# Patient Record
Sex: Male | Born: 1955
Health system: Southern US, Community
[De-identification: ages and names within clinical notes are randomized; demographics above are authoritative.]

## PROBLEM LIST (undated history)

## (undated) DIAGNOSIS — K56699 Other intestinal obstruction unspecified as to partial versus complete obstruction: Secondary | ICD-10-CM

## (undated) DIAGNOSIS — I471 Supraventricular tachycardia, unspecified: Secondary | ICD-10-CM

## (undated) DIAGNOSIS — I2510 Atherosclerotic heart disease of native coronary artery without angina pectoris: Secondary | ICD-10-CM

## (undated) DIAGNOSIS — Z973 Presence of spectacles and contact lenses: Secondary | ICD-10-CM

## (undated) DIAGNOSIS — Z8701 Personal history of pneumonia (recurrent): Secondary | ICD-10-CM

## (undated) DIAGNOSIS — K5790 Diverticulosis of intestine, part unspecified, without perforation or abscess without bleeding: Secondary | ICD-10-CM

## (undated) DIAGNOSIS — D126 Benign neoplasm of colon, unspecified: Secondary | ICD-10-CM

## (undated) DIAGNOSIS — J189 Pneumonia, unspecified organism: Secondary | ICD-10-CM

## (undated) DIAGNOSIS — M1990 Unspecified osteoarthritis, unspecified site: Secondary | ICD-10-CM

## (undated) DIAGNOSIS — M549 Dorsalgia, unspecified: Secondary | ICD-10-CM

## (undated) DIAGNOSIS — I10 Essential (primary) hypertension: Secondary | ICD-10-CM

## (undated) DIAGNOSIS — K5900 Constipation, unspecified: Secondary | ICD-10-CM

## (undated) DIAGNOSIS — E785 Hyperlipidemia, unspecified: Secondary | ICD-10-CM

## (undated) DIAGNOSIS — F419 Anxiety disorder, unspecified: Secondary | ICD-10-CM

## (undated) DIAGNOSIS — R011 Cardiac murmur, unspecified: Secondary | ICD-10-CM

## (undated) DIAGNOSIS — R0600 Dyspnea, unspecified: Secondary | ICD-10-CM

## (undated) DIAGNOSIS — I480 Paroxysmal atrial fibrillation: Secondary | ICD-10-CM

## (undated) DIAGNOSIS — C61 Malignant neoplasm of prostate: Secondary | ICD-10-CM

## (undated) DIAGNOSIS — H9190 Unspecified hearing loss, unspecified ear: Secondary | ICD-10-CM

## (undated) HISTORY — DX: Essential (primary) hypertension: I10

## (undated) HISTORY — DX: Supraventricular tachycardia, unspecified: I47.10

## (undated) HISTORY — PX: COLONOSCOPY: SHX174

## (undated) HISTORY — DX: Constipation, unspecified: K59.00

## (undated) HISTORY — DX: Supraventricular tachycardia: I47.1

## (undated) HISTORY — DX: Diverticulosis of intestine, part unspecified, without perforation or abscess without bleeding: K57.90

## (undated) HISTORY — DX: Other intestinal obstruction unspecified as to partial versus complete obstruction: K56.699

## (undated) HISTORY — DX: Paroxysmal atrial fibrillation: I48.0

## (undated) HISTORY — DX: Dorsalgia, unspecified: M54.9

## (undated) HISTORY — DX: Malignant neoplasm of prostate: C61

## (undated) HISTORY — PX: CARDIAC CATHETERIZATION: SHX172

## (undated) HISTORY — DX: Hyperlipidemia, unspecified: E78.5

## (undated) HISTORY — DX: Pneumonia, unspecified organism: J18.9

## (undated) HISTORY — DX: Benign neoplasm of colon, unspecified: D12.6

## (undated) HISTORY — PX: PROSTATECTOMY: SHX69

---

## 2002-01-25 ENCOUNTER — Encounter: Admission: RE | Admit: 2002-01-25 | Discharge: 2002-01-25 | Payer: Self-pay

## 2008-06-16 ENCOUNTER — Encounter: Admission: RE | Admit: 2008-06-16 | Discharge: 2008-06-16 | Payer: Self-pay | Admitting: Orthopedic Surgery

## 2009-06-24 ENCOUNTER — Emergency Department (HOSPITAL_COMMUNITY): Admission: EM | Admit: 2009-06-24 | Discharge: 2009-06-24 | Payer: Self-pay | Admitting: Emergency Medicine

## 2009-06-25 ENCOUNTER — Ambulatory Visit: Payer: Self-pay | Admitting: Cardiology

## 2009-06-25 ENCOUNTER — Encounter (INDEPENDENT_AMBULATORY_CARE_PROVIDER_SITE_OTHER): Payer: Self-pay | Admitting: Emergency Medicine

## 2009-06-25 ENCOUNTER — Observation Stay (HOSPITAL_COMMUNITY): Admission: EM | Admit: 2009-06-25 | Discharge: 2009-06-25 | Payer: Self-pay | Admitting: Emergency Medicine

## 2010-05-25 LAB — CBC
HCT: 49.6 % (ref 39.0–52.0)
Hemoglobin: 17.1 g/dL — ABNORMAL HIGH (ref 13.0–17.0)
MCHC: 34.5 g/dL (ref 30.0–36.0)
MCV: 85.7 fL (ref 78.0–100.0)
Platelets: 158 10*3/uL (ref 150–400)
RBC: 5.79 MIL/uL (ref 4.22–5.81)
RDW: 14.5 % (ref 11.5–15.5)
WBC: 9.9 10*3/uL (ref 4.0–10.5)

## 2010-05-25 LAB — CK TOTAL AND CKMB (NOT AT ARMC)
CK, MB: 1.8 ng/mL (ref 0.3–4.0)
CK, MB: 1.8 ng/mL (ref 0.3–4.0)
CK, MB: 2.3 ng/mL (ref 0.3–4.0)
CK, MB: 2.3 ng/mL (ref 0.3–4.0)
Relative Index: 1 (ref 0.0–2.5)
Relative Index: 1.3 (ref 0.0–2.5)
Relative Index: 1.3 (ref 0.0–2.5)
Relative Index: 1.8 (ref 0.0–2.5)
Total CK: 130 U/L (ref 7–232)
Total CK: 142 U/L (ref 7–232)
Total CK: 172 U/L (ref 7–232)
Total CK: 181 U/L (ref 7–232)

## 2010-05-25 LAB — POCT CARDIAC MARKERS
CKMB, poc: <1 ng/mL — ABNORMAL LOW (ref 1.0–8.0)
Myoglobin, poc: 95 ng/mL (ref 12–200)
Troponin i, poc: <0.05 ng/mL (ref 0.00–0.09)

## 2010-05-25 LAB — DIFFERENTIAL
Basophils Absolute: 0 10*3/uL (ref 0.0–0.1)
Basophils Relative: 0 % (ref 0–1)
Eosinophils Absolute: 0 10*3/uL (ref 0.0–0.7)
Eosinophils Relative: 0 % (ref 0–5)
Lymphocytes Relative: 20 % (ref 12–46)
Lymphs Abs: 2 10*3/uL (ref 0.7–4.0)
Monocytes Absolute: 0.4 10*3/uL (ref 0.1–1.0)
Monocytes Relative: 4 % (ref 3–12)
Neutro Abs: 7.4 10*3/uL (ref 1.7–7.7)
Neutrophils Relative %: 75 % (ref 43–77)

## 2010-05-25 LAB — BASIC METABOLIC PANEL WITH GFR
BUN: 18 mg/dL (ref 6–23)
GFR calc Af Amer: >60 mL/min (ref >60)
GFR calc non Af Amer: 56 mL/min — ABNORMAL LOW (ref >60)
Potassium: 3.4 meq/L — ABNORMAL LOW (ref 3.5–5.1)

## 2010-05-25 LAB — TROPONIN I
Troponin I: 0.01 ng/mL (ref 0.00–0.06)
Troponin I: 0.02 ng/mL (ref 0.00–0.06)
Troponin I: 0.03 ng/mL (ref 0.00–0.06)
Troponin I: 0.04 ng/mL (ref 0.00–0.06)

## 2010-05-25 LAB — BASIC METABOLIC PANEL
CO2: 24 mEq/L (ref 19–32)
Calcium: 8.9 mg/dL (ref 8.4–10.5)
Chloride: 102 mEq/L (ref 96–112)
Creatinine, Ser: 1.33 mg/dL (ref 0.4–1.5)
Glucose, Bld: 142 mg/dL — ABNORMAL HIGH (ref 70–99)
Sodium: 133 mEq/L — ABNORMAL LOW (ref 135–145)

## 2010-05-25 LAB — PROTIME-INR
INR: 1.03 (ref 0.00–1.49)
Prothrombin Time: 13.4 s (ref 11.6–15.2)

## 2010-05-25 LAB — APTT: aPTT: 26 seconds (ref 24–37)

## 2011-11-30 ENCOUNTER — Encounter (HOSPITAL_COMMUNITY): Payer: Self-pay | Admitting: *Deleted

## 2011-11-30 ENCOUNTER — Emergency Department (HOSPITAL_COMMUNITY): Payer: Self-pay

## 2011-11-30 ENCOUNTER — Emergency Department (HOSPITAL_COMMUNITY)
Admission: EM | Admit: 2011-11-30 | Discharge: 2011-11-30 | Disposition: A | Discharge status: Home / Self Care | Payer: No Typology Code available for payment source | Attending: Emergency Medicine | Admitting: Emergency Medicine

## 2011-11-30 DIAGNOSIS — Z79899 Other long term (current) drug therapy: Secondary | ICD-10-CM | POA: Insufficient documentation

## 2011-11-30 DIAGNOSIS — S139XXA Sprain of joints and ligaments of unspecified parts of neck, initial encounter: Secondary | ICD-10-CM | POA: Insufficient documentation

## 2011-11-30 DIAGNOSIS — S161XXA Strain of muscle, fascia and tendon at neck level, initial encounter: Secondary | ICD-10-CM

## 2011-11-30 DIAGNOSIS — R51 Headache: Secondary | ICD-10-CM | POA: Insufficient documentation

## 2011-11-30 DIAGNOSIS — Y92410 Unspecified street and highway as the place of occurrence of the external cause: Secondary | ICD-10-CM | POA: Insufficient documentation

## 2011-11-30 DIAGNOSIS — M542 Cervicalgia: Secondary | ICD-10-CM | POA: Insufficient documentation

## 2011-11-30 MED ORDER — KETOROLAC TROMETHAMINE 60 MG/2ML IM SOLN: 60.0000 mg | Freq: Once | INTRAMUSCULAR | Status: DC

## 2011-11-30 MED ORDER — KETOROLAC TROMETHAMINE 60 MG/2ML IM SOLN
60.0000 mg | Freq: Once | INTRAMUSCULAR | Status: AC
  Administered 2011-11-30: 60 mg via INTRAMUSCULAR
  Filled 2011-11-30: qty 2

## 2011-11-30 MED ORDER — TRAMADOL HCL 50 MG PO TABS: 50.0000 mg | ORAL_TABLET | Freq: Four times a day (QID) | ORAL | Status: DC | PRN

## 2011-11-30 MED ORDER — NAPROXEN 500 MG PO TABS: 500.0000 mg | ORAL_TABLET | Freq: Two times a day (BID) | ORAL | Status: DC

## 2011-11-30 MED ORDER — INDOMETHACIN ER 75 MG PO CPCR: 75.0000 mg | ORAL_CAPSULE | Freq: Two times a day (BID) | ORAL | Status: DC
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
## 2011-11-30 NOTE — ED Provider Notes (Signed)
After discharged by Dr. Hyacinth Meeker, the patient stated that he preferred indomethacin Naprosyn. He likes to take 75 mg extended release Indomethacin for his pain. Prescriptions written for extended release, indomethacin 75 mg, #30, and tramadol, #15.  Flint Melter, MD 11/30/11 [phone]

## 2013-08-06 ENCOUNTER — Other Ambulatory Visit: Payer: Self-pay | Admitting: Urology

## 2013-08-06 DIAGNOSIS — C61 Malignant neoplasm of prostate: Secondary | ICD-10-CM

## 2013-08-08 ENCOUNTER — Ambulatory Visit
Admission: RE | Admit: 2013-08-08 | Discharge: 2013-08-08 | Disposition: A | Discharge status: Home / Self Care | Payer: 59 | Source: Ambulatory Visit | Attending: Urology | Admitting: Urology

## 2013-08-08 DIAGNOSIS — C61 Malignant neoplasm of prostate: Secondary | ICD-10-CM

## 2013-11-13 DIAGNOSIS — M5116 Intervertebral disc disorders with radiculopathy, lumbar region: Secondary | ICD-10-CM | POA: Insufficient documentation

## 2013-12-05 DIAGNOSIS — M5126 Other intervertebral disc displacement, lumbar region: Secondary | ICD-10-CM | POA: Insufficient documentation

## 2014-01-08 DIAGNOSIS — M7060 Trochanteric bursitis, unspecified hip: Secondary | ICD-10-CM | POA: Insufficient documentation

## 2014-04-08 DIAGNOSIS — M4802 Spinal stenosis, cervical region: Secondary | ICD-10-CM | POA: Diagnosis present

## 2014-05-14 ENCOUNTER — Telehealth (HOSPITAL_COMMUNITY): Payer: Self-pay | Admitting: Internal Medicine

## 2014-05-14 NOTE — Telephone Encounter (Signed)
Received records from [facility] for appointment with Dr Debara Pickett on 06/06/14.  Records given to [facility] (medical records) for Dr Lysbeth Penner schedule on 06/06/14.  lp

## 2014-06-06 ENCOUNTER — Ambulatory Visit (INDEPENDENT_AMBULATORY_CARE_PROVIDER_SITE_OTHER): Payer: Self-pay | Admitting: Internal Medicine

## 2014-06-06 ENCOUNTER — Encounter: Payer: Self-pay | Admitting: Internal Medicine

## 2014-06-06 VITALS — BP 130/70 | HR 58 | Ht 71.25 in | Wt 206.2 lb

## 2014-06-06 DIAGNOSIS — R0600 Dyspnea, unspecified: Secondary | ICD-10-CM | POA: Insufficient documentation

## 2014-06-06 DIAGNOSIS — R0609 Other forms of dyspnea: Secondary | ICD-10-CM

## 2014-06-06 DIAGNOSIS — R011 Cardiac murmur, unspecified: Secondary | ICD-10-CM | POA: Insufficient documentation

## 2014-06-06 DIAGNOSIS — R0789 Other chest pain: Secondary | ICD-10-CM

## 2014-06-06 DIAGNOSIS — R079 Chest pain, unspecified: Secondary | ICD-10-CM

## 2014-06-06 DIAGNOSIS — R0602 Shortness of breath: Secondary | ICD-10-CM

## 2014-06-06 NOTE — Progress Notes (Signed)
 OFFICE NOTE  Chief Complaint:  Progressive DOE, chest pain  Primary Care Physician: Jacob Anes, MD  HPI:  Jacob Collier this pleasant 59 year old male who is coming referred to me for evaluation of chest pain. Past medical history significant for some chronic back pain and recent prostate cancer status post prostatectomy. He reports she's had progressive shortness of breath over the past several months which is out of proportion for what he had in the past. He was a former runner and owns gyms, he was also Midwife. He does have a history of steroid use in the past and is currently on steroid replacement. In 2011 he presented the ER with concern for MI. He underwent a stress echocardiogram was negative for ischemia. He does have a history of a soft heart murmur. He is described minimal chest discomfort but mostly progressive shortness of breath. He is a remote smoker in his teenage years but not any since that time. Family history is significant for heart disease in his grandfather had a heart attack in father who had a stroke.  PMHx:  Past Medical History  Diagnosis Date  . Prostate cancer   . Back pain     No past surgical history on file.  FAMHx:  Family History  Problem Relation Age of Onset  . Alzheimer's disease Mother   . Stroke Father   . Diabetes Sister   . Heart attack Maternal Grandmother     SOCHx:   reports that he has never smoked. He has never used smokeless tobacco. He reports that he does not drink alcohol or use illicit drugs.  ALLERGIES:  No Known Allergies  ROS: A comprehensive review of systems was negative except for: Respiratory: positive for dyspnea on exertion Cardiovascular: positive for chest pain  HOME MEDS: Current Outpatient Prescriptions  Medication Sig Dispense Refill  . acetaminophen (TYLENOL) 500 MG tablet Take 1,000 mg by mouth every 6 (six) hours as needed. pain    . indomethacin (INDOCIN) 50 MG capsule  Take 50 mg by mouth. 1-3 times daily as needed  2  . Omega-3 Fatty Acids (FISH OIL) 1000 MG CAPS Take 2 capsules by mouth daily.    [name] testosterone cypionate (DEPOTESTOTERONE CYPIONATE) 200 MG/ML injection Inject into the muscle once a week.    [name] VIAGRA 100 MG tablet as needed.     No current facility-administered medications for this visit.    LABS/IMAGING: No results found for this or any previous visit (from the past 48 hour(s)). No results found.  WEIGHTS: Wt Readings from Last 3 Encounters:  06/06/14 206 lb 3.2 oz (93.532 kg)    VITALS: BP 130/70 mmHg  Pulse 58  Ht 5' 11.25" (1.81 m)  Wt 206 lb 3.2 oz (93.532 kg)  BMI 28.55 kg/m2  EXAM: General appearance: alert and no distress Neck: no carotid bruit and no JVD Lungs: clear to auscultation bilaterally Heart: regular rate and rhythm, S1, S2 normal and systolic murmur: early systolic 2/6, crescendo at apex Abdomen: soft, non-tender; bowel sounds normal; no masses,  no organomegaly Extremities: edema no skin changes Pulses: 2+ and symmetric Skin: Skin color, texture, turgor normal. No rashes or lesions Neurologic: Grossly normal Psych: Normal  EKG: Sinus bradycardia 58  ASSESSMENT: 1. Progressive dyspnea and exertion 2. Minimal chest discomfort 3. History of steroid use  PLAN: 1.   [name] presents with some progressive shortness of breath. He had a stress echo 2011 which was normal. He has few cardiac risk factors  however is in the age group and does have a history of steroid use with competitive weightlifting in the past. There is a cardiac murmur which is present. I recommend an echocardiogram to rule out any cardiomyopathy or significant valvular disease. In addition, recommend an exercise treadmill stress test to evaluate for ischemia. He's inquired about a CT scan to evaluate for coronary artery calcium. This may be helpful if his stress test and echo are unrevealing. Should he have significant coronary calcium,  we gets really make the argument for intensifying his lipid therapy. He says his total cholesterol is between 202 and 220.   Plan to see him back to discuss results of his studies in a few weeks.  Thanks for the kind referral.  Jacob Casino, MD, [facility] Attending Cardiologist [facility]  , C 06/06/2014, 10:01 AM

## 2014-06-06 NOTE — Patient Instructions (Signed)
Your physician has requested that you have an exercise stress myoview. For further information please visit [email or web address]. Please follow instruction sheet, as given.  Your physician has requested that you have an echocardiogram. Echocardiography is a painless test that uses sound waves to create images of your heart. It provides your doctor with information about the size and shape of your heart and how well your heart's chambers and valves are working. This procedure takes approximately one hour. There are no restrictions for this procedure.  Your physician recommends that you schedule a follow-up appointment after your tests.

## 2014-06-12 DIAGNOSIS — M5116 Intervertebral disc disorders with radiculopathy, lumbar region: Secondary | ICD-10-CM | POA: Insufficient documentation

## 2014-07-09 ENCOUNTER — Telehealth (HOSPITAL_COMMUNITY): Payer: Self-pay

## 2014-07-09 NOTE — Telephone Encounter (Signed)
Encounter complete. 

## 2014-07-11 ENCOUNTER — Encounter (HOSPITAL_COMMUNITY): Payer: Self-pay

## 2014-07-11 ENCOUNTER — Ambulatory Visit (HOSPITAL_COMMUNITY): Payer: Self-pay

## 2014-07-11 ENCOUNTER — Other Ambulatory Visit (HOSPITAL_COMMUNITY): Payer: Self-pay

## 2014-07-25 ENCOUNTER — Ambulatory Visit: Payer: Self-pay | Admitting: Internal Medicine

## 2014-09-24 ENCOUNTER — Emergency Department (HOSPITAL_COMMUNITY): Payer: Commercial Managed Care - PPO

## 2014-09-24 ENCOUNTER — Encounter (HOSPITAL_COMMUNITY): Payer: Self-pay | Admitting: Radiology

## 2014-09-24 ENCOUNTER — Emergency Department (HOSPITAL_COMMUNITY)
Admission: EM | Admit: 2014-09-24 | Discharge: 2014-09-24 | Disposition: A | Discharge status: Home / Self Care | Payer: Commercial Managed Care - PPO | Attending: Emergency Medicine | Admitting: Emergency Medicine

## 2014-09-24 ENCOUNTER — Ambulatory Visit (INDEPENDENT_AMBULATORY_CARE_PROVIDER_SITE_OTHER): Payer: Commercial Managed Care - PPO | Admitting: Emergency Medicine

## 2014-09-24 VITALS — BP 133/82 | HR 61 | Temp 98.2°F | Resp 17

## 2014-09-24 DIAGNOSIS — R1032 Left lower quadrant pain: Secondary | ICD-10-CM | POA: Diagnosis present

## 2014-09-24 DIAGNOSIS — R197 Diarrhea, unspecified: Secondary | ICD-10-CM | POA: Diagnosis not present

## 2014-09-24 DIAGNOSIS — R0602 Shortness of breath: Secondary | ICD-10-CM | POA: Diagnosis not present

## 2014-09-24 DIAGNOSIS — M549 Dorsalgia, unspecified: Secondary | ICD-10-CM

## 2014-09-24 DIAGNOSIS — R110 Nausea: Secondary | ICD-10-CM | POA: Insufficient documentation

## 2014-09-24 DIAGNOSIS — Z8546 Personal history of malignant neoplasm of prostate: Secondary | ICD-10-CM | POA: Diagnosis not present

## 2014-09-24 DIAGNOSIS — R630 Anorexia: Secondary | ICD-10-CM | POA: Diagnosis not present

## 2014-09-24 DIAGNOSIS — K5790 Diverticulosis of intestine, part unspecified, without perforation or abscess without bleeding: Secondary | ICD-10-CM | POA: Insufficient documentation

## 2014-09-24 DIAGNOSIS — Z79899 Other long term (current) drug therapy: Secondary | ICD-10-CM | POA: Diagnosis not present

## 2014-09-24 DIAGNOSIS — G8929 Other chronic pain: Secondary | ICD-10-CM | POA: Insufficient documentation

## 2014-09-24 DIAGNOSIS — K5730 Diverticulosis of large intestine without perforation or abscess without bleeding: Secondary | ICD-10-CM

## 2014-09-24 DIAGNOSIS — R509 Fever, unspecified: Secondary | ICD-10-CM | POA: Insufficient documentation

## 2014-09-24 DIAGNOSIS — C61 Malignant neoplasm of prostate: Secondary | ICD-10-CM | POA: Insufficient documentation

## 2014-09-24 LAB — POCT CBC
Granulocyte percent: 65.9 % (ref 37–80)
HCT, POC: 53.9 % — AB (ref 43.5–53.7)
Hemoglobin: 17.8 g/dL (ref 14.1–18.1)
Lymph, poc: 2.2 (ref 0.6–3.4)
MCH, POC: 28.7 pg (ref 27–31.2)
MCHC: 33.1 g/dL (ref 31.8–35.4)
MCV: 86.9 fL (ref 80–97)
MID (cbc): 0.3 (ref 0–0.9)
MPV: 7.2 fL (ref 0–99.8)
POC Granulocyte: 4.9 (ref 2–6.9)
POC LYMPH PERCENT: 29.6 %L (ref 10–50)
POC MID %: 4.5 %M (ref 0–12)
Platelet Count, POC: 211 10*3/uL (ref 142–424)
RBC: 6.2 M/uL — AB (ref 4.69–6.13)
RDW, POC: 14 %
WBC: 7.4 10*3/uL (ref 4.6–10.2)

## 2014-09-24 LAB — COMPREHENSIVE METABOLIC PANEL
ALT: 21 U/L (ref 17–63)
AST: 20 U/L (ref 15–41)
Albumin: 3.8 g/dL (ref 3.5–5.0)
Alkaline Phosphatase: 61 U/L (ref 38–126)
Anion gap: 7 (ref 5–15)
BUN: 14 mg/dL (ref 6–20)
CO2: 24 mmol/L (ref 22–32)
Calcium: 8.4 mg/dL — ABNORMAL LOW (ref 8.9–10.3)
Chloride: 107 mmol/L (ref 101–111)
Creatinine, Ser: 1.06 mg/dL (ref 0.61–1.24)
GFR calc Af Amer: >60 mL/min (ref >60)
GFR calc non Af Amer: >60 mL/min (ref >60)
Glucose, Bld: 90 mg/dL (ref 65–99)
Potassium: 4.3 mmol/L (ref 3.5–5.1)
Sodium: 138 mmol/L (ref 135–145)
Total Bilirubin: 0.8 mg/dL (ref 0.3–1.2)
Total Protein: 6.8 g/dL (ref 6.5–8.1)

## 2014-09-24 LAB — CBC WITH DIFFERENTIAL/PLATELET
Basophils Absolute: 0 10*3/uL (ref 0.0–0.1)
Basophils Relative: 0 % (ref 0–1)
Eosinophils Absolute: 0.1 10*3/uL (ref 0.0–0.7)
Eosinophils Relative: 1 % (ref 0–5)
HCT: 49.2 % (ref 39.0–52.0)
Hemoglobin: 16.3 g/dL (ref 13.0–17.0)
Lymphocytes Relative: 23 % (ref 12–46)
Lymphs Abs: 1.4 10*3/uL (ref 0.7–4.0)
MCH: 28.9 pg (ref 26.0–34.0)
MCHC: 33.1 g/dL (ref 30.0–36.0)
MCV: 87.2 fL (ref 78.0–100.0)
Monocytes Absolute: 0.5 10*3/uL (ref 0.1–1.0)
Monocytes Relative: 8 % (ref 3–12)
Neutro Abs: 4.1 10*3/uL (ref 1.7–7.7)
Neutrophils Relative %: 68 % (ref 43–77)
Platelets: 217 10*3/uL (ref 150–400)
RBC: 5.64 MIL/uL (ref 4.22–5.81)
RDW: 13.9 % (ref 11.5–15.5)
WBC: 6.1 10*3/uL (ref 4.0–10.5)

## 2014-09-24 LAB — URINALYSIS, ROUTINE W REFLEX MICROSCOPIC
Bilirubin Urine: NEGATIVE
Glucose, UA: NEGATIVE mg/dL
Hgb urine dipstick: NEGATIVE
Ketones, ur: 15 mg/dL — AB
Nitrite: POSITIVE — AB
Protein, ur: NEGATIVE mg/dL
Specific Gravity, Urine: 1.016 (ref 1.005–1.030)
Urobilinogen, UA: 0.2 mg/dL (ref 0.0–1.0)
pH: 6 (ref 5.0–8.0)

## 2014-09-24 LAB — URINE MICROSCOPIC-ADD ON

## 2014-09-24 LAB — LIPASE, BLOOD: Lipase: 22 U/L (ref 22–51)

## 2014-09-24 MED ORDER — HYDROMORPHONE HCL 1 MG/ML IJ SOLN
1.0000 mg | Freq: Once | INTRAMUSCULAR | Status: AC
  Administered 2014-09-24: 1 mg via INTRAVENOUS
  Filled 2014-09-24: qty 1

## 2014-09-24 MED ORDER — CIPROFLOXACIN HCL 500 MG PO TABS: 500.0000 mg | ORAL_TABLET | Freq: Two times a day (BID) | ORAL | Status: DC

## 2014-09-24 MED ORDER — ONDANSETRON HCL 4 MG/2ML IJ SOLN
4.0000 mg | Freq: Once | INTRAMUSCULAR | Status: AC
  Administered 2014-09-24: 4 mg via INTRAVENOUS
  Filled 2014-09-24: qty 2

## 2014-09-24 MED ORDER — IOHEXOL 300 MG/ML  SOLN
50.0000 mL | Freq: Once | INTRAMUSCULAR | Status: AC | PRN
  Administered 2014-09-24: 50 mL via ORAL

## 2014-09-24 MED ORDER — METRONIDAZOLE 500 MG PO TABS: 500.0000 mg | ORAL_TABLET | Freq: Two times a day (BID) | ORAL | Status: DC

## 2014-09-24 MED ORDER — HYDROCODONE-ACETAMINOPHEN 5-325 MG PO TABS: 2.0000 | ORAL_TABLET | ORAL | Status: DC | PRN

## 2014-09-24 MED ORDER — SODIUM CHLORIDE 0.9 % IV BOLUS (SEPSIS)
1000.0000 mL | Freq: Once | INTRAVENOUS | Status: AC
  Administered 2014-09-24: 1000 mL via INTRAVENOUS

## 2014-09-24 MED ORDER — IOHEXOL 300 MG/ML  SOLN
100.0000 mL | Freq: Once | INTRAMUSCULAR | Status: AC | PRN
  Administered 2014-09-24: 100 mL via INTRAVENOUS

## 2014-09-24 NOTE — ED Provider Notes (Signed)
CSN: [id number]     Arrival date & time 09/24/14  0927 History   First MD Initiated Contact with Patient 09/24/14 0932     Chief Complaint  Patient presents with  . Abdominal Pain  . Shortness of Breath     (Consider location/radiation/quality/duration/timing/severity/associated sxs/prior Treatment) Patient is a 59 y.o. male presenting with abdominal pain and shortness of breath. The history is provided by the patient. No language interpreter was used.  Abdominal Pain Associated symptoms: chills, diarrhea, fever and nausea   Associated symptoms: no shortness of breath and no vomiting   Shortness of Breath Associated symptoms: abdominal pain and fever   Associated symptoms: no vomiting    Mr. Labreck is a 59 year old male with a history of prostate cancer and prostatectomy who presents for constant left lower abdomen abdominal pain that began 4 days ago. He states it is gradually worsened with nausea yesterday and sharp pains today. He states he has also had fever and chills last night. He mention that he has been constipated on and off for the last 8 weeks and is now having diarrhea without blood. He was seen at urgent care this morning and was evaluated and sent to the ED. He denies taking anything for pain the last 4 days. He states he has also had a decreased appetite and has not eaten much since Monday. He denies any chest pain, cough, shortness of breath, vomiting, dysuria, hematuria, urinary frequency.  Past Medical History  Diagnosis Date  . Back pain   . Prostate cancer dx'd 07/2013   No past surgical history on file. Family History  Problem Relation Age of Onset  . Alzheimer's disease Mother   . Stroke Father   . Diabetes Sister   . Heart attack Maternal Grandmother    History  Substance Use Topics  . Smoking status: Never Smoker   . Smokeless tobacco: Never Used  . Alcohol Use: No    Review of Systems  Constitutional: Positive for fever and chills.  Respiratory:  Negative for shortness of breath.   Gastrointestinal: Positive for nausea, abdominal pain and diarrhea. Negative for vomiting, blood in stool and abdominal distention.  All other systems reviewed and are negative.     Allergies  Review of patient's allergies indicates no known allergies.  Home Medications   Prior to Admission medications   Medication Sig Start Date End Date Taking? Authorizing Provider  acetaminophen (TYLENOL) 500 MG tablet Take 1,000 mg by mouth every 6 (six) hours as needed. pain   Yes Historical Provider, MD  clonazePAM (KLONOPIN) 1 MG tablet Take 1 mg by mouth 2 (two) times daily as needed for anxiety.    Yes Historical Provider, MD  indomethacin (INDOCIN SR) 75 MG CR capsule Take 75 mg by mouth at bedtime. 08/20/14  Yes Historical Provider, MD  Multiple Vitamins-Minerals (MULTIVITAMIN ADULT PO) Take 1 tablet by mouth daily.   Yes Historical Provider, MD  Omega-3 Fatty Acids (FISH OIL) 1000 MG CAPS Take 2,000 mg by mouth daily.    Yes Historical Provider, MD  testosterone cypionate (DEPOTESTOTERONE CYPIONATE) 200 MG/ML injection Inject into the muscle once a week. 06/05/14  Yes Historical Provider, MD  VIAGRA 100 MG tablet Take 100 mg by mouth daily as needed for erectile dysfunction.  03/02/14  Yes Historical Provider, MD  ciprofloxacin (CIPRO) 500 MG tablet Take 1 tablet (500 mg total) by mouth every 12 (twelve) hours. 09/24/14   Dayami Taitt Patel-Mills, PA-C  HYDROcodone-acetaminophen (NORCO/VICODIN) 5-325 MG per tablet Take 2  tablets by mouth every 4 (four) hours as needed. 09/24/14   Carleigh Buccieri Patel-Mills, PA-C  metroNIDAZOLE (FLAGYL) 500 MG tablet Take 1 tablet (500 mg total) by mouth 2 (two) times daily. 09/24/14   Amous Crewe Patel-Mills, PA-C   BP 151/82 mmHg  Pulse 56  Temp(Src) 98.7 F (37.1 C) (Oral)  Resp 20  SpO2 98% Physical Exam  Constitutional: He is oriented to person, place, and time. He appears well-developed and well-nourished.  HENT:  Head: Normocephalic and  atraumatic.  Eyes: Conjunctivae are normal.  Neck: Normal range of motion. Neck supple.  Cardiovascular: Normal rate, regular rhythm and normal heart sounds.   Pulmonary/Chest: Effort normal and breath sounds normal. No respiratory distress. He has no wheezes. He has no rales. He exhibits no tenderness.  Abdominal: Soft. He exhibits no distension and no mass. There is tenderness in the left lower quadrant. There is no rigidity, no rebound and no guarding.  Vertical surgical scar noted below the umbilicus. Moderate tenderness to palpation of the left lower quadrant. No guarding or rebound.  Musculoskeletal: Normal range of motion. He exhibits no edema or tenderness.  Neurological: He is alert and oriented to person, place, and time.  Skin: Skin is warm and dry.  Psychiatric: He has a normal mood and affect. His behavior is normal.  Nursing note and vitals reviewed.   ED Course  Procedures (including critical care time) Labs Review Labs Reviewed  URINALYSIS, ROUTINE W REFLEX MICROSCOPIC (NOT AT [facility]) - Abnormal; Notable for the following:    APPearance CLOUDY (*)    Ketones, ur 15 (*)    Nitrite POSITIVE (*)    Leukocytes, UA SMALL (*)    All other components within normal limits  URINE MICROSCOPIC-ADD ON - Abnormal; Notable for the following:    Bacteria, UA MANY (*)    All other components within normal limits  COMPREHENSIVE METABOLIC PANEL - Abnormal; Notable for the following:    Calcium 8.4 (*)    All other components within normal limits  URINE CULTURE  CBC WITH DIFFERENTIAL/PLATELET  LIPASE, BLOOD    Imaging Review Ct Abdomen Pelvis W Contrast  09/24/2014   CLINICAL DATA:  59 year old male with a history of left lower quadrant pain since Monday. Progressed worsening.  EXAM: CT ABDOMEN AND PELVIS WITH CONTRAST  TECHNIQUE: Multidetector CT imaging of the abdomen and pelvis was performed using the standard protocol following bolus administration of intravenous contrast.   CONTRAST:  147mL OMNIPAQUE IOHEXOL 300 MG/ML  SOLN  COMPARISON:  CT 10/08/2012, 08/08/2013, MRI 07/16/2013  FINDINGS: Lower chest:  Unremarkable appearance of the soft tissues of the chest wall.  Heart size within normal limits.  No pericardial fluid/thickening.  No lower mediastinal adenopathy.  Unremarkable appearance of the distal esophagus.  No hiatal hernia.  No confluent airspace disease, pleural fluid, or pneumothorax within visualized lung. Atelectasis at the bilateral lung bases.  Abdomen/pelvis:  Unremarkable appearance of liver and spleen.  Unremarkable appearance of bilateral adrenal glands.  No peripancreatic or pericholecystic fluid or inflammatory changes.  No radio-opaque gallstones.  No intrahepatic or extrahepatic biliary ductal dilatation.  No intra-peritoneal free air or significant free-fluid.  No abnormally dilated small bowel or colon. No transition point. No inflammatory changes of the mesenteries.  Normal appendix.  Extensive diverticular changes of the colon, predominantly of the descending colon and sigmoid. The configuration is unchanged from the comparison. No inflammatory changes are within the mesenteric to suggest acute changes. No fluid or free air.  Right Kidney/Ureter:  No hydronephrosis. No nephrolithiasis. No perinephric stranding. Unremarkable course of the right ureter. Unchanged right kidney cyst.  Left Kidney/Ureter:  No hydronephrosis. No nephrolithiasis. No perinephric stranding.  Unremarkable course of the left ureter.  Urinary bladder on reformatted coronal images demonstrates a peaked appearance at the superior dome on the left, where there is extension of the soft tissue towards diverticular changes. No contrast within the lumen of the urinary bladder, with no extraluminal contrast.  Surgical changes of prostatectomy.  No significant vascular calcification. No aneurysm or periaortic fluid identified.  Musculoskeletal:  No displaced fracture.  Multilevel degenerative  changes of the visualized spine with advanced disc degeneration of L5-S1, similar to comparison.  IMPRESSION: No acute finding to account for the patient's symptoms.  There are extensive diverticular changes, as have been demonstrated previously, with no CT evidence of acute diverticulitis. Chronic wall thickening in the region of the sigmoid colon. The coronal images demonstrate angular peaking of the superior left dome of the urinary bladder towards this diverticular change, which suggests scarring/traction in the setting of chronic inflammation. Referral for surgical evaluation and/or GI evaluation may be useful if not already performed, in particular to rule out a possibility of tumor at this location.  These results were called by telephone at the time of interpretation on 09/24/2014 at 2:06 pm to Dr. Orvil Feil PATEL-MILLS , who verbally acknowledged these results.  Signed,  Dulcy Fanny. Earleen Newport, DO  [facility]  [facility]   Electronically Signed   By: Corrie Mckusick D.O.   On: 09/24/2014 14:09     EKG Interpretation None      MDM   Final diagnoses:  Abdominal pain, left lower quadrant  Patient presents for left lower quadrant abdominal pain. He is in no acute distress. His labs are unremarkable. He has no urinary symptoms. Dr. Earleen Newport from radiology called me in regards to his CT abdomen. He stated that the urinary bladder was attached to the sigmoid colon due to chronic inflammation which she believed was indolent diverticulitis. He stated there was no abscess and no adenopathy to suggest a tumor but did suggest that the patient would need an outpatient colonscopy. I discussed the findings with the patient and gave him a GI referral. The patient was symptomatic and I put him on Flagyl, Cipro, hydrocodone. I also gave him a work note.  He agrees with the plan and there were no unanswered questions.      Ottie Glazier, PA-C 09/24/14 1518  Virgel Manifold, MD 09/25/14 1425

## 2014-09-24 NOTE — ED Notes (Signed)
Bed: WA18 Expected date:  Expected time:  Means of arrival:  Comments: EMS-abdominal pain 

## 2014-09-24 NOTE — Patient Instructions (Signed)
Driving directions to [facility] 3D2D  [phone]  - more info    [place], [place] [address]     1. Head south on [address] toward [address]      0.5 mi    2. Sharp left onto [address]      0.6 mi    3. Turn left onto the [address] ramp      0.2 mi    4. Merge onto [address]      3.0 mi    5. Continue straight to stay on [address]      0.4 mi    6. Slight left to stay on [address]      1.2 mi    7. Turn right onto [address]      0.1 mi    8. Turn left onto [address]      361 ft    9. Take the 1st left onto [address] will be on the right    Driving directions to [facility] 3D2D  [phone]  - more info    [place], [place] [address]     1. Head north on [address] toward [employer]      344 ft    2. Turn right onto [address]      0.3 mi    3. Slight left to stay on [address]      1.7 mi    4. Turn left onto [address] will be on the right     0.6 mi     [facility]  [place]   Driving directions to [place], [place], [place] [address] 3D2D  - more info    [place], [place] [address]     1. Head south on [address] toward [address]      0.5 mi    2. Sharp left onto [address]      0.6 mi    3. Turn left onto the [address] ramp      0.2 mi    4. Merge onto [address]      3.0 mi    5. Continue straight to stay on [address]      0.4 mi    6. Slight left to stay on [facility]  Destination will be on the right     1.0 mi     [place], [place] [address]   Driving directions to [facility] 3D2D  [phone]  - more info    [place], [place] [address]     1. Head south on [address] toward [address]      0.5 mi    2. Sharp left onto [address]      0.6 mi    3. Turn left onto the [address] ramp      0.2 mi    4. Merge onto  [address]      3.0 mi    5. Slight right toward [employer] (signs for [address])      0.2 mi    6. Take the ramp to [facility]  [address]      338 ft    7. Turn left onto [address]      0.3 mi    8. Turn left onto [address] will be on the right     0.2 mi     [facility]  [place] to [facility] 3D2D  - more info    [place], [place] [address]     1. Head south on [address] toward [address]      0.7 mi    2. Turn left onto [address]      0.4 mi    3. Take the 3rd right onto [address]      1.1 mi    4. Take the [address] ramp to [facility]      0.2 mi    5. Merge onto [address]      3.7 mi    6. Take [address] for [address] toward [place] [address]      0.3 mi    7. Keep left at the fork, follow signs for [address]      381 ft    8. Keep left at the fork, follow signs for [facility] S/[place]      302 ft    9. Turn left onto [address]      2.6 mi    10. Turn right onto [address] will be on the left     0.2 mi     [employer]

## 2014-09-24 NOTE — ED Notes (Signed)
Pt to CT

## 2014-09-24 NOTE — Progress Notes (Signed)
Jacob Collier  MRN: [id number] DOB: 1955-05-17  Subjective:  Pt presents to clinic with LLQ pain (10/10) that started 2 days ago that has worsened and then yesterday afternoon he started to feel SOB without any cold symptoms of cough.  He is having no CP associated with the pain or the SOB since it started.  He has had decreased appetite for the last 2 days - only eaten an egg in 2 days and had minimal to drink.  He has not had any pain medications or other medications for the pain because he was not sure what he needed to take.  The SOB is worse outside in the hot humid air.  Once he was in our building and in the cool air his SOB was better.  He has problems with anxiety and stress and that has caused panic but he is not sure that is causing this at this time.  Last year - prostate cancer - cancer - problems with constipation since then - he has had no diarrhea since the onset of this problem.  Diagnosis with diverticulosis - by CT 1-2 years ago - never had a colonoscopy (PCP is process of getting that set up) - has been treated for what sounds like diverticulitis and treated with abx but a scan was not done at the time - he did have a CT scan last year  That showed diverticulosis.  Bartko - planned to give him back injection but he was scheduled wrong so it did not happen - he has had no pain medications.  Patient Active Problem List   Diagnosis Date Noted  . Diverticulosis 09/24/2014  . DOE (dyspnea on exertion) 06/06/2014  . Murmur 06/06/2014  . Chest pain 06/06/2014    Current Outpatient Prescriptions on File Prior to Visit  Medication Sig Dispense Refill  . acetaminophen (TYLENOL) 500 MG tablet Take 1,000 mg by mouth every 6 (six) hours as needed. pain    . indomethacin (INDOCIN) 50 MG capsule Take 50 mg by mouth. 1-3 times daily as needed  2  . Omega-3 Fatty Acids (FISH OIL) 1000 MG CAPS Take 2 capsules by mouth daily.    [name] testosterone cypionate (DEPOTESTOTERONE CYPIONATE) 200  MG/ML injection Inject into the muscle once a week.    [name] VIAGRA 100 MG tablet as needed.     No current facility-administered medications on file prior to visit.    No Known Allergies  Review of Systems  Constitutional: Positive for fever (subjective) and chills.  HENT: Negative.   Gastrointestinal: Positive for nausea, abdominal pain (LLQ - getting worse) and constipation (no change for him since his prostate surgery 1 years ago).  Genitourinary:       No change from his baseline.   Objective:  BP 133/82 mmHg  Pulse 61  Temp(Src) 98.2 F (36.8 C) (Oral)  Resp 17  SpO2 99%  Pt was brought back emergently for evaluation from the lobby.  Physical Exam  Constitutional: He is oriented to person, place, and time and well-developed, well-nourished, and in no distress.  HENT:  Head: Normocephalic and atraumatic.  Right Ear: External ear normal.  Left Ear: External ear normal.  Eyes: Conjunctivae are normal.  Neck: Normal range of motion.  Cardiovascular: Normal rate, regular rhythm and normal heart sounds.   Pulmonary/Chest: Effort normal and breath sounds normal. He has no wheezes.  Pt appears uncomfortable when he was 1st evaluated without the ability to talk in full sentences but once he is able  to lay down his breathing returns to normal and he is able to speak in full sentences.  Abdominal: Soft. Bowel sounds are normal. There is tenderness in the left lower quadrant. There is guarding (mild with LLQ palpation). There is no rigidity, no rebound and no CVA tenderness.  Neurological: He is alert and oriented to person, place, and time. Gait normal.  Skin: Skin is warm and dry.  Psychiatric: Mood, memory, affect and judgment normal.   Results for orders placed or performed in visit on 09/24/14  POCT CBC  Result Value Ref Range   WBC 7.4 4.6 - 10.2 K/uL   Lymph, poc 2.2 0.6 - 3.4   POC LYMPH PERCENT 29.6 10 - 50 %L   MID (cbc) 0.3 0 - 0.9   POC MID % 4.5 0 - 12 %M   POC  Granulocyte 4.9 2 - 6.9   Granulocyte percent 65.9 37 - 80 %G   RBC 6.20 (A) 4.69 - 6.13 M/uL   Hemoglobin 17.8 14.1 - 18.1 g/dL   HCT, POC 53.9 (A) 43.5 - 53.7 %   MCV 86.9 80 - 97 fL   MCH, POC 28.7 27 - 31.2 pg   MCHC 33.1 31.8 - 35.4 g/dL   RDW, POC 14.0 %   Platelet Count, POC 211 142 - 424 K/uL   MPV 7.2 0 - 99.8 fL   EKG - no acute changes - unchanged since 4/16 Assessment and Plan :  SOB (shortness of breath) - Plan: EKG 12-Lead  LLQ abdominal pain - Plan: POCT CBC  Diverticulosis of large intestine without hemorrhage   Pt has CT diagnosis of diverticulosis and he has classic symptoms of diverticulosis though his white count is normal at this time.  I suspect his pain and worry about his pain is what caused his SOB because he resolved while he was here in the clinic.  His EKG is unchanged from 06/2014.  He is dehydrated and as a result we started an IV and sent him by EMS to the ED for hydration and pain control.  He agreed with this plan.  D/w Dr Everlene Farrier.  Windell Hummingbird PA-C  [facility] 09/24/2014 8:59 AM

## 2014-09-24 NOTE — ED Notes (Signed)
Pt alert and oriented x4. Respirations even and unlabored, bilateral symmetrical rise and fall of chest. Skin warm and dry. In no acute distress. Denies needs.   

## 2014-09-24 NOTE — ED Notes (Signed)
Pt escorted to discharge window. Pt verbalized understanding discharge instructions. In no acute distress.  

## 2014-09-24 NOTE — ED Notes (Addendum)
Hx of diverticulosis.Per ems pt is from urgent care, LLQ pain x2 days, decreased appetite, and SOB, lung sounds clear. Pain 9/10. Able to speak in full sentences. urgent care sent him to ED for possible diverticulitis. , hx of prostate cancer, 6 month check up was clean.   20 G R AC, 791ml NS infused.   Upon rn assessment, pt reports hx of diverticulosis, reports LLQ abd pain since Monday 7/18, constipation since Monday, denies flatus since Monday. Pain 9/10. Decreased appetite since Monday. Denies blood in urine or stool. Bowel sounds present. Reports SOB started this morning. Lung sounds clear. Able to speak in full sentences. Pt has recently not been taking pain medications because he is scheduled to have epidural for chronic back issues.

## 2014-09-24 NOTE — Discharge Instructions (Signed)
Follow-up with gastroenterology for colonoscopy. Return for fever or increased abdominal pain. Drink plenty of fluids. Eat a bland diet for the next 48 hours.

## 2014-09-26 LAB — URINE CULTURE
Culture: >=100000
Special Requests: NORMAL

## 2014-09-28 ENCOUNTER — Telehealth (HOSPITAL_COMMUNITY): Payer: Self-pay

## 2014-09-28 NOTE — Telephone Encounter (Signed)
Post ED Visit - Positive Culture Follow-up  Culture report reviewed by antimicrobial stewardship pharmacist: []  Wes [place], Pharm.D., BCPS []  Heide Guile, Pharm.D., BCPS [x]  Alycia Rossetti, Pharm.D., BCPS []  [place], Pharm.D., BCPS, AAHIVP []  Legrand Como, Pharm.D., BCPS, AAHIVP []  Isac Sarna, Pharm.D., BCPS  Positive Urine culture, >/= 100,000 colonies -> Klebsiella Pneumoniae Treated with Ciprofloxacin, organism sensitive to the same and no further patient follow-up is required at this time.  Dortha Kern 09/28/2014, 6:43 AM

## 2015-06-08 ENCOUNTER — Other Ambulatory Visit: Payer: Self-pay | Admitting: Physician Assistant

## 2015-06-17 ENCOUNTER — Ambulatory Visit
Admission: RE | Admit: 2015-06-17 | Discharge: 2015-06-17 | Disposition: A | Discharge status: Home / Self Care | Payer: BLUE CROSS/BLUE SHIELD | Source: Ambulatory Visit | Attending: Orthopaedic Surgery | Admitting: Orthopaedic Surgery

## 2015-06-17 ENCOUNTER — Other Ambulatory Visit: Payer: Self-pay | Admitting: Orthopaedic Surgery

## 2015-06-17 DIAGNOSIS — M25552 Pain in left hip: Secondary | ICD-10-CM

## 2015-06-17 MED ORDER — METHYLPREDNISOLONE ACETATE 40 MG/ML INJ SUSP (RADIOLOG
120.0000 mg | Freq: Once | INTRAMUSCULAR | Status: AC
  Administered 2015-06-17: 120 mg via INTRA_ARTICULAR

## 2015-06-17 MED ORDER — IOHEXOL 180 MG/ML  SOLN
1.0000 mL | Freq: Once | INTRAMUSCULAR | Status: AC | PRN
Start: 2015-06-17 — End: 2015-06-17
  Administered 2015-06-17: 1 mL via INTRA_ARTICULAR

## 2015-06-19 ENCOUNTER — Inpatient Hospital Stay: Admit: 2015-06-19 | Payer: Self-pay | Admitting: Orthopaedic Surgery

## 2015-06-19 SURGERY — ARTHROPLASTY, HIP, TOTAL, ANTERIOR APPROACH
Anesthesia: Choice | Laterality: Left

## 2015-10-27 ENCOUNTER — Other Ambulatory Visit: Payer: Self-pay | Admitting: Physician Assistant

## 2015-11-02 ENCOUNTER — Inpatient Hospital Stay (HOSPITAL_COMMUNITY)
Admission: RE | Admit: 2015-11-02 | Discharge: 2015-11-02 | Disposition: A | Discharge status: Home / Self Care | Payer: BLUE CROSS/BLUE SHIELD | Source: Ambulatory Visit

## 2015-11-02 ENCOUNTER — Encounter (HOSPITAL_COMMUNITY): Payer: Self-pay

## 2015-11-02 NOTE — Pre-Procedure Instructions (Signed)
Jacob Collier  11/02/2015      [facility] [address] [place] [address] Phone: [phone] Fax: [phone]  [id number] - [place], [place] [address] Phone: [phone] Fax: [phone]    Your procedure is scheduled on 11/10/15  Report to [facility] Admitting at 1230 A.M.  Call this number if you have problems the morning of surgery:  [phone]   Remember:  Do not eat food or drink liquids after midnight.  Take these medicines the morning of surgery with A SIP OF WATER clonazepam,(klonopin),hydrocodone if needed  STOP all herbel meds, nsaids (aleve,naproxen,advil,ibuprofen) 5 days prior to surgery including vitamins,fish oil indomethecin starting 11/05/15   Do not wear jewelry, make-up or nail polish.  Do not wear lotions, powders, or perfumes, or deoderant.  Do not shave 48 hours prior to surgery.  Men may shave face and neck.  Do not bring valuables to the hospital.  [facility] is not responsible for any belongings or valuables.  Contacts, dentures or bridgework may not be worn into surgery.  Leave your suitcase in the car.  After surgery it may be brought to your room.  For patients admitted to the hospital, discharge time will be determined by your treatment team.  Patients discharged the day of surgery will not be allowed to drive home.   Name and phone number of your driver:    Special instructions:   Special Instructions: [facility] - Preparing for Surgery  Before surgery, you can play an important role.  Because skin is not sterile, your skin needs to be as free of germs as possible.  You can reduce the number of germs on you skin by washing with CHG (chlorahexidine gluconate) soap before surgery.  CHG is an antiseptic cleaner which kills germs and bonds with the skin to continue killing germs even after  washing.  Please DO NOT use if you have an allergy to CHG or antibacterial soaps.  If your skin becomes reddened/irritated stop using the CHG and inform your nurse when you arrive at Short Stay.  Do not shave (including legs and underarms) for at least 48 hours prior to the first CHG shower.  You may shave your face.  Please follow these instructions carefully:   1.  Shower with CHG Soap the night before surgery and the morning of Surgery.  2.  If you choose to wash your hair, wash your hair first as usual with your normal shampoo.  3.  After you shampoo, rinse your hair and body thoroughly to remove the Shampoo.  4.  Use CHG as you would any other liquid soap.  You can apply chg directly  to the skin and wash gently with scrungie or a clean washcloth.  5.  Apply the CHG Soap to your body ONLY FROM THE NECK DOWN.  Do not use on open wounds or open sores.  Avoid contact with your eyes ears, mouth and genitals (private parts).  Wash genitals (private parts)       with your normal soap.  6.  Wash thoroughly, paying special attention to the area where your surgery will be performed.  7.  Thoroughly rinse your body with warm water from the neck down.  8.  DO NOT shower/wash with your normal soap after using and rinsing off the CHG Soap.  9.  Pat yourself  dry with a clean towel.            10.  Wear clean pajamas.            11.  Place clean sheets on your bed the night of your first shower and do not sleep with pets.  Day of Surgery  Do not apply any lotions/deodorants the morning of surgery.  Please wear clean clothes to the hospital/surgery center.  Please read over the  fact sheets that you were given.

## 2015-11-05 ENCOUNTER — Encounter (HOSPITAL_COMMUNITY): Payer: Self-pay

## 2015-11-05 ENCOUNTER — Encounter (HOSPITAL_COMMUNITY)
Admission: RE | Admit: 2015-11-05 | Discharge: 2015-11-05 | Disposition: A | Discharge status: Home / Self Care | Payer: BLUE CROSS/BLUE SHIELD | Source: Ambulatory Visit | Attending: Orthopaedic Surgery | Admitting: Orthopaedic Surgery

## 2015-11-05 DIAGNOSIS — M1612 Unilateral primary osteoarthritis, left hip: Secondary | ICD-10-CM | POA: Insufficient documentation

## 2015-11-05 DIAGNOSIS — Z01818 Encounter for other preprocedural examination: Secondary | ICD-10-CM | POA: Insufficient documentation

## 2015-11-05 DIAGNOSIS — Z01812 Encounter for preprocedural laboratory examination: Secondary | ICD-10-CM | POA: Insufficient documentation

## 2015-11-05 DIAGNOSIS — R001 Bradycardia, unspecified: Secondary | ICD-10-CM | POA: Diagnosis not present

## 2015-11-05 HISTORY — DX: Unspecified hearing loss, unspecified ear: H91.90

## 2015-11-05 HISTORY — DX: Presence of spectacles and contact lenses: Z97.3

## 2015-11-05 HISTORY — DX: Personal history of pneumonia (recurrent): Z87.01

## 2015-11-05 HISTORY — DX: Unspecified osteoarthritis, unspecified site: M19.90

## 2015-11-05 HISTORY — DX: Cardiac murmur, unspecified: R01.1

## 2015-11-05 LAB — BASIC METABOLIC PANEL WITH GFR
BUN: 15 mg/dL (ref 6–20)
Calcium: 9.1 mg/dL (ref 8.9–10.3)
Creatinine, Ser: 1.16 mg/dL (ref 0.61–1.24)
GFR calc non Af Amer: >60 mL/min (ref >60)
Glucose, Bld: 87 mg/dL (ref 65–99)
Potassium: 3.9 mmol/L (ref 3.5–5.1)
Sodium: 138 mmol/L (ref 135–145)

## 2015-11-05 LAB — CBC
HCT: 43.8 % (ref 39.0–52.0)
Hemoglobin: 14.3 g/dL (ref 13.0–17.0)
MCH: 29.4 pg (ref 26.0–34.0)
MCHC: 32.6 g/dL (ref 30.0–36.0)
MCV: 90.1 fL (ref 78.0–100.0)
Platelets: 144 10*3/uL — ABNORMAL LOW (ref 150–400)
RBC: 4.86 MIL/uL (ref 4.22–5.81)
RDW: 14.2 % (ref 11.5–15.5)
WBC: 5 10*3/uL (ref 4.0–10.5)

## 2015-11-05 LAB — BASIC METABOLIC PANEL
Anion gap: 5 (ref 5–15)
CO2: 24 mmol/L (ref 22–32)
Chloride: 109 mmol/L (ref 101–111)
GFR calc Af Amer: >60 mL/min (ref >60)

## 2015-11-05 LAB — SURGICAL PCR SCREEN
MRSA, PCR: NEGATIVE
Staphylococcus aureus: NEGATIVE

## 2015-11-05 NOTE — Pre-Procedure Instructions (Signed)
Jacob Collier  11/05/2015      [name] [address], [place] [address] Phone: [phone] Fax: [phone]  [id number] - [place], [place] [address] Phone: [phone] Fax: [phone]    Your procedure is scheduled on Tuesday, September 5th, 2017.  Report to [facility] Admitting at 12:30 P.M.   Call this number if you have problems the morning of surgery:  [phone]   Remember:  Do not eat food or drink liquids after midnight.   Take these medicines the morning of surgery with A SIP OF WATER: Acetaminophen (Tylenol) OR Tylenol #3 if needed, Clonazepam (Klonopin) if needed, Benadryl if needed, Lyrica.  Stop taking: Indomethacin (Indocin), Aspirin, NSAIDs, Aleve, Naproxen, Ibuprofen, Advil, Motrin, BCs, Goody's, Fish oil, all herbal medications, and all vitamins.     Do not wear jewelry.  Do not wear lotions, powders, or colognes, or deoderant.  Men may shave face and neck.  Do not bring valuables to the hospital.  [facility] is not responsible for any belongings or valuables.  Contacts, dentures or bridgework may not be worn into surgery.  Leave your suitcase in the car.  After surgery it may be brought to your room.  For patients admitted to the hospital, discharge time will be determined by your treatment team.  Patients discharged the day of surgery will not be allowed to drive home.   Special instructions:  Preparing for Surgery.   Please read over the following fact sheets that you were given. MRSA Information    [facility]- Preparing For Surgery  Before surgery, you can play an important role. Because skin is not sterile, your skin needs to be as free of germs as possible. You can reduce the number of germs on your skin by washing with CHG (chlorahexidine gluconate) Soap before surgery.  CHG is an  antiseptic cleaner which kills germs and bonds with the skin to continue killing germs even after washing.  Please do not use if you have an allergy to CHG or antibacterial soaps. If your skin becomes reddened/irritated stop using the CHG.  Do not shave (including legs and underarms) for at least 48 hours prior to first CHG shower. It is OK to shave your face.  Please follow these instructions carefully.   1. Shower the NIGHT BEFORE SURGERY and the MORNING OF SURGERY with CHG.   2. If you chose to wash your hair, wash your hair first as usual with your normal shampoo.  3. After you shampoo, rinse your hair and body thoroughly to remove the shampoo.  4. Use CHG as you would any other liquid soap. You can apply CHG directly to the skin and wash gently with a scrungie or a clean washcloth.   5. Apply the CHG Soap to your body ONLY FROM THE NECK DOWN.  Do not use on open wounds or open sores. Avoid contact with your eyes, ears, mouth and genitals (private parts). Wash genitals (private parts) with your normal soap.  6. Wash thoroughly, paying special attention to the area where your surgery will be performed.  7. Thoroughly rinse your body with warm water from the neck down.  8. DO NOT shower/wash with your normal soap after using and rinsing off the CHG Soap.  9. Pat yourself dry with a CLEAN TOWEL.   10. Wear CLEAN PAJAMAS  11. Place CLEAN SHEETS on your bed the night of your first shower and DO NOT SLEEP WITH PETS.  Day of Surgery: Do not apply any deodorants/lotions. Please wear clean clothes to the hospital/surgery center.

## 2015-11-05 NOTE — Progress Notes (Signed)
PCP - Dr. Reinaldo Meeker Cardiologist - denies   - Pt. States that he saw Dr. Debara Pickett in 2016 due to some chest pain but it was because of pneumonia.    EKG - 11/05/15 CXR - denies Echo - 2011 Stress test/Cardiac Cath - denies  Patient denies chest pain and shortness of breath at PAT appointment.

## 2015-11-06 MED ORDER — TRANEXAMIC ACID 1000 MG/10ML IV SOLN
1000.0000 mg | INTRAVENOUS | Status: AC
  Administered 2015-11-10: 1000 mg via INTRAVENOUS
  Filled 2015-11-06: qty 10

## 2015-11-10 ENCOUNTER — Inpatient Hospital Stay (HOSPITAL_COMMUNITY): Payer: No Typology Code available for payment source

## 2015-11-10 ENCOUNTER — Inpatient Hospital Stay (HOSPITAL_COMMUNITY)
Admission: RE | Admit: 2015-11-10 | Discharge: 2015-11-13 | DRG: 470 | Disposition: A | Discharge status: Home / Self Care | Payer: No Typology Code available for payment source | Source: Ambulatory Visit | Attending: Orthopaedic Surgery | Admitting: Orthopaedic Surgery

## 2015-11-10 ENCOUNTER — Inpatient Hospital Stay (HOSPITAL_COMMUNITY): Payer: No Typology Code available for payment source | Admitting: Anesthesiology

## 2015-11-10 ENCOUNTER — Encounter (HOSPITAL_COMMUNITY): Payer: Self-pay | Admitting: Certified Registered"

## 2015-11-10 ENCOUNTER — Encounter (HOSPITAL_COMMUNITY)
Admission: RE | Disposition: A | Discharge status: Home / Self Care | Payer: Self-pay | Source: Ambulatory Visit | Attending: Orthopaedic Surgery

## 2015-11-10 DIAGNOSIS — F419 Anxiety disorder, unspecified: Secondary | ICD-10-CM | POA: Diagnosis present

## 2015-11-10 DIAGNOSIS — M549 Dorsalgia, unspecified: Secondary | ICD-10-CM | POA: Diagnosis present

## 2015-11-10 DIAGNOSIS — G8929 Other chronic pain: Secondary | ICD-10-CM | POA: Diagnosis present

## 2015-11-10 DIAGNOSIS — M1612 Unilateral primary osteoarthritis, left hip: Principal | ICD-10-CM | POA: Diagnosis present

## 2015-11-10 DIAGNOSIS — M25552 Pain in left hip: Secondary | ICD-10-CM | POA: Diagnosis present

## 2015-11-10 DIAGNOSIS — Z8546 Personal history of malignant neoplasm of prostate: Secondary | ICD-10-CM | POA: Diagnosis not present

## 2015-11-10 DIAGNOSIS — H9190 Unspecified hearing loss, unspecified ear: Secondary | ICD-10-CM | POA: Diagnosis present

## 2015-11-10 DIAGNOSIS — Z419 Encounter for procedure for purposes other than remedying health state, unspecified: Secondary | ICD-10-CM

## 2015-11-10 DIAGNOSIS — Z96642 Presence of left artificial hip joint: Secondary | ICD-10-CM

## 2015-11-10 HISTORY — PX: TOTAL HIP ARTHROPLASTY: SHX124

## 2015-11-10 SURGERY — ARTHROPLASTY, HIP, TOTAL, ANTERIOR APPROACH
Anesthesia: Spinal | Laterality: Left

## 2015-11-10 MED ORDER — FENTANYL CITRATE (PF) 100 MCG/2ML IJ SOLN
INTRAMUSCULAR | Status: DC | PRN
  Administered 2015-11-10: 50 ug via INTRAVENOUS

## 2015-11-10 MED ORDER — LIDOCAINE 2% (20 MG/ML) 5 ML SYRINGE
INTRAMUSCULAR | Status: AC
  Filled 2015-11-10: qty 5

## 2015-11-10 MED ORDER — ACETAMINOPHEN 325 MG PO TABS
650.0000 mg | ORAL_TABLET | Freq: Four times a day (QID) | ORAL | Status: DC | PRN
  Administered 2015-11-11 – 2015-11-13 (×4): 650 mg via ORAL
  Filled 2015-11-10 (×4): qty 2

## 2015-11-10 MED ORDER — FENTANYL CITRATE (PF) 100 MCG/2ML IJ SOLN
INTRAMUSCULAR | Status: AC
  Filled 2015-11-10: qty 2

## 2015-11-10 MED ORDER — METHOCARBAMOL 500 MG PO TABS
500.0000 mg | ORAL_TABLET | Freq: Four times a day (QID) | ORAL | Status: DC | PRN
  Administered 2015-11-10 – 2015-11-13 (×7): 500 mg via ORAL
  Filled 2015-11-10 (×6): qty 1

## 2015-11-10 MED ORDER — CEFAZOLIN SODIUM-DEXTROSE 2-4 GM/100ML-% IV SOLN
INTRAVENOUS | Status: AC
  Filled 2015-11-10: qty 100

## 2015-11-10 MED ORDER — METOCLOPRAMIDE HCL 5 MG/ML IJ SOLN: 5.0000 mg | Freq: Three times a day (TID) | INTRAMUSCULAR | Status: DC | PRN

## 2015-11-10 MED ORDER — SODIUM CHLORIDE 0.9 % IR SOLN
Status: DC | PRN
  Administered 2015-11-10: 3000 mL

## 2015-11-10 MED ORDER — PROPOFOL 500 MG/50ML IV EMUL
INTRAVENOUS | Status: DC | PRN
  Administered 2015-11-10: 75 ug/kg/min via INTRAVENOUS

## 2015-11-10 MED ORDER — LACTATED RINGERS IV SOLN
INTRAVENOUS | Status: DC
  Administered 2015-11-10 (×2): via INTRAVENOUS

## 2015-11-10 MED ORDER — SODIUM CHLORIDE 0.9 % IV SOLN
INTRAVENOUS | Status: DC
  Administered 2015-11-10: 21:00:00 via INTRAVENOUS

## 2015-11-10 MED ORDER — ACETAMINOPHEN 650 MG RE SUPP: 650.0000 mg | Freq: Four times a day (QID) | RECTAL | Status: DC | PRN

## 2015-11-10 MED ORDER — ONDANSETRON HCL 4 MG/2ML IJ SOLN
INTRAMUSCULAR | Status: DC | PRN
  Administered 2015-11-10: 4 mg via INTRAVENOUS

## 2015-11-10 MED ORDER — METHOCARBAMOL 500 MG PO TABS
ORAL_TABLET | ORAL | Status: AC
  Administered 2015-11-10: 500 mg via ORAL
  Filled 2015-11-10: qty 1

## 2015-11-10 MED ORDER — ONDANSETRON HCL 4 MG PO TABS: 4.0000 mg | ORAL_TABLET | Freq: Four times a day (QID) | ORAL | Status: DC | PRN

## 2015-11-10 MED ORDER — 0.9 % SODIUM CHLORIDE (POUR BTL) OPTIME
TOPICAL | Status: DC | PRN
  Administered 2015-11-10: 1000 mL

## 2015-11-10 MED ORDER — ASPIRIN 81 MG PO CHEW
81.0000 mg | CHEWABLE_TABLET | Freq: Two times a day (BID) | ORAL | Status: DC
  Administered 2015-11-10 – 2015-11-13 (×6): 81 mg via ORAL
  Filled 2015-11-10 (×6): qty 1

## 2015-11-10 MED ORDER — MIDAZOLAM HCL 2 MG/2ML IJ SOLN
INTRAMUSCULAR | Status: AC
  Filled 2015-11-10: qty 2

## 2015-11-10 MED ORDER — ALUM & MAG HYDROXIDE-SIMETH 200-200-20 MG/5ML PO SUSP: 30.0000 mL | ORAL | Status: DC | PRN

## 2015-11-10 MED ORDER — DIPHENHYDRAMINE HCL 12.5 MG/5ML PO ELIX: 12.5000 mg | ORAL_SOLUTION | ORAL | Status: DC | PRN

## 2015-11-10 MED ORDER — PHENOL 1.4 % MT LIQD
1.0000 | OROMUCOSAL | Status: DC | PRN
Start: 2015-11-10 — End: 2015-11-13

## 2015-11-10 MED ORDER — ALBUMIN HUMAN 5 % IV SOLN
INTRAVENOUS | Status: DC | PRN
  Administered 2015-11-10: 17:00:00 via INTRAVENOUS

## 2015-11-10 MED ORDER — METOCLOPRAMIDE HCL 5 MG PO TABS: 5.0000 mg | ORAL_TABLET | Freq: Three times a day (TID) | ORAL | Status: DC | PRN

## 2015-11-10 MED ORDER — HYDROMORPHONE HCL 1 MG/ML IJ SOLN
1.0000 mg | INTRAMUSCULAR | Status: DC | PRN
  Administered 2015-11-10 – 2015-11-12 (×5): 1 mg via INTRAVENOUS
  Filled 2015-11-10 (×5): qty 1

## 2015-11-10 MED ORDER — PROMETHAZINE HCL 25 MG/ML IJ SOLN
6.2500 mg | INTRAMUSCULAR | Status: DC | PRN
Start: 2015-11-10 — End: 2015-11-10

## 2015-11-10 MED ORDER — METHOCARBAMOL 1000 MG/10ML IJ SOLN
500.0000 mg | Freq: Four times a day (QID) | INTRAVENOUS | Status: DC | PRN
  Filled 2015-11-10: qty 5

## 2015-11-10 MED ORDER — CLONAZEPAM 1 MG PO TABS: 1.0000 mg | ORAL_TABLET | Freq: Two times a day (BID) | ORAL | Status: DC | PRN

## 2015-11-10 MED ORDER — HYDROMORPHONE HCL 1 MG/ML IJ SOLN
0.2500 mg | INTRAMUSCULAR | Status: DC | PRN
  Administered 2015-11-10: 0.5 mg via INTRAVENOUS

## 2015-11-10 MED ORDER — PHENYLEPHRINE 40 MCG/ML (10ML) SYRINGE FOR IV PUSH (FOR BLOOD PRESSURE SUPPORT)
PREFILLED_SYRINGE | INTRAVENOUS | Status: AC
  Filled 2015-11-10: qty 10

## 2015-11-10 MED ORDER — CEFAZOLIN IN D5W 1 GM/50ML IV SOLN
1.0000 g | Freq: Four times a day (QID) | INTRAVENOUS | Status: AC
  Administered 2015-11-10 – 2015-11-11 (×2): 1 g via INTRAVENOUS
  Filled 2015-11-10 (×2): qty 50

## 2015-11-10 MED ORDER — ZOLPIDEM TARTRATE 5 MG PO TABS
5.0000 mg | ORAL_TABLET | Freq: Every evening | ORAL | Status: DC | PRN
  Administered 2015-11-10: 5 mg via ORAL
  Filled 2015-11-10: qty 1

## 2015-11-10 MED ORDER — HYDROCODONE-ACETAMINOPHEN 7.5-325 MG PO TABS
ORAL_TABLET | ORAL | Status: AC
  Administered 2015-11-10: 1 via ORAL
  Filled 2015-11-10: qty 1

## 2015-11-10 MED ORDER — HYDROMORPHONE HCL 1 MG/ML IJ SOLN
INTRAMUSCULAR | Status: AC
  Administered 2015-11-10: 0.5 mg via INTRAVENOUS
  Filled 2015-11-10: qty 1

## 2015-11-10 MED ORDER — HYDROCODONE-ACETAMINOPHEN 7.5-325 MG PO TABS
1.0000 | ORAL_TABLET | Freq: Once | ORAL | Status: AC | PRN
  Administered 2015-11-10: 1 via ORAL

## 2015-11-10 MED ORDER — BUPIVACAINE HCL (PF) 0.75 % IJ SOLN
INTRAMUSCULAR | Status: DC | PRN
  Administered 2015-11-10: 2 mL via INTRATHECAL

## 2015-11-10 MED ORDER — MENTHOL 3 MG MT LOZG: 1.0000 | LOZENGE | OROMUCOSAL | Status: DC | PRN

## 2015-11-10 MED ORDER — DOCUSATE SODIUM 100 MG PO CAPS
100.0000 mg | ORAL_CAPSULE | Freq: Two times a day (BID) | ORAL | Status: DC
  Administered 2015-11-10 – 2015-11-13 (×6): 100 mg via ORAL
  Filled 2015-11-10 (×6): qty 1

## 2015-11-10 MED ORDER — ONDANSETRON HCL 4 MG/2ML IJ SOLN
INTRAMUSCULAR | Status: AC
  Filled 2015-11-10: qty 2

## 2015-11-10 MED ORDER — ONDANSETRON HCL 4 MG/2ML IJ SOLN: 4.0000 mg | Freq: Four times a day (QID) | INTRAMUSCULAR | Status: DC | PRN

## 2015-11-10 MED ORDER — OXYCODONE HCL 5 MG PO TABS
5.0000 mg | ORAL_TABLET | ORAL | Status: DC | PRN
  Administered 2015-11-10 – 2015-11-11 (×3): 10 mg via ORAL
  Administered 2015-11-11: 5 mg via ORAL
  Administered 2015-11-11 – 2015-11-13 (×8): 10 mg via ORAL
  Filled 2015-11-10 (×12): qty 2

## 2015-11-10 MED ORDER — MIDAZOLAM HCL 5 MG/5ML IJ SOLN
INTRAMUSCULAR | Status: DC | PRN
  Administered 2015-11-10: 2 mg via INTRAVENOUS

## 2015-11-10 MED ORDER — CEFAZOLIN SODIUM-DEXTROSE 2-4 GM/100ML-% IV SOLN
2.0000 g | INTRAVENOUS | Status: AC
  Administered 2015-11-10: 2 g via INTRAVENOUS

## 2015-11-10 MED ORDER — PREGABALIN 75 MG PO CAPS
75.0000 mg | ORAL_CAPSULE | Freq: Two times a day (BID) | ORAL | Status: DC
  Administered 2015-11-10 – 2015-11-13 (×6): 75 mg via ORAL
  Filled 2015-11-10 (×6): qty 1

## 2015-11-10 MED ORDER — CHLORHEXIDINE GLUCONATE 4 % EX LIQD: 60.0000 mL | Freq: Once | CUTANEOUS | Status: DC

## 2015-11-10 SURGICAL SUPPLY — 51 items
BENZOIN TINCTURE PRP APPL 2/3 (GAUZE/BANDAGES/DRESSINGS) ×2 IMPLANT
BLADE SAW SGTL 18X1.27X75 (BLADE) ×2 IMPLANT
BLADE SURG ROTATE 9660 (MISCELLANEOUS) IMPLANT
CAPT HIP TOTAL 2 ×2 IMPLANT
CELLS DAT CNTRL 66122 CELL SVR (MISCELLANEOUS) ×1 IMPLANT
COVER SURGICAL LIGHT HANDLE (MISCELLANEOUS) ×2 IMPLANT
DRAPE C-ARM 42X72 X-RAY (DRAPES) ×2 IMPLANT
DRAPE STERI IOBAN 125X83 (DRAPES) ×2 IMPLANT
DRAPE U-SHAPE 47X51 STRL (DRAPES) ×6 IMPLANT
DRESSING AQUACEL AG SP 3.5X10 (GAUZE/BANDAGES/DRESSINGS) ×1 IMPLANT
DRSG AQUACEL AG ADV 3.5X10 (GAUZE/BANDAGES/DRESSINGS) ×2 IMPLANT
DRSG AQUACEL AG SP 3.5X10 (GAUZE/BANDAGES/DRESSINGS) ×2
DURAPREP 26ML APPLICATOR (WOUND CARE) ×2 IMPLANT
ELECT BLADE 4.0 EZ CLEAN MEGAD (MISCELLANEOUS) ×2
ELECT BLADE 6.5 EXT (BLADE) ×2 IMPLANT
ELECT REM PT RETURN 9FT ADLT (ELECTROSURGICAL) ×2
ELECTRODE BLDE 4.0 EZ CLN MEGD (MISCELLANEOUS) ×1 IMPLANT
ELECTRODE REM PT RTRN 9FT ADLT (ELECTROSURGICAL) ×1 IMPLANT
FACESHIELD WRAPAROUND (MASK) ×4 IMPLANT
GAUZE XEROFORM 1X8 LF (GAUZE/BANDAGES/DRESSINGS) ×2 IMPLANT
GLOVE BIOGEL PI IND STRL 8 (GLOVE) ×2 IMPLANT
GLOVE BIOGEL PI INDICATOR 8 (GLOVE) ×2
GLOVE ECLIPSE 8.0 STRL XLNG CF (GLOVE) ×2 IMPLANT
GLOVE ORTHO TXT STRL SZ7.5 (GLOVE) ×4 IMPLANT
GOWN STRL REUS W/ TWL LRG LVL3 (GOWN DISPOSABLE) ×2 IMPLANT
GOWN STRL REUS W/ TWL XL LVL3 (GOWN DISPOSABLE) ×2 IMPLANT
GOWN STRL REUS W/TWL LRG LVL3 (GOWN DISPOSABLE) ×2
GOWN STRL REUS W/TWL XL LVL3 (GOWN DISPOSABLE) ×2
HANDPIECE INTERPULSE COAX TIP (DISPOSABLE) ×1
KIT BASIN OR (CUSTOM PROCEDURE TRAY) ×2 IMPLANT
KIT ROOM TURNOVER OR (KITS) ×2 IMPLANT
MANIFOLD NEPTUNE II (INSTRUMENTS) ×2 IMPLANT
NS IRRIG 1000ML POUR BTL (IV SOLUTION) ×2 IMPLANT
PACK TOTAL JOINT (CUSTOM PROCEDURE TRAY) ×2 IMPLANT
PAD ARMBOARD 7.5X6 YLW CONV (MISCELLANEOUS) ×2 IMPLANT
RTRCTR WOUND ALEXIS 18CM MED (MISCELLANEOUS) ×2
SET HNDPC FAN SPRY TIP SCT (DISPOSABLE) ×1 IMPLANT
STAPLER VISISTAT 35W (STAPLE) ×2 IMPLANT
STRIP CLOSURE SKIN 1/2X4 (GAUZE/BANDAGES/DRESSINGS) ×2 IMPLANT
SUT ETHIBOND NAB CT1 #1 30IN (SUTURE) ×2 IMPLANT
SUT MNCRL AB 4-0 PS2 18 (SUTURE) IMPLANT
SUT VIC AB 0 CT1 27 (SUTURE) ×1
SUT VIC AB 0 CT1 27XBRD ANBCTR (SUTURE) ×1 IMPLANT
SUT VIC AB 1 CT1 27 (SUTURE) ×1
SUT VIC AB 1 CT1 27XBRD ANBCTR (SUTURE) ×1 IMPLANT
SUT VIC AB 2-0 CT1 27 (SUTURE) ×1
SUT VIC AB 2-0 CT1 TAPERPNT 27 (SUTURE) ×1 IMPLANT
TOWEL OR 17X24 6PK STRL BLUE (TOWEL DISPOSABLE) ×2 IMPLANT
TOWEL OR 17X26 10 PK STRL BLUE (TOWEL DISPOSABLE) ×2 IMPLANT
TRAY FOLEY CATH 16FRSI W/METER (SET/KITS/TRAYS/PACK) IMPLANT
WATER STERILE IRR 1000ML POUR (IV SOLUTION) IMPLANT

## 2015-11-10 NOTE — Transfer of Care (Signed)
Immediate Anesthesia Transfer of Care Note  Patient: Jacob Collier  Procedure(s) Performed: Procedure(s): LEFT TOTAL HIP ARTHROPLASTY ANTERIOR APPROACH (Left)  Patient Location: PACU  Anesthesia Type:MAC and Spinal  Level of Consciousness: awake  Airway & Oxygen Therapy: Patient Spontanous Breathing and Patient connected to nasal cannula oxygen  Post-op Assessment: Report given to RN  Post vital signs: Reviewed and stable  Last Vitals:  Vitals:   11/10/15 1220 11/10/15 1221  BP: (!) 141/82   Pulse: (!) 59   Resp: 18   Temp:  36.9 C    Last Pain:  Vitals:   11/10/15 1220  TempSrc: Oral         Complications: No apparent anesthesia complications
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
## 2015-11-10 NOTE — Anesthesia Preprocedure Evaluation (Addendum)
Anesthesia Evaluation  Patient identified by MRN, date of birth, ID band Patient awake    Reviewed: Allergy & Precautions, H&P , NPO status , Patient's Chart, lab work & pertinent test results  History of Anesthesia Complications Negative for: history of anesthetic complications  Airway Mallampati: II  TM Distance: >3 FB Neck ROM: full    Dental no notable dental hx. (+) Teeth Intact, Poor Dentition, Dental Advisory Given   Pulmonary shortness of breath,    Pulmonary exam normal breath sounds clear to auscultation       Cardiovascular + DOE  Normal cardiovascular exam Rhythm:regular Rate:Normal     Neuro/Psych negative neurological ROS     GI/Hepatic negative GI ROS, Neg liver ROS,   Endo/Other  negative endocrine ROS  Renal/GU negative Renal ROS     Musculoskeletal  (+) Arthritis ,   Abdominal   Peds  Hematology negative hematology ROS (+)   Anesthesia Other Findings   Reproductive/Obstetrics negative OB ROS                            Anesthesia Physical Anesthesia Plan  ASA: II  Anesthesia Plan: Spinal   Post-op Pain Management:    Induction: Intravenous  Airway Management Planned: Simple Face Mask  Additional Equipment:   Intra-op Plan:   Post-operative Plan:   Informed Consent: I have reviewed the patients History and Physical, chart, labs and discussed the procedure including the risks, benefits and alternatives for the proposed anesthesia with the patient or authorized representative who has indicated his/her understanding and acceptance.   Dental Advisory Given  Plan Discussed with: Anesthesiologist, CRNA and Surgeon  Anesthesia Plan Comments:         Anesthesia Quick Evaluation

## 2015-11-10 NOTE — Anesthesia Postprocedure Evaluation (Signed)
Anesthesia Post Note  Patient: Jacob Collier  Procedure(s) Performed: Procedure(s) (LRB): LEFT TOTAL HIP ARTHROPLASTY ANTERIOR APPROACH (Left)  Patient location during evaluation: PACU Anesthesia Type: General Level of consciousness: awake and alert Pain management: pain level controlled Vital Signs Assessment: post-procedure vital signs reviewed and stable Respiratory status: spontaneous breathing, nonlabored ventilation, respiratory function stable and patient connected to nasal cannula oxygen Cardiovascular status: blood pressure returned to baseline and stable Postop Assessment: no signs of nausea or vomiting Anesthetic complications: no    Last Vitals:  Vitals:   11/10/15 1800 11/10/15 1815  BP: 91/65 (!) 100/53  Pulse: (!) 43 (!) 43  Resp: 14 (!) 9  Temp:      Last Pain:  Vitals:   11/10/15 1740  TempSrc:   PainSc: 0-No pain                 Ailynn Gow S

## 2015-11-10 NOTE — Anesthesia Procedure Notes (Signed)
Procedure Name: MAC Date/Time: 11/10/2015 3:58 PM Performed by: Barrington Ellison Pre-anesthesia Checklist: Patient identified, Emergency Drugs available, Suction available, Patient being monitored and Timeout performed Patient Re-evaluated:Patient Re-evaluated prior to inductionOxygen Delivery Method: Simple face mask

## 2015-11-10 NOTE — Anesthesia Procedure Notes (Signed)
Spinal  Patient location during procedure: OR Staffing Anesthesiologist: JUDD, BENJAMIN Performed: anesthesiologist  Preanesthetic Checklist Completed: patient identified, site marked, surgical consent, pre-op evaluation, timeout performed, IV checked, risks and benefits discussed and monitors and equipment checked Spinal Block Patient position: sitting Prep: DuraPrep Patient monitoring: heart rate Approach: midline Location: L3-4 Injection technique: single-shot Needle Needle type: Quincke  Needle gauge: 22 G Assessment Sensory level: T6 Additional Notes No blood, CSF aspirated well, right sided parasthesia no reaapproached from left and no parasthesia with reattempt.. Difficult spinal due to arthritis, attempts x3 with pencan and then success quickly with [facility]

## 2015-11-10 NOTE — Brief Op Note (Signed)
11/10/2015  5:44 PM  PATIENT:  Jacob Collier  60 y.o. male  PRE-OPERATIVE DIAGNOSIS:  severe osteoarthritis left hip  POST-OPERATIVE DIAGNOSIS:  severe osteoarthritis left hip  PROCEDURE:  Procedure(s): LEFT TOTAL HIP ARTHROPLASTY ANTERIOR APPROACH (Left)  SURGEON:  Surgeon(s) and Role:    * Mcarthur Rossetti, MD - Primary  PHYSICIAN ASSISTANT: Benita Stabile, PA-C  ANESTHESIA:   spinal  EBL:  Total I/O In: 1250 [I.V.:1000; IV Piggyback:250] Out: 550 [Blood:550]  COUNTS:  YES  DICTATION: .Other Dictation: Dictation Number [id number]  PLAN OF CARE: Admit to inpatient   PATIENT DISPOSITION:  PACU - hemodynamically stable.   Delay start of Pharmacological VTE agent (>24hrs) due to surgical blood loss or risk of bleeding: no

## 2015-11-11 ENCOUNTER — Encounter (HOSPITAL_COMMUNITY): Payer: Self-pay | Admitting: Orthopaedic Surgery

## 2015-11-11 LAB — BASIC METABOLIC PANEL WITH GFR
CO2: 24 mmol/L (ref 22–32)
Calcium: 8 mg/dL — ABNORMAL LOW (ref 8.9–10.3)
Creatinine, Ser: 1.28 mg/dL — ABNORMAL HIGH (ref 0.61–1.24)
Glucose, Bld: 107 mg/dL — ABNORMAL HIGH (ref 65–99)

## 2015-11-11 LAB — CBC
HCT: 37.1 % — ABNORMAL LOW (ref 39.0–52.0)
Hemoglobin: 11.6 g/dL — ABNORMAL LOW (ref 13.0–17.0)
MCH: 28.9 pg (ref 26.0–34.0)
MCHC: 31.3 g/dL (ref 30.0–36.0)
MCV: 92.3 fL (ref 78.0–100.0)
Platelets: 150 10*3/uL (ref 150–400)
RBC: 4.02 MIL/uL — ABNORMAL LOW (ref 4.22–5.81)
RDW: 14.5 % (ref 11.5–15.5)
WBC: 7.4 10*3/uL (ref 4.0–10.5)

## 2015-11-11 LAB — BASIC METABOLIC PANEL
Anion gap: 12 (ref 5–15)
BUN: 14 mg/dL (ref 6–20)
Chloride: 100 mmol/L — ABNORMAL LOW (ref 101–111)
GFR calc Af Amer: >60 mL/min (ref >60)
GFR calc non Af Amer: 59 mL/min — ABNORMAL LOW (ref >60)
Potassium: 4 mmol/L (ref 3.5–5.1)
Sodium: 136 mmol/L (ref 135–145)

## 2015-11-11 NOTE — Evaluation (Signed)
Occupational Therapy Evaluation and Discharge Patient Details Name: Jacob Collier MRN: [id number] DOB: 1956-01-07 Today's Date: 11/11/2015    History of Present Illness 60 y.o. male now s/p Lt anterior THA Prostate cancer, back pain.    Clinical Impression   Pt educated in compensatory strategies and use of AE for LB ADL. Will rely on wife's assist. Pt instructed  in multiple uses of 3 in 1, toilet and shower transfers. Educated in safe footwear and transporting items safely with RW. Pt verbalizing understanding of all information    Follow Up Recommendations  No OT follow up    Equipment Recommendations  3 in 1 bedside comode    Recommendations for Other Services       Precautions / Restrictions Precautions Precautions: Fall Precaution Comments: no precautions, direct anterior Restrictions Weight Bearing Restrictions: Yes LLE Weight Bearing: Weight bearing as tolerated      Mobility Bed Mobility Overal bed mobility: Needs Assistance Bed Mobility: Supine to Sit;Sit to Supine     Supine to sit: Supervision Sit to supine: Supervision   General bed mobility comments: no physical assist  Transfers Overall transfer level: Needs assistance Equipment used: Rolling walker (2 wheeled) Transfers: Sit to/from Stand Sit to Stand: Min guard         General transfer comment: for safety    Balance Overall balance assessment: Needs assistance Sitting-balance support: No upper extremity supported Sitting balance-Leahy Scale: Good     Standing balance support: Bilateral upper extremity supported Standing balance-Leahy Scale: Poor Standing balance comment: using rw                            ADL Overall ADL's : Needs assistance/impaired Eating/Feeding: Independent;Sitting   Grooming: Wash/dry hands;Standing;Min guard   Upper Body Bathing: Set up;Sitting   Lower Body Bathing: Minimal assistance;Sit to/from stand Lower Body Bathing Details (indicate cue  type and reason): educated pt in availability of long bath sponge Upper Body Dressing : Set up;Sitting   Lower Body Dressing: Minimal assistance;Sit to/from stand Lower Body Dressing Details (indicate cue type and reason): educated pt in use of AE, safe footwear, compensatory strategies Toilet Transfer: Min Marine scientist Details (indicate cue type and reason): stood to urinate Toileting- Water quality scientist and Hygiene: Education officer, museum Details (indicate cue type and reason): educated in technique, use of 3 in 1 as shower seat Functional mobility during ADLs: Min guard;Rolling walker General ADL Comments: Educated pt in multiple uses of 3 in 1.     Vision     Perception     Praxis      Pertinent Vitals/Pain Pain Assessment: Faces Pain Score: 5  Faces Pain Scale: Hurts whole lot Pain Location: L hip Pain Descriptors / Indicators: Aching;Grimacing;Guarding Pain Intervention(s): Monitored during session;Repositioned;Ice applied;Patient requesting pain meds-RN notified;RN gave pain meds during session     Hand Dominance Right   Extremity/Trunk Assessment Upper Extremity Assessment Upper Extremity Assessment: Overall WFL for tasks assessed   Lower Extremity Assessment Lower Extremity Assessment: Defer to PT evaluation LLE Deficits / Details: active motion limited by pain. Able to move without assistance for bed mobility.        Communication Communication Communication: HOH   Cognition Arousal/Alertness: Awake/alert Behavior During Therapy: WFL for tasks assessed/performed Overall Cognitive Status: Within Functional Limits for tasks assessed                     General  Comments       Exercises Exercises: Total Joint     Shoulder Instructions      Home Living Family/patient expects to be discharged to:: Private residence Living Arrangements: Spouse/significant other;Children Available Help at Discharge:  Family;Available 24 hours/day Type of Home: House Home Access: Stairs to enter [employer] of Steps: 6 Entrance Stairs-Rails: Right;Left Home Layout: One level     Bathroom Shower/Tub: Occupational psychologist: Standard     Home Equipment: None          Prior Functioning/Environment Level of Independence: Needs assistance    ADL's / Homemaking Assistance Needed: wife has been assisting with LB ADL x 1 year        OT Diagnosis: Generalized weakness;Acute pain   OT Problem List:     OT Treatment/Interventions:      OT Goals(Current goals can be found in the care plan section) Acute Rehab OT Goals Patient Stated Goal: Be able to get back to weightlifting again  OT Frequency:     Barriers to D/C:            Co-evaluation              End of Session Equipment Utilized During Treatment: Gait belt;Rolling walker Nurse Communication: Patient requests pain meds  Activity Tolerance: Patient tolerated treatment well Patient left: in bed;with call bell/phone within reach;with nursing/sitter in room   Time: 1426-1451 OT Time Calculation (min): 25 min Charges:  OT General Charges $OT Visit: 1 Procedure OT Evaluation $OT Eval Low Complexity: 1 Procedure OT Treatments $Self Care/Home Management : 8-22 mins G-Codes:    Malka So 11/11/2015, 2:58 PM  [phone]

## 2015-11-11 NOTE — Evaluation (Signed)
Physical Therapy Evaluation Patient Details Name: Jacob Collier MRN: [id number] DOB: 11-13-1955 Today's Date: 11/11/2015   History of Present Illness  60 y.o. male now s/p Lt anterior THA Prostate cancer, back pain.   Clinical Impression  Pt is s/p Lt anterior THA resulting in the deficits listed below (see PT Problem List). Able to ambulate 25 ft with rw and min guard.  Pt will benefit from skilled PT to increase their independence and safety with mobility to allow discharge to home with family support.      Follow Up Recommendations Home health PT;Supervision for mobility/OOB    Equipment Recommendations  Rolling walker with 5" wheels    Recommendations for Other Services       Precautions / Restrictions Precautions Precautions: Fall Precaution Comments: awaiting clarification of hip precautions.  Restrictions Weight Bearing Restrictions: Yes LLE Weight Bearing: Weight bearing as tolerated      Mobility  Bed Mobility Overal bed mobility: Needs Assistance Bed Mobility: Supine to Sit     Supine to sit: Min guard     General bed mobility comments: HOB elevated, using rail to assist.   Transfers Overall transfer level: Needs assistance Equipment used: Rolling walker (2 wheeled) Transfers: Sit to/from Stand Sit to Stand: Min guard         General transfer comment: cues for hand position but pt standing without performing, reviewed safety aspect  Ambulation/Gait Ambulation/Gait assistance: Min guard Ambulation Distance (Feet): 25 Feet Assistive device: Rolling walker (2 wheeled) Gait Pattern/deviations: Step-to pattern Gait velocity: decreased   General Gait Details: encouraging weightbearing as tolerated, cues for gait sequence.   Stairs            Wheelchair Mobility    Modified Rankin (Stroke Patients Only)       Balance Overall balance assessment: Needs assistance Sitting-balance support: No upper extremity supported Sitting balance-Leahy  Scale: Good     Standing balance support: Bilateral upper extremity supported Standing balance-Leahy Scale: Poor Standing balance comment: using rw for support                             Pertinent Vitals/Pain Pain Assessment: 0-10 Pain Score: 8  Pain Location: Lt hip Pain Descriptors / Indicators: Aching Pain Intervention(s): Limited activity within patient's tolerance;Monitored during session    Home Living Family/patient expects to be discharged to:: Private residence Living Arrangements: Spouse/significant other;Children Available Help at Discharge: Family;Available 24 hours/day Type of Home: House Home Access: Stairs to enter Entrance Stairs-Rails: Psychiatric nurse of Steps: 6 Home Layout: One level Home Equipment: None      Prior Function Level of Independence: Independent               Hand Dominance        Extremity/Trunk Assessment   Upper Extremity Assessment: Overall WFL for tasks assessed           Lower Extremity Assessment: LLE deficits/detail   LLE Deficits / Details: active motion limited by pain. Able to move without assistance for bed mobility.      Communication   Communication: HOH  Cognition Arousal/Alertness: Awake/alert Behavior During Therapy: WFL for tasks assessed/performed Overall Cognitive Status: Within Functional Limits for tasks assessed                      General Comments      Exercises        Assessment/Plan    PT Assessment  Patient needs continued PT services  PT Diagnosis Difficulty walking   PT Problem List Decreased strength;Decreased range of motion;Decreased activity tolerance;Decreased balance;Decreased mobility  PT Treatment Interventions DME instruction;Gait training;Stair training;Functional mobility training;Therapeutic activities;Therapeutic exercise;Patient/family education   PT Goals (Current goals can be found in the Care Plan section) Acute Rehab PT  Goals Patient Stated Goal: Be able to get back to weightlifting again PT Goal Formulation: With patient Time For Goal Achievement: 11/25/15 Potential to Achieve Goals: Good    Frequency 7X/week   Barriers to discharge        Co-evaluation               End of Session Equipment Utilized During Treatment: Gait belt Activity Tolerance: Patient tolerated treatment well Patient left: in chair;with call bell/phone within reach Nurse Communication: Mobility status;Weight bearing status         Time: 1123-1200 PT Time Calculation (min) (ACUTE ONLY): 37 min   Charges:   PT Evaluation $PT Eval Moderate Complexity: 1 Procedure PT Treatments $Gait Training: 8-22 mins   PT G Codes:        Cassell Clement, PT, CSCS Pager [phone] Office [phone]  11/11/2015, 12:37 PM

## 2015-11-11 NOTE — Progress Notes (Signed)
Subjective: 1 Day Post-Op Procedure(s) (LRB): LEFT TOTAL HIP ARTHROPLASTY ANTERIOR APPROACH (Left) Patient reports pain as moderate.    Objective: Vital signs in last 24 hours: Temp:  [97.3 F (36.3 C)-99.1 F (37.3 C)] 99.1 F (37.3 C) (09/06 0450) Pulse Rate:  [40-77] 77 (09/06 0450) Resp:  [9-20] 20 (09/06 0450) BP: (88-141)/(53-86) 104/67 (09/06 0450) SpO2:  [95 %-100 %] 97 % (09/06 0450) Weight:  [97.3 kg (214 lb 8 oz)] 97.3 kg (214 lb 8 oz) (09/05 1220)  Intake/Output from previous day: 09/05 0701 - 09/06 0700 In: 2775 [P.O.:600; I.V.:1825; IV Piggyback:350] Out: [id number] [Urine:725; Blood:550] Intake/Output this shift: No intake/output data recorded.   Recent Labs  11/11/15 0537  HGB 11.6*    Recent Labs  11/11/15 0537  WBC 7.4  RBC 4.02*  HCT 37.1*  PLT 150    Recent Labs  11/11/15 0537  NA 136  K 4.0  CL 100*  CO2 24  BUN 14  CREATININE 1.28*  GLUCOSE 107*  CALCIUM 8.0*   No results for input(s): LABPT, INR in the last 72 hours.  Sensation intact distally Intact pulses distally Dorsiflexion/Plantar flexion intact Incision: dressing C/D/I  Assessment/Plan: 1 Day Post-Op Procedure(s) (LRB): LEFT TOTAL HIP ARTHROPLASTY ANTERIOR APPROACH (Left) Up with therapy  Mcarthur Rossetti 11/11/2015, 7:39 AM

## 2015-11-11 NOTE — Op Note (Signed)
NAME:  Jacob Collier, Jacob Collier NO.:  [id number]  MEDICAL RECORD NO.:  [id number]  LOCATION:  [id number]                        FACILITY:  [facility]  PHYSICIAN:  Lind Guest. Ninfa Linden, M.D.DATE OF BIRTH:  1955-09-13  DATE OF PROCEDURE:  11/10/2015 DATE OF DISCHARGE:                              OPERATIVE REPORT   PREOPERATIVE DIAGNOSIS:  Primary osteoarthritis and degenerative joint disease, left hip.  POSTOPERATIVE DIAGNOSIS:  Primary osteoarthritis and degenerative joint disease, left hip.  PROCEDURE:  Left total hip arthroplasty through direct anterior approach.  IMPLANTS:  DePuy Sector Gription acetabular component size 58, size 36+ 0 neutral polyethylene liner, size 13 Corail femoral component with standard offset, size 36- 2 metal hip ball.  SURGEON:  Lind Guest. Ninfa Linden M.D.  ASSISTANT:  Erskine Emery, PA-C.  ANESTHESIA:  Spinal.  BLOOD LOSS:  500 mL.  ANTIBIOTICS:  2 g of IV Ancef.  COMPLICATIONS:  None.  INDICATIONS:  [name] is a 60 year old gentleman, well known to me.  He has well documented debilitating arthritis involving his left hip.  We have seen him for many years now and he has had multiple injections in that hip.  His x-rays show complete loss of the joint space, periarticular osteophytes, and sclerotic changes.  His pain is daily, it detrimentally affected his activities of daily living, his quality of life and his mobility to the point he wished to proceed with a total hip arthroplasty.  He understands the risks of acute blood loss anemia, nerve and vessel injury, fracture, infection, dislocation, and DVT.  He understands our goals are decreased pain, improved mobility, and overall proved quality of life.  PROCEDURE DESCRIPTION:  After informed consent was obtained, appropriate left hip was marked.  He was brought to the operating room where spinal anesthesia was obtained while he was on his stretcher.  He was then placed supine on the  Hana fracture table.  The perineal post was in place and both legs in inline skeletal traction devices, but no traction applied.  His left operative hip was prepped and draped with DuraPrep sterile drapes.  Time-out was called, he was identified as correct patient and correct left hip. We then made an incision inferior and posterior to the anterior superior iliac spine and carried this obliquely down the leg.  We dissected down the tensor fascia lata muscle.  The tensor fascia was then divided longitudinally, so we could proceed with a direct anterior approach to the hip.  We identified and cauterized the circumflex vessels and then identified the hip capsule, opening up the hip capsule in L-type format, placing the Cobra retractors within the hip capsule.  We made our femoral neck cut with an oscillating saw proximal to the lesser trochanter and completed this on osteotome.  We placed a corkscrew guide in the femoral head and removed the femoral head in its entirety and found to be completely devoid of cartilage.  We then placed a bent Hohmann over the medial acetabular rim and we removed remnants of the acetabular labrum.  I then began reaming from a size 42 reamer and jumped in increments all the way up to a size 58 with all reamers under direct  visualization with the last reamer under direct fluoroscopy, so we could obtain our depth of reaming, our inclination, and anteversion.  Once I was pleased with this, I placed the real DePuy Sector Gription acetabular component size 58 and a 36+ 0 neutral polyethylene liner for that size acetabular component. Attention was then turned to the femur with the leg externally rotated to 120 degrees, extended and abducted.  We were able to use a Mueller retractor medially and Hohmann retractor behind the greater trochanter. We released the lateral joint capsule and used a box cutting osteotome to enter the femoral canal and a rongeur lateralize them  and we then began broaching from a size 8 broach using the Corail broaching system up to a size 13 with a size 13 in place, we trialed a standard offset femoral neck and a 36+ 1.5 hip ball.  We brought the leg back over and up with traction and internal rotation reducing the pelvis,  and the knee was stable, but it felt like his leg lengths were too long.  We dislocated the hip and removed the trial components.  We were able to place the real Corail 13 femoral component without difficulty and a 36 - 2 metal hip ball and reduced this in the acetabulum.  We were pleased with leg length, offset, and stability.  We then irrigated the soft tissue with normal saline solution using pulsatile lavage.  We were able to close the joint capsule with interrupted #1 Ethibond suture followed by running #1 Vicryl in the tensor fascia, 0 Vicryl deep tissue, 2-0 Vicryl in the subcutaneous tissue, 4-0 Monocryl subcuticular stitch, and Steri-Strips on the skin and Aquacel dressing was applied after Steri- Strips were placed.  He was taken off the Hana table and taken to the recovery room in stable condition.  All final counts were correct. There were no complications noted.  Of note, Erskine Emery, PA-C assisted in the entire case.  His assistance was crucial for facilitating all aspects of this case.     Lind Guest. Ninfa Linden, M.D.     CYB/MEDQ  D:  11/10/2015  T:  11/11/2015  Job:  [id number]

## 2015-11-11 NOTE — Progress Notes (Signed)
PT Progress Note  Pt making gains with mobility during PT sessions, able to ambulate 50 ft with rw. Anticipate pt will D/C to home with family support. PT to follow and maximize mobility and independence.     11/11/15 1429  PT Visit Information  Last PT Received On 11/11/15  Assistance Needed +1  History of Present Illness 60 y.o. male now s/p Lt anterior THA Prostate cancer, back pain.   Subjective Data  Subjective Felt better to be up and moving.   Patient Stated Goal Be able to get back to weightlifting again  Precautions  Precautions Fall  Precaution Comments awaiting clarification of hip precautions.   Restrictions  Weight Bearing Restrictions Yes  LLE Weight Bearing WBAT  Pain Assessment  Pain Assessment 0-10  Pain Score 5  Pain Location Lt hip  Pain Descriptors / Indicators Aching  Pain Intervention(s) Limited activity within patient's tolerance;Monitored during session  Cognition  Arousal/Alertness Awake/alert  Behavior During Therapy WFL for tasks assessed/performed  Overall Cognitive Status Within Functional Limits for tasks assessed  Bed Mobility  Overal bed mobility Needs Assistance  Bed Mobility Sit to Supine  Sit to supine Min assist  General bed mobility comments min assist with LLE  Transfers  Overall transfer level Needs assistance  Equipment used Rolling walker (2 wheeled)  Transfers Sit to/from Stand  Sit to Stand Min guard  General transfer comment reviewed need to fully turn prior to sitting and to reach back for chair/bed before sitting.   Ambulation/Gait  Ambulation/Gait assistance Min guard  Ambulation Distance (Feet) 50 Feet  Assistive device Rolling walker (2 wheeled)  Gait Pattern/deviations Step-through pattern;Decreased step length - right;Decreased step length - left;Decreased weight shift to left  General Gait Details cues for posture, working on even strides and weightbearing.   Gait velocity decreased  Balance  Overall balance  assessment Needs assistance  Sitting-balance support No upper extremity supported  Sitting balance-Leahy Scale Good  Standing balance support Bilateral upper extremity supported  Standing balance-Leahy Scale Poor  Standing balance comment using rw  Exercises  Exercises Total Joint  Total Joint Exercises  Ankle Circles/Pumps AROM;Both;10 reps  Quad Sets Strengthening;Left;10 reps  Gluteal Sets Strengthening;Both;10 reps  Short Arc Quad Strengthening;Left;10 reps (min assist)  Heel Slides AAROM;Left;10 reps  PT - End of Session  Equipment Utilized During Treatment Gait belt  Activity Tolerance Patient tolerated treatment well  Patient left in bed;with call bell/phone within reach;with SCD's reapplied  Nurse Communication Mobility status;Weight bearing status  PT - Assessment/Plan  PT Plan Current plan remains appropriate  PT Frequency (ACUTE ONLY) 7X/week  Follow Up Recommendations Home health PT;Supervision for mobility/OOB  PT equipment Rolling walker with 5" wheels  PT Goal Progression  Progress towards PT goals Progressing toward goals  Acute Rehab PT Goals  PT Goal Formulation With patient  Time For Goal Achievement 11/25/15  Potential to Achieve Goals Good  PT Time Calculation  PT Start Time (ACUTE ONLY) 1354  PT Stop Time (ACUTE ONLY) 1426  PT Time Calculation (min) (ACUTE ONLY) 32 min  PT General Charges  $$ ACUTE PT VISIT 1 Procedure  PT Treatments  $Gait Training 8-22 mins  $Therapeutic Exercise 8-22 mins  Cassell Clement, PT, CSCS Pager [phone] Office [phone]

## 2015-11-12 MED ORDER — OXYCODONE-ACETAMINOPHEN 5-325 MG PO TABS: 1.0000 | ORAL_TABLET | ORAL | 0 refills | Status: DC | PRN

## 2015-11-12 MED ORDER — ASPIRIN 81 MG PO CHEW: 81.0000 mg | CHEWABLE_TABLET | Freq: Two times a day (BID) | ORAL | 0 refills | Status: DC

## 2015-11-12 MED ORDER — METHOCARBAMOL 500 MG PO TABS: 500.0000 mg | ORAL_TABLET | Freq: Four times a day (QID) | ORAL | 0 refills | Status: DC | PRN

## 2015-11-12 NOTE — Progress Notes (Signed)
Physical Therapy Treatment Patient Details Name: Jacob Collier MRN: [id number] DOB: Feb 23, 1956 Today's Date: 11/12/2015    History of Present Illness 60 y.o. male now s/p Lt anterior THA Prostate cancer, back pain.     PT Comments    Pt continues to make steady progress with mobility. Will continue to follow in anticipation of D/C to home. Will need to attempt stairs prior to D/C.   Follow Up Recommendations  Home health PT;Supervision for mobility/OOB     Equipment Recommendations  Rolling walker with 5" wheels    Recommendations for Other Services       Precautions / Restrictions Precautions Precautions: Fall Restrictions Weight Bearing Restrictions: Yes LLE Weight Bearing: Weight bearing as tolerated    Mobility  Bed Mobility Overal bed mobility: Needs Assistance Bed Mobility: Supine to Sit;Sit to Supine     Supine to sit: Supervision Sit to supine: Supervision   General bed mobility comments: increased difficulty with return to bed but no physical assist needed  Transfers Overall transfer level: Needs assistance Equipment used: Rolling walker (2 wheeled) Transfers: Sit to/from Stand Sit to Stand: Min guard         General transfer comment: cues for hand placement  Ambulation/Gait Ambulation/Gait assistance: Min guard Ambulation Distance (Feet): 160 Feet Assistive device: Rolling walker (2 wheeled) Gait Pattern/deviations: Step-through pattern;Decreased weight shift to left;Decreased step length - right;Decreased step length - left Gait velocity: decreased   General Gait Details: working on increased weightbearing through LLE   Financial trader Rankin (Stroke Patients Only)       Balance Overall balance assessment: Needs assistance Sitting-balance support: No upper extremity supported Sitting balance-Leahy Scale: Good       Standing balance-Leahy Scale: Fair Standing balance comment: able to stand  static without UE support                    Cognition Arousal/Alertness: Awake/alert Behavior During Therapy: WFL for tasks assessed/performed Overall Cognitive Status: Within Functional Limits for tasks assessed                      Exercises      General Comments        Pertinent Vitals/Pain Pain Assessment: Faces Faces Pain Scale: Hurts little more Pain Location: Lt thigh Pain Descriptors / Indicators: Tightness;Aching Pain Intervention(s): Limited activity within patient's tolerance;Monitored during session    Home Living                      Prior Function            PT Goals (current goals can now be found in the care plan section) Acute Rehab PT Goals Patient Stated Goal: get strength back. PT Goal Formulation: With patient Time For Goal Achievement: 11/25/15 Potential to Achieve Goals: Good Progress towards PT goals: Progressing toward goals    Frequency  7X/week    PT Plan Current plan remains appropriate    Co-evaluation             End of Session Equipment Utilized During Treatment: Gait belt Activity Tolerance: Patient tolerated treatment well Patient left: in bed;with call bell/phone within reach     Time: [id number] PT Time Calculation (min) (ACUTE ONLY): 11 min  Charges:  $Gait Training: 8-22 mins  G Codes:      Cassell Clement, PT, CSCS Pager [phone] Office [phone]  11/12/2015, 4:24 PM

## 2015-11-12 NOTE — Progress Notes (Signed)
Subjective: 2 Days Post-Op Procedure(s) (LRB): LEFT TOTAL HIP ARTHROPLASTY ANTERIOR APPROACH (Left) Patient reports pain as moderate.    Objective: Vital signs in last 24 hours: Temp:  [97.2 F (36.2 C)-103.1 F (39.5 C)] 98 F (36.7 C) (09/07 1500) Pulse Rate:  [61-78] 61 (09/07 1500) Resp:  [17-18] 17 (09/07 1500) BP: (118-127)/(60-64) 122/61 (09/07 1500) SpO2:  [94 %-98 %] 98 % (09/07 1500)  Intake/Output from previous day: 09/06 0701 - 09/07 0700 In: 759.5 [P.O.:582; I.V.:177.5] Out: 2225 [Urine:2225] Intake/Output this shift: Total I/O In: 480 [P.O.:480] Out: 350 [Urine:350]   Recent Labs  11/11/15 0537  HGB 11.6*    Recent Labs  11/11/15 0537  WBC 7.4  RBC 4.02*  HCT 37.1*  PLT 150    Recent Labs  11/11/15 0537  NA 136  K 4.0  CL 100*  CO2 24  BUN 14  CREATININE 1.28*  GLUCOSE 107*  CALCIUM 8.0*   No results for input(s): LABPT, INR in the last 72 hours.  Sensation intact distally Intact pulses distally Dorsiflexion/Plantar flexion intact Incision: dressing C/D/I  Assessment/Plan: 2 Days Post-Op Procedure(s) (LRB): LEFT TOTAL HIP ARTHROPLASTY ANTERIOR APPROACH (Left) Up with therapy Plan for discharge tomorrow Discharge home with home health  Mcarthur Rossetti 11/12/2015, 5:41 PM

## 2015-11-12 NOTE — Progress Notes (Signed)
Physical Therapy Treatment Patient Details Name: Jacob Collier MRN: [id number] DOB: 18-Sep-1955 Today's Date: 11/12/2015    History of Present Illness 60 y.o. male now s/p Lt anterior THA Prostate cancer, back pain.     PT Comments    Pt making gradual progress with mobility during PT sessions. Difficulty with weightbearing through LLE during gait. PT to continue to progress as tolerated.   Follow Up Recommendations  Home health PT;Supervision for mobility/OOB     Equipment Recommendations  Rolling walker with 5" wheels    Recommendations for Other Services       Precautions / Restrictions Precautions Precautions: Fall Precaution Comments: no precautions, direct anterior Restrictions Weight Bearing Restrictions: Yes LLE Weight Bearing: Weight bearing as tolerated    Mobility  Bed Mobility Overal bed mobility: Needs Assistance Bed Mobility: Supine to Sit     Supine to sit: Supervision     General bed mobility comments: HOB elevated  Transfers Overall transfer level: Needs assistance Equipment used: Rolling walker (2 wheeled) Transfers: Sit to/from Stand Sit to Stand: Min guard         General transfer comment: cues for hand placement  Ambulation/Gait Ambulation/Gait assistance: Min guard Ambulation Distance (Feet): 125 Feet Assistive device: Rolling walker (2 wheeled) Gait Pattern/deviations: Step-through pattern;Decreased weight shift to left Gait velocity: decreased   General Gait Details: cues for posture, working on even strides and weightbearing.    Stairs            Wheelchair Mobility    Modified Rankin (Stroke Patients Only)       Balance Overall balance assessment: Needs assistance Sitting-balance support: No upper extremity supported Sitting balance-Leahy Scale: Good     Standing balance support: Bilateral upper extremity supported Standing balance-Leahy Scale: Poor Standing balance comment: using rw                     Cognition Arousal/Alertness: Awake/alert Behavior During Therapy: WFL for tasks assessed/performed Overall Cognitive Status: Within Functional Limits for tasks assessed                      Exercises Total Joint Exercises Ankle Circles/Pumps: AROM;Both;10 reps Quad Sets: Strengthening;Left;10 reps Short Arc Quad: Strengthening;Left;10 reps Heel Slides: AAROM;Left;10 reps Hip ABduction/ADduction: Strengthening;Left;10 reps    General Comments        Pertinent Vitals/Pain Pain Assessment: 0-10 Pain Score: 5  Pain Location: Lt thigh Pain Descriptors / Indicators: Tightness;Aching Pain Intervention(s): Limited activity within patient's tolerance;Monitored during session (declined ice)    Home Living                      Prior Function            PT Goals (current goals can now be found in the care plan section) Acute Rehab PT Goals Patient Stated Goal: get strength back. PT Goal Formulation: With patient Time For Goal Achievement: 11/25/15 Potential to Achieve Goals: Good Progress towards PT goals: Progressing toward goals    Frequency  7X/week    PT Plan Current plan remains appropriate    Co-evaluation             End of Session Equipment Utilized During Treatment: Gait belt Activity Tolerance: Patient tolerated treatment well Patient left: in chair;with call bell/phone within reach     Time: [id number] PT Time Calculation (min) (ACUTE ONLY): 24 min  Charges:  $Gait Training: 8-22 mins $Therapeutic Exercise: 8-22 mins  G Codes:      Cassell Clement, PT, CSCS Pager [phone] Office [phone]  11/12/2015, 10:02 AM

## 2015-11-12 NOTE — Plan of Care (Signed)
Problem: Pain Management: Goal: Pain level will decrease with appropriate interventions Outcome: Progressing Medicated once for pain this shift

## 2015-11-12 NOTE — Discharge Instructions (Signed)

## 2015-11-13 NOTE — Plan of Care (Signed)
Problem: Activity: Goal: Risk for activity intolerance will decrease Outcome: Progressing Ambulates to BR with one assistance and tolerates well  Problem: Bowel/Gastric: Goal: Will not experience complications related to bowel motility Outcome: Progressing No bowel issues reported

## 2015-11-13 NOTE — Progress Notes (Signed)
Physical Therapy Treatment Patient Details Name: Jacob Collier MRN: [id number] DOB: 10/07/1955 Today's Date: 11/13/2015    History of Present Illness 60 y.o. male now s/p Lt anterior THA Prostate cancer, back pain.     PT Comments    Pt making steady progress with ambulation and general mobility. Pt requesting rest break before attempting stairs. Anticipate D/C to home with family support. Stairs at next session.   Follow Up Recommendations  Home health PT;Supervision for mobility/OOB     Equipment Recommendations  Rolling walker with 5" wheels    Recommendations for Other Services       Precautions / Restrictions Precautions Precautions: Fall Restrictions Weight Bearing Restrictions: Yes LLE Weight Bearing: Weight bearing as tolerated    Mobility  Bed Mobility Overal bed mobility: Needs Assistance Bed Mobility: Supine to Sit     Supine to sit: Supervision     General bed mobility comments: primary challenge is bringing LLE off edge of bed  Transfers Overall transfer level: Needs assistance Equipment used: Rolling walker (2 wheeled) Transfers: Sit to/from Stand Sit to Stand: Supervision         General transfer comment: cues to use hands to push up from bed.   Ambulation/Gait Ambulation/Gait assistance: Supervision Ambulation Distance (Feet): 300 Feet Assistive device: Rolling walker (2 wheeled) Gait Pattern/deviations: Step-through pattern;Decreased weight shift to left;Decreased stance time - left Gait velocity: decreased   General Gait Details: no loss of balance, cues to work on even weightbearing   Stairs            Wheelchair Mobility    Modified Rankin (Stroke Patients Only)       Balance Overall balance assessment: Needs assistance Sitting-balance support: No upper extremity supported Sitting balance-Leahy Scale: Good     Standing balance support: During functional activity Standing balance-Leahy Scale: Fair Standing balance  comment: able to stand without UE support static                    Cognition Arousal/Alertness: Awake/alert Behavior During Therapy: WFL for tasks assessed/performed Overall Cognitive Status: Within Functional Limits for tasks assessed                      Exercises      General Comments        Pertinent Vitals/Pain Pain Assessment: Faces Faces Pain Scale: Hurts little more Pain Location: Lt hip/thigh Pain Intervention(s): Limited activity within patient's tolerance;Monitored during session    Home Living                      Prior Function            PT Goals (current goals can now be found in the care plan section) Acute Rehab PT Goals Patient Stated Goal: get home PT Goal Formulation: With patient Time For Goal Achievement: 11/25/15 Potential to Achieve Goals: Good Progress towards PT goals: Progressing toward goals    Frequency  7X/week    PT Plan Current plan remains appropriate    Co-evaluation             End of Session Equipment Utilized During Treatment: Gait belt Activity Tolerance: Patient tolerated treatment well Patient left: in bed;with call bell/phone within reach;with family/visitor present     Time: [id number] PT Time Calculation (min) (ACUTE ONLY): 20 min  Charges:  $Gait Training: 8-22 mins  G Codes:      Jacob Collier, PT, CSCS Pager [phone] Office [phone]  11/13/2015, 1:32 PM
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
## 2015-11-13 NOTE — Progress Notes (Signed)
PT Progress Note  Patient is making good progress with PT.  From a mobility standpoint anticipate patient will be ready for DC home with family support. Patient denies any questions or concerns.    11/13/15 1421  PT Visit Information  Last PT Received On 11/13/15  Assistance Needed +1  History of Present Illness 60 y.o. male now s/p Lt anterior THA Prostate cancer, back pain.   Subjective Data  Subjective Ready to get the stairs done.  Patient Stated Goal get home  Precautions  Precautions Fall  Restrictions  Weight Bearing Restrictions Yes  LLE Weight Bearing WBAT  Pain Assessment  Pain Assessment Faces  Faces Pain Scale 4  Pain Location Lt hip/thigh  Pain Intervention(s) Limited activity within patient's tolerance;Monitored during session  Cognition  Arousal/Alertness Awake/alert  Behavior During Therapy [facility] for tasks assessed/performed  Overall Cognitive Status Within Functional Limits for tasks assessed  Bed Mobility  Overal bed mobility Needs Assistance  Bed Mobility Supine to Sit;Sit to Supine  Supine to sit Supervision  Sit to supine Supervision  Transfers  Overall transfer level Needs assistance  Equipment used Rolling walker (2 wheeled)  Transfers Sit to/from Stand  Sit to Stand Supervision  General transfer comment improved use of hands  Ambulation/Gait  Ambulation/Gait assistance Supervision  Ambulation Distance (Feet) 150 Feet  Assistive device Rolling walker (2 wheeled)  Gait Pattern/deviations Step-through pattern  General Gait Details stable pattern,cues for even strides and weightbearing  Gait velocity decreased  Stairs Yes  Stairs assistance Min guard  Stair Management One rail Right;Step to pattern;Forwards  Number of Stairs 12  General stair comments steady pattern, pt reports feeling confident with ability to perform at home.   Balance  Overall balance assessment Needs assistance  Sitting-balance support No upper extremity supported  Sitting  balance-Leahy Scale Good  Standing balance support During functional activity  Standing balance-Leahy Scale Fair  General Comments  General comments (skin integrity, edema, etc.) verbal/demonstration of HEP performed with pt and spouse. Pt declined performaing at this time. Both deny questions following session. Handout provided.   PT - End of Session  Equipment Utilized During Treatment Gait belt  Activity Tolerance Patient tolerated treatment well  Patient left in bed;with family/visitor present;with call bell/phone within reach  Nurse Communication Mobility status;Weight bearing status  PT - Assessment/Plan  PT Plan Current plan remains appropriate  PT Frequency (ACUTE ONLY) 7X/week  Follow Up Recommendations Home health PT;Supervision for mobility/OOB  PT equipment Rolling walker with 5" wheels  PT Goal Progression  Progress towards PT goals Progressing toward goals  Acute Rehab PT Goals  PT Goal Formulation With patient  Time For Goal Achievement 11/25/15  Potential to Achieve Goals Good  PT Time Calculation  PT Start Time (ACUTE ONLY) 1156  PT Stop Time (ACUTE ONLY) 1213  PT Time Calculation (min) (ACUTE ONLY) 17 min  PT General Charges  $$ ACUTE PT VISIT 1 Procedure  PT Treatments  $Gait Training 8-22 mins  Cassell Clement, PT, [place] Pager [phone] Office [phone]

## 2015-11-13 NOTE — Care Management Note (Signed)
Case Management Note  Patient Details  Name: Logic Hoock MRN: [id number] Date of Birth: 1955-04-05  Subjective/Objective:   60 yr old gentleman s/p left total  Hip, anterior approach.      Action/Plan: Case manager spoke with patient and wife concerning home health and DME needs. Patient was preoperatively setup with [facility], no changes. Rolling walker and 3in1 have been ordered. Patient will have family support at discharge.   Expected Discharge Date:   11/13/15               Expected Discharge Plan:  [place]  In-House Referral:     Discharge planning Services  CM Consult  Post Acute Care Choice:  Durable Medical Equipment, Home Health Choice offered to:  Patient  DME Arranged:  3-N-1, Walker rolling DME Agency:  [place]:  PT [place]:  [facility] (now [facility])  Status of Service:  Completed, signed off  If discussed at [employer] of Stay Meetings, dates discussed:    Additional Comments:  Ninfa Meeker, RN 11/13/2015, 12:35 PM

## 2015-11-13 NOTE — Progress Notes (Signed)
Patient ID: Jacob Collier, male   DOB: Apr 17, 1955, 60 y.o.   MRN: [id number] Doing well.  Can be discharged to home today.

## 2015-12-30 ENCOUNTER — Ambulatory Visit (INDEPENDENT_AMBULATORY_CARE_PROVIDER_SITE_OTHER): Payer: Commercial Managed Care - PPO | Admitting: Orthopaedic Surgery

## 2016-06-01 ENCOUNTER — Encounter: Payer: Self-pay | Admitting: Radiation Oncology

## 2016-06-21 ENCOUNTER — Encounter: Payer: Self-pay | Admitting: Radiation Oncology

## 2016-06-21 NOTE — Progress Notes (Signed)
GU Location of Tumor / Histology: prostatic adenocarcinoma  If Prostate Cancer, Gleason Score is (4 + 3) and PSA is (5.65) at time of diagnosis.   Jacob Collier had  radical prostatectomy 08/14/2013. PSA was undetectable until January 2018 when it was 0.3. Patient reports his PCP discovered the elevated PSA. Reports his PSA was rechecked by dr. Karsten Ro and proved to be 0.26.     Past/Anticipated interventions by urology, if any: prostatectomy, encouraged to stop testosterone injections, referral to Dr. Tammi Klippel  Past/Anticipated interventions by medical oncology, if any: no  Weight changes, if any: no  Bowel/Bladder complaints, if any: reports ED, urgency, nocturia x 2-3, leakage.Denies dysuria or hematuria.  Reports he understands that his large intestines has adhered to his bladder and creating irregular bowel movements.   Nausea/Vomiting, if any: no  Pain issues, if any:  Yes following left hip replacement last September. Reports pain in the SI joint and kidney area.   SAFETY ISSUES:  Prior radiation? ? [facility] note read that he has a hx of radiation but, patient DENIES a history of radiation  Pacemaker/ICD? no  Possible current pregnancy? no  Is the patient on methotrexate? no  Current Complaints / other details:  61 year old male. Married. Suffers from anxiety and depression surround recurrence.  Reports he has been off testosterone injections for six weeks. Reports lethargy as a result. Requesting his PSA be checked again today.

## 2016-06-22 ENCOUNTER — Ambulatory Visit
Admission: RE | Admit: 2016-06-22 | Discharge: 2016-06-22 | Disposition: A | Discharge status: Home / Self Care | Payer: Managed Care, Other (non HMO) | Source: Ambulatory Visit | Attending: Radiation Oncology | Admitting: Radiation Oncology

## 2016-06-22 ENCOUNTER — Encounter: Payer: Self-pay | Admitting: Radiation Oncology

## 2016-06-22 VITALS — BP 158/78 | HR 47 | Resp 16 | Ht 72.0 in | Wt 208.0 lb

## 2016-06-22 DIAGNOSIS — F411 Generalized anxiety disorder: Secondary | ICD-10-CM | POA: Diagnosis not present

## 2016-06-22 DIAGNOSIS — E291 Testicular hypofunction: Secondary | ICD-10-CM

## 2016-06-22 DIAGNOSIS — Z96642 Presence of left artificial hip joint: Secondary | ICD-10-CM | POA: Diagnosis not present

## 2016-06-22 DIAGNOSIS — Z833 Family history of diabetes mellitus: Secondary | ICD-10-CM | POA: Insufficient documentation

## 2016-06-22 DIAGNOSIS — Z9079 Acquired absence of other genital organ(s): Secondary | ICD-10-CM | POA: Insufficient documentation

## 2016-06-22 DIAGNOSIS — C61 Malignant neoplasm of prostate: Secondary | ICD-10-CM

## 2016-06-22 DIAGNOSIS — Z823 Family history of stroke: Secondary | ICD-10-CM | POA: Insufficient documentation

## 2016-06-22 DIAGNOSIS — Z79899 Other long term (current) drug therapy: Secondary | ICD-10-CM | POA: Insufficient documentation

## 2016-06-22 DIAGNOSIS — M1990 Unspecified osteoarthritis, unspecified site: Secondary | ICD-10-CM | POA: Diagnosis not present

## 2016-06-22 DIAGNOSIS — Z510 Encounter for antineoplastic radiation therapy: Secondary | ICD-10-CM | POA: Insufficient documentation

## 2016-06-22 DIAGNOSIS — H9190 Unspecified hearing loss, unspecified ear: Secondary | ICD-10-CM | POA: Diagnosis not present
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
## 2016-06-22 NOTE — Progress Notes (Signed)
See progress note under physician encounter. 

## 2016-06-23 LAB — TESTOSTERONE: Testosterone, Serum: 84 ng/dL — ABNORMAL LOW (ref 264–916)

## 2016-06-23 LAB — PSA: Prostate Specific Ag, Serum: <0.1 ng/mL (ref 0.0–4.0)

## 2016-06-30 ENCOUNTER — Encounter: Payer: Self-pay | Admitting: *Deleted

## 2016-06-30 NOTE — Progress Notes (Signed)
[place] Psychosocial Distress Screening Clinical Social Work  Clinical Social Work was referred by distress screening protocol.  The patient scored a 5 on the Psychosocial Distress Thermometer which indicates moderate distress. Clinical Social Worker attempted to contact patient to assess for distress and other psychosocial needs. CSW left VM to return call.  ONCBCN DISTRESS SCREENING 06/22/2016  Screening Type Initial Screening  Distress experienced in past week (1-10) 5  Emotional problem type Nervousness/Anxiety;Adjusting to illness  Spiritual/Religous concerns type Facing my mortality  Physical Problem type Pain;Sleep/insomnia  Physician notified of physical symptoms Yes  Referral to clinical psychology No  Referral to clinical social work Yes  Referral to dietition No  Referral to financial advocate No  Referral to support programs No  Referral to palliative care No    [place], MSW, LCSW, OSW-C Clinical Social Worker [place] [phone]

## 2016-07-01 ENCOUNTER — Encounter: Payer: Self-pay | Admitting: Medical Oncology

## 2016-07-01 ENCOUNTER — Ambulatory Visit
Admission: RE | Admit: 2016-07-01 | Discharge: 2016-07-01 | Disposition: A | Discharge status: Home / Self Care | Payer: Managed Care, Other (non HMO) | Source: Ambulatory Visit | Attending: Radiation Oncology | Admitting: Radiation Oncology

## 2016-07-01 ENCOUNTER — Other Ambulatory Visit: Payer: Self-pay | Admitting: Urology

## 2016-07-01 DIAGNOSIS — R5383 Other fatigue: Secondary | ICD-10-CM

## 2016-07-01 DIAGNOSIS — Z510 Encounter for antineoplastic radiation therapy: Secondary | ICD-10-CM | POA: Diagnosis not present

## 2016-07-01 DIAGNOSIS — C61 Malignant neoplasm of prostate: Secondary | ICD-10-CM

## 2016-07-03 NOTE — Progress Notes (Signed)
  Radiation Oncology         [phone] ________________________________  Name: Jacob Collier MRN: [id number]  Date: 07/01/2016  DOB: 1956-01-30  SIMULATION AND TREATMENT PLANNING NOTE    ICD-9-CM ICD-10-CM   1. Prostate cancer ([place]) 47 C61     DIAGNOSIS:  61 y.o. gentleman with a biochemical recurrence of adenocarcinoma of the prostate (pT3a, pN0) Gleason Score of 4+3 and pre-operative PSA of 5.69 and post-op detectable PSA as high as 0.3  NARRATIVE:  The patient was brought to the [facility].  Identity was confirmed.  All relevant records and images related to the planned course of therapy were reviewed.  The patient freely provided informed written consent to proceed with treatment after reviewing the details related to the planned course of therapy. The consent form was witnessed and verified by the simulation staff.  Then, the patient was set-up in a stable reproducible supine position for radiation therapy.  A vacuum lock pillow device was custom fabricated to position his legs in a reproducible immobilized position.  Then, I performed a urethrogram under sterile conditions to identify the prostatic apex.  CT images were obtained.  Surface markings were placed.  The CT images were loaded into the planning software.  Then the prostate target and avoidance structures including the rectum, bladder, bowel and hips were contoured.  Treatment planning then occurred.  The radiation prescription was entered and confirmed.  A total of 1 complex treatment devices were fabricated. I have requested : Intensity Modulated Radiotherapy (IMRT) is medically necessary for this case for the following reason:  Rectal sparing.[name]  PLAN:  The patient will receive 68.4 Gy in 38 fractions.  ________________________________  Sheral Apley Tammi Klippel, M.D.

## 2016-07-04 ENCOUNTER — Telehealth: Payer: Self-pay | Admitting: *Deleted

## 2016-07-04 NOTE — Telephone Encounter (Signed)
Called patient to inform of appt. with Dr. Debara Pickett on 07-05-16- arrival time - 11:05 am, lvm for a return call

## 2016-07-05 ENCOUNTER — Encounter: Payer: Self-pay | Admitting: Internal Medicine

## 2016-07-05 ENCOUNTER — Ambulatory Visit (INDEPENDENT_AMBULATORY_CARE_PROVIDER_SITE_OTHER): Payer: Managed Care, Other (non HMO) | Admitting: Internal Medicine

## 2016-07-05 VITALS — BP 142/80 | HR 48 | Ht 71.0 in | Wt 206.0 lb

## 2016-07-05 DIAGNOSIS — R0600 Dyspnea, unspecified: Secondary | ICD-10-CM

## 2016-07-05 DIAGNOSIS — R5383 Other fatigue: Secondary | ICD-10-CM | POA: Diagnosis not present

## 2016-07-05 DIAGNOSIS — C61 Malignant neoplasm of prostate: Secondary | ICD-10-CM

## 2016-07-05 DIAGNOSIS — R001 Bradycardia, unspecified: Secondary | ICD-10-CM
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
## 2016-07-05 NOTE — Patient Instructions (Addendum)
Your physician has requested that you have an echocardiogram. Echocardiography is a painless test that uses sound waves to create images of your heart. It provides your doctor with information about the size and shape of your heart and how well your heart's chambers and valves are working. This procedure takes approximately one hour. There are no restrictions for this procedure.  Your physician has requested that you have en exercise stress myoview. For further information please visit [email or web address]. Please follow instruction sheet, as given.  Your physician recommends that you schedule a follow-up appointment after your testing.

## 2016-07-06 DIAGNOSIS — Z510 Encounter for antineoplastic radiation therapy: Secondary | ICD-10-CM | POA: Diagnosis not present

## 2016-07-07 ENCOUNTER — Telehealth (HOSPITAL_COMMUNITY): Payer: Self-pay

## 2016-07-07 NOTE — Telephone Encounter (Signed)
Encounter complete. 

## 2016-07-08 ENCOUNTER — Other Ambulatory Visit: Payer: Self-pay

## 2016-07-08 ENCOUNTER — Ambulatory Visit (HOSPITAL_COMMUNITY): Payer: Managed Care, Other (non HMO) | Attending: Cardiology

## 2016-07-08 ENCOUNTER — Telehealth (HOSPITAL_COMMUNITY): Payer: Self-pay

## 2016-07-08 DIAGNOSIS — R0600 Dyspnea, unspecified: Secondary | ICD-10-CM | POA: Insufficient documentation

## 2016-07-08 DIAGNOSIS — I081 Rheumatic disorders of both mitral and tricuspid valves: Secondary | ICD-10-CM | POA: Diagnosis not present

## 2016-07-08 DIAGNOSIS — R5383 Other fatigue: Secondary | ICD-10-CM | POA: Diagnosis not present

## 2016-07-08 NOTE — Telephone Encounter (Signed)
Encounter complete. 

## 2016-07-11 ENCOUNTER — Telehealth: Payer: Self-pay | Admitting: Radiation Oncology

## 2016-07-11 NOTE — Telephone Encounter (Signed)
Phoned patient's cell to inform him that per Dr. Debara Pickett his radiation treatments have been delayed until 07/25/16 at 1145. No answer. Left detailed information reference delay and requested patient phone this RN back to confirm.

## 2016-07-11 NOTE — Telephone Encounter (Signed)
-----   Message from Tyler Pita, MD sent at 07/09/2016  2:17 PM EDT ----- Yes, we'll delay his start.  I am copying my team to notify the treatment team about the planned delay.  MM  ----- Message ----- From: Pixie Casino, MD Sent: 07/05/2016   6:56 PM To: Tyler Pita, MD  Dr. Tammi Klippel- Mr. Cookston is being evaluated for fatigue and progressive dyspnea - symptoms could represent coronary disease. I would like to evaluate him for this before starting radiation treatments if that is okay. We should be able to get the testing done by next week, but he is scheduled to start on May 8th. FYI. Is it ok for him to hold off another week? -[place]

## 2016-07-12 ENCOUNTER — Ambulatory Visit: Payer: Managed Care, Other (non HMO) | Admitting: Radiation Oncology

## 2016-07-12 ENCOUNTER — Ambulatory Visit (HOSPITAL_COMMUNITY)
Admission: RE | Admit: 2016-07-12 | Discharge: 2016-07-12 | Disposition: A | Discharge status: Home / Self Care | Payer: Managed Care, Other (non HMO) | Source: Ambulatory Visit | Attending: Cardiology | Admitting: Cardiology

## 2016-07-12 DIAGNOSIS — R5383 Other fatigue: Secondary | ICD-10-CM | POA: Insufficient documentation

## 2016-07-12 DIAGNOSIS — R0600 Dyspnea, unspecified: Secondary | ICD-10-CM | POA: Diagnosis not present

## 2016-07-12 DIAGNOSIS — I2510 Atherosclerotic heart disease of native coronary artery without angina pectoris: Secondary | ICD-10-CM | POA: Diagnosis present

## 2016-07-12 DIAGNOSIS — Z8249 Family history of ischemic heart disease and other diseases of the circulatory system: Secondary | ICD-10-CM | POA: Diagnosis not present

## 2016-07-12 DIAGNOSIS — R0602 Shortness of breath: Secondary | ICD-10-CM | POA: Insufficient documentation

## 2016-07-12 DIAGNOSIS — R0609 Other forms of dyspnea: Secondary | ICD-10-CM | POA: Diagnosis not present

## 2016-07-12 LAB — MYOCARDIAL PERFUSION IMAGING
LV dias vol: 82 mL (ref 62–150)
LV sys vol: 150 mL
Peak HR: 108 {beats}/min
Rest HR: 44 {beats}/min
SDS: 1
SRS: 9
SSS: 10
TID: 1.12

## 2016-07-12 MED ORDER — REGADENOSON 0.4 MG/5ML IV SOLN
0.4000 mg | Freq: Once | INTRAVENOUS | Status: AC
  Administered 2016-07-12: 0.4 mg via INTRAVENOUS

## 2016-07-12 MED ORDER — TECHNETIUM TC 99M TETROFOSMIN IV KIT
11.0000 | PACK | Freq: Once | INTRAVENOUS | Status: AC | PRN
  Administered 2016-07-12: 11 via INTRAVENOUS
  Filled 2016-07-12: qty 11

## 2016-07-12 MED ORDER — TECHNETIUM TC 99M TETROFOSMIN IV KIT
30.4000 | PACK | Freq: Once | INTRAVENOUS | Status: AC | PRN
  Administered 2016-07-12: 30.4 via INTRAVENOUS
  Filled 2016-07-12: qty 31

## 2016-07-12 MED ORDER — AMINOPHYLLINE 25 MG/ML IV SOLN
75.0000 mg | Freq: Once | INTRAVENOUS | Status: AC
  Administered 2016-07-12: 75 mg via INTRAVENOUS

## 2016-07-13 ENCOUNTER — Ambulatory Visit: Payer: Managed Care, Other (non HMO)

## 2016-07-13 ENCOUNTER — Telehealth: Payer: Self-pay | Admitting: Internal Medicine

## 2016-07-13 NOTE — Telephone Encounter (Signed)
Left msg to call & that I will reattempt to reach patient when I return to office tomorrow.

## 2016-07-13 NOTE — Telephone Encounter (Signed)
Follow Up:; ° ° °Returning your call. °

## 2016-07-14 ENCOUNTER — Ambulatory Visit: Payer: Managed Care, Other (non HMO)

## 2016-07-14 NOTE — Telephone Encounter (Signed)
This is in regards to stress test & echo results Dr. Debara Pickett made notes on.

## 2016-07-14 NOTE — Telephone Encounter (Signed)
I have discussed w patient, see result notes.

## 2016-07-14 NOTE — Telephone Encounter (Signed)
Additional msg left to call.

## 2016-07-15 ENCOUNTER — Ambulatory Visit: Payer: Managed Care, Other (non HMO)

## 2016-07-18 ENCOUNTER — Ambulatory Visit: Payer: Managed Care, Other (non HMO)

## 2016-07-18 ENCOUNTER — Ambulatory Visit (INDEPENDENT_AMBULATORY_CARE_PROVIDER_SITE_OTHER): Payer: Managed Care, Other (non HMO) | Admitting: Internal Medicine

## 2016-07-18 ENCOUNTER — Encounter: Payer: Self-pay | Admitting: Internal Medicine

## 2016-07-18 VITALS — BP 132/76 | HR 56 | Ht 71.0 in | Wt 211.8 lb

## 2016-07-18 DIAGNOSIS — R5383 Other fatigue: Secondary | ICD-10-CM | POA: Diagnosis not present

## 2016-07-18 DIAGNOSIS — C61 Malignant neoplasm of prostate: Secondary | ICD-10-CM

## 2016-07-18 DIAGNOSIS — R001 Bradycardia, unspecified: Secondary | ICD-10-CM

## 2016-07-18 DIAGNOSIS — R0600 Dyspnea, unspecified: Secondary | ICD-10-CM | POA: Diagnosis not present
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
## 2016-07-18 NOTE — Patient Instructions (Signed)
Your physician recommends that you schedule a follow-up appointment as needed  

## 2016-07-19 ENCOUNTER — Ambulatory Visit: Payer: Managed Care, Other (non HMO)

## 2016-07-20 ENCOUNTER — Ambulatory Visit: Payer: Managed Care, Other (non HMO)

## 2016-07-21 ENCOUNTER — Ambulatory Visit: Payer: Managed Care, Other (non HMO)

## 2016-07-22 ENCOUNTER — Ambulatory Visit: Payer: Managed Care, Other (non HMO)

## 2016-07-25 ENCOUNTER — Ambulatory Visit
Admission: RE | Admit: 2016-07-25 | Discharge: 2016-07-25 | Disposition: A | Discharge status: Home / Self Care | Payer: Managed Care, Other (non HMO) | Source: Ambulatory Visit | Attending: Radiation Oncology | Admitting: Radiation Oncology

## 2016-07-25 DIAGNOSIS — Z510 Encounter for antineoplastic radiation therapy: Secondary | ICD-10-CM | POA: Diagnosis not present

## 2016-07-26 ENCOUNTER — Ambulatory Visit
Admission: RE | Admit: 2016-07-26 | Discharge: 2016-07-26 | Disposition: A | Discharge status: Home / Self Care | Payer: Managed Care, Other (non HMO) | Source: Ambulatory Visit | Attending: Radiation Oncology | Admitting: Radiation Oncology

## 2016-07-26 DIAGNOSIS — Z510 Encounter for antineoplastic radiation therapy: Secondary | ICD-10-CM | POA: Diagnosis not present

## 2016-07-27 ENCOUNTER — Ambulatory Visit
Admission: RE | Admit: 2016-07-27 | Discharge: 2016-07-27 | Disposition: A | Discharge status: Home / Self Care | Payer: Managed Care, Other (non HMO) | Source: Ambulatory Visit | Attending: Radiation Oncology | Admitting: Radiation Oncology

## 2016-07-27 DIAGNOSIS — Z510 Encounter for antineoplastic radiation therapy: Secondary | ICD-10-CM | POA: Diagnosis not present

## 2016-07-28 ENCOUNTER — Ambulatory Visit
Admission: RE | Admit: 2016-07-28 | Discharge: 2016-07-28 | Disposition: A | Discharge status: Home / Self Care | Payer: Managed Care, Other (non HMO) | Source: Ambulatory Visit | Attending: Radiation Oncology | Admitting: Radiation Oncology

## 2016-07-28 ENCOUNTER — Other Ambulatory Visit: Payer: Self-pay | Admitting: Urology

## 2016-07-28 DIAGNOSIS — Z510 Encounter for antineoplastic radiation therapy: Secondary | ICD-10-CM | POA: Diagnosis not present

## 2016-07-28 DIAGNOSIS — C61 Malignant neoplasm of prostate: Secondary | ICD-10-CM

## 2016-07-29 ENCOUNTER — Ambulatory Visit
Admission: RE | Admit: 2016-07-29 | Discharge: 2016-07-29 | Disposition: A | Discharge status: Home / Self Care | Payer: Managed Care, Other (non HMO) | Source: Ambulatory Visit | Attending: Radiation Oncology | Admitting: Radiation Oncology

## 2016-07-29 DIAGNOSIS — Z510 Encounter for antineoplastic radiation therapy: Secondary | ICD-10-CM | POA: Diagnosis not present

## 2016-08-02 ENCOUNTER — Telehealth: Payer: Self-pay | Admitting: Radiation Oncology

## 2016-08-02 ENCOUNTER — Ambulatory Visit: Payer: Managed Care, Other (non HMO)

## 2016-08-02 NOTE — Telephone Encounter (Signed)
Received word from the therapist the patient cancelled radiation treatment today due to nausea. Phoned patient at home to inquire. No answer. Left message requesting return call.

## 2016-08-03 ENCOUNTER — Ambulatory Visit
Admission: RE | Admit: 2016-08-03 | Discharge: 2016-08-03 | Disposition: A | Discharge status: Home / Self Care | Payer: Managed Care, Other (non HMO) | Source: Ambulatory Visit | Attending: Radiation Oncology | Admitting: Radiation Oncology

## 2016-08-03 DIAGNOSIS — C61 Malignant neoplasm of prostate: Secondary | ICD-10-CM

## 2016-08-03 DIAGNOSIS — Z510 Encounter for antineoplastic radiation therapy: Secondary | ICD-10-CM | POA: Diagnosis not present

## 2016-08-04 ENCOUNTER — Ambulatory Visit
Admission: RE | Admit: 2016-08-04 | Discharge: 2016-08-04 | Disposition: A | Discharge status: Home / Self Care | Payer: Managed Care, Other (non HMO) | Source: Ambulatory Visit | Attending: Radiation Oncology | Admitting: Radiation Oncology

## 2016-08-04 DIAGNOSIS — Z510 Encounter for antineoplastic radiation therapy: Secondary | ICD-10-CM | POA: Diagnosis not present

## 2016-08-04 LAB — PSA: Prostate Specific Ag, Serum: 0.1 ng/mL (ref 0.0–4.0)

## 2016-08-05 ENCOUNTER — Encounter: Payer: Self-pay | Admitting: Medical Oncology

## 2016-08-05 ENCOUNTER — Ambulatory Visit
Admission: RE | Admit: 2016-08-05 | Discharge: 2016-08-05 | Disposition: A | Discharge status: Home / Self Care | Payer: Managed Care, Other (non HMO) | Source: Ambulatory Visit | Attending: Radiation Oncology | Admitting: Radiation Oncology

## 2016-08-05 DIAGNOSIS — Z510 Encounter for antineoplastic radiation therapy: Secondary | ICD-10-CM | POA: Diagnosis not present

## 2016-08-05 NOTE — Progress Notes (Signed)
Jacob Collier states he is tolerating radiation well. He did not feel well earlier in the week but states the symptoms were not related to radiation. He states after his prostatectomy, he has issues with scar tissue involving his colon and bladder. I will continue to follow and asked him to call me with questions or concerns. He voiced understanding.

## 2016-08-08 ENCOUNTER — Ambulatory Visit
Admission: RE | Admit: 2016-08-08 | Discharge: 2016-08-08 | Disposition: A | Discharge status: Home / Self Care | Payer: Managed Care, Other (non HMO) | Source: Ambulatory Visit | Attending: Radiation Oncology | Admitting: Radiation Oncology

## 2016-08-08 DIAGNOSIS — Z510 Encounter for antineoplastic radiation therapy: Secondary | ICD-10-CM | POA: Diagnosis not present

## 2016-08-09 ENCOUNTER — Telehealth: Payer: Self-pay | Admitting: Radiation Oncology

## 2016-08-09 ENCOUNTER — Ambulatory Visit
Admission: RE | Admit: 2016-08-09 | Discharge: 2016-08-09 | Disposition: A | Discharge status: Home / Self Care | Payer: Managed Care, Other (non HMO) | Source: Ambulatory Visit | Attending: Radiation Oncology | Admitting: Radiation Oncology

## 2016-08-09 DIAGNOSIS — Z510 Encounter for antineoplastic radiation therapy: Secondary | ICD-10-CM | POA: Diagnosis not present

## 2016-08-09 NOTE — Telephone Encounter (Signed)
Phoned patient to review PSA results. No answer. Left message requesting return call.

## 2016-08-09 NOTE — Telephone Encounter (Signed)
-----   Message from Freeman Caldron, [place] sent at 08/04/2016  5:40 PM EDT ----- Regarding: PSA results [name], Please call patient with PSA results to let him know that the PSA is minimally, but is detectable since he resumed using testosterone.  This confirms that the prostate cancer is still sensitive to testosterone and continuing the use of testosterone will likely increase his risk of disease recurrence and/or treatment failure.   -Ashlyn ----- Message ----- From: Interface, Lab In Three Zero One Sent: 08/04/2016   3:38 AM To: Freeman Caldron, PA-C

## 2016-08-09 NOTE — Telephone Encounter (Signed)
Per Ashlyn Bruning PA-C phoned patient and explained his PSA is 0.01. Stressed that though his PSA is minimally detectable it is still indicative that his prostate cancer is sensitive to testosterone. Explained that testosterone will likely increase his risk of disease recurrence and/or treatment failure. Patient verbalized understanding. He expressed his intentions to continue testosterone and continue radiation with the hope the radiation would "kills off the cancer cells the testosterone was creating." Patient expressed appreciation for the call.

## 2016-08-10 ENCOUNTER — Ambulatory Visit
Admission: RE | Admit: 2016-08-10 | Discharge: 2016-08-10 | Disposition: A | Discharge status: Home / Self Care | Payer: Managed Care, Other (non HMO) | Source: Ambulatory Visit | Attending: Radiation Oncology | Admitting: Radiation Oncology

## 2016-08-10 DIAGNOSIS — Z510 Encounter for antineoplastic radiation therapy: Secondary | ICD-10-CM | POA: Diagnosis not present

## 2016-08-10 NOTE — Progress Notes (Addendum)
weekly rad txs prostate 11/38 complettd, no bladder issues, has good stream, irregular bowels for 3 years since surgery stated, nopain, appetite good, but has some fatigue stated 11:45 AM BP 128/82   Pulse (!) 53   Temp 98 F (36.7 C) (Oral)   Resp 20   Wt 218 lb 12.8 oz (99.2 kg)   BMI 30.52 kg/m   Wt Readings from Last 3 Encounters:  08/10/16 218 lb 12.8 oz (99.2 kg)  07/18/16 211 lb 12.8 oz (96.1 kg)  07/12/16 206 lb (93.4 kg)

## 2016-08-11 ENCOUNTER — Ambulatory Visit: Payer: Managed Care, Other (non HMO)

## 2016-08-12 ENCOUNTER — Ambulatory Visit
Admission: RE | Admit: 2016-08-12 | Discharge: 2016-08-12 | Disposition: A | Discharge status: Home / Self Care | Payer: Managed Care, Other (non HMO) | Source: Ambulatory Visit | Attending: Radiation Oncology | Admitting: Radiation Oncology

## 2016-08-12 DIAGNOSIS — Z510 Encounter for antineoplastic radiation therapy: Secondary | ICD-10-CM | POA: Diagnosis not present

## 2016-08-15 ENCOUNTER — Ambulatory Visit
Admission: RE | Admit: 2016-08-15 | Discharge: 2016-08-15 | Disposition: A | Discharge status: Home / Self Care | Payer: Managed Care, Other (non HMO) | Source: Ambulatory Visit | Attending: Radiation Oncology | Admitting: Radiation Oncology

## 2016-08-15 DIAGNOSIS — Z510 Encounter for antineoplastic radiation therapy: Secondary | ICD-10-CM | POA: Diagnosis not present

## 2016-08-16 ENCOUNTER — Ambulatory Visit
Admission: RE | Admit: 2016-08-16 | Discharge: 2016-08-16 | Disposition: A | Discharge status: Home / Self Care | Payer: Managed Care, Other (non HMO) | Source: Ambulatory Visit | Attending: Radiation Oncology | Admitting: Radiation Oncology

## 2016-08-16 DIAGNOSIS — Z510 Encounter for antineoplastic radiation therapy: Secondary | ICD-10-CM | POA: Diagnosis not present

## 2016-08-17 ENCOUNTER — Ambulatory Visit
Admission: RE | Admit: 2016-08-17 | Discharge: 2016-08-17 | Disposition: A | Discharge status: Home / Self Care | Payer: Managed Care, Other (non HMO) | Source: Ambulatory Visit | Attending: Radiation Oncology | Admitting: Radiation Oncology

## 2016-08-17 DIAGNOSIS — Z510 Encounter for antineoplastic radiation therapy: Secondary | ICD-10-CM | POA: Diagnosis not present

## 2016-08-18 ENCOUNTER — Ambulatory Visit
Admission: RE | Admit: 2016-08-18 | Discharge: 2016-08-18 | Disposition: A | Discharge status: Home / Self Care | Payer: Managed Care, Other (non HMO) | Source: Ambulatory Visit | Attending: Radiation Oncology | Admitting: Radiation Oncology

## 2016-08-18 DIAGNOSIS — Z510 Encounter for antineoplastic radiation therapy: Secondary | ICD-10-CM | POA: Diagnosis not present

## 2016-08-19 ENCOUNTER — Ambulatory Visit
Admission: RE | Admit: 2016-08-19 | Discharge: 2016-08-19 | Disposition: A | Discharge status: Home / Self Care | Payer: Managed Care, Other (non HMO) | Source: Ambulatory Visit | Attending: Radiation Oncology | Admitting: Radiation Oncology

## 2016-08-19 DIAGNOSIS — Z510 Encounter for antineoplastic radiation therapy: Secondary | ICD-10-CM | POA: Diagnosis not present

## 2016-08-22 ENCOUNTER — Encounter: Payer: Self-pay | Admitting: Radiation Oncology

## 2016-08-22 ENCOUNTER — Encounter: Payer: Self-pay | Admitting: Medical Oncology

## 2016-08-22 ENCOUNTER — Ambulatory Visit
Admission: RE | Admit: 2016-08-22 | Discharge: 2016-08-22 | Disposition: A | Discharge status: Home / Self Care | Payer: Managed Care, Other (non HMO) | Source: Ambulatory Visit | Attending: Radiation Oncology | Admitting: Radiation Oncology

## 2016-08-22 ENCOUNTER — Telehealth: Payer: Self-pay | Admitting: Radiation Oncology

## 2016-08-22 DIAGNOSIS — Z510 Encounter for antineoplastic radiation therapy: Secondary | ICD-10-CM | POA: Diagnosis not present

## 2016-08-22 NOTE — Telephone Encounter (Signed)
Phoned patient. No answer. Left message requesting return call. Need a few questions answered in order to complete his [place] paperwork. Awaiting return call.

## 2016-08-22 NOTE — Progress Notes (Signed)
PAPERWORK ([employer]LIFE) RECEIVED BY PT 08/22/16, GIVEN TO NURSE 08/22/16

## 2016-08-23 ENCOUNTER — Ambulatory Visit
Admission: RE | Admit: 2016-08-23 | Discharge: 2016-08-23 | Disposition: A | Discharge status: Home / Self Care | Payer: Managed Care, Other (non HMO) | Source: Ambulatory Visit | Attending: Radiation Oncology | Admitting: Radiation Oncology

## 2016-08-23 DIAGNOSIS — Z510 Encounter for antineoplastic radiation therapy: Secondary | ICD-10-CM | POA: Diagnosis not present

## 2016-08-24 ENCOUNTER — Ambulatory Visit
Admission: RE | Admit: 2016-08-24 | Discharge: 2016-08-24 | Disposition: A | Discharge status: Home / Self Care | Payer: Managed Care, Other (non HMO) | Source: Ambulatory Visit | Attending: Radiation Oncology | Admitting: Radiation Oncology

## 2016-08-24 DIAGNOSIS — Z510 Encounter for antineoplastic radiation therapy: Secondary | ICD-10-CM | POA: Diagnosis not present

## 2016-08-25 ENCOUNTER — Ambulatory Visit: Payer: Managed Care, Other (non HMO)

## 2016-08-25 ENCOUNTER — Telehealth: Payer: Self-pay | Admitting: Radiation Oncology

## 2016-08-25 NOTE — Telephone Encounter (Signed)
Received voicemail message from patient reporting he is unable to present for radiation treatment today because "he got overheated at work last night and feels bad." Phoned patient back to inquire. No answer. Left message requesting return call to assess status and to attempt again to obtain answers needed to complete FMLA paperwork. Awaiting return call. Informed Miranda, RT of this finding.

## 2016-08-26 ENCOUNTER — Ambulatory Visit
Admission: RE | Admit: 2016-08-26 | Discharge: 2016-08-26 | Disposition: A | Discharge status: Home / Self Care | Payer: Managed Care, Other (non HMO) | Source: Ambulatory Visit | Attending: Radiation Oncology | Admitting: Radiation Oncology

## 2016-08-26 DIAGNOSIS — Z510 Encounter for antineoplastic radiation therapy: Secondary | ICD-10-CM | POA: Diagnosis not present

## 2016-08-29 ENCOUNTER — Ambulatory Visit
Admission: RE | Admit: 2016-08-29 | Discharge: 2016-08-29 | Disposition: A | Discharge status: Home / Self Care | Payer: Managed Care, Other (non HMO) | Source: Ambulatory Visit | Attending: Radiation Oncology | Admitting: Radiation Oncology

## 2016-08-29 DIAGNOSIS — Z510 Encounter for antineoplastic radiation therapy: Secondary | ICD-10-CM | POA: Diagnosis not present

## 2016-08-30 ENCOUNTER — Ambulatory Visit
Admission: RE | Admit: 2016-08-30 | Discharge: 2016-08-30 | Disposition: A | Discharge status: Home / Self Care | Payer: Managed Care, Other (non HMO) | Source: Ambulatory Visit | Attending: Radiation Oncology | Admitting: Radiation Oncology

## 2016-08-30 DIAGNOSIS — Z510 Encounter for antineoplastic radiation therapy: Secondary | ICD-10-CM | POA: Diagnosis not present

## 2016-08-31 ENCOUNTER — Ambulatory Visit
Admission: RE | Admit: 2016-08-31 | Discharge: 2016-08-31 | Disposition: A | Discharge status: Home / Self Care | Payer: Managed Care, Other (non HMO) | Source: Ambulatory Visit | Attending: Radiation Oncology | Admitting: Radiation Oncology

## 2016-08-31 DIAGNOSIS — Z510 Encounter for antineoplastic radiation therapy: Secondary | ICD-10-CM | POA: Diagnosis not present

## 2016-09-01 ENCOUNTER — Ambulatory Visit
Admission: RE | Admit: 2016-09-01 | Discharge: 2016-09-01 | Disposition: A | Discharge status: Home / Self Care | Payer: Managed Care, Other (non HMO) | Source: Ambulatory Visit | Attending: Radiation Oncology | Admitting: Radiation Oncology

## 2016-09-01 DIAGNOSIS — Z510 Encounter for antineoplastic radiation therapy: Secondary | ICD-10-CM | POA: Diagnosis not present

## 2016-09-02 ENCOUNTER — Ambulatory Visit
Admission: RE | Admit: 2016-09-02 | Discharge: 2016-09-02 | Disposition: A | Discharge status: Home / Self Care | Payer: Managed Care, Other (non HMO) | Source: Ambulatory Visit | Attending: Radiation Oncology | Admitting: Radiation Oncology

## 2016-09-02 ENCOUNTER — Ambulatory Visit: Payer: Managed Care, Other (non HMO)

## 2016-09-02 DIAGNOSIS — Z510 Encounter for antineoplastic radiation therapy: Secondary | ICD-10-CM | POA: Diagnosis not present

## 2016-09-05 ENCOUNTER — Encounter: Payer: Self-pay | Admitting: Radiation Oncology

## 2016-09-05 ENCOUNTER — Ambulatory Visit
Admission: RE | Admit: 2016-09-05 | Discharge: 2016-09-05 | Disposition: A | Discharge status: Home / Self Care | Payer: Managed Care, Other (non HMO) | Source: Ambulatory Visit | Attending: Radiation Oncology | Admitting: Radiation Oncology

## 2016-09-05 DIAGNOSIS — Z510 Encounter for antineoplastic radiation therapy: Secondary | ICD-10-CM | POA: Diagnosis not present

## 2016-09-05 NOTE — Progress Notes (Signed)
paperwork faxed to [facility] @ [phone], confirmation received, copy given to patient 09/01/16

## 2016-09-06 ENCOUNTER — Other Ambulatory Visit: Payer: Self-pay | Admitting: Radiation Oncology

## 2016-09-06 ENCOUNTER — Ambulatory Visit
Admission: RE | Admit: 2016-09-06 | Discharge: 2016-09-06 | Disposition: A | Discharge status: Home / Self Care | Payer: Managed Care, Other (non HMO) | Source: Ambulatory Visit | Attending: Radiation Oncology | Admitting: Radiation Oncology

## 2016-09-06 DIAGNOSIS — C61 Malignant neoplasm of prostate: Secondary | ICD-10-CM

## 2016-09-06 DIAGNOSIS — R5383 Other fatigue: Secondary | ICD-10-CM

## 2016-09-06 DIAGNOSIS — Z510 Encounter for antineoplastic radiation therapy: Secondary | ICD-10-CM | POA: Diagnosis not present

## 2016-09-06 DIAGNOSIS — F411 Generalized anxiety disorder: Secondary | ICD-10-CM

## 2016-09-06 MED ORDER — CLONAZEPAM 1 MG PO TABS: 1.0000 mg | ORAL_TABLET | Freq: Two times a day (BID) | ORAL | 0 refills | Status: DC

## 2016-09-06 NOTE — Progress Notes (Signed)
Patient presented to the clinic today requesting to be evaluated for exhaustion. Weight and vitals stable. Denies pain. Patient afebrile. Patient does not prove to be orthostatic. Patient reports inability to sleep due to anxiety. Reports anxiety consume his thoughts especially in the early morning hours. Reports exhaustion related to inability to sleep. Requesting a sleep aid. Reports it has been nine days since he had a 1/2 cc of testosterone. Reports feeling lightheaded when he bends over to pick up something off the floor but, not when he rises from a sitting to standing position. Reports a dry cough. Reports taking Mucinex daily. Reports mild dysuria. Denies hematuria. Reports constipation. Denies hematuria. Denies urinary leakage or incontinence.  BP 131/82 (BP Location: Left Arm, Patient Position: Standing, Cuff Size: Normal)   Pulse (!) 51   Temp 98.1 F (36.7 C) (Oral)   Resp 16   Wt 215 lb 6.4 oz (97.7 kg)   SpO2 100%   BMI 30.04 kg/m  Wt Readings from Last 3 Encounters:  09/06/16 215 lb 6.4 oz (97.7 kg)  08/10/16 218 lb 12.8 oz (99.2 kg)  07/18/16 211 lb 12.8 oz (96.1 kg)

## 2016-09-08 ENCOUNTER — Ambulatory Visit
Admission: RE | Admit: 2016-09-08 | Discharge: 2016-09-08 | Disposition: A | Discharge status: Home / Self Care | Payer: Managed Care, Other (non HMO) | Source: Ambulatory Visit | Attending: Radiation Oncology | Admitting: Radiation Oncology

## 2016-09-08 DIAGNOSIS — Z510 Encounter for antineoplastic radiation therapy: Secondary | ICD-10-CM | POA: Diagnosis not present

## 2016-09-09 ENCOUNTER — Ambulatory Visit
Admission: RE | Admit: 2016-09-09 | Discharge: 2016-09-09 | Disposition: A | Discharge status: Home / Self Care | Payer: Managed Care, Other (non HMO) | Source: Ambulatory Visit | Attending: Radiation Oncology | Admitting: Radiation Oncology

## 2016-09-09 ENCOUNTER — Encounter: Payer: Self-pay | Admitting: Medical Oncology

## 2016-09-09 DIAGNOSIS — Z510 Encounter for antineoplastic radiation therapy: Secondary | ICD-10-CM | POA: Diagnosis not present
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
## 2016-09-12 ENCOUNTER — Ambulatory Visit
Admission: RE | Admit: 2016-09-12 | Discharge: 2016-09-12 | Disposition: A | Discharge status: Home / Self Care | Payer: Managed Care, Other (non HMO) | Source: Ambulatory Visit | Attending: Radiation Oncology | Admitting: Radiation Oncology

## 2016-09-12 DIAGNOSIS — Z510 Encounter for antineoplastic radiation therapy: Secondary | ICD-10-CM | POA: Diagnosis not present

## 2016-09-13 ENCOUNTER — Ambulatory Visit
Admission: RE | Admit: 2016-09-13 | Discharge: 2016-09-13 | Disposition: A | Discharge status: Home / Self Care | Payer: Managed Care, Other (non HMO) | Source: Ambulatory Visit | Attending: Radiation Oncology | Admitting: Radiation Oncology

## 2016-09-13 DIAGNOSIS — Z510 Encounter for antineoplastic radiation therapy: Secondary | ICD-10-CM | POA: Diagnosis not present

## 2016-09-14 ENCOUNTER — Ambulatory Visit
Admission: RE | Admit: 2016-09-14 | Discharge: 2016-09-14 | Disposition: A | Discharge status: Home / Self Care | Payer: Managed Care, Other (non HMO) | Source: Ambulatory Visit | Attending: Radiation Oncology | Admitting: Radiation Oncology

## 2016-09-14 DIAGNOSIS — Z510 Encounter for antineoplastic radiation therapy: Secondary | ICD-10-CM | POA: Diagnosis not present

## 2016-09-15 ENCOUNTER — Ambulatory Visit: Payer: Managed Care, Other (non HMO)

## 2016-09-15 ENCOUNTER — Telehealth: Payer: Self-pay | Admitting: Radiation Oncology

## 2016-09-15 NOTE — Telephone Encounter (Signed)
Received telephone message from patient cancelling radiation therapy today due to GI upset. Informed Miranda, L4 therapist, of this finding. Requested Miranda phone patient with appointment time for tomorrow's treatment. She confirmed she would do so.

## 2016-09-16 ENCOUNTER — Ambulatory Visit: Payer: Managed Care, Other (non HMO)

## 2016-09-16 ENCOUNTER — Ambulatory Visit
Admission: RE | Admit: 2016-09-16 | Discharge: 2016-09-16 | Disposition: A | Discharge status: Home / Self Care | Payer: Managed Care, Other (non HMO) | Source: Ambulatory Visit | Attending: Radiation Oncology | Admitting: Radiation Oncology

## 2016-09-16 DIAGNOSIS — Z510 Encounter for antineoplastic radiation therapy: Secondary | ICD-10-CM | POA: Diagnosis not present

## 2016-09-19 ENCOUNTER — Ambulatory Visit: Payer: Managed Care, Other (non HMO)

## 2016-09-19 ENCOUNTER — Ambulatory Visit
Admission: RE | Admit: 2016-09-19 | Discharge: 2016-09-19 | Disposition: A | Discharge status: Home / Self Care | Payer: Managed Care, Other (non HMO) | Source: Ambulatory Visit | Attending: Radiation Oncology | Admitting: Radiation Oncology

## 2016-09-19 DIAGNOSIS — Z510 Encounter for antineoplastic radiation therapy: Secondary | ICD-10-CM | POA: Diagnosis not present

## 2016-09-20 ENCOUNTER — Ambulatory Visit: Payer: Managed Care, Other (non HMO)

## 2016-09-20 ENCOUNTER — Ambulatory Visit
Admission: RE | Admit: 2016-09-20 | Discharge: 2016-09-20 | Disposition: A | Discharge status: Home / Self Care | Payer: Managed Care, Other (non HMO) | Source: Ambulatory Visit | Attending: Radiation Oncology | Admitting: Radiation Oncology

## 2016-09-20 DIAGNOSIS — C61 Malignant neoplasm of prostate: Secondary | ICD-10-CM | POA: Insufficient documentation

## 2016-09-21 ENCOUNTER — Ambulatory Visit: Payer: Managed Care, Other (non HMO)

## 2016-09-21 ENCOUNTER — Ambulatory Visit
Admission: RE | Admit: 2016-09-21 | Discharge: 2016-09-21 | Disposition: A | Discharge status: Home / Self Care | Payer: Managed Care, Other (non HMO) | Source: Ambulatory Visit | Attending: Radiation Oncology | Admitting: Radiation Oncology

## 2016-09-21 ENCOUNTER — Encounter: Payer: Self-pay | Admitting: Radiation Oncology

## 2016-09-21 DIAGNOSIS — C61 Malignant neoplasm of prostate: Secondary | ICD-10-CM | POA: Diagnosis not present

## 2016-09-21 NOTE — Progress Notes (Signed)
  Radiation Oncology         [phone] ________________________________  Name: Jacob Collier MRN: [id number]  Date: 09/21/2016  DOB: 07/16/1955  End of Treatment Note  Diagnosis:   61 y.o. gentleman with a biochemical recurrence of adenocarcinoma of the prostate (pT3a, pN0) Gleason Score of 4+3 and pre-operative PSA of 5.69 and post-op detectable PSA as high as 0.3     Indication for treatment:  Curative, Prostatic Fossa Radiotherapy       Radiation treatment dates:   07/25/2016 to 09/21/2016  Site/dose:   The prostatic fossa was treated to 68.4 Gy in 38 fractions of 1.8 Gy  Beams/energy:   The prostatic fossa was treated using helical intensity modulated radiotherapy delivering 6 megavolt photons. Image guidance was performed with megavoltage CT studies prior to each fraction. He was immobilized with a body fix lower extremity mold.  Narrative: The patient tolerated radiation treatment relatively well.  Patient reports moderate fatigue and nocturia x 3-4. He denies pain, urgency, hematuria, or diarrhea.   Plan: The patient has completed radiation treatment. He will return to radiation oncology clinic for routine followup in one month. I advised him to call or return sooner if he has any questions or concerns related to his recovery or treatment. ________________________________  Sheral Apley. Tammi Klippel, M.D.  This document serves as a record of services personally performed by Tyler Pita, MD. It was created on his behalf by Arlyce Harman, a trained medical scribe. The creation of this record is based on the scribe's personal observations and the provider's statements to them. This document has been checked and approved by the attending provider.

## 2016-09-23 ENCOUNTER — Encounter: Payer: Self-pay | Admitting: Radiation Oncology

## 2016-10-17 ENCOUNTER — Telehealth: Payer: Self-pay | Admitting: Radiation Oncology

## 2016-10-17 NOTE — Telephone Encounter (Signed)
Pt was wondering if we rec'd his message to fill out his return-to-work paperwork. [name], RN has rec'd his msg and paperwork is being worked on at this moment. Pt wanted it by Wednesday morning to return to work by 2nd shift Wednesday.

## 2016-10-18 ENCOUNTER — Encounter: Payer: Self-pay | Admitting: Radiation Oncology

## 2016-10-18 NOTE — Progress Notes (Signed)
Patient requested note to return to work on Wednesday, August 15. No prepared and in Ashlyn Brunings, PA-C inbox for signature.

## 2016-10-19 ENCOUNTER — Encounter: Payer: Self-pay | Admitting: Radiation Oncology

## 2016-10-27 ENCOUNTER — Ambulatory Visit
Admission: RE | Admit: 2016-10-27 | Discharge: 2016-10-27 | Disposition: A | Discharge status: Home / Self Care | Payer: Managed Care, Other (non HMO) | Source: Ambulatory Visit | Attending: Urology | Admitting: Urology

## 2016-10-27 ENCOUNTER — Encounter: Payer: Self-pay | Admitting: Urology

## 2016-10-27 VITALS — BP 126/72 | HR 54 | Resp 20 | Wt 221.0 lb

## 2016-10-27 DIAGNOSIS — C61 Malignant neoplasm of prostate: Secondary | ICD-10-CM | POA: Insufficient documentation

## 2016-10-27 NOTE — Progress Notes (Signed)
Radiation Oncology         [phone] ________________________________  Name: Jacob Collier MRN: [id number]  Date: 10/27/2016  DOB: [birth year]  Post Treatment Note  CC: [name], MD  Kathie Rhodes, MD  Diagnosis:  60 y.o. gentleman with a biochemical recurrence of adenocarcinoma of the prostate (pT3a, pN0) Gleason Score of 4+3 and pre-operative PSA of 5.69 and post-op detectable PSA as high as 0.3    Interval Since Last Radiation:  5 weeks  07/25/2016 to 09/21/2016:  The prostatic fossa was treated to 68.4 Gy in 38 fractions of 1.8 Gy   Narrative:  The patient returns today for routine follow-up.  He tolerated radiation treatment relatively well.  He experienced moderate fatigue and nocturia x 3-4. He denied pain, urgency, hematuria, or diarrhea.                             On review of systems, the patient states that he is doing well in general.  He denies gross hematuria, dysuria, increased frequency, urgency or incontinence.  He continues with significant decrease in energy but attributes this to the fact that he has remained off his testosterone for the past 3 weeks. He reports self administering testosterone injection once prior to his radiotherapy and once about midway through treatment.  He denies abdominal pain, N/V or diarrhea.  He reports a healthy appetite and is maintaining his weight.  He reports that he feels awful when he is off testosterone with decreased heart rate, severe fatigue and decreased libido.He intends to continue on low-dose testosterone and has recently met with a local physician that specializes in HRT and has recommended the use of sublingual testosterone supplementation.  ALLERGIES:  is allergic to no known allergies.  Meds: Current Outpatient Prescriptions  Medication Sig Dispense Refill  . acetaminophen (TYLENOL) 500 MG tablet Take 1,000 mg by mouth every 6 (six) hours as needed for moderate pain.     [name] acetaminophen-codeine (TYLENOL #3)  300-30 MG tablet Take 1 tablet by mouth daily as needed for moderate pain.     . Eszopiclone 3 MG TABS Take 1 tablet by mouth at bedtime as needed.  4  . LYRICA 50 MG capsule Take 50 mg by mouth 2 (two) times daily.  5  . Omega-3 Fatty Acids (FISH OIL) 1000 MG CAPS Take 2,000 mg by mouth daily.     . clonazePAM (KLONOPIN) 1 MG tablet Take 1 tablet (1 mg total) by mouth 2 (two) times daily. (Patient not taking: Reported on 10/27/2016) 45 tablet 0  . indomethacin (INDOCIN) 50 MG capsule Take 50-100 mg by mouth daily as needed.     No current facility-administered medications for this encounter.     Physical Findings:  weight is 221 lb (100.2 kg). His blood pressure is 126/72 and his pulse is 54 (abnormal). His respiration is 20 and oxygen saturation is 97%.  Pain Assessment Pain Score: 4  Pain Loc: Leg/10 In general this is a well appearing Caucasian male in no acute distress. He's alert and oriented x4 and appropriate throughout the examination. Cardiopulmonary assessment is negative for acute distress and he( exhibits normal effort.   Lab Findings: Lab Results  Component Value Date   WBC 7.4 11/11/2015   HGB 11.6 (L) 11/11/2015   HCT 37.1 (L) 11/11/2015   MCV 92.3 11/11/2015   PLT 150 11/11/2015     Radiographic Findings: No results found.  Impression/Plan: 1. 60  y.o. gentleman with a biochemical recurrence of adenocarcinoma of the prostate (pT3a, pN0) Gleason Score of 4+3 with pre-operative PSA of 5.69 and post-op detectable PSA as high as 0.3    He will continue to follow up with urology for ongoing PSA determinations and has an upcoming appointment scheduled with Dr. Karsten Ro. We will repeat a PSA and testosterone level today and plan to call him with these results.  He admits that he is planning to use a small dose of testosterone later today and would like to be able to compare PSA levels before testosterone and after. He understands the risk related with continued use of  testosterone in patients with diagnosis of prostate cancer and is willing to except those risks in order to maintain a decent quality of life. I have encouraged him to have further discussion regarding testosterone supplementation with his urologist, Dr. Karsten Ro. He understands what to expect with regards to PSA monitoring going forward. I will look forward to following his response to treatment via correspondence with urology, and would be happy to continue to participate in his care if clinically indicated. I talked to the patient about what to expect in the future, including his risk for erectile dysfunction and rectal bleeding. I encouraged him to call or return to the office if he has any questions regarding his previous radiation or possible radiation side effects. He was comfortable with this plan and will follow up as needed.    Nicholos Johns, PA-C

## 2016-10-28 ENCOUNTER — Telehealth: Payer: Self-pay | Admitting: Urology

## 2016-10-28 LAB — TESTOSTERONE: Testosterone, Serum: 163 ng/dL — ABNORMAL LOW (ref 264–916)

## 2016-10-28 LAB — PSA: Prostate Specific Ag, Serum: <0.1 ng/mL (ref 0.0–4.0)

## 2016-10-28 NOTE — Telephone Encounter (Signed)
I called and LVMOM regarding his recent Testosterone and PSA results.  Testosterone is quite low at 163 and PSA is undetectable at this time.  I will forward these results to Dr. Karsten Ro for his records as well.  Patient has an upcoming follow up appointment with Dr. Karsten Ro in October.  I advised Jacob Collier to call with any questions or concerns related to this information and/or his previous radiotherapy.  I look forward to following his progress via correspondence with Dr. Karsten Ro.   Nicholos Johns, PA-C

## 2016-12-20 NOTE — Addendum Note (Signed)
Encounter addended by: Heywood Footman, RN on: 12/20/2016 11:10 AM<BR>    Actions taken: Visit Navigator Flowsheet section accepted

## 2016-12-29 ENCOUNTER — Encounter: Payer: Self-pay | Admitting: Internal Medicine

## 2017-01-16 DIAGNOSIS — R001 Bradycardia, unspecified: Secondary | ICD-10-CM | POA: Insufficient documentation

## 2017-01-16 DIAGNOSIS — E785 Hyperlipidemia, unspecified: Secondary | ICD-10-CM | POA: Insufficient documentation

## 2017-01-16 DIAGNOSIS — R29898 Other symptoms and signs involving the musculoskeletal system: Secondary | ICD-10-CM | POA: Insufficient documentation

## 2017-02-01 ENCOUNTER — Other Ambulatory Visit: Payer: Self-pay

## 2017-02-01 ENCOUNTER — Encounter: Payer: Self-pay | Admitting: *Deleted

## 2017-02-01 ENCOUNTER — Telehealth: Payer: Self-pay | Admitting: *Deleted

## 2017-02-01 ENCOUNTER — Encounter: Payer: Self-pay | Admitting: Internal Medicine

## 2017-02-01 ENCOUNTER — Ambulatory Visit (AMBULATORY_SURGERY_CENTER): Payer: Self-pay | Admitting: *Deleted

## 2017-02-01 VITALS — Ht 71.0 in | Wt 220.0 lb

## 2017-02-01 DIAGNOSIS — Z1211 Encounter for screening for malignant neoplasm of colon: Secondary | ICD-10-CM

## 2017-02-01 MED ORDER — NA SULFATE-K SULFATE-MG SULF 17.5-3.13-1.6 GM/177ML PO SOLN: 1.0000 | Freq: Once | ORAL | 0 refills | Status: AC

## 2017-02-01 NOTE — Telephone Encounter (Signed)
Okay to proceed with colonoscopy We can have office follow-up thereafter to discuss symptoms if they persist

## 2017-02-01 NOTE — Progress Notes (Signed)
No egg or soy allergy known to patient  No issues with past sedation with any surgeries  or procedures, no intubation problems  No diet pills per patient No home 02 use per patient  No blood thinners per patient  issues with constipation -- pt states he has been having issues with stools- was a daily person, now goes once every 4 days or so, no blood- when he does go , stools are hard- if he takes the medicine Dr Henrene Pastor gave him, he has loose stools- he has left abdominal discomfort- his PCP has told him he thinks he has adhesions of large intestine to the bladder after his prostate surgery- he can eat and feel like it sits in stomach- uncomfortable - has been told mya have diverticulitis- was treated with an antibiotic for this  No A fib or A flutter  EMMI video sent to pt's e mail

## 2017-02-01 NOTE — Telephone Encounter (Signed)
Dr Hilarie Fredrickson,  I saw this gentleman in [place] today- he has no GI history, has never been seen in the office- issues with constipation -- pt states he has been having issues with stools- was a daily person, now goes once every 4 days or so, no blood- when he does go , stools are hard- if he takes the medicine Dr Henrene Pastor gave him ( unsure of med name ) , he has loose stools- he has left abdominal discomfort- his PCP has told him he thinks he has adhesions of large intestine to the bladder after his prostate surgery- he can eat and feel like it sits in stomach- uncomfortable - has been told may have diverticulitis- was treated with an antibiotic for this - he is a direct for a screening- Is this okay with you to proceed without an OV?  Please advise- thanks   Lelan Pons PV

## 2017-02-15 ENCOUNTER — Encounter: Payer: Managed Care, Other (non HMO) | Admitting: Internal Medicine

## 2017-03-01 ENCOUNTER — Telehealth: Payer: Self-pay | Admitting: Internal Medicine

## 2017-03-01 NOTE — Telephone Encounter (Signed)
Patient resch afternoon colon from 12.12.18 to morning on 1.21.19 and has further questions regarding procedure. Pt requesting to speak with some one about questions.

## 2017-03-02 ENCOUNTER — Encounter: Payer: Self-pay | Admitting: *Deleted

## 2017-03-02 NOTE — Telephone Encounter (Signed)
Lm to return call-  Printed new instructions with 1-21 date and mailed to pt. Lelan Pons PV

## 2017-03-02 NOTE — Telephone Encounter (Signed)
Called pt- No answer, left message

## 2017-03-27 ENCOUNTER — Encounter: Payer: Self-pay | Admitting: Internal Medicine

## 2017-03-27 ENCOUNTER — Ambulatory Visit (AMBULATORY_SURGERY_CENTER): Payer: Managed Care, Other (non HMO) | Admitting: Internal Medicine

## 2017-03-27 ENCOUNTER — Other Ambulatory Visit: Payer: Self-pay

## 2017-03-27 VITALS — BP 138/68 | HR 70 | Temp 96.6°F | Resp 16 | Ht 71.0 in | Wt 220.0 lb

## 2017-03-27 DIAGNOSIS — Z1211 Encounter for screening for malignant neoplasm of colon: Secondary | ICD-10-CM

## 2017-03-27 DIAGNOSIS — K635 Polyp of colon: Secondary | ICD-10-CM

## 2017-03-27 DIAGNOSIS — D122 Benign neoplasm of ascending colon: Secondary | ICD-10-CM | POA: Diagnosis not present

## 2017-03-27 DIAGNOSIS — D123 Benign neoplasm of transverse colon: Secondary | ICD-10-CM | POA: Diagnosis not present

## 2017-03-27 DIAGNOSIS — D125 Benign neoplasm of sigmoid colon: Secondary | ICD-10-CM

## 2017-03-27 DIAGNOSIS — K5909 Other constipation: Secondary | ICD-10-CM

## 2017-03-27 MED ORDER — SODIUM CHLORIDE 0.9 % IV SOLN: 500.0000 mL | Freq: Once | INTRAVENOUS | Status: DC

## 2017-03-27 MED ORDER — LINACLOTIDE 72 MCG PO CAPS
72.0000 ug | ORAL_CAPSULE | Freq: Every day | ORAL | 5 refills | Status: DC
Start: 2017-03-27 — End: 2018-04-02

## 2017-03-27 NOTE — Progress Notes (Signed)
Called to room to assist during endoscopic procedure.  Patient ID and intended procedure confirmed with present staff. Received instructions for my participation in the procedure from the performing physician.  

## 2017-03-27 NOTE — Op Note (Signed)
[place] Patient Name: Jacob Collier Procedure Date: 03/27/2017 8:40 AM MRN: [id number] Endoscopist: Jerene Bears , MD Age: 62 Referring MD:  Date of Birth: 1955-06-30 Gender: Male Account #: [id number] Procedure:                Colonoscopy Indications:              Screening for colorectal malignant neoplasm, This                            is the patient's first colonoscopy, Incidental                            constipation noted Medicines:                Monitored Anesthesia Care Procedure:                Pre-Anesthesia Assessment:                           - Prior to the procedure, a History and Physical                            was performed, and patient medications and                            allergies were reviewed. The patient's tolerance of                            previous anesthesia was also reviewed. The risks                            and benefits of the procedure and the sedation                            options and risks were discussed with the patient.                            All questions were answered, and informed consent                            was obtained. Prior Anticoagulants: The patient has                            taken no previous anticoagulant or antiplatelet                            agents. ASA Grade Assessment: II - A patient with                            mild systemic disease. After reviewing the risks                            and benefits, the patient was deemed in  satisfactory condition to undergo the procedure.                           After obtaining informed consent, the colonoscope                            was passed under direct vision. Throughout the                            procedure, the patient's blood pressure, pulse, and                            oxygen saturations were monitored continuously. The                            Colonoscope was introduced through the anus and                             advanced to the the cecum, identified by                            appendiceal orifice and ileocecal valve. The                            patient tolerated the procedure well. The quality                            of the bowel preparation was good. The ileocecal                            valve, appendiceal orifice, and rectum were                            photographed. The colonoscopy was somewhat                            difficult due to multiple diverticula in the colon. Scope In: 8:53:29 AM Scope Out: 9:25:44 AM Scope Withdrawal Time: 0 hours 20 minutes 38 seconds  Total Procedure Duration: 0 hours 32 minutes 15 seconds  Findings:                 The digital rectal exam was normal.                           A 10 mm polyp with mucus cap was found in the                            ascending colon. The polyp was sessile. The polyp                            was removed with a cold snare. Resection and                            retrieval were complete.  A 4 mm polyp was found in the ascending colon. The                            polyp was sessile. The polyp was removed with a                            cold snare. Resection and retrieval were complete.                           A 5 mm polyp was found in the transverse colon. The                            polyp was sessile. The polyp was removed with a                            cold snare. Resection and retrieval were complete.                           A 4 mm polyp was found in the distal sigmoid colon.                            The polyp was sessile. The polyp was removed with a                            cold snare. Resection and retrieval were complete.                           Multiple small and large-mouthed diverticula were                            found in the proximal sigmoid colon, mid sigmoid                            colon and distal descending colon. There was                             narrowing of the colon in association with the                            diverticular opening.                           Internal hemorrhoids were found during                            retroflexion. The hemorrhoids were small. Complications:            No immediate complications. Estimated Blood Loss:     Estimated blood loss was minimal. Impression:               - One 10 mm polyp in the ascending colon, removed  with a cold snare. Resected and retrieved.                           - One 4 mm polyp in the ascending colon, removed                            with a cold snare. Resected and retrieved.                           - One 5 mm polyp in the transverse colon, removed                            with a cold snare. Resected and retrieved.                           - One 4 mm polyp in the distal sigmoid colon,                            removed with a cold snare. Resected and retrieved.                           - Severe diverticulosis in the proximal sigmoid                            colon, in the mid sigmoid colon and in the distal                            descending colon. There was narrowing and                            angulation of the colon in association with the                            diverticular disease in this segment.                           - Internal hemorrhoids. Recommendation:           - Patient has a contact number available for                            emergencies. The signs and symptoms of potential                            delayed complications were discussed with the                            patient. Return to normal activities tomorrow.                            Written discharge instructions were provided to the                            patient.                           -  Resume previous diet.                           - Continue present medications.                           - Await  pathology results.                           - Can continue laxatives for constipation. However,                            sigmoidectomy is an option for ongoing symptomatic                            diverticulosis (constipation, abdominal bloating,                            abdominal pain) and certainly if recurrent                            diverticulitis.                           - Repeat colonoscopy is recommended. The                            colonoscopy date will be determined after pathology                            results from today's exam become available for                            review. Jerene Bears, MD 03/27/2017 9:32:58 AM This report has been signed electronically.

## 2017-03-27 NOTE — Progress Notes (Signed)
Report given to PACU, vss 

## 2017-03-27 NOTE — Patient Instructions (Signed)
*  Handouts given on polyps, diverticulosis, and hemorrhoids Pamphlet also given on banding procedure performed on 3rd floor for internal hemorrhoids only   YOU HAD AN ENDOSCOPIC PROCEDURE TODAY AT [place]:   Refer to the procedure report that was given to you for any specific questions about what was found during the examination.  If the procedure report does not answer your questions, please call your gastroenterologist to clarify.  If you requested that your care partner not be given the details of your procedure findings, then the procedure report has been included in a sealed envelope for you to review at your convenience later.  YOU SHOULD EXPECT: Some feelings of bloating in the abdomen. Passage of more gas than usual.  Walking can help get rid of the air that was put into your GI tract during the procedure and reduce the bloating. If you had a lower endoscopy (such as a colonoscopy or flexible sigmoidoscopy) you may notice spotting of blood in your stool or on the toilet paper. If you underwent a bowel prep for your procedure, you may not have a normal bowel movement for a few days.  Please Note:  You might notice some irritation and congestion in your nose or some drainage.  This is from the oxygen used during your procedure.  There is no need for concern and it should clear up in a day or so.  SYMPTOMS TO REPORT IMMEDIATELY:   Following lower endoscopy (colonoscopy or flexible sigmoidoscopy):  Excessive amounts of blood in the stool  Significant tenderness or worsening of abdominal pains  Swelling of the abdomen that is new, acute  Fever of 100F or higher   For urgent or emergent issues, a gastroenterologist can be reached at any hour by calling [phone].   DIET:  We do recommend a small meal at first, but then you may proceed to your regular diet.  Drink plenty of fluids but you should avoid alcoholic beverages for 24 hours.  ACTIVITY:  You should plan to  take it easy for the rest of today and you should NOT DRIVE or use heavy machinery until tomorrow (because of the sedation medicines used during the test).    FOLLOW UP: Our staff will call the number listed on your records the next business day following your procedure to check on you and address any questions or concerns that you may have regarding the information given to you following your procedure. If we do not reach you, we will leave a message.  However, if you are feeling well and you are not experiencing any problems, there is no need to return our call.  We will assume that you have returned to your regular daily activities without incident.  If any biopsies were taken you will be contacted by phone or by letter within the next 1-3 weeks.  Please call us at [phone] if you have not heard about the biopsies in 3 weeks.    SIGNATURES/CONFIDENTIALITY: You and/or your care partner have signed paperwork which will be entered into your electronic medical record.  These signatures attest to the fact that that the information above on your After Visit Summary has been reviewed and is understood.  Full responsibility of the confidentiality of this discharge information lies with you and/or your care-partner.

## 2017-03-27 NOTE — Progress Notes (Signed)
No allergy to soy or eggs.  Pt states he had bacterial pneumonia in November, tx with antibiotics and resolved.  States he's fine now.

## 2017-03-28 ENCOUNTER — Telehealth: Payer: Self-pay

## 2017-03-28 ENCOUNTER — Telehealth: Payer: Self-pay | Admitting: *Deleted

## 2017-03-28 NOTE — Telephone Encounter (Signed)
No answer, left message to call if questions or concerns.  Second call.

## 2017-03-28 NOTE — Telephone Encounter (Signed)
Left message

## 2017-03-31 ENCOUNTER — Encounter: Payer: Self-pay | Admitting: Internal Medicine

## 2017-04-12 ENCOUNTER — Telehealth: Payer: Self-pay

## 2017-04-12 NOTE — Telephone Encounter (Signed)
Pt reports he was supposed to be set up to see a Psychologist, sport and exercise. On colon report it states sigmoidectomy is an option for ongoing symptomatic diverticulosis and recurrent diverticulitis. Have not received a call from patient stating he is still having issues. Left message for pt letting him know that we had not heard from him regarding wanting a surgical referral. Request pt call back and let us know if he wants to proceed with referral.

## 2017-04-14 ENCOUNTER — Telehealth: Payer: Self-pay | Admitting: Internal Medicine

## 2017-04-14 NOTE — Telephone Encounter (Signed)
See additional note. 

## 2017-04-14 NOTE — Telephone Encounter (Signed)
Please refer to Dr. Dema Severin, Harriette Ohara, or Gross for consideration of sigmoidectomy for symptomatic diverticular disease

## 2017-04-14 NOTE — Telephone Encounter (Signed)
Please advise, see note below. 

## 2017-04-17 NOTE — Telephone Encounter (Signed)
Referral faxed to [facility] and pt knows to expect a call from them regarding an appt.

## 2017-04-20 ENCOUNTER — Ambulatory Visit: Payer: Managed Care, Other (non HMO) | Admitting: Nurse Practitioner

## 2017-04-25 ENCOUNTER — Telehealth: Payer: Self-pay

## 2017-04-25 NOTE — Telephone Encounter (Signed)
Pt scheduled to see Dr. Marcello Moores with [facility] 05/09/17@9 :10am, pt to arrive there at 8:40am.

## 2017-05-09 ENCOUNTER — Other Ambulatory Visit: Payer: Self-pay | Admitting: General Surgery

## 2017-05-09 DIAGNOSIS — K56699 Other intestinal obstruction unspecified as to partial versus complete obstruction: Secondary | ICD-10-CM

## 2017-05-11 ENCOUNTER — Ambulatory Visit
Admission: RE | Admit: 2017-05-11 | Discharge: 2017-05-11 | Disposition: A | Discharge status: Home / Self Care | Payer: Managed Care, Other (non HMO) | Source: Ambulatory Visit | Attending: General Surgery | Admitting: General Surgery

## 2017-05-11 DIAGNOSIS — K56699 Other intestinal obstruction unspecified as to partial versus complete obstruction: Secondary | ICD-10-CM

## 2017-05-11 MED ORDER — IOPAMIDOL (ISOVUE-300) INJECTION 61%
125.0000 mL | Freq: Once | INTRAVENOUS | Status: AC | PRN
  Administered 2017-05-11: 125 mL via INTRAVENOUS

## 2017-05-16 ENCOUNTER — Ambulatory Visit: Payer: Self-pay | Admitting: General Surgery

## 2017-05-16 MED ORDER — BUPIVACAINE LIPOSOME 1.3 % IJ SUSP: 20.0000 mL | INTRAMUSCULAR | Status: AC

## 2017-05-16 NOTE — H&P (Signed)
History of Present Illness Leighton Ruff MD; 03/14/8414 9:40 AM) The patient is a 62 year old male who presents with non-malignant abdominal pain. 62 year old male who presents to the office with a diverticular stricture noted on colonoscopy. He states that he has had intermittent abdominal pain for several years. He has had several bouts of diverticulitis over the last 13 years. He continues to have pain unless he is taking and all liquid diet. The pain is located in his left side and is crampy in nature. It resolves when he stops eating. The pain radiates to his retroperitoneum. He has a history of an open prostatectomy in 2015. He recently underwent a colonoscopy which showed multiple polyps and a diverticular stricture.   Past Surgical History (Tanisha A. Owens Shark, [place]; 05/09/2017 9:25 AM) Hip Surgery  Left. Prostate Surgery - Removal   Allergies (Tanisha A. Owens Shark, [place]; 05/09/2017 9:26 AM) No Known Drug Allergies [05/09/2017]: Allergies Reconciled   Medication History (Tanisha A. Owens Shark, RMA; 05/09/2017 9:27 AM) Eszopiclone (3MG  Tablet, Oral) Active. Tylenol (325MG  Tablet, Oral) Active. Medications Reconciled  Social History (Tanisha A. Owens Shark, RMA; 05/09/2017 9:25 AM) Alcohol use  Moderate alcohol use. Caffeine use  Carbonated beverages. No drug use  Tobacco use  Never smoker.  Family History (Tanisha A. Owens Shark, [place]; 05/09/2017 9:25 AM) Alcohol Abuse  Father. Arthritis  Mother. Diabetes Mellitus  Brother. Heart disease in male family member before age 65  Hypertension  Brother.  Other Problems (Tanisha A. Owens Shark, [place]; 05/09/2017 9:25 AM) Anxiety Disorder  Arthritis  Depression  Diverticulosis  Heart murmur  Prostate Cancer     Review of Systems (Tanisha A. Brown RMA; 05/09/2017 9:25 AM) General Present- Fatigue. Not Present- Appetite Loss, Chills, Fever, Night Sweats, Weight Gain and Weight Loss. Skin Not Present- Change in Wart/Mole, Dryness, Hives,  Jaundice, New Lesions, Non-Healing Wounds, Rash and Ulcer. HEENT Not Present- Earache, Hearing Loss, Hoarseness, Nose Bleed, Oral Ulcers, Ringing in the Ears, Seasonal Allergies, Sinus Pain, Sore Throat, Visual Disturbances, Wears glasses/contact lenses and Yellow Eyes. Respiratory Not Present- Bloody sputum, Chronic Cough, Difficulty Breathing, Snoring and Wheezing. Breast Not Present- Breast Mass, Breast Pain, Nipple Discharge and Skin Changes. Cardiovascular Not Present- Chest Pain, Difficulty Breathing Lying Down, Leg Cramps, Palpitations, Rapid Heart Rate, Shortness of Breath and Swelling of Extremities. Gastrointestinal Present- Abdominal Pain, Bloating, Change in Bowel Habits, Constipation and Hemorrhoids. Not Present- Bloody Stool, Chronic diarrhea, Difficulty Swallowing, Excessive gas, Gets full quickly at meals, Indigestion, Nausea, Rectal Pain and Vomiting. Male Genitourinary Present- Impotence. Not Present- Blood in Urine, Change in Urinary Stream, Frequency, Nocturia, Painful Urination, Urgency and Urine Leakage.  Vitals (Tanisha A. Brown RMA; 05/09/2017 9:26 AM) 05/09/2017 9:26 AM Weight: 217 lb Height: 71in Body Surface Area: 2.18 m Body Mass Index: 30.27 kg/m  Temp.: 98.5F  Pulse: 67 (Regular)  BP: 128/76 (Sitting, Left Arm, Standard)       Physical Exam Leighton Ruff MD; 6/0/6301 10:07 AM) General Mental Status-Alert. General Appearance-Not in acute distress. Build & Nutrition-Well nourished. Posture-Normal posture. Gait-Normal.  Head and Neck Head-normocephalic, atraumatic with no lesions or palpable masses. Trachea-midline.  Chest and Lung Exam Chest and lung exam reveals -on auscultation, normal breath sounds, no adventitious sounds and normal vocal resonance.  Cardiovascular Cardiovascular examination reveals -normal heart sounds, regular rate and rhythm with no murmurs and no digital clubbing, cyanosis, edema, increased warmth or  tenderness.  Abdomen Inspection Inspection of the abdomen reveals - No Hernias. Palpation/Percussion Palpation and Percussion of the abdomen reveal -  Soft, Non Tender, No Rigidity (guarding), No hepatosplenomegaly and No Palpable abdominal masses.  Neurologic Neurologic evaluation reveals -alert and oriented x 3 with no impairment of recent or remote memory, normal attention span and ability to concentrate, normal sensation and normal coordination.  Musculoskeletal Normal Exam - Bilateral-Upper Extremity Strength Normal and Lower Extremity Strength Normal.    Assessment & Plan Leighton Ruff MD; 06/10/386 10:08 AM) Cathie Olden STRICTURE (E28.003) Impression: 62 year old male who presents to the office with chronic left-sided abdominal pain. He recently underwent a colonoscopy which showed a nonmalignant stricture. This was felt to be the source of his pain. He has tried stool softeners and a liquid diet which do help somewhat in his pain. I would like to get a CT scan to evaluate this further. We will then plan on performing a minimally invasive partial colectomy as it appears that his sigmoid stricture is the cause of his abdominal pain. The surgery and anatomy were described to the patient as well as the risks of surgery and the possible complications. These include: Bleeding, deep abdominal infections and possible wound complications such as hernia and infection, damage to adjacent structures, leak of surgical connections, which can lead to other surgeries and possibly an ostomy, possible need for other procedures, such as abscess drains in radiology, possible prolonged hospital stay, possible diarrhea from removal of part of the colon, possible constipation from narcotics, possible bowel, bladder or sexual dysfunction if having rectal surgery, prolonged fatigue/weakness or appetite loss, possible early recurrence of of disease, possible complications of their medical problems such as heart  disease or arrhythmias or lung problems, death (less than 1%). I believe the patient understands and wishes to proceed with the surgery. Current Plans

## 2017-06-12 ENCOUNTER — Encounter: Payer: Self-pay | Admitting: *Deleted

## 2017-06-22 ENCOUNTER — Ambulatory Visit: Payer: Managed Care, Other (non HMO) | Admitting: Internal Medicine

## 2018-01-03 ENCOUNTER — Ambulatory Visit (INDEPENDENT_AMBULATORY_CARE_PROVIDER_SITE_OTHER): Payer: Managed Care, Other (non HMO) | Admitting: Nurse Practitioner

## 2018-01-03 ENCOUNTER — Encounter: Payer: Self-pay | Admitting: Nurse Practitioner

## 2018-01-03 VITALS — BP 146/80 | HR 60 | Ht 71.0 in | Wt 214.0 lb

## 2018-01-03 DIAGNOSIS — K5790 Diverticulosis of intestine, part unspecified, without perforation or abscess without bleeding: Secondary | ICD-10-CM | POA: Diagnosis not present

## 2018-01-03 MED ORDER — NA SULFATE-K SULFATE-MG SULF 17.5-3.13-1.6 GM/177ML PO SOLN: ORAL | 0 refills | Status: DC
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
## 2018-01-03 NOTE — Patient Instructions (Addendum)
If you are age 63 or older, your body mass index should be between 23-30. Your Body mass index is 29.85 kg/m. If this is out of the aforementioned range listed, please consider follow up with your Primary Care Provider.  If you are age 66 or younger, your body mass index should be between 19-25. Your Body mass index is 29.85 kg/m. If this is out of the aformentioned range listed, please consider follow up with your Primary Care Provider.   You have been scheduled for a colonoscopy. Please follow written instructions given to you at your visit today.  Please pick up your prep supplies at the pharmacy within the next 1-3 days. If you use inhalers (even only as needed), please bring them with you on the day of your procedure. Your physician has requested that you go to www.startemmi.com and enter the access code given to you at your visit today. This web site gives a general overview about your procedure. However, you should still follow specific instructions given to you by our office regarding your preparation for the procedure.  We have sent the following medications to your pharmacy for you to pick up at your convenience: Suprep  Thank you for choosing me and [facility].   Tye Savoy, NP

## 2018-01-04 ENCOUNTER — Telehealth: Payer: Self-pay

## 2018-01-04 NOTE — Telephone Encounter (Signed)
Left a message to advise of this information. Colonoscopy is 01/22/18.

## 2018-01-04 NOTE — Telephone Encounter (Signed)
-----   Message from Willia Craze, NP sent at 01/03/2018  4:10 PM EDT ----- Eustaquio Maize, please let patient know that Dr. Hilarie Fredrickson spoke to his surgeon at [facility].  Surgeon would like a complete colonoscopy so we will proceed with it.  He is already scheduled. Thanks

## 2018-01-22 ENCOUNTER — Ambulatory Visit (AMBULATORY_SURGERY_CENTER): Payer: Managed Care, Other (non HMO) | Admitting: Internal Medicine

## 2018-01-22 ENCOUNTER — Encounter: Payer: Self-pay | Admitting: Internal Medicine

## 2018-01-22 VITALS — BP 119/71 | HR 61 | Temp 99.1°F | Resp 21

## 2018-01-22 DIAGNOSIS — K648 Other hemorrhoids: Secondary | ICD-10-CM

## 2018-01-22 DIAGNOSIS — D123 Benign neoplasm of transverse colon: Secondary | ICD-10-CM

## 2018-01-22 DIAGNOSIS — K5790 Diverticulosis of intestine, part unspecified, without perforation or abscess without bleeding: Secondary | ICD-10-CM | POA: Diagnosis not present

## 2018-01-22 MED ORDER — SODIUM CHLORIDE 0.9 % IV SOLN: 500.0000 mL | Freq: Once | INTRAVENOUS | Status: DC

## 2018-01-22 NOTE — Op Note (Signed)
[facility] Patient Name: Jacob Collier Procedure Date: 01/22/2018 1:29 PM MRN: [id number] Endoscopist: Jerene Bears , MD Age: 62 Referring MD:  Date of Birth: 1955-08-23 Gender: Male Account #: [id number] Procedure:                Colonoscopy Indications:              Diverticulosis of the colon, symptomatic                            diverticular disease, personal history of colonic                            polyps, last colonoscopy Jan 2019 Medicines:                Monitored Anesthesia Care Procedure:                Pre-Anesthesia Assessment:                           - Prior to the procedure, a History and Physical                            was performed, and patient medications and                            allergies were reviewed. The patient's tolerance of                            previous anesthesia was also reviewed. The risks                            and benefits of the procedure and the sedation                            options and risks were discussed with the patient.                            All questions were answered, and informed consent                            was obtained. Prior Anticoagulants: The patient has                            taken no previous anticoagulant or antiplatelet                            agents. ASA Grade Assessment: II - A patient with                            mild systemic disease. After reviewing the risks                            and benefits, the patient was deemed in  satisfactory condition to undergo the procedure.                           After obtaining informed consent, the colonoscope                            was passed under direct vision. Throughout the                            procedure, the patient's blood pressure, pulse, and                            oxygen saturations were monitored continuously. The                            Model PCF-H190DL [id number]) scope  was introduced                            through the anus and advanced to the cecum,                            identified by appendiceal orifice and ileocecal                            valve. The patient tolerated the procedure well.                            The quality of the bowel preparation was good. The                            ileocecal valve, appendiceal orifice, and rectum                            were photographed. The colonoscopy was technically                            difficult and complex due to multiple diverticula                            in the colon and restricted mobility of the colon. Scope In: 1:38:49 PM Scope Out: 2:02:31 PM Scope Withdrawal Time: 0 hours 10 minutes 51 seconds  Total Procedure Duration: 0 hours 23 minutes 42 seconds  Findings:                 A 4 mm polyp was found in the hepatic flexure. The                            polyp was sessile. The polyp was removed with a                            cold snare. Resection and retrieval were complete.                           Multiple  small and large-mouthed diverticula were                            found in the sigmoid colon and distal descending                            colon. There was narrowing of the colon in                            association with the diverticular opening in the                            mid-sigmoid colon (water immersion used to                            facilitate passing this segment). No definite                            stricture seen, rather severe diverticulosis                            causing luminal narrowing.                           Internal hemorrhoids were found during                            retroflexion. The hemorrhoids were small. Complications:            No immediate complications. Estimated Blood Loss:     Estimated blood loss was minimal. Impression:               - One 4 mm polyp at the hepatic flexure, removed                             with a cold snare. Resected and retrieved.                           - Severe diverticulosis in the sigmoid colon and in                            the distal descending colon. There was narrowing of                            the colon in association with the diverticular                            opening.                           - Normal rectum. No evidence of proctitis.                           - Internal hemorrhoids. Recommendation:           - Patient has a contact number available for  emergencies. The signs and symptoms of potential                            delayed complications were discussed with the                            patient. Return to normal activities tomorrow.                            Written discharge instructions were provided to the                            patient.                           - Resume previous diet.                           - Continue present medications.                           - Await pathology results.                           - Return to Dr. Launa Flight for consideration of                            segmental sigmoid resection for symptomatic                            diverticular disease.                           - Repeat colonoscopy is recommended for                            surveillance. The colonoscopy date will be                            determined after pathology results from today's                            exam become available for review. Jerene Bears, MD 01/22/2018 2:10:46 PM This report has been signed electronically.

## 2018-01-22 NOTE — Patient Instructions (Signed)
Thank you for allowing [place] to care for you today.  Await pathology results by mail, approximately 10 days.  Repeat colonoscopy will be determined after final results.  Resume previous diet and medications today.  Return to normal activities tomorrow.  Return to Dr. Launa Flight for surgical advice.    YOU HAD AN ENDOSCOPIC PROCEDURE TODAY AT [facility]:   Refer to the procedure report that was given to you for any specific questions about what was found during the examination.  If the procedure report does not answer your questions, please call your gastroenterologist to clarify.  If you requested that your care partner not be given the details of your procedure findings, then the procedure report has been included in a sealed envelope for you to review at your convenience later.  YOU SHOULD EXPECT: Some feelings of bloating in the abdomen. Passage of more gas than usual.  Walking can help get rid of the air that was put into your GI tract during the procedure and reduce the bloating. If you had a lower endoscopy (such as a colonoscopy or flexible sigmoidoscopy) you may notice spotting of blood in your stool or on the toilet paper. If you underwent a bowel prep for your procedure, you may not have a normal bowel movement for a few days.  Please Note:  You might notice some irritation and congestion in your nose or some drainage.  This is from the oxygen used during your procedure.  There is no need for concern and it should clear up in a day or so.  SYMPTOMS TO REPORT IMMEDIATELY:   Following lower endoscopy (colonoscopy or flexible sigmoidoscopy):  Excessive amounts of blood in the stool  Significant tenderness or worsening of abdominal pains  Swelling of the abdomen that is new, acute  Fever of 100F or higher    For urgent or emergent issues, a gastroenterologist can be reached at any hour by calling [phone].   DIET:  We do recommend a small meal at first, but  then you may proceed to your regular diet.  Drink plenty of fluids but you should avoid alcoholic beverages for 24 hours.  ACTIVITY:  You should plan to take it easy for the rest of today and you should NOT DRIVE or use heavy machinery until tomorrow (because of the sedation medicines used during the test).    FOLLOW UP: Our staff will call the number listed on your records the next business day following your procedure to check on you and address any questions or concerns that you may have regarding the information given to you following your procedure. If we do not reach you, we will leave a message.  However, if you are feeling well and you are not experiencing any problems, there is no need to return our call.  We will assume that you have returned to your regular daily activities without incident.  If any biopsies were taken you will be contacted by phone or by letter within the next 1-3 weeks.  Please call us at [phone] if you have not heard about the biopsies in 3 weeks.    SIGNATURES/CONFIDENTIALITY: You and/or your care partner have signed paperwork which will be entered into your electronic medical record.  These signatures attest to the fact that that the information above on your After Visit Summary has been reviewed and is understood.  Full responsibility of the confidentiality of this discharge information lies with you and/or your care-partner.

## 2018-01-22 NOTE — Progress Notes (Signed)
Called to room to assist during endoscopic procedure.  Patient ID and intended procedure confirmed with present staff. Received instructions for my participation in the procedure from the performing physician.  

## 2018-01-22 NOTE — Progress Notes (Signed)
A/ox3 pleased with MAC, report to RN 

## 2018-01-22 NOTE — Progress Notes (Signed)
Pt's states no medical or surgical changes since previsit or office visit. 

## 2018-01-23 ENCOUNTER — Telehealth: Payer: Self-pay | Admitting: *Deleted

## 2018-01-23 NOTE — Telephone Encounter (Signed)
  Follow up Call-  Call back number 01/22/2018 03/27/2017  Post procedure Call Back phone  # [phone]  Permission to leave phone message Yes Yes  Some recent data might be hidden     Patient questions:  Do you have a fever, pain , or abdominal swelling? No. Pain Score  0 *  Have you tolerated food without any problems? Yes.    Have you been able to return to your normal activities? Yes.    Do you have any questions about your discharge instructions: Diet   No. Medications  No. Follow up visit  No.  Do you have questions or concerns about your Care? No.  Actions: * If pain score is 4 or above: No action needed, pain <4.

## 2018-01-23 NOTE — Telephone Encounter (Signed)
First follow up call attempt.  Reached voicemail with name identifier .  Message left to call if any questions or concerns. 

## 2018-01-30 ENCOUNTER — Encounter: Payer: Self-pay | Admitting: Internal Medicine

## 2018-04-02 ENCOUNTER — Other Ambulatory Visit: Payer: Self-pay | Admitting: Internal Medicine

## 2018-04-02 DIAGNOSIS — K5909 Other constipation: Secondary | ICD-10-CM

## 2018-04-13 DIAGNOSIS — K56699 Other intestinal obstruction unspecified as to partial versus complete obstruction: Secondary | ICD-10-CM | POA: Insufficient documentation

## 2018-05-06 HISTORY — PX: COLECTOMY: SHX59

## 2018-12-31 DIAGNOSIS — I200 Unstable angina: Secondary | ICD-10-CM | POA: Diagnosis present

## 2018-12-31 DIAGNOSIS — I493 Ventricular premature depolarization: Secondary | ICD-10-CM | POA: Insufficient documentation

## 2018-12-31 DIAGNOSIS — R002 Palpitations: Secondary | ICD-10-CM | POA: Insufficient documentation

## 2019-01-18 DIAGNOSIS — I2510 Atherosclerotic heart disease of native coronary artery without angina pectoris: Secondary | ICD-10-CM | POA: Insufficient documentation

## 2019-01-18 DIAGNOSIS — Z955 Presence of coronary angioplasty implant and graft: Secondary | ICD-10-CM | POA: Insufficient documentation

## 2019-03-15 DIAGNOSIS — Z8546 Personal history of malignant neoplasm of prostate: Secondary | ICD-10-CM | POA: Diagnosis not present

## 2019-03-29 DIAGNOSIS — D751 Secondary polycythemia: Secondary | ICD-10-CM | POA: Diagnosis not present

## 2019-05-01 ENCOUNTER — Encounter: Payer: Self-pay | Admitting: Legal Medicine

## 2019-05-01 ENCOUNTER — Other Ambulatory Visit: Payer: Self-pay

## 2019-05-01 ENCOUNTER — Ambulatory Visit (INDEPENDENT_AMBULATORY_CARE_PROVIDER_SITE_OTHER): Payer: Managed Care, Other (non HMO) | Admitting: Legal Medicine

## 2019-05-01 ENCOUNTER — Other Ambulatory Visit: Payer: Self-pay | Admitting: Legal Medicine

## 2019-05-01 VITALS — BP 148/80 | HR 53 | Temp 96.7°F | Resp 18 | Ht 71.0 in | Wt 216.0 lb

## 2019-05-01 DIAGNOSIS — G8929 Other chronic pain: Secondary | ICD-10-CM

## 2019-05-01 DIAGNOSIS — C61 Malignant neoplasm of prostate: Secondary | ICD-10-CM

## 2019-05-01 DIAGNOSIS — M545 Low back pain: Secondary | ICD-10-CM | POA: Diagnosis not present

## 2019-05-01 MED ORDER — OXYCODONE-ACETAMINOPHEN 10-325 MG PO TABS: 1.0000 | ORAL_TABLET | Freq: Three times a day (TID) | ORAL | 0 refills | Status: AC | PRN

## 2019-05-01 NOTE — Patient Instructions (Signed)
Acute Back Pain, Adult Acute back pain is sudden and usually short-lived. It is often caused by an injury to the muscles and tissues in the back. The injury may result from:  A muscle or ligament getting overstretched or torn (strained). Ligaments are tissues that connect bones to each other. Lifting something improperly can cause a back strain.  Wear and tear (degeneration) of the spinal disks. Spinal disks are circular tissue that provides cushioning between the bones of the spine (vertebrae).  Twisting motions, such as while playing sports or doing yard work.  A hit to the back.  Arthritis. You may have a physical exam, lab tests, and imaging tests to find the cause of your pain. Acute back pain usually goes away with rest and home care. Follow these instructions at home: Managing pain, stiffness, and swelling  Take over-the-counter and prescription medicines only as told by your health care provider.  Your health care provider may recommend applying ice during the first 24-48 hours after your pain starts. To do this: ? Put ice in a plastic bag. ? Place a towel between your skin and the bag. ? Leave the ice on for 20 minutes, 2-3 times a day.  If directed, apply heat to the affected area as often as told by your health care provider. Use the heat source that your health care provider recommends, such as a moist heat pack or a heating pad. ? Place a towel between your skin and the heat source. ? Leave the heat on for 20-30 minutes. ? Remove the heat if your skin turns bright red. This is especially important if you are unable to feel pain, heat, or cold. You have a greater risk of getting burned. Activity   Do not stay in bed. Staying in bed for more than 1-2 days can delay your recovery.  Sit up and stand up straight. Avoid leaning forward when you sit, or hunching over when you stand. ? If you work at a desk, sit close to it so you do not need to lean over. Keep your chin tucked  in. Keep your neck drawn back, and keep your elbows bent at a right angle. Your arms should look like the letter "L." ? Sit high and close to the steering wheel when you drive. Add lower back (lumbar) support to your car seat, if needed.  Take short walks on even surfaces as soon as you are able. Try to increase the length of time you walk each day.  Do not sit, drive, or stand in one place for more than 30 minutes at a time. Sitting or standing for long periods of time can put stress on your back.  Do not drive or use heavy machinery while taking prescription pain medicine.  Use proper lifting techniques. When you bend and lift, use positions that put less stress on your back: ? Bend your knees. ? Keep the load close to your body. ? Avoid twisting.  Exercise regularly as told by your health care provider. Exercising helps your back heal faster and helps prevent back injuries by keeping muscles strong and flexible.  Work with a physical therapist to make a safe exercise program, as recommended by your health care provider. Do any exercises as told by your physical therapist. Lifestyle  Maintain a healthy weight. Extra weight puts stress on your back and makes it difficult to have good posture.  Avoid activities or situations that make you feel anxious or stressed. Stress and anxiety increase muscle   tension and can make back pain worse. Learn ways to manage anxiety and stress, such as through exercise. General instructions  Sleep on a firm mattress in a comfortable position. Try lying on your side with your knees slightly bent. If you lie on your back, put a pillow under your knees.  Follow your treatment plan as told by your health care provider. This may include: ? Cognitive or behavioral therapy. ? Acupuncture or massage therapy. ? Meditation or yoga. Contact a health care provider if:  You have pain that is not relieved with rest or medicine.  You have increasing pain going down  into your legs or buttocks.  Your pain does not improve after 2 weeks.  You have pain at night.  You lose weight without trying.  You have a fever or chills. Get help right away if:  You develop new bowel or bladder control problems.  You have unusual weakness or numbness in your arms or legs.  You develop nausea or vomiting.  You develop abdominal pain.  You feel faint. Summary  Acute back pain is sudden and usually short-lived.  Use proper lifting techniques. When you bend and lift, use positions that put less stress on your back.  Take over-the-counter and prescription medicines and apply heat or ice as directed by your health care provider. This information is not intended to replace advice given to you by your health care provider. Make sure you discuss any questions you have with your health care provider. Document Revised: 06/12/2018 Document Reviewed: 10/05/2016 Elsevier Patient Education  2020 [employer]  

## 2019-05-01 NOTE — Assessment & Plan Note (Signed)
AN INDIVIDUAL CARE PLAN was established and reinforced today.  The patient's status was assessed using clinical findings on exam, labs, and other diagnostic testing. Patient's success at meeting treatment goals based on disease specific evidence-bassed guidelines and found to be in poor control. RECOMMENDATIONS include changing present medicines and treatment. I gave patient oxycodone and back x-ray to rule out any fractures.  Note to RTW Friday if improved.

## 2019-05-01 NOTE — Assessment & Plan Note (Signed)
Patient is having more problems with prostate and urology is suspicious for recurrence.  We will get ultra sensitive PSA. And send to urology.
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
## 2019-05-02 ENCOUNTER — Ambulatory Visit: Payer: Medicare Other | Admitting: Legal Medicine

## 2019-05-02 LAB — PSA, ULTRASENSITIVE: PSA, Ultrasensitive: <0.014 ng/mL (ref 0.000–4.000)

## 2019-05-03 ENCOUNTER — Telehealth: Payer: Self-pay

## 2019-05-03 ENCOUNTER — Encounter: Payer: Self-pay | Admitting: Legal Medicine

## 2019-05-03 NOTE — Telephone Encounter (Signed)
Left detailed message of the results.

## 2019-05-05 NOTE — Progress Notes (Signed)
PSA < 0.014  good lp

## 2019-05-16 ENCOUNTER — Telehealth (INDEPENDENT_AMBULATORY_CARE_PROVIDER_SITE_OTHER): Payer: Managed Care, Other (non HMO) | Admitting: Legal Medicine

## 2019-05-16 ENCOUNTER — Encounter: Payer: Self-pay | Admitting: Legal Medicine

## 2019-05-16 DIAGNOSIS — J301 Allergic rhinitis due to pollen: Secondary | ICD-10-CM

## 2019-05-16 DIAGNOSIS — J309 Allergic rhinitis, unspecified: Secondary | ICD-10-CM | POA: Insufficient documentation

## 2019-05-16 DIAGNOSIS — E785 Hyperlipidemia, unspecified: Secondary | ICD-10-CM | POA: Insufficient documentation

## 2019-05-16 MED ORDER — PREDNISONE 10 MG (21) PO TBPK: ORAL_TABLET | ORAL | 0 refills | Status: DC

## 2019-05-16 MED ORDER — BENZONATATE 200 MG PO CAPS: 200.0000 mg | ORAL_CAPSULE | Freq: Two times a day (BID) | ORAL | 0 refills | Status: DC | PRN
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
## 2019-05-16 NOTE — Progress Notes (Signed)
   Acute Office Visit  Subjective:                   Objective:         Assessment & Plan:

## 2019-05-20 ENCOUNTER — Telehealth: Payer: Medicare Other | Admitting: Legal Medicine

## 2019-05-20 ENCOUNTER — Telehealth (INDEPENDENT_AMBULATORY_CARE_PROVIDER_SITE_OTHER): Payer: Managed Care, Other (non HMO) | Admitting: Legal Medicine

## 2019-05-20 ENCOUNTER — Encounter: Payer: Self-pay | Admitting: Legal Medicine

## 2019-05-20 ENCOUNTER — Telehealth: Payer: Self-pay

## 2019-05-20 VITALS — BP 139/82 | HR 55 | Temp 98.0°F | Ht 71.0 in | Wt 210.0 lb

## 2019-05-20 DIAGNOSIS — I2510 Atherosclerotic heart disease of native coronary artery without angina pectoris: Secondary | ICD-10-CM

## 2019-05-20 DIAGNOSIS — R0602 Shortness of breath: Secondary | ICD-10-CM

## 2019-05-20 NOTE — Assessment & Plan Note (Signed)
Patient has had severe dyspnea and saw his cardiologist.  Lasix no help.  Cough medicines no help. He needs COVID test and chest x-ray. May need pulmonary function tests
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
## 2019-05-20 NOTE — Telephone Encounter (Signed)
Patient called in asking for something for the cough, it will not stop and he has tried the tesselon and it has not worked

## 2019-05-21 ENCOUNTER — Other Ambulatory Visit: Payer: Self-pay

## 2019-05-21 ENCOUNTER — Ambulatory Visit (INDEPENDENT_AMBULATORY_CARE_PROVIDER_SITE_OTHER): Payer: Managed Care, Other (non HMO) | Admitting: Legal Medicine

## 2019-05-21 ENCOUNTER — Encounter: Payer: Self-pay | Admitting: Legal Medicine

## 2019-05-21 ENCOUNTER — Other Ambulatory Visit: Payer: Self-pay | Admitting: Legal Medicine

## 2019-05-21 VITALS — BP 130/70 | HR 68 | Temp 98.3°F | Ht 71.0 in | Wt 218.0 lb

## 2019-05-21 DIAGNOSIS — J301 Allergic rhinitis due to pollen: Secondary | ICD-10-CM

## 2019-05-21 DIAGNOSIS — I2510 Atherosclerotic heart disease of native coronary artery without angina pectoris: Secondary | ICD-10-CM | POA: Diagnosis not present

## 2019-05-21 DIAGNOSIS — I25110 Atherosclerotic heart disease of native coronary artery with unstable angina pectoris: Secondary | ICD-10-CM | POA: Insufficient documentation

## 2019-05-21 LAB — NOVEL CORONAVIRUS, NAA: SARS-CoV-2, NAA: NOT DETECTED

## 2019-05-21 MED ORDER — HYDROCOD POLST-CPM POLST ER 10-8 MG/5ML PO SUER: 5.0000 mL | Freq: Two times a day (BID) | ORAL | 0 refills | Status: DC

## 2019-05-21 MED ORDER — NITROGLYCERIN 0.4 MG SL SUBL
0.4000 mg | SUBLINGUAL_TABLET | Freq: Once | SUBLINGUAL | Status: AC
  Administered 2019-05-21: 0.4 mg via SUBLINGUAL

## 2019-05-21 MED ORDER — BENZONATATE 200 MG PO CAPS: 200.0000 mg | ORAL_CAPSULE | Freq: Two times a day (BID) | ORAL | 0 refills | Status: DC | PRN

## 2019-05-21 NOTE — Assessment & Plan Note (Signed)
Patient has been having chest pressure substernally for 10 days associated with cough.  He had a lexiscan on 3.9.2021 z[place] with reversible ischemia and EF 30%.  I have tried to call Dr. Otho Perl but unable to reach him.  Patient was given 2 NTG with pain relief.  ASA.  And O2.  I will transport to [facility] where his cardiologist is for unstablle angina and possible I.
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
## 2019-05-21 NOTE — Progress Notes (Signed)
Negative COVID test lp

## 2019-05-21 NOTE — Assessment & Plan Note (Signed)
Patient has 2 stents and new reversible ischemia on Lexiscan.  Patient has no nitroglycerine.

## 2019-05-22 MED ORDER — ASPIRIN 81 MG PO TBEC
81.00 | DELAYED_RELEASE_TABLET | ORAL | Status: DC
Start: 2019-05-22 — End: 2019-05-22

## 2019-05-22 MED ORDER — HYDROXYZINE HCL 25 MG PO TABS
25.00 | ORAL_TABLET | ORAL | Status: DC
Start: ? — End: 2019-05-22

## 2019-05-22 MED ORDER — FLUTICASONE PROPIONATE 50 MCG/ACT NA SUSP
1.00 | NASAL | Status: DC
Start: 2019-05-22 — End: 2019-05-22

## 2019-05-22 MED ORDER — SODIUM CHLORIDE FLUSH 0.9 % IV SOLN
5.00 | INTRAVENOUS | Status: DC
Start: 2019-05-22 — End: 2019-05-22

## 2019-05-22 MED ORDER — NITROGLYCERIN 0.4 MG SL SUBL
0.40 | SUBLINGUAL_TABLET | SUBLINGUAL | Status: DC
Start: ? — End: 2019-05-22

## 2019-05-22 MED ORDER — ZOLPIDEM TARTRATE 5 MG PO TABS
5.00 | ORAL_TABLET | ORAL | Status: DC
Start: ? — End: 2019-05-22

## 2019-05-22 MED ORDER — ACETAMINOPHEN 325 MG PO TABS
650.00 | ORAL_TABLET | ORAL | Status: DC
Start: ? — End: 2019-05-22

## 2019-05-22 MED ORDER — LEVOFLOXACIN IN D5W 750 MG/150ML IV SOLN
750.00 | INTRAVENOUS | Status: DC
Start: 2019-05-23 — End: 2019-05-22

## 2019-05-22 MED ORDER — ENOXAPARIN SODIUM 40 MG/0.4ML ~~LOC~~ SOLN
40.00 | SUBCUTANEOUS | Status: DC
Start: 2019-05-22 — End: 2019-05-22

## 2019-05-22 MED ORDER — CLONAZEPAM 0.5 MG PO TABS
0.50 | ORAL_TABLET | ORAL | Status: DC
Start: ? — End: 2019-05-22

## 2019-05-22 MED ORDER — BISACODYL 5 MG PO TBEC
10.00 | DELAYED_RELEASE_TABLET | ORAL | Status: DC
Start: ? — End: 2019-05-22

## 2019-05-22 MED ORDER — ONDANSETRON HCL 4 MG/2ML IJ SOLN
4.00 | INTRAMUSCULAR | Status: DC
Start: ? — End: 2019-05-22

## 2019-05-22 MED ORDER — SODIUM CHLORIDE 0.9 % IV SOLN
INTRAVENOUS | Status: DC
Start: ? — End: 2019-05-22

## 2019-05-22 MED ORDER — DEXTROMETHORPHAN-GUAIFENESIN 10-100 MG/5ML PO LIQD
5.00 | ORAL | Status: DC
Start: ? — End: 2019-05-22

## 2019-05-22 MED ORDER — TICAGRELOR 90 MG PO TABS
90.00 | ORAL_TABLET | ORAL | Status: DC
Start: 2019-05-22 — End: 2019-05-22

## 2019-05-22 MED ORDER — DSS 100 MG PO CAPS
100.00 | ORAL_CAPSULE | ORAL | Status: DC
Start: ? — End: 2019-05-22

## 2019-05-22 MED ORDER — SODIUM CHLORIDE FLUSH 0.9 % IV SOLN
5.00 | INTRAVENOUS | Status: DC
Start: ? — End: 2019-05-22

## 2019-05-29 ENCOUNTER — Inpatient Hospital Stay: Payer: Managed Care, Other (non HMO) | Admitting: Legal Medicine

## 2019-05-30 ENCOUNTER — Other Ambulatory Visit: Payer: Self-pay

## 2019-05-30 ENCOUNTER — Ambulatory Visit (INDEPENDENT_AMBULATORY_CARE_PROVIDER_SITE_OTHER): Payer: Managed Care, Other (non HMO) | Admitting: Legal Medicine

## 2019-05-30 ENCOUNTER — Encounter: Payer: Self-pay | Admitting: Legal Medicine

## 2019-05-30 VITALS — BP 142/80 | HR 62 | Temp 96.9°F | Resp 17 | Ht 71.0 in | Wt 219.0 lb

## 2019-05-30 DIAGNOSIS — I2510 Atherosclerotic heart disease of native coronary artery without angina pectoris: Secondary | ICD-10-CM | POA: Diagnosis not present

## 2019-05-30 NOTE — Assessment & Plan Note (Signed)
Patient had unstable angina and has recent cath.  He is being treated with medicinal therapy.  He will need cardiac rehabililitation.  Follow up 2 weeks.
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
## 2019-06-05 ENCOUNTER — Encounter (HOSPITAL_COMMUNITY): Payer: Self-pay

## 2019-06-05 ENCOUNTER — Inpatient Hospital Stay (HOSPITAL_COMMUNITY)
Admission: EM | Admit: 2019-06-05 | Discharge: 2019-06-07 | DRG: 303 | Disposition: A | Discharge status: Home / Self Care | Payer: Managed Care, Other (non HMO) | Attending: Cardiology | Admitting: Cardiology

## 2019-06-05 ENCOUNTER — Emergency Department (HOSPITAL_COMMUNITY): Payer: Managed Care, Other (non HMO)

## 2019-06-05 DIAGNOSIS — R61 Generalized hyperhidrosis: Secondary | ICD-10-CM | POA: Diagnosis present

## 2019-06-05 DIAGNOSIS — R079 Chest pain, unspecified: Secondary | ICD-10-CM | POA: Diagnosis not present

## 2019-06-05 DIAGNOSIS — Z880 Allergy status to penicillin: Secondary | ICD-10-CM

## 2019-06-05 DIAGNOSIS — Z20822 Contact with and (suspected) exposure to covid-19: Secondary | ICD-10-CM | POA: Diagnosis present

## 2019-06-05 DIAGNOSIS — R109 Unspecified abdominal pain: Secondary | ICD-10-CM

## 2019-06-05 DIAGNOSIS — Z833 Family history of diabetes mellitus: Secondary | ICD-10-CM

## 2019-06-05 DIAGNOSIS — Z955 Presence of coronary angioplasty implant and graft: Secondary | ICD-10-CM

## 2019-06-05 DIAGNOSIS — Z79899 Other long term (current) drug therapy: Secondary | ICD-10-CM

## 2019-06-05 DIAGNOSIS — R001 Bradycardia, unspecified: Secondary | ICD-10-CM | POA: Diagnosis present

## 2019-06-05 DIAGNOSIS — Z8371 Family history of colonic polyps: Secondary | ICD-10-CM

## 2019-06-05 DIAGNOSIS — I25119 Atherosclerotic heart disease of native coronary artery with unspecified angina pectoris: Principal | ICD-10-CM | POA: Diagnosis present

## 2019-06-05 DIAGNOSIS — Z8546 Personal history of malignant neoplasm of prostate: Secondary | ICD-10-CM

## 2019-06-05 DIAGNOSIS — R1013 Epigastric pain: Secondary | ICD-10-CM

## 2019-06-05 DIAGNOSIS — Z8042 Family history of malignant neoplasm of prostate: Secondary | ICD-10-CM

## 2019-06-05 DIAGNOSIS — Z8616 Personal history of COVID-19: Secondary | ICD-10-CM

## 2019-06-05 DIAGNOSIS — Z96642 Presence of left artificial hip joint: Secondary | ICD-10-CM | POA: Diagnosis present

## 2019-06-05 DIAGNOSIS — I2510 Atherosclerotic heart disease of native coronary artery without angina pectoris: Secondary | ICD-10-CM

## 2019-06-05 DIAGNOSIS — Z7982 Long term (current) use of aspirin: Secondary | ICD-10-CM

## 2019-06-05 DIAGNOSIS — F419 Anxiety disorder, unspecified: Secondary | ICD-10-CM

## 2019-06-05 DIAGNOSIS — I10 Essential (primary) hypertension: Secondary | ICD-10-CM | POA: Diagnosis present

## 2019-06-05 DIAGNOSIS — K5900 Constipation, unspecified: Secondary | ICD-10-CM | POA: Diagnosis present

## 2019-06-05 DIAGNOSIS — H9190 Unspecified hearing loss, unspecified ear: Secondary | ICD-10-CM | POA: Diagnosis present

## 2019-06-05 DIAGNOSIS — Z8249 Family history of ischemic heart disease and other diseases of the circulatory system: Secondary | ICD-10-CM

## 2019-06-05 DIAGNOSIS — E785 Hyperlipidemia, unspecified: Secondary | ICD-10-CM | POA: Diagnosis present

## 2019-06-05 DIAGNOSIS — Z820 Family history of epilepsy and other diseases of the nervous system: Secondary | ICD-10-CM

## 2019-06-05 DIAGNOSIS — Z823 Family history of stroke: Secondary | ICD-10-CM

## 2019-06-05 LAB — BASIC METABOLIC PANEL
Anion gap: 9 (ref 5–15)
BUN: 13 mg/dL (ref 8–23)
CO2: 23 mmol/L (ref 22–32)
Calcium: 9.1 mg/dL (ref 8.9–10.3)
Chloride: 105 mmol/L (ref 98–111)
Creatinine, Ser: 1.2 mg/dL (ref 0.61–1.24)
GFR calc Af Amer: >60 mL/min (ref >60)
GFR calc non Af Amer: >60 mL/min (ref >60)
Glucose, Bld: 128 mg/dL — ABNORMAL HIGH (ref 70–99)
Potassium: 4.8 mmol/L (ref 3.5–5.1)
Sodium: 137 mmol/L (ref 135–145)

## 2019-06-05 LAB — LIPASE, BLOOD: Lipase: 29 U/L (ref 11–51)

## 2019-06-05 LAB — HEPATIC FUNCTION PANEL
ALT: 41 U/L (ref 0–44)
AST: 22 U/L (ref 15–41)
Albumin: 3.7 g/dL (ref 3.5–5.0)
Alkaline Phosphatase: 75 U/L (ref 38–126)
Bilirubin, Direct: 0.2 mg/dL (ref 0.0–0.2)
Indirect Bilirubin: 0.7 mg/dL (ref 0.3–0.9)
Total Bilirubin: 0.9 mg/dL (ref 0.3–1.2)
Total Protein: 6.9 g/dL (ref 6.5–8.1)

## 2019-06-05 LAB — CBC WITH DIFFERENTIAL/PLATELET
Abs Immature Granulocytes: 0.03 10*3/uL (ref 0.00–0.07)
Basophils Absolute: 0 10*3/uL (ref 0.0–0.1)
Basophils Relative: 0 %
Eosinophils Absolute: 0.1 10*3/uL (ref 0.0–0.5)
Eosinophils Relative: 2 %
HCT: 45.6 % (ref 39.0–52.0)
Hemoglobin: 15.2 g/dL (ref 13.0–17.0)
Immature Granulocytes: 0 %
Lymphocytes Relative: 14 %
Lymphs Abs: 1.1 10*3/uL (ref 0.7–4.0)
MCH: 30.2 pg (ref 26.0–34.0)
MCHC: 33.3 g/dL (ref 30.0–36.0)
MCV: 90.5 fL (ref 80.0–100.0)
Monocytes Absolute: 0.4 10*3/uL (ref 0.1–1.0)
Monocytes Relative: 5 %
Neutro Abs: 6.4 10*3/uL (ref 1.7–7.7)
Neutrophils Relative %: 79 %
Platelets: 206 10*3/uL (ref 150–400)
RBC: 5.04 MIL/uL (ref 4.22–5.81)
RDW: 14.8 % (ref 11.5–15.5)
WBC: 8 10*3/uL (ref 4.0–10.5)
nRBC: 0 % (ref 0.0–0.2)

## 2019-06-05 LAB — BRAIN NATRIURETIC PEPTIDE: B Natriuretic Peptide: 67.2 pg/mL (ref 0.0–100.0)

## 2019-06-05 LAB — TROPONIN I (HIGH SENSITIVITY): Troponin I (High Sensitivity): 8 ng/L (ref <18)

## 2019-06-05 LAB — TYPE AND SCREEN
ABO/RH(D): A NEG
Antibody Screen: NEGATIVE

## 2019-06-05 LAB — ABO/RH: ABO/RH(D): A NEG

## 2019-06-05 LAB — MAGNESIUM: Magnesium: 1.9 mg/dL (ref 1.7–2.4)

## 2019-06-05 LAB — LACTIC ACID, PLASMA: Lactic Acid, Venous: 1.7 mmol/L (ref 0.5–1.9)

## 2019-06-05 MED ORDER — ONDANSETRON HCL 4 MG/2ML IJ SOLN
4.0000 mg | Freq: Once | INTRAMUSCULAR | Status: AC
  Administered 2019-06-05: 4 mg via INTRAVENOUS
  Filled 2019-06-05: qty 2

## 2019-06-05 NOTE — ED Provider Notes (Signed)
11:16 PM Assumed care from Dr. Roslynn Amble, please see their note for full history, physical and decision making until this point. In brief this is a 64 y.o. year old male who presented to the ED tonight with Flank Pain and Emesis     Here with left flank pain, epigastric pain, chest pain and diaphoretic pale. Loletha Grayer with EMS and looked unstable.  Mild epigastric ttp.  Pending labs --> CT for angio and abdomen. If labs and ct ok and patient looks fine then discharge, if not then admit.  On my exam patient does endorse epigastric and left chest pain.  Patient states associated with shortness of breath and diaphoresis and generalized weakness.  He also vomited multiple times.  Patient states that the symptoms were not really made better with vomiting.  He vomited even once when he was here but his nausea does seem to gradually go away or possibly be helped by Zofran. Labs overall unremarkable. ecg unremarkable. Will proceed with previous plan of cta and CT a/p.   Discharge instructions, including strict return precautions for new or worsening symptoms, given. Patient and/or family verbalized understanding and agreement with the plan as described.   Labs, studies and imaging reviewed by myself and considered in medical decision making if ordered. Imaging interpreted by radiology.  Labs Reviewed  BASIC METABOLIC PANEL - Abnormal; Notable for the following components:      Result Value   Glucose, Bld 128 (*)    All other components within normal limits  CBC WITH DIFFERENTIAL/PLATELET  HEPATIC FUNCTION PANEL  LACTIC ACID, PLASMA  BRAIN NATRIURETIC PEPTIDE  MAGNESIUM  LIPASE, BLOOD  LACTIC ACID, PLASMA  URINALYSIS, ROUTINE W REFLEX MICROSCOPIC  TYPE AND SCREEN  ABO/RH  TROPONIN I (HIGH SENSITIVITY)  TROPONIN I (HIGH SENSITIVITY)    DG Chest Portable 1 View  Final Result      No follow-ups on file.    Merrily Pew, MD 06/06/19 [phone]

## 2019-06-05 NOTE — ED Triage Notes (Signed)
Pt comes via [facility] for 3 days of L sided flank pain with n/v, generalized weakeness and malaise during transport pt vomited and then HR went brady into the 40's.
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
## 2019-06-06 ENCOUNTER — Encounter (HOSPITAL_COMMUNITY): Payer: Self-pay | Admitting: Cardiology

## 2019-06-06 ENCOUNTER — Other Ambulatory Visit: Payer: Self-pay

## 2019-06-06 ENCOUNTER — Emergency Department (HOSPITAL_COMMUNITY): Payer: Managed Care, Other (non HMO)

## 2019-06-06 DIAGNOSIS — Z79899 Other long term (current) drug therapy: Secondary | ICD-10-CM | POA: Diagnosis not present

## 2019-06-06 DIAGNOSIS — Z8249 Family history of ischemic heart disease and other diseases of the circulatory system: Secondary | ICD-10-CM | POA: Diagnosis not present

## 2019-06-06 DIAGNOSIS — I25118 Atherosclerotic heart disease of native coronary artery with other forms of angina pectoris: Secondary | ICD-10-CM

## 2019-06-06 DIAGNOSIS — E785 Hyperlipidemia, unspecified: Secondary | ICD-10-CM

## 2019-06-06 DIAGNOSIS — Z8546 Personal history of malignant neoplasm of prostate: Secondary | ICD-10-CM | POA: Diagnosis not present

## 2019-06-06 DIAGNOSIS — I10 Essential (primary) hypertension: Secondary | ICD-10-CM

## 2019-06-06 DIAGNOSIS — Z880 Allergy status to penicillin: Secondary | ICD-10-CM | POA: Diagnosis not present

## 2019-06-06 DIAGNOSIS — R61 Generalized hyperhidrosis: Secondary | ICD-10-CM | POA: Diagnosis present

## 2019-06-06 DIAGNOSIS — Z8371 Family history of colonic polyps: Secondary | ICD-10-CM | POA: Diagnosis not present

## 2019-06-06 DIAGNOSIS — I25119 Atherosclerotic heart disease of native coronary artery with unspecified angina pectoris: Secondary | ICD-10-CM | POA: Diagnosis present

## 2019-06-06 DIAGNOSIS — K5900 Constipation, unspecified: Secondary | ICD-10-CM | POA: Diagnosis present

## 2019-06-06 DIAGNOSIS — F419 Anxiety disorder, unspecified: Secondary | ICD-10-CM

## 2019-06-06 DIAGNOSIS — R001 Bradycardia, unspecified: Secondary | ICD-10-CM | POA: Diagnosis present

## 2019-06-06 DIAGNOSIS — I259 Chronic ischemic heart disease, unspecified: Secondary | ICD-10-CM

## 2019-06-06 DIAGNOSIS — Z96642 Presence of left artificial hip joint: Secondary | ICD-10-CM | POA: Diagnosis present

## 2019-06-06 DIAGNOSIS — Z8616 Personal history of COVID-19: Secondary | ICD-10-CM | POA: Diagnosis not present

## 2019-06-06 DIAGNOSIS — Z8042 Family history of malignant neoplasm of prostate: Secondary | ICD-10-CM | POA: Diagnosis not present

## 2019-06-06 DIAGNOSIS — Z955 Presence of coronary angioplasty implant and graft: Secondary | ICD-10-CM | POA: Diagnosis not present

## 2019-06-06 DIAGNOSIS — I2510 Atherosclerotic heart disease of native coronary artery without angina pectoris: Secondary | ICD-10-CM | POA: Diagnosis not present

## 2019-06-06 DIAGNOSIS — Z820 Family history of epilepsy and other diseases of the nervous system: Secondary | ICD-10-CM | POA: Diagnosis not present

## 2019-06-06 DIAGNOSIS — R079 Chest pain, unspecified: Secondary | ICD-10-CM | POA: Diagnosis not present

## 2019-06-06 DIAGNOSIS — R1013 Epigastric pain: Secondary | ICD-10-CM | POA: Diagnosis present

## 2019-06-06 DIAGNOSIS — Z823 Family history of stroke: Secondary | ICD-10-CM | POA: Diagnosis not present

## 2019-06-06 DIAGNOSIS — Z833 Family history of diabetes mellitus: Secondary | ICD-10-CM | POA: Diagnosis not present

## 2019-06-06 DIAGNOSIS — Z7982 Long term (current) use of aspirin: Secondary | ICD-10-CM | POA: Diagnosis not present

## 2019-06-06 DIAGNOSIS — H9190 Unspecified hearing loss, unspecified ear: Secondary | ICD-10-CM | POA: Diagnosis present

## 2019-06-06 DIAGNOSIS — Z20822 Contact with and (suspected) exposure to covid-19: Secondary | ICD-10-CM | POA: Diagnosis present

## 2019-06-06 LAB — BASIC METABOLIC PANEL
Anion gap: 13 (ref 5–15)
BUN: 14 mg/dL (ref 8–23)
CO2: 19 mmol/L — ABNORMAL LOW (ref 22–32)
Calcium: 8.9 mg/dL (ref 8.9–10.3)
Chloride: 105 mmol/L (ref 98–111)
Creatinine, Ser: 0.89 mg/dL (ref 0.61–1.24)
GFR calc Af Amer: >60 mL/min (ref >60)
GFR calc non Af Amer: >60 mL/min (ref >60)
Glucose, Bld: 103 mg/dL — ABNORMAL HIGH (ref 70–99)
Potassium: 3.9 mmol/L (ref 3.5–5.1)
Sodium: 137 mmol/L (ref 135–145)

## 2019-06-06 LAB — LIPID PANEL
Cholesterol: 138 mg/dL (ref 0–200)
HDL: 29 mg/dL — ABNORMAL LOW (ref >40)
LDL Cholesterol: 80 mg/dL (ref 0–99)
Total CHOL/HDL Ratio: 4.8 RATIO
Triglycerides: 143 mg/dL (ref <150)
VLDL: 29 mg/dL (ref 0–40)

## 2019-06-06 LAB — CBC
HCT: 41.7 % (ref 39.0–52.0)
Hemoglobin: 14 g/dL (ref 13.0–17.0)
MCH: 30.2 pg (ref 26.0–34.0)
MCHC: 33.6 g/dL (ref 30.0–36.0)
MCV: 90.1 fL (ref 80.0–100.0)
Platelets: 208 10*3/uL (ref 150–400)
RBC: 4.63 MIL/uL (ref 4.22–5.81)
RDW: 14.9 % (ref 11.5–15.5)
WBC: 6.4 10*3/uL (ref 4.0–10.5)
nRBC: 0 % (ref 0.0–0.2)

## 2019-06-06 LAB — HIV ANTIBODY (ROUTINE TESTING W REFLEX): HIV Screen 4th Generation wRfx: NONREACTIVE

## 2019-06-06 LAB — LACTIC ACID, PLASMA: Lactic Acid, Venous: 3.8 mmol/L (ref 0.5–1.9)

## 2019-06-06 LAB — TROPONIN I (HIGH SENSITIVITY): Troponin I (High Sensitivity): 7 ng/L (ref <18)

## 2019-06-06 LAB — SARS CORONAVIRUS 2 (TAT 6-24 HRS): SARS Coronavirus 2: NEGATIVE

## 2019-06-06 MED ORDER — NITROGLYCERIN 0.4 MG SL SUBL: 0.4000 mg | SUBLINGUAL_TABLET | SUBLINGUAL | Status: DC | PRN

## 2019-06-06 MED ORDER — CLONAZEPAM 0.5 MG PO TABS
0.5000 mg | ORAL_TABLET | Freq: Two times a day (BID) | ORAL | Status: DC | PRN
  Administered 2019-06-06 – 2019-06-07 (×3): 0.5 mg via ORAL
  Filled 2019-06-06 (×3): qty 1

## 2019-06-06 MED ORDER — ONDANSETRON HCL 4 MG/2ML IJ SOLN: 4.0000 mg | Freq: Four times a day (QID) | INTRAMUSCULAR | Status: DC | PRN

## 2019-06-06 MED ORDER — LACTATED RINGERS IV BOLUS
1000.0000 mL | Freq: Once | INTRAVENOUS | Status: AC
  Administered 2019-06-06: 1000 mL via INTRAVENOUS

## 2019-06-06 MED ORDER — ZOLPIDEM TARTRATE 5 MG PO TABS
5.0000 mg | ORAL_TABLET | Freq: Every evening | ORAL | Status: DC | PRN
  Administered 2019-06-06: 5 mg via ORAL
  Filled 2019-06-06: qty 1

## 2019-06-06 MED ORDER — LISINOPRIL 5 MG PO TABS
5.0000 mg | ORAL_TABLET | Freq: Every day | ORAL | Status: DC
  Administered 2019-06-06 – 2019-06-07 (×2): 5 mg via ORAL
  Filled 2019-06-06 (×2): qty 1

## 2019-06-06 MED ORDER — ACETAMINOPHEN 325 MG PO TABS: 650.0000 mg | ORAL_TABLET | ORAL | Status: DC | PRN

## 2019-06-06 MED ORDER — ATORVASTATIN CALCIUM 80 MG PO TABS
80.0000 mg | ORAL_TABLET | Freq: Every day | ORAL | Status: DC
  Administered 2019-06-06: 80 mg via ORAL
  Filled 2019-06-06: qty 1

## 2019-06-06 MED ORDER — ISOSORBIDE MONONITRATE ER 30 MG PO TB24
30.0000 mg | ORAL_TABLET | Freq: Every day | ORAL | Status: DC
  Administered 2019-06-06 – 2019-06-07 (×2): 30 mg via ORAL
  Filled 2019-06-06 (×2): qty 1

## 2019-06-06 MED ORDER — TICAGRELOR 90 MG PO TABS
90.0000 mg | ORAL_TABLET | Freq: Two times a day (BID) | ORAL | Status: DC
  Administered 2019-06-06 – 2019-06-07 (×3): 90 mg via ORAL
  Filled 2019-06-06 (×3): qty 1

## 2019-06-06 MED ORDER — AMLODIPINE BESYLATE 5 MG PO TABS
5.0000 mg | ORAL_TABLET | Freq: Every day | ORAL | Status: DC
  Administered 2019-06-06 – 2019-06-07 (×2): 5 mg via ORAL
  Filled 2019-06-06 (×2): qty 1

## 2019-06-06 MED ORDER — ASPIRIN EC 81 MG PO TBEC
81.0000 mg | DELAYED_RELEASE_TABLET | Freq: Every day | ORAL | Status: DC
  Administered 2019-06-07: 81 mg via ORAL
  Filled 2019-06-06: qty 1

## 2019-06-06 MED ORDER — IOHEXOL 350 MG/ML SOLN
100.0000 mL | Freq: Once | INTRAVENOUS | Status: AC | PRN
  Administered 2019-06-06: 100 mL via INTRAVENOUS

## 2019-06-06 MED ORDER — ALPRAZOLAM 0.25 MG PO TABS: 0.2500 mg | ORAL_TABLET | Freq: Two times a day (BID) | ORAL | Status: DC | PRN
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
## 2019-06-06 NOTE — ED Notes (Signed)
bfast ordered 

## 2019-06-06 NOTE — Progress Notes (Signed)
Patient arrived to unit in NAD, VS stable. Patient free from pain. Wife aware of patient being transferred to room 5C18.

## 2019-06-06 NOTE — Progress Notes (Signed)
Progress Note  Patient Name: Jacob Collier Date of Encounter: 06/06/2019  Primary Cardiologist: Dr Otho Perl, [place] last evening after experiencing some increasing shortness of breath with left arm radiation nausea.  Inpatient Medications    Scheduled Meds: . [START ON 06/07/2019] aspirin EC  81 mg Oral Daily  . atorvastatin  80 mg Oral q1800  . isosorbide mononitrate  30 mg Oral Daily  . lisinopril  5 mg Oral Daily  . ticagrelor  90 mg Oral BID   Continuous Infusions:  PRN Meds: acetaminophen, clonazePAM, nitroGLYCERIN, ondansetron (ZOFRAN) IV, zolpidem   Vital Signs    Vitals:   06/06/19 0930 06/06/19 1000 06/06/19 1053 06/06/19 1220  BP: 138/81 129/86 137/83 122/71  Pulse: 74 73 69 70  Resp: 18 20 20 18   Temp:   98.2 F (36.8 C) 98 F (36.7 C)  TempSrc:   Oral Oral  SpO2: 98% 98% 98% 98%  Weight:   96.2 kg   Height:   5\' 11"  (1.803 m)     Intake/Output Summary (Last 24 hours) at 06/06/2019 1441 Last data filed at 06/06/2019 0519 Gross per 24 hour  Intake 1000 ml  Output --  Net 1000 ml    I/O since admission: +1000  Filed Weights   06/06/19 1053  Weight: 96.2 kg    Telemetry    Sinus- Personally Reviewed  ECG    ECG (independently read by me): Sinus bradycardia 53 bpm, small inferior Q waves.  Mild lateral ST changes.  QTc interval 468.  Physical Exam   BP 122/71 (BP Location: Left Arm)   Pulse 70   Temp 98 F (36.7 C) (Oral)   Resp 18   Ht 5\' 11"  (1.803 m)   Wt 96.2 kg   SpO2 98%   BMI 29.58 kg/m  General: Alert, oriented, no distress.  Skin: normal turgor, no rashes, warm and dry HEENT: Normocephalic, atraumatic. Pupils equal round and reactive to light; sclera anicteric; extraocular muscles intact;  Nose without nasal septal hypertrophy Mouth/Parynx benign;  Neck: No JVD, no carotid bruits; normal carotid upstroke Lungs: clear to ausculatation and percussion; no wheezing or rales Chest wall: without  tenderness to palpitation Heart: PMI not displaced, RRR, s1 s2 normal, 1/6 systolic murmur, no diastolic murmur, no rubs, gallops, thrills, or heaves Abdomen: soft, nontender; no hepatosplenomehaly, BS+; abdominal aorta nontender and not dilated by palpation. Back: no CVA tenderness Pulses 2+ Musculoskeletal: full range of motion, normal strength, no joint deformities Extremities: no clubbing cyanosis or edema, Homan's sign negative  Neurologic: grossly nonfocal; Cranial nerves grossly wnl Psychologic: Normal mood and affect   Labs    Chemistry Recent Labs  Lab 06/05/19 2231 06/06/19 0612  NA 137 137  K 4.8 3.9  CL 105 105  CO2 23 19*  GLUCOSE 128* 103*  BUN 13 14  CREATININE 1.20 0.89  CALCIUM 9.1 8.9  PROT 6.9  --   ALBUMIN 3.7  --   AST 22  --   ALT 41  --   ALKPHOS 75  --   BILITOT 0.9  --   GFRNONAA >60 >60  GFRAA >60 >60  ANIONGAP 9 13     Hematology Recent Labs  Lab 06/05/19 2231 06/06/19 0612  WBC 8.0 6.4  RBC 5.04 4.63  HGB 15.2 14.0  HCT 45.6 41.7  MCV 90.5 90.1  MCH 30.2 30.2  MCHC 33.3 33.6  RDW 14.8 14.9  PLT 206 208  Cardiac EnzymesNo results for input(s): TROPONINI in the last 168 hours. No results for input(s): TROPIPOC in the last 168 hours.   BNP Recent Labs  Lab 06/05/19 2231  BNP 67.2     DDimer No results for input(s): DDIMER in the last 168 hours.   Lipid Panel     Component Value Date/Time   CHOL 138 06/06/2019 0612   TRIG 143 06/06/2019 0612   HDL 29 (L) 06/06/2019 0612   CHOLHDL 4.8 06/06/2019 0612   VLDL 29 06/06/2019 0612   LDLCALC 80 06/06/2019 0612     Radiology    CT Angio Chest PE W and/or Wo Contrast  Result Date: 06/06/2019 CLINICAL DATA:  Shortness of breath. Prostate cancer, status post prostatectomy. Constipation. EXAM: CT ANGIOGRAPHY CHEST CT ABDOMEN AND PELVIS WITH CONTRAST TECHNIQUE: Multidetector CT imaging of the chest was performed using the standard protocol during bolus administration of  intravenous contrast. Multiplanar CT image reconstructions and MIPs were obtained to evaluate the vascular anatomy. Multidetector CT imaging of the abdomen and pelvis was performed using the standard protocol during bolus administration of intravenous contrast. CONTRAST:  124mL OMNIPAQUE IOHEXOL 350 MG/ML SOLN COMPARISON:  CT abdomen/pelvis dated 05/11/2017 FINDINGS: CTA CHEST FINDINGS Cardiovascular: Satisfactory opacification the bilateral pulmonary arteries to the lobar level. No evidence of pulmonary embolism. Not tailored for evaluation of the thoracic aorta. No evidence of thoracic aortic aneurysm. Mild atherosclerotic calcifications of the aortic arch. The heart is normal in size.  No pericardial effusion. Three vessel coronary atherosclerosis. Mediastinum/Nodes: No suspicious mediastinal lymphadenopathy. Visualized thyroid is unremarkable. Lungs/Pleura: Mild dependent atelectasis in the posterior upper and lower lobes. No focal consolidation. 5 x 3 mm perifissural nodule along the minor fissure (series 4/image 56), benign. No suspicious pulmonary nodules. No pleural effusion or pneumothorax. Musculoskeletal: Degenerative changes of the thoracic spine. Review of the MIP images confirms the above findings. CT ABDOMEN and PELVIS FINDINGS Hepatobiliary: Liver is within normal limits. Gallbladder is unremarkable. No intrahepatic or extrahepatic ductal dilatation. Pancreas: Within normal limits. Spleen: Within normal limits. Adrenals/Urinary Tract: Adrenal glands are within normal limits. Left kidney is within normal limits. 3.2 cm right lower pole renal cyst (series 5/image 46). No hydronephrosis. Bladder is underdistended but unremarkable. Stomach/Bowel: Stomach is within normal limits. No evidence of bowel obstruction. Normal appendix (series 5/image 56). Scattered mild left colonic diverticulosis, without evidence of diverticulitis. Vascular/Lymphatic: No evidence of abdominal aortic aneurysm. Atherosclerotic  calcifications of the abdominal aorta and branch vessels. No suspicious abdominopelvic lymphadenopathy. Reproductive: Status post prostatectomy. Other: No abdominopelvic ascites. Mild fat in the left inguinal canal. Musculoskeletal: Left hip arthroplasty, without evidence of complication. Degenerative changes of the lumbar spine. No focal osseous lesions. Review of the MIP images confirms the above findings. IMPRESSION: No evidence of pulmonary embolism. No evidence of acute cardiopulmonary disease. Status post prostatectomy. No evidence of recurrent or metastatic disease. Additional ancillary findings as above. Electronically Signed   By: Julian Hy M.D.   On: 06/06/2019 01:50   CT ABDOMEN PELVIS W CONTRAST  Result Date: 06/06/2019 CLINICAL DATA:  Shortness of breath. Prostate cancer, status post prostatectomy. Constipation. EXAM: CT ANGIOGRAPHY CHEST CT ABDOMEN AND PELVIS WITH CONTRAST TECHNIQUE: Multidetector CT imaging of the chest was performed using the standard protocol during bolus administration of intravenous contrast. Multiplanar CT image reconstructions and MIPs were obtained to evaluate the vascular anatomy. Multidetector CT imaging of the abdomen and pelvis was performed using the standard protocol during bolus administration of intravenous contrast. CONTRAST:  14mL OMNIPAQUE IOHEXOL  350 MG/ML SOLN COMPARISON:  CT abdomen/pelvis dated 05/11/2017 FINDINGS: CTA CHEST FINDINGS Cardiovascular: Satisfactory opacification the bilateral pulmonary arteries to the lobar level. No evidence of pulmonary embolism. Not tailored for evaluation of the thoracic aorta. No evidence of thoracic aortic aneurysm. Mild atherosclerotic calcifications of the aortic arch. The heart is normal in size.  No pericardial effusion. Three vessel coronary atherosclerosis. Mediastinum/Nodes: No suspicious mediastinal lymphadenopathy. Visualized thyroid is unremarkable. Lungs/Pleura: Mild dependent atelectasis in the  posterior upper and lower lobes. No focal consolidation. 5 x 3 mm perifissural nodule along the minor fissure (series 4/image 56), benign. No suspicious pulmonary nodules. No pleural effusion or pneumothorax. Musculoskeletal: Degenerative changes of the thoracic spine. Review of the MIP images confirms the above findings. CT ABDOMEN and PELVIS FINDINGS Hepatobiliary: Liver is within normal limits. Gallbladder is unremarkable. No intrahepatic or extrahepatic ductal dilatation. Pancreas: Within normal limits. Spleen: Within normal limits. Adrenals/Urinary Tract: Adrenal glands are within normal limits. Left kidney is within normal limits. 3.2 cm right lower pole renal cyst (series 5/image 46). No hydronephrosis. Bladder is underdistended but unremarkable. Stomach/Bowel: Stomach is within normal limits. No evidence of bowel obstruction. Normal appendix (series 5/image 56). Scattered mild left colonic diverticulosis, without evidence of diverticulitis. Vascular/Lymphatic: No evidence of abdominal aortic aneurysm. Atherosclerotic calcifications of the abdominal aorta and branch vessels. No suspicious abdominopelvic lymphadenopathy. Reproductive: Status post prostatectomy. Other: No abdominopelvic ascites. Mild fat in the left inguinal canal. Musculoskeletal: Left hip arthroplasty, without evidence of complication. Degenerative changes of the lumbar spine. No focal osseous lesions. Review of the MIP images confirms the above findings. IMPRESSION: No evidence of pulmonary embolism. No evidence of acute cardiopulmonary disease. Status post prostatectomy. No evidence of recurrent or metastatic disease. Additional ancillary findings as above. Electronically Signed   By: Julian Hy M.D.   On: 06/06/2019 01:50   DG Chest Portable 1 View  Result Date: 06/05/2019 CLINICAL DATA:  Chest pain. EXAM: PORTABLE CHEST 1 VIEW COMPARISON:  May 21, 2019 FINDINGS: The lung volumes are low. There is probable atelectasis at the  left lung base. There is no pneumothorax. No large pleural effusion. The heart size is stable from prior study. The there is no acute osseous abnormality. IMPRESSION: Low lung volumes with probable atelectasis at the left lung base. Electronically Signed   By: Constance Holster M.D.   On: 06/05/2019 22:15    Cardiac Studies   Prior catheterization at [facility] January 04, 2019 with most recent repeat procedure on May 26, 2018 showing in-stent restenosis of RCA stent which had progressed to CTO as well as CTO of circumflex with a patent LAD.  Patient Profile     Jacob Collier is a 64 y.o. male with known CAD, s/p LHC on 05-25-19 showing CTO of LCx as well as RCA, with no obst CAD in LAD, presents after having an episode of CP.  Assessment & Plan    1.  Multivessel CAD: We will try to obtain the angiographic studies which were done at [facility] for his most recent catheterization of May 25, 2019.  This apparently showed previous in-stent restenosis of his RCA progressing to CTO as well as CTO of the circumflex vessel with a patent LAD vessel supplying collaterals.  Troponins negative.  Patient has been on anti-ischemic regimen consisting of isorsorbide 30 mg in addition to DAPT therapy with aspirin/Plavix.  He also is on lisinopril.  ECG shows sinus bradycardia in the 50s.  I was going to add ranolazine  to his medical regimen but there is a questionable prior reaction to this.  Patient states may have made him worse.  Consider initiation of low-dose amlodipine, if unable to take ranolazine.  2.  COVID-19 infection: December 2020.  Patient had longstanding fatigability following his infection.  Most recent testing is negative.  3.  Hyperlipidemia: Now on atorvastatin 80 mg.  Will check lipid panel in a.m.  If LDL below 70, add Zetia.  4. Anxiety:  He has been on clonazepam at home.  Signed, Troy Sine, MD, [facility] 06/06/2019, 2:41 PM

## 2019-06-07 DIAGNOSIS — I25119 Atherosclerotic heart disease of native coronary artery with unspecified angina pectoris: Principal | ICD-10-CM

## 2019-06-07 MED ORDER — ATORVASTATIN CALCIUM 80 MG PO TABS: 80.0000 mg | ORAL_TABLET | Freq: Every day | ORAL | 0 refills | Status: DC

## 2019-06-07 MED ORDER — AMLODIPINE BESYLATE 5 MG PO TABS: 5.0000 mg | ORAL_TABLET | Freq: Every day | ORAL | 1 refills | Status: DC

## 2019-06-07 NOTE — Progress Notes (Signed)
Patient is discharged home with family, discharged instruction given to patient/family. Medications education done. Patient/family verbalizes understanding.

## 2019-06-07 NOTE — Discharge Summary (Signed)
Discharge Summary    Patient ID: Jacob Collier,  MRN: [id number], DOB/AGE: Mar 30, 1955 64 y.o.  Admit date: 06/05/2019 Discharge date: 06/07/2019  Primary Care Provider: [name] Primary Cardiologist: [name], MD  Discharge Diagnoses    Active Problems:   Chest pain   Coronary artery disease   Hyperlipidemia LDL goal <70   Anxiety   Allergies Allergies  Allergen Reactions  . Ranolazine     Other reaction(s): Other (See Comments) Chest discomfort/Reflux    Diagnostic Studies/Procedures    N/a  _____________   History of Present Illness     Jacob Collier has h/o CAD s/p PCI to prox-mid RCA 01-04-19, known CTO of LCx, s/p LHC on 05-25-19 showing ISR of RCA stent now progressed to CTO, as well as CTO of LCx (LAD patent), now presents w/ CP. He has no acute ischemic EKG changes and HS trop has been negative x 2 sets. After his LHC on the 20th, medical tx was recommended for his CAD given complexity of PCI; notes stated that if he had continued angina, CABG would be considered. Following the notes, it appears that patient has called in to his cardiologist multiple times over the past few days with various concerns. One note indicates he was  "worried that if he starts back and overexerts himself, he does not want to fall in the floor and die".  He had concerns about trial of medical tx vs intervention. He expressed having fatigue and loss of energy. He had misgivings about returning to work on 06-10-19. imdur 30mg  was added on 06-03-19.   Pt stated he called 911 the day of admission b/c he had an episode of rest-onset epigastric, L chest, and L arm pain. This was associated with nausea and several episodes of emesis, as well as SOB. These sx improved once he received zofran in the ED. It sounded like these sx were somewhat different from his prior angina. He had been pain-free since arrival in the ED several hours ago. He said he wishes to obtain a second opinion from  cardiology here regarding management of his CAD. He admits to "high anxiety". He is on clonazepam as OP for that.  Hospital Course     1.  Multivessel CAD: Most recent angiographic studies which were done at [facility] for his most recent catheterization of May 25, 2019 apparently showed previous in-stent restenosis of his RCA progressing to CTO as well as CTO of the circumflex vessel with a patent LAD vessel supplying collaterals.  Troponins negative.  Patient has been on anti-ischemic regimen consisting of isorsorbide 30 mg in addition to DAPT therapy with aspirin/Plavix.  He was also on lisinopril.  ECG showed sinus bradycardia in the 50s. Low dose amlodipine was added with improvement in symptoms.  -- his films were reviewed with Dr. [name] and Dr. Martinique who felt he was not a good candidate for CTO at this time. Will plan for ongoing medical therapy.   2.  COVID-19 infection: December 2020.  Patient had longstanding fatigability following his infection.  Most recent testing is negative.  3.  Hyperlipidemia: Now on atorvastatin 80 mg.  -- LFT/FLP in 8 weeks  4. Anxiety:  He has been on clonazepam at home.  General: Well developed, well nourished, male appearing in no acute distress. Head: Normocephalic, atraumatic.  Neck: Supple without bruits, JVD. Lungs:  Resp regular and unlabored, CTA. Heart: RRR, S1, S2, no S3, S4, or murmur; no rub. Abdomen:  Soft, non-tender, non-distended with normoactive bowel sounds. No hepatomegaly. No rebound/guarding. No obvious abdominal masses. Extremities: No clubbing, cyanosis, edema. Distal pedal pulses are 2+ bilaterally.  Neuro: Alert and oriented X 3. Moves all extremities spontaneously. Psych: Normal affect.  Jacob Collier was seen by Dr. [name] and determined stable for discharge home. Follow up in the office has been arranged. Medications are listed below.   _____________  Discharge Vitals Blood pressure 132/81, pulse 60,  temperature 98.4 F (36.9 C), temperature source Oral, resp. rate 16, height 5\' 11"  (1.803 m), weight 96.2 kg, SpO2 95 %.  Filed Weights   06/06/19 1053  Weight: 96.2 kg    Labs & Radiologic Studies    CBC Recent Labs    06/05/19 2231 06/06/19 0612  WBC 8.0 6.4  NEUTROABS 6.4  --   HGB 15.2 14.0  HCT 45.6 41.7  MCV 90.5 90.1  PLT 206 [id number]   Basic Metabolic Panel Recent Labs    06/05/19 2231 06/06/19 0612  NA 137 137  K 4.8 3.9  CL 105 105  CO2 23 19*  GLUCOSE 128* 103*  BUN 13 14  CREATININE 1.20 0.89  CALCIUM 9.1 8.9  MG 1.9  --    Liver Function Tests Recent Labs    06/05/19 2231  AST 22  ALT 41  ALKPHOS 75  BILITOT 0.9  PROT 6.9  ALBUMIN 3.7   Recent Labs    06/05/19 2231  LIPASE 29   Cardiac Enzymes No results for input(s): CKTOTAL, CKMB, CKMBINDEX, TROPONINI in the last 72 hours. BNP Invalid input(s): POCBNP D-Dimer No results for input(s): DDIMER in the last 72 hours. Hemoglobin A1C No results for input(s): HGBA1C in the last 72 hours. Fasting Lipid Panel Recent Labs    06/06/19 0612  CHOL 138  HDL 29*  LDLCALC 80  TRIG 143  CHOLHDL 4.8   Thyroid Function Tests No results for input(s): TSH, T4TOTAL, T3FREE, THYROIDAB in the last 72 hours.  Invalid input(s): FREET3 _____________  CT Angio Chest PE W and/or Wo Contrast  Result Date: 06/06/2019 CLINICAL DATA:  Shortness of breath. Prostate cancer, status post prostatectomy. Constipation. EXAM: CT ANGIOGRAPHY CHEST CT ABDOMEN AND PELVIS WITH CONTRAST TECHNIQUE: Multidetector CT imaging of the chest was performed using the standard protocol during bolus administration of intravenous contrast. Multiplanar CT image reconstructions and MIPs were obtained to evaluate the vascular anatomy. Multidetector CT imaging of the abdomen and pelvis was performed using the standard protocol during bolus administration of intravenous contrast. CONTRAST:  158mL OMNIPAQUE IOHEXOL 350 MG/ML SOLN COMPARISON:   CT abdomen/pelvis dated 05/11/2017 FINDINGS: CTA CHEST FINDINGS Cardiovascular: Satisfactory opacification the bilateral pulmonary arteries to the lobar level. No evidence of pulmonary embolism. Not tailored for evaluation of the thoracic aorta. No evidence of thoracic aortic aneurysm. Mild atherosclerotic calcifications of the aortic arch. The heart is normal in size.  No pericardial effusion. Three vessel coronary atherosclerosis. Mediastinum/Nodes: No suspicious mediastinal lymphadenopathy. Visualized thyroid is unremarkable. Lungs/Pleura: Mild dependent atelectasis in the posterior upper and lower lobes. No focal consolidation. 5 x 3 mm perifissural nodule along the minor fissure (series 4/image 56), benign. No suspicious pulmonary nodules. No pleural effusion or pneumothorax. Musculoskeletal: Degenerative changes of the thoracic spine. Review of the MIP images confirms the above findings. CT ABDOMEN and PELVIS FINDINGS Hepatobiliary: Liver is within normal limits. Gallbladder is unremarkable. No intrahepatic or extrahepatic ductal dilatation. Pancreas: Within normal limits. Spleen: Within normal limits. Adrenals/Urinary Tract: Adrenal glands are within normal limits. Left  kidney is within normal limits. 3.2 cm right lower pole renal cyst (series 5/image 46). No hydronephrosis. Bladder is underdistended but unremarkable. Stomach/Bowel: Stomach is within normal limits. No evidence of bowel obstruction. Normal appendix (series 5/image 56). Scattered mild left colonic diverticulosis, without evidence of diverticulitis. Vascular/Lymphatic: No evidence of abdominal aortic aneurysm. Atherosclerotic calcifications of the abdominal aorta and branch vessels. No suspicious abdominopelvic lymphadenopathy. Reproductive: Status post prostatectomy. Other: No abdominopelvic ascites. Mild fat in the left inguinal canal. Musculoskeletal: Left hip arthroplasty, without evidence of complication. Degenerative changes of the lumbar  spine. No focal osseous lesions. Review of the MIP images confirms the above findings. IMPRESSION: No evidence of pulmonary embolism. No evidence of acute cardiopulmonary disease. Status post prostatectomy. No evidence of recurrent or metastatic disease. Additional ancillary findings as above. Electronically Signed   By: Julian Hy M.D.   On: 06/06/2019 01:50   CT ABDOMEN PELVIS W CONTRAST  Result Date: 06/06/2019 CLINICAL DATA:  Shortness of breath. Prostate cancer, status post prostatectomy. Constipation. EXAM: CT ANGIOGRAPHY CHEST CT ABDOMEN AND PELVIS WITH CONTRAST TECHNIQUE: Multidetector CT imaging of the chest was performed using the standard protocol during bolus administration of intravenous contrast. Multiplanar CT image reconstructions and MIPs were obtained to evaluate the vascular anatomy. Multidetector CT imaging of the abdomen and pelvis was performed using the standard protocol during bolus administration of intravenous contrast. CONTRAST:  158mL OMNIPAQUE IOHEXOL 350 MG/ML SOLN COMPARISON:  CT abdomen/pelvis dated 05/11/2017 FINDINGS: CTA CHEST FINDINGS Cardiovascular: Satisfactory opacification the bilateral pulmonary arteries to the lobar level. No evidence of pulmonary embolism. Not tailored for evaluation of the thoracic aorta. No evidence of thoracic aortic aneurysm. Mild atherosclerotic calcifications of the aortic arch. The heart is normal in size.  No pericardial effusion. Three vessel coronary atherosclerosis. Mediastinum/Nodes: No suspicious mediastinal lymphadenopathy. Visualized thyroid is unremarkable. Lungs/Pleura: Mild dependent atelectasis in the posterior upper and lower lobes. No focal consolidation. 5 x 3 mm perifissural nodule along the minor fissure (series 4/image 56), benign. No suspicious pulmonary nodules. No pleural effusion or pneumothorax. Musculoskeletal: Degenerative changes of the thoracic spine. Review of the MIP images confirms the above findings. CT  ABDOMEN and PELVIS FINDINGS Hepatobiliary: Liver is within normal limits. Gallbladder is unremarkable. No intrahepatic or extrahepatic ductal dilatation. Pancreas: Within normal limits. Spleen: Within normal limits. Adrenals/Urinary Tract: Adrenal glands are within normal limits. Left kidney is within normal limits. 3.2 cm right lower pole renal cyst (series 5/image 46). No hydronephrosis. Bladder is underdistended but unremarkable. Stomach/Bowel: Stomach is within normal limits. No evidence of bowel obstruction. Normal appendix (series 5/image 56). Scattered mild left colonic diverticulosis, without evidence of diverticulitis. Vascular/Lymphatic: No evidence of abdominal aortic aneurysm. Atherosclerotic calcifications of the abdominal aorta and branch vessels. No suspicious abdominopelvic lymphadenopathy. Reproductive: Status post prostatectomy. Other: No abdominopelvic ascites. Mild fat in the left inguinal canal. Musculoskeletal: Left hip arthroplasty, without evidence of complication. Degenerative changes of the lumbar spine. No focal osseous lesions. Review of the MIP images confirms the above findings. IMPRESSION: No evidence of pulmonary embolism. No evidence of acute cardiopulmonary disease. Status post prostatectomy. No evidence of recurrent or metastatic disease. Additional ancillary findings as above. Electronically Signed   By: Julian Hy M.D.   On: 06/06/2019 01:50   DG Chest Portable 1 View  Result Date: 06/05/2019 CLINICAL DATA:  Chest pain. EXAM: PORTABLE CHEST 1 VIEW COMPARISON:  May 21, 2019 FINDINGS: The lung volumes are low. There is probable atelectasis at the left lung base. There is no pneumothorax.  No large pleural effusion. The heart size is stable from prior study. The there is no acute osseous abnormality. IMPRESSION: Low lung volumes with probable atelectasis at the left lung base. Electronically Signed   By: Constance Holster M.D.   On: 06/05/2019 22:15   Disposition    Pt is being discharged home today in good condition.  Follow-up Plans & Appointments    Follow-up Information    Troy Sine, MD Follow up on 06/25/2019.   Specialty: Cardiology Why: at 3pm for your follow up appt Contact information: [address] [place] [address] [phone]          Discharge Instructions    Diet - low sodium heart healthy   Complete by: As directed    Increase activity slowly   Complete by: As directed        Discharge Medications     Medication List    STOP taking these medications   Na Sulfate-K Sulfate-Mg Sulf 17.5-3.13-1.6 GM/177ML Soln     TAKE these medications   acetaminophen 500 MG tablet Commonly known as: TYLENOL Take 1,000 mg by mouth every 6 (six) hours as needed.   amLODipine 5 MG tablet Commonly known as: NORVASC Take 1 tablet (5 mg total) by mouth daily. Start taking on: June 08, 2019   aspirin 81 MG EC tablet Take by mouth.   atorvastatin 80 MG tablet Commonly known as: LIPITOR Take 1 tablet (80 mg total) by mouth daily at 6 PM. What changed:   medication strength  how much to take  when to take this   Brilinta 90 MG Tabs tablet Generic drug: ticagrelor Take 90 mg by mouth 2 (two) times daily.   clonazePAM 0.5 MG tablet Commonly known as: KLONOPIN Take by mouth.   eszopiclone 1 MG Tabs tablet Commonly known as: LUNESTA Take 2 mg by mouth at bedtime.   Fish Oil 1000 MG Caps Take 2,000 mg by mouth daily.   fluticasone 50 MCG/ACT nasal spray Commonly known as: FLONASE Place 1 spray into both nostrils daily.   isosorbide mononitrate 30 MG 24 hr tablet Commonly known as: IMDUR Take by mouth.   lisinopril 5 MG tablet Commonly known as: ZESTRIL Take 2.5 mg by mouth.       No                               Did the patient have a percutaneous coronary intervention (stent / angioplasty)?:  No.      Outstanding Labs/Studies   FLP/LFTs in 8 weeks.  Duration of Discharge  Encounter   Greater than 30 minutes including physician time.  Signed, Reino Bellis NP-C 06/07/2019, 1:04 PM   Patient seen and examined. Agree with assessment and plan.  Patient today feels well without recurrent anginal symptoms.  Amlodipine 5 mg was added to his isosorbide and lisinopril yesterday.  He also benefit from initiation of low-dose metoprolol succinate at 12.5 mg if heart rate and blood pressure allow.  I was finally able to obtain the images from his [facility] recent catheterization.  I personally reviewed the images and also personally reviewed the images with Dr. Martinique.  Patient has total proximal RCA occlusion without any significant peak or not been and has a long chronic occlusion.  There is retrograde filling of his PDA vessel from the LAD system.  He also has occluded circumflex disease in his marginal branch.  It was difficult to visualize the LAD in several views 1 view suggest there is no significant obstructive disease although perhaps mild plaque in the mid LAD.  At present recommend more aggressive medical therapy trial.  In the past he states Ranexa did not help him.  I suspect he never was titrated to 1000 mg twice a day.  I will not start this presently.  The patient expressed his desire that I assume his cardiology care.  Will be following up with Dr. Maia Petties in [place] next week and will inform him.  For discharge today.  We will try to arrange follow-up office visit with me in 3 to 4 weeks as my schedule allows.   Troy Sine, MD, [facility] 06/07/2019 1:04 PM

## 2019-06-08 NOTE — Progress Notes (Signed)
RKG normal lp

## 2019-06-11 ENCOUNTER — Encounter (HOSPITAL_COMMUNITY): Payer: Self-pay | Admitting: *Deleted

## 2019-06-11 ENCOUNTER — Other Ambulatory Visit: Payer: Self-pay

## 2019-06-11 ENCOUNTER — Emergency Department (HOSPITAL_COMMUNITY): Payer: Managed Care, Other (non HMO)

## 2019-06-11 ENCOUNTER — Emergency Department (HOSPITAL_COMMUNITY)
Admission: EM | Admit: 2019-06-11 | Discharge: 2019-06-11 | Disposition: A | Discharge status: Home / Self Care | Payer: Managed Care, Other (non HMO) | Attending: Emergency Medicine | Admitting: Emergency Medicine

## 2019-06-11 ENCOUNTER — Other Ambulatory Visit: Payer: Self-pay | Admitting: Cardiology

## 2019-06-11 DIAGNOSIS — Z7982 Long term (current) use of aspirin: Secondary | ICD-10-CM | POA: Insufficient documentation

## 2019-06-11 DIAGNOSIS — R079 Chest pain, unspecified: Secondary | ICD-10-CM | POA: Diagnosis present

## 2019-06-11 DIAGNOSIS — I471 Supraventricular tachycardia, unspecified: Secondary | ICD-10-CM

## 2019-06-11 DIAGNOSIS — Z8546 Personal history of malignant neoplasm of prostate: Secondary | ICD-10-CM | POA: Diagnosis not present

## 2019-06-11 DIAGNOSIS — Z96642 Presence of left artificial hip joint: Secondary | ICD-10-CM | POA: Insufficient documentation

## 2019-06-11 DIAGNOSIS — I10 Essential (primary) hypertension: Secondary | ICD-10-CM | POA: Diagnosis not present

## 2019-06-11 DIAGNOSIS — Z955 Presence of coronary angioplasty implant and graft: Secondary | ICD-10-CM | POA: Insufficient documentation

## 2019-06-11 DIAGNOSIS — R002 Palpitations: Secondary | ICD-10-CM

## 2019-06-11 DIAGNOSIS — I2510 Atherosclerotic heart disease of native coronary artery without angina pectoris: Secondary | ICD-10-CM | POA: Insufficient documentation

## 2019-06-11 DIAGNOSIS — Z79899 Other long term (current) drug therapy: Secondary | ICD-10-CM | POA: Insufficient documentation

## 2019-06-11 HISTORY — DX: Atherosclerotic heart disease of native coronary artery without angina pectoris: I25.10

## 2019-06-11 LAB — CBC WITH DIFFERENTIAL/PLATELET
Abs Immature Granulocytes: 0.02 10*3/uL (ref 0.00–0.07)
Basophils Absolute: 0.1 10*3/uL (ref 0.0–0.1)
Basophils Relative: 1 %
Eosinophils Absolute: 0.2 10*3/uL (ref 0.0–0.5)
Eosinophils Relative: 3 %
HCT: 45.9 % (ref 39.0–52.0)
Hemoglobin: 15.5 g/dL (ref 13.0–17.0)
Immature Granulocytes: 0 %
Lymphocytes Relative: 25 %
Lymphs Abs: 1.7 10*3/uL (ref 0.7–4.0)
MCH: 30.9 pg (ref 26.0–34.0)
MCHC: 33.8 g/dL (ref 30.0–36.0)
MCV: 91.4 fL (ref 80.0–100.0)
Monocytes Absolute: 0.4 10*3/uL (ref 0.1–1.0)
Monocytes Relative: 6 %
Neutro Abs: 4.5 10*3/uL (ref 1.7–7.7)
Neutrophils Relative %: 65 %
Platelets: 219 10*3/uL (ref 150–400)
RBC: 5.02 MIL/uL (ref 4.22–5.81)
RDW: 14.9 % (ref 11.5–15.5)
WBC: 6.9 10*3/uL (ref 4.0–10.5)
nRBC: 0 % (ref 0.0–0.2)

## 2019-06-11 LAB — COMPREHENSIVE METABOLIC PANEL
ALT: 34 U/L (ref 0–44)
AST: 25 U/L (ref 15–41)
Albumin: 3.9 g/dL (ref 3.5–5.0)
Alkaline Phosphatase: 84 U/L (ref 38–126)
Anion gap: 12 (ref 5–15)
BUN: 15 mg/dL (ref 8–23)
CO2: 20 mmol/L — ABNORMAL LOW (ref 22–32)
Calcium: 9.2 mg/dL (ref 8.9–10.3)
Chloride: 106 mmol/L (ref 98–111)
Creatinine, Ser: 1.08 mg/dL (ref 0.61–1.24)
GFR calc Af Amer: >60 mL/min (ref >60)
GFR calc non Af Amer: >60 mL/min (ref >60)
Glucose, Bld: 113 mg/dL — ABNORMAL HIGH (ref 70–99)
Potassium: 4 mmol/L (ref 3.5–5.1)
Sodium: 138 mmol/L (ref 135–145)
Total Bilirubin: 1.3 mg/dL — ABNORMAL HIGH (ref 0.3–1.2)
Total Protein: 7.3 g/dL (ref 6.5–8.1)

## 2019-06-11 LAB — TROPONIN I (HIGH SENSITIVITY): Troponin I (High Sensitivity): 10 ng/L (ref <18)

## 2019-06-11 MED ORDER — ASPIRIN 81 MG PO CHEW: 324.0000 mg | CHEWABLE_TABLET | Freq: Once | ORAL | Status: DC

## 2019-06-11 MED ORDER — METOPROLOL SUCCINATE ER 25 MG PO TB24: 25.0000 mg | ORAL_TABLET | Freq: Every day | ORAL | 0 refills | Status: DC

## 2019-06-11 MED ORDER — CLONAZEPAM 0.5 MG PO TABS
0.5000 mg | ORAL_TABLET | Freq: Once | ORAL | Status: AC
  Administered 2019-06-11: 15:00:00 0.5 mg via ORAL
  Filled 2019-06-11: qty 1

## 2019-06-11 MED ORDER — HEPARIN BOLUS VIA INFUSION
4000.0000 [IU] | Freq: Once | INTRAVENOUS | Status: AC
  Administered 2019-06-11: 4000 [IU] via INTRAVENOUS
  Filled 2019-06-11: qty 4000

## 2019-06-11 MED ORDER — METOPROLOL SUCCINATE ER 25 MG PO TB24
25.0000 mg | ORAL_TABLET | Freq: Every day | ORAL | Status: DC
  Administered 2019-06-11: 25 mg via ORAL
  Filled 2019-06-11: qty 1

## 2019-06-11 MED ORDER — NITROGLYCERIN 0.4 MG SL SUBL: 0.4000 mg | SUBLINGUAL_TABLET | SUBLINGUAL | Status: DC | PRN

## 2019-06-11 MED ORDER — HEPARIN (PORCINE) 25000 UT/250ML-% IV SOLN
1200.0000 [IU]/h | INTRAVENOUS | Status: DC
  Administered 2019-06-11: 1200 [IU]/h via INTRAVENOUS
  Filled 2019-06-11: qty 250
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
## 2019-06-11 NOTE — Discharge Instructions (Signed)
Please fill your prescription for metoprolol, take this daily, this will help to keep your heart rate slow.  The cardiology team is recommended that you can follow-up in the clinic.  Please seek medical exam for severe or worsening symptoms including increasing chest pain shortness of breath or ongoing fast heart rate
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
## 2019-06-11 NOTE — ED Triage Notes (Signed)
Pt arrived via ems.  Called out with neck tightness and EMS reported HR in the 150-160s.  Pt was recently here with cardiac concerns and had 2 stents reoccluded.  Dr. [name] decided to do medical management about 1 week ago.  Pt arrives here with chest and neck tightness with radiation to left shoulder.  Pt now HR in the 80s.

## 2019-06-12 ENCOUNTER — Telehealth: Payer: Self-pay | Admitting: Cardiovascular Disease

## 2019-06-12 ENCOUNTER — Telehealth: Payer: Self-pay | Admitting: *Deleted

## 2019-06-12 MED ORDER — METOPROLOL SUCCINATE ER 25 MG PO TB24: 25.0000 mg | ORAL_TABLET | Freq: Every day | ORAL | 0 refills | Status: DC

## 2019-06-12 NOTE — Telephone Encounter (Signed)
Patient enrolled for [employer] to ship a 30 day cardiac event monitor to his home.  Instructions will be included in the monitor kit. 

## 2019-06-12 NOTE — Telephone Encounter (Signed)
I spoke with [facility] in [place]. They did not receive prescription sent yesterday.  Prescription resent. Pt notified. I confirmed with pharmacy they received prescription

## 2019-06-12 NOTE — Telephone Encounter (Signed)
New Message  Pt c/o medication issue:     1. Name of Medication:     2. How are you currently taking this medication (dosage and times per day)?  3. Are you having a reaction (difficulty breathing--STAT)? No  4. What is your medication issue? Needs a prescription to receive medication
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
## 2019-06-14 ENCOUNTER — Other Ambulatory Visit: Payer: Self-pay

## 2019-06-14 ENCOUNTER — Encounter: Payer: Self-pay | Admitting: Student

## 2019-06-14 ENCOUNTER — Ambulatory Visit (INDEPENDENT_AMBULATORY_CARE_PROVIDER_SITE_OTHER): Payer: Managed Care, Other (non HMO) | Admitting: Student

## 2019-06-14 VITALS — BP 132/78 | Temp 97.1°F | Ht 71.0 in | Wt 213.0 lb

## 2019-06-14 DIAGNOSIS — I471 Supraventricular tachycardia, unspecified: Secondary | ICD-10-CM

## 2019-06-14 DIAGNOSIS — E785 Hyperlipidemia, unspecified: Secondary | ICD-10-CM | POA: Diagnosis not present

## 2019-06-14 DIAGNOSIS — I10 Essential (primary) hypertension: Secondary | ICD-10-CM

## 2019-06-14 DIAGNOSIS — I25119 Atherosclerotic heart disease of native coronary artery with unspecified angina pectoris: Secondary | ICD-10-CM | POA: Diagnosis not present

## 2019-06-14 DIAGNOSIS — F419 Anxiety disorder, unspecified: Secondary | ICD-10-CM

## 2019-06-14 NOTE — Patient Instructions (Signed)
Medication Instructions:  Your physician recommends that you continue on your current medications as directed. Please refer to the Current Medication list given to you today.  *If you need a refill on your cardiac medications before your next appointment, please call your pharmacy*   Follow-Up: At [facility], you and your health needs are our priority.  As part of our continuing mission to provide you with exceptional heart care, we have created designated Provider Care Teams.  These Care Teams include your primary Cardiologist (physician) and Advanced Practice Providers (APPs -  Physician Assistants and Nurse Practitioners) who all work together to provide you with the care you need, when you need it.  We recommend signing up for the patient portal called "MyChart".  Sign up information is provided on this After Visit Summary.  MyChart is used to connect with patients for Virtual Visits (Telemedicine).  Patients are able to view lab/test results, encounter notes, upcoming appointments, etc.  Non-urgent messages can be sent to your provider as well.   To learn more about what you can do with MyChart, go to [email or web address].    Your next appointment:   Please keep your follow-up appointment with Dr. [name] on April 20th.   Other Instructions  Slowly increase exercise.   Heart-Healthy Eating Plan Heart-healthy meal planning includes:  Eating less unhealthy fats.  Eating more healthy fats.  Making other changes in your diet. Talk with your doctor or a diet specialist (dietitian) to create an eating plan that is right for you. What is my plan? Your doctor may recommend an eating plan that includes:  Total fat: ______% or less of total calories a day.  Saturated fat: ______% or less of total calories a day.  Cholesterol: less than _________mg a day. What are tips for following this plan? Cooking Avoid frying your food. Try to bake, boil, grill, or broil it instead. You  can also reduce fat by:  Removing the skin from poultry.  Removing all visible fats from meats.  Steaming vegetables in water or broth. Meal planning   At meals, divide your plate into four equal parts: ? Fill one-half of your plate with vegetables and green salads. ? Fill one-fourth of your plate with whole grains. ? Fill one-fourth of your plate with lean protein foods.  Eat 4-5 servings of vegetables per day. A serving of vegetables is: ? 1 cup of raw or cooked vegetables. ? 2 cups of raw leafy greens.  Eat 4-5 servings of fruit per day. A serving of fruit is: ? 1 medium whole fruit. ?  cup of dried fruit. ?  cup of fresh, frozen, or canned fruit. ?  cup of 100% fruit juice.  Eat more foods that have soluble fiber. These are apples, broccoli, carrots, beans, peas, and barley. Try to get 20-30 g of fiber per day.  Eat 4-5 servings of nuts, legumes, and seeds per week: ? 1 serving of dried beans or legumes equals  cup after being cooked. ? 1 serving of nuts is  cup. ? 1 serving of seeds equals 1 tablespoon. General information  Eat more home-cooked food. Eat less restaurant, buffet, and fast food.  Limit or avoid alcohol.  Limit foods that are high in starch and sugar.  Avoid fried foods.  Lose weight if you are overweight.  Keep track of how much salt (sodium) you eat. This is important if you have high blood pressure. Ask your doctor to tell you more about this.  Try to  add vegetarian meals each week. Fats  Choose healthy fats. These include olive oil and canola oil, flaxseeds, walnuts, almonds, and seeds.  Eat more omega-3 fats. These include salmon, mackerel, sardines, tuna, flaxseed oil, and ground flaxseeds. Try to eat fish at least 2 times each week.  Check food labels. Avoid foods with trans fats or high amounts of saturated fat.  Limit saturated fats. ? These are often found in animal products, such as meats, butter, and cream. ? These are also  found in plant foods, such as palm oil, palm kernel oil, and coconut oil.  Avoid foods with partially hydrogenated oils in them. These have trans fats. Examples are stick margarine, some tub margarines, cookies, crackers, and other baked goods. What foods can I eat? Fruits All fresh, canned (in natural juice), or frozen fruits. Vegetables Fresh or frozen vegetables (raw, steamed, roasted, or grilled). Green salads. Grains Most grains. Choose whole wheat and whole grains most of the time. Rice and pasta, including brown rice and pastas made with whole wheat. Meats and other proteins Lean, well-trimmed beef, veal, pork, and lamb. Chicken and [place] without skin. All fish and shellfish. Wild duck, rabbit, pheasant, and venison. Egg whites or low-cholesterol egg substitutes. Dried beans, peas, lentils, and tofu. Seeds and most nuts. Dairy Low-fat or nonfat cheeses, including ricotta and mozzarella. Skim or 1% milk that is liquid, powdered, or evaporated. Buttermilk that is made with low-fat milk. Nonfat or low-fat yogurt. Fats and oils Non-hydrogenated (trans-free) margarines. Vegetable oils, including soybean, sesame, sunflower, olive, peanut, safflower, corn, canola, and cottonseed. Salad dressings or mayonnaise made with a vegetable oil. Beverages Mineral water. Coffee and tea. Diet carbonated beverages. Sweets and desserts Sherbet, gelatin, and fruit ice. Small amounts of dark chocolate. Limit all sweets and desserts. Seasonings and condiments All seasonings and condiments. The items listed above may not be a complete list of foods and drinks you can eat. Contact a dietitian for more options. What foods should I avoid? Fruits Canned fruit in heavy syrup. Fruit in cream or butter sauce. Fried fruit. Limit coconut. Vegetables Vegetables cooked in cheese, cream, or butter sauce. Fried vegetables. Grains Breads that are made with saturated or trans fats, oils, or whole milk. Croissants.  Sweet rolls. Donuts. High-fat crackers, such as cheese crackers. Meats and other proteins Fatty meats, such as hot dogs, ribs, sausage, bacon, rib-eye roast or steak. High-fat deli meats, such as salami and bologna. Caviar. Domestic duck and goose. Organ meats, such as liver. Dairy Cream, sour cream, cream cheese, and creamed cottage cheese. Whole-milk cheeses. Whole or 2% milk that is liquid, evaporated, or condensed. Whole buttermilk. Cream sauce or high-fat cheese sauce. Yogurt that is made from whole milk. Fats and oils Meat fat, or shortening. Cocoa butter, hydrogenated oils, palm oil, coconut oil, palm kernel oil. Solid fats and shortenings, including bacon fat, salt pork, lard, and butter. Nondairy cream substitutes. Salad dressings with cheese or sour cream. Beverages Regular sodas and juice drinks with added sugar. Sweets and desserts Frosting. Pudding. Cookies. Cakes. Pies. Milk chocolate or white chocolate. Buttered syrups. Full-fat ice cream or ice cream drinks. The items listed above may not be a complete list of foods and drinks to avoid. Contact a dietitian for more information. Summary  Heart-healthy meal planning includes eating less unhealthy fats, eating more healthy fats, and making other changes in your diet.  Eat a balanced diet. This includes fruits and vegetables, low-fat or nonfat dairy, lean protein, nuts and legumes, whole grains, and  heart-healthy oils and fats. This information is not intended to replace advice given to you by your health care provider. Make sure you discuss any questions you have with your health care provider. Document Revised: 04/27/2017 Document Reviewed: 03/31/2017 Elsevier Patient Education  2020 [employer].

## 2019-06-25 ENCOUNTER — Ambulatory Visit (INDEPENDENT_AMBULATORY_CARE_PROVIDER_SITE_OTHER): Payer: Managed Care, Other (non HMO) | Admitting: Cardiovascular Disease

## 2019-06-25 ENCOUNTER — Other Ambulatory Visit: Payer: Self-pay

## 2019-06-25 ENCOUNTER — Encounter: Payer: Self-pay | Admitting: Cardiovascular Disease

## 2019-06-25 DIAGNOSIS — I25119 Atherosclerotic heart disease of native coronary artery with unspecified angina pectoris: Secondary | ICD-10-CM

## 2019-06-25 DIAGNOSIS — E785 Hyperlipidemia, unspecified: Secondary | ICD-10-CM | POA: Diagnosis not present

## 2019-06-25 DIAGNOSIS — Z8546 Personal history of malignant neoplasm of prostate: Secondary | ICD-10-CM

## 2019-06-25 DIAGNOSIS — I10 Essential (primary) hypertension: Secondary | ICD-10-CM

## 2019-06-25 DIAGNOSIS — I471 Supraventricular tachycardia, unspecified: Secondary | ICD-10-CM

## 2019-06-25 DIAGNOSIS — I2510 Atherosclerotic heart disease of native coronary artery without angina pectoris: Secondary | ICD-10-CM

## 2019-06-25 MED ORDER — LOSARTAN POTASSIUM 25 MG PO TABS: 25.0000 mg | ORAL_TABLET | Freq: Every day | ORAL | 3 refills | Status: DC

## 2019-06-25 MED ORDER — ATORVASTATIN CALCIUM 20 MG PO TABS: 20.0000 mg | ORAL_TABLET | Freq: Every day | ORAL | 3 refills | Status: DC

## 2019-06-25 MED ORDER — EZETIMIBE 10 MG PO TABS: 10.0000 mg | ORAL_TABLET | Freq: Every day | ORAL | 3 refills | Status: DC

## 2019-06-25 NOTE — Patient Instructions (Signed)
Medication Instructions:  STOP TAKING YOUR LISINOPRIL   DECREASE YOUR ATORVASTATIN TO 20MG  DAILY  BEGIN TAKING ZETIA 10MG  DAILY   BEGIN TAKING LOSARTAN 25MG  DAILY  *If you need a refill on your cardiac medications before your next appointment, please call your pharmacy*   Lab Work: 3 MONTHS- FASTING LABS: CMET LIPID  If you have labs (blood work) drawn today and your tests are completely normal, you will receive your results only by: [name] MyChart Message (if you have MyChart) OR . A paper copy in the mail If you have any lab test that is abnormal or we need to change your treatment, we will call you to review the results.  Follow-Up: At [facility], you and your health needs are our priority.  As part of our continuing mission to provide you with exceptional heart care, we have created designated Provider Care Teams.  These Care Teams include your primary Cardiologist (physician) and Advanced Practice Providers (APPs -  Physician Assistants and Nurse Practitioners) who all work together to provide you with the care you need, when you need it.  We recommend signing up for the patient portal called "MyChart".  Sign up information is provided on this After Visit Summary.  MyChart is used to connect with patients for Virtual Visits (Telemedicine).  Patients are able to view lab/test results, encounter notes, upcoming appointments, etc.  Non-urgent messages can be sent to your provider as well.   To learn more about what you can do with MyChart, go to [email or web address].    Your next appointment:   4 month(s)  The format for your next appointment:   In Person  Provider:   [name], MD

## 2019-06-27 ENCOUNTER — Ambulatory Visit: Payer: Medicare Other | Admitting: Legal Medicine

## 2019-06-27 ENCOUNTER — Other Ambulatory Visit: Payer: Self-pay

## 2019-06-27 ENCOUNTER — Ambulatory Visit (INDEPENDENT_AMBULATORY_CARE_PROVIDER_SITE_OTHER): Payer: Managed Care, Other (non HMO) | Admitting: Legal Medicine

## 2019-06-27 ENCOUNTER — Encounter: Payer: Self-pay | Admitting: Legal Medicine

## 2019-06-27 VITALS — BP 140/90 | HR 71 | Temp 98.0°F | Ht 71.0 in | Wt 220.8 lb

## 2019-06-27 DIAGNOSIS — M545 Low back pain, unspecified: Secondary | ICD-10-CM

## 2019-06-27 DIAGNOSIS — G8929 Other chronic pain: Secondary | ICD-10-CM

## 2019-06-27 DIAGNOSIS — M5432 Sciatica, left side: Secondary | ICD-10-CM | POA: Diagnosis not present

## 2019-06-27 MED ORDER — TRIAMCINOLONE ACETONIDE 40 MG/ML IJ SUSP
80.0000 mg | Freq: Once | INTRAMUSCULAR | Status: AC
  Administered 2019-06-27: 80 mg via INTRAMUSCULAR
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
## 2019-06-27 NOTE — Assessment & Plan Note (Signed)
Pain in left hip , it is probably form back.  He has normal sensation.  Injection Kenalog and xylocaine given in trigger point left side

## 2019-06-29 ENCOUNTER — Other Ambulatory Visit: Payer: Self-pay | Admitting: Cardiology

## 2019-07-01 ENCOUNTER — Telehealth: Payer: Self-pay | Admitting: Student

## 2019-07-01 NOTE — Telephone Encounter (Signed)
   Pt is looking for Jacob Collier, he said he was told by Dr. [name], Annamary Carolin will be helping with his short term disability, the insurance requires updated medical records  Please advise

## 2019-07-01 NOTE — Telephone Encounter (Signed)
Spoke with patient and he is requesting forms be filled out for FMLA to be extended through at least 5-15, discussed with Dr [name] at last ov Per patient Jacob Collier should have sent papers but has number if they did not Incident # [id number] phone # [phone] Eddie Dibbles  Will forward to Dr Evette Georges RN

## 2019-07-02 ENCOUNTER — Encounter: Payer: Self-pay | Admitting: Cardiovascular Disease
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
## 2019-07-05 ENCOUNTER — Encounter: Payer: Self-pay | Admitting: Legal Medicine

## 2019-07-05 ENCOUNTER — Telehealth (INDEPENDENT_AMBULATORY_CARE_PROVIDER_SITE_OTHER): Payer: Managed Care, Other (non HMO) | Admitting: Legal Medicine

## 2019-07-05 DIAGNOSIS — F5101 Primary insomnia: Secondary | ICD-10-CM | POA: Diagnosis not present

## 2019-07-05 MED ORDER — ESZOPICLONE 3 MG PO TABS: 3.0000 mg | ORAL_TABLET | Freq: Every day | ORAL | 2 refills | Status: DC
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
## 2019-07-07 NOTE — Assessment & Plan Note (Signed)
Patient is under severe stress with job and cardiac issues.  He requires a good sleep to cope with the situation.  Lunesta called in

## 2019-07-08 ENCOUNTER — Other Ambulatory Visit: Payer: Self-pay

## 2019-07-08 ENCOUNTER — Telehealth: Payer: Self-pay | Admitting: Cardiovascular Disease

## 2019-07-08 ENCOUNTER — Encounter: Payer: Self-pay | Admitting: Student

## 2019-07-08 ENCOUNTER — Ambulatory Visit (INDEPENDENT_AMBULATORY_CARE_PROVIDER_SITE_OTHER): Payer: Managed Care, Other (non HMO) | Admitting: Student

## 2019-07-08 VITALS — BP 126/72 | HR 70 | Temp 98.1°F | Ht 71.0 in | Wt 213.0 lb

## 2019-07-08 DIAGNOSIS — I2510 Atherosclerotic heart disease of native coronary artery without angina pectoris: Secondary | ICD-10-CM | POA: Diagnosis not present

## 2019-07-08 DIAGNOSIS — R001 Bradycardia, unspecified: Secondary | ICD-10-CM | POA: Diagnosis not present

## 2019-07-08 DIAGNOSIS — E785 Hyperlipidemia, unspecified: Secondary | ICD-10-CM

## 2019-07-08 DIAGNOSIS — I471 Supraventricular tachycardia, unspecified: Secondary | ICD-10-CM

## 2019-07-08 DIAGNOSIS — I10 Essential (primary) hypertension: Secondary | ICD-10-CM

## 2019-07-08 MED ORDER — METOPROLOL SUCCINATE ER 25 MG PO TB24: 12.5000 mg | ORAL_TABLET | Freq: Every day | ORAL | 3 refills | Status: DC

## 2019-07-08 MED ORDER — AMLODIPINE BESYLATE 2.5 MG PO TABS: 2.5000 mg | ORAL_TABLET | Freq: Every day | ORAL | 3 refills | Status: DC

## 2019-07-08 MED ORDER — PANTOPRAZOLE SODIUM 40 MG PO TBEC: 40.0000 mg | DELAYED_RELEASE_TABLET | Freq: Every day | ORAL | 11 refills | Status: DC

## 2019-07-08 NOTE — Telephone Encounter (Signed)
Spoke with patient and heart rate 40's-50's with more skipped beats Patient will keep visit today and be seen in office instead of virtual

## 2019-07-08 NOTE — Telephone Encounter (Signed)
Pt c/o medication issue:  1. Name of Medication: all of them  2. How are you currently taking this medication (dosage and times per day)?  As directed   3. Are you having a reaction (difficulty breathing--STAT)? no  4. What is your medication issue?  Pt has spells where his BP and pulse drop low. He is not sure which medication is causing the issue
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
## 2019-07-08 NOTE — Patient Instructions (Signed)
Medication Instructions:  Decrease Amlodipine to 2.5 mg daily Decrease Metoprolol to 12.5 mg at night Start Protonix 40 mg daily Try drinking caffeine prior to taking Brilinta  *If you need a refill on your cardiac medications before your next appointment, please call your pharmacy*   Lab Work: None   Testing/Procedures: None   Follow-Up: At [facility], you and your health needs are our priority.  As part of our continuing mission to provide you with exceptional heart care, we have created designated Provider Care Teams.  These Care Teams include your primary Cardiologist (physician) and Advanced Practice Providers (APPs -  Physician Assistants and Nurse Practitioners) who all work together to provide you with the care you need, when you need it.  We recommend signing up for the patient portal called "MyChart".  Sign up information is provided on this After Visit Summary.  MyChart is used to connect with patients for Virtual Visits (Telemedicine).  Patients are able to view lab/test results, encounter notes, upcoming appointments, etc.  Non-urgent messages can be sent to your provider as well.   To learn more about what you can do with MyChart, go to [email or web address].    Your next appointment:   1 month(s)  The format for your next appointment:   In Person  Provider:   Sande Rives, [place]

## 2019-07-15 ENCOUNTER — Ambulatory Visit (INDEPENDENT_AMBULATORY_CARE_PROVIDER_SITE_OTHER): Payer: Managed Care, Other (non HMO)

## 2019-07-15 DIAGNOSIS — R002 Palpitations: Secondary | ICD-10-CM

## 2019-07-16 ENCOUNTER — Other Ambulatory Visit: Payer: Self-pay | Admitting: Legal Medicine

## 2019-07-19 ENCOUNTER — Encounter: Payer: Self-pay | Admitting: Legal Medicine

## 2019-07-19 ENCOUNTER — Other Ambulatory Visit: Payer: Self-pay

## 2019-07-19 ENCOUNTER — Ambulatory Visit (INDEPENDENT_AMBULATORY_CARE_PROVIDER_SITE_OTHER): Payer: Managed Care, Other (non HMO) | Admitting: Legal Medicine

## 2019-07-19 VITALS — BP 140/66 | HR 64 | Temp 98.0°F | Resp 17 | Ht 71.0 in | Wt 217.0 lb

## 2019-07-19 DIAGNOSIS — I2510 Atherosclerotic heart disease of native coronary artery without angina pectoris: Secondary | ICD-10-CM
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
## 2019-07-21 NOTE — Assessment & Plan Note (Addendum)
Patient is stable and ready to RTW.  He continues seeing Dr. [name], cardiologist, he was givien a note to RTW.

## 2019-07-30 ENCOUNTER — Telehealth: Payer: Self-pay

## 2019-07-30 NOTE — Telephone Encounter (Signed)
Incoming [employer] monitor- 07/29/19 at 1:31pm- sinus rhythm, SVT (36 seconds) w/PACs And 07/30/19 at 7:57 am- sinus rhythm w/bigeminal PVCs/PVCs (35 in 1 min)/Artifiact.  Reviewed by [facility]- recommends increasing the pt's Toprol to 25mg  daily.   Called pt's cellphone and left vm to call back, called home number listed on DPR and left vm to call back as well.

## 2019-08-01 MED ORDER — METOPROLOL SUCCINATE ER 25 MG PO TB24
25.0000 mg | ORAL_TABLET | Freq: Every day | ORAL | 3 refills | Status: DC
Start: 2019-08-01 — End: 2020-06-12
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
## 2019-08-01 NOTE — Telephone Encounter (Signed)
Called patient back. Informed patient about his monitor on 07/29/19. Patient stated he was feeling really bad that day. After he left work he made sure he was hydrated and in the air condition and felt better. Informed patient that Dr. [name] wanted him to increase his metoprolol to 25 mg. Patient stated he would do that and make sure he puts back on his monitor this evening.

## 2019-08-01 NOTE — Telephone Encounter (Signed)
Patient returning call.

## 2019-08-06 ENCOUNTER — Ambulatory Visit (INDEPENDENT_AMBULATORY_CARE_PROVIDER_SITE_OTHER): Payer: Managed Care, Other (non HMO) | Admitting: Legal Medicine

## 2019-08-06 ENCOUNTER — Other Ambulatory Visit: Payer: Self-pay

## 2019-08-06 ENCOUNTER — Encounter: Payer: Self-pay | Admitting: Legal Medicine

## 2019-08-06 VITALS — BP 138/70 | HR 78 | Temp 98.2°F | Resp 17 | Ht 71.0 in | Wt 224.6 lb

## 2019-08-06 DIAGNOSIS — G8929 Other chronic pain: Secondary | ICD-10-CM

## 2019-08-06 DIAGNOSIS — M545 Low back pain: Secondary | ICD-10-CM | POA: Diagnosis not present

## 2019-08-06 DIAGNOSIS — I25110 Atherosclerotic heart disease of native coronary artery with unstable angina pectoris: Secondary | ICD-10-CM | POA: Diagnosis not present

## 2019-08-06 MED ORDER — HYDROCODONE-ACETAMINOPHEN 10-325 MG PO TABS: 1.0000 | ORAL_TABLET | Freq: Two times a day (BID) | ORAL | 0 refills | Status: AC

## 2019-08-06 MED ORDER — CLONAZEPAM 0.5 MG PO TABS: 0.5000 mg | ORAL_TABLET | Freq: Two times a day (BID) | ORAL | 3 refills | Status: DC
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
## 2019-08-08 ENCOUNTER — Encounter: Payer: Self-pay | Admitting: Student

## 2019-08-08 ENCOUNTER — Ambulatory Visit (INDEPENDENT_AMBULATORY_CARE_PROVIDER_SITE_OTHER): Payer: Managed Care, Other (non HMO) | Admitting: Student

## 2019-08-08 ENCOUNTER — Other Ambulatory Visit: Payer: Self-pay

## 2019-08-08 VITALS — BP 142/80 | HR 77 | Ht 71.0 in | Wt 225.2 lb

## 2019-08-08 DIAGNOSIS — R001 Bradycardia, unspecified: Secondary | ICD-10-CM

## 2019-08-08 DIAGNOSIS — I471 Supraventricular tachycardia, unspecified: Secondary | ICD-10-CM

## 2019-08-08 DIAGNOSIS — I10 Essential (primary) hypertension: Secondary | ICD-10-CM

## 2019-08-08 DIAGNOSIS — I2510 Atherosclerotic heart disease of native coronary artery without angina pectoris: Secondary | ICD-10-CM

## 2019-08-08 DIAGNOSIS — E785 Hyperlipidemia, unspecified: Secondary | ICD-10-CM

## 2019-08-08 NOTE — Patient Instructions (Signed)
Medication Instructions:  Your physician recommends that you continue on your current medications as directed. Please refer to the Current Medication list given to you today.  *If you need a refill on your cardiac medications before your next appointment, please call your pharmacy*   Follow-Up: At [facility], you and your health needs are our priority.  As part of our continuing mission to provide you with exceptional heart care, we have created designated Provider Care Teams.  These Care Teams include your primary Cardiologist (physician) and Advanced Practice Providers (APPs -  Physician Assistants and Nurse Practitioners) who all work together to provide you with the care you need, when you need it.  We recommend signing up for the patient portal called "MyChart".  Sign up information is provided on this After Visit Summary.  MyChart is used to connect with patients for Virtual Visits (Telemedicine).  Patients are able to view lab/test results, encounter notes, upcoming appointments, etc.  Non-urgent messages can be sent to your provider as well.   To learn more about what you can do with MyChart, go to [email or web address].    Your next appointment:   3-4 months  The format for your next appointment:   In Person  Provider:   [name], MD or Sande Rives, PA

## 2019-08-25 ENCOUNTER — Telehealth: Payer: Self-pay | Admitting: Cardiology

## 2019-08-25 DIAGNOSIS — I2510 Atherosclerotic heart disease of native coronary artery without angina pectoris: Secondary | ICD-10-CM

## 2019-08-25 NOTE — Telephone Encounter (Signed)
Paged by on-call answering service in regards to the patients SOB, felt to be in the setting of Brillinta.  Has a history of CAD s/p PCI to the RCA 01/2019 with CTO of RCA and LCx per cath 05/25/2019.  Currently on ASA and Brilinta for DAPT however in discussion today, he reports ongoing shortness of breath which is limiting his activity.  Discussed transition from [place] to Plavix to see if this would help however would prefer to run this past his primary cardiologist and Sande Rives, PA who have been following him more closely.  He denies chest pain or other anginal symptoms.  Callie, Patient would prefer that you call him back with recommendations as he appreciated your time spent with him in the office and the work that you have done to help him so far.  Thank you Sharee Pimple

## 2019-08-26 ENCOUNTER — Telehealth: Payer: Self-pay | Admitting: Student

## 2019-08-26 MED ORDER — CLOPIDOGREL BISULFATE 75 MG PO TABS: 75.0000 mg | ORAL_TABLET | Freq: Every day | ORAL | 3 refills | Status: DC

## 2019-08-26 NOTE — Telephone Encounter (Addendum)
Called and spoke to patient today. Please see other phone note for more details.  Darreld Mclean, PA-C 08/26/2019 11:26 PM

## 2019-08-26 NOTE — Telephone Encounter (Signed)
LMTCB

## 2019-08-26 NOTE — Telephone Encounter (Signed)
Patient would like to speak to [facility] about medication change, and about his read-out to the monitor he was wearing.

## 2019-08-26 NOTE — Telephone Encounter (Addendum)
   Called and spoke with [name]. He describes transient shortness of breath that does sound consistent with Brilinta. He reports the episodes are occurring more frequently but not worse in intensity. Will stop Brilinta and switch to Plavix. For ease, will go ahead and have patient take evening dose of Brilinta tonight and then start Plavix tomorrow morning. Sent prescription of Plavix to requested Pharmacy.  Also reviewed monitor results with patient. No recurrent episodes of palpitations since 07/29/2019 when he had that 36 episode of SVT. Metoprolol was increased at that time. Will continue current dose of Metoprolol.   Toward end of our call, we got disconnected. I tried calling the patient back but got his voicemail. Left message reiterating above instruction and asked patient to call us if he has any questions.   Will route message to Dr. [name] so that he is aware of change in antiplatelet therapy.  Darreld Mclean, PA-C 08/26/2019 3:10 PM

## 2019-08-29 ENCOUNTER — Other Ambulatory Visit: Payer: Self-pay | Admitting: Cardiology

## 2019-08-30 ENCOUNTER — Telehealth: Payer: Self-pay | Admitting: Student

## 2019-08-30 DIAGNOSIS — I25119 Atherosclerotic heart disease of native coronary artery with unspecified angina pectoris: Secondary | ICD-10-CM

## 2019-08-30 MED ORDER — NITROGLYCERIN 0.4 MG SL SUBL: 0.4000 mg | SUBLINGUAL_TABLET | SUBLINGUAL | 2 refills | Status: DC | PRN

## 2019-08-30 NOTE — Telephone Encounter (Signed)
Pt c/o medication issue:  1. Name of Medication: clopidogrel (PLAVIX) 75 MG tablet  2. How are you currently taking this medication (dosage and times per day)? As directed  3. Are you having a reaction (difficulty breathing--STAT)?   4. What is your medication issue? Pt has a spell of Angina this morning that caused him to go home from work. He has not had any symptoms this bad since before he had his stents put in   Pt c/o of Chest Pain: STAT if CP now or developed within 24 hours  1. Are you having CP right now? No. Sx have calmed down   2. Are you experiencing any other symptoms (ex. SOB, nausea, vomiting, sweating)? no  3. How long have you been experiencing CP? ~ an hour  4. Is your CP continuous or coming and going? Comes and goes   5. Have you taken Nitroglycerin? Pt does not have an rx for Nitro  Pt just started taking this medication the middle of this week and wanted to know if this was a side effect or not. The pt had some breathing issues with brilinta so switched to this medication
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
## 2019-08-30 NOTE — Telephone Encounter (Signed)
Called and spoke with patient. He reports waking up with some mild angina but states this was not worrisome to him. However, pain progressed and felt like an ice pick stabbing him in his chest all the pain to his back and left shoulder. 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 He debated calling 911. Pain eventually subsided and he is now chest pain free. No associated shortness of breath, nausea, vomiting, or diaphoresis. Currently chest pain free but he states he feels very fatigued. This episode does sound concerning and I informed patient that I cannot tell him confidently over the phone that this event was not anything serious. I advised patient that I would really prefer him to be seen in Urgent Care/ED so that at the very least and EKG could be rechecked. Patient would prefer to hold off on this for now. I emphasized importance of going to the ED if he has any recurrent chest pain or any other symptoms. Patient voiced understanding and agreed. I will also place order for sublingual Nitro as he does not have this at home. Reviewed how to take this medication.  Darreld Mclean, PA-C 08/30/2019 1:38 PM

## 2019-09-05 ENCOUNTER — Encounter: Payer: Self-pay | Admitting: Legal Medicine

## 2019-09-05 ENCOUNTER — Other Ambulatory Visit: Payer: Self-pay

## 2019-09-05 ENCOUNTER — Ambulatory Visit (INDEPENDENT_AMBULATORY_CARE_PROVIDER_SITE_OTHER): Payer: Managed Care, Other (non HMO) | Admitting: Legal Medicine

## 2019-09-05 VITALS — BP 150/70 | HR 64 | Temp 98.0°F | Resp 17 | Ht 71.0 in | Wt 225.0 lb

## 2019-09-05 DIAGNOSIS — M545 Low back pain: Secondary | ICD-10-CM

## 2019-09-05 DIAGNOSIS — I25110 Atherosclerotic heart disease of native coronary artery with unstable angina pectoris: Secondary | ICD-10-CM | POA: Diagnosis not present

## 2019-09-05 DIAGNOSIS — G8929 Other chronic pain: Secondary | ICD-10-CM

## 2019-09-05 DIAGNOSIS — M5432 Sciatica, left side: Secondary | ICD-10-CM | POA: Diagnosis not present

## 2019-09-05 DIAGNOSIS — J0100 Acute maxillary sinusitis, unspecified: Secondary | ICD-10-CM

## 2019-09-05 MED ORDER — AMOXICILLIN 875 MG PO TABS: 875.0000 mg | ORAL_TABLET | Freq: Two times a day (BID) | ORAL | 0 refills | Status: DC
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
## 2019-09-14 LAB — COMPREHENSIVE METABOLIC PANEL
ALT: 35 IU/L (ref 0–44)
AST: 27 IU/L (ref 0–40)
Albumin/Globulin Ratio: 1.6 (ref 1.2–2.2)
Albumin: 4.4 g/dL (ref 3.8–4.8)
Alkaline Phosphatase: 72 IU/L (ref 48–121)
BUN/Creatinine Ratio: 10 (ref 10–24)
BUN: 11 mg/dL (ref 8–27)
Bilirubin Total: 0.5 mg/dL (ref 0.0–1.2)
CO2: 20 mmol/L (ref 20–29)
Calcium: 9.4 mg/dL (ref 8.6–10.2)
Chloride: 107 mmol/L — ABNORMAL HIGH (ref 96–106)
Creatinine, Ser: 1.11 mg/dL (ref 0.76–1.27)
GFR calc Af Amer: 80 mL/min/{1.73_m2} (ref >59)
GFR calc non Af Amer: 69 mL/min/{1.73_m2} (ref >59)
Globulin, Total: 2.8 g/dL (ref 1.5–4.5)
Glucose: 84 mg/dL (ref 65–99)
Potassium: 5.1 mmol/L (ref 3.5–5.2)
Sodium: 141 mmol/L (ref 134–144)
Total Protein: 7.2 g/dL (ref 6.0–8.5)

## 2019-09-14 LAB — LIPID PANEL
Chol/HDL Ratio: 3.9 ratio (ref 0.0–5.0)
Cholesterol, Total: 108 mg/dL (ref 100–199)
HDL: 28 mg/dL — ABNORMAL LOW (ref >39)
LDL Chol Calc (NIH): 58 mg/dL (ref 0–99)
Triglycerides: 119 mg/dL (ref 0–149)
VLDL Cholesterol Cal: 22 mg/dL (ref 5–40)

## 2019-09-24 ENCOUNTER — Telehealth: Payer: Self-pay | Admitting: Cardiovascular Disease

## 2019-09-24 NOTE — Telephone Encounter (Signed)
    Patient calling for lab results 

## 2019-09-24 NOTE — Telephone Encounter (Signed)
Can you have him check these labs?  They were done- but not resulted. Thanks!

## 2019-09-24 NOTE — Telephone Encounter (Signed)
done

## 2019-09-25 NOTE — Telephone Encounter (Signed)
[facility] 7/21

## 2019-09-26 NOTE — Telephone Encounter (Signed)
[facility] 7/22

## 2019-09-26 NOTE — Telephone Encounter (Signed)
Patient is calling to return call from yesterday about his labs. Please call back.

## 2019-10-01 NOTE — Telephone Encounter (Signed)
Detailed message left that labs are stable and no need for return call ./cy

## 2019-10-02 ENCOUNTER — Other Ambulatory Visit: Payer: Self-pay | Admitting: Legal Medicine

## 2019-10-02 ENCOUNTER — Telehealth: Payer: Self-pay

## 2019-10-02 DIAGNOSIS — J0100 Acute maxillary sinusitis, unspecified: Secondary | ICD-10-CM

## 2019-10-02 MED ORDER — AMOXICILLIN 875 MG PO TABS: 875.0000 mg | ORAL_TABLET | Freq: Two times a day (BID) | ORAL | 0 refills | Status: DC

## 2019-10-02 NOTE — Telephone Encounter (Signed)
Patient called asking for refill on his amoxicillin because he has a problem with the appointment with dentist and he is working up to get the appointment.

## 2019-10-04 ENCOUNTER — Other Ambulatory Visit: Payer: Self-pay | Admitting: Legal Medicine

## 2019-10-04 DIAGNOSIS — F5101 Primary insomnia: Secondary | ICD-10-CM

## 2019-10-10 ENCOUNTER — Ambulatory Visit (INDEPENDENT_AMBULATORY_CARE_PROVIDER_SITE_OTHER): Payer: Managed Care, Other (non HMO)

## 2019-10-10 ENCOUNTER — Ambulatory Visit (INDEPENDENT_AMBULATORY_CARE_PROVIDER_SITE_OTHER): Payer: Managed Care, Other (non HMO) | Admitting: Orthopaedic Surgery

## 2019-10-10 ENCOUNTER — Encounter: Payer: Self-pay | Admitting: Orthopaedic Surgery

## 2019-10-10 DIAGNOSIS — M25552 Pain in left hip: Secondary | ICD-10-CM | POA: Diagnosis not present

## 2019-10-10 DIAGNOSIS — M25559 Pain in unspecified hip: Secondary | ICD-10-CM

## 2019-10-10 DIAGNOSIS — Z96642 Presence of left artificial hip joint: Secondary | ICD-10-CM | POA: Diagnosis not present

## 2019-10-10 DIAGNOSIS — M7062 Trochanteric bursitis, left hip: Secondary | ICD-10-CM | POA: Diagnosis not present

## 2019-10-10 DIAGNOSIS — I2510 Atherosclerotic heart disease of native coronary artery without angina pectoris: Secondary | ICD-10-CM
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
## 2019-10-11 ENCOUNTER — Other Ambulatory Visit: Payer: Self-pay

## 2019-10-11 DIAGNOSIS — M7062 Trochanteric bursitis, left hip: Secondary | ICD-10-CM

## 2019-10-11 DIAGNOSIS — M25559 Pain in unspecified hip: Secondary | ICD-10-CM

## 2019-10-22 ENCOUNTER — Telehealth: Payer: Self-pay

## 2019-10-22 ENCOUNTER — Encounter: Payer: Self-pay | Admitting: Legal Medicine

## 2019-10-22 ENCOUNTER — Ambulatory Visit (INDEPENDENT_AMBULATORY_CARE_PROVIDER_SITE_OTHER): Payer: Managed Care, Other (non HMO) | Admitting: Legal Medicine

## 2019-10-22 ENCOUNTER — Other Ambulatory Visit: Payer: Self-pay

## 2019-10-22 DIAGNOSIS — M5432 Sciatica, left side: Secondary | ICD-10-CM | POA: Diagnosis not present

## 2019-10-22 DIAGNOSIS — N529 Male erectile dysfunction, unspecified: Secondary | ICD-10-CM | POA: Insufficient documentation

## 2019-10-22 DIAGNOSIS — F5101 Primary insomnia: Secondary | ICD-10-CM

## 2019-10-22 DIAGNOSIS — N5237 Erectile dysfunction following prostate ablative therapy: Secondary | ICD-10-CM | POA: Diagnosis not present

## 2019-10-22 MED ORDER — ESZOPICLONE 3 MG PO TABS: 3.0000 mg | ORAL_TABLET | Freq: Every day | ORAL | 3 refills | Status: DC

## 2019-10-22 NOTE — Telephone Encounter (Signed)
   [facility] Pre-operative Risk Assessment    HEARTCARE STAFF: - Please ensure there is not already an duplicate clearance open for this procedure. - Under Visit Info/Reason for Call, type in Other and utilize the format Clearance MM/DD/YY or Clearance TBD. Do not use dashes or single digits. - If request is for dental extraction, please clarify the # of teeth to be extracted.  Request for surgical clearance:  1. What type of surgery is being performed? extraction  2. When is this surgery scheduled? tbd  3. What type of clearance is required (medical clearance vs. Pharmacy clearance to hold med vs. Both)? both  4. Are there any medications that need to be held prior to surgery and how long? Plavix/ asa  5. Practice name and name of physician performing surgery? Dr. Francetta Found  6. What is the office phone number? [phone]   7.   What is the office fax number? [phone]  8.   Anesthesia type (None, local, MAC, general) ? choice   Jacob Collier 10/22/2019, 9:40 AM  _________________________________________________________________   (provider comments below)

## 2019-10-22 NOTE — Telephone Encounter (Signed)
   Primary Cardiologist: [name], MD  Chart reviewed as part of pre-operative protocol coverage.   Simple dental extractions are considered low risk procedures per guidelines and generally do not require any specific cardiac clearance. It is also generally accepted that for simple extractions and dental cleanings, there is no need to interrupt blood thinner therapy.  He had coronary stents placed in 01/2019 and should remain on uninterrupted dual antiplatelet Rx for a minimum of 12 months post intervention.  Aspirin and Clopidogrel (Plavix) should not be held.  If bleeding risk is too great, procedure should be postponed until after 01/2019. SBE prophylaxis is not required for the patient.  Please call with questions.  Richardson Dopp, PA-C 10/22/2019, 4:21 PM

## 2019-10-22 NOTE — Telephone Encounter (Signed)
Note faxed to surgeon's office. Note will be removed from [place], [place]  4:32 PM 10/22/2019
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
## 2019-10-31 ENCOUNTER — Other Ambulatory Visit: Payer: Self-pay | Admitting: Legal Medicine

## 2019-11-25 ENCOUNTER — Ambulatory Visit: Payer: Managed Care, Other (non HMO) | Admitting: Legal Medicine

## 2019-11-27 ENCOUNTER — Encounter: Payer: Self-pay | Admitting: Cardiovascular Disease

## 2019-11-27 ENCOUNTER — Ambulatory Visit (INDEPENDENT_AMBULATORY_CARE_PROVIDER_SITE_OTHER): Payer: Managed Care, Other (non HMO) | Admitting: Cardiovascular Disease

## 2019-11-27 ENCOUNTER — Other Ambulatory Visit: Payer: Self-pay

## 2019-11-27 VITALS — BP 112/65 | HR 61 | Ht 71.0 in | Wt 232.2 lb

## 2019-11-27 DIAGNOSIS — I2510 Atherosclerotic heart disease of native coronary artery without angina pectoris: Secondary | ICD-10-CM

## 2019-11-27 DIAGNOSIS — R002 Palpitations: Secondary | ICD-10-CM

## 2019-11-27 DIAGNOSIS — I10 Essential (primary) hypertension: Secondary | ICD-10-CM | POA: Diagnosis not present

## 2019-11-27 DIAGNOSIS — I25119 Atherosclerotic heart disease of native coronary artery with unspecified angina pectoris: Secondary | ICD-10-CM

## 2019-11-27 DIAGNOSIS — I471 Supraventricular tachycardia, unspecified: Secondary | ICD-10-CM

## 2019-11-27 DIAGNOSIS — E785 Hyperlipidemia, unspecified: Secondary | ICD-10-CM

## 2019-11-27 MED ORDER — LOSARTAN POTASSIUM 25 MG PO TABS
12.5000 mg | ORAL_TABLET | Freq: Every day | ORAL | 3 refills | Status: DC
Start: 2019-11-27 — End: 2020-07-15

## 2019-11-27 MED ORDER — AMLODIPINE BESYLATE 5 MG PO TABS: 5.0000 mg | ORAL_TABLET | Freq: Every day | ORAL | 3 refills | Status: DC
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
## 2019-11-27 NOTE — Patient Instructions (Signed)
Medication Instructions:  INCREASE YOUR AMLODIPINE TO 5 MG DAILY  DECREASE YOUR LOSARTAN TO 12.5MG  DAILY (1/2 TAB)  *If you need a refill on your cardiac medications before your next appointment, please call your pharmacy*     Follow-Up: At [facility], you and your health needs are our priority.  As part of our continuing mission to provide you with exceptional heart care, we have created designated Provider Care Teams.  These Care Teams include your primary Cardiologist (physician) and Advanced Practice Providers (APPs -  Physician Assistants and Nurse Practitioners) who all work together to provide you with the care you need, when you need it.  We recommend signing up for the patient portal called "MyChart".  Sign up information is provided on this After Visit Summary.  MyChart is used to connect with patients for Virtual Visits (Telemedicine).  Patients are able to view lab/test results, encounter notes, upcoming appointments, etc.  Non-urgent messages can be sent to your provider as well.   To learn more about what you can do with MyChart, go to [email or web address].    Your next appointment:   4-6 month(s)  The format for your next appointment:   In Person  Provider:   [name], MD

## 2019-11-28 ENCOUNTER — Encounter: Payer: Self-pay | Admitting: Legal Medicine

## 2019-11-28 ENCOUNTER — Ambulatory Visit (INDEPENDENT_AMBULATORY_CARE_PROVIDER_SITE_OTHER): Payer: Managed Care, Other (non HMO) | Admitting: Legal Medicine

## 2019-11-28 VITALS — BP 124/78 | HR 67 | Temp 97.9°F | Ht 71.0 in | Wt 230.4 lb

## 2019-11-28 DIAGNOSIS — I2510 Atherosclerotic heart disease of native coronary artery without angina pectoris: Secondary | ICD-10-CM | POA: Diagnosis not present

## 2019-11-28 DIAGNOSIS — M7552 Bursitis of left shoulder: Secondary | ICD-10-CM | POA: Diagnosis not present

## 2019-11-28 DIAGNOSIS — R739 Hyperglycemia, unspecified: Secondary | ICD-10-CM

## 2019-11-28 DIAGNOSIS — C61 Malignant neoplasm of prostate: Secondary | ICD-10-CM | POA: Diagnosis not present
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
## 2019-11-29 LAB — HEMOGLOBIN A1C
Est. average glucose Bld gHb Est-mCnc: 120 mg/dL
Hgb A1c MFr Bld: 5.8 % — ABNORMAL HIGH (ref 4.8–5.6)

## 2019-11-29 LAB — PSA: Prostate Specific Ag, Serum: <0.1 ng/mL (ref 0.0–4.0)

## 2019-11-29 NOTE — Progress Notes (Signed)
A1c prediabetic range, need to be on diabetic diet, PSA <0.1 lp

## 2019-12-06 ENCOUNTER — Other Ambulatory Visit: Payer: Self-pay | Admitting: Legal Medicine

## 2019-12-06 DIAGNOSIS — I25110 Atherosclerotic heart disease of native coronary artery with unstable angina pectoris: Secondary | ICD-10-CM

## 2020-01-03 ENCOUNTER — Ambulatory Visit: Payer: Managed Care, Other (non HMO) | Admitting: Legal Medicine

## 2020-01-04 ENCOUNTER — Other Ambulatory Visit: Payer: Self-pay | Admitting: Family Medicine

## 2020-01-04 DIAGNOSIS — I25110 Atherosclerotic heart disease of native coronary artery with unstable angina pectoris: Secondary | ICD-10-CM

## 2020-01-06 ENCOUNTER — Other Ambulatory Visit: Payer: Self-pay

## 2020-01-06 NOTE — Telephone Encounter (Signed)
Your pt. Kc  

## 2020-01-20 ENCOUNTER — Other Ambulatory Visit: Payer: Self-pay

## 2020-01-20 DIAGNOSIS — I25110 Atherosclerotic heart disease of native coronary artery with unstable angina pectoris: Secondary | ICD-10-CM

## 2020-01-20 MED ORDER — CLONAZEPAM 0.5 MG PO TABS: 0.5000 mg | ORAL_TABLET | Freq: Three times a day (TID) | ORAL | 3 refills | Status: DC | PRN

## 2020-01-23 ENCOUNTER — Encounter: Payer: Self-pay | Admitting: Legal Medicine

## 2020-01-23 ENCOUNTER — Other Ambulatory Visit: Payer: Self-pay

## 2020-01-23 ENCOUNTER — Ambulatory Visit (INDEPENDENT_AMBULATORY_CARE_PROVIDER_SITE_OTHER): Payer: Managed Care, Other (non HMO) | Admitting: Legal Medicine

## 2020-01-23 VITALS — BP 122/78 | HR 62 | Temp 98.4°F | Ht 71.0 in | Wt 228.0 lb

## 2020-01-23 DIAGNOSIS — N12 Tubulo-interstitial nephritis, not specified as acute or chronic: Secondary | ICD-10-CM | POA: Diagnosis not present

## 2020-01-23 LAB — POCT URINALYSIS DIP (CLINITEK)
Bilirubin, UA: NEGATIVE
Blood, UA: NEGATIVE
Glucose, UA: NEGATIVE mg/dL
Ketones, POC UA: NEGATIVE mg/dL
Nitrite, UA: POSITIVE — AB
Spec Grav, UA: 1.02 (ref 1.010–1.025)
Urobilinogen, UA: 1 E.U./dL
pH, UA: 6 (ref 5.0–8.0)

## 2020-01-23 MED ORDER — CEFTRIAXONE SODIUM 1 G IJ SOLR
1.0000 g | INTRAMUSCULAR | Status: DC
  Administered 2020-01-23: 1 g via INTRAMUSCULAR
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
## 2020-02-03 ENCOUNTER — Ambulatory Visit (INDEPENDENT_AMBULATORY_CARE_PROVIDER_SITE_OTHER): Payer: Managed Care, Other (non HMO) | Admitting: Legal Medicine

## 2020-02-03 ENCOUNTER — Encounter: Payer: Self-pay | Admitting: Legal Medicine

## 2020-02-03 VITALS — BP 118/66 | HR 64 | Temp 97.3°F | Resp 17 | Ht 71.0 in | Wt 230.0 lb

## 2020-02-03 DIAGNOSIS — J180 Bronchopneumonia, unspecified organism: Secondary | ICD-10-CM | POA: Insufficient documentation

## 2020-02-03 DIAGNOSIS — R300 Dysuria: Secondary | ICD-10-CM | POA: Diagnosis not present

## 2020-02-03 LAB — POCT URINALYSIS DIP (CLINITEK)
Bilirubin, UA: NEGATIVE
Blood, UA: NEGATIVE
Glucose, UA: NEGATIVE mg/dL
Ketones, POC UA: NEGATIVE mg/dL
Leukocytes, UA: NEGATIVE
Nitrite, UA: NEGATIVE
Spec Grav, UA: 1.02 (ref 1.010–1.025)
Urobilinogen, UA: 0.2 E.U./dL
pH, UA: 6 (ref 5.0–8.0)

## 2020-02-03 MED ORDER — TRIAMCINOLONE ACETONIDE 40 MG/ML IJ SUSP
80.0000 mg | Freq: Once | INTRAMUSCULAR | Status: AC
  Administered 2020-02-03: 80 mg via INTRAMUSCULAR

## 2020-02-03 MED ORDER — CIPROFLOXACIN HCL 500 MG PO TABS: 500.0000 mg | ORAL_TABLET | Freq: Two times a day (BID) | ORAL | 0 refills | Status: DC

## 2020-02-03 NOTE — Addendum Note (Signed)
Addended by: Thompson Caul I on: 02/03/2020 04:51 PM   Modules accepted: Orders

## 2020-02-03 NOTE — Progress Notes (Addendum)
Subjective:  Patient ID: Jacob Collier, male    DOB: [birth year]  Age: 64 y.o. MRN: [id number]  Chief Complaint  Patient presents with  . Cough    dry cough started 3 to 4 weeks ago  . Fatigue    HPI: rapid covid negative.  coughing a lot.  4 weeks since starting losartan.  He is not tolerating this well as he did not tolerate the lisin0pril.  Urine dark. He had pyelonephritis.  Pain gone infection.  He is coughing a lot for 4 weeks, he is out of Bactrim, cough dry .  He took wife's prednisone and it helped  Patient is not feeling well today and has multiple complaints is somewhat difficult to understand his complaints but he still is worried about extreme fatigue, chronic cough which may be related to the ARB, he will talk to his cardiologist about stopping this if possible.  He is also having brownish urine although there is no blood in it and: Urinalysis is normal.  He will see his urologist next week.  He says he is not on any medications or drinks that could cause a brown urine that he can think of.  We had to discussed this at length.   Current Outpatient Medications on File Prior to Visit  Medication Sig Dispense Refill  . amLODipine (NORVASC) 5 MG tablet Take 1 tablet (5 mg total) by mouth daily. 90 tablet 3  . aspirin 81 MG EC tablet Take 81 mg by mouth daily.     [name] atorvastatin (LIPITOR) 20 MG tablet Take 1 tablet (20 mg total) by mouth daily at 6 PM. 90 tablet 3  . clonazePAM (KLONOPIN) 0.5 MG tablet Take 1 tablet (0.5 mg total) by mouth 3 (three) times daily as needed for anxiety. 90 tablet 3  . clopidogrel (PLAVIX) 75 MG tablet Take 1 tablet (75 mg total) by mouth daily. 90 tablet 3  . Eszopiclone 3 MG TABS Take 1 tablet (3 mg total) by mouth at bedtime. 30 tablet 3  . ezetimibe (ZETIA) 10 MG tablet Take 1 tablet (10 mg total) by mouth daily. 90 tablet 3  . isosorbide mononitrate (IMDUR) 30 MG 24 hr tablet Take 15 mg by mouth in the morning and at bedtime.     [name] losartan  (COZAAR) 25 MG tablet Take 0.5 tablets (12.5 mg total) by mouth daily. 45 tablet 3  . metoprolol succinate (TOPROL XL) 25 MG 24 hr tablet Take 1 tablet (25 mg total) by mouth daily. 90 tablet 3  . Omega-3 Fatty Acids (FISH OIL) 1000 MG CAPS Take 1,000 mg by mouth daily.     . nitroGLYCERIN (NITROSTAT) 0.4 MG SL tablet Place 1 tablet (0.4 mg total) under the tongue every 5 (five) minutes as needed for chest pain. 25 tablet 2   No current facility-administered medications on file prior to visit.   Past Medical History:  Diagnosis Date  . Arthritis   . Coronary artery disease    2 stents  . Diverticulosis   . Hard of hearing   . Hypertension    hx of elevation on lyrica, off med and now normal   . Wears glasses    Past Surgical History:  Procedure Laterality Date  . PROSTATECTOMY    . TOTAL HIP ARTHROPLASTY Left 11/10/2015   Procedure: LEFT TOTAL HIP ARTHROPLASTY ANTERIOR APPROACH;  Surgeon: Mcarthur Rossetti, MD;  Location: [place];  Service: Orthopedics;  Laterality: Left;    Family History  Problem  Relation Age of Onset  . Alzheimer's disease Mother   . Stroke Father   . Diabetes Sister   . Heart attack Maternal Grandmother   . Cancer Maternal Uncle        prostate  . Prostate cancer Maternal Uncle   . Cancer Maternal Uncle        prostate  . Prostate cancer Maternal Uncle   . Colon polyps Brother   . Colon cancer Neg Hx   . Esophageal cancer Neg Hx   . Rectal cancer Neg Hx   . Stomach cancer Neg Hx    Social History   Socioeconomic History  . Marital status: Married    Spouse name: Not on file  . Number of children: 0  . Years of education: Not on file  . Highest education level: Not on file  Occupational History  . Occupation: Employed at [employer]  Tobacco Use  . Smoking status: Never Smoker  . Smokeless tobacco: Never Used  Vaping Use  . Vaping Use: Former  Substance and Sexual Activity  . Alcohol use: Not Currently    Alcohol/week: 0.0 standard  drinks    Comment: rarely  . Drug use: No  . Sexual activity: Yes    Partners: Female    Birth control/protection: None  Other Topics Concern  . Not on file  Social History Narrative  . Not on file   Social Determinants of Health   Financial Resource Strain:   . Difficulty of Paying Living Expenses: Not on file  Food Insecurity:   . Worried About Charity fundraiser in the Last Year: Not on file  . Ran Out of Food in the Last Year: Not on file  Transportation Needs:   . Lack of Transportation (Medical): Not on file  . Lack of Transportation (Non-Medical): Not on file  Physical Activity:   . Days of Exercise per Week: Not on file  . Minutes of Exercise per Session: Not on file  Stress:   . Feeling of Stress : Not on file  Social Connections:   . Frequency of Communication with Friends and Family: Not on file  . Frequency of Social Gatherings with Friends and Family: Not on file  . Attends Religious Services: Not on file  . Active Member of Clubs or Organizations: Not on file  . Attends Archivist Meetings: Not on file  . Marital Status: Not on file    Review of Systems  Constitutional: Positive for activity change and fatigue. Negative for appetite change and fever.  HENT: Negative.   Eyes: Negative for visual disturbance.  Respiratory: Positive for cough. Negative for choking, chest tightness and shortness of breath.   Cardiovascular: Negative for chest pain and leg swelling.  Gastrointestinal: Negative.   Endocrine: Negative for polyuria.  Genitourinary: Negative for difficulty urinating and dysuria.  Neurological: Negative.   Psychiatric/Behavioral: Negative.      Objective:  BP 118/66 (BP Location: Right Arm, Patient Position: Sitting, Cuff Size: Normal)   Pulse 64   Temp (!) 97.3 F (36.3 C) (Temporal)   Resp 17   Ht 5\' 11"  (1.803 m)   Wt 230 lb (104.3 kg)   SpO2 95%   BMI 32.08 kg/m   BP/Weight 02/03/2020 01/23/2020 9/62/9528  Systolic BP  413 244 010  Diastolic BP 66 78 78  Wt. (Lbs) 230 228 230.4  BMI 32.08 31.8 32.13    Physical Exam Vitals reviewed.  Constitutional:      Appearance: Normal  appearance.  Eyes:     Extraocular Movements: Extraocular movements intact.     Conjunctiva/sclera: Conjunctivae normal.     Pupils: Pupils are equal, round, and reactive to light.  Cardiovascular:     Rate and Rhythm: Normal rate and regular rhythm.     Pulses: Normal pulses.     Heart sounds: Normal heart sounds.  Pulmonary:     Breath sounds: Rhonchi present.  Abdominal:     General: Abdomen is flat. Bowel sounds are normal.     Palpations: Abdomen is soft.  Neurological:     Mental Status: He is alert.       Lab Results  Component Value Date   WBC 6.9 06/11/2019   HGB 15.5 06/11/2019   HCT 45.9 06/11/2019   PLT 219 06/11/2019   GLUCOSE 84 09/13/2019   CHOL 108 09/13/2019   TRIG 119 09/13/2019   HDL 28 (L) 09/13/2019   LDLCALC 58 09/13/2019   ALT 35 09/13/2019   AST 27 09/13/2019   NA 141 09/13/2019   K 5.1 09/13/2019   CL 107 (H) 09/13/2019   CREATININE 1.11 09/13/2019   BUN 11 09/13/2019   CO2 20 09/13/2019   INR 1.03 06/24/2009   HGBA1C 5.8 (H) 11/28/2019      Assessment & Plan:   Diagnoses and all orders for this visit: Bronchial pneumonia -     ciprofloxacin (CIPRO) 500 MG tablet; Take 1 tablet (500 mg total) by mouth 2 (two) times daily. -     triamcinolone acetonide (KENALOG-40) injection 80 mg Patient is seen today with a bronchial pneumonia he has been coughing but is clear this may be related to his ARB.  He denies GERD.  There is no sputum production but he is feeling very tired neck difficulty getting out of bed which is unusual for him.  He did take 40 mg of his wife's prednisone and is feeling better this morning.  We will treat with a Kenalog shot of 80 mg and put him on ciprofloxacin which he requested.  His urinalysis was normal and we discussed this.  We also discussed his other  problems and is possible further sphincter surgery from his urologist.  Everyone out of cipro, so ceftin called in      I spent 20 minutes dedicated to the care of this patient on the date of this encounter to include face-to-face time with the patient, as wellDiscussing the patient's multiple problems as well as reviewing old records from urology.  Note for work to return on Wednesday.  Follow-up: Return in about 1 month (around 03/04/2020).  An After Visit Summary was printed and given to the patient.  Reinaldo Meeker, MD [facility] [phone]

## 2020-02-04 MED ORDER — CEFUROXIME AXETIL 500 MG PO TABS: 500.0000 mg | ORAL_TABLET | Freq: Two times a day (BID) | ORAL | 0 refills | Status: DC

## 2020-02-04 NOTE — Addendum Note (Signed)
Addended by: Reinaldo Meeker on: 02/04/2020 10:35 AM   Modules accepted: Orders

## 2020-02-05 HISTORY — PX: EYE SURGERY: SHX253

## 2020-03-16 ENCOUNTER — Ambulatory Visit (INDEPENDENT_AMBULATORY_CARE_PROVIDER_SITE_OTHER): Payer: Managed Care, Other (non HMO) | Admitting: Legal Medicine

## 2020-03-16 ENCOUNTER — Encounter: Payer: Self-pay | Admitting: Legal Medicine

## 2020-03-16 ENCOUNTER — Other Ambulatory Visit: Payer: Self-pay

## 2020-03-16 VITALS — BP 130/80 | HR 75 | Temp 97.8°F | Resp 16 | Ht 71.0 in | Wt 222.6 lb

## 2020-03-16 DIAGNOSIS — R300 Dysuria: Secondary | ICD-10-CM | POA: Diagnosis not present

## 2020-03-16 DIAGNOSIS — E860 Dehydration: Secondary | ICD-10-CM | POA: Diagnosis not present

## 2020-03-16 DIAGNOSIS — M545 Low back pain, unspecified: Secondary | ICD-10-CM

## 2020-03-16 DIAGNOSIS — G8929 Other chronic pain: Secondary | ICD-10-CM

## 2020-03-16 DIAGNOSIS — I25110 Atherosclerotic heart disease of native coronary artery with unstable angina pectoris: Secondary | ICD-10-CM | POA: Diagnosis not present

## 2020-03-16 LAB — POCT URINALYSIS DIP (CLINITEK)
Bilirubin, UA: NEGATIVE
Glucose, UA: NEGATIVE mg/dL
Leukocytes, UA: NEGATIVE
Nitrite, UA: NEGATIVE
POC PROTEIN,UA: 30 — AB
Spec Grav, UA: >=1.03 — AB (ref 1.010–1.025)
Urobilinogen, UA: 0.2 E.U./dL
pH, UA: 6 (ref 5.0–8.0)

## 2020-03-16 NOTE — Progress Notes (Signed)
Subjective:  Patient ID: Jacob Collier, male    DOB: 1955-08-07  Age: 65 y.o. MRN: [id number]  Chief Complaint  Patient presents with  . Back Pain    Patient states both sides of lower back hurt and have burning sensation. States his urine is darker in color.     HPI: flank pain off and on, no urinary symptoms.  Always has pain.  Urine changes color off cipro.  He is drinking well.  No dysuria or frequency. Low back pain. Ct April 2021 shows renal cyst on right.  Feels different from back pain.   Current Outpatient Medications on File Prior to Visit  Medication Sig Dispense Refill  . amLODipine (NORVASC) 5 MG tablet Take 1 tablet (5 mg total) by mouth daily. 90 tablet 3  . aspirin 81 MG EC tablet Take 81 mg by mouth daily.     [name] atorvastatin (LIPITOR) 20 MG tablet Take 1 tablet (20 mg total) by mouth daily at 6 PM. 90 tablet 3  . cefUROXime (CEFTIN) 500 MG tablet Take 1 tablet (500 mg total) by mouth 2 (two) times daily with a meal. 20 tablet 0  . clonazePAM (KLONOPIN) 0.5 MG tablet Take 1 tablet (0.5 mg total) by mouth 3 (three) times daily as needed for anxiety. 90 tablet 3  . clopidogrel (PLAVIX) 75 MG tablet Take 1 tablet (75 mg total) by mouth daily. 90 tablet 3  . Eszopiclone 3 MG TABS Take 1 tablet (3 mg total) by mouth at bedtime. 30 tablet 3  . ezetimibe (ZETIA) 10 MG tablet Take 1 tablet (10 mg total) by mouth daily. 90 tablet 3  . isosorbide mononitrate (IMDUR) 30 MG 24 hr tablet Take 15 mg by mouth in the morning and at bedtime.     . metoprolol succinate (TOPROL XL) 25 MG 24 hr tablet Take 1 tablet (25 mg total) by mouth daily. 90 tablet 3  . Omega-3 Fatty Acids (FISH OIL) 1000 MG CAPS Take 1,000 mg by mouth daily.     [name] losartan (COZAAR) 25 MG tablet Take 0.5 tablets (12.5 mg total) by mouth daily. 45 tablet 3  . nitroGLYCERIN (NITROSTAT) 0.4 MG SL tablet Place 1 tablet (0.4 mg total) under the tongue every 5 (five) minutes as needed for chest pain. 25 tablet 2   No  current facility-administered medications on file prior to visit.   Past Medical History:  Diagnosis Date  . Arthritis   . Coronary artery disease    2 stents  . Diverticulosis   . Hard of hearing   . Hypertension    hx of elevation on lyrica, off med and now normal   . Wears glasses    Past Surgical History:  Procedure Laterality Date  . EYE SURGERY  02/2020   bilaterl cataracts  . PROSTATECTOMY    . TOTAL HIP ARTHROPLASTY Left 11/10/2015   Procedure: LEFT TOTAL HIP ARTHROPLASTY ANTERIOR APPROACH;  Surgeon: Mcarthur Rossetti, MD;  Location: [place];  Service: Orthopedics;  Laterality: Left;    Family History  Problem Relation Age of Onset  . Alzheimer's disease Mother   . Stroke Father   . Diabetes Sister   . Heart attack Maternal Grandmother   . Cancer Maternal Uncle        prostate  . Prostate cancer Maternal Uncle   . Cancer Maternal Uncle        prostate  . Prostate cancer Maternal Uncle   . Colon polyps Brother   .  Colon cancer Neg Hx   . Esophageal cancer Neg Hx   . Rectal cancer Neg Hx   . Stomach cancer Neg Hx    Social History   Socioeconomic History  . Marital status: Married    Spouse name: Not on file  . Number of children: 0  . Years of education: Not on file  . Highest education level: Not on file  Occupational History  . Occupation: Employed at [employer]  Tobacco Use  . Smoking status: Never Smoker  . Smokeless tobacco: Never Used  Vaping Use  . Vaping Use: Former  Substance and Sexual Activity  . Alcohol use: Not Currently    Alcohol/week: 0.0 standard drinks    Comment: rarely  . Drug use: No  . Sexual activity: Yes    Partners: Female    Birth control/protection: None  Other Topics Concern  . Not on file  Social History Narrative  . Not on file   Social Determinants of Health   Financial Resource Strain: Not on file  Food Insecurity: Not on file  Transportation Needs: Not on file  Physical Activity: Not on file  Stress:  Not on file  Social Connections: Not on file    Review of Systems  Constitutional: Negative for activity change, fatigue and fever.  HENT: Negative for congestion.   Eyes: Negative for visual disturbance.  Respiratory: Negative for apnea and shortness of breath.   Cardiovascular: Negative for chest pain, palpitations and leg swelling.  Endocrine: Negative for polyuria.  Genitourinary: Negative for difficulty urinating and dysuria.     Objective:  BP 130/80 (BP Location: Right Arm, Patient Position: Sitting, Cuff Size: Normal)   Pulse 75   Temp 97.8 F (36.6 C) (Temporal)   Resp 16   Ht 5\' 11"  (1.803 m)   Wt 222 lb 9.6 oz (101 kg)   SpO2 95%   BMI 31.05 kg/m   BP/Weight 03/16/2020 02/03/2020 16/03/930  Systolic BP 355 732 202  Diastolic BP 80 66 78  Wt. (Lbs) 222.6 230 228  BMI 31.05 32.08 31.8    Physical Exam Vitals reviewed.  Constitutional:      Appearance: Normal appearance.  HENT:     Right Ear: Tympanic membrane normal.     Left Ear: Tympanic membrane normal.  Eyes:     Extraocular Movements: Extraocular movements intact.     Conjunctiva/sclera: Conjunctivae normal.     Pupils: Pupils are equal, round, and reactive to light.  Cardiovascular:     Rate and Rhythm: Normal rate and regular rhythm.     Pulses: Normal pulses.     Heart sounds: Normal heart sounds.  Pulmonary:     Effort: Pulmonary effort is normal.     Breath sounds: Normal breath sounds.  Abdominal:     General: Abdomen is flat. Bowel sounds are normal.     Palpations: Abdomen is soft.  Musculoskeletal:        General: Tenderness (lumbar pain) present.     Cervical back: Normal range of motion and neck supple.     Lumbar back: Spasms and tenderness present. No swelling or deformity. Negative right straight leg raise test and negative left straight leg raise test.       Back:     Comments: Spasm lumbar back  Skin:    General: Skin is warm and dry.     Capillary Refill: Capillary refill  takes less than 2 seconds.  Neurological:     General: No focal deficit  present.     Mental Status: He is alert and oriented to person, place, and time.       Lab Results  Component Value Date   WBC 6.9 06/11/2019   HGB 15.5 06/11/2019   HCT 45.9 06/11/2019   PLT 219 06/11/2019   GLUCOSE 84 09/13/2019   CHOL 108 09/13/2019   TRIG 119 09/13/2019   HDL 28 (L) 09/13/2019   LDLCALC 58 09/13/2019   ALT 35 09/13/2019   AST 27 09/13/2019   NA 141 09/13/2019   K 5.1 09/13/2019   CL 107 (H) 09/13/2019   CREATININE 1.11 09/13/2019   BUN 11 09/13/2019   CO2 20 09/13/2019   INR 1.03 06/24/2009   HGBA1C 5.8 (H) 11/28/2019      Assessment & Plan:   Diagnoses and all orders for this visit: Chronic bilateral low back pain without sciatica Patient has chro ic LBP from working and has spasm, we discussed exercises and stretching  Dehydration Patient's specific gravity is 1.030, he is highly concentrated and has color to urine as expected, no other abnormality  Unstable angina pectoris due to coronary arteriosclerosis ([place]) -     CBC with Differential/Platelet -     Comprehensive metabolic panel An individual plan was formulated based on patient history and exam, labs and evidence based data. Patient has not had recent angina or nitroglycerin use. continue present treatment. Dysuria -     Urine Culture -     POCT URINALYSIS DIP (CLINITEK) Check urine culture         I spent 20 minutes dedicated to the care of this patient on the date of this encounter to include face-to-face time with the patient, as well as:   Follow-up: Return in about 3 months (around 06/14/2020) for fasting.  An After Visit Summary was printed and given to the patient.  Reinaldo Meeker, MD [facility] [phone]

## 2020-03-16 NOTE — Patient Instructions (Signed)
Acute Back Pain, Adult Acute back pain is sudden and usually short-lived. It is often caused by an injury to the muscles and tissues in the back. The injury may result from:  A muscle or ligament getting overstretched or torn (strained). Ligaments are tissues that connect bones to each other. Lifting something improperly can cause a back strain.  Wear and tear (degeneration) of the spinal disks. Spinal disks are circular tissue that provide cushioning between the bones of the spine (vertebrae).  Twisting motions, such as while playing sports or doing yard work.  A hit to the back.  Arthritis. You may have a physical exam, lab tests, and imaging tests to find the cause of your pain. Acute back pain usually goes away with rest and home care. Follow these instructions at home: Managing pain, stiffness, and swelling  Treatment may include medicines for pain and inflammation that are taken by mouth or applied to the skin, prescription pain medicine, or muscle relaxants. Take over-the-counter and prescription medicines only as told by your health care provider.  Your health care provider may recommend applying ice during the first 24-48 hours after your pain starts. To do this: ? Put ice in a plastic bag. ? Place a towel between your skin and the bag. ? Leave the ice on for 20 minutes, 2-3 times a day.  If directed, apply heat to the affected area as often as told by your health care provider. Use the heat source that your health care provider recommends, such as a moist heat pack or a heating pad. ? Place a towel between your skin and the heat source. ? Leave the heat on for 20-30 minutes. ? Remove the heat if your skin turns bright red. This is especially important if you are unable to feel pain, heat, or cold. You have a greater risk of getting burned. Activity  Do not stay in bed. Staying in bed for more than 1-2 days can delay your recovery.  Sit up and stand up straight. Avoid leaning  forward when you sit or hunching over when you stand. ? If you work at a desk, sit close to it so you do not need to lean over. Keep your chin tucked in. Keep your neck drawn back, and keep your elbows bent at a 90-degree angle (right angle). ? Sit high and close to the steering wheel when you drive. Add lower back (lumbar) support to your car seat, if needed.  Take short walks on even surfaces as soon as you are able. Try to increase the length of time you walk each day.  Do not sit, drive, or stand in one place for more than 30 minutes at a time. Sitting or standing for long periods of time can put stress on your back.  Do not drive or use heavy machinery while taking prescription pain medicine.  Use proper lifting techniques. When you bend and lift, use positions that put less stress on your back: ? Bend your knees. ? Keep the load close to your body. ? Avoid twisting.  Exercise regularly as told by your health care provider. Exercising helps your back heal faster and helps prevent back injuries by keeping muscles strong and flexible.  Work with a physical therapist to make a safe exercise program, as recommended by your health care provider. Do any exercises as told by your physical therapist.   Lifestyle  Maintain a healthy weight. Extra weight puts stress on your back and makes it difficult to have   good posture.  Avoid activities or situations that make you feel anxious or stressed. Stress and anxiety increase muscle tension and can make back pain worse. Learn ways to manage anxiety and stress, such as through exercise. General instructions  Sleep on a firm mattress in a comfortable position. Try lying on your side with your knees slightly bent. If you lie on your back, put a pillow under your knees.  Follow your treatment plan as told by your health care provider. This may include: ? Cognitive or behavioral therapy. ? Acupuncture or massage therapy. ? Meditation or yoga. Contact  a health care provider if:  You have pain that is not relieved with rest or medicine.  You have increasing pain going down into your legs or buttocks.  Your pain does not improve after 2 weeks.  You have pain at night.  You lose weight without trying.  You have a fever or chills. Get help right away if:  You develop new bowel or bladder control problems.  You have unusual weakness or numbness in your arms or legs.  You develop nausea or vomiting.  You develop abdominal pain.  You feel faint. Summary  Acute back pain is sudden and usually short-lived.  Use proper lifting techniques. When you bend and lift, use positions that put less stress on your back.  Take over-the-counter and prescription medicines and apply heat or ice as directed by your health care provider. This information is not intended to replace advice given to you by your health care provider. Make sure you discuss any questions you have with your health care provider. Document Revised: 11/15/2019 Document Reviewed: 11/15/2019 Elsevier Patient Education  2021 [employer]  

## 2020-03-18 LAB — URINE CULTURE

## 2020-03-18 NOTE — Progress Notes (Signed)
Urine culture no growth lp

## 2020-03-31 ENCOUNTER — Telehealth: Payer: Self-pay

## 2020-03-31 NOTE — Telephone Encounter (Signed)
Jacob Collier called this morning stating that her husband fell at work and needs to make and apt about his back. I stopped her an stated that if the accident occurred at work then he will need to contact his HR for his company and discuss with them how they want to proceed. Jacob Collier then stated that he doesn't want to process this through work. I told her that his insurance more than likely will not pay for this visit being that it occurred at work because the provider will document in the note when/where it occurred. Jacob Collier stated she will have him call back and maybe she just heard him wrong that this did not occur at work.   Provider and nurse was notified of this conversation.

## 2020-03-31 NOTE — Telephone Encounter (Signed)
I called patient and leave a message to call us back to know how is he doing.

## 2020-04-01 ENCOUNTER — Ambulatory Visit (INDEPENDENT_AMBULATORY_CARE_PROVIDER_SITE_OTHER): Payer: Managed Care, Other (non HMO) | Admitting: Cardiovascular Disease

## 2020-04-01 ENCOUNTER — Other Ambulatory Visit: Payer: Self-pay

## 2020-04-01 ENCOUNTER — Encounter: Payer: Self-pay | Admitting: Cardiovascular Disease

## 2020-04-01 DIAGNOSIS — I471 Supraventricular tachycardia, unspecified: Secondary | ICD-10-CM

## 2020-04-01 DIAGNOSIS — E785 Hyperlipidemia, unspecified: Secondary | ICD-10-CM | POA: Diagnosis not present

## 2020-04-01 DIAGNOSIS — I25119 Atherosclerotic heart disease of native coronary artery with unspecified angina pectoris: Secondary | ICD-10-CM | POA: Diagnosis not present

## 2020-04-01 DIAGNOSIS — Z8546 Personal history of malignant neoplasm of prostate: Secondary | ICD-10-CM

## 2020-04-01 DIAGNOSIS — I10 Essential (primary) hypertension: Secondary | ICD-10-CM | POA: Diagnosis not present

## 2020-04-01 DIAGNOSIS — Z79899 Other long term (current) drug therapy: Secondary | ICD-10-CM

## 2020-04-01 DIAGNOSIS — F419 Anxiety disorder, unspecified: Secondary | ICD-10-CM

## 2020-04-01 MED ORDER — ROSUVASTATIN CALCIUM 10 MG PO TABS: 10.0000 mg | ORAL_TABLET | ORAL | 6 refills | Status: DC

## 2020-04-01 NOTE — Progress Notes (Signed)
Cardiology Office Note    Date:  04/02/2020   ID:  Jacob Collier, Jacob Collier 1956-01-23, MRN [id number]  PCP:  [name], MD  Cardiologist:  [name], MD   No chief complaint on file.  4 month F/U   History of Present Illness:  Jacob Collier is a 65 y.o. male who is a former patient of Dr. Debara Pickett.  He last saw Dr. Debara Pickett in 2018.  He has known CAD since 2018 has been followed by cardiology in [facility].  He is status post PCI to the proximal to mid RCA at catheterization on January 04, 2019 and had known CTO of his circumflex.  Suddenly he had undergone repeat catheterization in March 2021 showing in-stent restenosis of his RCA stent which progressed to chronic total occlusion as well as chronic total occlusion of his circumflex.  His LAD was patent.  He had been seen by his cardiologist when he presented to [facility] on June 07, 2019 requesting a second opinion.  At that time I saw him in the hospital and ultimately was able to obtain the angiographic studies that were done at [facility] for his most recent catheterization of May 25, 2019.  When that time I reviewed the angiographic films with Dr. Martinique who did not feel his CTO of his RCA was amenable to intervention due to lack of any beak large occlusion at the site of a small branch.  Complex was not amenable to intervention.  He did have some concomitant CAD.  After much discussion I reviewed the data with the patient and felt that medical therapy was the best option.  There was some concern in the past and he did not benefit from initial dosing of ranolazine.  He was discharged the following day and increased medical regimen consisting of isosorbide 30 mg, amlodipine, aspirin and Plavix in addition to his previous lisinopril.  He was bradycardic in the 50s.  He also was treated with high potency statin therapy and atorvastatin was increased to 80 mg.  He presented to the emergency room on June 11, 2019 with  palpitations and neck tightness.  EMS reported a narrow complex tachycardia at a rate of 160 bpm.  Upon arrival to the emergency room he was in sinus rhythm with PVCs and bigeminy.  He was suspected to have had very brief episode of SVT and the plan was to start Toprol-XL 25 mg daily.  I saw him after his ER evaluation in June 25, 2019.  He had  decided to continue his cardiology care with me since his hospitalization.  He has felt improved and denies any significant increasing chest pain symptoms or recurrent episodes of SVT.[name]  He has noticed development of myalgias since his increased atorvastatin dose.  He also has noticed a mild cough on lisinopril.  During that evaluation, in light of his cough sensitization I recommended switching to losartan who started him on 25 mg daily.  Due to issues with atorvastatin causing myalgias I recommended he reduce his dose to 20 mg and also recommended initiation of Zetia 10 mg.  We discussed if he continued to have increasing anginal symptomatology.  Repeat challenge with Ranexa could be undertaken.  He was subsequently evaluated by Sande Rives, PA-C in June 2021.  He had a cardiac monitor which showed predominant sinus rhythm at 65 bpm.  The slowest heart rate was sinus bradycardia at 42 bpm the fastest heart rate was sinus tachycardia 164 bpm.  He had occasional PVCs with a ventricular couplet and several episodes of short-lived ventricular bigeminal rhythm.  There was a 4 beat episode of nonsustained VT, 92 bpm.  He also had another episode of SVT lasting 36 seconds.  I last saw him in September 2021.  Since he has been on a beta-blocker therapy, he denied any recurrent tachycardia dysrhythmias.  He was on a regimen consisting of amlodipine 2.5 mg, isosorbide 30 mg but he is taking this 50 mg twice a day, losartan 25 mg, Toprol-XL 25 mg daily.  He continues to be on DAPT with aspirin/Plavix.  He is tolerating atorvastatin 20 mg and Zetia 10 mg for his  hyperlipidemia.  He has noticed some episodes of mild chest discomfort.  He denied recurrent SVT and is asked he feels this was anxiety mediated and he had been placed on low-dose Klonopin by his primary provider.   His only, he has remained stable.  His palpitations are less since he has been taking Klonopin.  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  He has had urinary issues and apparently has dark urine.  He is being evaluated by Dr. Vikki Ports of urology and may require bladder sphincter surgery.  He has undergone urinalysis.  He was told perhaps that some of his dark urine may read present a metabolic issue.  He has a history of prostatectomy and radiation treatment.  He admits to significant myalgias and has been on atorvastatin 20 mg and Zetia 10 mg for hyperlipidemia.[name]  He is  on  aspirin/Plavix for DAPT and amlodipine 5 mg, isosorbide 15 mg twice a day, metoprolol succinate 25 mg and losartan 12.5 mg for hypertension/CAD.  He denies any recent anginal symptoms.  He presents for evaluation   Past Medical History:  Diagnosis Date  . Arthritis   . Coronary artery disease    2 stents  . Diverticulosis   . Hard of hearing   . Hypertension    hx of elevation on lyrica, off med and now normal   . Wears glasses     Past Surgical History:  Procedure Laterality Date  . EYE SURGERY  02/2020   bilaterl cataracts  . PROSTATECTOMY    . TOTAL HIP ARTHROPLASTY Left 11/10/2015   Procedure: LEFT TOTAL HIP ARTHROPLASTY ANTERIOR APPROACH;  Surgeon: Mcarthur Rossetti, MD;  Location: [place];  Service: Orthopedics;  Laterality: Left;    Current Medications: Outpatient Medications Prior to Visit  Medication Sig Dispense Refill  . amLODipine (NORVASC) 5 MG tablet Take 1 tablet (5 mg total) by mouth daily. 90 tablet 3  . aspirin 81 MG EC tablet Take 81 mg by mouth daily.     . clonazePAM (KLONOPIN) 0.5 MG tablet Take 1 tablet (0.5 mg total) by mouth 3 (three) times daily as needed for  anxiety. 90 tablet 3  . clopidogrel (PLAVIX) 75 MG tablet Take 1 tablet (75 mg total) by mouth daily. 90 tablet 3  . Eszopiclone 3 MG TABS Take 1 tablet (3 mg total) by mouth at bedtime. 30 tablet 3  . ezetimibe (ZETIA) 10 MG tablet Take 1 tablet (10 mg total) by mouth daily. 90 tablet 3  . isosorbide mononitrate (IMDUR) 30 MG 24 hr tablet Take 15 mg by mouth in the morning and at bedtime.     . metoprolol succinate (TOPROL XL) 25 MG 24 hr tablet Take 1 tablet (25 mg total) by mouth daily. 90 tablet 3  . Omega-3 Fatty Acids (FISH OIL)  1000 MG CAPS Take 1,000 mg by mouth daily.     [name] atorvastatin (LIPITOR) 20 MG tablet Take 1 tablet (20 mg total) by mouth daily at 6 PM. 90 tablet 3  . losartan (COZAAR) 25 MG tablet Take 0.5 tablets (12.5 mg total) by mouth daily. 45 tablet 3  . nitroGLYCERIN (NITROSTAT) 0.4 MG SL tablet Place 1 tablet (0.4 mg total) under the tongue every 5 (five) minutes as needed for chest pain. 25 tablet 2  . cefUROXime (CEFTIN) 500 MG tablet Take 1 tablet (500 mg total) by mouth 2 (two) times daily with a meal. 20 tablet 0   No facility-administered medications prior to visit.     Allergies:   Ranolazine   Social History   Socioeconomic History  . Marital status: Married    Spouse name: Not on file  . Number of children: 0  . Years of education: Not on file  . Highest education level: Not on file  Occupational History  . Occupation: Employed at [employer]  Tobacco Use  . Smoking status: Never Smoker  . Smokeless tobacco: Never Used  Vaping Use  . Vaping Use: Former  Substance and Sexual Activity  . Alcohol use: Not Currently    Alcohol/week: 0.0 standard drinks    Comment: rarely  . Drug use: No  . Sexual activity: Yes    Partners: Female    Birth control/protection: None  Other Topics Concern  . Not on file  Social History Narrative  . Not on file   Social Determinants of Health   Financial Resource Strain: Not on file  Food Insecurity: Not on  file  Transportation Needs: Not on file  Physical Activity: Not on file  Stress: Not on file  Social Connections: Not on file     Family History:  The patient's family history includes Alzheimer's disease in his mother; Cancer in his maternal uncle and maternal uncle; Colon polyps in his brother; Diabetes in his sister; Heart attack in his maternal grandmother; Prostate cancer in his maternal uncle and maternal uncle; Stroke in his father.   ROS General: Negative; No fevers, chills, or night sweats;  HEENT: Negative; No changes in vision or hearing, sinus congestion, difficulty swallowing Pulmonary: Negative; No cough, wheezing, shortness of breath, hemoptysis Cardiovascular: Negative; No chest pain, presyncope, syncope, palpitations GI: Negative; No nausea, vomiting, diarrhea, or abdominal pain GU: h/o prostatectomy, radiation treatment.  Need for bladder sphincter surgery.  Complains of dark urine. Musculoskeletal: Myalgias Hematologic/Oncology: Negative; no easy bruising, bleeding Endocrine: Negative; no heat/cold intolerance; no diabetes Neuro: Negative; no changes in balance, headaches Skin: Negative; No rashes or skin lesions Psychiatric: Negative; No behavioral problems, depression Sleep: Negative; No snoring, daytime sleepiness, hypersomnolence, bruxism, restless legs, hypnogognic hallucinations, no cataplexy Other comprehensive 14 point system review is negative.   PHYSICAL EXAM:   VS:  BP 130/80 (BP Location: Left Arm, Patient Position: Sitting)   Pulse 62   Ht 5' 11"  (1.803 m)   Wt 228 lb (103.4 kg)   SpO2 96%   BMI 31.80 kg/m     Repeat blood pressure by me 124/78  Wt Readings from Last 3 Encounters:  04/01/20 228 lb (103.4 kg)  03/16/20 222 lb 9.6 oz (101 kg)  02/03/20 230 lb (104.3 kg)    General: Alert, oriented, no distress.  Skin: normal turgor, no rashes, warm and dry HEENT: Normocephalic, atraumatic. Pupils equal round and reactive to light; sclera  anicteric; extraocular muscles intact;  Nose without nasal septal  hypertrophy Mouth/Parynx benign; Mallinpatti scale 3 Neck: Thick neck; no JVD, no carotid bruits; normal carotid upstroke Lungs: clear to ausculatation and percussion; no wheezing or rales Chest wall: without tenderness to palpitation Heart: PMI not displaced, RRR, s1 s2 normal, 1/6 systolic murmur, no diastolic murmur, no rubs, gallops, thrills, or heaves Abdomen: soft, nontender; no hepatosplenomehaly, BS+; abdominal aorta nontender and not dilated by palpation. Back: no CVA tenderness Pulses 2+ Musculoskeletal: full range of motion, normal strength, no joint deformities Extremities: no clubbing cyanosis or edema, Homan's sign negative  Neurologic: grossly nonfocal; Cranial nerves grossly wnl Psychologic: Normal mood and affect   Studies/Labs Reviewed:   EKG:  EKG is ordered today.  ECG (independently read by me): NSR at 62; LVH, small inferior Q waves  September 2021 ECG (independently read by me):Normal sinus rhythm at 61 bpm.  Normal intervals.  No ectopy.  April 2021 ECG (independently read by me): Sinus bradycardia 54 bpm.  Small Q-wave in lead III.  No ectopy.  Nonspecific ST changes.  Recent Labs: BMP Latest Ref Rng & Units 04/01/2020 09/13/2019 06/11/2019  Glucose 65 - 99 mg/dL 80 84 113(H)  BUN 8 - 27 mg/dL 12 11 15   Creatinine 0.76 - 1.27 mg/dL 1.19 1.11 1.08  BUN/Creat Ratio 10 - 24 10 10  -  Sodium 134 - 144 mmol/L 140 141 138  Potassium 3.5 - 5.2 mmol/L 5.1 5.1 4.0  Chloride 96 - 106 mmol/L 104 107(H) 106  CO2 20 - 29 mmol/L 22 20 20(L)  Calcium 8.6 - 10.2 mg/dL 9.4 9.4 9.2     Hepatic Function Latest Ref Rng & Units 04/01/2020 09/13/2019 06/11/2019  Total Protein 6.0 - 8.5 g/dL 7.1 7.2 7.3  Albumin 3.8 - 4.8 g/dL 4.6 4.4 3.9  AST 0 - 40 IU/L 34 27 25  ALT 0 - 44 IU/L 50(H) 35 34  Alk Phosphatase 44 - 121 IU/L 82 72 84  Total Bilirubin 0.0 - 1.2 mg/dL 0.5 0.5 1.3(H)  Bilirubin, Direct 0.0 - 0.2 mg/dL -  - -    CBC Latest Ref Rng & Units 04/01/2020 06/11/2019 06/06/2019  WBC 3.4 - 10.8 x10E3/uL 8.6 6.9 6.4  Hemoglobin 13.0 - 17.7 g/dL 18.4(H) 15.5 14.0  Hematocrit 37.5 - 51.0 % 54.9(H) 45.9 41.7  Platelets 150 - 450 x10E3/uL 219 219 208   Lab Results  Component Value Date   MCV 87 04/01/2020   MCV 91.4 06/11/2019   MCV 90.1 06/06/2019   No results found for: TSH Lab Results  Component Value Date   HGBA1C 5.8 (H) 11/28/2019     BNP    Component Value Date/Time   BNP 67.2 06/05/2019 2231    ProBNP No results found for: PROBNP   Lipid Panel     Component Value Date/Time   CHOL 108 09/13/2019 1123   TRIG 119 09/13/2019 1123   HDL 28 (L) 09/13/2019 1123   CHOLHDL 3.9 09/13/2019 1123   CHOLHDL 4.8 06/06/2019 0612   VLDL 29 06/06/2019 0612   LDLCALC 58 09/13/2019 1123   LABVLDL 22 09/13/2019 1123     RADIOLOGY: No results found.   Additional studies/ records that were reviewed today include:  I reviewed the patient's most recent hospitalization, and while in the hospital was able to review his outside angiographic films.    ASSESSMENT:    1. Coronary artery disease involving native coronary artery of native heart with angina pectoris ([place])   2. Essential hypertension   3. Hyperlipidemia LDL goal <70  4. Paroxysmal SVT (supraventricular tachycardia) (HCC)   5. Medication management   6. History of prostate cancer   7. Anxiety     PLAN:  Jacob Collier is a 65 year old gentleman who has known CAD and underwent initial PCI to his RCA in October 2020 at which time he also had known CTO of his circumflex.  He has a history of hypertension and hyperlipidemia and subsequently developed CTO of his RCA which upon angiographic review was not amenable for attempt at intervention.  He has been documented to have SVT which has improved with initiation of metoprolol succinate.  He believes his episodes have been anxiety mediated and since he had been placed on low-dose Klonopin he  is not experiencing any tachydysrhythmia.  Presently, he is not having any anginal symptomatology and continues to be on amlodipine 5 mg, isosorbide which he is taking 50 mg in the morning and at night, metoprolol succinate 25 mg and losartan 12.5 mg daily.  He continues to be on DAPT.  Remotely there was a question of possible Ranexa intolerance.  He states he has been having some difficulty with his muscles.  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  He has also noticed his urine turning dark.  Apparently he has had issues with recurrent UTIs and has been followed by Dr. Vikki Ports.  I am recommending laboratory be checked including a comprehensive metabolic panel, CBC, and I will also check a CPK, ESR, as well as aldolase.  Since he does not believe he is tolerating atorvastatin well I will discontinue this and try rosuvastatin 10 mg every other day with Zetia 10 mg to hopefully achieve and target LDL less than 70.  In July 2021 on his prior therapy LDL was 58.   Repeat laboratory will be obtained in 3 months and I will see him in 4 months for reevaluation.   Medication Adjustments/Labs and Tests Ordered: Current medicines are reviewed at length with the patient today.  Concerns regarding medicines are outlined above.  Medication changes, Labs and Tests ordered today are listed in the Patient Instructions below. Patient Instructions  Medication Instructions:  STOP ATORVASTATIN  START ROSUVASTATIN 10MG EVERY-OTHER-DAY *If you need a refill on your cardiac medications before your next appointment, please call your pharmacy*  Lab Work: CMET, CBC, SED RATE AND AMYLASE TODAY  FASTING LIPID AND CMET IN 3-4 MONTHS  If you have labs (blood work) drawn today and your tests are completely normal, you will receive your results only by:  [place] (if you have MyChart) OR A paper copy in the mail.  If you have any lab test that is abnormal or we need to change your  treatment, we will call you to review the results. You may go to any [facility] that is convenient for you however, we do have a lab in our office that is able to assist you. You DO NOT need an appointment for our lab. The lab is open 8:00am and closes at 4:00pm. Lunch 12:45 - 1:45pm.  Follow-Up: Your next appointment:  4 month(s) In Person with [name], MD  At [facility], you and your health needs are our priority.  As part of our continuing mission to provide you with exceptional heart care, we have created designated Provider Care Teams.  These Care Teams include your primary Cardiologist (physician) and Advanced Practice Providers (APPs -  Physician Assistants and Nurse Practitioners) who all work together to provide you with the  care you need, when you need it.  We recommend signing up for the patient portal called "MyChart".  Sign up information is provided on this After Visit Summary.  MyChart is used to connect with patients for Virtual Visits (Telemedicine).  Patients are able to view lab/test results, encounter notes, upcoming appointments, etc.  Non-urgent messages can be sent to your provider as well.   To learn more about what you can do with MyChart, go to [email or web address].       Signed, [name], MD  04/02/2020 11:34 AM    [facility] [address], [place], [place],   [address] Phone: [phone]  8.I did okay

## 2020-04-01 NOTE — Patient Instructions (Signed)
Medication Instructions:  STOP ATORVASTATIN  START ROSUVASTATIN 10MG  EVERY-OTHER-DAY *If you need a refill on your cardiac medications before your next appointment, please call your pharmacy*  Lab Work: CMET, CBC, SED RATE AND AMYLASE TODAY  FASTING LIPID AND CMET IN 3-4 MONTHS  If you have labs (blood work) drawn today and your tests are completely normal, you will receive your results only by:  [place] (if you have MyChart) OR A paper copy in the mail.  If you have any lab test that is abnormal or we need to change your treatment, we will call you to review the results. You may go to any [facility] that is convenient for you however, we do have a lab in our office that is able to assist you. You DO NOT need an appointment for our lab. The lab is open 8:00am and closes at 4:00pm. Lunch 12:45 - 1:45pm.  Follow-Up: Your next appointment:  4 month(s) In Person with [name], MD  At [facility], you and your health needs are our priority.  As part of our continuing mission to provide you with exceptional heart care, we have created designated Provider Care Teams.  These Care Teams include your primary Cardiologist (physician) and Advanced Practice Providers (APPs -  Physician Assistants and Nurse Practitioners) who all work together to provide you with the care you need, when you need it.  We recommend signing up for the patient portal called "MyChart".  Sign up information is provided on this After Visit Summary.  MyChart is used to connect with patients for Virtual Visits (Telemedicine).  Patients are able to view lab/test results, encounter notes, upcoming appointments, etc.  Non-urgent messages can be sent to your provider as well.   To learn more about what you can do with MyChart, go to [email or web address].

## 2020-04-02 ENCOUNTER — Encounter: Payer: Self-pay | Admitting: Cardiovascular Disease

## 2020-04-02 LAB — CBC
Hematocrit: 54.9 % — ABNORMAL HIGH (ref 37.5–51.0)
Hemoglobin: 18.4 g/dL — ABNORMAL HIGH (ref 13.0–17.7)
MCH: 29 pg (ref 26.6–33.0)
MCHC: 33.5 g/dL (ref 31.5–35.7)
MCV: 87 fL (ref 79–97)
Platelets: 219 10*3/uL (ref 150–450)
RBC: 6.35 x10E6/uL — ABNORMAL HIGH (ref 4.14–5.80)
RDW: 13.2 % (ref 11.6–15.4)
WBC: 8.6 10*3/uL (ref 3.4–10.8)

## 2020-04-02 LAB — COMPREHENSIVE METABOLIC PANEL
ALT: 50 IU/L — ABNORMAL HIGH (ref 0–44)
AST: 34 IU/L (ref 0–40)
Albumin/Globulin Ratio: 1.8 (ref 1.2–2.2)
Albumin: 4.6 g/dL (ref 3.8–4.8)
Alkaline Phosphatase: 82 IU/L (ref 44–121)
BUN/Creatinine Ratio: 10 (ref 10–24)
BUN: 12 mg/dL (ref 8–27)
Bilirubin Total: 0.5 mg/dL (ref 0.0–1.2)
CO2: 22 mmol/L (ref 20–29)
Calcium: 9.4 mg/dL (ref 8.6–10.2)
Chloride: 104 mmol/L (ref 96–106)
Creatinine, Ser: 1.19 mg/dL (ref 0.76–1.27)
GFR calc Af Amer: 73 mL/min/{1.73_m2} (ref >59)
GFR calc non Af Amer: 63 mL/min/{1.73_m2} (ref >59)
Globulin, Total: 2.5 g/dL (ref 1.5–4.5)
Glucose: 80 mg/dL (ref 65–99)
Potassium: 5.1 mmol/L (ref 3.5–5.2)
Sodium: 140 mmol/L (ref 134–144)
Total Protein: 7.1 g/dL (ref 6.0–8.5)

## 2020-04-02 LAB — SEDIMENTATION RATE: Sed Rate: 9 mm/hr (ref 0–30)

## 2020-04-02 LAB — AMYLASE: Amylase: 53 U/L (ref 31–110)

## 2020-04-10 ENCOUNTER — Telehealth: Payer: Self-pay | Admitting: Cardiovascular Disease

## 2020-04-10 ENCOUNTER — Encounter: Payer: Self-pay | Admitting: *Deleted

## 2020-04-10 NOTE — Telephone Encounter (Signed)
Patient is returning call to discuss lab results. 

## 2020-04-10 NOTE — Telephone Encounter (Signed)
Left message of results for pt. Letter of results sent to pt

## 2020-04-19 ENCOUNTER — Other Ambulatory Visit: Payer: Self-pay | Admitting: Cardiovascular Disease

## 2020-04-23 ENCOUNTER — Ambulatory Visit: Payer: Managed Care, Other (non HMO) | Admitting: Nurse Practitioner

## 2020-04-23 ENCOUNTER — Other Ambulatory Visit: Payer: Self-pay

## 2020-04-23 ENCOUNTER — Ambulatory Visit: Payer: Managed Care, Other (non HMO) | Admitting: Legal Medicine

## 2020-04-23 ENCOUNTER — Encounter: Payer: Self-pay | Admitting: Nurse Practitioner

## 2020-04-23 VITALS — BP 128/72 | HR 65 | Temp 98.0°F | Ht 71.0 in | Wt 226.0 lb

## 2020-04-23 DIAGNOSIS — M722 Plantar fascial fibromatosis: Secondary | ICD-10-CM

## 2020-04-23 MED ORDER — TRIAMCINOLONE ACETONIDE 10 MG/ML IJ SUSP
10.0000 mg | Freq: Once | INTRAMUSCULAR | Status: DC
Start: 2020-04-23 — End: 2020-04-24
  Administered 2020-04-23: 10 mg via INTRA_ARTICULAR

## 2020-04-23 NOTE — Progress Notes (Signed)
Acute Office Visit  Subjective:    Patient ID: Jacob Collier, male    DOB: September 21, 1955, 65 y.o.   MRN: [id number]  Chief Complaint  Patient presents with  . Left foot pain    HPI Jacob Collier is a 65 year old Caucasian male  in today for left foot plantar aspect heel pain. States pain has become gradually worse over time. States he spends long hours walking on concrete in work boots. Treatment includes Dr Felicie Morn heel inserts for plantar fasciitis. States improved pain minimally for a couple days. Walking and standing aggravates pain. Sitting and elevating feet alleviates pain. Pain is worse first in the morning when stepping out of bed. He has requested steroid injection from PCP.    Past Medical History:  Diagnosis Date  . Arthritis   . Coronary artery disease    2 stents  . Diverticulosis   . Hard of hearing   . Hypertension    hx of elevation on lyrica, off med and now normal   . Wears glasses     Past Surgical History:  Procedure Laterality Date  . EYE SURGERY  02/2020   bilaterl cataracts  . PROSTATECTOMY    . TOTAL HIP ARTHROPLASTY Left 11/10/2015   Procedure: LEFT TOTAL HIP ARTHROPLASTY ANTERIOR APPROACH;  Surgeon: Mcarthur Rossetti, MD;  Location: [facility];  Service: Orthopedics;  Laterality: Left;    Family History  Problem Relation Age of Onset  . Alzheimer's disease Mother   . Stroke Father   . Diabetes Sister   . Heart attack Maternal Grandmother   . Cancer Maternal Uncle        prostate  . Prostate cancer Maternal Uncle   . Cancer Maternal Uncle        prostate  . Prostate cancer Maternal Uncle   . Colon polyps Brother   . Colon cancer Neg Hx   . Esophageal cancer Neg Hx   . Rectal cancer Neg Hx   . Stomach cancer Neg Hx     Social History   Socioeconomic History  . Marital status: Married    Spouse name: Not on file  . Number of children: 0  . Years of education: Not on file  . Highest education level: Not on file  Occupational History  .  Occupation: Employed at [employer]  Tobacco Use  . Smoking status: Never Smoker  . Smokeless tobacco: Never Used  Vaping Use  . Vaping Use: Former  Substance and Sexual Activity  . Alcohol use: Not Currently    Alcohol/week: 0.0 standard drinks    Comment: rarely  . Drug use: No  . Sexual activity: Yes    Partners: Female    Birth control/protection: None  Other Topics Concern  . Not on file  Social History Narrative  . Not on file   Social Determinants of Health   Financial Resource Strain: Not on file  Food Insecurity: Not on file  Transportation Needs: Not on file  Physical Activity: Not on file  Stress: Not on file  Social Connections: Not on file  Intimate Partner Violence: Not on file    Outpatient Medications Prior to Visit  Medication Sig Dispense Refill  . amLODipine (NORVASC) 2.5 MG tablet Take 2.5 mg by mouth daily.    [name] aspirin 81 MG EC tablet Take 81 mg by mouth daily.     . clonazePAM (KLONOPIN) 0.5 MG tablet Take 1 tablet (0.5 mg total) by mouth 3 (three) times daily as needed for  anxiety. 90 tablet 3  . clopidogrel (PLAVIX) 75 MG tablet Take 1 tablet (75 mg total) by mouth daily. 90 tablet 3  . Eszopiclone 3 MG TABS Take 1 tablet (3 mg total) by mouth at bedtime. 30 tablet 3  . ezetimibe (ZETIA) 10 MG tablet TAKE 1 TABLET BY MOUTH EVERY DAY 90 tablet 3  . isosorbide mononitrate (IMDUR) 30 MG 24 hr tablet Take 15 mg by mouth in the morning and at bedtime.     [name] losartan (COZAAR) 25 MG tablet Take 0.5 tablets (12.5 mg total) by mouth daily. 45 tablet 3  . metoprolol succinate (TOPROL XL) 25 MG 24 hr tablet Take 1 tablet (25 mg total) by mouth daily. 90 tablet 3  . nitroGLYCERIN (NITROSTAT) 0.4 MG SL tablet Place 1 tablet (0.4 mg total) under the tongue every 5 (five) minutes as needed for chest pain. 25 tablet 2  . Omega-3 Fatty Acids (FISH OIL) 1000 MG CAPS Take 1,000 mg by mouth daily.     . pantoprazole (PROTONIX) 40 MG tablet Take 40 mg by mouth daily.     . rosuvastatin (CRESTOR) 10 MG tablet Take 1 tablet (10 mg total) by mouth every other day. 15 tablet 6  . amLODipine (NORVASC) 5 MG tablet Take 1 tablet (5 mg total) by mouth daily. 90 tablet 3   No facility-administered medications prior to visit.    Allergies  Allergen Reactions  . Ranolazine     Other reaction(s): Other (See Comments) Chest discomfort/Reflux    Review of Systems  Constitutional: Negative for fatigue and fever.  HENT: Negative for congestion, ear pain, sinus pressure and sore throat.   Eyes: Negative for pain.  Respiratory: Negative for cough, chest tightness, shortness of breath and wheezing.   Cardiovascular: Negative for chest pain and palpitations.  Gastrointestinal: Negative for abdominal pain, constipation, diarrhea, nausea and vomiting.  Genitourinary: Negative for dysuria and hematuria.  Musculoskeletal: Positive for myalgias (Left foot pain). Negative for arthralgias, back pain and joint swelling.  Skin: Negative for rash.  Neurological: Negative for dizziness, weakness and headaches.  Psychiatric/Behavioral: Negative for dysphoric mood. The patient is not nervous/anxious.        Objective:    Physical Exam Constitutional:      Appearance: Normal appearance.  HENT:     Right Ear: Tympanic membrane and external ear normal.     Left Ear: Tympanic membrane and external ear normal.     Nose: Nose normal.     Mouth/Throat:     Mouth: Mucous membranes are moist.  Eyes:     Pupils: Pupils are equal, round, and reactive to light.  Cardiovascular:     Rate and Rhythm: Normal rate and regular rhythm.     Pulses: Normal pulses.     Heart sounds: Normal heart sounds.  Pulmonary:     Breath sounds: Normal breath sounds.  Abdominal:     General: Abdomen is flat. Bowel sounds are normal.     Palpations: Abdomen is soft.  Musculoskeletal:        General: Normal range of motion.     Cervical back: Normal range of motion.  Skin:    General: Skin is  warm and dry.  Neurological:     Mental Status: He is alert and oriented to person, place, and time.  Psychiatric:        Mood and Affect: Mood normal.        Behavior: Behavior normal.     BP 128/72 (BP  Location: Left Arm, Patient Position: Sitting)   Pulse 65   Temp 98 F (36.7 C) (Temporal)   Ht 5\' 11"  (1.803 m)   Wt 226 lb (102.5 kg)   SpO2 96%   BMI 31.52 kg/m  Wt Readings from Last 3 Encounters:  04/23/20 226 lb (102.5 kg)  04/01/20 228 lb (103.4 kg)  03/16/20 222 lb 9.6 oz (101 kg)    Health Maintenance Due  Topic Date Due  . Hepatitis C Screening  Never done  . TETANUS/TDAP  Never done  . PNA vac Low Risk Adult (1 of 2 - PCV13) Never done  . COVID-19 Vaccine (2 - Booster for Janssen series) 09/04/2019        Lab Results  Component Value Date   WBC 8.6 04/01/2020   HGB 18.4 (H) 04/01/2020   HCT 54.9 (H) 04/01/2020   MCV 87 04/01/2020   PLT 219 04/01/2020   Lab Results  Component Value Date   NA 140 04/01/2020   K 5.1 04/01/2020   CO2 22 04/01/2020   GLUCOSE 80 04/01/2020   BUN 12 04/01/2020   CREATININE 1.19 04/01/2020   BILITOT 0.5 04/01/2020   ALKPHOS 82 04/01/2020   AST 34 04/01/2020   ALT 50 (H) 04/01/2020   PROT 7.1 04/01/2020   ALBUMIN 4.6 04/01/2020   CALCIUM 9.4 04/01/2020   ANIONGAP 12 06/11/2019   Lab Results  Component Value Date   CHOL 108 09/13/2019   Lab Results  Component Value Date   HDL 28 (L) 09/13/2019   Lab Results  Component Value Date   LDLCALC 58 09/13/2019   Lab Results  Component Value Date   TRIG 119 09/13/2019   Lab Results  Component Value Date   CHOLHDL 3.9 09/13/2019   Lab Results  Component Value Date   HGBA1C 5.8 (H) 11/28/2019         Assessment & Plan:   1. Plantar fasciitis of left foot - triamcinolone acetonide (KENALOG) 40 MG/ML injection 40 mg -left heel 1 ml Lidocaine 1% and Kenalog 40 mg/ml injected by Dr Henrene Pastor  Rest and elevated left foot Perform left foot plantar exercises  as directed Continue to wear good support work boots and change inserts frequently Notify office if no improvement or symptoms worsen   Follow up: as needed  Signed,  Jerrell Belfast, DNP

## 2020-04-23 NOTE — Patient Instructions (Signed)
Plantar Fasciitis  Plantar fasciitis is a painful foot condition that affects the heel. It occurs when the band of tissue that connects the toes to the heel bone (plantar fascia) becomes irritated. This can happen as the result of exercising too much or doing other repetitive activities (overuse injury). Plantar fasciitis can cause mild irritation to severe pain that makes it difficult to walk or move. The pain is usually worse in the morning after sleeping, or after sitting or lying down for a period of time. Pain may also be worse after long periods of walking or standing. What are the causes? This condition may be caused by:  Standing for long periods of time.  Wearing shoes that do not have good arch support.  Doing activities that put stress on joints (high-impact activities). This includes ballet and exercise that makes your heart beat faster (aerobic exercise), such as running.  Being overweight.  An abnormal way of walking (gait).  Tight muscles in the back of your lower leg (calf).  High arches in your feet or flat feet.  Starting a new athletic activity. What are the signs or symptoms? The main symptom of this condition is heel pain. Pain may get worse after the following:  Taking the first steps after a time of rest, especially in the morning after awakening, or after you have been sitting or lying down for a while.  Long periods of standing still. Pain may decrease after 30-45 minutes of activity, such as gentle walking. How is this diagnosed? This condition may be diagnosed based on your medical history, a physical exam, and your symptoms. Your health care provider will check for:  A tender area on the bottom of your foot.  A high arch in your foot or flat feet.  Pain when you move your foot.  Difficulty moving your foot. You may have imaging tests to confirm the diagnosis, such as:  X-rays.  Ultrasound.  MRI. How is this treated? Treatment for plantar  fasciitis depends on how severe your condition is. Treatment may include:  Rest, ice, pressure (compression), and raising (elevating) the affected foot. This is called RICE therapy. Your health care provider may recommend RICE therapy along with over-the-counter pain medicines to manage your pain.  Exercises to stretch your calves and your plantar fascia.  A splint that holds your foot in a stretched, upward position while you sleep (night splint).  Physical therapy to relieve symptoms and prevent problems in the future.  Injections of steroid medicine (cortisone) to relieve pain and inflammation.  Stimulating your plantar fascia with electrical impulses (extracorporeal shock wave therapy). This is usually the last treatment option before surgery.  Surgery, if other treatments have not worked after 12 months. Follow these instructions at home: Managing pain, stiffness, and swelling  If directed, put ice on the painful area. To do this: ? Put ice in a plastic bag, or use a frozen bottle of water. ? Place a towel between your skin and the bag or bottle. ? Roll the bottom of your foot over the bag or bottle. ? Do this for 20 minutes, 2-3 times a day.  Wear athletic shoes that have air-sole or gel-sole cushions, or try soft shoe inserts that are designed for plantar fasciitis.  Elevate your foot above the level of your heart while you are sitting or lying down.   Activity  Avoid activities that cause pain. Ask your health care provider what activities are safe for you.  Do physical therapy  exercises and stretches as told by your health care provider.  Try activities and forms of exercise that are easier on your joints (low impact). Examples include swimming, water aerobics, and biking. General instructions  Take over-the-counter and prescription medicines only as told by your health care provider.  Wear a night splint while sleeping, if told by your health care provider. Loosen the  splint if your toes tingle, become numb, or turn cold and blue.  Maintain a healthy weight, or work with your health care provider to lose weight as needed.  Keep all follow-up visits. This is important. Contact a health care provider if you have:  Symptoms that do not go away with home treatment.  Pain that gets worse.  Pain that affects your ability to move or do daily activities. Summary  Plantar fasciitis is a painful foot condition that affects the heel. It occurs when the band of tissue that connects the toes to the heel bone (plantar fascia) becomes irritated.  Heel pain is the main symptom of this condition. It may get worse after exercising too much or standing still for a long time.  Treatment varies, but it usually starts with rest, ice, pressure (compression), and raising (elevating) the affected foot. This is called RICE therapy. Over-the-counter medicines can also be used to manage pain. This information is not intended to replace advice given to you by your health care provider. Make sure you discuss any questions you have with your health care provider. Document Revised: 06/10/2019 Document Reviewed: 06/10/2019 Elsevier Patient Education  2021 [place]. Triamcinolone injection What is this medicine? TRIAMCINOLONE (trye am SIN oh lone) is a corticosteroid. It helps to reduce swelling, redness, itching, and allergic reactions. This medicine is used to treat allergies, arthritis, asthma, skin problems, and many other conditions. This medicine may be used for other purposes; ask your health care provider or pharmacist if you have questions. COMMON BRAND NAME(S): Aristocort, Aristocort Forte, Aristospan, Arze-Ject-A, Hexatrione, Kenalog, Tac-3, Triamonide, Triesence, XIPERE What should I tell my health care provider before I take this medicine? They need to know if you have any of these conditions:  Cushing's syndrome  diabetes  glaucoma  heart disease  high  blood pressure  infection, like tuberculosis, herpes, measles, chickenpox, or fungal infection  liver disease  low levels of potassium in the blood  mental illness  myasthenia gravis  osteoporosis  recent heart attack  seizures  stomach or intestine disease  thyroid disease  an unusual or allergic reaction to triamcinolone, corticosteroids, benzyl alcohol, other medicines, foods, dyes, or preservatives  pregnant or trying to get pregnant  breast-feeding How should I use this medicine? This medicine is injected by a health care professional. After your dose follow your doctor's instructions for your care. Contact your pediatrician regarding the use of this medicine in children. Special care may be needed. Overdosage: If you think you have taken too much of this medicine contact a poison control center or emergency room at once. NOTE: This medicine is only for you. Do not share this medicine with others. What if I miss a dose? This does not apply. What may interact with this medicine?  antiviral medicines for HIV or AIDS  aspirin  certain medicines for fungal infections like ketoconazole and itraconazole  clarithromycin  mifepristone  nefazodone  other steroid medicines  vaccines and other immunization products This list may not describe all possible interactions. Give your health care provider a list of all the medicines, herbs, non-prescription drugs, or  dietary supplements you use. Also tell them if you smoke, drink alcohol, or use illegal drugs. Some items may interact with your medicine. What should I watch for while using this medicine? Visit your doctor or health care professional for regular checks on your progress. If you are taking this medicine for a long time, carry an identification card with your name, the type and dose of medicine, and your doctor's name and address. Do not come in contact with people who have chickenpox or the measles while you are  taking this medicine. If you do, call your doctor right away. This medicine may increase blood sugar. Ask your healthcare provider if changes in diet or medicines are needed if you have diabetes. What side effects may I notice from receiving this medicine? Side effects that you should report to your doctor or health care professional as soon as possible:  allergic reactions like skin rash, itching or hives, swelling of the face, lips, or tongue  black, tarry stools  changes in emotions or moods  eye pain  increased blood pressure  increased joint pain and swelling at site where injected  lumpy, thin skin at site where injected  rounding of face  seizures  signs and symptoms of high blood sugar such as being more thirsty or hungry or having to urinate more than normal. You may also feel very tired or have blurry vision.  signs and symptoms of infection like fever or chills; cough; sore throat; pain or trouble passing urine  slow growth in children (if used for longer periods of time)  sores that do not heal  stomach pain  swelling of ankles, feet, hands  trouble sleeping  unusual bleeding or bruising  unusual increased growth of hair on the face or body Side effects that usually do not require medical attention (report to your doctor or health care professional if they continue or are bothersome):  headache  nausea  pain, redness, or irritation at site where injected  upset stomach  weight gain This list may not describe all possible side effects. Call your doctor for medical advice about side effects. You may report side effects to FDA at 1-800-FDA-1088. Where should I keep my medicine? Keep out of the reach of children. Store at room temperature between 20 and 25 degrees C (68 and 77 degrees F). Do not freeze. Protect from temperatures below 20 degrees C (68 degrees F). Protect from light. Keep in the original container. Store vial upright. Throw away any unused  medicine after the expiration date. NOTE: This sheet is a summary. It may not cover all possible information. If you have questions about this medicine, talk to your doctor, pharmacist, or health care provider.  2021 Elsevier/Gold Standard (2017-11-23 10:55:56) Perform left foot plantar fasciitis exercises Continue wearing silicone heel inserts in work shoes Avoid wearing flat shoes or walking barefoot Follow-up as needed  Plantar Fasciitis Rehab Ask your health care provider which exercises are safe for you. Do exercises exactly as told by your health care provider and adjust them as directed. It is normal to feel mild stretching, pulling, tightness, or discomfort as you do these exercises. Stop right away if you feel sudden pain or your pain gets worse. Do not begin these exercises until told by your health care provider. Stretching and range-of-motion exercises These exercises warm up your muscles and joints and improve the movement and flexibility of your foot. These exercises also help to relieve pain. Plantar fascia stretch 1. Sit with your  left / right leg crossed over your opposite knee. 2. Hold your heel with one hand with that thumb near your arch. With your other hand, hold your toes and gently pull them back toward the top of your foot. You should feel a stretch on the base (bottom) of your toes, or the bottom of your foot (plantar fascia), or both. 3. Hold this stretch for__________ seconds. 4. Slowly release your toes and return to the starting position. Repeat __________ times. Complete this exercise __________ times a day.   Gastrocnemius stretch, standing This exercise is also called a calf (gastroc) stretch. It stretches the muscles in the back of the upper calf. 1. Stand with your hands against a wall. 2. Extend your left / right leg behind you, and bend your front knee slightly. 3. Keeping your heels on the floor, your toes facing forward, and your back knee straight, shift  your weight toward the wall. Do not arch your back. You should feel a gentle stretch in your upper calf. 4. Hold this position for __________ seconds. Repeat __________ times. Complete this exercise __________ times a day.   Soleus stretch, standing This exercise is also called a calf (soleus) stretch. It stretches the muscles in the back of the lower calf. 1. Stand with your hands against a wall. 2. Extend your left / right leg behind you, and bend your front knee slightly. 3. Keeping your heels on the floor and your toes facing forward, bend your back knee and shift your weight slightly over your back leg. You should feel a gentle stretch deep in your lower calf. 4. Hold this position for __________ seconds. Repeat __________ times. Complete this exercise __________ times a day. Gastroc and soleus stretch, standing step This exercise stretches the muscles in the back of the lower leg. These muscles are in the upper calf (gastrocnemius) and the lower calf (soleus). 1. Stand with the ball of your left / right foot on the front of a step. The ball of your foot is on the walking surface, right under your toes. 2. Keep your other foot firmly on the same step. 3. Hold on to the wall or a railing for balance. 4. Slowly lift your other foot, allowing your body weight to press your heel down over the edge of the front of the step. Keep knee straight and unbent. You should feel a stretch in your calf. 5. Hold this position for __________ seconds. 6. Return both feet to the step. 7. Repeat this exercise with a slight bend in your left / right knee. Repeat __________ times with your left / right knee straight and __________ times with your left / right knee bent. Complete this exercise __________ times a day. Balance exercise This exercise builds your balance and strength control of your arch to help take pressure off your plantar fascia. Single leg stand If this exercise is too easy, you can try it  with your eyes closed or while standing on a pillow. 1. Without shoes, stand near a railing or in a doorway. You may hold on to the railing or door frame as needed. 2. Stand on your left / right foot. Keep your big toe down on the floor and lift the arch of your foot. You should feel a stretch across the bottom of your foot and your arch. Do not let your foot roll inward. 3. Hold this position for __________ seconds. Repeat __________ times. Complete this exercise __________ times a day. This information is  not intended to replace advice given to you by your health care provider. Make sure you discuss any questions you have with your health care provider. Document Revised: 12/05/2019 Document Reviewed: 12/05/2019 Elsevier Patient Education  [place].

## 2020-04-24 DIAGNOSIS — M722 Plantar fascial fibromatosis: Secondary | ICD-10-CM | POA: Diagnosis not present

## 2020-04-24 MED ORDER — TRIAMCINOLONE ACETONIDE 40 MG/ML IJ SUSP
40.0000 mg | Freq: Once | INTRAMUSCULAR | Status: AC
Start: 2020-04-24 — End: 2020-04-24
  Administered 2020-04-24: 40 mg via INTRA_ARTICULAR

## 2020-04-29 ENCOUNTER — Other Ambulatory Visit: Payer: Self-pay

## 2020-04-29 ENCOUNTER — Other Ambulatory Visit: Payer: Self-pay | Admitting: Legal Medicine

## 2020-04-29 DIAGNOSIS — F5101 Primary insomnia: Secondary | ICD-10-CM

## 2020-04-29 MED ORDER — ESZOPICLONE 3 MG PO TABS: 3.0000 mg | ORAL_TABLET | Freq: Every day | ORAL | 3 refills | Status: DC

## 2020-04-29 NOTE — Telephone Encounter (Signed)
Patient called and states pharmacy does not have his Lunesta in stock and pharmacy told him they would send a request to the office for him to receive the generic but patient states he has tried the generic and it does not work for him. Patient is wanting Rx sent to another pharmacy and I have checked with [facility] in [facility] and they have it in [place].

## 2020-05-04 ENCOUNTER — Telehealth: Payer: Self-pay

## 2020-05-04 NOTE — Telephone Encounter (Signed)
Pt called states he is having worsening pain within past few days where he had intestinal surgery. Pt rated pain 7/10 on pain scale. States pain is constant, he has trouble laying down. Pt advised to go to ED due to worsening pain and hx. Pt states he will have wife drive him to [facility].   Routing to PCP to make aware.   Royce Macadamia, [place] 05/04/20 10:38 AM

## 2020-05-15 ENCOUNTER — Other Ambulatory Visit: Payer: Self-pay | Admitting: Legal Medicine

## 2020-05-15 DIAGNOSIS — I25110 Atherosclerotic heart disease of native coronary artery with unstable angina pectoris: Secondary | ICD-10-CM

## 2020-05-17 ENCOUNTER — Other Ambulatory Visit: Payer: Self-pay | Admitting: Student

## 2020-05-18 ENCOUNTER — Ambulatory Visit (INDEPENDENT_AMBULATORY_CARE_PROVIDER_SITE_OTHER): Payer: Managed Care, Other (non HMO) | Admitting: Physician Assistant

## 2020-05-18 ENCOUNTER — Telehealth: Payer: Self-pay

## 2020-05-18 ENCOUNTER — Other Ambulatory Visit (INDEPENDENT_AMBULATORY_CARE_PROVIDER_SITE_OTHER): Payer: Managed Care, Other (non HMO)

## 2020-05-18 ENCOUNTER — Encounter: Payer: Self-pay | Admitting: Physician Assistant

## 2020-05-18 VITALS — BP 140/77 | HR 53 | Ht 71.0 in | Wt 220.0 lb

## 2020-05-18 DIAGNOSIS — Z86010 Personal history of colonic polyps: Secondary | ICD-10-CM

## 2020-05-18 DIAGNOSIS — K921 Melena: Secondary | ICD-10-CM

## 2020-05-18 DIAGNOSIS — K56699 Other intestinal obstruction unspecified as to partial versus complete obstruction: Secondary | ICD-10-CM

## 2020-05-18 DIAGNOSIS — Z8546 Personal history of malignant neoplasm of prostate: Secondary | ICD-10-CM

## 2020-05-18 DIAGNOSIS — Z860101 Personal history of adenomatous and serrated colon polyps: Secondary | ICD-10-CM

## 2020-05-18 DIAGNOSIS — I25119 Atherosclerotic heart disease of native coronary artery with unspecified angina pectoris: Secondary | ICD-10-CM

## 2020-05-18 DIAGNOSIS — R109 Unspecified abdominal pain: Secondary | ICD-10-CM

## 2020-05-18 LAB — CBC WITH DIFFERENTIAL/PLATELET
Basophils Absolute: 0.1 10*3/uL (ref 0.0–0.1)
Basophils Relative: 0.8 % (ref 0.0–3.0)
Eosinophils Absolute: 0.2 10*3/uL (ref 0.0–0.7)
Eosinophils Relative: 2.2 % (ref 0.0–5.0)
HCT: 54.5 % — ABNORMAL HIGH (ref 39.0–52.0)
Hemoglobin: 18.4 g/dL (ref 13.0–17.0)
Lymphocytes Relative: 20.4 % (ref 12.0–46.0)
Lymphs Abs: 2 10*3/uL (ref 0.7–4.0)
MCHC: 33.8 g/dL (ref 30.0–36.0)
MCV: 87.4 fl (ref 78.0–100.0)
Monocytes Absolute: 0.7 10*3/uL (ref 0.1–1.0)
Monocytes Relative: 6.7 % (ref 3.0–12.0)
Neutro Abs: 7 10*3/uL (ref 1.4–7.7)
Neutrophils Relative %: 69.9 % (ref 43.0–77.0)
Platelets: 168 10*3/uL (ref 150.0–400.0)
RBC: 6.23 Mil/uL — ABNORMAL HIGH (ref 4.22–5.81)
RDW: 15.7 % — ABNORMAL HIGH (ref 11.5–15.5)
WBC: 10 10*3/uL (ref 4.0–10.5)

## 2020-05-18 MED ORDER — SUTAB 1479-225-188 MG PO TABS: 1.0000 | ORAL_TABLET | ORAL | 0 refills | Status: DC

## 2020-05-18 NOTE — Telephone Encounter (Signed)
[facility] Pre-operative Risk Assessment     Request for surgical clearance:     Endoscopy Procedure  What type of surgery is being performed?     Colonoscopy  When is this surgery scheduled?     07/01/2020  What type of clearance is required ?   Pharmacy  Are there any medications that need to be held prior to surgery and how long? Plavix starting 5 days prior while staying on ASA  Practice name and name of physician performing surgery?      [facility]  What is your office phone and fax number?      Phone- [phone]  [phone]  Anesthesia type (None, local, MAC, general) ?       MAC

## 2020-05-18 NOTE — Progress Notes (Signed)
Subjective:    Patient ID: Jacob Collier, male    DOB: 03-26-1955, 65 y.o.   MRN: [id number]  HPI Jacob Collier is a pleasant 65 year old white male, known to Dr. Hilarie Fredrickson who was last seen in November 2019 when he had colonoscopy.  He comes in today with complaints of mild constipation, intermittent left-sided abdominal pain and concerns for blood on his stool. Patient has prior history of prostate cancer for which he underwent radiation, coronary artery disease status post prior stents, hypertension, and osteoarthritis.  He is maintained on Plavix and aspirin.  PCI to RCA was done in October 2020.  Colonoscopy November 2019 with finding of a 4 mm polyp which was removed and found to be a tubular adenoma, he was noted to have multiple diverticuli in the sigmoid and descending colon with narrowing in the mid sigmoid but no definite stricture and rather severe diverticulosis causing luminal narrowing.  Patient subsequently underwent a sigmoid colectomy in March 2020 per Dr.Koruda at [facility] for sigmoid stricture.  Patient says he has had some mild discomfort off and on ever since the surgery in the left lateral abdomen but over the past 2 to 3 months had noticed a mild increase in discomfort.  He had also started noticing a "stripe on my stool" this was happening consistently for multiple weeks and patient says that this looked like dark blood.  He is worried about his anastomosis and/or some other lesion in his colon which may be causing some intermittent bleeding.  He has been working hard on his diet, increasing fiber, taking his meds with foods etc. and says over the past 2 weeks he has not noticed the stripe in his stool, he has not had any overt melena or hematochezia.  He is having fairly regular normal bowel movements presently. He is worried about malignancy with prior prostate cancer and significant radiation treatments.  Review of Systems Pertinent positive and negative review of systems were noted  in the above HPI section.  All other review of systems was otherwise negative.  Outpatient Encounter Medications as of 05/18/2020  Medication Sig  . amLODipine (NORVASC) 2.5 MG tablet Take 2.5 mg by mouth daily.  [name] aspirin 81 MG EC tablet Take 81 mg by mouth daily.   . clonazePAM (KLONOPIN) 0.5 MG tablet TAKE 1 TABLET (0.5 MG TOTAL) BY MOUTH 3 (THREE) TIMES DAILY AS NEEDED FOR ANXIETY.  . clopidogrel (PLAVIX) 75 MG tablet Take 1 tablet (75 mg total) by mouth daily.  . Eszopiclone 3 MG TABS Take 1 tablet (3 mg total) by mouth at bedtime.  [name] ezetimibe (ZETIA) 10 MG tablet TAKE 1 TABLET BY MOUTH EVERY DAY  . isosorbide mononitrate (IMDUR) 30 MG 24 hr tablet Take 15 mg by mouth in the morning and at bedtime.   [name] losartan (COZAAR) 25 MG tablet Take 0.5 tablets (12.5 mg total) by mouth daily.  . metoprolol succinate (TOPROL XL) 25 MG 24 hr tablet Take 1 tablet (25 mg total) by mouth daily.  . nitroGLYCERIN (NITROSTAT) 0.4 MG SL tablet Place 1 tablet (0.4 mg total) under the tongue every 5 (five) minutes as needed for chest pain.  . Omega-3 Fatty Acids (FISH OIL) 1000 MG CAPS Take 1,000 mg by mouth daily.   . rosuvastatin (CRESTOR) 10 MG tablet Take 1 tablet (10 mg total) by mouth every other day.  . Sodium Sulfate-Mag Sulfate-KCl (SUTAB) [phone] MG TABS Take 1 kit by mouth as directed.  . [DISCONTINUED] pantoprazole (PROTONIX) 40 MG tablet  Take 40 mg by mouth daily. (Patient not taking: Reported on 05/18/2020)   No facility-administered encounter medications on file as of 05/18/2020.   Allergies  Allergen Reactions  . Ranolazine     Other reaction(s): Other (See Comments) Chest discomfort/Reflux   Patient Active Problem List   Diagnosis Date Noted  . Dehydration 03/16/2020  . Bronchial pneumonia 02/03/2020  . Bursitis of left shoulder 11/28/2019  . ED (erectile dysfunction) 10/22/2019  . Sciatica without back pain, left 10/22/2019  . History of total left hip replacement 10/10/2019  .  Primary insomnia 07/05/2019  . Anxiety   . Unstable angina pectoris due to coronary arteriosclerosis ([place]) 05/21/2019  . Allergic rhinitis 05/16/2019  . Hyperlipidemia LDL goal <70 05/16/2019  . H/O heart artery stent 01/18/2019  . Sigmoid stricture ([place]) 04/13/2018  . Other fatigue 07/05/2016  . Osteoarthritis of left hip 11/10/2015  . Status post left hip replacement 11/10/2015  . Diverticulosis 09/24/2014  . Prostate cancer ([place]) 09/24/2014  . Chronic back pain 09/24/2014  . Dyspnea 06/06/2014  . Murmur 06/06/2014   Social History   Socioeconomic History  . Marital status: Married    Spouse name: Not on file  . Number of children: 0  . Years of education: Not on file  . Highest education level: Not on file  Occupational History  . Occupation: Employed at [employer]  Tobacco Use  . Smoking status: Never Smoker  . Smokeless tobacco: Never Used  Vaping Use  . Vaping Use: Former  Substance and Sexual Activity  . Alcohol use: Not Currently    Alcohol/week: 0.0 standard drinks    Comment: rarely  . Drug use: No  . Sexual activity: Yes    Partners: Female    Birth control/protection: None  Other Topics Concern  . Not on file  Social History Narrative  . Not on file   Social Determinants of Health   Financial Resource Strain: Not on file  Food Insecurity: Not on file  Transportation Needs: Not on file  Physical Activity: Not on file  Stress: Not on file  Social Connections: Not on file  Intimate Partner Violence: Not on file    Mr. Wehner family history includes Alzheimer's disease in his mother; Cancer in his maternal uncle and maternal uncle; Colon polyps in his brother; Diabetes in his sister; Heart attack in his maternal grandmother; Prostate cancer in his maternal uncle and maternal uncle; Stroke in his father.      Objective:    Vitals:   05/18/20 0957  BP: 140/77  Pulse: (!) 53  SpO2: 96%    Physical Exam Well-developed well-nourished older WM   in no acute distress.  Height, Weight,220  BMI 30.6  HEENT; nontraumatic normocephalic, EOMI, PE R LA, sclera anicteric. Oropharynx; not examined today Neck; supple, no JVD Cardiovascular; regular rate and rhythm with S1-S2, no murmur rub or gallop Pulmonary; Clear bilaterally Abdomen; soft, minimally tender very laterally in the mid left flank, nondistended, no palpable mass or hepatosplenomegaly, midline incisional scar, bowel sounds are active Rectal; not done today Skin; benign exam, no jaundice rash or appreciable lesions Extremities; no clubbing cyanosis or edema skin warm and dry Neuro/Psych; alert and oriented x4, grossly nonfocal mood and affect appropriate       Assessment & Plan:   #44 65 year old white male with history of adenomatous colon polyps, diverticulosis, and sigmoid stricture for which he underwent sigmoid colectomy in March 2020 who now presents with intermittent left-sided abdominal pain over the  past few months and concerns for blood on his stool, relating a "striped appearance" which had been consistent over multiple weeks. This is in the setting of chronic aspirin and Plavix.  Rule out occult colon neoplasm, rule out anastomotic ulceration, rule out AVM, rule out hemorrhoidal  #2 coronary artery disease status post stents, last intervention October 2020 3.  Chronic antiplatelet therapy-on aspirin and Plavix 4.  Osteoarthritis 5.  Hypertension 6.  History of prostate CA status post radiation therapy  Plan; CBC today Patient will be scheduled for colonoscopy with Dr. Hilarie Fredrickson.  Procedure was discussed in detail with the patient including indications risks and benefits and he is agreeable to proceed. Patient will need to stop Plavix for 5 days prior to procedure.  We will communicate with Dr. [name] his cardiologist to assure this is reasonable for this patient.  He will continue baby aspirin. Patient was advised to call should he have any evidence of more overt  bleeding in the interim until colonoscopy is done.  Jacob Collier Genia Harold PA-C 05/18/2020   Cc: [name],*

## 2020-05-18 NOTE — Patient Instructions (Signed)
If you are age 67 or older, your body mass index should be between 23-30. Your Body mass index is 30.68 kg/m. If this is out of the aforementioned range listed, please consider follow up with your Primary Care Provider.  If you are age 93 or younger, your body mass index should be between 19-25. Your Body mass index is 30.68 kg/m. If this is out of the aformentioned range listed, please consider follow up with your Primary Care Provider.   Your provider has requested that you go to the basement level for lab work before leaving today. Press "B" on the elevator. The lab is located at the first door on the left as you exit the elevator.  You have been scheduled for a colonoscopy. Please follow written instructions given to you at your visit today.  Please pick up your prep supplies at the pharmacy within the next 1-3 days. If you use inhalers (even only as needed), please bring them with you on the day of your procedure.  Due to recent changes in healthcare laws, you may see the results of your imaging and laboratory studies on MyChart before your provider has had a chance to review them.  We understand that in some cases there may be results that are confusing or concerning to you. Not all laboratory results come back in the same time frame and the provider may be waiting for multiple results in order to interpret others.  Please give [place] 48 hours in order for your provider to thoroughly review all the results before contacting the office for clarification of your results.   You will be contacted by our office prior to your procedure for directions on holding your Plavix.  If you do not hear from our office 1 week prior to your scheduled procedure, please call [phone] to discuss.   Follow up pending at this time.  Thank you for entrusting me with your care and choosing [facility].  Amy Esterwood, PA-C

## 2020-05-18 NOTE — Telephone Encounter (Signed)
Dr [name] we need clearance to hold Plavix 5 days pre op- and remain on aspirin- for colonoscopy in this patient (somewhat complicated cardiac history).  Kerin Ransom PA-C 05/18/2020 11:03 AM

## 2020-05-19 ENCOUNTER — Other Ambulatory Visit: Payer: Self-pay

## 2020-05-19 ENCOUNTER — Ambulatory Visit (INDEPENDENT_AMBULATORY_CARE_PROVIDER_SITE_OTHER): Payer: Managed Care, Other (non HMO) | Admitting: Legal Medicine

## 2020-05-19 ENCOUNTER — Encounter: Payer: Self-pay | Admitting: Legal Medicine

## 2020-05-19 VITALS — BP 130/80 | HR 68 | Temp 98.1°F | Resp 16 | Ht 71.0 in | Wt 221.0 lb

## 2020-05-19 DIAGNOSIS — M25561 Pain in right knee: Secondary | ICD-10-CM | POA: Diagnosis not present

## 2020-05-19 DIAGNOSIS — T466X5A Adverse effect of antihyperlipidemic and antiarteriosclerotic drugs, initial encounter: Secondary | ICD-10-CM | POA: Diagnosis not present

## 2020-05-19 DIAGNOSIS — M791 Myalgia, unspecified site: Secondary | ICD-10-CM | POA: Diagnosis not present

## 2020-05-19 DIAGNOSIS — M25552 Pain in left hip: Secondary | ICD-10-CM

## 2020-05-19 DIAGNOSIS — M25562 Pain in left knee: Secondary | ICD-10-CM

## 2020-05-19 DIAGNOSIS — D582 Other hemoglobinopathies: Secondary | ICD-10-CM

## 2020-05-19 DIAGNOSIS — G8929 Other chronic pain: Secondary | ICD-10-CM | POA: Insufficient documentation

## 2020-05-19 DIAGNOSIS — M25551 Pain in right hip: Secondary | ICD-10-CM

## 2020-05-19 MED ORDER — TRIAMCINOLONE ACETONIDE 40 MG/ML IJ SUSP
80.0000 mg | Freq: Once | INTRAMUSCULAR | Status: AC
Start: 2020-05-19 — End: 2020-05-19
  Administered 2020-05-19: 80 mg via INTRAMUSCULAR

## 2020-05-19 NOTE — Progress Notes (Signed)
Subjective:  Patient ID: Jacob Collier, male    DOB: 1955/06/19  Age: 65 y.o. MRN: [id number]  Chief Complaint  Patient presents with  . Muscle Pain    HPI: patient having myalgias.  He is on crestor ? If this is causing it. He is having hip and knee pain and in both shoulders. Not on Co Q 10.  Dr. [name] wants him on statin. We discussed the need for cholesterol control with his unstable angina.  He will take Co Q-10   Current Outpatient Medications on File Prior to Visit  Medication Sig Dispense Refill  . amLODipine (NORVASC) 2.5 MG tablet TAKE 1 TABLET BY MOUTH EVERY DAY 90 tablet 3  . aspirin 81 MG EC tablet Take 81 mg by mouth daily.     . clonazePAM (KLONOPIN) 0.5 MG tablet TAKE 1 TABLET (0.5 MG TOTAL) BY MOUTH 3 (THREE) TIMES DAILY AS NEEDED FOR ANXIETY. 90 tablet 3  . clopidogrel (PLAVIX) 75 MG tablet Take 1 tablet (75 mg total) by mouth daily. 90 tablet 3  . Eszopiclone 3 MG TABS Take 1 tablet (3 mg total) by mouth at bedtime. 30 tablet 3  . ezetimibe (ZETIA) 10 MG tablet TAKE 1 TABLET BY MOUTH EVERY DAY 90 tablet 3  . isosorbide mononitrate (IMDUR) 30 MG 24 hr tablet Take 15 mg by mouth in the morning and at bedtime.     . metoprolol succinate (TOPROL XL) 25 MG 24 hr tablet Take 1 tablet (25 mg total) by mouth daily. 90 tablet 3  . Omega-3 Fatty Acids (FISH OIL) 1000 MG CAPS Take 1,000 mg by mouth daily.     . rosuvastatin (CRESTOR) 10 MG tablet Take 1 tablet (10 mg total) by mouth every other day. 15 tablet 6  . Sodium Sulfate-Mag Sulfate-KCl (SUTAB) [phone] MG TABS Take 1 kit by mouth as directed. 24 tablet 0  . losartan (COZAAR) 25 MG tablet Take 0.5 tablets (12.5 mg total) by mouth daily. 45 tablet 3  . nitroGLYCERIN (NITROSTAT) 0.4 MG SL tablet Place 1 tablet (0.4 mg total) under the tongue every 5 (five) minutes as needed for chest pain. 25 tablet 2   No current facility-administered medications on file prior to visit.   Past Medical History:  Diagnosis Date   . Arthritis   . Coronary artery disease    2 stents  . Diverticulosis   . Hard of hearing   . Hypertension    hx of elevation on lyrica, off med and now normal   . Wears glasses    Past Surgical History:  Procedure Laterality Date  . CARDIAC CATHETERIZATION     with 2 stents   . COLECTOMY  05/2018  . EYE SURGERY  02/2020   bilaterl cataracts  . PROSTATECTOMY    . TOTAL HIP ARTHROPLASTY Left 11/10/2015   Procedure: LEFT TOTAL HIP ARTHROPLASTY ANTERIOR APPROACH;  Surgeon: Mcarthur Rossetti, MD;  Location: Center;  Service: Orthopedics;  Laterality: Left;    Family History  Problem Relation Age of Onset  . Alzheimer's disease Mother   . Stroke Father   . Diabetes Sister   . Heart attack Maternal Grandmother   . Cancer Maternal Uncle        prostate  . Prostate cancer Maternal Uncle   . Cancer Maternal Uncle        prostate  . Prostate cancer Maternal Uncle   . Colon polyps Brother   . Colon cancer Neg Hx   .  Esophageal cancer Neg Hx   . Rectal cancer Neg Hx   . Stomach cancer Neg Hx    Social History   Socioeconomic History  . Marital status: Married    Spouse name: Not on file  . Number of children: 0  . Years of education: Not on file  . Highest education level: Not on file  Occupational History  . Occupation: Employed at [employer]  Tobacco Use  . Smoking status: Never Smoker  . Smokeless tobacco: Never Used  Vaping Use  . Vaping Use: Former  Substance and Sexual Activity  . Alcohol use: Not Currently    Alcohol/week: 0.0 standard drinks    Comment: rarely  . Drug use: No  . Sexual activity: Yes    Partners: Female    Birth control/protection: None  Other Topics Concern  . Not on file  Social History Narrative  . Not on file   Social Determinants of Health   Financial Resource Strain: Not on file  Food Insecurity: Not on file  Transportation Needs: Not on file  Physical Activity: Not on file  Stress: Not on file  Social Connections: Not  on file    Review of Systems  Constitutional: Negative for activity change and appetite change.  HENT: Negative for congestion and sinus pain.   Eyes: Negative for visual disturbance.  Respiratory: Negative for chest tightness and shortness of breath.   Cardiovascular: Negative for chest pain, palpitations and leg swelling.  Gastrointestinal: Negative for abdominal distention and abdominal pain.  Endocrine: Negative for polyuria.  Genitourinary: Negative.   Musculoskeletal: Positive for arthralgias.  Neurological: Negative.   Psychiatric/Behavioral: Negative.      Objective:  BP 130/80   Pulse 68   Temp 98.1 F (36.7 C)   Resp 16   Ht 5' 11"  (1.803 m)   Wt 221 lb (100.2 kg)   SpO2 96%   BMI 30.82 kg/m   BP/Weight 05/19/2020 05/18/2020 1/44/3154  Systolic BP 008 676 195  Diastolic BP 80 77 72  Wt. (Lbs) 221 220 226  BMI 30.82 30.68 31.52    Physical Exam Vitals reviewed.  Constitutional:      Appearance: Normal appearance.  HENT:     Right Ear: Tympanic membrane, ear canal and external ear normal.     Left Ear: Tympanic membrane, ear canal and external ear normal.  Eyes:     Extraocular Movements: Extraocular movements intact.     Conjunctiva/sclera: Conjunctivae normal.     Pupils: Pupils are equal, round, and reactive to light.  Cardiovascular:     Rate and Rhythm: Normal rate and regular rhythm.     Pulses: Normal pulses.     Heart sounds: Normal heart sounds. No murmur heard. No gallop.   Pulmonary:     Effort: Pulmonary effort is normal. No respiratory distress.     Breath sounds: Normal breath sounds. No rales.  Abdominal:     General: Abdomen is flat. Bowel sounds are normal. There is no distension.     Palpations: Abdomen is soft.     Tenderness: There is no abdominal tenderness.  Musculoskeletal:        General: Tenderness present.     Cervical back: Normal range of motion and neck supple.  Skin:    General: Skin is warm.     Capillary Refill:  Capillary refill takes less than 2 seconds.  Neurological:     General: No focal deficit present.     Mental Status: He is  alert and oriented to person, place, and time.  Psychiatric:        Mood and Affect: Mood normal.       Lab Results  Component Value Date   WBC 10.0 05/18/2020   HGB 18.4 Repeated and verified X2. (HH) 05/18/2020   HCT 54.5 (H) 05/18/2020   PLT 168.0 05/18/2020   GLUCOSE 80 04/01/2020   CHOL 108 09/13/2019   TRIG 119 09/13/2019   HDL 28 (L) 09/13/2019   LDLCALC 58 09/13/2019   ALT 50 (H) 04/01/2020   AST 34 04/01/2020   NA 140 04/01/2020   K 5.1 04/01/2020   CL 104 04/01/2020   CREATININE 1.19 04/01/2020   BUN 12 04/01/2020   CO2 22 04/01/2020   INR 1.03 06/24/2009   HGBA1C 5.8 (H) 11/28/2019      Assessment & Plan:   Diagnoses and all orders for this visit: Myalgia due to statin -     CK Patient needs to be on a statin for hs heart.  We will check his muscle enzymes.  I feel this is more arthralgias from his arthritis and cold weather.  Chronic arthralgias of knees and hips -     triamcinolone acetonide (KENALOG-40) injection 80 mg For arlthalgias , and for seasonal allergies, we gave him a kenalog injection       I spent 20 minutes dedicated to the care of this patient on the date of this encounter to include face-to-face time with the patient, as well as:   Follow-up: Return in about 1 month (around 06/19/2020).  An After Visit Summary was printed and given to the patient.  Reinaldo Meeker, MD [facility] [phone]

## 2020-05-22 NOTE — Telephone Encounter (Signed)
Okay to hold Plavix for 5 days prior to planned colonoscopy

## 2020-05-22 NOTE — Telephone Encounter (Signed)
   Primary Cardiologist: [name], MD  Chart reviewed as part of pre-operative protocol coverage.  Per Dr. [name], patient can hold plavix 5 days prior to his upcoming colonoscopy with plans to restart plavix as soon as he is cleared to do so by his gastroenterologist.  I will route this recommendation to the requesting party via [place] fax function and remove from pre-op pool.  Please call with questions.  Abigail Butts, PA-C 05/22/2020, 3:44 PM

## 2020-05-26 ENCOUNTER — Encounter: Payer: Self-pay | Admitting: Legal Medicine

## 2020-05-26 ENCOUNTER — Other Ambulatory Visit: Payer: Self-pay

## 2020-05-26 ENCOUNTER — Ambulatory Visit (INDEPENDENT_AMBULATORY_CARE_PROVIDER_SITE_OTHER): Payer: Managed Care, Other (non HMO) | Admitting: Legal Medicine

## 2020-05-26 VITALS — BP 120/64 | HR 67 | Temp 97.6°F | Resp 16 | Ht 71.0 in | Wt 230.0 lb

## 2020-05-26 DIAGNOSIS — M545 Low back pain, unspecified: Secondary | ICD-10-CM

## 2020-05-26 DIAGNOSIS — M5432 Sciatica, left side: Secondary | ICD-10-CM

## 2020-05-26 DIAGNOSIS — C61 Malignant neoplasm of prostate: Secondary | ICD-10-CM | POA: Diagnosis not present

## 2020-05-26 MED ORDER — TRIAMCINOLONE ACETONIDE 40 MG/ML IJ SUSP
80.0000 mg | Freq: Once | INTRAMUSCULAR | Status: AC
  Administered 2020-05-26: 80 mg via INTRAMUSCULAR

## 2020-05-26 MED ORDER — HYDROCODONE-ACETAMINOPHEN 10-325 MG PO TABS: 1.0000 | ORAL_TABLET | Freq: Three times a day (TID) | ORAL | 0 refills | Status: AC | PRN

## 2020-05-26 NOTE — Progress Notes (Signed)
Subjective:  Patient ID: Jacob Collier, male    DOB: November 01, 1955  Age: 65 y.o. MRN: [id number]  Chief Complaint  Patient presents with  . Hip Pain    Left; requesting injection. States hip has been throbbing for about a week.    HPI: he continues to have pain in left gluteal area.  He has THA on left 2 years ago.  The injection last week did not help.  He has not had back workup.  He is on crestor and having myalgias. It is diffuse.     Current Outpatient Medications on File Prior to Visit  Medication Sig Dispense Refill  . amLODipine (NORVASC) 2.5 MG tablet TAKE 1 TABLET BY MOUTH EVERY DAY 90 tablet 3  . aspirin 81 MG EC tablet Take 81 mg by mouth daily.     . clonazePAM (KLONOPIN) 0.5 MG tablet TAKE 1 TABLET (0.5 MG TOTAL) BY MOUTH 3 (THREE) TIMES DAILY AS NEEDED FOR ANXIETY. 90 tablet 3  . clopidogrel (PLAVIX) 75 MG tablet Take 1 tablet (75 mg total) by mouth daily. 90 tablet 3  . Eszopiclone 3 MG TABS Take 1 tablet (3 mg total) by mouth at bedtime. 30 tablet 3  . ezetimibe (ZETIA) 10 MG tablet TAKE 1 TABLET BY MOUTH EVERY DAY 90 tablet 3  . isosorbide mononitrate (IMDUR) 30 MG 24 hr tablet Take 15 mg by mouth in the morning and at bedtime.     . metoprolol succinate (TOPROL XL) 25 MG 24 hr tablet Take 1 tablet (25 mg total) by mouth daily. 90 tablet 3  . Omega-3 Fatty Acids (FISH OIL) 1000 MG CAPS Take 1,000 mg by mouth daily.     . rosuvastatin (CRESTOR) 10 MG tablet Take 1 tablet (10 mg total) by mouth every other day. 15 tablet 6  . Sodium Sulfate-Mag Sulfate-KCl (SUTAB) [phone] MG TABS Take 1 kit by mouth as directed. 24 tablet 0  . losartan (COZAAR) 25 MG tablet Take 0.5 tablets (12.5 mg total) by mouth daily. 45 tablet 3  . nitroGLYCERIN (NITROSTAT) 0.4 MG SL tablet Place 1 tablet (0.4 mg total) under the tongue every 5 (five) minutes as needed for chest pain. 25 tablet 2   No current facility-administered medications on file prior to visit.   Past Medical History:   Diagnosis Date  . Arthritis   . Coronary artery disease    2 stents  . Diverticulosis   . Hard of hearing   . Hypertension    hx of elevation on lyrica, off med and now normal   . Wears glasses    Past Surgical History:  Procedure Laterality Date  . CARDIAC CATHETERIZATION     with 2 stents   . COLECTOMY  05/2018  . EYE SURGERY  02/2020   bilaterl cataracts  . PROSTATECTOMY    . TOTAL HIP ARTHROPLASTY Left 11/10/2015   Procedure: LEFT TOTAL HIP ARTHROPLASTY ANTERIOR APPROACH;  Surgeon: Mcarthur Rossetti, MD;  Location: [place];  Service: Orthopedics;  Laterality: Left;    Family History  Problem Relation Age of Onset  . Alzheimer's disease Mother   . Stroke Father   . Diabetes Sister   . Heart attack Maternal Grandmother   . Cancer Maternal Uncle        prostate  . Prostate cancer Maternal Uncle   . Cancer Maternal Uncle        prostate  . Prostate cancer Maternal Uncle   . Colon polyps Brother   .  Colon cancer Neg Hx   . Esophageal cancer Neg Hx   . Rectal cancer Neg Hx   . Stomach cancer Neg Hx    Social History   Socioeconomic History  . Marital status: Married    Spouse name: Not on file  . Number of children: 0  . Years of education: Not on file  . Highest education level: Not on file  Occupational History  . Occupation: Employed at [employer]  Tobacco Use  . Smoking status: Never Smoker  . Smokeless tobacco: Never Used  Vaping Use  . Vaping Use: Former  Substance and Sexual Activity  . Alcohol use: Not Currently    Alcohol/week: 0.0 standard drinks    Comment: rarely  . Drug use: No  . Sexual activity: Yes    Partners: Female    Birth control/protection: None  Other Topics Concern  . Not on file  Social History Narrative  . Not on file   Social Determinants of Health   Financial Resource Strain: Not on file  Food Insecurity: Not on file  Transportation Needs: Not on file  Physical Activity: Not on file  Stress: Not on file  Social  Connections: Not on file    Review of Systems  Constitutional: Negative for activity change and appetite change.  HENT: Negative.   Eyes: Negative for visual disturbance.  Respiratory: Negative for chest tightness and shortness of breath.   Cardiovascular: Negative for chest pain, palpitations and leg swelling.  Gastrointestinal: Negative for abdominal distention and abdominal pain.  Endocrine: Negative for polyuria.  Genitourinary: Negative for difficulty urinating, dysuria and urgency.  Musculoskeletal: Positive for arthralgias and back pain.  Skin: Negative.   Neurological: Negative.   Psychiatric/Behavioral: Negative.      Objective:  BP 120/64 (BP Location: Left Arm, Patient Position: Sitting, Cuff Size: Normal)   Pulse 67   Temp 97.6 F (36.4 C) (Temporal)   Resp 16   Ht 5' 11"  (1.803 m)   Wt 230 lb (104.3 kg)   SpO2 96%   BMI 32.08 kg/m   BP/Weight 05/26/2020 05/19/2020 3/33/8329  Systolic BP 191 660 600  Diastolic BP 64 80 77  Wt. (Lbs) 230 221 220  BMI 32.08 30.82 30.68    Physical Exam Vitals reviewed.  HENT:     Head: Normocephalic and atraumatic.     Right Ear: Tympanic membrane, ear canal and external ear normal.     Left Ear: Ear canal and external ear normal.     Mouth/Throat:     Mouth: Mucous membranes are moist.     Pharynx: Oropharynx is clear.  Eyes:     Extraocular Movements: Extraocular movements intact.     Conjunctiva/sclera: Conjunctivae normal.     Pupils: Pupils are equal, round, and reactive to light.  Cardiovascular:     Rate and Rhythm: Normal rate and regular rhythm.     Pulses: Normal pulses.     Heart sounds: Normal heart sounds. No murmur heard. No gallop.   Pulmonary:     Effort: Pulmonary effort is normal. No respiratory distress.     Breath sounds: Normal breath sounds. No rales.  Abdominal:     General: Abdomen is flat. Bowel sounds are normal. There is no distension.     Palpations: Abdomen is soft.     Tenderness:  There is no abdominal tenderness.  Musculoskeletal:        General: Tenderness present.     Cervical back: Normal range of motion.  Legs:     Comments: Pain in left gluteal area  Left hip full ROM  Skin:    General: Skin is warm and dry.     Capillary Refill: Capillary refill takes less than 2 seconds.  Neurological:     General: No focal deficit present.     Mental Status: He is alert and oriented to person, place, and time. Mental status is at baseline.       Lab Results  Component Value Date   WBC 10.0 05/18/2020   HGB 18.4 Repeated and verified X2. (HH) 05/18/2020   HCT 54.5 (H) 05/18/2020   PLT 168.0 05/18/2020   GLUCOSE 80 04/01/2020   CHOL 108 09/13/2019   TRIG 119 09/13/2019   HDL 28 (L) 09/13/2019   LDLCALC 58 09/13/2019   ALT 50 (H) 04/01/2020   AST 34 04/01/2020   NA 140 04/01/2020   K 5.1 04/01/2020   CL 104 04/01/2020   CREATININE 1.19 04/01/2020   BUN 12 04/01/2020   CO2 22 04/01/2020   INR 1.03 06/24/2009   HGBA1C 5.8 (H) 11/28/2019      Assessment & Plan:   Diagnoses and all orders for this visit: Lumbar pain -     DG Lumbar Spine Complete -     triamcinolone acetonide (KENALOG-40) injection 80 mg -     HYDROcodone-acetaminophen (NORCO) 10-325 MG tablet; Take 1 tablet by mouth every 8 (eight) hours as needed for up to 5 days. Patient has left gluteal pain, may be gluteal bursitis vs lumbar disc disease  Prostate cancer [facility]) Patient has finished all surgery and there is not evidence for recurrence  Sciatica without back pain, left Injection left gluteal bursa        I spent 15 minutes dedicated to the care of this patient on the date of this encounter to include face-to-face time with the patient, as well [id number] old records  Follow-up: Return in about 3 months (around 08/26/2020) for fasting.  An After Visit Summary was printed and given to the patient.  Reinaldo Meeker, MD [facility] [phone]

## 2020-05-28 NOTE — Progress Notes (Signed)
Addendum: Reviewed and agree with assessment and management plan. Pyrtle, Jay M, MD  

## 2020-06-02 ENCOUNTER — Telehealth: Payer: Self-pay | Admitting: Cardiovascular Disease

## 2020-06-02 NOTE — Telephone Encounter (Signed)
  Pt c/o medication issue:  1. Name of Medication:   rosuvastatin (CRESTOR) 10 MG tablet  2. How are you currently taking this medication (dosage and times per day)? As prescribed  3. Are you having a reaction (difficulty breathing--STAT)?  Please see question 4  4. What is your medication issue?   Patient states he is having the same symptoms he had when he had the blockage before. He is seeing black spots, BP is going up, and SOB on exertion and he was unable to work today. He would like to speak to nurse because he thinks it could be coming from the medication.

## 2020-06-02 NOTE — Telephone Encounter (Signed)
Spoke with patient who stated he had lots of body/join aches Now taking Crestor every other day Has had shortness of breath with exertion, recently started  Denies chest pain Scheduled appointment with Dr [name] for 06/12/2020 and to call back if worse

## 2020-06-03 NOTE — Telephone Encounter (Signed)
ok 

## 2020-06-05 NOTE — Telephone Encounter (Signed)
Left voicemail for patient to advise that we have gotten an okay to hold his Plavix starting on 4/27. Advised for patient to call back to let me know he got this message.

## 2020-06-11 ENCOUNTER — Encounter: Payer: Self-pay | Admitting: Legal Medicine

## 2020-06-11 NOTE — Telephone Encounter (Signed)
Left voicemail for patient to call and confirm that he did get my previous voicemail about when to hold Plavix.

## 2020-06-12 ENCOUNTER — Encounter: Payer: Self-pay | Admitting: Cardiovascular Disease

## 2020-06-12 ENCOUNTER — Other Ambulatory Visit: Payer: Self-pay

## 2020-06-12 ENCOUNTER — Ambulatory Visit (INDEPENDENT_AMBULATORY_CARE_PROVIDER_SITE_OTHER): Payer: Managed Care, Other (non HMO) | Admitting: Cardiovascular Disease

## 2020-06-12 DIAGNOSIS — I10 Essential (primary) hypertension: Secondary | ICD-10-CM | POA: Diagnosis not present

## 2020-06-12 DIAGNOSIS — F419 Anxiety disorder, unspecified: Secondary | ICD-10-CM

## 2020-06-12 DIAGNOSIS — I25119 Atherosclerotic heart disease of native coronary artery with unspecified angina pectoris: Secondary | ICD-10-CM

## 2020-06-12 DIAGNOSIS — R0600 Dyspnea, unspecified: Secondary | ICD-10-CM | POA: Diagnosis not present

## 2020-06-12 DIAGNOSIS — I471 Supraventricular tachycardia, unspecified: Secondary | ICD-10-CM

## 2020-06-12 DIAGNOSIS — E785 Hyperlipidemia, unspecified: Secondary | ICD-10-CM

## 2020-06-12 DIAGNOSIS — R0609 Other forms of dyspnea: Secondary | ICD-10-CM

## 2020-06-12 MED ORDER — ISOSORBIDE MONONITRATE ER 30 MG PO TB24: ORAL_TABLET | ORAL | 1 refills | Status: DC

## 2020-06-12 MED ORDER — METOPROLOL SUCCINATE ER 25 MG PO TB24: 37.5000 mg | ORAL_TABLET | Freq: Every day | ORAL | 3 refills | Status: DC

## 2020-06-12 MED ORDER — LIVALO 1 MG PO TABS: 1.0000 mg | ORAL_TABLET | Freq: Every day | ORAL | 2 refills | Status: DC

## 2020-06-12 NOTE — Patient Instructions (Signed)
Medication Instructions:  Increase Isosorbide 30 mg in the morning, and 15 mg in the evening. Increase Metoprolol to 1.5 tablets (37.5 mg) in the morning. Start Livalo 1 mg daily   *If you need a refill on your cardiac medications before your next appointment, please call your pharmacy*   Testing/Procedures: Echocardiogram - Your physician has requested that you have an echocardiogram. Echocardiography is a painless test that uses sound waves to create images of your heart. It provides your doctor with information about the size and shape of your heart and how well your heart's chambers and valves are working. This procedure takes approximately one hour. There are no restrictions for this procedure. This will be performed at our [facility] location - [address].    Follow-Up: At [facility], you and your health needs are our priority.  As part of our continuing mission to provide you with exceptional heart care, we have created designated Provider Care Teams.  These Care Teams include your primary Cardiologist (physician) and Advanced Practice Providers (APPs -  Physician Assistants and Nurse Practitioners) who all work together to provide you with the care you need, when you need it.  We recommend signing up for the patient portal called "MyChart".  Sign up information is provided on this After Visit Summary.  MyChart is used to connect with patients for Virtual Visits (Telemedicine).  Patients are able to view lab/test results, encounter notes, upcoming appointments, etc.  Non-urgent messages can be sent to your provider as well.   To learn more about what you can do with MyChart, go to [email or web address].    Your next appointment:   2 month(s)  The format for your next appointment:   In Person  Provider:   [name], MD

## 2020-06-12 NOTE — Progress Notes (Signed)
Cardiology Office Note    Date:  06/14/2020   ID:  Jacob, Collier April 10, 1955, MRN [id number]  PCP:  [name], MD  Cardiologist:  [name], MD   No chief complaint on file.  3 month F/U   History of Present Illness:  Jacob Collier is a 65 y.o. male who is a former patient of Dr. Debara Pickett.  He last saw Dr. Debara Pickett in 2018.  He has known CAD since 2018 has been followed by cardiology in [facility].  He is status post PCI to the proximal to mid RCA at catheterization on January 04, 2019 and had known CTO of his circumflex.  Suddenly he had undergone repeat catheterization in March 2021 showing in-stent restenosis of his RCA stent which progressed to chronic total occlusion as well as chronic total occlusion of his circumflex.  His LAD was patent.  He had been seen by his cardiologist when he presented to [facility] on June 07, 2019 requesting a second opinion.  At that time I saw him in the hospital and ultimately was able to obtain the angiographic studies that were done at [facility] for his most recent catheterization of May 25, 2019.  When that time I reviewed the angiographic films with Dr. Martinique who did not feel his CTO of his RCA was amenable to intervention due to lack of any beak large occlusion at the site of a small branch.  Complex was not amenable to intervention.  He did have some concomitant CAD.  After much discussion I reviewed the data with the patient and felt that medical therapy was the best option.  There was some concern in the past and he did not benefit from initial dosing of ranolazine.  He was discharged the following day and increased medical regimen consisting of isosorbide 30 mg, amlodipine, aspirin and Plavix in addition to his previous lisinopril.  He was bradycardic in the 50s.  He also was treated with high potency statin therapy and atorvastatin was increased to 80 mg.  He presented to the emergency room on June 11, 2019 with  palpitations and neck tightness.  EMS reported a narrow complex tachycardia at a rate of 160 bpm.  Upon arrival to the emergency room he was in sinus rhythm with PVCs and bigeminy.  He was suspected to have had very brief episode of SVT and the plan was to start Toprol-XL 25 mg daily.  I saw him after his ER evaluation in June 25, 2019.  He had  decided to continue his cardiology care with me since his hospitalization.  He has felt improved and denies any significant increasing chest pain symptoms or recurrent episodes of SVT.[name]  He has noticed development of myalgias since his increased atorvastatin dose.  He also has noticed a mild cough on lisinopril.  During that evaluation, in light of his cough sensitization I recommended switching to losartan who started him on 25 mg daily.  Due to issues with atorvastatin causing myalgias I recommended he reduce his dose to 20 mg and also recommended initiation of Zetia 10 mg.  We discussed if he continued to have increasing anginal symptomatology.  Repeat challenge with Ranexa could be undertaken.  He was subsequently evaluated by Sande Rives, PA-C in June 2021.  He had a cardiac monitor which showed predominant sinus rhythm at 65 bpm.  The slowest heart rate was sinus bradycardia at 42 bpm the fastest heart rate was sinus tachycardia 164 bpm.  He had occasional PVCs with a ventricular couplet and several episodes of short-lived ventricular bigeminal rhythm.  There was a 4 beat episode of nonsustained VT, 92 bpm.  He also had another episode of SVT lasting 36 seconds.  I saw him in September 2021.  Since he has been on a beta-blocker therapy, he denied any recurrent tachycardia dysrhythmias.  He was on a regimen consisting of amlodipine 2.5 mg, isosorbide 30 mg but he is taking this 50 mg twice a day, losartan 25 mg, Toprol-XL 25 mg daily.  He continues to be on DAPT with aspirin/Plavix.  He is tolerating atorvastatin 20 mg and Zetia 10 mg for his hyperlipidemia.   He has noticed some episodes of mild chest discomfort.  He denied recurrent SVT and is asked he feels this was anxiety mediated and he had been placed on low-dose Klonopin by his primary provider.   I last saw him on April 01, 2020.  His palpitations are less since he has been taking Klonopin.  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  He has had urinary issues and apparently has dark urine.  He is being evaluated by Dr. Vikki Ports of urology and may require bladder sphincter surgery.  He has undergone urinalysis.  He was told perhaps that some of his dark urine may read present a metabolic issue.  He has a history of prostatectomy and radiation treatment.  He admits to significant myalgias and has been on atorvastatin 20 mg and Zetia 10 mg for hyperlipidemia.  During that evaluation, I recommended he discontinue atorvastatin and try rosuvastatin 10 mg every other day along with Zetia and attempt to achieve a target LDL goal less than 70.  He was on aspirin/Plavix for DAPT and amlodipine 5 mg, isosorbide 15 mg twice a day, metoprolol succinate 25 mg and losartan 12.5 mg for hypertension/CAD.  He denied any recent anginal symptoms.    Since I last saw him, he discontinued rosuvastatin.  He works all day standing on hard concrete and he felt that both atorvastatin and rosuvastatin were contributing to some myalgias.  He denies any anginal symptoms.  At times he does note some mild shortness of breath with activity.  He is unaware of palpitations.  He presents for reevaluation.   Past Medical History:  Diagnosis Date  . Arthritis   . Coronary artery disease    2 stents  . Diverticulosis   . Hard of hearing   . Hypertension    hx of elevation on lyrica, off med and now normal   . Wears glasses     Past Surgical History:  Procedure Laterality Date  . CARDIAC CATHETERIZATION     with 2 stents   . COLECTOMY  05/2018  . EYE SURGERY  02/2020   bilaterl cataracts  . PROSTATECTOMY    .  TOTAL HIP ARTHROPLASTY Left 11/10/2015   Procedure: LEFT TOTAL HIP ARTHROPLASTY ANTERIOR APPROACH;  Surgeon: Mcarthur Rossetti, MD;  Location: [facility];  Service: Orthopedics;  Laterality: Left;    Current Medications: Outpatient Medications Prior to Visit  Medication Sig Dispense Refill  . amLODipine (NORVASC) 2.5 MG tablet TAKE 1 TABLET BY MOUTH EVERY DAY 90 tablet 3  . aspirin 81 MG EC tablet Take 81 mg by mouth daily.     . clonazePAM (KLONOPIN) 0.5 MG tablet TAKE 1 TABLET (0.5 MG TOTAL) BY MOUTH 3 (THREE) TIMES DAILY AS NEEDED FOR ANXIETY. 90 tablet 3  . clopidogrel (PLAVIX) 75 MG tablet Take  1 tablet (75 mg total) by mouth daily. 90 tablet 3  . Eszopiclone 3 MG TABS Take 1 tablet (3 mg total) by mouth at bedtime. 30 tablet 3  . ezetimibe (ZETIA) 10 MG tablet TAKE 1 TABLET BY MOUTH EVERY DAY 90 tablet 3  . Omega-3 Fatty Acids (FISH OIL) 1000 MG CAPS Take 1,000 mg by mouth daily.     . Sodium Sulfate-Mag Sulfate-KCl (SUTAB) [phone] MG TABS Take 1 kit by mouth as directed. 24 tablet 0  . isosorbide mononitrate (IMDUR) 30 MG 24 hr tablet Take 15 mg by mouth in the morning and at bedtime.     . metoprolol succinate (TOPROL XL) 25 MG 24 hr tablet Take 1 tablet (25 mg total) by mouth daily. 90 tablet 3  . rosuvastatin (CRESTOR) 10 MG tablet Take 1 tablet (10 mg total) by mouth every other day. 15 tablet 6  . losartan (COZAAR) 25 MG tablet Take 0.5 tablets (12.5 mg total) by mouth daily. 45 tablet 3  . nitroGLYCERIN (NITROSTAT) 0.4 MG SL tablet Place 1 tablet (0.4 mg total) under the tongue every 5 (five) minutes as needed for chest pain. 25 tablet 2   No facility-administered medications prior to visit.     Allergies:   Ranolazine   Social History   Socioeconomic History  . Marital status: Married    Spouse name: Not on file  . Number of children: 0  . Years of education: Not on file  . Highest education level: Not on file  Occupational History  . Occupation: Employed at Chemical engineer  Tobacco Use  . Smoking status: Never Smoker  . Smokeless tobacco: Never Used  Vaping Use  . Vaping Use: Former  Substance and Sexual Activity  . Alcohol use: Not Currently    Alcohol/week: 0.0 standard drinks    Comment: rarely  . Drug use: No  . Sexual activity: Yes    Partners: Female    Birth control/protection: None  Other Topics Concern  . Not on file  Social History Narrative  . Not on file   Social Determinants of Health   Financial Resource Strain: Not on file  Food Insecurity: Not on file  Transportation Needs: Not on file  Physical Activity: Not on file  Stress: Not on file  Social Connections: Not on file     Family History:  The patient's family history includes Alzheimer's disease in his mother; Cancer in his maternal uncle and maternal uncle; Colon polyps in his brother; Diabetes in his sister; Heart attack in his maternal grandmother; Prostate cancer in his maternal uncle and maternal uncle; Stroke in his father.   ROS General: Negative; No fevers, chills, or night sweats;  HEENT: Negative; No changes in vision or hearing, sinus congestion, difficulty swallowing Pulmonary: Negative; No cough, wheezing, shortness of breath, hemoptysis Cardiovascular: Negative; No chest pain, presyncope, syncope, palpitations GI: Negative; No nausea, vomiting, diarrhea, or abdominal pain GU: h/o prostatectomy, radiation treatment.  Need for bladder sphincter surgery.  Complains of dark urine. Musculoskeletal: Myalgias Hematologic/Oncology: Negative; no easy bruising, bleeding Endocrine: Negative; no heat/cold intolerance; no diabetes Neuro: Negative; no changes in balance, headaches Skin: Negative; No rashes or skin lesions Psychiatric: Negative; No behavioral problems, depression Sleep: Negative; No snoring, daytime sleepiness, hypersomnolence, bruxism, restless legs, hypnogognic hallucinations, no cataplexy Other comprehensive 14 point system review is  negative.   PHYSICAL EXAM:   VS:  BP 126/68 (BP Location: Left Arm, Patient Position: Sitting, Cuff Size: Normal)   Pulse 62  Ht 5' 11"  (1.803 m)   Wt 226 lb (102.5 kg)   BMI 31.52 kg/m     Repeat blood pressure by me was 128/74  Wt Readings from Last 3 Encounters:  06/12/20 226 lb (102.5 kg)  05/26/20 230 lb (104.3 kg)  05/19/20 221 lb (100.2 kg)    General: Alert, oriented, no distress.  Skin: normal turgor, no rashes, warm and dry HEENT: Normocephalic, atraumatic. Pupils equal round and reactive to light; sclera anicteric; extraocular muscles intact;  Nose without nasal septal hypertrophy Mouth/Parynx benign; Mallinpatti scale 3 Neck: No JVD, no carotid bruits; normal carotid upstroke Lungs: clear to ausculatation and percussion; no wheezing or rales Chest wall: without tenderness to palpitation Heart: PMI not displaced, RRR, s1 s2 normal, 1/6 systolic murmur, no diastolic murmur, no rubs, gallops, thrills, or heaves Abdomen: soft, nontender; no hepatosplenomehaly, BS+; abdominal aorta nontender and not dilated by palpation. Back: no CVA tenderness Pulses 2+ Musculoskeletal: full range of motion, normal strength, no joint deformities Extremities: no clubbing cyanosis or edema, Homan's sign negative  Neurologic: grossly nonfocal; Cranial nerves grossly wnl Psychologic: Normal mood and affect   Studies/Labs Reviewed:   EKG:  EKG is ordered today.  ECG (independently read by me): NSR at 62; NSST changes  January 2022 ECG (independently read by me): NSR at 62; LVH, small inferior Q waves  September 2021 ECG (independently read by me):Normal sinus rhythm at 61 bpm.  Normal intervals.  No ectopy.  April 2021 ECG (independently read by me): Sinus bradycardia 54 bpm.  Small Q-wave in lead III.  No ectopy.  Nonspecific ST changes.  Recent Labs: BMP Latest Ref Rng & Units 04/01/2020 09/13/2019 06/11/2019  Glucose 65 - 99 mg/dL 80 84 113(H)  BUN 8 - 27 mg/dL 12 11 15   Creatinine  0.76 - 1.27 mg/dL 1.19 1.11 1.08  BUN/Creat Ratio 10 - 24 10 10  -  Sodium 134 - 144 mmol/L 140 141 138  Potassium 3.5 - 5.2 mmol/L 5.1 5.1 4.0  Chloride 96 - 106 mmol/L 104 107(H) 106  CO2 20 - 29 mmol/L 22 20 20(L)  Calcium 8.6 - 10.2 mg/dL 9.4 9.4 9.2     Hepatic Function Latest Ref Rng & Units 04/01/2020 09/13/2019 06/11/2019  Total Protein 6.0 - 8.5 g/dL 7.1 7.2 7.3  Albumin 3.8 - 4.8 g/dL 4.6 4.4 3.9  AST 0 - 40 IU/L 34 27 25  ALT 0 - 44 IU/L 50(H) 35 34  Alk Phosphatase 44 - 121 IU/L 82 72 84  Total Bilirubin 0.0 - 1.2 mg/dL 0.5 0.5 1.3(H)  Bilirubin, Direct 0.0 - 0.2 mg/dL - - -    CBC Latest Ref Rng & Units 05/18/2020 04/01/2020 06/11/2019  WBC 4.0 - 10.5 K/uL 10.0 8.6 6.9  Hemoglobin 13.0 - 17.0 g/dL 18.4 Repeated and verified X2.([place]) 18.4(H) 15.5  Hematocrit 39.0 - 52.0 % 54.5(H) 54.9(H) 45.9  Platelets 150.0 - 400.0 K/uL 168.0 219 219   Lab Results  Component Value Date   MCV 87.4 05/18/2020   MCV 87 04/01/2020   MCV 91.4 06/11/2019   No results found for: TSH Lab Results  Component Value Date   HGBA1C 5.8 (H) 11/28/2019     BNP    Component Value Date/Time   BNP 67.2 06/05/2019 2231    ProBNP No results found for: PROBNP   Lipid Panel     Component Value Date/Time   CHOL 108 09/13/2019 1123   TRIG 119 09/13/2019 1123   HDL 28 (L) 09/13/2019  1123   CHOLHDL 3.9 09/13/2019 1123   CHOLHDL 4.8 06/06/2019 0612   VLDL 29 06/06/2019 0612   LDLCALC 58 09/13/2019 1123   LABVLDL 22 09/13/2019 1123     RADIOLOGY: No results found.   Additional studies/ records that were reviewed today include:  I reviewed the patient's most recent hospitalization, and while in the hospital was able to review his outside angiographic films.    ASSESSMENT:    1. Coronary artery disease involving native coronary artery of native heart with angina pectoris ([place])   2. Exertional dyspnea   3. Essential hypertension   4. Hyperlipidemia LDL goal <70   5. History of  paroxysmal SVT (supraventricular tachycardia) (HCC)   6. Anxiety     PLAN:  Chanze Teagle is a 65 year old gentleman who has known CAD and underwent initial PCI to his RCA in October 2020 at which time he also had known CTO of his circumflex.  He has a history of hypertension and hyperlipidemia and subsequently developed CTO of his RCA which upon angiographic review was not amenable for attempt at intervention.  He has been documented to have SVT which has improved with initiation of metoprolol succinate.  He believes his episodes have been anxiety mediated and since he had been placed on low-dose Klonopin he is not experiencing any tachydysrhythmia.  Presently, he is not having any anginal symptomatology but has experienced some increase in shortness of breath when very active at work.  In the past, there is some issue regarding Ranexa intolerance.  He experienced some myalgias with atorvastatin and on a trial of every other day rosuvastatin he also felt similar symptoms and discontinued therapy.  Most recently he is only been on Zetia.  At his last evaluation a erythrocyte sedimentation rate was normal.  Presently, I have recommended a trial of Livalo which oftentimes can be tolerated in patients were otherwise statin intolerance due to with different hepatic metabolism.  With his known CAD with CTO's of his circumflex and RCA I have recommended slight titration of his isosorbide such that he will take 30 mg in the morning and 50 mg at night and I will slightly increase his metoprolol succinate from 25 mg up to 37.5 mg daily.  I have recommended a follow-up echo Doppler study to reassess systolic and diastolic function.  I will see him in 2 months for reevaluation   Medication Adjustments/Labs and Tests Ordered: Current medicines are reviewed at length with the patient today.  Concerns regarding medicines are outlined above.  Medication changes, Labs and Tests ordered today are listed in the Patient  Instructions below. Patient Instructions  Medication Instructions:  Increase Isosorbide 30 mg in the morning, and 15 mg in the evening. Increase Metoprolol to 1.5 tablets (37.5 mg) in the morning. Start Livalo 1 mg daily   *If you need a refill on your cardiac medications before your next appointment, please call your pharmacy*   Testing/Procedures: Echocardiogram - Your physician has requested that you have an echocardiogram. Echocardiography is a painless test that uses sound waves to create images of your heart. It provides your doctor with information about the size and shape of your heart and how well your heart's chambers and valves are working. This procedure takes approximately one hour. There are no restrictions for this procedure. This will be performed at our [facility] location - [address].    Follow-Up: At [facility], you and your health needs are our priority.  As  part of our continuing mission to provide you with exceptional heart care, we have created designated Provider Care Teams.  These Care Teams include your primary Cardiologist (physician) and Advanced Practice Providers (APPs -  Physician Assistants and Nurse Practitioners) who all work together to provide you with the care you need, when you need it.  We recommend signing up for the patient portal called "MyChart".  Sign up information is provided on this After Visit Summary.  MyChart is used to connect with patients for Virtual Visits (Telemedicine).  Patients are able to view lab/test results, encounter notes, upcoming appointments, etc.  Non-urgent messages can be sent to your provider as well.   To learn more about what you can do with MyChart, go to [email or web address].    Your next appointment:   2 month(s)  The format for your next appointment:   In Person  Provider:   [name], MD        Signed, [name], MD  06/14/2020 12:41 PM    [place] [address], [place], [place], [place]  [address] Phone: [phone]  8.I did okay

## 2020-06-14 ENCOUNTER — Encounter: Payer: Self-pay | Admitting: Cardiovascular Disease

## 2020-06-18 ENCOUNTER — Other Ambulatory Visit: Payer: Self-pay | Admitting: Cardiovascular Disease

## 2020-06-22 ENCOUNTER — Encounter: Payer: Self-pay | Admitting: Legal Medicine

## 2020-06-22 ENCOUNTER — Other Ambulatory Visit: Payer: Self-pay

## 2020-06-22 ENCOUNTER — Ambulatory Visit (INDEPENDENT_AMBULATORY_CARE_PROVIDER_SITE_OTHER): Payer: Managed Care, Other (non HMO) | Admitting: Legal Medicine

## 2020-06-22 VITALS — BP 144/80 | HR 63 | Temp 97.6°F | Resp 16 | Ht 71.0 in | Wt 227.0 lb

## 2020-06-22 DIAGNOSIS — C61 Malignant neoplasm of prostate: Secondary | ICD-10-CM

## 2020-06-22 DIAGNOSIS — M5432 Sciatica, left side: Secondary | ICD-10-CM

## 2020-06-22 DIAGNOSIS — D751 Secondary polycythemia: Secondary | ICD-10-CM | POA: Diagnosis not present

## 2020-06-22 DIAGNOSIS — E291 Testicular hypofunction: Secondary | ICD-10-CM | POA: Diagnosis not present

## 2020-06-22 MED ORDER — CELECOXIB 200 MG PO CAPS: 200.0000 mg | ORAL_CAPSULE | Freq: Two times a day (BID) | ORAL | 3 refills | Status: DC

## 2020-06-22 NOTE — Progress Notes (Signed)
Subjective:  Patient ID: Jacob Collier, male    DOB: 1955-12-04  Age: 65 y.o. MRN: [id number]  Chief Complaint  Patient presents with  . Hip Pain    HPI: taking testosterone illegally. Now hgb/hematocrit high. He stopped statins. He is taking injections every 10 days, he never told me about this and does not feel he is doing anything that will harm him.  He gets his information on [employer].  I discussed the harmful effects of testosterone with him especial with diagnosis of prostate cancer requiring prostatectomy.  He has chronic back pain, he had injection 3 weeks ago. I recommended getting orthopedics to check and treat.   He has polycythemia from testosterone despite having prostate cancer.   Current Outpatient Medications on File Prior to Visit  Medication Sig Dispense Refill  . amLODipine (NORVASC) 2.5 MG tablet TAKE 1 TABLET BY MOUTH EVERY DAY 90 tablet 3  . aspirin 81 MG EC tablet Take 81 mg by mouth daily.     . clonazePAM (KLONOPIN) 0.5 MG tablet TAKE 1 TABLET (0.5 MG TOTAL) BY MOUTH 3 (THREE) TIMES DAILY AS NEEDED FOR ANXIETY. 90 tablet 3  . clopidogrel (PLAVIX) 75 MG tablet Take 1 tablet (75 mg total) by mouth daily. 90 tablet 3  . Eszopiclone 3 MG TABS Take 1 tablet (3 mg total) by mouth at bedtime. 30 tablet 3  . ezetimibe (ZETIA) 10 MG tablet TAKE 1 TABLET BY MOUTH EVERY DAY 90 tablet 3  . isosorbide mononitrate (IMDUR) 30 MG 24 hr tablet Take 30 mg in the morning, and 15 mg in the evening. 180 tablet 1  . metoprolol succinate (TOPROL XL) 25 MG 24 hr tablet Take 1.5 tablets (37.5 mg total) by mouth daily. 90 tablet 3  . Omega-3 Fatty Acids (FISH OIL) 1000 MG CAPS Take 1,000 mg by mouth daily.     . Sodium Sulfate-Mag Sulfate-KCl (SUTAB) [phone] MG TABS Take 1 kit by mouth as directed. 24 tablet 0  . losartan (COZAAR) 25 MG tablet Take 0.5 tablets (12.5 mg total) by mouth daily. 45 tablet 3  . nitroGLYCERIN (NITROSTAT) 0.4 MG SL tablet Place 1 tablet (0.4 mg total)  under the tongue every 5 (five) minutes as needed for chest pain. 25 tablet 2  . Pitavastatin Calcium (LIVALO) 1 MG TABS Take 1 tablet (1 mg total) by mouth daily. (Patient not taking: Reported on 06/22/2020) 30 tablet 2   No current facility-administered medications on file prior to visit.   Past Medical History:  Diagnosis Date  . Arthritis   . Coronary artery disease    2 stents  . Diverticulosis   . Hard of hearing   . Hypertension    hx of elevation on lyrica, off med and now normal   . Wears glasses    Past Surgical History:  Procedure Laterality Date  . CARDIAC CATHETERIZATION     with 2 stents   . COLECTOMY  05/2018  . EYE SURGERY  02/2020   bilaterl cataracts  . PROSTATECTOMY    . TOTAL HIP ARTHROPLASTY Left 11/10/2015   Procedure: LEFT TOTAL HIP ARTHROPLASTY ANTERIOR APPROACH;  Surgeon: Mcarthur Rossetti, MD;  Location: [facility];  Service: Orthopedics;  Laterality: Left;    Family History  Problem Relation Age of Onset  . Alzheimer's disease Mother   . Stroke Father   . Diabetes Sister   . Heart attack Maternal Grandmother   . Cancer Maternal Uncle        prostate  .  Prostate cancer Maternal Uncle   . Cancer Maternal Uncle        prostate  . Prostate cancer Maternal Uncle   . Colon polyps Brother   . Colon cancer Neg Hx   . Esophageal cancer Neg Hx   . Rectal cancer Neg Hx   . Stomach cancer Neg Hx    Social History   Socioeconomic History  . Marital status: Married    Spouse name: Not on file  . Number of children: 0  . Years of education: Not on file  . Highest education level: Not on file  Occupational History  . Occupation: Employed at [employer]  Tobacco Use  . Smoking status: Never Smoker  . Smokeless tobacco: Never Used  Vaping Use  . Vaping Use: Former  Substance and Sexual Activity  . Alcohol use: Not Currently    Alcohol/week: 0.0 standard drinks    Comment: rarely  . Drug use: No  . Sexual activity: Yes    Partners: Female     Birth control/protection: None  Other Topics Concern  . Not on file  Social History Narrative  . Not on file   Social Determinants of Health   Financial Resource Strain: Not on file  Food Insecurity: Not on file  Transportation Needs: Not on file  Physical Activity: Not on file  Stress: Not on file  Social Connections: Not on file    Review of Systems  Constitutional: Negative for activity change and appetite change.  HENT: Negative for congestion and sinus pain.   Eyes: Negative for visual disturbance.  Respiratory: Negative for chest tightness and shortness of breath.   Cardiovascular: Negative for chest pain, palpitations and leg swelling.  Gastrointestinal: Negative for abdominal distention and abdominal pain.  Genitourinary: Negative for difficulty urinating and urgency.  Musculoskeletal: Negative for arthralgias and back pain.  Neurological: Negative.      Objective:  BP (!) 144/80   Pulse 63   Temp 97.6 F (36.4 C)   Resp 16   Ht _0  (1.803 m)   Wt 227 lb (103 kg)   SpO2 97%   BMI 31.66 kg/m   BP/Weight 06/22/2020 06/12/2020 6/59/9357  Systolic BP 017 793 903  Diastolic BP 80 68 64  Wt. (Lbs) 227 226 230  BMI 31.66 31.52 32.08    Physical Exam Vitals reviewed.  Constitutional:      Appearance: Normal appearance.  HENT:     Head: Normocephalic and atraumatic.     Right Ear: Tympanic membrane normal.     Left Ear: Tympanic membrane normal.  Cardiovascular:     Rate and Rhythm: Normal rate and regular rhythm.     Pulses: Normal pulses.     Heart sounds: Normal heart sounds. No murmur heard. No gallop.   Pulmonary:     Effort: Pulmonary effort is normal. No respiratory distress.     Breath sounds: Normal breath sounds. No rales.  Musculoskeletal:        General: Tenderness (lumbar spine) present.  Neurological:     Mental Status: He is alert.       Lab Results  Component Value Date   WBC 10.0 05/18/2020   HGB 18.4 Repeated and verified  X2. ([place]) 05/18/2020   HCT 54.5 (H) 05/18/2020   PLT 168.0 05/18/2020   GLUCOSE 80 04/01/2020   CHOL 108 09/13/2019   TRIG 119 09/13/2019   HDL 28 (L) 09/13/2019   LDLCALC 58 09/13/2019   ALT 50 (H) 04/01/2020  AST 34 04/01/2020   NA 140 04/01/2020   K 5.1 04/01/2020   CL 104 04/01/2020   CREATININE 1.19 04/01/2020   BUN 12 04/01/2020   CO2 22 04/01/2020   INR 1.03 06/24/2009   HGBA1C 5.8 (H) 11/28/2019      Assessment & Plan:   Diagnoses and all orders for this visit: Sciatica without back pain, left -     AMB referral to orthopedics -     celecoxib (CELEBREX) 200 MG capsule; Take 1 capsule (200 mg total) by mouth 2 (two) times daily. patient has a chronic history of LBP especially on left.  It appears peraspinal and not hip related.  Refer to orthopedics  Polycythemia -     CBC with Differential/Platelet -     Comprehensive metabolic panel Patient has polycythemia that corresponds to his testosterone use, unknown dosage.  Hypogonadism in male -     Testosterone Free, Profile I Patient is using illicit testosterone from unknown source every 10 days injections. Despite having prostate cancer. Last injection 3 days ago.  Prostate cancer [facility]) -     PSA Patient had a total prostatectomy in past.         Follow-up: Return if symptoms worsen or fail to improve.  An After Visit Summary was printed and given to the patient.  Reinaldo Meeker, MD [facility] [phone]

## 2020-06-23 LAB — CBC WITH DIFFERENTIAL/PLATELET
Basophils Absolute: 0 10*3/uL (ref 0.0–0.2)
Basos: 1 %
EOS (ABSOLUTE): 0.3 10*3/uL (ref 0.0–0.4)
Eos: 4 %
Hematocrit: 53.4 % — ABNORMAL HIGH (ref 37.5–51.0)
Hemoglobin: 18 g/dL — ABNORMAL HIGH (ref 13.0–17.7)
Immature Grans (Abs): 0 10*3/uL (ref 0.0–0.1)
Immature Granulocytes: 0 %
Lymphocytes Absolute: 1.6 10*3/uL (ref 0.7–3.1)
Lymphs: 22 %
MCH: 29.5 pg (ref 26.6–33.0)
MCHC: 33.7 g/dL (ref 31.5–35.7)
MCV: 88 fL (ref 79–97)
Monocytes Absolute: 0.4 10*3/uL (ref 0.1–0.9)
Monocytes: 6 %
Neutrophils Absolute: 4.7 10*3/uL (ref 1.4–7.0)
Neutrophils: 67 %
Platelets: 245 10*3/uL (ref 150–450)
RBC: 6.1 x10E6/uL — ABNORMAL HIGH (ref 4.14–5.80)
RDW: 14.5 % (ref 11.6–15.4)
WBC: 7 10*3/uL (ref 3.4–10.8)

## 2020-06-23 LAB — COMPREHENSIVE METABOLIC PANEL
ALT: 30 IU/L (ref 0–44)
AST: 18 IU/L (ref 0–40)
Albumin/Globulin Ratio: 1.9 (ref 1.2–2.2)
Albumin: 4.2 g/dL (ref 3.8–4.8)
Alkaline Phosphatase: 68 IU/L (ref 44–121)
BUN/Creatinine Ratio: 14 (ref 10–24)
BUN: 12 mg/dL (ref 8–27)
Bilirubin Total: 0.3 mg/dL (ref 0.0–1.2)
CO2: 17 mmol/L — ABNORMAL LOW (ref 20–29)
Calcium: 9.5 mg/dL (ref 8.6–10.2)
Chloride: 106 mmol/L (ref 96–106)
Creatinine, Ser: 0.83 mg/dL (ref 0.76–1.27)
Globulin, Total: 2.2 g/dL (ref 1.5–4.5)
Glucose: 101 mg/dL — ABNORMAL HIGH (ref 65–99)
Potassium: 4.4 mmol/L (ref 3.5–5.2)
Sodium: 138 mmol/L (ref 134–144)
Total Protein: 6.4 g/dL (ref 6.0–8.5)
eGFR: 97 mL/min/{1.73_m2} (ref >59)

## 2020-06-23 LAB — PSA: Prostate Specific Ag, Serum: <0.1 ng/mL (ref 0.0–4.0)

## 2020-06-23 LAB — TESTOSTERONE FREE, PROFILE I
Sex Hormone Binding: 39.6 nmol/L (ref 19.3–76.4)
Testost., Free, Calc: 239.5 pg/mL — ABNORMAL HIGH (ref 34.7–150.3)
Testosterone: 1067 ng/dL — ABNORMAL HIGH (ref 264–916)

## 2020-06-29 ENCOUNTER — Telehealth: Payer: Self-pay | Admitting: Physician Assistant

## 2020-06-29 NOTE — Telephone Encounter (Signed)
Should be ok for procedure same day

## 2020-06-29 NOTE — Telephone Encounter (Signed)
Pt has an appt with Dr. Charlett Blake (surgeon) this Wednesday to get an injection because he has been having knee pain. Pt is scheduled for colon the same day with Dr. Hilarie Fredrickson so wants to know if this is ok or if he needs to r/s. Pls call him.

## 2020-06-29 NOTE — Telephone Encounter (Signed)
Patient advised.

## 2020-07-01 ENCOUNTER — Ambulatory Visit (AMBULATORY_SURGERY_CENTER): Payer: Managed Care, Other (non HMO) | Admitting: Internal Medicine

## 2020-07-01 ENCOUNTER — Ambulatory Visit (INDEPENDENT_AMBULATORY_CARE_PROVIDER_SITE_OTHER): Payer: Managed Care, Other (non HMO) | Admitting: Physician Assistant

## 2020-07-01 ENCOUNTER — Encounter: Payer: Self-pay | Admitting: Physician Assistant

## 2020-07-01 ENCOUNTER — Other Ambulatory Visit: Payer: Self-pay | Admitting: Internal Medicine

## 2020-07-01 ENCOUNTER — Encounter: Payer: Self-pay | Admitting: Internal Medicine

## 2020-07-01 ENCOUNTER — Other Ambulatory Visit: Payer: Self-pay

## 2020-07-01 VITALS — BP 114/67 | HR 61 | Temp 98.4°F | Resp 15 | Ht 71.0 in | Wt 220.0 lb

## 2020-07-01 DIAGNOSIS — M545 Low back pain, unspecified: Secondary | ICD-10-CM | POA: Diagnosis not present

## 2020-07-01 DIAGNOSIS — I25119 Atherosclerotic heart disease of native coronary artery with unspecified angina pectoris: Secondary | ICD-10-CM

## 2020-07-01 DIAGNOSIS — K5140 Inflammatory polyps of colon without complications: Secondary | ICD-10-CM

## 2020-07-01 DIAGNOSIS — K621 Rectal polyp: Secondary | ICD-10-CM | POA: Diagnosis not present

## 2020-07-01 DIAGNOSIS — D128 Benign neoplasm of rectum: Secondary | ICD-10-CM

## 2020-07-01 DIAGNOSIS — G8929 Other chronic pain: Secondary | ICD-10-CM

## 2020-07-01 DIAGNOSIS — Z86010 Personal history of colonic polyps: Secondary | ICD-10-CM

## 2020-07-01 DIAGNOSIS — M7062 Trochanteric bursitis, left hip: Secondary | ICD-10-CM

## 2020-07-01 DIAGNOSIS — K648 Other hemorrhoids: Secondary | ICD-10-CM

## 2020-07-01 DIAGNOSIS — D120 Benign neoplasm of cecum: Secondary | ICD-10-CM

## 2020-07-01 DIAGNOSIS — K5730 Diverticulosis of large intestine without perforation or abscess without bleeding: Secondary | ICD-10-CM

## 2020-07-01 DIAGNOSIS — Z860101 Personal history of adenomatous and serrated colon polyps: Secondary | ICD-10-CM

## 2020-07-01 MED ORDER — SODIUM CHLORIDE 0.9 % IV SOLN: 500.0000 mL | Freq: Once | INTRAVENOUS | Status: DC

## 2020-07-01 MED ORDER — METHYLPREDNISOLONE 4 MG PO TABS: ORAL_TABLET | ORAL | 0 refills | Status: DC

## 2020-07-01 NOTE — Progress Notes (Signed)
Office Visit Note   Patient: Jacob Collier           Date of Birth: 03-28-1955           MRN: [id number] Visit Date: 07/01/2020              Requested by: [name], MD [address],  [place] [address] PCP: [name], MD   Assessment & Plan: Visit Diagnoses:  1. Chronic left-sided low back pain without sciatica   2. Trochanteric bursitis, left hip     Plan: He is given a prescription for physical therapy.  They will work on core strengthening back exercises, hamstring stretching, IT band stretching include modalities and home exercise program.  We will place him on a Medrol Dosepak.  Having follow-up with [place] in 4 weeks to see how he is doing.  Questions encouraged and answered at length.  Follow-Up Instructions: Return in about 4 weeks (around 07/29/2020).   Orders:  No orders of the defined types were placed in this encounter.  Meds ordered this encounter  Medications  . methylPREDNISolone (MEDROL) 4 MG tablet    Sig: Take as directed    Dispense:  21 tablet    Refill:  0      Procedures: No procedures performed   Clinical Data: No additional findings.   Subjective: Chief Complaint  Patient presents with  . Left Hip - Pain  . Lower Back - Pain    HPI Jacob Collier 65 year old male well-known to Dr. Ninfa Linden service comes in today with low back pain has been ongoing for the past 18 months no known injury.  He denies any numbness tingling down either leg.  Back pain does awaken him and it radiates from his low back to his left hip and buttocks area.  No bowel or bladder dysfunction.  Did undergo a left total hip arthroplasty by Dr. Ninfa Linden 2017 has done well.  He notes that he has had SI joint injections in the past that have helped and also has had trochanteric injections in the past that have helped.  He has been given Celebrex by one of his providers with is cautious about taking it as he is on Plavix. Radiographs lumbar  spine multiple views are reviewed and show no acute fractures.  There is endplate spurring throughout.  Degenerative disc changes most notable L2-3 and L5-S1.  Lower lumbar facet arthritic changes.  Review of Systems Negative for fevers or chills.  Please see HPI otherwise negative  Objective: Vital Signs: There were no vitals taken for this visit.  Physical Exam Constitutional:      Appearance: He is not ill-appearing or diaphoretic.  Neurological:     Mental Status: He is alert and oriented to person, place, and time.  Psychiatric:        Mood and Affect: Mood normal.     Ortho Exam Bilateral feet full sensation.  Negative straight leg raise bilaterally tight hamstrings bilaterally.  5 out of 5 strength throughout lower extremities against resistance.  Good range of motion of both hips tenderness over the left hip trochanteric region.  Tenderness over the left SI joint region and lower lumbar sacral paraspinous region on the left only.  He has limited forward flexion extension lumbar spine.  Negative Faber's bilaterally.   Specialty Comments:  No specialty comments available.  Imaging: No results found.   PMFS History: Patient Active Problem List   Diagnosis Date Noted  .  Polycythemia 06/22/2020  . Chronic arthralgias of knees and hips 05/19/2020  . Myalgia due to statin 05/19/2020  . Dehydration 03/16/2020  . Bronchial pneumonia 02/03/2020  . Bursitis of left shoulder 11/28/2019  . ED (erectile dysfunction) 10/22/2019  . Sciatica without back pain, left 10/22/2019  . History of total left hip replacement 10/10/2019  . Primary insomnia 07/05/2019  . Anxiety   . Unstable angina pectoris due to coronary arteriosclerosis ([place]) 05/21/2019  . Allergic rhinitis 05/16/2019  . Hyperlipidemia LDL goal <70 05/16/2019  . H/O heart artery stent 01/18/2019  . Sigmoid stricture ([place]) 04/13/2018  . Other fatigue 07/05/2016  . Osteoarthritis of left hip 11/10/2015  . Status post  left hip replacement 11/10/2015  . Diverticulosis 09/24/2014  . Prostate cancer ([place]) 09/24/2014  . Chronic back pain 09/24/2014  . Dyspnea 06/06/2014  . Murmur 06/06/2014   Past Medical History:  Diagnosis Date  . Arthritis   . Coronary artery disease    2 stents  . Diverticulosis   . Hard of hearing   . Hypertension    hx of elevation on lyrica, off med and now normal   . Wears glasses     Family History  Problem Relation Age of Onset  . Alzheimer's disease Mother   . Stroke Father   . Diabetes Sister   . Heart attack Maternal Grandmother   . Cancer Maternal Uncle        prostate  . Prostate cancer Maternal Uncle   . Cancer Maternal Uncle        prostate  . Prostate cancer Maternal Uncle   . Colon polyps Brother   . Colon cancer Neg Hx   . Esophageal cancer Neg Hx   . Rectal cancer Neg Hx   . Stomach cancer Neg Hx     Past Surgical History:  Procedure Laterality Date  . CARDIAC CATHETERIZATION     with 2 stents   . COLECTOMY  05/2018  . EYE SURGERY  02/2020   bilaterl cataracts  . PROSTATECTOMY    . TOTAL HIP ARTHROPLASTY Left 11/10/2015   Procedure: LEFT TOTAL HIP ARTHROPLASTY ANTERIOR APPROACH;  Surgeon: Mcarthur Rossetti, MD;  Location: [facility];  Service: Orthopedics;  Laterality: Left;   Social History   Occupational History  . Occupation: Employed at [employer]  Tobacco Use  . Smoking status: Never Smoker  . Smokeless tobacco: Never Used  Vaping Use  . Vaping Use: Former  Substance and Sexual Activity  . Alcohol use: Not Currently    Alcohol/week: 0.0 standard drinks    Comment: rarely  . Drug use: No  . Sexual activity: Yes    Partners: Female    Birth control/protection: None

## 2020-07-01 NOTE — Patient Instructions (Signed)
YOU HAD AN ENDOSCOPIC PROCEDURE TODAY AT THE [facility]:   Refer to the procedure report that was given to you for any specific questions about what was found during the examination.  If the procedure report does not answer your questions, please call your gastroenterologist to clarify.  If you requested that your care partner not be given the details of your procedure findings, then the procedure report has been included in a sealed envelope for you to review at your convenience later.  YOU SHOULD EXPECT: Some feelings of bloating in the abdomen. Passage of more gas than usual.  Walking can help get rid of the air that was put into your GI tract during the procedure and reduce the bloating. If you had a lower endoscopy (such as a colonoscopy or flexible sigmoidoscopy) you may notice spotting of blood in your stool or on the toilet paper. If you underwent a bowel prep for your procedure, you may not have a normal bowel movement for a few days.  Please Note:  You might notice some irritation and congestion in your nose or some drainage.  This is from the oxygen used during your procedure.  There is no need for concern and it should clear up in a day or so.  SYMPTOMS TO REPORT IMMEDIATELY:   Following lower endoscopy (colonoscopy or flexible sigmoidoscopy):  Excessive amounts of blood in the stool  Significant tenderness or worsening of abdominal pains  Swelling of the abdomen that is new, acute  Fever of 100F or higher   Following upper endoscopy (EGD)  Vomiting of blood or coffee ground material  New chest pain or pain under the shoulder blades  Painful or persistently difficult swallowing  New shortness of breath  Fever of 100F or higher  Black, tarry-looking stools  For urgent or emergent issues, a gastroenterologist can be reached at any hour by calling [phone]. Do not use MyChart messaging for urgent concerns.    DIET:  We do recommend a small meal at first, but  then you may proceed to your regular diet.  Drink plenty of fluids but you should avoid alcoholic beverages for 24 hours.  ACTIVITY:  You should plan to take it easy for the rest of today and you should NOT DRIVE or use heavy machinery until tomorrow (because of the sedation medicines used during the test).    FOLLOW UP: Our staff will call the number listed on your records 48-72 hours following your procedure to check on you and address any questions or concerns that you may have regarding the information given to you following your procedure. If we do not reach you, we will leave a message.  We will attempt to reach you two times.  During this call, we will ask if you have developed any symptoms of COVID 19. If you develop any symptoms (ie: fever, flu-like symptoms, shortness of breath, cough etc.) before then, please call [phone].  If you test positive for Covid 19 in the 2 weeks post procedure, please call and report this information to us.    If any biopsies were taken you will be contacted by phone or by letter within the next 1-3 weeks.  Please call us at [phone] if you have not heard about the biopsies in 3 weeks.    SIGNATURES/CONFIDENTIALITY: You and/or your care partner have signed paperwork which will be entered into your electronic medical record.  These signatures attest to the fact that that the information above on   your After Visit Summary has been reviewed and is understood.  Full responsibility of the confidentiality of this discharge information lies with you and/or your care-partner. 

## 2020-07-01 NOTE — Op Note (Signed)
[facility] Patient Name: Jacob Collier Procedure Date: 07/01/2020 3:46 PM MRN: [id number] Endoscopist: Jerene Bears , MD Age: 65 Referring MD:  Date of Birth: [birth year] Gender: Male Account #: [id number] Procedure:                Colonoscopy Indications:              Abdominal pain in the left side, history of sigmoid                            diverticulosis/diverticulitis with colonic                            narrowing s/p sigmoid resection in March 2020,                            question of blood in stool, last colonoscopy Nov                            2019 with non-advanced adenoma Medicines:                Propofol per Anesthesia Procedure:                Pre-Anesthesia Assessment:                           - Prior to the procedure, a History and Physical                            was performed, and patient medications and                            allergies were reviewed. The patient's tolerance of                            previous anesthesia was also reviewed. The risks                            and benefits of the procedure and the sedation                            options and risks were discussed with the patient.                            All questions were answered, and informed consent                            was obtained. Prior Anticoagulants: The patient has                            taken Plavix (clopidogrel), last dose was 5 days                            prior to procedure. ASA Grade Assessment: II - A  patient with mild systemic disease. After reviewing                            the risks and benefits, the patient was deemed in                            satisfactory condition to undergo the procedure.                           After obtaining informed consent, the colonoscope                            was passed under direct vision. Throughout the                            procedure, the patient's blood  pressure, pulse, and                            oxygen saturations were monitored continuously. The                            Olympus CF-[id number]) Colonoscope was                            introduced through the anus and advanced to the                            cecum, identified by appendiceal orifice and                            ileocecal valve. The colonoscopy was performed                            without difficulty. The patient tolerated the                            procedure well. The quality of the bowel                            preparation was good. The ileocecal valve,                            appendiceal orifice, and rectum were photographed. Scope In: 4:05:22 PM Scope Out: 4:26:02 PM Scope Withdrawal Time: 0 hours 15 minutes 25 seconds  Total Procedure Duration: 0 hours 20 minutes 40 seconds  Findings:                 The digital rectal exam was normal.                           A 1 mm polyp was found in the cecum. The polyp was                            sessile. The polyp was removed with a cold  biopsy                            forceps. Resection and retrieval were complete.                           A 5 mm polyp was found in the rectum. The polyp was                            sessile. The polyp was removed with a cold snare.                            Resection and retrieval were complete.                           There was evidence of a prior end-to-end                            colo-colonic anastomosis in the sigmoid colon. This                            was patent and was characterized by healthy                            appearing mucosa.                           Multiple large-mouthed diverticula were found in                            the descending colon.                           Internal hemorrhoids were found during                            retroflexion. The hemorrhoids were small. Complications:            No immediate  complications. Estimated Blood Loss:     Estimated blood loss was minimal. Impression:               - One 1 mm polyp in the cecum, removed with a cold                            biopsy forceps. Resected and retrieved.                           - One 5 mm polyp in the rectum, removed with a cold                            snare. Resected and retrieved.                           - Patent end-to-end colo-colonic anastomosis,  characterized by healthy appearing mucosa. No                            evidence of stricture or narrowing.                           - Diverticulosis in the descending colon.                           - Internal hemorrhoids. Recommendation:           - Patient has a contact number available for                            emergencies. The signs and symptoms of potential                            delayed complications were discussed with the                            patient. Return to normal activities tomorrow.                            Written discharge instructions were provided to the                            patient.                           - Resume previous diet.                           - Continue present medications.                           - Resume Plavix (clopidogrel) at prior dose                            tomorrow. Refer to managing physician for further                            adjustment of therapy.                           - Await pathology results.                           - If recurrent rectal bleeding then could consider                            hemorrhoidal banding in the future.                           - Repeat colonoscopy is recommended for                            surveillance. The colonoscopy date will be  determined after pathology results from today's                            exam become available for review. Jerene Bears, MD 07/01/2020 4:32:27 PM This report has been  signed electronically.

## 2020-07-01 NOTE — Progress Notes (Signed)
Called to room to assist during endoscopic procedure.  Patient ID and intended procedure confirmed with present staff. Received instructions for my participation in the procedure from the performing physician.  

## 2020-07-01 NOTE — Progress Notes (Signed)
A and O x3. Report to RN. Tolerated MAC anesthesia well.

## 2020-07-01 NOTE — Progress Notes (Signed)
VS- JD 

## 2020-07-03 ENCOUNTER — Telehealth: Payer: Self-pay

## 2020-07-03 NOTE — Telephone Encounter (Signed)
Attempted to reach patient for post-procedure f/u call. No answer. Left message for him to please not hesitate to call us if he has any questions/concerns regarding his care. 

## 2020-07-03 NOTE — Telephone Encounter (Signed)
Left message on follow up call. 

## 2020-07-10 ENCOUNTER — Encounter: Payer: Self-pay | Admitting: Internal Medicine

## 2020-07-15 ENCOUNTER — Other Ambulatory Visit: Payer: Self-pay | Admitting: Cardiovascular Disease

## 2020-07-16 ENCOUNTER — Other Ambulatory Visit: Payer: Self-pay | Admitting: Cardiovascular Disease

## 2020-07-22 ENCOUNTER — Ambulatory Visit (HOSPITAL_COMMUNITY): Payer: Managed Care, Other (non HMO) | Attending: Internal Medicine

## 2020-07-22 ENCOUNTER — Other Ambulatory Visit: Payer: Self-pay

## 2020-07-22 DIAGNOSIS — Z87891 Personal history of nicotine dependence: Secondary | ICD-10-CM | POA: Diagnosis not present

## 2020-07-22 DIAGNOSIS — I517 Cardiomegaly: Secondary | ICD-10-CM | POA: Diagnosis not present

## 2020-07-22 DIAGNOSIS — I10 Essential (primary) hypertension: Secondary | ICD-10-CM | POA: Insufficient documentation

## 2020-07-22 DIAGNOSIS — I358 Other nonrheumatic aortic valve disorders: Secondary | ICD-10-CM | POA: Diagnosis not present

## 2020-07-22 DIAGNOSIS — I340 Nonrheumatic mitral (valve) insufficiency: Secondary | ICD-10-CM | POA: Diagnosis not present

## 2020-07-22 LAB — ECHOCARDIOGRAM COMPLETE
Area-P 1/2: 2.55 cm2
S' Lateral: 4 cm

## 2020-07-29 ENCOUNTER — Ambulatory Visit: Payer: Managed Care, Other (non HMO) | Admitting: Orthopaedic Surgery

## 2020-07-30 ENCOUNTER — Ambulatory Visit: Payer: Managed Care, Other (non HMO) | Admitting: Cardiovascular Disease

## 2020-08-03 ENCOUNTER — Other Ambulatory Visit: Payer: Self-pay

## 2020-08-03 ENCOUNTER — Emergency Department (HOSPITAL_COMMUNITY): Payer: Managed Care, Other (non HMO)

## 2020-08-03 ENCOUNTER — Encounter (HOSPITAL_COMMUNITY): Payer: Self-pay

## 2020-08-03 ENCOUNTER — Emergency Department (HOSPITAL_COMMUNITY)
Admission: EM | Admit: 2020-08-03 | Discharge: 2020-08-03 | Disposition: A | Discharge status: Home / Self Care | Payer: Managed Care, Other (non HMO) | Attending: Emergency Medicine | Admitting: Emergency Medicine

## 2020-08-03 DIAGNOSIS — I10 Essential (primary) hypertension: Secondary | ICD-10-CM | POA: Diagnosis not present

## 2020-08-03 DIAGNOSIS — Z951 Presence of aortocoronary bypass graft: Secondary | ICD-10-CM | POA: Insufficient documentation

## 2020-08-03 DIAGNOSIS — R0789 Other chest pain: Secondary | ICD-10-CM | POA: Diagnosis present

## 2020-08-03 DIAGNOSIS — R0600 Dyspnea, unspecified: Secondary | ICD-10-CM | POA: Insufficient documentation

## 2020-08-03 DIAGNOSIS — Z96642 Presence of left artificial hip joint: Secondary | ICD-10-CM | POA: Insufficient documentation

## 2020-08-03 DIAGNOSIS — Z7982 Long term (current) use of aspirin: Secondary | ICD-10-CM | POA: Diagnosis not present

## 2020-08-03 DIAGNOSIS — I25119 Atherosclerotic heart disease of native coronary artery with unspecified angina pectoris: Secondary | ICD-10-CM

## 2020-08-03 DIAGNOSIS — Z7902 Long term (current) use of antithrombotics/antiplatelets: Secondary | ICD-10-CM | POA: Diagnosis not present

## 2020-08-03 DIAGNOSIS — Z8546 Personal history of malignant neoplasm of prostate: Secondary | ICD-10-CM | POA: Diagnosis not present

## 2020-08-03 DIAGNOSIS — Z79899 Other long term (current) drug therapy: Secondary | ICD-10-CM | POA: Diagnosis not present

## 2020-08-03 LAB — TROPONIN I (HIGH SENSITIVITY)
Troponin I (High Sensitivity): 9 ng/L (ref <18)
Troponin I (High Sensitivity): 10 ng/L (ref <18)

## 2020-08-03 LAB — CBC
HCT: 57.5 % — ABNORMAL HIGH (ref 39.0–52.0)
Hemoglobin: 19.3 g/dL — ABNORMAL HIGH (ref 13.0–17.0)
MCH: 29.8 pg (ref 26.0–34.0)
MCHC: 33.6 g/dL (ref 30.0–36.0)
MCV: 88.7 fL (ref 80.0–100.0)
Platelets: 187 10*3/uL (ref 150–400)
RBC: 6.48 MIL/uL — ABNORMAL HIGH (ref 4.22–5.81)
RDW: 16.3 % — ABNORMAL HIGH (ref 11.5–15.5)
WBC: 8.8 10*3/uL (ref 4.0–10.5)
nRBC: 0 % (ref 0.0–0.2)

## 2020-08-03 LAB — BASIC METABOLIC PANEL
Anion gap: 7 (ref 5–15)
BUN: 14 mg/dL (ref 8–23)
CO2: 22 mmol/L (ref 22–32)
Calcium: 9.1 mg/dL (ref 8.9–10.3)
Chloride: 105 mmol/L (ref 98–111)
Creatinine, Ser: 1.11 mg/dL (ref 0.61–1.24)
GFR, Estimated: >60 mL/min (ref >60)
Glucose, Bld: 94 mg/dL (ref 70–99)
Potassium: 5.2 mmol/L — ABNORMAL HIGH (ref 3.5–5.1)
Sodium: 134 mmol/L — ABNORMAL LOW (ref 135–145)

## 2020-08-03 MED ORDER — NITROGLYCERIN 0.4 MG SL SUBL: 0.4000 mg | SUBLINGUAL_TABLET | SUBLINGUAL | Status: DC | PRN

## 2020-08-03 MED ORDER — LORAZEPAM 2 MG/ML IJ SOLN
1.0000 mg | Freq: Once | INTRAMUSCULAR | Status: AC
  Administered 2020-08-03: 1 mg via INTRAVENOUS
  Filled 2020-08-03: qty 1

## 2020-08-03 NOTE — ED Provider Notes (Signed)
I assumed care of patient at shift change from previous team.  Please see note from previous team for full H and P.  Briefly patient is here for chest pain.    Per Margarita Mail PA-C "Jacob Collier is a 65 y.o. male who presents wit c/o chest pain. He has a pmh of known CAD and per patient, states that he has a history of 2 100% occluded coronary vessels.  He had stents placed however back in December 2021 he found out that they were completely occluded.  The patient sees Dr. [name] and has been treated medically since that time.  He states that over the past month he has had some intermittent episodes of exertional dyspnea followed by episodes of chest discomfort and dyspnea both at rest and with exertion and then has been feeling "really exhausted and terrible" over the past 2 days and has been mostly in bed.  Yesterday the patient had 2 episodes of mild to moderate chest pressure and discomfort with pain and paresthesia of the left upper extremity between the elbow and shoulder for which he took nitroglycerin twice.  He states that he has not had to take nitroglycerin since before he had his first heart catheterization.  Patient states that this morning his symptoms were very intense and were worsened walking from his car into the emergency department but improved when he sat down and rested.  He still has symptoms which he rates as about a 6 out of 10.  He denies any nausea, vomiting, diaphoresis.  He is very concerned that this is related to ACS.  He denies fevers, chills, cough."  Also  "65 year old male with known extensive coronary artery disease.  He is here with increasing frequency of anginal symptoms also increasing intensity. The emergent differential diagnosis of chest pain includes: Acute coronary syndrome, pericarditis, aortic dissection, pulmonary embolism, tension pneumothorax, pneumonia, and esophageal rupture. I ordered and reviewed work-up for ACS with patient's history.  BMP with  slightly elevated potassium level likely some mild hemolysis and does not need any interventions.  CBC shows chronic polycythemia.  Initial troponin is within normal limits.  Patient's 2 view chest x-ray I reviewed and interpreted shows no acute abnormalities, EKG shows sinus bradycardia without any changes suggestive of acute ischemia.  I discussed the case with Dr. Marlou Porch by telephone.  Currently he suggests a second troponin and increase in his Imdur from 30 mg to 60 mg daily if there is no change in his troponin change in his clinical status.  I have reassessed the patient and discussed his current work-up and plan.  He is well-appearing without his symptoms of angina.  He understands that he will be discharged with medication changes and strict return precautions and close follow-up with cardiology showed his second troponin returned within normal limits.  I have given signout to [name] Rayburn who will follow up on lab work."   Patients delta troponin was 10, initial was 9.  This is not a significant change.  He feels well, is resting comfortably and is agreeable to go home.  He is aware of plan.   Return precautions were discussed with patient who states their understanding.  At the time of discharge patient denied any unaddressed complaints or concerns.  Patient is agreeable for discharge home.  Note: Portions of this report may have been transcribed using voice recognition software. Every effort was made to ensure accuracy; however, inadvertent computerized transcription errors may be present  Lorin Glass, [place] 08/03/20 1619    Tegeler, Gwenyth Allegra, MD 08/03/20 Lurline Hare

## 2020-08-03 NOTE — ED Provider Notes (Signed)
[facility] EMERGENCY DEPARTMENT Provider Note   CSN: [id number] Arrival date & time: 08/03/20  1221     History Chief Complaint  Patient presents with  . Chest Pain    Jacob Collier Jacob Collier is a 65 y.o. male who presents wit c/o chest pain. He has a pmh of known CAD and per patient, states that he has a history of 2 100% occluded coronary vessels.  He had stents placed however back in December 2021 he found out that they were completely occluded.  The patient sees Dr. [name] and has been treated medically since that time.  He states that over the past month he has had some intermittent episodes of exertional dyspnea followed by episodes of chest discomfort and dyspnea both at rest and with exertion and then has been feeling "really exhausted and terrible" over the past 2 days and has been mostly in bed.  Yesterday the patient had 2 episodes of mild to moderate chest pressure and discomfort with pain and paresthesia of the left upper extremity between the elbow and shoulder for which he took nitroglycerin twice.  He states that he has not had to take nitroglycerin since before he had his first heart catheterization.  Patient states that this morning his symptoms were very intense and were worsened walking from his car into the emergency department but improved when he sat down and rested.  He still has symptoms which he rates as about a 6 out of 10.  He denies any nausea, vomiting, diaphoresis.  He is very concerned that this is related to ACS.  He denies fevers, chills, cough.  From note on 06/12/20 by Dr. [name]: "He is status post PCI to the proximal to mid RCA at catheterization on January 04, 2019 and had known CTO of his circumflex.  Suddenly he had undergone repeat catheterization in March 2021 showing in-stent restenosis of his RCA stent which progressed to chronic total occlusion as well as chronic total occlusion of his circumflex."  HPI     Past Medical History:  Diagnosis  Date  . Arthritis   . Coronary artery disease    2 stents  . Diverticulosis   . Hard of hearing   . Hypertension    hx of elevation on lyrica, off med and now normal   . Wears glasses     Patient Active Problem List   Diagnosis Date Noted  . Polycythemia 06/22/2020  . Chronic arthralgias of knees and hips 05/19/2020  . Myalgia due to statin 05/19/2020  . Dehydration 03/16/2020  . Bronchial pneumonia 02/03/2020  . Bursitis of left shoulder 11/28/2019  . ED (erectile dysfunction) 10/22/2019  . Sciatica without back pain, left 10/22/2019  . History of total left hip replacement 10/10/2019  . Primary insomnia 07/05/2019  . Anxiety   . Unstable angina pectoris due to coronary arteriosclerosis ([place]) 05/21/2019  . Allergic rhinitis 05/16/2019  . Hyperlipidemia LDL goal <70 05/16/2019  . H/O heart artery stent 01/18/2019  . Sigmoid stricture ([place]) 04/13/2018  . Other fatigue 07/05/2016  . Osteoarthritis of left hip 11/10/2015  . Status post left hip replacement 11/10/2015  . Diverticulosis 09/24/2014  . Prostate cancer ([place]) 09/24/2014  . Chronic back pain 09/24/2014  . Dyspnea 06/06/2014  . Murmur 06/06/2014    Past Surgical History:  Procedure Laterality Date  . CARDIAC CATHETERIZATION     with 2 stents   . COLECTOMY  05/2018  . EYE SURGERY  02/2020   bilaterl cataracts  .  PROSTATECTOMY    . TOTAL HIP ARTHROPLASTY Left 11/10/2015   Procedure: LEFT TOTAL HIP ARTHROPLASTY ANTERIOR APPROACH;  Surgeon: Mcarthur Rossetti, MD;  Location: [facility];  Service: Orthopedics;  Laterality: Left;       Family History  Problem Relation Age of Onset  . Alzheimer's disease Mother   . Stroke Father   . Diabetes Sister   . Heart attack Maternal Grandmother   . Cancer Maternal Uncle        prostate  . Prostate cancer Maternal Uncle   . Cancer Maternal Uncle        prostate  . Prostate cancer Maternal Uncle   . Colon polyps Brother   . Colon cancer Neg Hx   . Esophageal cancer  Neg Hx   . Rectal cancer Neg Hx   . Stomach cancer Neg Hx     Social History   Tobacco Use  . Smoking status: Never Smoker  . Smokeless tobacco: Never Used  Vaping Use  . Vaping Use: Former  Substance Use Topics  . Alcohol use: Not Currently    Alcohol/week: 0.0 standard drinks    Comment: rarely  . Drug use: No    Home Medications Prior to Admission medications   Medication Sig Start Date End Date Taking? Authorizing Provider  amLODipine (NORVASC) 2.5 MG tablet TAKE 1 TABLET BY MOUTH EVERY DAY 05/19/20   Troy Sine, MD  aspirin 81 MG EC tablet Take 81 mg by mouth daily.  12/31/18   [provider]  celecoxib (CELEBREX) 200 MG capsule Take 1 capsule (200 mg total) by mouth 2 (two) times daily. Patient not taking: Reported on 07/01/2020 06/22/20   [name], MD  clonazePAM (KLONOPIN) 0.5 MG tablet TAKE 1 TABLET (0.5 MG TOTAL) BY MOUTH 3 (THREE) TIMES DAILY AS NEEDED FOR ANXIETY. 05/16/20   [name], MD  clopidogrel (PLAVIX) 75 MG tablet Take 1 tablet (75 mg total) by mouth daily. 08/26/19   Sande Rives E, PA-C  Eszopiclone 3 MG TABS Take 1 tablet (3 mg total) by mouth at bedtime. 04/29/20   [name], MD  ezetimibe (ZETIA) 10 MG tablet TAKE 1 TABLET BY MOUTH EVERY DAY 04/21/20   Troy Sine, MD  isosorbide mononitrate (IMDUR) 30 MG 24 hr tablet Take 30 mg in the morning, and 15 mg in the evening. 06/12/20   Troy Sine, MD  LIVALO 1 MG TABS TAKE 1 TABLET BY MOUTH EVERY DAY 07/16/20   Troy Sine, MD  losartan (COZAAR) 25 MG tablet TAKE 1 TABLET BY MOUTH EVERY DAY 07/15/20   Troy Sine, MD  methylPREDNISolone (MEDROL) 4 MG tablet Take as directed 07/01/20   Pete Pelt, PA-C  metoprolol succinate (TOPROL XL) 25 MG 24 hr tablet Take 1.5 tablets (37.5 mg total) by mouth daily. 06/12/20   Troy Sine, MD  nitroGLYCERIN (NITROSTAT) 0.4 MG SL tablet Place 1 tablet (0.4 mg total) under the tongue every 5 (five) minutes  as needed for chest pain. 08/30/19 11/28/19  Darreld Mclean, PA-C  Omega-3 Fatty Acids (FISH OIL) 1000 MG CAPS Take 1,000 mg by mouth daily.     [provider]    Allergies    Ranolazine and Statins  Review of Systems   Review of Systems Ten systems reviewed and are negative for acute change, except as noted in the HPI.   Physical Exam Updated Vital Signs BP 128/74   Pulse (!) 45  Temp 98 F (36.7 C) (Oral)   Resp 16   Ht 5\' 11"  (1.803 m)   Wt 97.5 kg   SpO2 94%   BMI 29.99 kg/m   Physical Exam Vitals and nursing note reviewed.  Constitutional:      General: He is not in acute distress.    Appearance: He is well-developed. He is not diaphoretic.  HENT:     Head: Normocephalic and atraumatic.  Eyes:     General: No scleral icterus.    Conjunctiva/sclera: Conjunctivae normal.  Cardiovascular:     Rate and Rhythm: Normal rate and regular rhythm.     Heart sounds: Normal heart sounds.  Pulmonary:     Effort: Pulmonary effort is normal. No respiratory distress.     Breath sounds: Normal breath sounds.  Abdominal:     Palpations: Abdomen is soft.     Tenderness: There is no abdominal tenderness.  Musculoskeletal:     Cervical back: Normal range of motion and neck supple.  Skin:    General: Skin is warm and dry.  Neurological:     Mental Status: He is alert.  Psychiatric:        Behavior: Behavior normal.     ED Results / Procedures / Treatments   Labs (all labs ordered are listed, but only abnormal results are displayed) Labs Reviewed  BASIC METABOLIC PANEL - Abnormal; Notable for the following components:      Result Value   Sodium 134 (*)    Potassium 5.2 (*)    All other components within normal limits  CBC - Abnormal; Notable for the following components:   RBC 6.48 (*)    Hemoglobin 19.3 (*)    HCT 57.5 (*)    RDW 16.3 (*)    All other components within normal limits  TROPONIN I (HIGH SENSITIVITY)  TROPONIN I (HIGH SENSITIVITY)     EKG EKG Interpretation  Date/Time:  Monday Aug 03 2020 14:08:57 EDT Ventricular Rate:  53 PR Interval:  181 QRS Duration: 104 QT Interval:  459 QTC Calculation: 431 R Axis:   81 Text Interpretation: Sinus rhythm Borderline right axis deviation Left ventricular hypertrophy Baseline wander in lead(s) V3 similar to earlier in the day Confirmed by Sherwood Gambler [phone]) on 08/03/2020 2:22:43 PM   Radiology DG Chest 2 View  Result Date: 08/03/2020 CLINICAL DATA:  Chest discomfort and shortness of breath beginning 2 days ago. History of cardiac stents. EXAM: CHEST - 2 VIEW COMPARISON:  06/11/2019. FINDINGS: Cardiac silhouette is normal in size. No mediastinal or hilar masses or evidence of adenopathy. Lungs are clear.  No pleural effusion or pneumothorax. Skeletal structures are intact. IMPRESSION: No active cardiopulmonary disease. Electronically Signed   By: Lajean Manes M.D.   On: 08/03/2020 13:25    Procedures Procedures   Medications Ordered in ED Medications  nitroGLYCERIN (NITROSTAT) SL tablet 0.4 mg (has no administration in time range)  LORazepam (ATIVAN) injection 1 mg (1 mg Intravenous Given 08/03/20 1552)    ED Course  I have reviewed the triage vital signs and the nursing notes.  Pertinent labs & imaging results that were available during my care of the patient were reviewed by me and considered in my medical decision making (see chart for details).  Clinical Course as of 08/03/20 1554  Mon Aug 03, 2020  1404 Pulse Rate(!): 41 [AH]  1459 Increase to 60 of imdur   [AH]    Clinical Course User Index [AH] Margarita Mail, PA-C  MDM Rules/Calculators/A&P                          65 year old male with known extensive coronary artery disease.  He is here with increasing frequency of anginal symptoms also increasing intensity. The emergent differential diagnosis of chest pain includes: Acute coronary syndrome, pericarditis, aortic dissection, pulmonary embolism,  tension pneumothorax, pneumonia, and esophageal rupture. I ordered and reviewed work-up for ACS with patient's history.  BMP with slightly elevated potassium level likely some mild hemolysis and does not need any interventions.  CBC shows chronic polycythemia.  Initial troponin is within normal limits.  Patient's 2 view chest x-ray I reviewed and interpreted shows no acute abnormalities, EKG shows sinus bradycardia without any changes suggestive of acute ischemia.  I discussed the case with Dr. Marlou Porch by telephone.  Currently he suggests a second troponin and increase in his Imdur from 30 mg to 60 mg daily if there is no change in his troponin change in his clinical status.  I have reassessed the patient and discussed his current work-up and plan.  He is well-appearing without his symptoms of angina.  He understands that he will be discharged with medication changes and strict return precautions and close follow-up with cardiology showed his second troponin returned within normal limits.  I have given signout to [place] who will follow up on lab work.  Final Clinical Impression(s) / ED Diagnoses Final diagnoses:  Angina concurrent with and due to arteriosclerosis of coronary artery [facility])    Rx / DC Orders ED Discharge Orders    None       Margarita Mail, PA-C 08/03/20 1601    Sherwood Gambler, MD 08/08/20 [phone]

## 2020-08-03 NOTE — Discharge Instructions (Signed)
Please increase your IMDUR from  15 mg twice a day to 30mg  twice a day. Please call Dr. Evette Georges office to follow up ASAP!   Get help right away if: You have pain in your chest, neck, ear, arm, jaw, stomach, or back that: Lasts more than a few minutes. Is recurring. Is not relieved by taking medicine under your tongue (sublingual nitroglycerin). You have profuse sweating without cause. You have unexplained: Heartburn or indigestion. Shortness of breath or difficulty breathing. Fluttering or fast heartbeat (palpitations). Nausea or vomiting. Fatigue. Feelings of nervousness or anxiety. Weakness. Diarrhea. You have sudden light-headedness or dizziness. You faint. You feel like hurting yourself or think about taking your own life.

## 2020-08-03 NOTE — ED Notes (Signed)
Patient transported to X-ray 

## 2020-08-03 NOTE — ED Triage Notes (Signed)
Pt from home with complaints of chest discomfort and shob that started on Saturday. Pt took two nitro tabs on saturday and two nitro tabs on Sunday. Denies n/v VSS

## 2020-08-10 ENCOUNTER — Other Ambulatory Visit: Payer: Self-pay | Admitting: Family Medicine

## 2020-08-10 ENCOUNTER — Telehealth: Payer: Self-pay

## 2020-08-10 ENCOUNTER — Other Ambulatory Visit: Payer: Self-pay

## 2020-08-10 ENCOUNTER — Telehealth: Payer: Self-pay | Admitting: Cardiovascular Disease

## 2020-08-10 MED ORDER — METOPROLOL SUCCINATE ER 25 MG PO TB24: 25.0000 mg | ORAL_TABLET | Freq: Every day | ORAL | 3 refills | Status: DC

## 2020-08-10 NOTE — Telephone Encounter (Signed)
Pt calling states his pulse this morning has been 50 and BP 142/80s. Since ED visit last week pt has been experiencing more "anxiety attacks." Pt goes to cardiologist on Thursday 6/9. Pt denies chest pain/tightness. States he did try to nap after anxiety this morning but woke up to another attack. He since has tried walking outside to raise heart rate which does seem to help some. Pt states once he gets heart rate to 60 or above he feels mostly better but hard to maintain this. He does not have his klonopin at home with him, ED did not send medications home with home. ED gave ativan injection as they saw anxiety attack while he was there, advised him to fu w/ cardiologist.   Jacob Collier, [place] 08/10/20 9:22 AM

## 2020-08-10 NOTE — Telephone Encounter (Signed)
Discussed with Dr. [name] He agreed to decrease metoprolol succinate to 25mg  QD until 6/9 appointment No refills of clonazepam given No other changes Keep f/u as planned  Spoke with patient and relayed info. He voiced understanding. He is in contact with PCP office regarding clonazepam refill but unsure if he can get this as his MD is [facility]OO this week.

## 2020-08-10 NOTE — Telephone Encounter (Signed)
Spoke with patient of Dr. [name] - STAT call from operator   Patient went to ED last week >> EDP advised to take imdur 30mg  BID  He reports a "panic attack" this morning - felt like he couldn't breathe, HR was 50, BP was 144/82. He feels "like gravity on this planet has doubled this morning". He was walked to try and get his HR up, but HR only gets to low-mid 60s. He reports NO chest pain/angina. He reports only shortness of breath, weighted down feeling.   Patient reports his symptoms settled down in ED last week, after IV antianxiety med  He called PCP office for a refill of clonazepam - but he is out of the office. Advised that we generally do not fill this.   Reviewed meds w/patient. List up to date. Explained that metoprolol succinate will impact his HR - this med dose was increased at last visit (explained would be OK to decrease to 1 tablet daily until he sees MD on 6/9)  Advised patient I will discuss his concerns with MD and call him back

## 2020-08-10 NOTE — Telephone Encounter (Signed)
Pt c/o medication issue: 1. Name of Medication: Isosorbide  2. How are you currently taking this medication (dosage and times per day)? Twice a day 3. Are you having a reaction (difficulty breathing--STAT)?  Yes  4. What is your medication issue? They upped his medication

## 2020-08-10 NOTE — Telephone Encounter (Signed)
Pt returned call. Pt states he does have his klonopin at work which is 9 miles from him. Denies availability for wife to get meds from work. Spoke w/ Dr. Tobie Poet who states pt will need to get current script from work due to controlled substance. Pt VU.   Royce Macadamia, [place] 08/10/20 2:20 PM

## 2020-08-11 ENCOUNTER — Other Ambulatory Visit: Payer: Self-pay

## 2020-08-11 ENCOUNTER — Ambulatory Visit (INDEPENDENT_AMBULATORY_CARE_PROVIDER_SITE_OTHER): Payer: Managed Care, Other (non HMO) | Admitting: Family Medicine

## 2020-08-11 VITALS — BP 126/72 | HR 58 | Temp 98.0°F | Resp 16 | Wt 211.4 lb

## 2020-08-11 DIAGNOSIS — R0602 Shortness of breath: Secondary | ICD-10-CM

## 2020-08-11 DIAGNOSIS — F419 Anxiety disorder, unspecified: Secondary | ICD-10-CM

## 2020-08-11 DIAGNOSIS — I25110 Atherosclerotic heart disease of native coronary artery with unstable angina pectoris: Secondary | ICD-10-CM

## 2020-08-11 DIAGNOSIS — K5909 Other constipation: Secondary | ICD-10-CM | POA: Diagnosis not present

## 2020-08-11 MED ORDER — LORAZEPAM 0.5 MG PO TABS
0.5000 mg | ORAL_TABLET | Freq: Two times a day (BID) | ORAL | 0 refills | Status: DC | PRN
Start: 2020-08-11 — End: 2020-09-09

## 2020-08-11 NOTE — Patient Instructions (Signed)
Constipation: Recommend magnesium citrate 1/2 bottle now and repeat in 4 hours if no BM.  Hold metoprolol.

## 2020-08-11 NOTE — Progress Notes (Signed)
Acute Office Visit  Subjective:    Patient ID: Jacob Collier, male    DOB: 04/03/1955, 65 y.o.   MRN: [id number]  Chief Complaint  Patient presents with   Anxiety    HPI Patient was seen on May 30th in the ED at [facility].  The patient had chest pain over the weekend and had taken a total of 4 nitroglycerin 2 on Saturday and 2 on Sunday.)  Helped some but did not fully resolve his discomfort.  The discomfort was in his chest and radiated to his left arm. In ED they increased metoprolol to 50 mg, but had bradycardia and low bp. Dr. Evette Georges nurse recommended to decrease metoprolol to 25 mg once daily and increased Imdur 30 mg 2 daily. He was not able to tolerated this because his bp drops too low. He is currently taking imdur 15 mg one twice a day.   Bowel resection in 2018 secondary to radiation for prostate cancer in 2017.  Recent colonoscopy was done recently was normal.  The feeling he is having today is exactly what he felt before the bowel obstruction in 2018. Had been constipated over the last couple of days and finally had a bm last week. He used a suppository and enema which relieved his breathing. Awoke this morning with similar symptoms. Has one area of abdominal pain which resolved last night with bm, but than has returned today. Has taken another suppository and enema, but no bm yet.  Patient has been out of clonazepam since last Friday because pt left it at work 5 days ago. His work is 50 miles away. He is not comfortable driving that far, nor is he comfortable   Past Medical History:  Diagnosis Date   Arthritis    Coronary artery disease    2 stents   Diverticulosis    Hard of hearing    Hypertension    hx of elevation on lyrica, off med and now normal    Wears glasses     Past Surgical History:  Procedure Laterality Date   CARDIAC CATHETERIZATION     with 2 stents    COLECTOMY  05/2018   EYE SURGERY  02/2020   bilaterl cataracts   PROSTATECTOMY      TOTAL HIP ARTHROPLASTY Left 11/10/2015   Procedure: LEFT TOTAL HIP ARTHROPLASTY ANTERIOR APPROACH;  Surgeon: Mcarthur Rossetti, MD;  Location: [place];  Service: Orthopedics;  Laterality: Left;    Family History  Problem Relation Age of Onset   Alzheimer's disease Mother    Stroke Father    Diabetes Sister    Heart attack Maternal Grandmother    Cancer Maternal Uncle        prostate   Prostate cancer Maternal Uncle    Cancer Maternal Uncle        prostate   Prostate cancer Maternal Uncle    Colon polyps Brother    Colon cancer Neg Hx    Esophageal cancer Neg Hx    Rectal cancer Neg Hx    Stomach cancer Neg Hx     Social History   Socioeconomic History   Marital status: Married    Spouse name: Not on file   Number of children: 0   Years of education: Not on file   Highest education level: Not on file  Occupational History   Occupation: Employed at [employer]  Tobacco Use   Smoking status: Never   Smokeless tobacco: Never  Vaping Use  Vaping Use: Former  Substance and Sexual Activity   Alcohol use: Not Currently    Alcohol/week: 0.0 standard drinks    Comment: rarely   Drug use: No   Sexual activity: Yes    Partners: Female    Birth control/protection: None  Other Topics Concern   Not on file  Social History Narrative   Not on file   Social Determinants of Health   Financial Resource Strain: Not on file  Food Insecurity: Not on file  Transportation Needs: Not on file  Physical Activity: Not on file  Stress: Not on file  Social Connections: Not on file  Intimate Partner Violence: Not on file    Outpatient Medications Prior to Visit  Medication Sig Dispense Refill   amLODipine (NORVASC) 2.5 MG tablet TAKE 1 TABLET BY MOUTH EVERY DAY (Patient taking differently: Take 2 2.5 mg for 5 mg total.) 90 tablet 3   aspirin 81 MG EC tablet Take 81 mg by mouth daily.      clonazePAM (KLONOPIN) 0.5 MG tablet TAKE 1 TABLET (0.5 MG TOTAL) BY MOUTH 3 (THREE) TIMES  DAILY AS NEEDED FOR ANXIETY. 90 tablet 3   clopidogrel (PLAVIX) 75 MG tablet Take 1 tablet (75 mg total) by mouth daily. 90 tablet 3   Eszopiclone 3 MG TABS Take 1 tablet (3 mg total) by mouth at bedtime. 30 tablet 3   ezetimibe (ZETIA) 10 MG tablet TAKE 1 TABLET BY MOUTH EVERY DAY 90 tablet 3   isosorbide mononitrate (IMDUR) 30 MG 24 hr tablet Take 30 mg in the morning, and 15 mg in the evening. (Patient taking differently: Take 64m in the morning, and 15 mg in the evening.) 180 tablet 1   losartan (COZAAR) 25 MG tablet TAKE 1 TABLET BY MOUTH EVERY DAY (Patient taking differently: Takes 1/2 tablet) 90 tablet 3   methylPREDNISolone (MEDROL) 4 MG tablet Take as directed 21 tablet 0   nitroGLYCERIN (NITROSTAT) 0.4 MG SL tablet Place 1 tablet (0.4 mg total) under the tongue every 5 (five) minutes as needed for chest pain. 25 tablet 2   Omega-3 Fatty Acids (FISH OIL) 1000 MG CAPS Take 1,000 mg by mouth daily.      celecoxib (CELEBREX) 200 MG capsule Take 1 capsule (200 mg total) by mouth 2 (two) times daily. (Patient not taking: Reported on 07/01/2020) 60 capsule 3   LIVALO 1 MG TABS TAKE 1 TABLET BY MOUTH EVERY DAY 90 tablet 2   metoprolol succinate (TOPROL XL) 25 MG 24 hr tablet Take 1 tablet (25 mg total) by mouth daily. (Patient taking differently: Take 25 mg by mouth daily. Takes 1/2 tablet at night) 90 tablet 3   No facility-administered medications prior to visit.    Allergies  Allergen Reactions   Ranolazine     Other reaction(s): Other (See Comments) Chest discomfort/Reflux   Statins Other (See Comments)    myalgias    Review of Systems  Constitutional:  Negative for chills, fatigue and fever.  HENT:  Negative for congestion, ear pain and sore throat.   Respiratory:  Positive for shortness of breath. Negative for cough.   Cardiovascular:  Negative for chest pain.  Gastrointestinal:  Positive for constipation. Negative for abdominal pain, diarrhea, nausea and vomiting.  Endocrine:  Negative for polydipsia, polyphagia and polyuria.  Genitourinary:  Negative for dysuria and frequency.  Musculoskeletal:  Negative for arthralgias and myalgias.  Neurological:  Negative for dizziness and headaches.  Psychiatric/Behavioral:  Negative for dysphoric mood.  No dysphoria      Objective:    Physical Exam Vitals reviewed.  Constitutional:      Appearance: Normal appearance.  Cardiovascular:     Rate and Rhythm: Normal rate and regular rhythm.     Heart sounds: Normal heart sounds.  Pulmonary:     Effort: Pulmonary effort is normal.     Breath sounds: Normal breath sounds. No wheezing, rhonchi or rales.  Abdominal:     General: Bowel sounds are normal.     Palpations: Abdomen is soft.     Tenderness: There is no abdominal tenderness.  Neurological:     Mental Status: He is alert.  Psychiatric:        Behavior: Behavior normal.     Comments: anxious    BP 126/72   Pulse (!) 58   Temp 98 F (36.7 C)   Resp 16   Wt 211 lb 6.4 oz (95.9 kg)   BMI 29.48 kg/m  Wt Readings from Last 3 Encounters:  08/13/20 215 lb 12.8 oz (97.9 kg)  08/11/20 211 lb 6.4 oz (95.9 kg)  08/03/20 215 lb (97.5 kg)    Health Maintenance Due  Topic Date Due   Hepatitis C Screening  Never done   TETANUS/TDAP  Never done   Zoster Vaccines- Shingrix (1 of 2) Never done   PNA vac Low Risk Adult (1 of 2 - PCV13) Never done   COVID-19 Vaccine (2 - Janssen risk series) 08/07/2019    There are no preventive care reminders to display for this patient.   No results found for: TSH Lab Results  Component Value Date   WBC 8.8 08/03/2020   HGB 19.3 (H) 08/03/2020   HCT 57.5 (H) 08/03/2020   MCV 88.7 08/03/2020   PLT 187 08/03/2020   Lab Results  Component Value Date   NA 134 (L) 08/03/2020   K 5.2 (H) 08/03/2020   CO2 22 08/03/2020   GLUCOSE 94 08/03/2020   BUN 14 08/03/2020   CREATININE 1.11 08/03/2020   BILITOT 0.3 06/22/2020   ALKPHOS 68 06/22/2020   AST 18 06/22/2020    ALT 30 06/22/2020   PROT 6.4 06/22/2020   ALBUMIN 4.2 06/22/2020   CALCIUM 9.1 08/03/2020   ANIONGAP 7 08/03/2020   EGFR 97 06/22/2020   Lab Results  Component Value Date   CHOL 108 09/13/2019   Lab Results  Component Value Date   HDL 28 (L) 09/13/2019   Lab Results  Component Value Date   LDLCALC 58 09/13/2019   Lab Results  Component Value Date   TRIG 119 09/13/2019   Lab Results  Component Value Date   CHOLHDL 3.9 09/13/2019   Lab Results  Component Value Date   HGBA1C 5.8 (H) 11/28/2019       Assessment & Plan:  1. Shortness of breath - EKG 12-Lead Bradycardia rate 52..no st changes Hold metoprolol.  2. Coronary artery disease involving native coronary artery of native heart with unstable angina pectoris ([place]) Keep appt with cardiology in 2 days  3. Anxiety  I gave him enough lorazepam to bridge him til he can fill his clonazepam on Friday.   4. Constipation: Magnesium citrate 1/2 bottle now and repeat in 4 hours if no bm.   Meds ordered this encounter  Medications   LORazepam (ATIVAN) 0.5 MG tablet    Sig: Take 1 tablet (0.5 mg total) by mouth 2 (two) times daily as needed for anxiety.    Dispense:  8 tablet  Refill:  0    Pt may have clonazepam on 6/9    Orders Placed This Encounter  Procedures   EKG 12-Lead    Follow-up: No follow-ups on file.  An After Visit Summary was printed and given to the patient.  Rochel Brome, MD [facility] [phone]

## 2020-08-13 ENCOUNTER — Ambulatory Visit (INDEPENDENT_AMBULATORY_CARE_PROVIDER_SITE_OTHER): Payer: Managed Care, Other (non HMO) | Admitting: Cardiovascular Disease

## 2020-08-13 ENCOUNTER — Other Ambulatory Visit: Payer: Self-pay

## 2020-08-13 ENCOUNTER — Encounter: Payer: Self-pay | Admitting: Cardiovascular Disease

## 2020-08-13 VITALS — BP 136/84 | HR 65 | Ht 71.0 in | Wt 215.8 lb

## 2020-08-13 DIAGNOSIS — E785 Hyperlipidemia, unspecified: Secondary | ICD-10-CM

## 2020-08-13 DIAGNOSIS — F419 Anxiety disorder, unspecified: Secondary | ICD-10-CM

## 2020-08-13 DIAGNOSIS — R0600 Dyspnea, unspecified: Secondary | ICD-10-CM | POA: Diagnosis not present

## 2020-08-13 DIAGNOSIS — Z8546 Personal history of malignant neoplasm of prostate: Secondary | ICD-10-CM

## 2020-08-13 DIAGNOSIS — I25119 Atherosclerotic heart disease of native coronary artery with unspecified angina pectoris: Secondary | ICD-10-CM

## 2020-08-13 DIAGNOSIS — I10 Essential (primary) hypertension: Secondary | ICD-10-CM

## 2020-08-13 DIAGNOSIS — D751 Secondary polycythemia: Secondary | ICD-10-CM

## 2020-08-13 DIAGNOSIS — R0609 Other forms of dyspnea: Secondary | ICD-10-CM

## 2020-08-13 MED ORDER — METOPROLOL SUCCINATE ER 25 MG PO TB24: ORAL_TABLET | ORAL | 3 refills | Status: DC

## 2020-08-13 MED ORDER — PRAVASTATIN SODIUM 20 MG PO TABS: 20.0000 mg | ORAL_TABLET | Freq: Every evening | ORAL | 0 refills | Status: DC

## 2020-08-13 NOTE — Progress Notes (Signed)
Cardiology Office Note    Date:  08/15/2020   ID:  Jacob Collier, Nephew 1955-04-27, MRN [id number]  PCP:  [name], MD  Cardiologist:  [name], MD   No chief complaint on file.  2 month F/U   History of Present Illness:  Jacob Collier is a 65 y.o. male who is a former patient of Dr. Debara Pickett.  He last saw Dr. Debara Pickett in 2018.  He has known CAD since 2018 has been followed by cardiology in [facility].  He is status post PCI to the proximal to mid RCA at catheterization on January 04, 2019 and had known CTO of his circumflex.  He had undergone repeat catheterization in March 2021 showing in-stent restenosis of his RCA stent which progressed to chronic total occlusion as well as chronic total occlusion of his circumflex.  His LAD was patent.  He had been seen by his cardiologist when he presented to [facility] on June 07, 2019 requesting a second opinion.  At that time I saw him in the hospital and ultimately was able to obtain the angiographic studies that were done at [facility] for his most recent catheterization of May 25, 2019.  When that time I reviewed the angiographic films with Dr. Martinique who did not feel his CTO of his RCA was amenable to intervention due to lack of any beak large occlusion at the site of a small branch.  Complex was not amenable to intervention.  He did have some concomitant CAD.  After much discussion I reviewed the data with the patient and felt that medical therapy was the best option.  There was some concern in the past and he did not benefit from initial dosing of ranolazine.  He was discharged the following day and increased medical regimen consisting of isosorbide 30 mg, amlodipine, aspirin and Plavix in addition to his previous lisinopril.  He was bradycardic in the 50s.  He also was treated with high potency statin therapy and atorvastatin was increased to 80 mg.  He presented to the emergency room on June 11, 2019 with palpitations  and neck tightness.  EMS reported a narrow complex tachycardia at a rate of 160 bpm.  Upon arrival to the emergency room he was in sinus rhythm with PVCs and bigeminy.  He was suspected to have had very brief episode of SVT and the plan was to start Toprol-XL 25 mg daily.  I saw him after his ER evaluation in June 25, 2019.  He had  decided to continue his cardiology care with me since his hospitalization.  He has felt improved and denies any significant increasing chest pain symptoms or recurrent episodes of SVT.[name]  He has noticed development of myalgias since his increased atorvastatin dose.  He also has noticed a mild cough on lisinopril.  During that evaluation, in light of his cough sensitization I recommended switching to losartan who started him on 25 mg daily.  Due to issues with atorvastatin causing myalgias I recommended he reduce his dose to 20 mg and also recommended initiation of Zetia 10 mg.  We discussed if he continued to have increasing anginal symptomatology.  Repeat challenge with Ranexa could be undertaken.  He was subsequently evaluated by Sande Rives, PA-C in June 2021.  He had a cardiac monitor which showed predominant sinus rhythm at 65 bpm.  The slowest heart rate was sinus bradycardia at 42 bpm the fastest heart rate was sinus tachycardia 164 bpm.  He  had occasional PVCs with a ventricular couplet and several episodes of short-lived ventricular bigeminal rhythm.  There was a 4 beat episode of nonsustained VT, 92 bpm.  He also had another episode of SVT lasting 36 seconds.  I saw him in September 2021.  Since he has been on a beta-blocker therapy, he denied any recurrent tachycardia dysrhythmias.  He was on a regimen consisting of amlodipine 2.5 mg, isosorbide 30 mg but he is taking this 50 mg twice a day, losartan 25 mg, Toprol-XL 25 mg daily.  He continues to be on DAPT with aspirin/Plavix.  He is tolerating atorvastatin 20 mg and Zetia 10 mg for his hyperlipidemia.  He has  noticed some episodes of mild chest discomfort.  He denied recurrent SVT and is asked he feels this was anxiety mediated and he had been placed on low-dose Klonopin by his primary provider.   I saw him on April 01, 2020.  His palpitations are less since he has been taking Klonopin.  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  He has had urinary issues and apparently has dark urine.  He is being evaluated by Dr. Vikki Ports of urology and may require bladder sphincter surgery.  He has undergone urinalysis.  He was told perhaps that some of his dark urine may read present a metabolic issue.  He has a history of prostatectomy and radiation treatment.  He admits to significant myalgias and has been on atorvastatin 20 mg and Zetia 10 mg for hyperlipidemia.  During that evaluation, I recommended he discontinue atorvastatin and try rosuvastatin 10 mg every other day along with Zetia and attempt to achieve a target LDL goal less than 70.  He was on aspirin/Plavix for DAPT and amlodipine 5 mg, isosorbide 15 mg twice a day, metoprolol succinate 25 mg and losartan 12.5 mg for hypertension/CAD.  He denied any recent anginal symptoms.    When I saw him on June 12, 2020  he had discontinued rosuvastatin.  He works all day standing on hard concrete and he felt that both atorvastatin and rosuvastatin were contributing to some myalgias.  ESR on April 01, 2020 was normal at 9. He denied any anginal symptoms.  At times he does note some mild shortness of breath with activity.  He was unaware of palpitations.  During that evaluation, I recommended a trial of Livalo which oftentimes can be tolerated in patients with otherwise statin intolerance.  With his known CAD with CTO's of his circumflex and RCA I recommended slight titration of isosorbide recommended he take 30 mg in the morning and 60 mg at night and slightly titrated metoprolol to 37.5 mg twice a day.  I recommended he undergo a follow-up echo Doppler  evaluation.  He underwent a follow-up echo Doppler study on Jul 22, 2020 which showed an EF of 50 to 16%, grade 2 diastolic dysfunction and mild eccentric LVH of the septum.  There was hypokinesis involving the anterolateral posterior and basal inferior wall.  There was mild ascending aortic dilatation at 41 mm mild left atrial dilatation.  He states that he developed an episode of angina on Aug 01, 2019 which led to ER evaluation on May 30.  Troponins were negative.  He has had issues with increased anxiety and panic attacks and had called his primary physician since he felt he needed a new prescription for his clonazepam.  He states he did not tolerate Livalo.  However his brother also had intolerance to many statins has been  able to tolerate pravastatin 80 and he suggested perhaps a trial of this may be beneficial.  Of note, recent laboratory from his evaluation revealed progressive increase in hemoglobin now at 19.3 with hematocrit of 57.3.  Platelets 197,000.  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  He denies recurrent chest pain since his angina at the end of May.  He presents for reevaluation.   Past Medical History:  Diagnosis Date   Arthritis    Coronary artery disease    2 stents   Diverticulosis    Hard of hearing    Hypertension    hx of elevation on lyrica, off med and now normal    Wears glasses     Past Surgical History:  Procedure Laterality Date   CARDIAC CATHETERIZATION     with 2 stents    COLECTOMY  05/2018   EYE SURGERY  02/2020   bilaterl cataracts   PROSTATECTOMY     TOTAL HIP ARTHROPLASTY Left 11/10/2015   Procedure: LEFT TOTAL HIP ARTHROPLASTY ANTERIOR APPROACH;  Surgeon: Mcarthur Rossetti, MD;  Location: [place];  Service: Orthopedics;  Laterality: Left;    Current Medications: Outpatient Medications Prior to Visit  Medication Sig  Dispense Refill   amLODipine (NORVASC) 2.5 MG tablet TAKE 1 TABLET BY MOUTH EVERY DAY (Patient taking differently: Take 2 2.5 mg for 5 mg total.) 90 tablet 3   aspirin 81 MG EC tablet Take 81 mg by mouth daily.      clonazePAM (KLONOPIN) 0.5 MG tablet TAKE 1 TABLET (0.5 MG TOTAL) BY MOUTH 3 (THREE) TIMES DAILY AS NEEDED FOR ANXIETY. 90 tablet 3   clopidogrel (PLAVIX) 75 MG tablet Take 1 tablet (75 mg total) by mouth daily. 90 tablet 3   Eszopiclone 3 MG TABS Take 1 tablet (3 mg total) by mouth at bedtime. 30 tablet 3   ezetimibe (ZETIA) 10 MG tablet TAKE 1 TABLET BY MOUTH EVERY DAY 90 tablet 3   isosorbide mononitrate (IMDUR) 30 MG 24 hr tablet Take 30 mg in the morning, and 15 mg in the evening. (Patient taking differently: Take 44m in the morning, and 15 mg in the evening.) 180 tablet 1   LORazepam (ATIVAN) 0.5 MG tablet Take 1 tablet (0.5 mg total) by mouth 2 (two) times daily as needed for anxiety. 8 tablet 0   losartan (COZAAR) 25 MG tablet TAKE 1 TABLET BY MOUTH EVERY DAY (Patient taking differently: Takes 1/2 tablet) 90 tablet 3   methylPREDNISolone (MEDROL) 4 MG tablet Take as directed 21 tablet 0   nitroGLYCERIN (NITROSTAT) 0.4 MG SL tablet Place 1 tablet (0.4 mg total) under the tongue every 5 (five) minutes as needed for chest pain. 25 tablet 2   Omega-3 Fatty Acids (FISH OIL) 1000 MG CAPS Take 1,000 mg by mouth daily.      metoprolol succinate (TOPROL XL) 25 MG 24 hr tablet Take 1 tablet (25 mg total) by mouth daily. (Patient taking differently: Take 25 mg by mouth daily. Takes 1/2 tablet at night) 90 tablet 3   LIVALO 1 MG TABS TAKE 1 TABLET BY MOUTH EVERY DAY 90 tablet 2   No facility-administered medications prior to visit.     Allergies:   Ranolazine and Statins   Social History   Socioeconomic History   Marital status: Married    Spouse name: Not on file   Number  of children: 0   Years of education: Not on file   Highest education level: Not on file  Occupational  History   Occupation: Employed at [employer]  Tobacco Use   Smoking status: Never   Smokeless tobacco: Never  Vaping Use   Vaping Use: Former  Substance and Sexual Activity   Alcohol use: Not Currently    Alcohol/week: 0.0 standard drinks    Comment: rarely   Drug use: No   Sexual activity: Yes    Partners: Female    Birth control/protection: None  Other Topics Concern   Not on file  Social History Narrative   Not on file   Social Determinants of Health   Financial Resource Strain: Not on file  Food Insecurity: Not on file  Transportation Needs: Not on file  Physical Activity: Not on file  Stress: Not on file  Social Connections: Not on file     Family History:  The patient's family history includes Alzheimer's disease in his mother; Cancer in his maternal uncle and maternal uncle; Colon polyps in his brother; Diabetes in his sister; Heart attack in his maternal grandmother; Prostate cancer in his maternal uncle and maternal uncle; Stroke in his father.   ROS General: Negative; No fevers, chills, or night sweats;  HEENT: Negative; No changes in vision or hearing, sinus congestion, difficulty swallowing Pulmonary: Negative; No cough, wheezing, shortness of breath, hemoptysis Cardiovascular: See HPI GI: Negative; No nausea, vomiting, diarrhea, or abdominal pain GU: h/o prostatectomy, radiation treatment.  Need for bladder sphincter surgery.  Complains of dark urine. Musculoskeletal: Myalgias Hematologic/Oncology: Negative; no easy bruising, bleeding Endocrine: Negative; no heat/cold intolerance; no diabetes Neuro: Negative; no changes in balance, headaches Skin: Negative; No rashes or skin lesions Psychiatric: History of anxiety and panic attacks Sleep: Negative; No snoring, daytime sleepiness, hypersomnolence, bruxism, restless legs, hypnogognic hallucinations, no cataplexy Other comprehensive 14 point system review is negative.   PHYSICAL EXAM:   VS:  BP 136/84 (BP  Location: Left Arm, Patient Position: Sitting, Cuff Size: Normal)   Pulse 65   Ht 5' 11"  (1.803 m)   Wt 215 lb 12.8 oz (97.9 kg)   BMI 30.10 kg/m     Repeat blood pressure by me was 120/72 supine 120/70 standing  Wt Readings from Last 3 Encounters:  08/13/20 215 lb 12.8 oz (97.9 kg)  08/11/20 211 lb 6.4 oz (95.9 kg)  08/03/20 215 lb (97.5 kg)     General: Alert, oriented, no distress.  Skin: normal turgor, no rashes, warm and dry HEENT: Normocephalic, atraumatic. Pupils equal round and reactive to light; sclera anicteric; extraocular muscles intact;  Nose without nasal septal hypertrophy Mouth/Parynx benign; Mallinpatti scale 3 Neck: No JVD, no carotid bruits; normal carotid upstroke Lungs: clear to ausculatation and percussion; no wheezing or rales Chest wall: without tenderness to palpitation Heart: PMI not displaced, RRR, s1 s2 normal, 1/6 systolic murmur, no diastolic murmur, no rubs, gallops, thrills, or heaves Abdomen: soft, nontender; no hepatosplenomehaly, BS+; abdominal aorta nontender and not dilated by palpation. Back: no CVA tenderness Pulses 2+ Musculoskeletal: full range of motion, normal strength, no joint deformities Extremities: no clubbing cyanosis or edema, Homan's sign negative  Neurologic: grossly nonfocal; Cranial nerves grossly wnl Psychologic: Normal mood and affect     Studies/Labs Reviewed:   EKG:  EKG is ordered today.  ECG (independently read by me): sinus rhythm at 65; PAC, QTc 480 msec  June 12, 2020 ECG (independently read by me): NSR at 62; NSST changes  January 2022 ECG (independently read by me): NSR at 62; LVH, small inferior Q waves  September 2021 ECG (independently read by me):Normal sinus rhythm at 61 bpm.  Normal intervals.  No ectopy.  April 2021 ECG (independently read by me): Sinus bradycardia 54 bpm.  Small Q-wave in lead III.  No ectopy.  Nonspecific ST changes.  Recent Labs: BMP Latest Ref Rng & Units 08/03/2020 06/22/2020  04/01/2020  Glucose 70 - 99 mg/dL 94 101(H) 80  BUN 8 - 23 mg/dL 14 12 12   Creatinine 0.61 - 1.24 mg/dL 1.11 0.83 1.19  BUN/Creat Ratio 10 - 24 - 14 10  Sodium 135 - 145 mmol/L 134(L) 138 140  Potassium 3.5 - 5.1 mmol/L 5.2(H) 4.4 5.1  Chloride 98 - 111 mmol/L 105 106 104  CO2 22 - 32 mmol/L 22 17(L) 22  Calcium 8.9 - 10.3 mg/dL 9.1 9.5 9.4     Hepatic Function Latest Ref Rng & Units 06/22/2020 04/01/2020 09/13/2019  Total Protein 6.0 - 8.5 g/dL 6.4 7.1 7.2  Albumin 3.8 - 4.8 g/dL 4.2 4.6 4.4  AST 0 - 40 IU/L 18 34 27  ALT 0 - 44 IU/L 30 50(H) 35  Alk Phosphatase 44 - 121 IU/L 68 82 72  Total Bilirubin 0.0 - 1.2 mg/dL 0.3 0.5 0.5  Bilirubin, Direct 0.0 - 0.2 mg/dL - - -    CBC Latest Ref Rng & Units 08/03/2020 06/22/2020 05/18/2020  WBC 4.0 - 10.5 K/uL 8.8 7.0 10.0  Hemoglobin 13.0 - 17.0 g/dL 19.3(H) 18.0(H) 18.4 Repeated and verified X2.(HH)  Hematocrit 39.0 - 52.0 % 57.5(H) 53.4(H) 54.5(H)  Platelets 150 - 400 K/uL 187 245 168.0   Lab Results  Component Value Date   MCV 88.7 08/03/2020   MCV 88 06/22/2020   MCV 87.4 05/18/2020   No results found for: TSH Lab Results  Component Value Date   HGBA1C 5.8 (H) 11/28/2019     BNP    Component Value Date/Time   BNP 67.2 06/05/2019 2231    ProBNP No results found for: PROBNP   Lipid Panel     Component Value Date/Time   CHOL 108 09/13/2019 1123   TRIG 119 09/13/2019 1123   HDL 28 (L) 09/13/2019 1123   CHOLHDL 3.9 09/13/2019 1123   CHOLHDL 4.8 06/06/2019 0612   VLDL 29 06/06/2019 0612   LDLCALC 58 09/13/2019 1123   LABVLDL 22 09/13/2019 1123     RADIOLOGY: DG Chest 2 View  Result Date: 08/03/2020 CLINICAL DATA:  Chest discomfort and shortness of breath beginning 2 days ago. History of cardiac stents. EXAM: CHEST - 2 VIEW COMPARISON:  06/11/2019. FINDINGS: Cardiac silhouette is normal in size. No mediastinal or hilar masses or evidence of adenopathy. Lungs are clear.  No pleural effusion or pneumothorax. Skeletal  structures are intact. IMPRESSION: No active cardiopulmonary disease. Electronically Signed   By: Lajean Manes M.D.   On: 08/03/2020 13:25   ECHOCARDIOGRAM COMPLETE  Result Date: 07/22/2020    ECHOCARDIOGRAM REPORT   Patient Name:   Jacob Collier Date of Exam: 07/22/2020 Medical Rec #:  [id number]        Height:       71.0 in Accession #:    [id number]       Weight:       220.0 lb Date of Birth:  1955-07-04       BSA:          2.196 m Patient Age:    59 years  BP:           126/68 mmHg Patient Gender: M                HR:           53 bpm. Exam Location:  [place] Procedure: 2D Echo, Cardiac Doppler and Color Doppler Indications:    I10 Hypertension  History:        Patient has prior history of Echocardiogram examinations, most                 recent 07/08/2016. Risk Factors:Hypertension and Former Smoker.  Sonographer:    Coralyn Helling RDCS Referring Phys: [place]  1. Left ventricular ejection fraction, by estimation, is 50 to 55%. The left ventricle has low normal function. The left ventricle demonstrates regional wall motion abnormalities (see scoring diagram/findings for description). There is mild eccentric left ventricular hypertrophy of the septal segment. Left ventricular diastolic parameters are consistent with Grade II diastolic dysfunction (pseudonormalization).  2. Right ventricular systolic function is normal. The right ventricular size is mildly enlarged. There is normal pulmonary artery systolic pressure. The estimated right ventricular systolic pressure is 16.1 mmHg.  3. Left atrial size was mildly dilated.  4. The mitral valve is normal in structure. Mild mitral valve regurgitation. No evidence of mitral stenosis.  5. The aortic valve is grossly normal. There is mild calcification of the aortic valve. Aortic valve regurgitation is not visualized. No aortic stenosis is present.  6. Aortic dilatation noted. There is mild dilatation of the ascending aorta,  measuring 41 mm.  7. The inferior vena cava is normal in size with greater than 50% respiratory variability, suggesting right atrial pressure of 3 mmHg. FINDINGS  Left Ventricle: Left ventricular ejection fraction, by estimation, is 50 to 55%. The left ventricle has low normal function. The left ventricle demonstrates regional wall motion abnormalities. The left ventricular internal cavity size was normal in size. There is mild eccentric left ventricular hypertrophy of the septal segment. Left ventricular diastolic parameters are consistent with Grade II diastolic dysfunction (pseudonormalization).  LV Wall Scoring: The antero-lateral wall, posterior wall, and basal inferior segment are hypokinetic. Right Ventricle: The right ventricular size is mildly enlarged. No increase in right ventricular wall thickness. Right ventricular systolic function is normal. There is normal pulmonary artery systolic pressure. The tricuspid regurgitant velocity is 2.46  m/s, and with an assumed right atrial pressure of 3 mmHg, the estimated right ventricular systolic pressure is 09.6 mmHg. Left Atrium: Left atrial size was mildly dilated. Right Atrium: Right atrial size was normal in size. Pericardium: There is no evidence of pericardial effusion. Mitral Valve: The mitral valve is normal in structure. Mild mitral valve regurgitation. No evidence of mitral valve stenosis. Tricuspid Valve: The tricuspid valve is normal in structure. Tricuspid valve regurgitation is trivial. No evidence of tricuspid stenosis. Aortic Valve: The aortic valve is grossly normal. There is mild calcification of the aortic valve. Aortic valve regurgitation is not visualized. No aortic stenosis is present. Pulmonic Valve: The pulmonic valve was not well visualized. Pulmonic valve regurgitation is trivial. No evidence of pulmonic stenosis. Aorta: Aortic dilatation noted. There is mild dilatation of the ascending aorta, measuring 41 mm. Venous: The inferior vena  cava is normal in size with greater than 50% respiratory variability, suggesting right atrial pressure of 3 mmHg. IAS/Shunts: The interatrial septum appears to be lipomatous. No atrial level shunt detected by color flow Doppler.  LEFT VENTRICLE PLAX 2D LVIDd:  5.50 cm  Diastology LVIDs:         4.00 cm  LV e' medial:    4.64 cm/s LV PW:         1.00 cm  LV E/e' medial:  14.0 LV IVS:        1.30 cm  LV e' lateral:   10.97 cm/s LVOT diam:     2.20 cm  LV E/e' lateral: 5.9 LV SV:         91 LV SV Index:   42 LVOT Area:     3.80 cm  RIGHT VENTRICLE             IVC RV S prime:     13.40 cm/s  IVC diam: 1.20 cm TAPSE (M-mode): 2.2 cm RVSP:           27.2 mmHg LEFT ATRIUM           Index       RIGHT ATRIUM           Index LA diam:      4.40 cm 2.00 cm/m  RA Pressure: 3.00 mmHg LA Vol (A2C): 82.9 ml 37.76 ml/m RA Area:     17.70 cm LA Vol (A4C): 76.9 ml 35.02 ml/m RA Volume:   51.50 ml  23.45 ml/m  AORTIC VALVE LVOT Vmax:   112.00 cm/s LVOT Vmean:  73.200 cm/s LVOT VTI:    0.240 m  AORTA Ao Root diam: 3.90 cm Ao Asc diam:  4.10 cm MV E velocity: 64.92 cm/s  TRICUSPID VALVE MV A velocity: 43.96 cm/s  TR Peak grad:   24.2 mmHg MV E/A ratio:  1.48        TR Vmax:        246.00 cm/s                            Estimated RAP:  3.00 mmHg                            RVSP:           27.2 mmHg                             SHUNTS                            Systemic VTI:  0.24 m                            Systemic Diam: 2.20 cm Cherlynn Kaiser MD Electronically signed by Cherlynn Kaiser MD Signature Date/Time: 07/22/2020/10:20:10 AM    Final      Additional studies/ records that were reviewed today include:  I reviewed the patient's most recent hospitalization, and while in the hospital was able to review his outside angiographic films.    ASSESSMENT:    1. Coronary artery disease involving native coronary artery of native heart with angina pectoris ([place])   2. Exertional dyspnea   3. Hyperlipidemia with target LDL  less than 70   4. Essential hypertension   5. Erythrocytosis   6. History of prostate cancer   7. Anxiety     PLAN:  Ronte Parker is a 65 year old gentleman who has known CAD and underwent initial PCI to his RCA in  October 2020 at which time he also had known CTO of his circumflex.  He has a history of hypertension and hyperlipidemia and subsequently developed CTO of his RCA which upon angiographic review was not amenable for attempt at intervention.  He has been documented to have SVT which has improved with initiation of metoprolol succinate.  He believes his episodes have been anxiety mediated and since he had been placed on low-dose Klonopin he is not experiencing any tachydysrhythmia.  When I saw him in April 2022 he was not having recurrent anginal symptomatology but had experienced  some increase in shortness of breath when very active at work.  In the past, there is some issue regarding Ranexa intolerance.  He experienced some myalgias with atorvastatin and on a trial of every other day rosuvastatin he also felt similar symptoms and discontinued therapy.  Most recently he is only been on Zetia.  At his last evaluation a erythrocyte sedimentation rate was normal.  I recommended a trial of Livalo but apparently he did not tolerate this as well.  He states his brother has tolerated pravastatin and could not tolerate other agents.  For this reason I will try initiating therapy with pravastatin 20 mg first lipid-lowering therapy.  If he fails this he may be a candidate for bempedoic acid but unfortunately this is at increased cost.  He had experienced anginal episode which may have been anxiety mediated at the end of May.  Presently he is pain-free without angina on amlodipine 5 mg daily, isosorbide and instead of taking the increased dose has only been taking 15 mg in the morning and 15 mg at night, losartan 12.5 mg, and is only been taking metoprolol succinate 12.5 mg at bedtime.  He is on omega-3 fatty  acid and continues to be on DAPT with aspirin/Plavix.  He has had issues with recent panic attacks and his dose of clonidine was recently renewed by primary provider.  I am concerned with his longstanding injectable testosterone use.  His most recent laboratory from Aug 03, 2020 is now showing a hemoglobin of 19.3 and hematocrit of 57.5.  I have recommended hematologic evaluation and have suggested he see either Dr. Marin Olp or Dr. Benay Spice for evaluation.  I have suggested since he is only been taking metoprolol 12.5 mg at night and he backed off from the higher dose to at least try to take 12.5 mg alternating with 25 mg every other night.  I am rechecking a CBC comprehensive metabolic panel TSH and lipid studies in 3 months and plan follow-up office visit at that time.    Medication Adjustments/Labs and Tests Ordered: Current medicines are reviewed at length with the patient today.  Concerns regarding medicines are outlined above.  Medication changes, Labs and Tests ordered today are listed in the Patient Instructions below. Patient Instructions  Medication Instructions:  Increase metoprolol succinate to taking 1/2 tablet (12.68m) every other day alternating with full tablet (233m the other days.   BEGIN pravastatin 2030maily.   *If you need a refill on your cardiac medications before your next appointment, please call your pharmacy*   Lab Work: CBC, Cmet, TSH, Lipid to be drawn FASTING in 3 months  If you have labs (blood work) drawn today and your tests are completely normal, you will receive your results only by: [place] have [place]Chart) OR A paper copy in the mail If you have any lab test that is abnormal or we need to change your treatment, we  will call you to review the results.   Testing/Procedures: None ordered.   Follow-Up: At [facility], you and your health needs are our priority.  As part of our continuing mission to provide you with exceptional heart care,  we have created designated Provider Care Teams.  These Care Teams include your primary Cardiologist (physician) and Advanced Practice Providers (APPs -  Physician Assistants and Nurse Practitioners) who all work together to provide you with the care you need, when you need it.  We recommend signing up for the patient portal called "MyChart".  Sign up information is provided on this After Visit Summary.  MyChart is used to connect with patients for Virtual Visits (Telemedicine).  Patients are able to view lab/test results, encounter notes, upcoming appointments, etc.  Non-urgent messages can be sent to your provider as well.   To learn more about what you can do with MyChart, go to [email or web address].    Your next appointment:   4 month(s)  The format for your next appointment:   In Person  Provider:   [name], MD     Signed, [name], MD  08/15/2020 1:54 PM    [place] [address], [place], [place], [place]  [address] Phone: [phone]  8.I did okay

## 2020-08-13 NOTE — Patient Instructions (Addendum)
Medication Instructions:  Increase metoprolol succinate to taking 1/2 tablet (12.5mg ) every other day alternating with full tablet (25mg ) the other days.   BEGIN pravastatin 20mg  daily.   *If you need a refill on your cardiac medications before your next appointment, please call your pharmacy*   Lab Work: CBC, Cmet, TSH, Lipid to be drawn FASTING in 3 months  If you have labs (blood work) drawn today and your tests are completely normal, you will receive your results only by: [place] (if you have MyChart) OR A paper copy in the mail If you have any lab test that is abnormal or we need to change your treatment, we will call you to review the results.   Testing/Procedures: None ordered.   Follow-Up: At [facility], you and your health needs are our priority.  As part of our continuing mission to provide you with exceptional heart care, we have created designated Provider Care Teams.  These Care Teams include your primary Cardiologist (physician) and Advanced Practice Providers (APPs -  Physician Assistants and Nurse Practitioners) who all work together to provide you with the care you need, when you need it.  We recommend signing up for the patient portal called "MyChart".  Sign up information is provided on this After Visit Summary.  MyChart is used to connect with patients for Virtual Visits (Telemedicine).  Patients are able to view lab/test results, encounter notes, upcoming appointments, etc.  Non-urgent messages can be sent to your provider as well.   To learn more about what you can do with MyChart, go to [email or web address].    Your next appointment:   4 month(s)  The format for your next appointment:   In Person  Provider:   [name], MD

## 2020-08-14 ENCOUNTER — Telehealth: Payer: Self-pay | Admitting: Cardiovascular Disease

## 2020-08-14 NOTE — Telephone Encounter (Signed)
Left message for patient to call and schedule a 4 month follow up with Dr. [name] (per 08/13/20 AVS)

## 2020-08-15 ENCOUNTER — Encounter: Payer: Self-pay | Admitting: Cardiovascular Disease

## 2020-08-17 ENCOUNTER — Encounter: Payer: Self-pay | Admitting: Family Medicine

## 2020-08-17 NOTE — Telephone Encounter (Signed)
Left message for patient to call and schedule 4 month follow up with Dr. [name] (per 08/13/20 AVS)

## 2020-08-18 NOTE — Telephone Encounter (Signed)
Left message for patient to call and schedule the 4 month follow up visit with Dr. [name].  Appointment recall has also been entered.

## 2020-08-25 ENCOUNTER — Other Ambulatory Visit: Payer: Self-pay | Admitting: Legal Medicine

## 2020-08-25 DIAGNOSIS — F5101 Primary insomnia: Secondary | ICD-10-CM

## 2020-08-29 ENCOUNTER — Other Ambulatory Visit: Payer: Self-pay | Admitting: Student

## 2020-08-29 DIAGNOSIS — I2510 Atherosclerotic heart disease of native coronary artery without angina pectoris: Secondary | ICD-10-CM

## 2020-09-09 ENCOUNTER — Inpatient Hospital Stay: Payer: Managed Care, Other (non HMO) | Attending: Hematology and Oncology | Admitting: Hematology and Oncology

## 2020-09-09 ENCOUNTER — Inpatient Hospital Stay: Payer: Managed Care, Other (non HMO)

## 2020-09-09 ENCOUNTER — Other Ambulatory Visit: Payer: Self-pay

## 2020-09-09 VITALS — BP 129/80 | HR 64 | Temp 97.9°F | Resp 17 | Wt 219.1 lb

## 2020-09-09 DIAGNOSIS — Z923 Personal history of irradiation: Secondary | ICD-10-CM | POA: Diagnosis not present

## 2020-09-09 DIAGNOSIS — D582 Other hemoglobinopathies: Secondary | ICD-10-CM | POA: Diagnosis not present

## 2020-09-09 DIAGNOSIS — D751 Secondary polycythemia: Secondary | ICD-10-CM

## 2020-09-09 DIAGNOSIS — I2510 Atherosclerotic heart disease of native coronary artery without angina pectoris: Secondary | ICD-10-CM | POA: Insufficient documentation

## 2020-09-09 DIAGNOSIS — Z79890 Hormone replacement therapy (postmenopausal): Secondary | ICD-10-CM

## 2020-09-09 DIAGNOSIS — Z8546 Personal history of malignant neoplasm of prostate: Secondary | ICD-10-CM | POA: Diagnosis not present

## 2020-09-09 DIAGNOSIS — F419 Anxiety disorder, unspecified: Secondary | ICD-10-CM | POA: Diagnosis not present

## 2020-09-09 DIAGNOSIS — Z7982 Long term (current) use of aspirin: Secondary | ICD-10-CM | POA: Insufficient documentation

## 2020-09-09 LAB — CBC WITH DIFFERENTIAL (CANCER CENTER ONLY)
Abs Immature Granulocytes: 0.03 10*3/uL (ref 0.00–0.07)
Basophils Absolute: 0.1 10*3/uL (ref 0.0–0.1)
Basophils Relative: 1 %
Eosinophils Absolute: 0.2 10*3/uL (ref 0.0–0.5)
Eosinophils Relative: 3 %
HCT: 52.4 % — ABNORMAL HIGH (ref 39.0–52.0)
Hemoglobin: 18.2 g/dL — ABNORMAL HIGH (ref 13.0–17.0)
Immature Granulocytes: 0 %
Lymphocytes Relative: 23 %
Lymphs Abs: 1.6 10*3/uL (ref 0.7–4.0)
MCH: 30.5 pg (ref 26.0–34.0)
MCHC: 34.7 g/dL (ref 30.0–36.0)
MCV: 87.9 fL (ref 80.0–100.0)
Monocytes Absolute: 0.5 10*3/uL (ref 0.1–1.0)
Monocytes Relative: 7 %
Neutro Abs: 4.7 10*3/uL (ref 1.7–7.7)
Neutrophils Relative %: 66 %
Platelet Count: 163 10*3/uL (ref 150–400)
RBC: 5.96 MIL/uL — ABNORMAL HIGH (ref 4.22–5.81)
RDW: 15.2 % (ref 11.5–15.5)
WBC Count: 7.1 10*3/uL (ref 4.0–10.5)
nRBC: 0 % (ref 0.0–0.2)

## 2020-09-09 LAB — CMP (CANCER CENTER ONLY)
ALT: 25 U/L (ref 0–44)
AST: 21 U/L (ref 15–41)
Albumin: 4.1 g/dL (ref 3.5–5.0)
Alkaline Phosphatase: 59 U/L (ref 38–126)
Anion gap: 9 (ref 5–15)
BUN: 17 mg/dL (ref 8–23)
CO2: 23 mmol/L (ref 22–32)
Calcium: 9.6 mg/dL (ref 8.9–10.3)
Chloride: 107 mmol/L (ref 98–111)
Creatinine: 1.11 mg/dL (ref 0.61–1.24)
GFR, Estimated: >60 mL/min (ref >60)
Glucose, Bld: 89 mg/dL (ref 70–99)
Potassium: 4.3 mmol/L (ref 3.5–5.1)
Sodium: 139 mmol/L (ref 135–145)
Total Bilirubin: 0.8 mg/dL (ref 0.3–1.2)
Total Protein: 7.2 g/dL (ref 6.5–8.1)

## 2020-09-09 LAB — IRON AND TIBC
Iron: 61 ug/dL (ref 42–163)
Saturation Ratios: 20 % (ref 20–55)
TIBC: 310 ug/dL (ref 202–409)
UIBC: 249 ug/dL (ref 117–376)

## 2020-09-09 LAB — FERRITIN: Ferritin: 211 ng/mL (ref 24–336)

## 2020-09-09 NOTE — Progress Notes (Signed)
[place] Telephone:([phone]) [phone]   Fax:([phone]) [place] NOTE  Patient Care Team: [name], MD as PCP - General (Family Medicine) Troy Sine, MD as PCP - Cardiology (Cardiology)  Hematological/Oncological History # Polycythemia 06/11/2019: WBC 6.9, Hgb 15.5, MCV 91.4, Plt 219 04/01/2020: WBC 8.6, Hgb 18.4, MCV 87, Plt 219 08/03/2020: WBC 8.8, Hgb 19.3, MCV 88.7, Plt 187 09/09/2020: establish care with Dr. Lorenso Courier   CHIEF COMPLAINTS/PURPOSE OF CONSULTATION:  "Polycythemia "  HISTORY OF PRESENTING ILLNESS:  Jacob Collier 65 y.o. male with medical history significant for CAD, diverticulosis, HTN, and arthritis who presents for evaluation of polycythemia.   On review of the previous records Jacob Collier has records of polycythemia dating back to 06/24/2009 at which time he had a hemoglobin of 17.1.  On 10/02/2014 the patient had a hemoglobin of 17.8.  He had normal hemoglobin from 2017 all the way up until 2021.  In January 2022 patient had a hemoglobin of 18.4.  When repeated on 05/18/2020 it remained elevated.  Most recently on 08/03/2020 the patient had a white blood cell count 8.8, hemoglobin 19.3, MCV of 88.7, hematocrit of 57.5, and a platelet count of 187.  Throughout this course he has had no episodes of leukocytosis or thrombocytosis.  His red blood cells has remained within normal MCV during this period of time.  On exam today Jacob Collier is accompanied by his wife.  He request that we keep the door open due to his levels of anxiety.  He notes that he has had issues with elevated hemoglobin for a long period of time.  He reports that his statin medications may likely be the cause.  He notes that he is a never smoker, but that he does have symptoms concerning for sleep apnea.  He does stop breathing at night and does wake up frequently throughout the night.  He also is prone to daytime somnolence.  Sleep trouble has been a consistent issue for him.   He notes that he is undergone testing before in [facility] and that reportedly those tests were "negative".  In terms of his prostate cancer he was treated with surgical resection in 2015 but due to biochemical recurrence in 2018 was treated with radiation therapy.  [facility] currently follows with [facility].  Interestingly he is taking hormone replacement therapy with testosterone and takes shots approximately every 7 to 8 days.  He has been warned against the use of testosterone in the setting of prostate cancer but wishes to continue this medication.  On further discussion he has a family history remarkable for Alzheimer's in mother, stroke in his father and prostate cancer in 2 maternal uncles.  He has no children.  He otherwise denies any fevers, chills, sweats, nausea, vomiting or diarrhea.  A full 10 point ROS is listed below.  MEDICAL HISTORY:  Past Medical History:  Diagnosis Date   Arthritis    Coronary artery disease    2 stents   Diverticulosis    Hard of hearing    Hypertension    hx of elevation on lyrica, off med and now normal    Wears glasses     SURGICAL HISTORY: Past Surgical History:  Procedure Laterality Date   CARDIAC CATHETERIZATION     with 2 stents    COLECTOMY  05/2018   EYE SURGERY  02/2020   bilaterl cataracts   PROSTATECTOMY     TOTAL HIP ARTHROPLASTY Left 11/10/2015   Procedure:  LEFT TOTAL HIP ARTHROPLASTY ANTERIOR APPROACH;  Surgeon: Mcarthur Rossetti, MD;  Location: [facility];  Service: Orthopedics;  Laterality: Left;    SOCIAL HISTORY: Social History   Socioeconomic History   Marital status: Married    Spouse name: Not on file   Number of children: 0   Years of education: Not on file   Highest education level: Not on file  Occupational History   Occupation: Employed at [employer]  Tobacco Use   Smoking status: Never   Smokeless tobacco: Never  Vaping Use   Vaping Use: Former  Substance and Sexual Activity   Alcohol use:  Not Currently    Alcohol/week: 0.0 standard drinks    Comment: rarely   Drug use: No   Sexual activity: Yes    Partners: Female    Birth control/protection: None  Other Topics Concern   Not on file  Social History Narrative   Not on file   Social Determinants of Health   Financial Resource Strain: Not on file  Food Insecurity: Not on file  Transportation Needs: Not on file  Physical Activity: Not on file  Stress: Not on file  Social Connections: Not on file  Intimate Partner Violence: Not on file    FAMILY HISTORY: Family History  Problem Relation Age of Onset   Alzheimer's disease Mother    Stroke Father    Diabetes Sister    Heart attack Maternal Grandmother    Cancer Maternal Uncle        prostate   Prostate cancer Maternal Uncle    Cancer Maternal Uncle        prostate   Prostate cancer Maternal Uncle    Colon polyps Brother    Colon cancer Neg Hx    Esophageal cancer Neg Hx    Rectal cancer Neg Hx    Stomach cancer Neg Hx     ALLERGIES:  is allergic to ranolazine and statins.  MEDICATIONS:  Current Outpatient Medications  Medication Sig Dispense Refill   clopidogrel (PLAVIX) 75 MG tablet TAKE 1 TABLET BY MOUTH EVERY DAY 90 tablet 1   amLODipine (NORVASC) 2.5 MG tablet TAKE 1 TABLET BY MOUTH EVERY DAY (Patient taking differently: Take 2 2.5 mg for 5 mg total.) 90 tablet 3   aspirin 81 MG EC tablet Take 81 mg by mouth daily.      clonazePAM (KLONOPIN) 0.5 MG tablet TAKE 1 TABLET (0.5 MG TOTAL) BY MOUTH 3 (THREE) TIMES DAILY AS NEEDED FOR ANXIETY. 90 tablet 3   Eszopiclone 3 MG TABS TAKE 1 TABLET BY MOUTH AT BEDTIME. 30 tablet 2   ezetimibe (ZETIA) 10 MG tablet TAKE 1 TABLET BY MOUTH EVERY DAY 90 tablet 3   isosorbide mononitrate (IMDUR) 30 MG 24 hr tablet Take 30 mg in the morning, and 15 mg in the evening. (Patient taking differently: Take 14m in the morning, and 15 mg in the evening.) 180 tablet 1   losartan (COZAAR) 25 MG tablet TAKE 1 TABLET BY MOUTH  EVERY DAY (Patient taking differently: Takes 1/2 tablet) 90 tablet 3   metoprolol succinate (TOPROL XL) 25 MG 24 hr tablet Take 1/2 tablet (12.585m every other day alternating with full tablet (2519mthe other days. 90 tablet 3   nitroGLYCERIN (NITROSTAT) 0.4 MG SL tablet Place 1 tablet (0.4 mg total) under the tongue every 5 (five) minutes as needed for chest pain. 25 tablet 2   Omega-3 Fatty Acids (FISH OIL) 1000 MG CAPS Take 1,000 mg by mouth  daily.      pravastatin (PRAVACHOL) 20 MG tablet Take 1 tablet (20 mg total) by mouth every evening. 90 tablet 0   No current facility-administered medications for this visit.    REVIEW OF SYSTEMS:   Constitutional: ( - ) fevers, ( - )  chills , ( - ) night sweats Eyes: ( - ) blurriness of vision, ( - ) double vision, ( - ) watery eyes Ears, nose, mouth, throat, and face: ( - ) mucositis, ( - ) sore throat Respiratory: ( - ) cough, ( - ) dyspnea, ( - ) wheezes Cardiovascular: ( - ) palpitation, ( - ) chest discomfort, ( - ) lower extremity swelling Gastrointestinal:  ( - ) nausea, ( - ) heartburn, ( - ) change in bowel habits Skin: ( - ) abnormal skin rashes Lymphatics: ( - ) new lymphadenopathy, ( - ) easy bruising Neurological: ( - ) numbness, ( - ) tingling, ( - ) new weaknesses Behavioral/Psych: ( - ) mood change, ( - ) new changes  All other systems were reviewed with the patient and are negative.  PHYSICAL EXAMINATION:  Vitals:   09/09/20 1301  BP: 129/80  Pulse: 64  Resp: 17  Temp: 97.9 F (36.6 C)  SpO2: 96%   Filed Weights   09/09/20 1301  Weight: 219 lb 1.6 oz (99.4 kg)    GENERAL: well appearing ready Caucasian male in NAD  SKIN: skin color, texture, turgor are normal, no rashes or significant lesions EYES: conjunctiva are pink and non-injected, sclera clear LUNGS: clear to auscultation and percussion with normal breathing effort HEART: regular rate & rhythm and no murmurs and no lower extremity edema PSYCH: alert &  oriented x 3, fluent speech NEURO: no focal motor/sensory deficits  LABORATORY DATA:  I have reviewed the data as listed CBC Latest Ref Rng & Units 09/09/2020 08/03/2020 06/22/2020  WBC 4.0 - 10.5 K/uL 7.1 8.8 7.0  Hemoglobin 13.0 - 17.0 g/dL 18.2(H) 19.3(H) 18.0(H)  Hematocrit 39.0 - 52.0 % 52.4(H) 57.5(H) 53.4(H)  Platelets 150 - 400 K/uL 163 187 245    CMP Latest Ref Rng & Units 09/09/2020 08/03/2020 06/22/2020  Glucose 70 - 99 mg/dL 89 94 101(H)  BUN 8 - 23 mg/dL 17 14 12   Creatinine 0.61 - 1.24 mg/dL 1.11 1.11 0.83  Sodium 135 - 145 mmol/L 139 134(L) 138  Potassium 3.5 - 5.1 mmol/L 4.3 5.2(H) 4.4  Chloride 98 - 111 mmol/L 107 105 106  CO2 22 - 32 mmol/L 23 22 17(L)  Calcium 8.9 - 10.3 mg/dL 9.6 9.1 9.5  Total Protein 6.5 - 8.1 g/dL 7.2 - 6.4  Total Bilirubin 0.3 - 1.2 mg/dL 0.8 - 0.3  Alkaline Phos 38 - 126 U/L 59 - 68  AST 15 - 41 U/L 21 - 18  ALT 0 - 44 U/L 25 - 30    RADIOGRAPHIC STUDIES: No results found.  ASSESSMENT & PLAN Jacob Collier 65 y.o. male with medical history significant for CAD, diverticulosis, HTN, and arthritis who presents for evaluation of polycythemia.   After review of the labs, review of the records, and discussion with the patient the patients findings are most consistent with a secondary polycythemia.  The patient has 2 possible sources including obstructive sleep apnea or exogenous testosterone use.  The patient has sleep patterns most consistent with obstructive sleep apnea.  Additionally the patient takes shots of testosterone every 7 to 8 days which may be boosting his hemoglobin levels.  We discussed  with the patient that secondary polycythemia does not increase the risk of VTE.  He does take aspirin for his history of CAD.  We will order a JAK2 panel as well as a BCR ABL FISH in order to assess for primary causes of his polycythemia.  # Polycythemia, Likely Secondary -- Findings at this time are most consistent with a secondary polycythemia due to  either obstructive sleep apnea or exogenous testosterone injections. -- Today we will order CBC, CMP, and erythropoietin level --We will additionally order iron panel and ferritin --Work-up to include JAK2 with reflex and BCR/ABL FISH. --No indication for a bone marrow biopsy at this time. -- Return to clinic in 6 months time or sooner if a primary polycythemia is evident.  #Prostate Cancer --s/p prostatectomy in 2015, biochemical recurrence treated in 2018 with radiation therapy --currently follows with [facility] -- Discussed with patient that testosterone shots in the setting of history of prostate cancer is not advisable. --we can assist in the management if requested.   Orders Placed This Encounter  Procedures   CBC with Differential ([place] Only)    Standing Status:   Future    Number of Occurrences:   1    Standing Expiration Date:   09/09/2021   CMP ([employer] [facility] [employer] only)    Standing Status:   Future    Number of Occurrences:   1    Standing Expiration Date:   09/09/2021   Erythropoietin    Standing Status:   Future    Number of Occurrences:   1    Standing Expiration Date:   09/09/2021   JAK2 (INCLUDING V617F AND EXON 12), MPL,& CALR W/RFL MPN PANEL (NGS)    Standing Status:   Future    Number of Occurrences:   1    Standing Expiration Date:   09/09/2021   BCR ABL1 FISH ([facility])    Standing Status:   Future    Number of Occurrences:   1    Standing Expiration Date:   09/09/2021   Ferritin    Standing Status:   Future    Number of Occurrences:   1    Standing Expiration Date:   09/09/2021   Iron and TIBC    Standing Status:   Future    Number of Occurrences:   1    Standing Expiration Date:   09/09/2021   Polysomnography 4 or more parameters    Standing Status:   Future    Standing Expiration Date:   09/09/2021    Order Specific Question:   Where should this test be performed:    Answer:   [place]    All questions were answered. The patient  knows to call the clinic with any problems, questions or concerns.  A total of more than 60 minutes were spent on this encounter with face-to-face time and non-face-to-face time, including preparing to see the patient, ordering tests and/or medications, counseling the patient and coordination of care as outlined above.   Ledell Peoples, MD Department of Hematology/Oncology [place] at [facility] Phone: [phone] Pager: [phone] Email: [email or web address]  09/09/2020 4:15 PM

## 2020-09-10 ENCOUNTER — Telehealth: Payer: Self-pay | Admitting: Hematology and Oncology

## 2020-09-10 ENCOUNTER — Other Ambulatory Visit: Payer: Self-pay | Admitting: Legal Medicine

## 2020-09-10 DIAGNOSIS — I25110 Atherosclerotic heart disease of native coronary artery with unstable angina pectoris: Secondary | ICD-10-CM

## 2020-09-10 LAB — ERYTHROPOIETIN: Erythropoietin: 6.3 m[IU]/mL (ref 2.6–18.5)

## 2020-09-10 NOTE — Telephone Encounter (Signed)
Left message with follow-up appointment per 7/6 los. 

## 2020-09-11 ENCOUNTER — Other Ambulatory Visit: Payer: Self-pay

## 2020-09-16 LAB — JAK2 (INCLUDING V617F AND EXON 12), MPL,& CALR W/RFL MPN PANEL (NGS)

## 2020-09-16 LAB — BCR ABL1 FISH (GENPATH)

## 2020-09-21 ENCOUNTER — Encounter: Payer: Self-pay | Admitting: Legal Medicine

## 2020-09-21 ENCOUNTER — Telehealth (INDEPENDENT_AMBULATORY_CARE_PROVIDER_SITE_OTHER): Payer: Managed Care, Other (non HMO) | Admitting: Legal Medicine

## 2020-09-21 DIAGNOSIS — U071 COVID-19: Secondary | ICD-10-CM

## 2020-09-21 DIAGNOSIS — J22 Unspecified acute lower respiratory infection: Secondary | ICD-10-CM | POA: Insufficient documentation

## 2020-09-21 MED ORDER — NIRMATRELVIR/RITONAVIR (PAXLOVID)TABLET: 3.0000 | ORAL_TABLET | Freq: Two times a day (BID) | ORAL | 0 refills | Status: AC

## 2020-09-21 NOTE — Progress Notes (Signed)
Virtual Visit via Video Note   This visit type was conducted due to national recommendations for restrictions regarding the COVID-19 Pandemic (e.g. social distancing) in an effort to limit this patient's exposure and mitigate transmission in our community.  Due to his co-morbid illnesses, this patient is at least at moderate risk for complications without adequate follow up.  This format is felt to be most appropriate for this patient at this time.  All issues noted in this document were discussed and addressed.  A limited physical exam was performed with this format.  A verbal consent was obtained for the virtual visit.   Date:  09/21/2020   ID:  Jacob Collier, DOB 04-24-1955, MRN [id number]  Patient Location: Home Physician location: office  PCP:  [name], MD   Evaluation Performed:  New Patient Evaluation  Chief Complaint:  cough  History of Present Illness:    Jacob Collier is a 65 y.o. male with cough, for one week, wife has covid, no fever. Negative home covid test.  The patient does have symptoms concerning for COVID-19 infection (fever, chills, cough, or new shortness of breath).    Past Medical History:  Diagnosis Date   Arthritis    Coronary artery disease    2 stents   Diverticulosis    Hard of hearing    Hypertension    hx of elevation on lyrica, off med and now normal    Wears glasses     Past Surgical History:  Procedure Laterality Date   CARDIAC CATHETERIZATION     with 2 stents    COLECTOMY  05/2018   EYE SURGERY  02/2020   bilaterl cataracts   PROSTATECTOMY     TOTAL HIP ARTHROPLASTY Left 11/10/2015   Procedure: LEFT TOTAL HIP ARTHROPLASTY ANTERIOR APPROACH;  Surgeon: Mcarthur Rossetti, MD;  Location: [facility];  Service: Orthopedics;  Laterality: Left;    Family History  Problem Relation Age of Onset   Alzheimer's disease Mother    Stroke Father    Diabetes Sister    Heart attack Maternal Grandmother    Cancer Maternal  Uncle        prostate   Prostate cancer Maternal Uncle    Cancer Maternal Uncle        prostate   Prostate cancer Maternal Uncle    Colon polyps Brother    Colon cancer Neg Hx    Esophageal cancer Neg Hx    Rectal cancer Neg Hx    Stomach cancer Neg Hx     Social History   Socioeconomic History   Marital status: Married    Spouse name: Not on file   Number of children: 0   Years of education: Not on file   Highest education level: Not on file  Occupational History   Occupation: Employed at [employer]  Tobacco Use   Smoking status: Never   Smokeless tobacco: Never  Vaping Use   Vaping Use: Former  Substance and Sexual Activity   Alcohol use: Not Currently    Alcohol/week: 0.0 standard drinks    Comment: rarely   Drug use: No   Sexual activity: Yes    Partners: Female    Birth control/protection: None  Other Topics Concern   Not on file  Social History Narrative   Not on file   Social Determinants of Health   Financial Resource Strain: Not on file  Food Insecurity: Not on file  Transportation Needs: Not on file  Physical  Activity: Not on file  Stress: Not on file  Social Connections: Not on file  Intimate Partner Violence: Not on file    Outpatient Medications Prior to Visit  Medication Sig Dispense Refill   amLODipine (NORVASC) 2.5 MG tablet TAKE 1 TABLET BY MOUTH EVERY DAY (Patient taking differently: Take 2 2.5 mg for 5 mg total.) 90 tablet 3   aspirin 81 MG EC tablet Take 81 mg by mouth daily.      celecoxib (CELEBREX) 200 MG capsule Take by mouth 2 (two) times daily.     clonazePAM (KLONOPIN) 0.5 MG tablet TAKE 1 TABLET (0.5 MG TOTAL) BY MOUTH 3 (THREE) TIMES DAILY AS NEEDED FOR ANXIETY. 90 tablet 1   clopidogrel (PLAVIX) 75 MG tablet TAKE 1 TABLET BY MOUTH EVERY DAY 90 tablet 1   Eszopiclone 3 MG TABS TAKE 1 TABLET BY MOUTH AT BEDTIME. 30 tablet 2   ezetimibe (ZETIA) 10 MG tablet TAKE 1 TABLET BY MOUTH EVERY DAY 90 tablet 3   isosorbide mononitrate  (IMDUR) 30 MG 24 hr tablet Take 30 mg in the morning, and 15 mg in the evening. (Patient taking differently: Take 15mg  in the morning, and 15 mg in the evening.) 180 tablet 1   losartan (COZAAR) 25 MG tablet TAKE 1 TABLET BY MOUTH EVERY DAY (Patient taking differently: Takes 1/2 tablet) 90 tablet 3   metoprolol succinate (TOPROL XL) 25 MG 24 hr tablet Take 1/2 tablet (12.5mg ) every other day alternating with full tablet (25mg ) the other days. 90 tablet 3   Omega-3 Fatty Acids (FISH OIL) 1000 MG CAPS Take 1,000 mg by mouth daily.      pravastatin (PRAVACHOL) 20 MG tablet Take 1 tablet (20 mg total) by mouth every evening. 90 tablet 0   nitroGLYCERIN (NITROSTAT) 0.4 MG SL tablet Place 1 tablet (0.4 mg total) under the tongue every 5 (five) minutes as needed for chest pain. 25 tablet 2   No facility-administered medications prior to visit.    Allergies:   Ranolazine and Statins   Social History   Tobacco Use   Smoking status: Never   Smokeless tobacco: Never  Vaping Use   Vaping Use: Former  Substance Use Topics   Alcohol use: Not Currently    Alcohol/week: 0.0 standard drinks    Comment: rarely   Drug use: No     Review of Systems  Constitutional:  Positive for malaise/fatigue. Negative for chills and fever.  HENT:  Negative for hearing loss.   Eyes:  Negative for redness.  Respiratory:  Negative for cough and hemoptysis.   Cardiovascular:  Negative for chest pain and orthopnea.  Genitourinary:  Negative for dysuria.  Musculoskeletal:  Positive for myalgias.  Neurological: Negative.   Psychiatric/Behavioral: Negative.      Labs/Other Tests and Data Reviewed:    Recent Labs: 09/09/2020: ALT 25; BUN 17; Creatinine 1.11; Hemoglobin 18.2; Platelet Count 163; Potassium 4.3; Sodium 139   Recent Lipid Panel Lab Results  Component Value Date/Time   CHOL 108 09/13/2019 11:23 AM   TRIG 119 09/13/2019 11:23 AM   HDL 28 (L) 09/13/2019 11:23 AM   CHOLHDL 3.9 09/13/2019 11:23 AM    CHOLHDL 4.8 06/06/2019 06:12 AM   LDLCALC 58 09/13/2019 11:23 AM    Wt Readings from Last 3 Encounters:  09/21/20 212 lb (96.2 kg)  09/09/20 219 lb 1.6 oz (99.4 kg)  08/13/20 215 lb 12.8 oz (97.9 kg)     Objective:    Vital Signs:  BP 133/74  Pulse (!) 51   Temp (!) 97.5 F (36.4 C)   Ht 5\' 11"  (1.803 m)   Wt 212 lb (96.2 kg)   SpO2 95%   BMI 29.57 kg/m    Physical Exam none  ASSESSMENT & PLAN:   Diagnoses and all orders for this visit: COVID -     nirmatrelvir/ritonavir EUA (PAXLOVID) TABS; Take 3 tablets by mouth 2 (two) times daily for 5 days. (Take nirmatrelvir 150 mg two tablets twice daily for 5 days and ritonavir 100 mg one tablet twice daily for 5 days) Patient GFR is 97  Called in Paxlovid      COVID-19 Education: The signs and symptoms of COVID-19 were discussed with the patient and how to seek care for testing (follow up with PCP or arrange E-visit). The importance of social distancing was discussed today.   I spent 38minutes dedicated to the care of this patient on the date of this encounter to include face-to-face time with the patient, as well as: review of records  Follow Up:  In Person prn  Signed, Reinaldo Meeker, MD  09/21/2020 10:06 AM    [place]

## 2020-10-09 ENCOUNTER — Telehealth: Payer: Self-pay | Admitting: *Deleted

## 2020-10-09 NOTE — Telephone Encounter (Signed)
Received vm message from pt regarding his sleep study appt. He states he has not heard from them yet. Call made to sleep study center @ [facility] [phone]). They are waiting for authorization from his insurance company. As soon as they get that, they will call pt to schedule.  Call made to pt and left vm message on identified phone with the above information.

## 2020-10-26 ENCOUNTER — Other Ambulatory Visit: Payer: Self-pay

## 2020-10-26 ENCOUNTER — Ambulatory Visit (INDEPENDENT_AMBULATORY_CARE_PROVIDER_SITE_OTHER): Payer: Managed Care, Other (non HMO) | Admitting: Legal Medicine

## 2020-10-26 ENCOUNTER — Encounter: Payer: Self-pay | Admitting: Legal Medicine

## 2020-10-26 VITALS — BP 120/60 | HR 60 | Temp 97.0°F | Ht 71.0 in | Wt 220.4 lb

## 2020-10-26 DIAGNOSIS — C61 Malignant neoplasm of prostate: Secondary | ICD-10-CM

## 2020-10-26 DIAGNOSIS — M25561 Pain in right knee: Secondary | ICD-10-CM

## 2020-10-26 DIAGNOSIS — G8929 Other chronic pain: Secondary | ICD-10-CM | POA: Diagnosis not present

## 2020-10-26 DIAGNOSIS — M25562 Pain in left knee: Secondary | ICD-10-CM

## 2020-10-26 DIAGNOSIS — M25551 Pain in right hip: Secondary | ICD-10-CM

## 2020-10-26 DIAGNOSIS — M25552 Pain in left hip: Secondary | ICD-10-CM

## 2020-10-26 MED ORDER — PREDNISONE 10 MG (21) PO TBPK: ORAL_TABLET | ORAL | 0 refills | Status: DC

## 2020-10-26 NOTE — Progress Notes (Signed)
Established Patient Office Visit  Subjective:  Patient ID: Jacob Collier, male    DOB: November 16, 1955  Age: 65 y.o. MRN: [id number]  CC:  Chief Complaint  Patient presents with   Joint Pain    Stated Sunday morning.     HPI Jacob Collier presents for aching all over, he was working on old tractor Saturday.  All his joints ache today. No tick bites.  No effusion  He can walk  Past Medical History:  Diagnosis Date   Arthritis    Coronary artery disease    2 stents   Diverticulosis    Hard of hearing    Hypertension    hx of elevation on lyrica, off med and now normal    Wears glasses     Past Surgical History:  Procedure Laterality Date   CARDIAC CATHETERIZATION     with 2 stents    COLECTOMY  05/2018   EYE SURGERY  02/2020   bilaterl cataracts   PROSTATECTOMY     TOTAL HIP ARTHROPLASTY Left 11/10/2015   Procedure: LEFT TOTAL HIP ARTHROPLASTY ANTERIOR APPROACH;  Surgeon: Mcarthur Rossetti, MD;  Location: [facility];  Service: Orthopedics;  Laterality: Left;    Family History  Problem Relation Age of Onset   Alzheimer's disease Mother    Stroke Father    Diabetes Sister    Heart attack Maternal Grandmother    Cancer Maternal Uncle        prostate   Prostate cancer Maternal Uncle    Cancer Maternal Uncle        prostate   Prostate cancer Maternal Uncle    Colon polyps Brother    Colon cancer Neg Hx    Esophageal cancer Neg Hx    Rectal cancer Neg Hx    Stomach cancer Neg Hx     Social History   Socioeconomic History   Marital status: Married    Spouse name: Not on file   Number of children: 0   Years of education: Not on file   Highest education level: Not on file  Occupational History   Occupation: Employed at [employer]  Tobacco Use   Smoking status: Never   Smokeless tobacco: Never  Vaping Use   Vaping Use: Former  Substance and Sexual Activity   Alcohol use: Not Currently    Alcohol/week: 0.0 standard drinks    Comment: rarely   Drug  use: No   Sexual activity: Yes    Partners: Female    Birth control/protection: None  Other Topics Concern   Not on file  Social History Narrative   Not on file   Social Determinants of Health   Financial Resource Strain: Not on file  Food Insecurity: Not on file  Transportation Needs: Not on file  Physical Activity: Not on file  Stress: Not on file  Social Connections: Not on file  Intimate Partner Violence: Not on file    Outpatient Medications Prior to Visit  Medication Sig Dispense Refill   amLODipine (NORVASC) 2.5 MG tablet TAKE 1 TABLET BY MOUTH EVERY DAY (Patient taking differently: Take 2 2.5 mg for 5 mg total.) 90 tablet 3   aspirin 81 MG EC tablet Take 81 mg by mouth daily.      celecoxib (CELEBREX) 200 MG capsule Take by mouth 2 (two) times daily.     clonazePAM (KLONOPIN) 0.5 MG tablet TAKE 1 TABLET (0.5 MG TOTAL) BY MOUTH 3 (THREE) TIMES DAILY AS NEEDED FOR ANXIETY. [place]  tablet 1   clopidogrel (PLAVIX) 75 MG tablet TAKE 1 TABLET BY MOUTH EVERY DAY 90 tablet 1   Eszopiclone 3 MG TABS TAKE 1 TABLET BY MOUTH AT BEDTIME. 30 tablet 2   ezetimibe (ZETIA) 10 MG tablet TAKE 1 TABLET BY MOUTH EVERY DAY 90 tablet 3   isosorbide mononitrate (IMDUR) 30 MG 24 hr tablet Take 30 mg in the morning, and 15 mg in the evening. (Patient taking differently: Take 69m in the morning, and 15 mg in the evening.) 180 tablet 1   losartan (COZAAR) 25 MG tablet TAKE 1 TABLET BY MOUTH EVERY DAY (Patient taking differently: Takes 1/2 tablet) 90 tablet 3   metoprolol succinate (TOPROL XL) 25 MG 24 hr tablet Take 1/2 tablet (12.537m every other day alternating with full tablet (2575mthe other days. 90 tablet 3   nitroGLYCERIN (NITROSTAT) 0.4 MG SL tablet Place 1 tablet (0.4 mg total) under the tongue every 5 (five) minutes as needed for chest pain. 25 tablet 2   Omega-3 Fatty Acids (FISH OIL) 1000 MG CAPS Take 1,000 mg by mouth daily.      pravastatin (PRAVACHOL) 20 MG tablet Take 1 tablet (20 mg  total) by mouth every evening. 90 tablet 0   No facility-administered medications prior to visit.    Allergies  Allergen Reactions   Ranolazine     Other reaction(s): Other (See Comments) Chest discomfort/Reflux   Statins Other (See Comments)    myalgias    ROS Review of Systems  Constitutional:  Negative for chills, fatigue and fever.  HENT:  Negative for congestion, ear pain and sore throat.   Respiratory:  Negative for cough and shortness of breath.   Cardiovascular:  Negative for chest pain.  Gastrointestinal:  Negative for abdominal pain, constipation, diarrhea, nausea and vomiting.  Endocrine: Negative for polydipsia, polyphagia and polyuria.  Genitourinary:  Negative for dysuria and frequency.  Musculoskeletal:  Negative for arthralgias and myalgias.  Neurological:  Negative for dizziness and headaches.  Psychiatric/Behavioral:  Negative for dysphoric mood.        No dysphoria     Objective:    Physical Exam Vitals reviewed.  Constitutional:      Appearance: Normal appearance.  HENT:     Right Ear: Tympanic membrane, ear canal and external ear normal.     Left Ear: Tympanic membrane, ear canal and external ear normal.     Mouth/Throat:     Mouth: Mucous membranes are dry.     Pharynx: Oropharynx is clear.  Eyes:     Extraocular Movements: Extraocular movements intact.     Conjunctiva/sclera: Conjunctivae normal.     Pupils: Pupils are equal, round, and reactive to light.  Cardiovascular:     Rate and Rhythm: Normal rate and regular rhythm.     Pulses: Normal pulses.     Heart sounds: Normal heart sounds. No murmur heard.   No gallop.  Pulmonary:     Effort: Pulmonary effort is normal. No respiratory distress.     Breath sounds: Normal breath sounds. No wheezing.  Abdominal:     General: Abdomen is flat. Bowel sounds are normal. There is no distension.     Palpations: Abdomen is soft.     Tenderness: There is no abdominal tenderness.  Musculoskeletal:         General: Tenderness present. No swelling.     Cervical back: Normal range of motion.  Skin:    General: Skin is warm.     Capillary Refill:  Capillary refill takes less than 2 seconds.  Neurological:     General: No focal deficit present.     Mental Status: He is alert. Mental status is at baseline.    BP 120/60   Pulse 60   Temp (!) 97 F (36.1 C)   Ht _0  (1.803 m)   Wt 220 lb 6.4 oz (100 kg)   SpO2 97%   BMI 30.74 kg/m  Wt Readings from Last 3 Encounters:  10/26/20 220 lb 6.4 oz (100 kg)  09/21/20 212 lb (96.2 kg)  09/09/20 219 lb 1.6 oz (99.4 kg)     Health Maintenance Due  Topic Date Due   Hepatitis C Screening  Never done   TETANUS/TDAP  Never done   Zoster Vaccines- Shingrix (1 of 2) Never done   PNA vac Low Risk Adult (1 of 2 - PCV13) Never done   COVID-19 Vaccine (2 - Janssen risk series) 08/07/2019   INFLUENZA VACCINE  10/05/2020    There are no preventive care reminders to display for this patient.  No results found for: TSH Lab Results  Component Value Date   WBC 7.1 09/09/2020   HGB 18.2 (H) 09/09/2020   HCT 52.4 (H) 09/09/2020   MCV 87.9 09/09/2020   PLT 163 09/09/2020   Lab Results  Component Value Date   NA 139 09/09/2020   K 4.3 09/09/2020   CO2 23 09/09/2020   GLUCOSE 89 09/09/2020   BUN 17 09/09/2020   CREATININE 1.11 09/09/2020   BILITOT 0.8 09/09/2020   ALKPHOS 59 09/09/2020   AST 21 09/09/2020   ALT 25 09/09/2020   PROT 7.2 09/09/2020   ALBUMIN 4.1 09/09/2020   CALCIUM 9.6 09/09/2020   ANIONGAP 9 09/09/2020   EGFR 97 06/22/2020   Lab Results  Component Value Date   CHOL 108 09/13/2019   Lab Results  Component Value Date   HDL 28 (L) 09/13/2019   Lab Results  Component Value Date   LDLCALC 58 09/13/2019   Lab Results  Component Value Date   TRIG 119 09/13/2019   Lab Results  Component Value Date   CHOLHDL 3.9 09/13/2019   Lab Results  Component Value Date   HGBA1C 5.8 (H) 11/28/2019      Assessment  & Plan:   Problem List Items Addressed This Visit       Genitourinary   Prostate cancer ([place])   Relevant Medications   predniSONE (STERAPRED UNI-PAK 21 TAB) 10 MG (21) TBPK tablet   Other Relevant Orders   PSA Patient is being treated for prostate infection     Other   Chronic arthralgias of knees and hips - Primary   Relevant Medications   predniSONE (STERAPRED UNI-PAK 21 TAB) 10 MG (21) TBPK tablet   Other Relevant Orders   C-reactive protein   [place]y mtn spotted fvr ab, IgM-blood   Lyme Disease Serology w/Reflex Diffuse myalgias, treat with prednisone, check for tick born infection    Meds ordered this encounter  Medications   predniSONE (STERAPRED UNI-PAK 21 TAB) 10 MG (21) TBPK tablet    Sig: Take 6ills first day , then 5 pills day 2 and then cut down one pill day until gone    Dispense:  21 tablet    Refill:  0     Follow-up: Return if symptoms worsen or fail to improve.    Reinaldo Meeker, MD

## 2020-10-29 LAB — C-REACTIVE PROTEIN: CRP: 1 mg/L (ref 0–10)

## 2020-10-29 LAB — ROCKY MTN SPOTTED FVR AB, IGM-BLOOD: RMSF IgM: 0.28 index (ref 0.00–0.89)

## 2020-10-29 LAB — PSA: Prostate Specific Ag, Serum: <0.1 ng/mL (ref 0.0–4.0)

## 2020-10-29 LAB — LYME DISEASE SEROLOGY W/REFLEX: Lyme Total Antibody EIA: NEGATIVE

## 2020-10-29 NOTE — Progress Notes (Signed)
RMSF and lyme tests negative, PSA ,0.1 lp

## 2020-11-06 ENCOUNTER — Other Ambulatory Visit: Payer: Self-pay | Admitting: Family Medicine

## 2020-11-06 DIAGNOSIS — I25110 Atherosclerotic heart disease of native coronary artery with unstable angina pectoris: Secondary | ICD-10-CM

## 2020-11-09 ENCOUNTER — Other Ambulatory Visit: Payer: Self-pay | Admitting: Cardiovascular Disease

## 2020-11-16 LAB — COMPREHENSIVE METABOLIC PANEL
ALT: 23 IU/L (ref 0–44)
AST: 16 IU/L (ref 0–40)
Albumin/Globulin Ratio: 2 (ref 1.2–2.2)
Albumin: 4.5 g/dL (ref 3.8–4.8)
Alkaline Phosphatase: 75 IU/L (ref 44–121)
BUN/Creatinine Ratio: 11 (ref 10–24)
BUN: 12 mg/dL (ref 8–27)
Bilirubin Total: 0.4 mg/dL (ref 0.0–1.2)
CO2: 23 mmol/L (ref 20–29)
Calcium: 9.4 mg/dL (ref 8.6–10.2)
Chloride: 104 mmol/L (ref 96–106)
Creatinine, Ser: 1.12 mg/dL (ref 0.76–1.27)
Globulin, Total: 2.2 g/dL (ref 1.5–4.5)
Glucose: 92 mg/dL (ref 65–99)
Potassium: 4.9 mmol/L (ref 3.5–5.2)
Sodium: 141 mmol/L (ref 134–144)
Total Protein: 6.7 g/dL (ref 6.0–8.5)
eGFR: 72 mL/min/{1.73_m2} (ref >59)

## 2020-11-16 LAB — LIPID PANEL
Chol/HDL Ratio: 4.4 ratio (ref 0.0–5.0)
Cholesterol, Total: 181 mg/dL (ref 100–199)
HDL: 41 mg/dL (ref >39)
LDL Chol Calc (NIH): 114 mg/dL — ABNORMAL HIGH (ref 0–99)
Triglycerides: 145 mg/dL (ref 0–149)
VLDL Cholesterol Cal: 26 mg/dL (ref 5–40)

## 2020-11-20 ENCOUNTER — Other Ambulatory Visit: Payer: Self-pay

## 2020-11-20 ENCOUNTER — Other Ambulatory Visit: Payer: Self-pay | Admitting: Legal Medicine

## 2020-11-20 ENCOUNTER — Encounter: Payer: Self-pay | Admitting: Legal Medicine

## 2020-11-20 ENCOUNTER — Ambulatory Visit (INDEPENDENT_AMBULATORY_CARE_PROVIDER_SITE_OTHER): Payer: Managed Care, Other (non HMO) | Admitting: Legal Medicine

## 2020-11-20 VITALS — BP 142/80 | HR 71 | Temp 98.1°F | Ht 71.0 in | Wt 224.0 lb

## 2020-11-20 DIAGNOSIS — M11211 Other chondrocalcinosis, right shoulder: Secondary | ICD-10-CM

## 2020-11-20 DIAGNOSIS — L03312 Cellulitis of back [any part except buttock]: Secondary | ICD-10-CM

## 2020-11-20 MED ORDER — DOXYCYCLINE HYCLATE 100 MG PO TABS: 100.0000 mg | ORAL_TABLET | Freq: Two times a day (BID) | ORAL | 0 refills | Status: DC

## 2020-11-20 MED ORDER — MUPIROCIN CALCIUM 2 % EX CREA: 1.0000 "application " | TOPICAL_CREAM | Freq: Two times a day (BID) | CUTANEOUS | 0 refills | Status: DC

## 2020-11-20 MED ORDER — OXYCODONE-ACETAMINOPHEN 10-325 MG PO TABS
1.0000 | ORAL_TABLET | Freq: Three times a day (TID) | ORAL | 0 refills | Status: AC | PRN
Start: 2020-11-20 — End: 2020-11-25

## 2020-11-20 NOTE — Progress Notes (Signed)
Acute Office Visit  Subjective:    Patient ID: Jacob Collier, male    DOB: 1955-09-18, 65 y.o.   MRN: [id number]  Chief Complaint  Patient presents with   Insect Bite    On patient's back, not hauling, itchy, no pain.    HPI Patient is in today for infection back for 3 weeks.  It is tender. Red and small.  No purulence.  Redness surrounding bite area.  He is having recurrent right shoulder pain with his shoulder arthropathy.  He refuses surgery  Past Medical History:  Diagnosis Date   Arthritis    Coronary artery disease    2 stents   Diverticulosis    Hard of hearing    Hypertension    hx of elevation on lyrica, off med and now normal    Wears glasses     Past Surgical History:  Procedure Laterality Date   CARDIAC CATHETERIZATION     with 2 stents    COLECTOMY  05/2018   EYE SURGERY  02/2020   bilaterl cataracts   PROSTATECTOMY     TOTAL HIP ARTHROPLASTY Left 11/10/2015   Procedure: LEFT TOTAL HIP ARTHROPLASTY ANTERIOR APPROACH;  Surgeon: Mcarthur Rossetti, MD;  Location: [facility];  Service: Orthopedics;  Laterality: Left;    Family History  Problem Relation Age of Onset   Alzheimer's disease Mother    Stroke Father    Diabetes Sister    Heart attack Maternal Grandmother    Cancer Maternal Uncle        prostate   Prostate cancer Maternal Uncle    Cancer Maternal Uncle        prostate   Prostate cancer Maternal Uncle    Colon polyps Brother    Colon cancer Neg Hx    Esophageal cancer Neg Hx    Rectal cancer Neg Hx    Stomach cancer Neg Hx     Social History   Socioeconomic History   Marital status: Married    Spouse name: Not on file   Number of children: 0   Years of education: Not on file   Highest education level: Not on file  Occupational History   Occupation: Employed at [employer]  Tobacco Use   Smoking status: Never   Smokeless tobacco: Never  Vaping Use   Vaping Use: Former  Substance and Sexual Activity   Alcohol use: Not  Currently    Alcohol/week: 0.0 standard drinks    Comment: rarely   Drug use: No   Sexual activity: Yes    Partners: Female    Birth control/protection: None  Other Topics Concern   Not on file  Social History Narrative   Not on file   Social Determinants of Health   Financial Resource Strain: Not on file  Food Insecurity: Not on file  Transportation Needs: Not on file  Physical Activity: Not on file  Stress: Not on file  Social Connections: Not on file  Intimate Partner Violence: Not on file    Outpatient Medications Prior to Visit  Medication Sig Dispense Refill   amLODipine (NORVASC) 2.5 MG tablet TAKE 1 TABLET BY MOUTH EVERY DAY (Patient taking differently: Take 2 2.5 mg for 5 mg total.) 90 tablet 3   aspirin 81 MG EC tablet Take 81 mg by mouth daily.      clonazePAM (KLONOPIN) 0.5 MG tablet TAKE 1 TABLET (0.5 MG TOTAL) BY MOUTH 3 (THREE) TIMES DAILY AS NEEDED FOR ANXIETY. 90 tablet 1  clopidogrel (PLAVIX) 75 MG tablet TAKE 1 TABLET BY MOUTH EVERY DAY 90 tablet 1   Eszopiclone 3 MG TABS TAKE 1 TABLET BY MOUTH AT BEDTIME. 30 tablet 2   ezetimibe (ZETIA) 10 MG tablet TAKE 1 TABLET BY MOUTH EVERY DAY 90 tablet 3   isosorbide mononitrate (IMDUR) 30 MG 24 hr tablet Take 30 mg in the morning, and 15 mg in the evening. (Patient taking differently: Take 34m in the morning, and 15 mg in the evening.) 180 tablet 1   losartan (COZAAR) 25 MG tablet TAKE 1 TABLET BY MOUTH EVERY DAY (Patient taking differently: Takes 1/2 tablet) 90 tablet 3   metoprolol succinate (TOPROL XL) 25 MG 24 hr tablet Take 1/2 tablet (12.550m every other day alternating with full tablet (2526mthe other days. 90 tablet 3   Omega-3 Fatty Acids (FISH OIL) 1000 MG CAPS Take 1,000 mg by mouth daily.      pravastatin (PRAVACHOL) 20 MG tablet TAKE 1 TABLET BY MOUTH EVERY DAY IN THE EVENING 90 tablet 3   predniSONE (STERAPRED UNI-PAK 21 TAB) 10 MG (21) TBPK tablet Take 6ills first day , then 5 pills day 2 and then cut  down one pill day until gone 21 tablet 0   celecoxib (CELEBREX) 200 MG capsule Take by mouth 2 (two) times daily.     nitroGLYCERIN (NITROSTAT) 0.4 MG SL tablet Place 1 tablet (0.4 mg total) under the tongue every 5 (five) minutes as needed for chest pain. 25 tablet 2   No facility-administered medications prior to visit.    Allergies  Allergen Reactions   Ranolazine     Other reaction(s): Other (See Comments) Chest discomfort/Reflux   Statins Other (See Comments)    myalgias    Review of Systems  Constitutional:  Negative for chills, fatigue and fever.  HENT:  Negative for congestion, ear pain and sore throat.   Respiratory:  Negative for cough and shortness of breath.   Cardiovascular:  Negative for chest pain.  Gastrointestinal:  Negative for abdominal pain, constipation, diarrhea, nausea and vomiting.  Endocrine: Negative for polydipsia, polyphagia and polyuria.  Genitourinary:  Negative for dysuria and frequency.  Musculoskeletal:  Negative for arthralgias and myalgias.  Neurological:  Negative for dizziness and headaches.  Psychiatric/Behavioral:  Negative for dysphoric mood.        No dysphoria      Objective:    Physical Exam Vitals reviewed.  Constitutional:      Appearance: Normal appearance.  HENT:     Head: Normocephalic.     Right Ear: Tympanic membrane normal.     Left Ear: Tympanic membrane normal.     Mouth/Throat:     Mouth: Mucous membranes are moist.     Pharynx: Oropharynx is clear.  Eyes:     Extraocular Movements: Extraocular movements intact.     Conjunctiva/sclera: Conjunctivae normal.     Pupils: Pupils are equal, round, and reactive to light.  Cardiovascular:     Rate and Rhythm: Normal rate and regular rhythm.     Pulses: Normal pulses.     Heart sounds: No murmur heard.   No gallop.  Pulmonary:     Effort: Pulmonary effort is normal. No respiratory distress.     Breath sounds: Normal breath sounds. No wheezing.  Skin:    General:  Skin is warm.     Capillary Refill: Capillary refill takes less than 2 seconds.     Findings: Rash present.     Comments: 1cm redness  right scapular area.  Itching, no paain  Neurological:     Mental Status: He is alert.    BP (!) 142/80   Pulse 71   Temp 98.1 F (36.7 C)   Ht _0  (1.803 m)   Wt 224 lb (101.6 kg)   SpO2 96%   BMI 31.24 kg/m  Wt Readings from Last 3 Encounters:  11/20/20 224 lb (101.6 kg)  10/26/20 220 lb 6.4 oz (100 kg)  09/21/20 212 lb (96.2 kg)    Health Maintenance Due  Topic Date Due   Hepatitis C Screening  Never done   TETANUS/TDAP  Never done   Zoster Vaccines- Shingrix (1 of 2) Never done   COVID-19 Vaccine (2 - Janssen risk series) 08/07/2019   INFLUENZA VACCINE  10/05/2020    There are no preventive care reminders to display for this patient.   No results found for: TSH Lab Results  Component Value Date   WBC 7.1 09/09/2020   HGB 18.2 (H) 09/09/2020   HCT 52.4 (H) 09/09/2020   MCV 87.9 09/09/2020   PLT 163 09/09/2020   Lab Results  Component Value Date   NA 141 11/16/2020   K 4.9 11/16/2020   CO2 23 11/16/2020   GLUCOSE 92 11/16/2020   BUN 12 11/16/2020   CREATININE 1.12 11/16/2020   BILITOT 0.4 11/16/2020   ALKPHOS 75 11/16/2020   AST 16 11/16/2020   ALT 23 11/16/2020   PROT 6.7 11/16/2020   ALBUMIN 4.5 11/16/2020   CALCIUM 9.4 11/16/2020   ANIONGAP 9 09/09/2020   EGFR 72 11/16/2020   Lab Results  Component Value Date   CHOL 181 11/16/2020   Lab Results  Component Value Date   HDL 41 11/16/2020   Lab Results  Component Value Date   LDLCALC 114 (H) 11/16/2020   Lab Results  Component Value Date   TRIG 145 11/16/2020   Lab Results  Component Value Date   CHOLHDL 4.4 11/16/2020   Lab Results  Component Value Date   HGBA1C 5.8 (H) 11/28/2019       Assessment & Plan:  1. Cellulitis of skin of back - doxycycline (VIBRA-TABS) 100 MG tablet; Take 1 tablet (100 mg total) by mouth 2 (two) times daily.   Dispense: 20 tablet; Refill: 0 Small area of cellulitis on right upper back,treat with antibiotics and bactroban  2. Arthropathy, chondrocalcinosis, shoulder, right - oxyCODONE-acetaminophen (PERCOCET) 10-325 MG tablet; Take 1 tablet by mouth every 8 (eight) hours as needed for up to 5 days for pain.  Dispense: 15 tablet; Refill: 0  Re- inject right shoulder with relief of pain  Meds ordered this encounter  Medications   doxycycline (VIBRA-TABS) 100 MG tablet    Sig: Take 1 tablet (100 mg total) by mouth 2 (two) times daily.    Dispense:  20 tablet    Refill:  0   DISCONTD: mupirocin cream (BACTROBAN) 2 %    Sig: Apply 1 application topically 2 (two) times daily.    Dispense:  15 g    Refill:  0   oxyCODONE-acetaminophen (PERCOCET) 10-325 MG tablet    Sig: Take 1 tablet by mouth every 8 (eight) hours as needed for up to 5 days for pain.    Dispense:  15 tablet    Refill:  0        I spent 30 minutes dedicated to the care of this patient on the date of this encounter to include face-to-face time with the patient, as  well as:   Follow-up: Return in about 2 weeks (around 12/04/2020), or if symptoms worsen or fail to improve.  An After Visit Summary was printed and given to the patient.  Reinaldo Meeker, MD [facility] [phone]

## 2020-11-23 ENCOUNTER — Other Ambulatory Visit: Payer: Self-pay | Admitting: Legal Medicine

## 2020-11-23 DIAGNOSIS — F5101 Primary insomnia: Secondary | ICD-10-CM

## 2020-11-25 ENCOUNTER — Other Ambulatory Visit: Payer: Self-pay

## 2020-11-25 MED ORDER — CEPHALEXIN 500 MG PO CAPS: 500.0000 mg | ORAL_CAPSULE | Freq: Two times a day (BID) | ORAL | 0 refills | Status: DC

## 2020-11-26 ENCOUNTER — Other Ambulatory Visit: Payer: Self-pay

## 2020-11-26 ENCOUNTER — Ambulatory Visit (INDEPENDENT_AMBULATORY_CARE_PROVIDER_SITE_OTHER): Payer: Managed Care, Other (non HMO) | Admitting: Legal Medicine

## 2020-11-26 ENCOUNTER — Encounter: Payer: Self-pay | Admitting: Legal Medicine

## 2020-11-26 VITALS — BP 124/80 | HR 74 | Temp 97.7°F | Resp 18 | Ht 71.0 in | Wt 227.0 lb

## 2020-11-26 DIAGNOSIS — I25110 Atherosclerotic heart disease of native coronary artery with unstable angina pectoris: Secondary | ICD-10-CM | POA: Diagnosis not present

## 2020-11-26 DIAGNOSIS — I471 Supraventricular tachycardia, unspecified: Secondary | ICD-10-CM | POA: Insufficient documentation

## 2020-11-26 NOTE — Progress Notes (Signed)
Acute Office Visit  Subjective:    Patient ID: Jacob Collier, male    DOB: 17-Jun-1955, 65 y.o.   MRN: [id number]  Chief Complaint  Patient presents with   Tachycardia   Insect Bite   Gastroesophageal Reflux    HPI Patient is in today for tachycardia driving home on 4/58/09.  Sudden onset tachycardia.  He was flushed and dizzy driving.  Chest pain 25 minutes relieved by 1 nitro at home.  The tachycardia resolved, bp 118/68.  He is also complaining of gerd. He is on 4m metoprolol alternating with 12.5.  he is off his statin.  He had an event monitor 2021 showing supraventicular tachycardia.  Past Medical History:  Diagnosis Date   Arthritis    Coronary artery disease    2 stents   Diverticulosis    Hard of hearing    Hypertension    hx of elevation on lyrica, off med and now normal    Wears glasses     Past Surgical History:  Procedure Laterality Date   CARDIAC CATHETERIZATION     with 2 stents    COLECTOMY  05/2018   EYE SURGERY  02/2020   bilaterl cataracts   PROSTATECTOMY     TOTAL HIP ARTHROPLASTY Left 11/10/2015   Procedure: LEFT TOTAL HIP ARTHROPLASTY ANTERIOR APPROACH;  Surgeon: CMcarthur Rossetti MD;  [facility]  Service: Orthopedics;  Laterality: Left;    Family History  Problem Relation Age of Onset   Alzheimer's disease Mother    Stroke Father    Diabetes Sister    Heart attack Maternal Grandmother    Cancer Maternal Uncle        prostate   Prostate cancer Maternal Uncle    Cancer Maternal Uncle        prostate   Prostate cancer Maternal Uncle    Colon polyps Brother    Colon cancer Neg Hx    Esophageal cancer Neg Hx    Rectal cancer Neg Hx    Stomach cancer Neg Hx     Social History   Socioeconomic History   Marital status: Married    Spouse name: Not on file   Number of children: 0   Years of education: Not on file   Highest education level: Not on file  Occupational History   Occupation: Employed at [employer]   Tobacco Use   Smoking status: Never   Smokeless tobacco: Never  Vaping Use   Vaping Use: Former  Substance and Sexual Activity   Alcohol use: Not Currently    Alcohol/week: 0.0 standard drinks    Comment: rarely   Drug use: No   Sexual activity: Yes    Partners: Female    Birth control/protection: None  Other Topics Concern   Not on file  Social History Narrative   Not on file   Social Determinants of Health   Financial Resource Strain: Not on file  Food Insecurity: Not on file  Transportation Needs: Not on file  Physical Activity: Not on file  Stress: Not on file  Social Connections: Not on file  Intimate Partner Violence: Not on file    Outpatient Medications Prior to Visit  Medication Sig Dispense Refill   amLODipine (NORVASC) 2.5 MG tablet TAKE 1 TABLET BY MOUTH EVERY DAY (Patient taking differently: Take 2 2.5 mg for 5 mg total.) 90 tablet 3   aspirin 81 MG EC tablet Take 81 mg by mouth daily.      cephALEXin (  KEFLEX) 500 MG capsule Take 1 capsule (500 mg total) by mouth 2 (two) times daily. 14 capsule 0   clonazePAM (KLONOPIN) 0.5 MG tablet TAKE 1 TABLET (0.5 MG TOTAL) BY MOUTH 3 (THREE) TIMES DAILY AS NEEDED FOR ANXIETY. 90 tablet 1   clopidogrel (PLAVIX) 75 MG tablet TAKE 1 TABLET BY MOUTH EVERY DAY 90 tablet 1   Eszopiclone 3 MG TABS TAKE 1 TABLET BY MOUTH EVERYDAY AT BEDTIME 30 tablet 2   ezetimibe (ZETIA) 10 MG tablet TAKE 1 TABLET BY MOUTH EVERY DAY 90 tablet 3   isosorbide mononitrate (IMDUR) 30 MG 24 hr tablet Take 30 mg in the morning, and 15 mg in the evening. (Patient taking differently: Take 44m in the morning, and 15 mg in the evening.) 180 tablet 1   losartan (COZAAR) 25 MG tablet TAKE 1 TABLET BY MOUTH EVERY DAY (Patient taking differently: Takes 1/2 tablet) 90 tablet 3   metoprolol succinate (TOPROL XL) 25 MG 24 hr tablet Take 1/2 tablet (12.560m every other day alternating with full tablet (2529mthe other days. 90 tablet 3   mupirocin ointment  (BACTROBAN) 2 % Apply topically 2 (two) times daily. 15 g 0   Omega-3 Fatty Acids (FISH OIL) 1000 MG CAPS Take 1,000 mg by mouth daily.      pravastatin (PRAVACHOL) 20 MG tablet TAKE 1 TABLET BY MOUTH EVERY DAY IN THE EVENING 90 tablet 3   predniSONE (STERAPRED UNI-PAK 21 TAB) 10 MG (21) TBPK tablet Take 6ills first day , then 5 pills day 2 and then cut down one pill day until gone 21 tablet 0   nitroGLYCERIN (NITROSTAT) 0.4 MG SL tablet Place 1 tablet (0.4 mg total) under the tongue every 5 (five) minutes as needed for chest pain. 25 tablet 2   No facility-administered medications prior to visit.    Allergies  Allergen Reactions   Ranolazine     Other reaction(s): Other (See Comments) Chest discomfort/Reflux   Statins Other (See Comments)    myalgias    Review of Systems  Constitutional:  Negative for chills, fatigue and fever.  HENT:  Negative for congestion, ear pain and sore throat.   Respiratory:  Negative for cough and shortness of breath.   Cardiovascular:  Positive for chest pain.  Gastrointestinal:  Negative for abdominal pain, constipation, diarrhea, nausea and vomiting.  Endocrine: Negative for polydipsia, polyphagia and polyuria.  Genitourinary:  Negative for dysuria and frequency.  Musculoskeletal:  Negative for arthralgias and myalgias.  Skin: Negative.   Neurological:  Negative for dizziness and headaches.  Psychiatric/Behavioral:  Negative for dysphoric mood.        No dysphoria      Objective:    Physical Exam Vitals reviewed.  Constitutional:      General: He is not in acute distress.    Appearance: Normal appearance.  HENT:     Head: Normocephalic.     Right Ear: Tympanic membrane, ear canal and external ear normal.     Left Ear: Tympanic membrane, ear canal and external ear normal.     Mouth/Throat:     Mouth: Mucous membranes are moist.     Pharynx: Oropharynx is clear.  Eyes:     Extraocular Movements: Extraocular movements intact.      Conjunctiva/sclera: Conjunctivae normal.     Pupils: Pupils are equal, round, and reactive to light.  Cardiovascular:     Rate and Rhythm: Normal rate and regular rhythm.     Pulses: Normal pulses.  Heart sounds: Normal heart sounds. No murmur heard.   No gallop.  Pulmonary:     Effort: Pulmonary effort is normal. No respiratory distress.     Breath sounds: Normal breath sounds. No wheezing.  Abdominal:     General: Abdomen is flat. Bowel sounds are normal. There is no distension.     Palpations: Abdomen is soft.     Tenderness: There is no abdominal tenderness.  Musculoskeletal:        General: Normal range of motion.     Cervical back: Normal range of motion and neck supple.  Skin:    General: Skin is warm.     Capillary Refill: Capillary refill takes less than 2 seconds.  Neurological:     General: No focal deficit present.     Mental Status: He is alert and oriented to person, place, and time. Mental status is at baseline.  Psychiatric:        Mood and Affect: Mood normal.        Thought Content: Thought content normal.    BP 124/80   Pulse 74   Temp 97.7 F (36.5 C)   Resp 18   Ht 5' 11"  (1.803 m)   Wt 227 lb (103 kg)   SpO2 94%   BMI 31.66 kg/m  Wt Readings from Last 3 Encounters:  11/26/20 227 lb (103 kg)  11/20/20 224 lb (101.6 kg)  10/26/20 220 lb 6.4 oz (100 kg)    Health Maintenance Due  Topic Date Due   Hepatitis C Screening  Never done   TETANUS/TDAP  Never done   Zoster Vaccines- Shingrix (1 of 2) Never done   COVID-19 Vaccine (2 - Janssen risk series) 08/07/2019   INFLUENZA VACCINE  10/05/2020    There are no preventive care reminders to display for this patient.   No results found for: TSH Lab Results  Component Value Date   WBC 7.1 09/09/2020   HGB 18.2 (H) 09/09/2020   HCT 52.4 (H) 09/09/2020   MCV 87.9 09/09/2020   PLT 163 09/09/2020   Lab Results  Component Value Date   NA 141 11/16/2020   K 4.9 11/16/2020   CO2 23 11/16/2020    GLUCOSE 92 11/16/2020   BUN 12 11/16/2020   CREATININE 1.12 11/16/2020   BILITOT 0.4 11/16/2020   ALKPHOS 75 11/16/2020   AST 16 11/16/2020   ALT 23 11/16/2020   PROT 6.7 11/16/2020   ALBUMIN 4.5 11/16/2020   CALCIUM 9.4 11/16/2020   ANIONGAP 9 09/09/2020   EGFR 72 11/16/2020   Lab Results  Component Value Date   CHOL 181 11/16/2020   Lab Results  Component Value Date   HDL 41 11/16/2020   Lab Results  Component Value Date   LDLCALC 114 (H) 11/16/2020   Lab Results  Component Value Date   TRIG 145 11/16/2020   Lab Results  Component Value Date   CHOLHDL 4.4 11/16/2020   Lab Results  Component Value Date   HGBA1C 5.8 (H) 11/28/2019       Assessment & Plan:  Diagnoses and all orders for this visit: Unstable angina pectoris due to coronary arteriosclerosis ([place]) -     Troponin T, STAT ([facility]) Patient appears to have had an anginal episode with his tachycardia in his car.  He has not had any chest pain last 6 months but recently cut his metoprolol to 12-1/2 mg alternating with 25 mg at night I recommended he again increase to 25 mg every  night to see if we get better rate control.  The EKG has shown no acute changes and is remains in normal sinus rhythm Paroxysmal supraventricular tachycardia ([facility]) -     EKG 12-Lead  Patient has a history of paroxysmal supraventricular Tachycardia found on event monitor in 2021 which is probably the rhythm that he was in while he was in the car.  I did instruct him he should have called EMS to see him rather than drive home.  We will increase his metoprolol to 25 mg every night and I will send a copy of this to Dr. [name] to see if he may want to see him before January.     I spent 20 minutes dedicated to the care of this patient on the date of this encounter to include face-to-face time with the patient, as well as:   Follow-up: Return as scheduled, for shoulder.  An After Visit Summary was printed and given to the  patient.  Reinaldo Meeker, MD [facility] [phone]

## 2020-11-26 NOTE — Assessment & Plan Note (Signed)
Seen on event monitor 6.2021

## 2020-11-27 ENCOUNTER — Telehealth: Payer: Self-pay | Admitting: Cardiovascular Disease

## 2020-11-27 LAB — TROPONIN T: Troponin T (Highly Sensitive): 17 ng/L (ref 0–22)

## 2020-11-27 NOTE — Telephone Encounter (Signed)
Spoke to patient he stated he has had 3 episodes of chest pain in the past 2 weeks.Stated he was bit by a bug on his back 2 weeks ago he saw PCP who prescribed antibiotics and he had a cortisone shot.Stated 2 days later he had first episode of chest pain,fast heart beat and pain both sides of neck.Stated each episode he took NTG,Aspirin,Metoprolol 12.5 mg with relief.He saw PCP yesterday,but wanted to see Dr.Kelly.Dr.Kelly's schedule is full.Appointment scheduled with Sande Rives PA 10/5 at 2:45 pm.Advised to go to ED if he has any more chest pain.

## 2020-11-27 NOTE — Telephone Encounter (Signed)
   Pt c/o of Chest Pain: STAT if CP now or developed within 24 hours  1. Are you having CP right now? No  2. Are you experiencing any other symptoms (ex. SOB, nausea, vomiting, sweating)?   3. How long have you been experiencing CP? Couple days ago  4. Is your CP continuous or coming and going? Coming and going  5. Have you taken Nitroglycerin? Yes  ?  Pt said he's been experiencing CP since last week, the episode gets for often now, last night he had 3 episodes. Taking nitroglycerin, aspirin and metoprolol helps with the pain and HR. HR been fluctuating HR 189, 160 but BP is normal

## 2020-11-29 NOTE — Progress Notes (Signed)
Troponin T 17 no evidence for cardiac damage lp

## 2020-12-01 ENCOUNTER — Telehealth: Payer: Self-pay

## 2020-12-01 NOTE — Telephone Encounter (Signed)
I left a message on the number(s) listed in the patients chart requesting the patient to call back regarding the [facility] appointment for 12/03/2020. The provider is out of the office that day. The appointment has been canceled. Waiting for the patient to return the call. Appointment will need to be rescheduled with Dr. Henrene Pastor.

## 2020-12-03 ENCOUNTER — Ambulatory Visit: Payer: Managed Care, Other (non HMO) | Admitting: Legal Medicine

## 2020-12-05 ENCOUNTER — Other Ambulatory Visit: Payer: Self-pay | Admitting: Cardiovascular Disease

## 2020-12-06 NOTE — Progress Notes (Signed)
Cardiology Office Note:    Date:  12/09/2020   ID:  Jacob Collier, DOB September 13, 1955, MRN [id number]  PCP:  [name], MD  Cardiologist:  [name], MD  Electrophysiologist:  None   Referring MD: [name],*   Chief Complaint: chest pain and palpitations  History of Present Illness:    Jacob Collier is a 65 y.o. male with a history of CAD s/p DES to RCA in 01/2019 with in-stent CTO of RCA and CTO of LCX on cath in 05/2019, palpitations with PVCs and paroxysmal SVT on monitor in 07/2019, hypertension, hyperlipidemia, anxiety, and prior prostate cancer who is followed by Dr. [name] and presents today for further evaluation of chest pain and palpitations.   Patient has a known history of CAD and was previously followed by Cardiology at [facility] and now follows with Dr. [name]. Reviewed records in [place]. Cardiac catheterization in 01/2019 at [facility] showed CTO of LCX and RCA. Patient underwent successful PCI with DES to proximal to mid RCA lesion. Zio monitor was ordered in 02/2019 which showed frequent PVC (8.5% burden) and non-sustained VT. Myoview was therefore ordered to rule out ischemia as the cause. This was done in 05/2019 showed inferolateral wall defect with associated wall motion abnormality compatible with prior MI. He was started on Ranexa but was unable to tolerate this. Patient presented to [facility] later that month with chest pain. Echo showed LVEF of 55-60% with akinesis of the basal inferior and inferoseptal wall and hypokinesis of inferolateral walls. He underwent repeat cardiac catheterization which showed instent CTO of RCA. Medical therapy was recommended with consideration for CABG if unsatisfactory response with optimization of antianginals.  He presented to [facility] ED in 06/2019 with palpitations and tightness in his neck and was found to be in SVT with rates in the 200s but converted back to sinus rhythm on his own. He  was started on Toprol-XL and Event Monitor was ordered which showed underlying sinus rhythm with average heart rate of 65 bpm and occasional PVCs/ventricular couplets, one 4 beat episodes of non-sustained VT, and a 36 second episode of SVT with rates in the 170s. There were no pauses or evidence of atrial fibrillation. Toprol-XL was increased.  Last Echo in 07/2020 showed LVEF of 50-55% with hypokinesis of the antero-lateral wall, posterior wall, and basal inferior segment, grade 2 diastolic dysfunction, mildly enlarged RV with normal systolic function, mild MR, and mild dilatation of the ascending aorta measuring 9mm.  He presented to the [facility] ED in 07/2020 with chest pain requiring 2 doses of Nitroglycerin. EKG showed no acute ischemic changes and high-sensitivity troponin was negative. Imdur was increased and he was discharge. He was seen by Dr. [name] for follow-up in 08/2020 and denied any recurrent chest pain.  Patient called our office on 11/27/2020 with reports of chest pain. Therefore,this visit was scheduled for further evaluation.  He is here with his wife.  He reports recurrent chest pain over the last couple months but is similar to his prior pain and is becoming more frequent.  He states it feels like a squeezing/swishing sensation on the left side of his chest.  It improves with Nitro.  He has required multiple doses of Nitro recently and is been missing work several days a week due to the pain.  He also reports some chronic shortness of breath with exertion which is worse than usual.  No shortness of breath at rest.  No orthopnea or  PND.  No lower extremity edema on exam.  He continues to have occasional palpitations but overall well controlled with beta-blocker and as needed Klonopin.  During one episode of tachycardia he had chest pain with this and BP spiked.  However normally chest pain is associated with tachycardia.  He has self adjusted his BP medications due to the dizziness when standing  and feeling like a "zombie" when his BP is on the softer side.  He decreased his Imdur from 30 mg in the morning and 50 mg in the evening to 50 mg twice a day.  He also decreased his Toprol XL to 12.5 mg daily.  He is also on Amlodipine 2.5 mg daily and Losartan 25 mg daily.  Since making these changes, dizziness has improved and BP is still well controlled.  He denies any syncope.  He also has stopped taking Pravastatin due to myalgias and is interested in trying Repatha.  Past Medical History:  Diagnosis Date   Arthritis    Coronary artery disease    s/p prior stenting to RCA. Last cath in 05/2019 showed in-stent CTO of RCA and CTO of LCX (medical therpay recommended)   Diverticulosis    Hard of hearing    Hyperlipidemia    Hypertension    hx of elevation on lyrica, off med and now normal    Paroxysmal SVT (supraventricular tachycardia) ([place])    Prostate cancer ([place])    Wears glasses     Past Surgical History:  Procedure Laterality Date   CARDIAC CATHETERIZATION     with 2 stents    COLECTOMY  05/2018   EYE SURGERY  02/2020   bilaterl cataracts   PROSTATECTOMY     TOTAL HIP ARTHROPLASTY Left 11/10/2015   Procedure: LEFT TOTAL HIP ARTHROPLASTY ANTERIOR APPROACH;  Surgeon: Mcarthur Rossetti, MD;  Location: [place];  Service: Orthopedics;  Laterality: Left;    Current Medications: Current Meds  Medication Sig   amLODipine (NORVASC) 2.5 MG tablet TAKE 1 TABLET BY MOUTH EVERY DAY (Patient taking differently: Take 2 2.5 mg for 5 mg total.)   aspirin 81 MG EC tablet Take 81 mg by mouth daily.    clonazePAM (KLONOPIN) 0.5 MG tablet TAKE 1 TABLET (0.5 MG TOTAL) BY MOUTH 3 (THREE) TIMES DAILY AS NEEDED FOR ANXIETY.   clopidogrel (PLAVIX) 75 MG tablet TAKE 1 TABLET BY MOUTH EVERY DAY   Eszopiclone 3 MG TABS TAKE 1 TABLET BY MOUTH EVERYDAY AT BEDTIME   ezetimibe (ZETIA) 10 MG tablet TAKE 1 TABLET BY MOUTH EVERY DAY   isosorbide mononitrate (IMDUR) 30 MG 24 hr tablet Take 30 mg in the  morning, and 15 mg in the evening. (Patient taking differently: Take 15mg  in the morning, and 15 mg in the evening.)   metoprolol succinate (TOPROL XL) 25 MG 24 hr tablet Take 1/2 tablet (12.5mg ) every other day alternating with full tablet (25mg ) the other days.   Omega-3 Fatty Acids (FISH OIL) 1000 MG CAPS Take 1,000 mg by mouth daily.    [DISCONTINUED] losartan (COZAAR) 25 MG tablet TAKE 1 TABLET BY MOUTH EVERY DAY (Patient taking differently: Takes 1/2 tablet)     Allergies:   Ranolazine and Statins   Social History   Socioeconomic History   Marital status: Married    Spouse name: Not on file   Number of children: 0   Years of education: Not on file   Highest education level: Not on file  Occupational History   Occupation: Employed at  [employer]  Tobacco Use   Smoking status: Never   Smokeless tobacco: Never  Vaping Use   Vaping Use: Former  Substance and Sexual Activity   Alcohol use: Not Currently    Alcohol/week: 0.0 standard drinks    Comment: rarely   Drug use: No   Sexual activity: Yes    Partners: Female    Birth control/protection: None  Other Topics Concern   Not on file  Social History Narrative   Not on file   Social Determinants of Health   Financial Resource Strain: Not on file  Food Insecurity: Not on file  Transportation Needs: Not on file  Physical Activity: Not on file  Stress: Not on file  Social Connections: Not on file     Family History: The patient's family history includes Alzheimer's disease in his mother; Cancer in his maternal uncle and maternal uncle; Colon polyps in his brother; Diabetes in his sister; Heart attack in his maternal grandmother; Prostate cancer in his maternal uncle and maternal uncle; Stroke in his father. There is no history of Colon cancer, Esophageal cancer, Rectal cancer, or Stomach cancer.  ROS:   Please see the history of present illness.     EKGs/Labs/Other Studies Reviewed:    The following studies were  reviewed today:  Cardiac Catheterization 01/06/2019 [facility]): Summary: - RCA and Circ CTO's Double vessel CAD - Normal LV function (echo) - Successful PCI / Promus Drug Eluting Stent 3.0x24 +16 for prox and mid RCA CTO segments.  Interventional Recommendations: medical therapy for complex circ CTO. PCI attempt if clinically indicated post PCI routine; DAPT x 12 months _______________   Echocardiogram 05/22/2019 [facility]): Summary: There is mild concentric left ventricular hypertrophy with basal inferior and inferoseptal akinesis and mild hypokinesis of the inferolateral hypokinesis. Left ventricular systolic function is overall normal with ejection fraction = 55-60%. There is mild aorticvalve thickening and mild aortic regurgitation. The mitral valve leaflets appear normal and mild mitral regurgitation. RVSP not able to be calculated. There is no comparison study available. _______________   Cardiac Catheterization 05/25/2019 [facility]): 2 Vessel CAD/CTO's: - CTO of mid/distal circ, complex anatomy - Instent CTO of RCA - Inferobasal akinesis, other segments normal; LVEF 50%   Recommendations: Medical therapy for CAD/angina. If unsatisfactory response, will  consider CABG.  LAD remains widely patent. _______________  Event Monitor 07/15/2019 to 08/13/2019: The patient was monitored for 30 days from Jul 15, 2019 through August 13, 2019.  The predominant rhythm was sinus rhythm with an average rate at 65 bpm.  The slowest heart rate was sinus bradycardia at 42 bpm which occurred on May 16 at 10:40 AM.  The fastest heart rate was sinus tachycardia at 164 bpm which occurred on May 24 at 1:32 PM.  The patient had occasional PVCs with a ventricular couplet, as well as several episodes of ventricular bigeminal rhythm.  There was an episode of 4 beats of nonsustained ventricular tachycardia on Jul 18, 2019 at 6:58 PM at 192 bpm.  There was a 36-second episode of supraventricular  tachycardia with PACs at 174 bpm.  There were no pauses.  There was no episodes of atrial fibrillation. _______________  Echocardiogram 07/22/2020: Impressions: 1. Left ventricular ejection fraction, by estimation, is 50 to 55%. The  left ventricle has low normal function. The left ventricle demonstrates  regional wall motion abnormalities (see scoring diagram/findings for  description). There is mild eccentric  left ventricular hypertrophy of the septal segment. Left ventricular  diastolic parameters  are consistent with Grade II diastolic dysfunction  (pseudonormalization).   2. Right ventricular systolic function is normal. The right ventricular  size is mildly enlarged. There is normal pulmonary artery systolic  pressure. The estimated right ventricular systolic pressure is 81.1 mmHg.   3. Left atrial size was mildly dilated.   4. The mitral valve is normal in structure. Mild mitral valve  regurgitation. No evidence of mitral stenosis.   5. The aortic valve is grossly normal. There is mild calcification of the  aortic valve. Aortic valve regurgitation is not visualized. No aortic  stenosis is present.   6. Aortic dilatation noted. There is mild dilatation of the ascending  aorta, measuring 41 mm.   7. The inferior vena cava is normal in size with greater than 50%  respiratory variability, suggesting right atrial pressure of 3 mmHg.  LV Wall Scoring:  The antero-lateral wall, posterior wall, and basal inferior segment are  hypokinetic.   EKG:  EKG ordered today. EKG personally reviewed and demonstrates normal sinus rhythm, raet 61 bpm, with inferior Q waves but no acute ST/T changes. Normal axis. Normal PR and QRS intervals. QTc 444 ms.  Recent Labs: 09/09/2020: Hemoglobin 18.2; Platelet Count 163 11/16/2020: ALT 23; BUN 12; Creatinine, Ser 1.12; Potassium 4.9; Sodium 141  Recent Lipid Panel    Component Value Date/Time   CHOL 181 11/16/2020 1138   TRIG 145 11/16/2020 1138   HDL  41 11/16/2020 1138   CHOLHDL 4.4 11/16/2020 1138   CHOLHDL 4.8 06/06/2019 0612   VLDL 29 06/06/2019 0612   LDLCALC 114 (H) 11/16/2020 1138    Physical Exam:    Vital Signs: BP 119/70   Pulse 61   Ht 5\' 11"  (1.803 m)   Wt 228 lb 6.4 oz (103.6 kg)   SpO2 97%   BMI 31.86 kg/m     Wt Readings from Last 3 Encounters:  12/09/20 228 lb 6.4 oz (103.6 kg)  11/26/20 227 lb (103 kg)  11/20/20 224 lb (101.6 kg)     General: 65 y.o. Caucasian male in no acute distress. HEENT: Normocephalic and atraumatic. Sclera clear.  Neck: Supple. No carotid bruits. No JVD. Heart: RRR. Distinct S1 and S2. No murmurs, gallops, or rubs. Radial pulses 2+ and equal bilaterally. Lungs: No increased work of breathing. Clear to ausculation bilaterally. No wheezes, rhonchi, or rales.  Abdomen: Soft, non-distended, and non-tender to palpation.  MSK: Normal strength and tone for age.  Extremities: No lower extremity edema.    Skin: Warm and dry. Neuro: Alert and oriented x3. No focal deficits. Psych: Normal affect. Responds appropriately.  Assessment:    1. Chest pain of uncertain etiology   2. Coronary artery disease involving native coronary artery of native heart with unstable angina pectoris (HCC)   3. Paroxysmal SVT (supraventricular tachycardia) ([place])   4. Primary hypertension   5. Hyperlipidemia, unspecified hyperlipidemia type     Plan:    Chest Pain CAD with Chronic Angina - LHC in 01/2019 showed CTO of RCA and LCX. Underwent successful PCI with DES to RCA lesions at that time. Repeat LHC in 05/2019 showed in-stent CTO of RCA. Medical therapy recommended at that time. - Patient has had recurrent chest pain that is responsive to Nitro. - Continue DAPT with Aspirin and  Plavix. - Current antianginals include: Amlodipine 2.5mg  daily, Imdur 15mg  twice daily, and Toprol-XL 12.5mg  daily. Will stop Losartan and increase Imdur to 30mg  in the morning and 15mg  in the evening (will increase gradually due  to soft BP recently with associated dizziness). - Intolerant to multiple statins. Continue Zetia. - After discussion with patient and Dr. [name], decision was made to proceed with diagnostic cardiac catheterization. We do not expect that we will be able intervene on anything but patient would still like to redefine anatomy. The patient understands that risks include but are not limited to stroke (1 in 1000), death (1 in 1), kidney failure [usually temporary] (1 in 500), bleeding (1 in 200), allergic reaction [possibly serious] (1 in 200), and agrees to proceed.   Procedure is scheduled for 12/24/2020 with Dr. [name]. Will place pre-cath orders. Will check pre-cath (BMET and CBC) today.   Paroxsymal SVT History of Bradycardia - Monitor in May/June 2021 showed underlying sinus rhythm with average heart rate of 65 bpm and occasional PVCs/ventricular couplets, one 4 beat episodes of non-sustained VT, and a 36 second episode of SVT with rates in the 170s. He has also had some bradycardia with rates in the 40s in the past limiting up-titration of beta-blocker.  - Overall well controlled. - Continue Toprol-XL 12.5mg  daily.   Hypertension - BP well controlled in the office but he has had some soft readings at home with associated dizziness.  - Continue Amlodipine 2.5mg  daily. - Continue Toprol-XL 12.5mg  daily. - Stop Losartan and increase Imdur to 30mg  in the morning and 15mg  in the evening.    Hyperlipidemia - Recent lipid panel on 11/16/2020: Total Cholesterol 181, Triglycerides 145, HDL 41, LDL 114.  - LDL goal <70 given CAD.  - Intolerant to multiple statins in the past including Lipitor, Crestor, and Pravastatin. Continue Zetia 10mg  daily. Currently on Pravastatin 20mg  daily and Zetia 10mg  daily. - Will refer to lipid clinic for consideration of PCSK9 inhibitor.   Disposition: Follow up in in about 1 months after heart cath.   Medication Adjustments/Labs and Tests Ordered: Current medicines  are reviewed at length with the patient today.  Concerns regarding medicines are outlined above.  Orders Placed This Encounter  Procedures   Basic metabolic panel   CBC   AMB Referral to Advanced Lipid Disorders Clinic   EKG 12-Lead    No orders of the defined types were placed in this encounter.   Patient Instructions  Medication Instructions:  STOP Losartan  Increase Imdur to 30 mg in the morning and 15 mg in the evening.  *If you need a refill on your cardiac medications before your next appointment, please call your pharmacy*   Lab Work: BMET, CBC today   If you have labs (blood work) drawn today and your tests are completely normal, you will receive your results only by: [place] (if you have MyChart) OR A paper copy in the mail If you have any lab test that is abnormal or we need to change your treatment, we will call you to review the results.   Testing/Procedures: Your physician has requested that you have a cardiac catheterization. Cardiac catheterization is used to diagnose and/or treat various heart conditions. Doctors may recommend this procedure for a number of different reasons. The most common reason is to evaluate chest pain. Chest pain can be a symptom of coronary artery disease (CAD), and cardiac catheterization can show whether plaque is narrowing or blocking your heart's arteries. This procedure is also used to evaluate the valves, as well as measure the blood flow and oxygen levels in different parts of your heart. For further information please visit [email or web address].[email or web address]. Please follow instruction sheet, as given.   Follow-Up: At  [facility], you and your health needs are our priority.  As part of our continuing mission to provide you with exceptional heart care, we have created designated Provider Care Teams.  These Care Teams include your primary Cardiologist (physician) and Advanced Practice Providers (APPs -  Physician Assistants and Nurse  Practitioners) who all work together to provide you with the care you need, when you need it.  We recommend signing up for the patient portal called "MyChart".  Sign up information is provided on this After Visit Summary.  MyChart is used to connect with patients for Virtual Visits (Telemedicine).  Patients are able to view lab/test results, encounter notes, upcoming appointments, etc.  Non-urgent messages can be sent to your provider as well.   To learn more about what you can do with MyChart, go to [email or web address].    Your next appointment:   November 3rd at 2:30- with Dr.Kelly follow from [place].    Other Instructions  [address] [place] [address] Dept: [phone] Loc: [place]  12/09/2020  You are scheduled for a Cardiac Catheterization on Thursday, October 21 with Dr. [name].  1. Please arrive at the [facility] (Main Entrance A) at [facility]: [address] [place],  [address] at 5:30 AM (This time is two hours before your procedure to ensure your preparation). Free valet parking service is available.   Special note: Every effort is made to have your procedure done on time. Please understand that emergencies sometimes delay scheduled procedures.  2. Diet: Do not eat solid foods after midnight.  The patient may have clear liquids until 5am upon the day of the procedure.  3. Labs: You will need to have blood drawntoday- BMET, CBC  4. Medication instructions in preparation for your procedure:   Contrast Allergy: No   On the morning of your procedure, take your Aspirin and any morning medicines NOT listed above.  You may use sips of water.  5. Plan for one night stay--bring personal belongings. 6. Bring a current list of your medications and current insurance cards. 7. You MUST have a responsible person to drive you  home. 8. Someone MUST be with you the first 24 hours after you arrive home or your discharge will be delayed. 9. Please wear clothes that are easy to get on and off and wear slip-on shoes.  Thank you for allowing [place] to care for you!   -- [facility] Invasive Cardiovascular services    Signed, Eppie Gibson  12/09/2020 5:22 PM    [facility]

## 2020-12-06 NOTE — H&P (View-Only) (Signed)
Cardiology Office Note:    Date:  12/09/2020   ID:  Jacob Collier, DOB 02-09-1956, MRN [id number]  PCP:  Jacob Anes, MD  Cardiologist:  Jacob Majestic, MD  Electrophysiologist:  None   Referring MD: Jacob Collier,*   Chief Complaint: chest pain and palpitations  History of Present Illness:    Jacob Collier is a 65 y.o. male with a history of CAD s/p DES to RCA in 01/2019 with in-stent CTO of RCA and CTO of LCX on cath in 05/2019, palpitations with PVCs and paroxysmal SVT on monitor in 07/2019, hypertension, hyperlipidemia, anxiety, and prior prostate cancer who is followed by Dr. Claiborne Collier and presents today for further evaluation of chest pain and palpitations.   Patient has a known history of CAD and was previously followed by Cardiology at [facility] and now follows with Dr. Claiborne Collier. Reviewed records in [place]. Cardiac catheterization in 01/2019 at [facility] showed CTO of LCX and RCA. Patient underwent successful PCI with DES to proximal to mid RCA lesion. Zio monitor was ordered in 02/2019 which showed frequent PVC (8.5% burden) and non-sustained VT. Myoview was therefore ordered to rule out ischemia as the cause. This was done in 05/2019 showed inferolateral wall defect with associated wall motion abnormality compatible with prior MI. He was started on Ranexa but was unable to tolerate this. Patient presented to [facility] later that month with chest pain. Echo showed LVEF of 55-60% with akinesis of the basal inferior and inferoseptal wall and hypokinesis of inferolateral walls. He underwent repeat cardiac catheterization which showed instent CTO of RCA. Medical therapy was recommended with consideration for CABG if unsatisfactory response with optimization of antianginals.  He presented to [facility] ED in 06/2019 with palpitations and tightness in his neck and was found to be in SVT with rates in the 200s but converted back to sinus rhythm on his own. He  was started on Toprol-XL and Event Monitor was ordered which showed underlying sinus rhythm with average heart rate of 65 bpm and occasional PVCs/ventricular couplets, one 4 beat episodes of non-sustained VT, and a 36 second episode of SVT with rates in the 170s. There were no pauses or evidence of atrial fibrillation. Toprol-XL was increased.  Last Echo in 07/2020 showed LVEF of 50-55% with hypokinesis of the antero-lateral wall, posterior wall, and basal inferior segment, grade 2 diastolic dysfunction, mildly enlarged RV with normal systolic function, mild MR, and mild dilatation of the ascending aorta measuring 60mm.  He presented to the [facility] ED in 07/2020 with chest pain requiring 2 doses of Nitroglycerin. EKG showed no acute ischemic changes and high-sensitivity troponin was negative. Imdur was increased and he was discharge. He was seen by Dr. Claiborne Collier for follow-up in 08/2020 and denied any recurrent chest pain.  Patient called our office on 11/27/2020 with reports of chest pain. Therefore,this visit was scheduled for further evaluation.  He is here with his wife.  He reports recurrent chest pain over the last couple months but is similar to his prior pain and is becoming more frequent.  He states it feels like a squeezing/swishing sensation on the left side of his chest.  It improves with Nitro.  He has required multiple doses of Nitro recently and is been missing work several days a week due to the pain.  He also reports some chronic shortness of breath with exertion which is worse than usual.  No shortness of breath at rest.  No orthopnea or  PND.  No lower extremity edema on exam.  He continues to have occasional palpitations but overall well controlled with beta-blocker and as needed Klonopin.  During one episode of tachycardia he had chest pain with this and BP spiked.  However normally chest pain is associated with tachycardia.  He has self adjusted his BP medications due to the dizziness when standing  and feeling like a "zombie" when his BP is on the softer side.  He decreased his Imdur from 30 mg in the morning and 50 mg in the evening to 50 mg twice a day.  He also decreased his Toprol XL to 12.5 mg daily.  He is also on Amlodipine 2.5 mg daily and Losartan 25 mg daily.  Since making these changes, dizziness has improved and BP is still well controlled.  He denies any syncope.  He also has stopped taking Pravastatin due to myalgias and is interested in trying Repatha.  Past Medical History:  Diagnosis Date   Arthritis    Coronary artery disease    s/p prior stenting to RCA. Last cath in 05/2019 showed in-stent CTO of RCA and CTO of LCX (medical therpay recommended)   Diverticulosis    Hard of hearing    Hyperlipidemia    Hypertension    hx of elevation on lyrica, off med and now normal    Paroxysmal SVT (supraventricular tachycardia) ([place])    Prostate cancer ([place])    Wears glasses     Past Surgical History:  Procedure Laterality Date   CARDIAC CATHETERIZATION     with 2 stents    COLECTOMY  05/2018   EYE SURGERY  02/2020   bilaterl cataracts   PROSTATECTOMY     TOTAL HIP ARTHROPLASTY Left 11/10/2015   Procedure: LEFT TOTAL HIP ARTHROPLASTY ANTERIOR APPROACH;  Surgeon: Mcarthur Rossetti, MD;  Location: [facility];  Service: Orthopedics;  Laterality: Left;    Current Medications: Current Meds  Medication Sig   amLODipine (NORVASC) 2.5 MG tablet TAKE 1 TABLET BY MOUTH EVERY DAY (Patient taking differently: Take 2 2.5 mg for 5 mg total.)   aspirin 81 MG EC tablet Take 81 mg by mouth daily.    clonazePAM (KLONOPIN) 0.5 MG tablet TAKE 1 TABLET (0.5 MG TOTAL) BY MOUTH 3 (THREE) TIMES DAILY AS NEEDED FOR ANXIETY.   clopidogrel (PLAVIX) 75 MG tablet TAKE 1 TABLET BY MOUTH EVERY DAY   Eszopiclone 3 MG TABS TAKE 1 TABLET BY MOUTH EVERYDAY AT BEDTIME   ezetimibe (ZETIA) 10 MG tablet TAKE 1 TABLET BY MOUTH EVERY DAY   isosorbide mononitrate (IMDUR) 30 MG 24 hr tablet Take 30 mg in the  morning, and 15 mg in the evening. (Patient taking differently: Take 15mg  in the morning, and 15 mg in the evening.)   metoprolol succinate (TOPROL XL) 25 MG 24 hr tablet Take 1/2 tablet (12.5mg ) every other day alternating with full tablet (25mg ) the other days.   Omega-3 Fatty Acids (FISH OIL) 1000 MG CAPS Take 1,000 mg by mouth daily.    [DISCONTINUED] losartan (COZAAR) 25 MG tablet TAKE 1 TABLET BY MOUTH EVERY DAY (Patient taking differently: Takes 1/2 tablet)     Allergies:   Ranolazine and Statins   Social History   Socioeconomic History   Marital status: Married    Spouse name: Not on file   Number of children: 0   Years of education: Not on file   Highest education level: Not on file  Occupational History   Occupation: Employed at  [employer]  Tobacco Use   Smoking status: Never   Smokeless tobacco: Never  Vaping Use   Vaping Use: Former  Substance and Sexual Activity   Alcohol use: Not Currently    Alcohol/week: 0.0 standard drinks    Comment: rarely   Drug use: No   Sexual activity: Yes    Partners: Female    Birth control/protection: None  Other Topics Concern   Not on file  Social History Narrative   Not on file   Social Determinants of Health   Financial Resource Strain: Not on file  Food Insecurity: Not on file  Transportation Needs: Not on file  Physical Activity: Not on file  Stress: Not on file  Social Connections: Not on file     Family History: The patient's family history includes Alzheimer's disease in his mother; Cancer in his maternal uncle and maternal uncle; Colon polyps in his brother; Diabetes in his sister; Heart attack in his maternal grandmother; Prostate cancer in his maternal uncle and maternal uncle; Stroke in his father. There is no history of Colon cancer, Esophageal cancer, Rectal cancer, or Stomach cancer.  ROS:   Please see the history of present illness.     EKGs/Labs/Other Studies Reviewed:    The following studies were  reviewed today:  Cardiac Catheterization 01/06/2019 [facility]): Summary: - RCA and Circ CTO's Double vessel CAD - Normal LV function (echo) - Successful PCI / Promus Drug Eluting Stent 3.0x24 +16 for prox and mid RCA CTO segments.  Interventional Recommendations: medical therapy for complex circ CTO. PCI attempt if clinically indicated post PCI routine; DAPT x 12 months _______________   Echocardiogram 05/22/2019 [facility]): Summary: There is mild concentric left ventricular hypertrophy with basal inferior and inferoseptal akinesis and mild hypokinesis of the inferolateral hypokinesis. Left ventricular systolic function is overall normal with ejection fraction = 55-60%. There is mild aorticvalve thickening and mild aortic regurgitation. The mitral valve leaflets appear normal and mild mitral regurgitation. RVSP not able to be calculated. There is no comparison study available. _______________   Cardiac Catheterization 05/25/2019 [facility]): 2 Vessel CAD/CTO's: - CTO of mid/distal circ, complex anatomy - Instent CTO of RCA - Inferobasal akinesis, other segments normal; LVEF 50%   Recommendations: Medical therapy for CAD/angina. If unsatisfactory response, will  consider CABG.  LAD remains widely patent. _______________  Event Monitor 07/15/2019 to 08/13/2019: The patient was monitored for 30 days from Jul 15, 2019 through August 13, 2019.  The predominant rhythm was sinus rhythm with an average rate at 65 bpm.  The slowest heart rate was sinus bradycardia at 42 bpm which occurred on May 16 at 10:40 AM.  The fastest heart rate was sinus tachycardia at 164 bpm which occurred on May 24 at 1:32 PM.  The patient had occasional PVCs with a ventricular couplet, as well as several episodes of ventricular bigeminal rhythm.  There was an episode of 4 beats of nonsustained ventricular tachycardia on Jul 18, 2019 at 6:58 PM at 192 bpm.  There was a 36-second episode of supraventricular  tachycardia with PACs at 174 bpm.  There were no pauses.  There was no episodes of atrial fibrillation. _______________  Echocardiogram 07/22/2020: Impressions: 1. Left ventricular ejection fraction, by estimation, is 50 to 55%. The  left ventricle has low normal function. The left ventricle demonstrates  regional wall motion abnormalities (see scoring diagram/findings for  description). There is mild eccentric  left ventricular hypertrophy of the septal segment. Left ventricular  diastolic parameters  are consistent with Grade II diastolic dysfunction  (pseudonormalization).   2. Right ventricular systolic function is normal. The right ventricular  size is mildly enlarged. There is normal pulmonary artery systolic  pressure. The estimated right ventricular systolic pressure is 25.9 mmHg.   3. Left atrial size was mildly dilated.   4. The mitral valve is normal in structure. Mild mitral valve  regurgitation. No evidence of mitral stenosis.   5. The aortic valve is grossly normal. There is mild calcification of the  aortic valve. Aortic valve regurgitation is not visualized. No aortic  stenosis is present.   6. Aortic dilatation noted. There is mild dilatation of the ascending  aorta, measuring 41 mm.   7. The inferior vena cava is normal in size with greater than 50%  respiratory variability, suggesting right atrial pressure of 3 mmHg.  LV Wall Scoring:  The antero-lateral wall, posterior wall, and basal inferior segment are  hypokinetic.   EKG:  EKG ordered today. EKG personally reviewed and demonstrates normal sinus rhythm, raet 61 bpm, with inferior Q waves but no acute ST/T changes. Normal axis. Normal PR and QRS intervals. QTc 444 ms.  Recent Labs: 09/09/2020: Hemoglobin 18.2; Platelet Count 163 11/16/2020: ALT 23; BUN 12; Creatinine, Ser 1.12; Potassium 4.9; Sodium 141  Recent Lipid Panel    Component Value Date/Time   CHOL 181 11/16/2020 1138   TRIG 145 11/16/2020 1138   HDL  41 11/16/2020 1138   CHOLHDL 4.4 11/16/2020 1138   CHOLHDL 4.8 06/06/2019 0612   VLDL 29 06/06/2019 0612   LDLCALC 114 (H) 11/16/2020 1138    Physical Exam:    Vital Signs: BP 119/70   Pulse 61   Ht 5\' 11"  (1.803 m)   Wt 228 lb 6.4 oz (103.6 kg)   SpO2 97%   BMI 31.86 kg/m     Wt Readings from Last 3 Encounters:  12/09/20 228 lb 6.4 oz (103.6 kg)  11/26/20 227 lb (103 kg)  11/20/20 224 lb (101.6 kg)     General: 65 y.o. Caucasian male in no acute distress. HEENT: Normocephalic and atraumatic. Sclera clear.  Neck: Supple. No carotid bruits. No JVD. Heart: RRR. Distinct S1 and S2. No murmurs, gallops, or rubs. Radial pulses 2+ and equal bilaterally. Lungs: No increased work of breathing. Clear to ausculation bilaterally. No wheezes, rhonchi, or rales.  Abdomen: Soft, non-distended, and non-tender to palpation.  MSK: Normal strength and tone for age.  Extremities: No lower extremity edema.    Skin: Warm and dry. Neuro: Alert and oriented x3. No focal deficits. Psych: Normal affect. Responds appropriately.  Assessment:    1. Chest pain of uncertain etiology   2. Coronary artery disease involving native coronary artery of native heart with unstable angina pectoris (HCC)   3. Paroxysmal SVT (supraventricular tachycardia) ([place])   4. Primary hypertension   5. Hyperlipidemia, unspecified hyperlipidemia type     Plan:    Chest Pain CAD with Chronic Angina - LHC in 01/2019 showed CTO of RCA and LCX. Underwent successful PCI with DES to RCA lesions at that time. Repeat LHC in 05/2019 showed in-stent CTO of RCA. Medical therapy recommended at that time. - Patient has had recurrent chest pain that is responsive to Nitro. - Continue DAPT with Aspirin and  Plavix. - Current antianginals include: Amlodipine 2.5mg  daily, Imdur 15mg  twice daily, and Toprol-XL 12.5mg  daily. Will stop Losartan and increase Imdur to 30mg  in the morning and 15mg  in the evening (will increase gradually due  to soft BP recently with associated dizziness). - Intolerant to multiple statins. Continue Zetia. - After discussion with patient and Dr. Claiborne Collier, decision was made to proceed with diagnostic cardiac catheterization. We do not expect that we will be able intervene on anything but patient would still like to redefine anatomy. The patient understands that risks include but are not limited to stroke (1 in 1000), death (1 in 23), kidney failure [usually temporary] (1 in 500), bleeding (1 in 200), allergic reaction [possibly serious] (1 in 200), and agrees to proceed.   Procedure is scheduled for 12/24/2020 with Dr. Claiborne Collier. Will place pre-cath orders. Will check pre-cath (BMET and CBC) today.   Paroxsymal SVT History of Bradycardia - Monitor in May/June 2021 showed underlying sinus rhythm with average heart rate of 65 bpm and occasional PVCs/ventricular couplets, one 4 beat episodes of non-sustained VT, and a 36 second episode of SVT with rates in the 170s. He has also had some bradycardia with rates in the 40s in the past limiting up-titration of beta-blocker.  - Overall well controlled. - Continue Toprol-XL 12.5mg  daily.   Hypertension - BP well controlled in the office but he has had some soft readings at home with associated dizziness.  - Continue Amlodipine 2.5mg  daily. - Continue Toprol-XL 12.5mg  daily. - Stop Losartan and increase Imdur to 30mg  in the morning and 15mg  in the evening.    Hyperlipidemia - Recent lipid panel on 11/16/2020: Total Cholesterol 181, Triglycerides 145, HDL 41, LDL 114.  - LDL goal <70 given CAD.  - Intolerant to multiple statins in the past including Lipitor, Crestor, and Pravastatin. Continue Zetia 10mg  daily. Currently on Pravastatin 20mg  daily and Zetia 10mg  daily. - Will refer to lipid clinic for consideration of PCSK9 inhibitor.   Disposition: Follow up in in about 1 months after heart cath.   Medication Adjustments/Labs and Tests Ordered: Current medicines  are reviewed at length with the patient today.  Concerns regarding medicines are outlined above.  Orders Placed This Encounter  Procedures   Basic metabolic panel   CBC   AMB Referral to Advanced Lipid Disorders Clinic   EKG 12-Lead    No orders of the defined types were placed in this encounter.   Patient Instructions  Medication Instructions:  STOP Losartan  Increase Imdur to 30 mg in the morning and 15 mg in the evening.  *If you need a refill on your cardiac medications before your next appointment, please call your pharmacy*   Lab Work: BMET, CBC today   If you have labs (blood work) drawn today and your tests are completely normal, you will receive your results only by: [place] (if you have MyChart) OR A paper copy in the mail If you have any lab test that is abnormal or we need to change your treatment, we will call you to review the results.   Testing/Procedures: Your physician has requested that you have a cardiac catheterization. Cardiac catheterization is used to diagnose and/or treat various heart conditions. Doctors may recommend this procedure for a number of different reasons. The most common reason is to evaluate chest pain. Chest pain can be a symptom of coronary artery disease (CAD), and cardiac catheterization can show whether plaque is narrowing or blocking your heart's arteries. This procedure is also used to evaluate the valves, as well as measure the blood flow and oxygen levels in different parts of your heart. For further information please visit [email or web address].[email or web address]. Please follow instruction sheet, as given.   Follow-Up: At  [facility], you and your health needs are our priority.  As part of our continuing mission to provide you with exceptional heart care, we have created designated Provider Care Teams.  These Care Teams include your primary Cardiologist (physician) and Advanced Practice Providers (APPs -  Physician Assistants and Nurse  Practitioners) who all work together to provide you with the care you need, when you need it.  We recommend signing up for the patient portal called "MyChart".  Sign up information is provided on this After Visit Summary.  MyChart is used to connect with patients for Virtual Visits (Telemedicine).  Patients are able to view lab/test results, encounter notes, upcoming appointments, etc.  Non-urgent messages can be sent to your provider as well.   To learn more about what you can do with MyChart, go to [email or web address].    Your next appointment:   November 3rd at 2:30- with Dr.Kelly follow from [place].    Other Instructions  [place] [address] Dept: [phone] Loc: [place]  12/09/2020  You are scheduled for a Cardiac Catheterization on Thursday, October 21 with Dr. Shelva Collier.  1. Please arrive at the [facility] (Main Entrance A) at [facility]: [address] [place], [place] [address] at 5:30 AM (This time is two hours before your procedure to ensure your preparation). Free valet parking service is available.   Special note: Every effort is made to have your procedure done on time. Please understand that emergencies sometimes delay scheduled procedures.  2. Diet: Do not eat solid foods after midnight.  The patient may have clear liquids until 5am upon the day of the procedure.  3. Labs: You will need to have blood drawntoday- BMET, CBC  4. Medication instructions in preparation for your procedure:   Contrast Allergy: No   On the morning of your procedure, take your Aspirin and any morning medicines NOT listed above.  You may use sips of water.  5. Plan for one night stay--bring personal belongings. 6. Bring a current list of your medications and current insurance cards. 7. You MUST have a responsible person to drive you  home. 8. Someone MUST be with you the first 24 hours after you arrive home or your discharge will be delayed. 9. Please wear clothes that are easy to get on and off and wear slip-on shoes.  Thank you for allowing [place] to care for you!   -- [facility] Invasive Cardiovascular services    Signed, Eppie Gibson  12/09/2020 5:22 PM    [facility]

## 2020-12-08 ENCOUNTER — Encounter: Payer: Self-pay | Admitting: Student

## 2020-12-08 DIAGNOSIS — I10 Essential (primary) hypertension: Secondary | ICD-10-CM | POA: Insufficient documentation

## 2020-12-09 ENCOUNTER — Ambulatory Visit (INDEPENDENT_AMBULATORY_CARE_PROVIDER_SITE_OTHER): Payer: Managed Care, Other (non HMO) | Admitting: Student

## 2020-12-09 ENCOUNTER — Encounter: Payer: Self-pay | Admitting: Student

## 2020-12-09 ENCOUNTER — Other Ambulatory Visit: Payer: Self-pay

## 2020-12-09 VITALS — BP 119/70 | HR 61 | Ht 71.0 in | Wt 228.4 lb

## 2020-12-09 DIAGNOSIS — I25110 Atherosclerotic heart disease of native coronary artery with unstable angina pectoris: Secondary | ICD-10-CM

## 2020-12-09 DIAGNOSIS — R079 Chest pain, unspecified: Secondary | ICD-10-CM

## 2020-12-09 DIAGNOSIS — I471 Supraventricular tachycardia, unspecified: Secondary | ICD-10-CM

## 2020-12-09 DIAGNOSIS — I10 Essential (primary) hypertension: Secondary | ICD-10-CM

## 2020-12-09 DIAGNOSIS — E785 Hyperlipidemia, unspecified: Secondary | ICD-10-CM

## 2020-12-09 MED ORDER — METOPROLOL SUCCINATE ER 25 MG PO TB24: ORAL_TABLET | ORAL | 3 refills | Status: DC

## 2020-12-09 NOTE — Patient Instructions (Addendum)
Medication Instructions:  STOP Losartan  Increase Imdur to 30 mg in the morning and 15 mg in the evening.  *If you need a refill on your cardiac medications before your next appointment, please call your pharmacy*   Lab Work: BMET, CBC today   If you have labs (blood work) drawn today and your tests are completely normal, you will receive your results only by: [place] (if you have MyChart) OR A paper copy in the mail If you have any lab test that is abnormal or we need to change your treatment, we will call you to review the results.   Testing/Procedures: Your physician has requested that you have a cardiac catheterization. Cardiac catheterization is used to diagnose and/or treat various heart conditions. Doctors may recommend this procedure for a number of different reasons. The most common reason is to evaluate chest pain. Chest pain can be a symptom of coronary artery disease (CAD), and cardiac catheterization can show whether plaque is narrowing or blocking your heart's arteries. This procedure is also used to evaluate the valves, as well as measure the blood flow and oxygen levels in different parts of your heart. For further information please visit HugeFiesta.[email or web address]. Please follow instruction sheet, as given.   Follow-Up: At [facility], you and your health needs are our priority.  As part of our continuing mission to provide you with exceptional heart care, we have created designated Provider Care Teams.  These Care Teams include your primary Cardiologist (physician) and Advanced Practice Providers (APPs -  Physician Assistants and Nurse Practitioners) who all work together to provide you with the care you need, when you need it.  We recommend signing up for the patient portal called "MyChart".  Sign up information is provided on this After Visit Summary.  MyChart is used to connect with patients for Virtual Visits (Telemedicine).  Patients are able to view lab/test  results, encounter notes, upcoming appointments, etc.  Non-urgent messages can be sent to your provider as well.   To learn more about what you can do with MyChart, go to [email or web address].    Your next appointment:   November 3rd at 2:30- with Dr.Kelly follow from [place].    Other Instructions  [place] [address] Dept: [phone] Loc: [place]  12/09/2020  You are scheduled for a Cardiac Catheterization on Thursday, October 21 with Dr. [name].  1. Please arrive at the [facility] (Main Entrance A) at [facility]: [address] [place], [place] [address] at 5:30 AM (This time is two hours before your procedure to ensure your preparation). Free valet parking service is available.   Special note: Every effort is made to have your procedure done on time. Please understand that emergencies sometimes delay scheduled procedures.  2. Diet: Do not eat solid foods after midnight.  The patient may have clear liquids until 5am upon the day of the procedure.  3. Labs: You will need to have blood drawntoday- BMET, CBC  4. Medication instructions in preparation for your procedure:   Contrast Allergy: No   On the morning of your procedure, take your Aspirin and any morning medicines NOT listed above.  You may use sips of water.  5. Plan for one night stay--bring personal belongings. 6. Bring a current list of your medications and current insurance cards. 7. You MUST have a responsible person to drive you home. 8. Someone  MUST be with you the first 24 hours after you arrive home or your discharge will be delayed. 9. Please wear clothes that are easy to get on and off and wear slip-on shoes.  Thank you for allowing [place] to care for you!   -- [place] Invasive Cardiovascular services

## 2020-12-10 LAB — BASIC METABOLIC PANEL
BUN/Creatinine Ratio: 15 (ref 10–24)
BUN: 17 mg/dL (ref 8–27)
CO2: 19 mmol/L — ABNORMAL LOW (ref 20–29)
Calcium: 9.8 mg/dL (ref 8.6–10.2)
Chloride: 102 mmol/L (ref 96–106)
Creatinine, Ser: 1.11 mg/dL (ref 0.76–1.27)
Glucose: 82 mg/dL (ref 70–99)
Potassium: 4.8 mmol/L (ref 3.5–5.2)
Sodium: 137 mmol/L (ref 134–144)
eGFR: 73 mL/min/{1.73_m2} (ref >59)

## 2020-12-10 LAB — CBC
Hematocrit: 53.2 % — ABNORMAL HIGH (ref 37.5–51.0)
Hemoglobin: 18.2 g/dL — ABNORMAL HIGH (ref 13.0–17.7)
MCH: 29.9 pg (ref 26.6–33.0)
MCHC: 34.2 g/dL (ref 31.5–35.7)
MCV: 88 fL (ref 79–97)
Platelets: 186 10*3/uL (ref 150–450)
RBC: 6.08 x10E6/uL — ABNORMAL HIGH (ref 4.14–5.80)
RDW: 14 % (ref 11.6–15.4)
WBC: 7.3 10*3/uL (ref 3.4–10.8)

## 2020-12-22 ENCOUNTER — Telehealth: Payer: Self-pay | Admitting: *Deleted

## 2020-12-22 NOTE — Telephone Encounter (Signed)
Cardiac catheterization scheduled at [facility] for: Thursday December 24, 2020 7:30 [facility] Main Entrance [facility]) at: 5:30 AM   No solid food after midnight prior to cath, clear liquids until 5 AM day of procedure.   Usual morning medications can be taken pre-cath with sips of water including: -aspirin 81 mg -Plavix 75 mg     Confirmed patient has responsible adult to drive home post procedure and be with patient first 24 hours after arriving home.  [facility] does allow one visitor to accompany you and wait in the hospital waiting room while you are there for your procedure. You and your visitor will be asked to wear a mask once you enter the hospital.   Patient reports does not currently have any symptoms concerning for COVID-19 and no household members with COVID-19 like illness.      Reviewed procedure/mask/visitor instructions with patient.

## 2020-12-24 ENCOUNTER — Other Ambulatory Visit: Payer: Self-pay

## 2020-12-24 ENCOUNTER — Encounter (HOSPITAL_COMMUNITY)
Admission: RE | Disposition: A | Discharge status: Home / Self Care | Payer: Self-pay | Source: Ambulatory Visit | Attending: Cardiology

## 2020-12-24 ENCOUNTER — Ambulatory Visit (HOSPITAL_COMMUNITY)
Admission: RE | Admit: 2020-12-24 | Discharge: 2020-12-24 | Disposition: A | Discharge status: Home / Self Care | Payer: Managed Care, Other (non HMO) | Source: Ambulatory Visit | Attending: Cardiology | Admitting: Cardiology

## 2020-12-24 DIAGNOSIS — I25110 Atherosclerotic heart disease of native coronary artery with unstable angina pectoris: Secondary | ICD-10-CM | POA: Diagnosis not present

## 2020-12-24 DIAGNOSIS — I471 Supraventricular tachycardia: Secondary | ICD-10-CM | POA: Insufficient documentation

## 2020-12-24 DIAGNOSIS — I25119 Atherosclerotic heart disease of native coronary artery with unspecified angina pectoris: Secondary | ICD-10-CM

## 2020-12-24 DIAGNOSIS — I10 Essential (primary) hypertension: Secondary | ICD-10-CM | POA: Insufficient documentation

## 2020-12-24 DIAGNOSIS — I2510 Atherosclerotic heart disease of native coronary artery without angina pectoris: Secondary | ICD-10-CM | POA: Diagnosis present

## 2020-12-24 DIAGNOSIS — I2089 Other forms of angina pectoris: Secondary | ICD-10-CM | POA: Diagnosis present

## 2020-12-24 DIAGNOSIS — E785 Hyperlipidemia, unspecified: Secondary | ICD-10-CM | POA: Diagnosis not present

## 2020-12-24 DIAGNOSIS — I200 Unstable angina: Secondary | ICD-10-CM | POA: Diagnosis present

## 2020-12-24 DIAGNOSIS — Z79899 Other long term (current) drug therapy: Secondary | ICD-10-CM | POA: Diagnosis not present

## 2020-12-24 DIAGNOSIS — Z7982 Long term (current) use of aspirin: Secondary | ICD-10-CM | POA: Diagnosis not present

## 2020-12-24 DIAGNOSIS — Z7902 Long term (current) use of antithrombotics/antiplatelets: Secondary | ICD-10-CM | POA: Insufficient documentation

## 2020-12-24 DIAGNOSIS — Z888 Allergy status to other drugs, medicaments and biological substances status: Secondary | ICD-10-CM | POA: Insufficient documentation

## 2020-12-24 DIAGNOSIS — Z8249 Family history of ischemic heart disease and other diseases of the circulatory system: Secondary | ICD-10-CM | POA: Insufficient documentation

## 2020-12-24 DIAGNOSIS — I208 Other forms of angina pectoris: Secondary | ICD-10-CM | POA: Diagnosis present

## 2020-12-24 DIAGNOSIS — I209 Angina pectoris, unspecified: Secondary | ICD-10-CM | POA: Diagnosis present

## 2020-12-24 HISTORY — PX: LEFT HEART CATH AND CORONARY ANGIOGRAPHY: CATH118249

## 2020-12-24 SURGERY — LEFT HEART CATH AND CORONARY ANGIOGRAPHY
Anesthesia: LOCAL

## 2020-12-24 MED ORDER — VERAPAMIL HCL 2.5 MG/ML IV SOLN
INTRAVENOUS | Status: AC
  Filled 2020-12-24: qty 2

## 2020-12-24 MED ORDER — SODIUM CHLORIDE 0.9% FLUSH: 3.0000 mL | INTRAVENOUS | Status: DC | PRN

## 2020-12-24 MED ORDER — SODIUM CHLORIDE 0.9 % IV SOLN: 250.0000 mL | INTRAVENOUS | Status: DC | PRN

## 2020-12-24 MED ORDER — HEPARIN SODIUM (PORCINE) 1000 UNIT/ML IJ SOLN
INTRAMUSCULAR | Status: DC | PRN
  Administered 2020-12-24: 5000 [IU] via INTRAVENOUS

## 2020-12-24 MED ORDER — VERAPAMIL HCL 2.5 MG/ML IV SOLN
INTRAVENOUS | Status: DC | PRN
  Administered 2020-12-24: 10 mL via INTRA_ARTERIAL

## 2020-12-24 MED ORDER — FENTANYL CITRATE (PF) 100 MCG/2ML IJ SOLN
INTRAMUSCULAR | Status: DC | PRN
  Administered 2020-12-24 (×2): 25 ug via INTRAVENOUS

## 2020-12-24 MED ORDER — MIDAZOLAM HCL 2 MG/2ML IJ SOLN
INTRAMUSCULAR | Status: DC | PRN
  Administered 2020-12-24 (×2): 1 mg via INTRAVENOUS

## 2020-12-24 MED ORDER — SODIUM CHLORIDE 0.9 % WEIGHT BASED INFUSION
3.0000 mL/kg/h | INTRAVENOUS | Status: AC
  Administered 2020-12-24: 3 mL/kg/h via INTRAVENOUS

## 2020-12-24 MED ORDER — SODIUM CHLORIDE 0.9 % WEIGHT BASED INFUSION: 1.0000 mL/kg/h | INTRAVENOUS | Status: AC

## 2020-12-24 MED ORDER — SODIUM CHLORIDE 0.9% FLUSH
3.0000 mL | INTRAVENOUS | Status: DC | PRN
Start: 2020-12-24 — End: 2020-12-24

## 2020-12-24 MED ORDER — LIDOCAINE HCL (PF) 1 % IJ SOLN
INTRAMUSCULAR | Status: DC | PRN
  Administered 2020-12-24: 2 mL

## 2020-12-24 MED ORDER — ASPIRIN 81 MG PO CHEW: 81.0000 mg | CHEWABLE_TABLET | ORAL | Status: DC

## 2020-12-24 MED ORDER — HEPARIN (PORCINE) IN NACL 1000-0.9 UT/500ML-% IV SOLN
INTRAVENOUS | Status: DC | PRN
  Administered 2020-12-24 (×2): 500 mL

## 2020-12-24 MED ORDER — SODIUM CHLORIDE 0.9% FLUSH: 3.0000 mL | Freq: Two times a day (BID) | INTRAVENOUS | Status: DC

## 2020-12-24 MED ORDER — FENTANYL CITRATE (PF) 100 MCG/2ML IJ SOLN
INTRAMUSCULAR | Status: AC
  Filled 2020-12-24: qty 2

## 2020-12-24 MED ORDER — ONDANSETRON HCL 4 MG/2ML IJ SOLN: 4.0000 mg | Freq: Four times a day (QID) | INTRAMUSCULAR | Status: DC | PRN

## 2020-12-24 MED ORDER — MIDAZOLAM HCL 2 MG/2ML IJ SOLN
INTRAMUSCULAR | Status: AC
  Filled 2020-12-24: qty 2

## 2020-12-24 MED ORDER — IOHEXOL 350 MG/ML SOLN
INTRAVENOUS | Status: DC | PRN
  Administered 2020-12-24: 85 mL

## 2020-12-24 MED ORDER — SODIUM CHLORIDE 0.9 % WEIGHT BASED INFUSION: 1.0000 mL/kg/h | INTRAVENOUS | Status: DC

## 2020-12-24 MED ORDER — ACETAMINOPHEN 325 MG PO TABS: 650.0000 mg | ORAL_TABLET | ORAL | Status: DC | PRN

## 2020-12-24 MED ORDER — LIDOCAINE HCL (PF) 1 % IJ SOLN
INTRAMUSCULAR | Status: AC
  Filled 2020-12-24: qty 30

## 2020-12-24 MED ORDER — HEPARIN (PORCINE) IN NACL 1000-0.9 UT/500ML-% IV SOLN
INTRAVENOUS | Status: AC
  Filled 2020-12-24: qty 500

## 2020-12-24 SURGICAL SUPPLY — 9 items
CATH 5FR JL3.5 JR4 ANG PIG MP (CATHETERS) ×2 IMPLANT
DEVICE RAD COMP TR BAND LRG (VASCULAR PRODUCTS) ×2 IMPLANT
GLIDESHEATH SLEND SS 6F .021 (SHEATH) ×2 IMPLANT
GUIDEWIRE INQWIRE 1.5J.035X260 (WIRE) ×1 IMPLANT
INQWIRE 1.5J .035X260CM (WIRE) ×2
KIT HEART LEFT (KITS) ×2 IMPLANT
PACK CARDIAC CATHETERIZATION (CUSTOM PROCEDURE TRAY) ×2 IMPLANT
TRANSDUCER W/STOPCOCK (MISCELLANEOUS) ×2 IMPLANT
TUBING CIL FLEX 10 FLL-RA (TUBING) ×2 IMPLANT

## 2020-12-24 NOTE — Interval H&P Note (Signed)
History and Physical Interval Note:  12/24/2020 7:27 AM  Jacob Collier  has presented today for surgery, with the diagnosis of unstable angina.  The various methods of treatment have been discussed with the patient and family. After consideration of risks, benefits and other options for treatment, the patient has consented to  Procedure(s): LEFT HEART CATH AND CORONARY ANGIOGRAPHY (N/A) as a surgical intervention.  The patient's history has been reviewed, patient examined, no change in status, stable for surgery.  I have reviewed the patient's chart and labs.  Questions were answered to the patient's satisfaction.    Cath Lab Visit (complete for each Cath Lab visit)  Clinical Evaluation Leading to the Procedure:   ACS: No.  Non-ACS:    Anginal Classification: CCS II  Anti-ischemic medical therapy: Maximal Therapy (2 or more classes of medications)  Non-Invasive Test Results: No non-invasive testing performed  Prior CABG: No previous CABG       Collier [facility] 12/24/2020 7:27 AM

## 2020-12-24 NOTE — Progress Notes (Signed)
Pt ambulated without difficulty or bleeding.   Discharged home with his friend who will drive and stay with pt x 24 hrs.

## 2020-12-25 ENCOUNTER — Encounter (HOSPITAL_COMMUNITY): Payer: Self-pay | Admitting: Cardiology

## 2020-12-29 ENCOUNTER — Other Ambulatory Visit: Payer: Self-pay

## 2020-12-29 DIAGNOSIS — D751 Secondary polycythemia: Secondary | ICD-10-CM | POA: Diagnosis not present

## 2020-12-29 DIAGNOSIS — Z20822 Contact with and (suspected) exposure to covid-19: Secondary | ICD-10-CM | POA: Diagnosis not present

## 2020-12-29 DIAGNOSIS — Z79899 Other long term (current) drug therapy: Secondary | ICD-10-CM | POA: Diagnosis not present

## 2020-12-29 DIAGNOSIS — Z8546 Personal history of malignant neoplasm of prostate: Secondary | ICD-10-CM | POA: Diagnosis not present

## 2020-12-29 DIAGNOSIS — I10 Essential (primary) hypertension: Secondary | ICD-10-CM | POA: Diagnosis not present

## 2020-12-29 DIAGNOSIS — Z96642 Presence of left artificial hip joint: Secondary | ICD-10-CM | POA: Diagnosis not present

## 2020-12-29 DIAGNOSIS — I200 Unstable angina: Secondary | ICD-10-CM | POA: Diagnosis not present

## 2020-12-29 DIAGNOSIS — R079 Chest pain, unspecified: Secondary | ICD-10-CM | POA: Diagnosis not present

## 2020-12-30 ENCOUNTER — Other Ambulatory Visit: Payer: Self-pay

## 2020-12-30 ENCOUNTER — Emergency Department (HOSPITAL_COMMUNITY): Payer: Managed Care, Other (non HMO)

## 2020-12-30 ENCOUNTER — Emergency Department (HOSPITAL_COMMUNITY)
Admission: EM | Admit: 2020-12-30 | Discharge: 2020-12-30 | Disposition: A | Discharge status: Home / Self Care | Payer: Managed Care, Other (non HMO) | Attending: Emergency Medicine | Admitting: Emergency Medicine

## 2020-12-30 DIAGNOSIS — D751 Secondary polycythemia: Secondary | ICD-10-CM

## 2020-12-30 DIAGNOSIS — I2089 Other forms of angina pectoris: Secondary | ICD-10-CM

## 2020-12-30 DIAGNOSIS — R0789 Other chest pain: Secondary | ICD-10-CM

## 2020-12-30 DIAGNOSIS — R079 Chest pain, unspecified: Secondary | ICD-10-CM

## 2020-12-30 DIAGNOSIS — I208 Other forms of angina pectoris: Secondary | ICD-10-CM

## 2020-12-30 DIAGNOSIS — F419 Anxiety disorder, unspecified: Secondary | ICD-10-CM

## 2020-12-30 DIAGNOSIS — I200 Unstable angina: Secondary | ICD-10-CM

## 2020-12-30 DIAGNOSIS — I25110 Atherosclerotic heart disease of native coronary artery with unstable angina pectoris: Secondary | ICD-10-CM

## 2020-12-30 LAB — BASIC METABOLIC PANEL
Anion gap: 9 (ref 5–15)
BUN: 17 mg/dL (ref 8–23)
CO2: 21 mmol/L — ABNORMAL LOW (ref 22–32)
Calcium: 9.4 mg/dL (ref 8.9–10.3)
Chloride: 104 mmol/L (ref 98–111)
Creatinine, Ser: 1.21 mg/dL (ref 0.61–1.24)
GFR, Estimated: >60 mL/min (ref >60)
Glucose, Bld: 97 mg/dL (ref 70–99)
Potassium: 3.9 mmol/L (ref 3.5–5.1)
Sodium: 134 mmol/L — ABNORMAL LOW (ref 135–145)

## 2020-12-30 LAB — RESP PANEL BY RT-PCR (FLU A&B, COVID) ARPGX2
Influenza A by PCR: NEGATIVE
Influenza B by PCR: NEGATIVE
SARS Coronavirus 2 by RT PCR: NEGATIVE

## 2020-12-30 LAB — CBC WITH DIFFERENTIAL/PLATELET
Abs Immature Granulocytes: 0.03 10*3/uL (ref 0.00–0.07)
Basophils Absolute: 0.1 10*3/uL (ref 0.0–0.1)
Basophils Relative: 1 %
Eosinophils Absolute: 0.2 10*3/uL (ref 0.0–0.5)
Eosinophils Relative: 2 %
HCT: 54.2 % — ABNORMAL HIGH (ref 39.0–52.0)
Hemoglobin: 18.5 g/dL — ABNORMAL HIGH (ref 13.0–17.0)
Immature Granulocytes: 0 %
Lymphocytes Relative: 17 %
Lymphs Abs: 1.5 10*3/uL (ref 0.7–4.0)
MCH: 30.5 pg (ref 26.0–34.0)
MCHC: 34.1 g/dL (ref 30.0–36.0)
MCV: 89.3 fL (ref 80.0–100.0)
Monocytes Absolute: 0.6 10*3/uL (ref 0.1–1.0)
Monocytes Relative: 6 %
Neutro Abs: 6.9 10*3/uL (ref 1.7–7.7)
Neutrophils Relative %: 74 %
Platelets: 188 10*3/uL (ref 150–400)
RBC: 6.07 MIL/uL — ABNORMAL HIGH (ref 4.22–5.81)
RDW: 13.7 % (ref 11.5–15.5)
WBC: 9.2 10*3/uL (ref 4.0–10.5)
nRBC: 0 % (ref 0.0–0.2)

## 2020-12-30 LAB — TROPONIN I (HIGH SENSITIVITY)
Troponin I (High Sensitivity): 13 ng/L (ref <18)
Troponin I (High Sensitivity): 13 ng/L (ref <18)

## 2020-12-30 MED ORDER — ASPIRIN 81 MG PO CHEW
324.0000 mg | CHEWABLE_TABLET | Freq: Once | ORAL | Status: AC
  Administered 2020-12-30: 324 mg via ORAL

## 2020-12-30 MED ORDER — CLONAZEPAM 0.5 MG PO TABS: 0.5000 mg | ORAL_TABLET | Freq: Three times a day (TID) | ORAL | 0 refills | Status: DC | PRN

## 2020-12-30 MED ORDER — NITROGLYCERIN 0.4 MG SL SUBL
0.4000 mg | SUBLINGUAL_TABLET | SUBLINGUAL | Status: DC | PRN
Start: 2020-12-30 — End: 2020-12-30
  Administered 2020-12-30: 0.4 mg via SUBLINGUAL
  Filled 2020-12-30: qty 1

## 2020-12-30 MED ORDER — LORAZEPAM 2 MG/ML IJ SOLN
1.0000 mg | Freq: Once | INTRAMUSCULAR | Status: AC
  Administered 2020-12-30: 1 mg via INTRAVENOUS
  Filled 2020-12-30: qty 1

## 2020-12-30 MED ORDER — HEPARIN (PORCINE) 25000 UT/250ML-% IV SOLN
1200.0000 [IU]/h | INTRAVENOUS | Status: DC
  Administered 2020-12-30: 1200 [IU]/h via INTRAVENOUS
  Filled 2020-12-30: qty 250

## 2020-12-30 MED ORDER — HEPARIN BOLUS VIA INFUSION
4000.0000 [IU] | Freq: Once | INTRAVENOUS | Status: AC
  Administered 2020-12-30: 4000 [IU] via INTRAVENOUS
  Filled 2020-12-30: qty 4000

## 2020-12-30 NOTE — ED Triage Notes (Signed)
Pt c/o ongoing chest pain worsening today. Cardiac Cath last Thursday with 4 partial occulusion. Has cardiology follow up appointment this coming Friday. Took NTG x2 with some chest pain relief.

## 2020-12-30 NOTE — Progress Notes (Signed)
[address]       [place] [address]             [phone]        Jacob Collier Medical Record #[id number] Date of Birth: 21-Apr-1955  Referring: Martinique, Peter M, MD Primary Care: [name], MD Primary Cardiologist:Thomas [name], MD  Chief Complaint:    Chief Complaint  Patient presents with   Coronary Artery Disease    Surgical consult, Cardiac Cath 12/24/20, ECHO 07/22/20    History of Present Illness:     This is a 65 year old gentleman that presents for surgical evaluation of severe three-vessel coronary artery disease.  He has a long history of chest pain and exertional shortness of breath.  Additionally he admits to use of anabolic steroids throughout most of his life.  He also has severe anxiety and when he has a panic attack he feels that this worsens his chest pain or shortness of breath.  Currently is chest pain-free.  Can't take statins On testosterone Urology consult for foley placement    Past Medical History:  Diagnosis Date   Arthritis    Coronary artery disease    s/p prior stenting to RCA. Last cath in 05/2019 showed in-stent CTO of RCA and CTO of LCX (medical therpay recommended)   Diverticulosis    Hard of hearing    Hyperlipidemia    Hypertension    hx of elevation on lyrica, off med and now normal    Paroxysmal SVT (supraventricular tachycardia) ([place])    Prostate cancer ([place])    Wears glasses     Past Surgical History:  Procedure Laterality Date   CARDIAC CATHETERIZATION     with 2 stents    COLECTOMY  05/2018   EYE SURGERY  02/2020   bilaterl cataracts   LEFT HEART CATH AND CORONARY ANGIOGRAPHY N/A 12/24/2020   Procedure: LEFT HEART CATH AND CORONARY ANGIOGRAPHY;  Surgeon: Martinique, Peter M, MD;  Location: [place] [facility];  Service: Cardiovascular;  Laterality: N/A;   PROSTATECTOMY     TOTAL HIP ARTHROPLASTY Left 11/10/2015   Procedure: LEFT TOTAL HIP ARTHROPLASTY ANTERIOR APPROACH;   Surgeon: Mcarthur Rossetti, MD;  Location: [facility];  Service: Orthopedics;  Laterality: Left;      Social History   Tobacco Use  Smoking Status Never  Smokeless Tobacco Never    Social History   Substance and Sexual Activity  Alcohol Use Not Currently   Alcohol/week: 0.0 standard drinks   Comment: rarely     Allergies  Allergen Reactions   Ranolazine     Chest discomfort/Reflux   Statins Other (See Comments)    myalgias      Current Outpatient Medications  Medication Sig Dispense Refill   acetaminophen (TYLENOL) 500 MG tablet Take 1,000 mg by mouth every 8 (eight) hours as needed for mild pain.     amLODipine (NORVASC) 5 MG tablet Take 5 mg by mouth daily.     aspirin EC 81 MG tablet Take 81 mg by mouth daily. Swallow whole.     clonazePAM (KLONOPIN) 1 MG tablet Take 1 tablet (1 mg total) by mouth 2 (two) times daily as needed for anxiety. 15 tablet 0   clopidogrel (PLAVIX) 75 MG tablet Take 75 mg by mouth daily.     Eszopiclone 3 MG TABS TAKE 1 TABLET BY MOUTH EVERYDAY AT BEDTIME (Patient taking differently: Take 3 mg by mouth at bedtime.) 30 tablet 2  ezetimibe (ZETIA) 10 MG tablet TAKE 1 TABLET BY MOUTH EVERY DAY (Patient taking differently: Take 10 mg by mouth daily.) 90 tablet 3   isosorbide mononitrate (IMDUR) 30 MG 24 hr tablet Take 30 mg in the morning, and 15 mg in the evening. (Patient taking differently: Take 15-30 mg by mouth See admin instructions. Take 30 mg in the morning, and 15 mg in the evening.) 180 tablet 1   Omega-3 Fatty Acids (FISH OIL) 1200 MG CAPS Take 2,400 mg by mouth 2 (two) times daily.     nitroGLYCERIN (NITROSTAT) 0.4 MG SL tablet Place 1 tablet (0.4 mg total) under the tongue every 5 (five) minutes as needed for chest pain. (Patient not taking: Reported on 01/01/2021) 25 tablet 2   No current facility-administered medications for this visit.    (Not in a hospital admission)   Family History  Problem Relation Age of Onset    Alzheimer's disease Mother    Stroke Father    Diabetes Sister    Heart attack Maternal Grandmother    Cancer Maternal Uncle        prostate   Prostate cancer Maternal Uncle    Cancer Maternal Uncle        prostate   Prostate cancer Maternal Uncle    Colon polyps Brother    Colon cancer Neg Hx    Esophageal cancer Neg Hx    Rectal cancer Neg Hx    Stomach cancer Neg Hx      Review of Systems:   Review of Systems  Constitutional:  Positive for malaise/fatigue.  Respiratory:  Positive for shortness of breath.   Cardiovascular:  Positive for chest pain and leg swelling.  Genitourinary:  Positive for dysuria and frequency.  Musculoskeletal: Negative.   Neurological: Negative.      Physical Exam: BP 140/85   Pulse 62   Resp 20   Ht 5\' 11"  (1.803 m)   Wt 215 lb (97.5 kg)   SpO2 96% Comment: RA  BMI 29.99 kg/m  Physical Exam Constitutional:      Appearance: He is obese. He is not ill-appearing.  HENT:     Head: Normocephalic and atraumatic.  Cardiovascular:     Rate and Rhythm: Normal rate.  Pulmonary:     Effort: Pulmonary effort is normal. No respiratory distress.  Abdominal:     General: There is no distension.  Musculoskeletal:        General: Normal range of motion.     Cervical back: Normal range of motion.  Skin:    General: Skin is warm and dry.  Neurological:     General: No focal deficit present.     Mental Status: He is alert and oriented to person, place, and time.        I have independently reviewed the above radiologic studies and discussed with the patient   Recent Lab Findings: Lab Results  Component Value Date   WBC 9.2 12/30/2020   HGB 18.5 (H) 12/30/2020   HCT 54.2 (H) 12/30/2020   PLT 188 12/30/2020   GLUCOSE 97 12/30/2020   CHOL 181 11/16/2020   TRIG 145 11/16/2020   HDL 41 11/16/2020   LDLCALC 114 (H) 11/16/2020   ALT 23 11/16/2020   AST 16 11/16/2020   NA 134 (L) 12/30/2020   K 3.9 12/30/2020   CL 104 12/30/2020    CREATININE 1.21 12/30/2020   BUN 17 12/30/2020   CO2 21 (L) 12/30/2020   INR 1.03 06/24/2009  HGBA1C 5.8 (H) 11/28/2019      Assessment / Plan:   65 year old male with severe three-vessel coronary artery disease.  I personally reviewed his left heart cath.  Essentially he is living off of collaterals from his LAD and proximal RCA.  In regards to his inferior wall the PDA fills from right to right collateral off the proximal RCA.  The circumflex is filled via LAD collaterals.  He also has a significant stenosis in his LAD and first diagonal vessel.  He appears to have good targets on all of his wounds.  His echo shows an EF of 50% with preserved RV function.  There is no significant valvular disease.  His EKGs have all shown sinus rhythms.  He is agreeable to proceed with surgical revascularization.  He is scheduled for CABG in the next few weeks.  He does have a history of prostate cancer with a tight bladder stricture.  Urology will be consulted Intra-Op for placement of a Foley catheter.     I  spent 40 minutes counseling the patient face to face.   Lajuana Matte 01/01/2021 6:27 PM

## 2020-12-30 NOTE — ED Provider Notes (Signed)
Patient seen by cardiology in consultation.  They feel that patient's symptoms not related to unstable angina but more anxiety atypical chest pain.  Patient's troponins are stable at 13.  They have provided a prescription for Klonopin and patient will proceed with follow-up for his CABG as scheduled.  We will have patient return for any new or worse symptoms.   Fredia Sorrow, MD 12/30/20 [phone]

## 2020-12-30 NOTE — ED Notes (Signed)
Confirmed with EDP Dr. Roxanne Mins about initiating previously ordered Heparin infusion for patient. Will continue to monitor.

## 2020-12-30 NOTE — Progress Notes (Signed)
Reviewed case in detail.  Personally reviewed cardiac catheterization films.  Labs reviewed and are reassuring with normal troponins.  EKG shows no acute ischemic changes.The patient came to the emergency room because he was having chest discomfort, but this was driven by his anxiety disorder.  He has been having a lot of anxiety since undergoing catheterization and being told that he needs coronary bypass surgery.  He has taken additional Klonopin to help with his anxiety and he ran out the day before yesterday.  Because he had no way to get Klonopin yesterday and was having symptoms, he came to the emergency room.  I reviewed the notes of the covering cardiology fellow last night.  The patient is examined and he has a normal physical exam and normal vital signs.  I do not think he is having unstable angina.  I spoke with the cardiac surgery office and he is scheduled to be seen this Friday for outpatient cardiac surgery consultation.  I will write him a prescription for Klonopin 0.5 mg 3 times daily x10 days with no refills.  This should get him to his surgery date.  He understands that if he has further symptoms he should come back for evaluation immediately.  I suspect he will be okay with treatment of his anxiety disorder.  He currently is chest pain-free and agrees with the plan to be discharged.  He understands instructions to avoid any strenuous physical activity.  Sherren Mocha 12/30/2020 9:18 AM

## 2020-12-30 NOTE — Progress Notes (Signed)
ANTICOAGULATION CONSULT NOTE - Initial Consult  Pharmacy Consult for Heparin  Indication: chest pain/ACS  Allergies  Allergen Reactions   Ranolazine     Chest discomfort/Reflux   Statins Other (See Comments)    myalgias    Patient Measurements: Height: 5\' 11"  (180.3 cm) Weight: 97.5 kg (215 lb) IBW/kg (Calculated) : 75.3   Vital Signs: Temp: 98.1 F (36.7 C) (10/26 0124) Temp Source: Oral (10/26 0124) BP: 159/98 (10/26 0345) Pulse Rate: 53 (10/26 0345)  Labs: Recent Labs    12/30/20 0107 12/30/20 0254  HGB 18.5*  --   HCT 54.2*  --   PLT 188  --   CREATININE 1.21  --   TROPONINIHS 13 13    Estimated Creatinine Clearance: 71.5 mL/min (by C-G formula based on SCr of 1.21 mg/dL).   Medical History: Past Medical History:  Diagnosis Date   Arthritis    Coronary artery disease    s/p prior stenting to RCA. Last cath in 05/2019 showed in-stent CTO of RCA and CTO of LCX (medical therpay recommended)   Diverticulosis    Hard of hearing    Hyperlipidemia    Hypertension    hx of elevation on lyrica, off med and now normal    Paroxysmal SVT (supraventricular tachycardia) ([facility])    Prostate cancer ([facility])    Wears glasses      Assessment: 65 y/o M to start heparin for chest pain. He was awaiting an appt with CVTS to discuss CABG for known CAD. CBC/renal function good. PTA meds reviewed.    Goal of Therapy:  Heparin level 0.3-0.7 units/ml Monitor platelets by anticoagulation protocol: Yes   Plan:  Heparin 4000 units BOLUS Start heparin drip at 1200 units/hr 1200 Heparin level Daily CBC/Heparin level Monitor for bleeding  Narda Bonds, PharmD, BCPS Clinical Pharmacist Phone: [phone]

## 2020-12-30 NOTE — Consult Note (Signed)
Cardiology Consultation:   Patient ID: Jacob Collier MRN: [id number]; DOB: [birth year]  Admit date: 12/30/2020 Date of Consult: 12/30/2020  PCP:  [name], MD   [facility] Providers Cardiologist:  [name], MD   {  Patient Profile:   Jacob Collier is a 65 y.o. male with a hx of CAD s/p prior PCI to RCA and awaiting surgical evaluation for 3v disease, essential hypertension, prior prostate CA, and anxiety who is being seen 12/30/2020 for the evaluation of chest pain at the request of Dr. Roxanne Mins.  History of Present Illness:   Jacob Collier has an extensive history of coronary disease as noted above.  Most recently he was seen in Dr. Evette Georges clinic earlier this month and reported worsening anginal chest pain.  He underwent repeat left heart catheterization on the 20th last week and was found to have extensive three-vessel coronary disease with CTO's of his right and left circumflex coronary artery.  He was referred for surgical evaluation, and has an appointment on Friday.  This morning, Jacob Collier tells me that after his catheterization he did not do much over the weekend.  He did not have much of an appetite and mostly sat around the house.  Yesterday morning, he was getting ready to go out to celebrate his upcoming birthday with his wife and friends and on the way to the car developed left sided chest discomfort.  There were no other associated symptoms with this.  It was slightly different than pain he has had in the past but similar enough that he attempted a nitroglycerin.  This did relieve the pain for approximately 30 minutes.  After returning home he checked his blood pressure numerous times, and saw systolic blood pressures between the 130s and 160s.  He continued to have episodes of similar chest discomfort that were improved with nitroglycerin but largely did not go away throughout the afternoon and evening.  He notes that during the day he was extremely anxious,  and has run out of both Klonopin and Ativan.  He believes his anxiety was certainly contributing to his chest discomfort.  This evening he tried laying down to go to sleep, but continued to feel as though he needed to check his blood pressure and worried that he "might die in his sleep."  His chest pain never fully went away and he decided to come to the emergency department for evaluation.  On arrival to the emergency department he was hemodynamically stable.  His electrocardiogram revealed sinus rhythm with conducted PACs (bigeminy) and no new ischemic changes.  His troponins were flat at 13.  There were no other significant, new lab abnormalities.  He was given a dose of nitroglycerin on arrival which did help with his chest discomfort.  When I spoke with Jacob Collier he was chest pain-free.  Past Medical History:  Diagnosis Date   Arthritis    Coronary artery disease    s/p prior stenting to RCA. Last cath in 05/2019 showed in-stent CTO of RCA and CTO of LCX (medical therpay recommended)   Diverticulosis    Hard of hearing    Hyperlipidemia    Hypertension    hx of elevation on lyrica, off med and now normal    Paroxysmal SVT (supraventricular tachycardia) ([place])    Prostate cancer ([place])    Wears glasses     Past Surgical History:  Procedure Laterality Date   CARDIAC CATHETERIZATION     with 2 stents    COLECTOMY  05/2018   EYE SURGERY  02/2020   bilaterl cataracts   LEFT HEART CATH AND CORONARY ANGIOGRAPHY N/A 12/24/2020   Procedure: LEFT HEART CATH AND CORONARY ANGIOGRAPHY;  Surgeon: Martinique, Peter M, MD;  Location: [place] [facility];  Service: Cardiovascular;  Laterality: N/A;   PROSTATECTOMY     TOTAL HIP ARTHROPLASTY Left 11/10/2015   Procedure: LEFT TOTAL HIP ARTHROPLASTY ANTERIOR APPROACH;  Surgeon: Mcarthur Rossetti, MD;  Location: [place];  Service: Orthopedics;  Laterality: Left;     Home Medications:  Prior to Admission medications   Medication Sig Start Date End Date  Taking? Authorizing Provider  amLODipine (NORVASC) 5 MG tablet Take 5 mg by mouth daily.    [provider]  clonazePAM (KLONOPIN) 0.5 MG tablet TAKE 1 TABLET (0.5 MG TOTAL) BY MOUTH 3 (THREE) TIMES DAILY AS NEEDED FOR ANXIETY. Patient taking differently: Take 0.5 mg by mouth 3 (three) times daily as needed for anxiety (arrhythmia). 11/09/20   CoxElnita Maxwell, MD  Eszopiclone 3 MG TABS TAKE 1 TABLET BY MOUTH EVERYDAY AT BEDTIME 11/24/20   [name], MD  ezetimibe (ZETIA) 10 MG tablet TAKE 1 TABLET BY MOUTH EVERY DAY 04/21/20   Troy Sine, MD  isosorbide mononitrate (IMDUR) 30 MG 24 hr tablet Take 30 mg in the morning, and 15 mg in the evening. 06/12/20   Troy Sine, MD  nitroGLYCERIN (NITROSTAT) 0.4 MG SL tablet Place 1 tablet (0.4 mg total) under the tongue every 5 (five) minutes as needed for chest pain. 08/30/19 12/23/20  Sande Rives E, PA-C  Omega-3 Fatty Acids (FISH OIL) 1200 MG CAPS Take 2,400 mg by mouth 2 (two) times daily.    [provider]    Inpatient Medications: Scheduled Meds:  heparin  4,000 Units Intravenous Once   Continuous Infusions:  heparin     PRN Meds: nitroGLYCERIN  Allergies:    Allergies  Allergen Reactions   Ranolazine     Chest discomfort/Reflux   Statins Other (See Comments)    myalgias    Social History:   Social History   Socioeconomic History   Marital status: Married    Spouse name: Not on file   Number of children: 0   Years of education: Not on file   Highest education level: Not on file  Occupational History   Occupation: Employed at [employer]  Tobacco Use   Smoking status: Never   Smokeless tobacco: Never  Vaping Use   Vaping Use: Former  Substance and Sexual Activity   Alcohol use: Not Currently    Alcohol/week: 0.0 standard drinks    Comment: rarely   Drug use: No   Sexual activity: Yes    Partners: Female    Birth control/protection: None  Other Topics Concern   Not on file  Social  History Narrative   Not on file   Social Determinants of Health   Financial Resource Strain: Not on file  Food Insecurity: Not on file  Transportation Needs: Not on file  Physical Activity: Not on file  Stress: Not on file  Social Connections: Not on file  Intimate Partner Violence: Not on file    Family History:   Family History  Problem Relation Age of Onset   Alzheimer's disease Mother    Stroke Father    Diabetes Sister    Heart attack Maternal Grandmother    Cancer Maternal Uncle        prostate   Prostate cancer Maternal Uncle  Cancer Maternal Uncle        prostate   Prostate cancer Maternal Uncle    Colon polyps Brother    Colon cancer Neg Hx    Esophageal cancer Neg Hx    Rectal cancer Neg Hx    Stomach cancer Neg Hx      ROS:  Please see the history of present illness.   All other ROS reviewed and negative.     Physical Exam/Data:   Vitals:   12/30/20 0300 12/30/20 0315 12/30/20 0330 12/30/20 0345  BP: 135/87 139/84 (!) 144/77 (!) 159/98  Pulse: (!) 59 (!) 48 (!) 55 (!) 53  Resp: 18 16 15 13   Temp:      TempSrc:      SpO2: 95% 96% 99% 98%  Weight:      Height:       No intake or output data in the 24 hours ending 12/30/20 0410 Last 3 Weights 12/30/2020 12/24/2020 12/09/2020  Weight (lbs) 215 lb 215 lb 228 lb 6.4 oz  Weight (kg) 97.523 kg 97.523 kg 103.602 kg     Body mass index is 29.99 kg/m.  General:  Well nourished, well developed, in no acute distress HEENT: normal Neck: no JVD Vascular: No carotid bruits; Distal pulses 2+ bilaterally Cardiac:  normal S1, S2; RRR; no murmur  Lungs:  clear to auscultation bilaterally, no wheezing, rhonchi or rales  Abd: soft, nontender, no hepatomegaly  Ext: no edema Musculoskeletal:  No deformities, BUE and BLE strength normal and equal Skin: warm and dry  Neuro:  CNs 2-12 intact, no focal abnormalities noted Psych:  Normal affect   EKG:  The EKG was personally reviewed and demonstrates:  sinus with  conducted PACs, inferior Qs (unchanged)  Relevant CV Studies: LHC (12/2020):   Prox LAD to Mid LAD lesion is 75% stenosed.   1st Diag lesion is 80% stenosed.   Prox Cx to Mid Cx lesion is 100% stenosed.   Prox RCA to Mid RCA lesion is 100% stenosed.   The left ventricular systolic function is normal.   LV end diastolic pressure is mildly elevated.   The left ventricular ejection fraction is 50-55% by visual estimate.   There is no aortic valve stenosis.  TTE (07/2020):  1. Left ventricular ejection fraction, by estimation, is 50 to 55%. The  left ventricle has low normal function. The left ventricle demonstrates  regional wall motion abnormalities (see scoring diagram/findings for  description). There is mild eccentric  left ventricular hypertrophy of the septal segment. Left ventricular  diastolic parameters are consistent with Grade II diastolic dysfunction  (pseudonormalization).   2. Right ventricular systolic function is normal. The right ventricular  size is mildly enlarged. There is normal pulmonary artery systolic  pressure. The estimated right ventricular systolic pressure is 78.5 mmHg.   3. Left atrial size was mildly dilated.   4. The mitral valve is normal in structure. Mild mitral valve  regurgitation. No evidence of mitral stenosis.   5. The aortic valve is grossly normal. There is mild calcification of the  aortic valve. Aortic valve regurgitation is not visualized. No aortic  stenosis is present.   6. Aortic dilatation noted. There is mild dilatation of the ascending  aorta, measuring 41 mm.   7. The inferior vena cava is normal in size with greater than 50%  respiratory variability, suggesting right atrial pressure of 3 mmHg.   Laboratory Data:  High Sensitivity Troponin:   Recent Labs  Lab 12/30/20 0107  12/30/20 0254  TROPONINIHS 13 13     Chemistry Recent Labs  Lab 12/30/20 0107  NA 134*  K 3.9  CL 104  CO2 21*  GLUCOSE 97  BUN 17  CREATININE  1.21  CALCIUM 9.4  GFRNONAA >60  ANIONGAP 9    No results for input(s): PROT, ALBUMIN, AST, ALT, ALKPHOS, BILITOT in the last 168 hours. Lipids No results for input(s): CHOL, TRIG, HDL, LABVLDL, LDLCALC, CHOLHDL in the last 168 hours.  Hematology Recent Labs  Lab 12/30/20 0107  WBC 9.2  RBC 6.07*  HGB 18.5*  HCT 54.2*  MCV 89.3  MCH 30.5  MCHC 34.1  RDW 13.7  PLT 188   Thyroid No results for input(s): TSH, FREET4 in the last 168 hours.  BNPNo results for input(s): BNP, PROBNP in the last 168 hours.  DDimer No results for input(s): DDIMER in the last 168 hours.   Radiology/Studies:  DG Chest 2 View  Result Date: 12/30/2020 CLINICAL DATA:  Chest pain. EXAM: CHEST - 2 VIEW COMPARISON:  Aug 03, 2020 FINDINGS: The heart size and mediastinal contours are within normal limits. Both lungs are clear. The visualized skeletal structures are unremarkable. IMPRESSION: No active cardiopulmonary disease. Electronically Signed   By: Virgina Norfolk M.D.   On: 12/30/2020 01:36     Assessment and Plan:   Jacob Collier presented to the emergency department late this evening with almost 12 hours of off and on chest discomfort.  He describes his discomfort as similar to his typical anginal pain although it is atypical to last this long.  It was relieved by nitroglycerin.  He is hemodynamically stable with no new ischemic changes and flat normal troponins.  Jacob Collier certainly has very extensive coronary disease and is to be seen in the cardiothoracic surgery clinic on Friday to assess his candidacy for bypass surgery.  Unquestionably some of his symptoms are anginal in nature, and it is reassuring that nitroglycerin did help with the discomfort.  I had a long discussion with Jacob Collier today about why he thought today was different, and he noted that he was especially anxious today, having been out of his Klonopin and Ativan.  I do not think he is having an acute coronary syndrome, but certainly am worried  about his symptoms.  I think it would be reasonable to continue to observe him for a couple of hours and reassess in the morning.  I discussed with him both inpatient admission with cardiothoracic evaluation versus discharge from the ED with intensification of antianginal therapy plus reinitiation of anxiolytic medications.  He favored the later but we can observe for a few more hours to ensure he remains chest pain-free.  Recommendations: - Increase Imdur to 45/30 - Cont amlodipine (sounds like BP variable at home) - Cont ASA. Cont holding plavix  - Cont zetia. Agree with PCSK9i if able - Would reinitiate klonopin +/- prn ativan  - Day team to reeval to assess suitability for d/c vs.inpatient monitoring   Risk Assessment/Risk Scores:  HEART Score (for undifferentiated chest pain):  HEAR Score: 5{  For questions or updates, please contact [place] Please consult [email or web address] for contact info under   Signed, Ronaldo Miyamoto, MD  12/30/2020 4:10 AM

## 2020-12-30 NOTE — ED Provider Notes (Addendum)
[facility] EMERGENCY DEPARTMENT Provider Note   CSN: [id number] Arrival date & time: 12/29/20  2336     History Chief Complaint  Patient presents with   Chest Pain    Jacob Collier is a 65 y.o. male.  The history is provided by the patient.  Chest Pain He has history hypertension, hyperlipidemia, coronary artery disease, polycythemia and comes in complaining of left-sided chest pain which has been present all day.  He had a recent cardiac catheterization and was being referred to cardiothoracic surgery for bypass surgery.  Pain he is having today is the same type of pain.  He gets partial, temporary relief with nitroglycerin.  Pain is worse with any exertion.  There is associated dyspnea but no nausea or diaphoresis.   Past Medical History:  Diagnosis Date   Arthritis    Coronary artery disease    s/p prior stenting to RCA. Last cath in 05/2019 showed in-stent CTO of RCA and CTO of LCX (medical therpay recommended)   Diverticulosis    Hard of hearing    Hyperlipidemia    Hypertension    hx of elevation on lyrica, off med and now normal    Paroxysmal SVT (supraventricular tachycardia) ([place])    Prostate cancer [facility])    Wears glasses     Patient Active Problem List   Diagnosis Date Noted   Angina pectoris ([place]) 12/24/2020   Hypertension 12/08/2020   Paroxysmal supraventricular tachycardia ([place]) 11/26/2020   Polycythemia 06/22/2020   Chronic arthralgias of knees and hips 05/19/2020   Myalgia due to statin 05/19/2020   Bursitis of left shoulder 11/28/2019   ED (erectile dysfunction) 10/22/2019   Sciatica without back pain, left 10/22/2019   History of total left hip replacement 10/10/2019   Primary insomnia 07/05/2019   Anxiety    Allergic rhinitis 05/16/2019   Hyperlipidemia LDL goal <70 05/16/2019   CAD (coronary artery disease) 01/18/2019   H/O heart artery stent 01/18/2019   Sigmoid stricture ([place]) 04/13/2018   Osteoarthritis of left hip  11/10/2015   Status post left hip replacement 11/10/2015   Diverticulosis 09/24/2014   Prostate cancer ([place]) 09/24/2014   Chronic back pain 09/24/2014   Dyspnea 06/06/2014   Murmur 06/06/2014    Past Surgical History:  Procedure Laterality Date   CARDIAC CATHETERIZATION     with 2 stents    COLECTOMY  05/2018   EYE SURGERY  02/2020   bilaterl cataracts   LEFT HEART CATH AND CORONARY ANGIOGRAPHY N/A 12/24/2020   Procedure: LEFT HEART CATH AND CORONARY ANGIOGRAPHY;  Surgeon: Martinique, Peter M, MD;  Location: [place] [facility];  Service: Cardiovascular;  Laterality: N/A;   PROSTATECTOMY     TOTAL HIP ARTHROPLASTY Left 11/10/2015   Procedure: LEFT TOTAL HIP ARTHROPLASTY ANTERIOR APPROACH;  Surgeon: Mcarthur Rossetti, MD;  Location: [place];  Service: Orthopedics;  Laterality: Left;       Family History  Problem Relation Age of Onset   Alzheimer's disease Mother    Stroke Father    Diabetes Sister    Heart attack Maternal Grandmother    Cancer Maternal Uncle        prostate   Prostate cancer Maternal Uncle    Cancer Maternal Uncle        prostate   Prostate cancer Maternal Uncle    Colon polyps Brother    Colon cancer Neg Hx    Esophageal cancer Neg Hx    Rectal cancer Neg Hx  Stomach cancer Neg Hx     Social History   Tobacco Use   Smoking status: Never   Smokeless tobacco: Never  Vaping Use   Vaping Use: Former  Substance Use Topics   Alcohol use: Not Currently    Alcohol/week: 0.0 standard drinks    Comment: rarely   Drug use: No    Home Medications Prior to Admission medications   Medication Sig Start Date End Date Taking? Authorizing Provider  amLODipine (NORVASC) 5 MG tablet Take 5 mg by mouth daily.    [provider]  clonazePAM (KLONOPIN) 0.5 MG tablet TAKE 1 TABLET (0.5 MG TOTAL) BY MOUTH 3 (THREE) TIMES DAILY AS NEEDED FOR ANXIETY. Patient taking differently: Take 0.5 mg by mouth 3 (three) times daily as needed for anxiety (arrhythmia).  11/09/20   CoxElnita Maxwell, MD  Eszopiclone 3 MG TABS TAKE 1 TABLET BY MOUTH EVERYDAY AT BEDTIME 11/24/20   [name], MD  ezetimibe (ZETIA) 10 MG tablet TAKE 1 TABLET BY MOUTH EVERY DAY 04/21/20   Troy Sine, MD  isosorbide mononitrate (IMDUR) 30 MG 24 hr tablet Take 30 mg in the morning, and 15 mg in the evening. 06/12/20   Troy Sine, MD  nitroGLYCERIN (NITROSTAT) 0.4 MG SL tablet Place 1 tablet (0.4 mg total) under the tongue every 5 (five) minutes as needed for chest pain. 08/30/19 12/23/20  Sande Rives E, PA-C  Omega-3 Fatty Acids (FISH OIL) 1200 MG CAPS Take 2,400 mg by mouth 2 (two) times daily.    [provider]    Allergies    Ranolazine and Statins  Review of Systems   Review of Systems  Cardiovascular:  Positive for chest pain.  All other systems reviewed and are negative.  Physical Exam Updated Vital Signs BP 135/87   Pulse (!) 59   Temp 98.1 F (36.7 C) (Oral)   Resp 18   Ht 5\' 11"  (1.803 m)   Wt 97.5 kg   SpO2 95%   BMI 29.99 kg/m   Physical Exam Vitals and nursing note reviewed.  65 year old male, resting comfortably and in no acute distress. Vital signs are significant for slightly slow heart rate and elevated blood pressure. Oxygen saturation is 97%, which is normal. Head is normocephalic and atraumatic. PERRLA, EOMI. Oropharynx is clear. Neck is nontender and supple without adenopathy or JVD. Back is nontender and there is no CVA tenderness. Lungs are clear without rales, wheezes, or rhonchi. Chest is nontender. Heart has regular rate and rhythm without murmur. Abdomen is soft, flat, nontender. Extremities have no cyanosis or edema, full range of motion is present. Skin is warm and dry without rash. Neurologic: Mental status is normal, cranial nerves are intact, moves all extremities equally.  ED Results / Procedures / Treatments   Labs (all labs ordered are listed, but only abnormal results are displayed) Labs Reviewed   BASIC METABOLIC PANEL - Abnormal; Notable for the following components:      Result Value   Sodium 134 (*)    CO2 21 (*)    All other components within normal limits  CBC WITH DIFFERENTIAL/PLATELET - Abnormal; Notable for the following components:   RBC 6.07 (*)    Hemoglobin 18.5 (*)    HCT 54.2 (*)    All other components within normal limits  RESP PANEL BY RT-PCR (FLU A&B, COVID) ARPGX2  TROPONIN I (HIGH SENSITIVITY)  TROPONIN I (HIGH SENSITIVITY)    EKG EKG Interpretation  Date/Time:  Wednesday  December 30 2020 00:36:34 EDT Ventricular Rate:  62 PR Interval:  158 QRS Duration: 110 QT Interval:  460 QTC Calculation: 466 R Axis:   32 Text Interpretation: Sinus rhythm with Premature atrial complexes in a pattern of bigeminy Cannot rule out Inferior infarct , age undetermined Cannot rule out Anterior infarct , age undetermined Abnormal ECG When compared with ECG of 08/03/2020, Premature atrial complexes are now present Confirmed by Delora Fuel ([id number]) on 12/30/2020 1:33:56 AM  Radiology DG Chest 2 View  Result Date: 12/30/2020 CLINICAL DATA:  Chest pain. EXAM: CHEST - 2 VIEW COMPARISON:  Aug 03, 2020 FINDINGS: The heart size and mediastinal contours are within normal limits. Both lungs are clear. The visualized skeletal structures are unremarkable. IMPRESSION: No active cardiopulmonary disease. Electronically Signed   By: Virgina Norfolk M.D.   On: 12/30/2020 01:36    Procedures Procedures  CRITICAL CARE Performed by: Delora Fuel Total critical care time: 40 minutes Critical care time was exclusive of separately billable procedures and treating other patients. Critical care was necessary to treat or prevent imminent or life-threatening deterioration. Critical care was time spent personally by me on the following activities: development of treatment plan with patient and/or surrogate as well as nursing, discussions with consultants, evaluation of patient's response to  treatment, examination of patient, obtaining history from patient or surrogate, ordering and performing treatments and interventions, ordering and review of laboratory studies, ordering and review of radiographic studies, pulse oximetry and re-evaluation of patient's condition.  Medications Ordered in ED Medications  nitroGLYCERIN (NITROSTAT) SL tablet 0.4 mg (0.4 mg Sublingual Given 12/30/20 0255)  heparin bolus via infusion 4,000 Units (has no administration in time range)  heparin ADULT infusion 100 units/mL (25000 units/265mL) (has no administration in time range)  aspirin chewable tablet 324 mg (324 mg Oral Given 12/30/20 0112)    ED Course  I have reviewed the triage vital signs and the nursing notes.  Pertinent labs & imaging results that were available during my care of the patient were reviewed by me and considered in my medical decision making (see chart for details).    MDM Rules/Calculators/A&P                         Chest pain which appears to be unstable angina.  Old records are reviewed confirming cardiac catheterization on 12/24/2020 showing 75% LAD stenosis, 80% first diagonal stenosis, 100% circumflex stenosis 100% right coronary artery stenosis.  LAD stenosis is new compared with prior catheterization.  He was referred to cardiothoracic surgery for bypass surgery.  Given ongoing pain, he will need to be admitted.  He is placed on heparin and nitroglycerin drips and was given oral aspirin.  Labs show stable polycythemia and normal initial troponin.  Patient still needs to be admitted with above-noted treatment in spite of normal troponin.  Case is discussed with Dr. Clayton Bibles of Cardiology service, who agrees to admit the patient.  Final Clinical Impression(s) / ED Diagnoses Final diagnoses:  Unstable angina [facility])  Polycythemia    Rx / DC Orders ED Discharge Orders     None        Delora Fuel, MD 41/32/44 [phone]  Dr. Clayton Bibles contacted me after evaluating the  patient.  He felt that there was a large component of anxiety, and patient could safely be sent home.  Decision was made to hold the patient in the emergency department to be evaluated by the cardiology rounding team.   Roxanne Mins,  Shanon Brow, MD 12/30/20 [phone]

## 2020-12-30 NOTE — Discharge Instructions (Addendum)
Follow-up as per cardiology.  Take the Klonopin as needed for anxiety.  Return for any new or worse symptoms.  Cardiology feels that today's chest pain not cardiac related and troponins have been stable.

## 2020-12-30 NOTE — ED Provider Notes (Signed)
Emergency Medicine Provider Triage Evaluation Note  Jacob Collier , a 65 y.o. male  was evaluated in triage.  Pt complains of chest pain. Pain has been constant, worsening since the afternoon. Aggravated by exertion with associated SOB. Got dizzy when he tried to walk outside to get some air. Minimal improvement with SL NTG at home; last taken at 1600. Reportedly has consultation with CT surgery to discuss CABG x 4. Pain feels similar to prior ACS episodes.  Review of Systems  Positive: Chest pain, SOB, dizziness Negative: Syncope, fever, N/V  Physical Exam  BP (!) 156/86   Pulse (!) 59   Temp 97.7 F (36.5 C)   Resp 16   Ht 5\' 11"  (1.803 m)   Wt 97.5 kg   SpO2 97%   BMI 29.99 kg/m  Gen:   Awake, no distress   Resp:  Normal effort  MSK:   Moves extremities without difficulty  Other:  Mildly anxious  Medical Decision Making  Medically screening exam initiated at 1:02 AM.  Appropriate orders placed.  Jacob Collier was informed that the remainder of the evaluation will be completed by another provider, this initial triage assessment does not replace that evaluation, and the importance of remaining in the ED until their evaluation is complete.  Unstable angina   Antonietta Breach, PA-C 53/66/44 [id number]    Delora Fuel, MD 42/59/56 [phone]

## 2020-12-30 NOTE — H&P (View-Only) (Signed)
[address]       [place] [address]             [phone]        Jacob Collier Levengood [facility] Medical Record #[id number] Date of Birth: 06/13/1955  Referring: Martinique, Peter M, MD Primary Care: [name], MD Primary Cardiologist:Thomas [name], MD  Chief Complaint:    Chief Complaint  Patient presents with   Coronary Artery Disease    Surgical consult, Cardiac Cath 12/24/20, ECHO 07/22/20    History of Present Illness:     This is a 65 year old gentleman that presents for surgical evaluation of severe three-vessel coronary artery disease.  He has a long history of chest pain and exertional shortness of breath.  Additionally he admits to use of anabolic steroids throughout most of his life.  He also has severe anxiety and when he has a panic attack he feels that this worsens his chest pain or shortness of breath.  Currently is chest pain-free.  Can't take statins On testosterone Urology consult for foley placement    Past Medical History:  Diagnosis Date   Arthritis    Coronary artery disease    s/p prior stenting to RCA. Last cath in 05/2019 showed in-stent CTO of RCA and CTO of LCX (medical therpay recommended)   Diverticulosis    Hard of hearing    Hyperlipidemia    Hypertension    hx of elevation on lyrica, off med and now normal    Paroxysmal SVT (supraventricular tachycardia) ([place])    Prostate cancer ([place])    Wears glasses     Past Surgical History:  Procedure Laterality Date   CARDIAC CATHETERIZATION     with 2 stents    COLECTOMY  05/2018   EYE SURGERY  02/2020   bilaterl cataracts   LEFT HEART CATH AND CORONARY ANGIOGRAPHY N/A 12/24/2020   Procedure: LEFT HEART CATH AND CORONARY ANGIOGRAPHY;  Surgeon: Martinique, Peter M, MD;  Location: [facility];  Service: Cardiovascular;  Laterality: N/A;   PROSTATECTOMY     TOTAL HIP ARTHROPLASTY Left 11/10/2015   Procedure: LEFT TOTAL HIP ARTHROPLASTY ANTERIOR APPROACH;   Surgeon: Mcarthur Rossetti, MD;  Location: [facility];  Service: Orthopedics;  Laterality: Left;      Social History   Tobacco Use  Smoking Status Never  Smokeless Tobacco Never    Social History   Substance and Sexual Activity  Alcohol Use Not Currently   Alcohol/week: 0.0 standard drinks   Comment: rarely     Allergies  Allergen Reactions   Ranolazine     Chest discomfort/Reflux   Statins Other (See Comments)    myalgias      Current Outpatient Medications  Medication Sig Dispense Refill   acetaminophen (TYLENOL) 500 MG tablet Take 1,000 mg by mouth every 8 (eight) hours as needed for mild pain.     amLODipine (NORVASC) 5 MG tablet Take 5 mg by mouth daily.     aspirin EC 81 MG tablet Take 81 mg by mouth daily. Swallow whole.     clonazePAM (KLONOPIN) 1 MG tablet Take 1 tablet (1 mg total) by mouth 2 (two) times daily as needed for anxiety. 15 tablet 0   clopidogrel (PLAVIX) 75 MG tablet Take 75 mg by mouth daily.     Eszopiclone 3 MG TABS TAKE 1 TABLET BY MOUTH EVERYDAY AT BEDTIME (Patient taking differently: Take 3 mg by mouth at bedtime.) 30 tablet 2  ezetimibe (ZETIA) 10 MG tablet TAKE 1 TABLET BY MOUTH EVERY DAY (Patient taking differently: Take 10 mg by mouth daily.) 90 tablet 3   isosorbide mononitrate (IMDUR) 30 MG 24 hr tablet Take 30 mg in the morning, and 15 mg in the evening. (Patient taking differently: Take 15-30 mg by mouth See admin instructions. Take 30 mg in the morning, and 15 mg in the evening.) 180 tablet 1   Omega-3 Fatty Acids (FISH OIL) 1200 MG CAPS Take 2,400 mg by mouth 2 (two) times daily.     nitroGLYCERIN (NITROSTAT) 0.4 MG SL tablet Place 1 tablet (0.4 mg total) under the tongue every 5 (five) minutes as needed for chest pain. (Patient not taking: Reported on 01/01/2021) 25 tablet 2   No current facility-administered medications for this visit.    (Not in a hospital admission)   Family History  Problem Relation Age of Onset    Alzheimer's disease Mother    Stroke Father    Diabetes Sister    Heart attack Maternal Grandmother    Cancer Maternal Uncle        prostate   Prostate cancer Maternal Uncle    Cancer Maternal Uncle        prostate   Prostate cancer Maternal Uncle    Colon polyps Brother    Colon cancer Neg Hx    Esophageal cancer Neg Hx    Rectal cancer Neg Hx    Stomach cancer Neg Hx      Review of Systems:   Review of Systems  Constitutional:  Positive for malaise/fatigue.  Respiratory:  Positive for shortness of breath.   Cardiovascular:  Positive for chest pain and leg swelling.  Genitourinary:  Positive for dysuria and frequency.  Musculoskeletal: Negative.   Neurological: Negative.      Physical Exam: BP 140/85   Pulse 62   Resp 20   Ht 5\' 11"  (1.803 m)   Wt 215 lb (97.5 kg)   SpO2 96% Comment: RA  BMI 29.99 kg/m  Physical Exam Constitutional:      Appearance: He is obese. He is not ill-appearing.  HENT:     Head: Normocephalic and atraumatic.  Cardiovascular:     Rate and Rhythm: Normal rate.  Pulmonary:     Effort: Pulmonary effort is normal. No respiratory distress.  Abdominal:     General: There is no distension.  Musculoskeletal:        General: Normal range of motion.     Cervical back: Normal range of motion.  Skin:    General: Skin is warm and dry.  Neurological:     General: No focal deficit present.     Mental Status: He is alert and oriented to person, place, and time.        I have independently reviewed the above radiologic studies and discussed with the patient   Recent Lab Findings: Lab Results  Component Value Date   WBC 9.2 12/30/2020   HGB 18.5 (H) 12/30/2020   HCT 54.2 (H) 12/30/2020   PLT 188 12/30/2020   GLUCOSE 97 12/30/2020   CHOL 181 11/16/2020   TRIG 145 11/16/2020   HDL 41 11/16/2020   LDLCALC 114 (H) 11/16/2020   ALT 23 11/16/2020   AST 16 11/16/2020   NA 134 (L) 12/30/2020   K 3.9 12/30/2020   CL 104 12/30/2020    CREATININE 1.21 12/30/2020   BUN 17 12/30/2020   CO2 21 (L) 12/30/2020   INR 1.03 06/24/2009  HGBA1C 5.8 (H) 11/28/2019      Assessment / Plan:   65 year old male with severe three-vessel coronary artery disease.  I personally reviewed his left heart cath.  Essentially he is living off of collaterals from his LAD and proximal RCA.  In regards to his inferior wall the PDA fills from right to right collateral off the proximal RCA.  The circumflex is filled via LAD collaterals.  He also has a significant stenosis in his LAD and first diagonal vessel.  He appears to have good targets on all of his wounds.  His echo shows an EF of 50% with preserved RV function.  There is no significant valvular disease.  His EKGs have all shown sinus rhythms.  He is agreeable to proceed with surgical revascularization.  He is scheduled for CABG in the next few weeks.  He does have a history of prostate cancer with a tight bladder stricture.  Urology will be consulted Intra-Op for placement of a Foley catheter.     I  spent 40 minutes counseling the patient face to face.   Lajuana Matte 01/01/2021 6:27 PM

## 2020-12-31 ENCOUNTER — Other Ambulatory Visit: Payer: Self-pay | Admitting: Legal Medicine

## 2020-12-31 DIAGNOSIS — I25110 Atherosclerotic heart disease of native coronary artery with unstable angina pectoris: Secondary | ICD-10-CM

## 2020-12-31 MED ORDER — CLONAZEPAM 1 MG PO TABS
1.0000 mg | ORAL_TABLET | Freq: Two times a day (BID) | ORAL | 0 refills | Status: DC | PRN
Start: 2020-12-31 — End: 2021-01-05

## 2020-12-31 NOTE — Progress Notes (Unsigned)
epam

## 2021-01-01 ENCOUNTER — Other Ambulatory Visit: Payer: Self-pay | Admitting: *Deleted

## 2021-01-01 ENCOUNTER — Other Ambulatory Visit: Payer: Self-pay

## 2021-01-01 ENCOUNTER — Institutional Professional Consult (permissible substitution) (INDEPENDENT_AMBULATORY_CARE_PROVIDER_SITE_OTHER): Payer: Managed Care, Other (non HMO) | Admitting: Thoracic Surgery (Cardiothoracic Vascular Surgery)

## 2021-01-01 ENCOUNTER — Other Ambulatory Visit: Payer: Self-pay | Admitting: Thoracic Surgery (Cardiothoracic Vascular Surgery)

## 2021-01-01 VITALS — BP 140/85 | HR 62 | Resp 20 | Ht 71.0 in | Wt 215.0 lb

## 2021-01-01 DIAGNOSIS — I2510 Atherosclerotic heart disease of native coronary artery without angina pectoris: Secondary | ICD-10-CM

## 2021-01-04 ENCOUNTER — Other Ambulatory Visit: Payer: Self-pay | Admitting: Family Medicine

## 2021-01-04 ENCOUNTER — Encounter: Payer: Self-pay | Admitting: *Deleted

## 2021-01-04 ENCOUNTER — Other Ambulatory Visit: Payer: Self-pay | Admitting: Cardiovascular Disease

## 2021-01-04 DIAGNOSIS — I25110 Atherosclerotic heart disease of native coronary artery with unstable angina pectoris: Secondary | ICD-10-CM

## 2021-01-05 ENCOUNTER — Other Ambulatory Visit: Payer: Self-pay | Admitting: Legal Medicine

## 2021-01-05 DIAGNOSIS — I25110 Atherosclerotic heart disease of native coronary artery with unstable angina pectoris: Secondary | ICD-10-CM

## 2021-01-07 ENCOUNTER — Telehealth: Payer: Self-pay | Admitting: Cardiovascular Disease

## 2021-01-07 ENCOUNTER — Other Ambulatory Visit: Payer: Self-pay | Admitting: Urology

## 2021-01-07 ENCOUNTER — Ambulatory Visit (INDEPENDENT_AMBULATORY_CARE_PROVIDER_SITE_OTHER): Payer: Managed Care, Other (non HMO) | Admitting: Cardiovascular Disease

## 2021-01-07 ENCOUNTER — Encounter: Payer: Self-pay | Admitting: Cardiovascular Disease

## 2021-01-07 ENCOUNTER — Other Ambulatory Visit: Payer: Self-pay

## 2021-01-07 DIAGNOSIS — Z8546 Personal history of malignant neoplasm of prostate: Secondary | ICD-10-CM | POA: Diagnosis not present

## 2021-01-07 DIAGNOSIS — N35919 Unspecified urethral stricture, male, unspecified site: Secondary | ICD-10-CM | POA: Diagnosis not present

## 2021-01-07 DIAGNOSIS — I25110 Atherosclerotic heart disease of native coronary artery with unstable angina pectoris: Secondary | ICD-10-CM

## 2021-01-07 DIAGNOSIS — J439 Emphysema, unspecified: Secondary | ICD-10-CM

## 2021-01-07 DIAGNOSIS — I471 Supraventricular tachycardia, unspecified: Secondary | ICD-10-CM

## 2021-01-07 DIAGNOSIS — N393 Stress incontinence (female) (male): Secondary | ICD-10-CM | POA: Diagnosis not present

## 2021-01-07 DIAGNOSIS — E785 Hyperlipidemia, unspecified: Secondary | ICD-10-CM | POA: Diagnosis not present

## 2021-01-07 DIAGNOSIS — R002 Palpitations: Secondary | ICD-10-CM

## 2021-01-07 DIAGNOSIS — R350 Frequency of micturition: Secondary | ICD-10-CM | POA: Diagnosis not present

## 2021-01-07 DIAGNOSIS — I2510 Atherosclerotic heart disease of native coronary artery without angina pectoris: Secondary | ICD-10-CM

## 2021-01-07 DIAGNOSIS — R079 Chest pain, unspecified: Secondary | ICD-10-CM

## 2021-01-07 DIAGNOSIS — D751 Secondary polycythemia: Secondary | ICD-10-CM

## 2021-01-07 MED ORDER — ISOSORBIDE MONONITRATE ER 60 MG PO TB24: ORAL_TABLET | ORAL | 3 refills | Status: DC

## 2021-01-07 MED ORDER — ISOSORBIDE MONONITRATE ER 30 MG PO TB24: ORAL_TABLET | ORAL | 3 refills | Status: DC

## 2021-01-07 NOTE — Progress Notes (Signed)
Cardiology Office Note    Date:  01/10/2021   ID:  Jacob Collier, DOB 27-Jan-1956, MRN [id number]  PCP:  [name], MD  Cardiologist:  [name], MD   No chief complaint on file.  5 month F/U   History of Present Illness:  Jacob Collier is a 65 y.o. male who is a former patient of Dr. Debara Pickett.  He last saw Dr. Debara Pickett in 2018.  He has known CAD since 2018 has been followed by cardiology in [facility].  He is status post PCI to the proximal to mid RCA at catheterization on January 04, 2019 and had known CTO of his circumflex.  He had undergone repeat catheterization in March 2021 showing in-stent restenosis of his RCA stent which progressed to chronic total occlusion as well as chronic total occlusion of his circumflex.  His LAD was patent.  He had been seen by his cardiologist when he presented to [facility] on June 07, 2019 requesting a second opinion.  At that time I saw him in the hospital and ultimately was able to obtain the angiographic studies that were done at [facility] for his most recent catheterization of May 25, 2019.  When that time I reviewed the angiographic films with Dr. Martinique who did not feel his CTO of his RCA was amenable to intervention due to lack of any beak large occlusion at the site of a small branch.  Complex was not amenable to intervention.  He did have some concomitant CAD.  After much discussion I reviewed the data with the patient and felt that medical therapy was the best option.  There was some concern in the past and he did not benefit from initial dosing of ranolazine.  He was discharged the following day and increased medical regimen consisting of isosorbide 30 mg, amlodipine, aspirin and Plavix in addition to his previous lisinopril.  He was bradycardic in the 50s.  He also was treated with high potency statin therapy and atorvastatin was increased to 80 mg.  He presented to the emergency room on June 11, 2019 with palpitations and  neck tightness.  EMS reported a narrow complex tachycardia at a rate of 160 bpm.  Upon arrival to the emergency room he was in sinus rhythm with PVCs and bigeminy.  He was suspected to have had very brief episode of SVT and the plan was to start Toprol-XL 25 mg daily.  I saw him after his ER evaluation in June 25, 2019.  He had  decided to continue his cardiology care with me since his hospitalization.  He has felt improved and denies any significant increasing chest pain symptoms or recurrent episodes of SVT.[name]  He has noticed development of myalgias since his increased atorvastatin dose.  He also has noticed a mild cough on lisinopril.  During that evaluation, in light of his cough sensitization I recommended switching to losartan who started him on 25 mg daily.  Due to issues with atorvastatin causing myalgias I recommended he reduce his dose to 20 mg and also recommended initiation of Zetia 10 mg.  We discussed if he continued to have increasing anginal symptomatology.  Repeat challenge with Ranexa could be undertaken.  He was subsequently evaluated by Sande Rives, PA-C in June 2021.  He had a cardiac monitor which showed predominant sinus rhythm at 65 bpm.  The slowest heart rate was sinus bradycardia at 42 bpm the fastest heart rate was sinus tachycardia 164 bpm.  He  had occasional PVCs with a ventricular couplet and several episodes of short-lived ventricular bigeminal rhythm.  There was a 4 beat episode of nonsustained VT, 92 bpm.  He also had another episode of SVT lasting 36 seconds.  I saw him in September 2021.  Since he has been on a beta-blocker therapy, he denied any recurrent tachycardia dysrhythmias.  He was on a regimen consisting of amlodipine 2.5 mg, isosorbide 30 mg but he is taking this 50 mg twice a day, losartan 25 mg, Toprol-XL 25 mg daily.  He continues to be on DAPT with aspirin/Plavix.  He is tolerating atorvastatin 20 mg and Zetia 10 mg for his hyperlipidemia.  He has noticed  some episodes of mild chest discomfort.  He denied recurrent SVT and is asked he feels this was anxiety mediated and he had been placed on low-dose Klonopin by his primary provider.   I saw him on April 01, 2020.  His palpitations are less since he has been taking Klonopin.  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  He has had urinary issues and apparently has dark urine.  He is being evaluated by Dr. Vikki Ports of urology and may require bladder sphincter surgery.  He has undergone urinalysis.  He was told perhaps that some of his dark urine may read present a metabolic issue.  He has a history of prostatectomy and radiation treatment.  He admits to significant myalgias and has been on atorvastatin 20 mg and Zetia 10 mg for hyperlipidemia.  During that evaluation, I recommended he discontinue atorvastatin and try rosuvastatin 10 mg every other day along with Zetia and attempt to achieve a target LDL goal less than 70.  He was on aspirin/Plavix for DAPT and amlodipine 5 mg, isosorbide 15 mg twice a day, metoprolol succinate 25 mg and losartan 12.5 mg for hypertension/CAD.  He denied any recent anginal symptoms.    When I saw him on June 12, 2020  he had discontinued rosuvastatin.  He works all day standing on hard concrete and he felt that both atorvastatin and rosuvastatin were contributing to some myalgias.  ESR on April 01, 2020 was normal at 9. He denied any anginal symptoms.  At times he does note some mild shortness of breath with activity.  He was unaware of palpitations.  During that evaluation, I recommended a trial of Livalo which oftentimes can be tolerated in patients with otherwise statin intolerance.  With his known CAD with CTO's of his circumflex and RCA I recommended slight titration of isosorbide recommended he take 30 mg in the morning and 60 mg at night and slightly titrated metoprolol to 37.5 mg twice a day.  I recommended he undergo a follow-up echo Doppler evaluation.  I  last saw him on August 13, 2020.  He underwent a follow-up echo Doppler study on Jul 22, 2020 which showed an EF of 50 to 31%, grade 2 diastolic dysfunction and mild eccentric LVH of the septum.  There was hypokinesis involving the anterolateral posterior and basal inferior wall.  There was mild ascending aortic dilatation at 41 mm mild left atrial dilatation.  He states that he developed an episode of angina on Aug 01, 2019 which led to ER evaluation on May 30.  Troponins were negative.  He has had issues with increased anxiety and panic attacks and had called his primary physician since he felt he needed a new prescription for his clonazepam.  He states he did not tolerate Livalo.  However his  brother also had intolerance to many statins has been able to tolerate pravastatin 80 and he suggested perhaps a trial of this may be beneficial.  Of note, recent laboratory from his evaluation revealed progressive increase in hemoglobin now at 19.3 with hematocrit of 57.3.  Platelets 197,000.  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  He denied recurrent chest pain since his angina at the end of May.  During that evaluation, I recommended he see hematology for evaluation of his polycythemia and was evaluated by Dr. Narda Rutherford of hematology/oncology.  It was felt that his polycythemia most likely was secondary to either obstructive sleep apnea or exogenous testosterone injections and laboratory was ordered.  Mr. Morelos was seen by Sande Rives in the office on December 09, 2020.  With complaints of increasing chest pain and palpitations definitive cardiac catheterization was recommended.  He was scheduled to undergo cardiac catheterization with me on December 24, 2020 but due to my family emergency, I was out of town and the procedure was done by Dr. Martinique.  Catheterization revealed severe three-vessel CAD  with 75% mid LAD stenosis, 80% first diagonal stenosis, previously noted total occlusion of the mid circumflex and proximal RCA.  Compared to his prior cath of March 2021, the LAD stenosis was new.  There was mild LV dysfunction with inferobasal hypokinesis with EF of 50%.  He was subsequently evaluated by Dr. [name] of cardiothoracic surgery on January 01, 2021 and is tentatively scheduled to undergo CABG revascularization surgery on January 19, 2021.  However, the patient has had issues with urethral stricture and is in need to undergo cystoscopy with balloon dilatation by Dr. Bjorn Loser on January 14, 2021.  He has had intolerance to statin therapy and is now on Zetia with plans to initiate Repatha following his CABG.  Presently he is on amlodipine 5 mg, isosorbide 30 mg in the morning and 50 mg in the evening in addition to metoprolol 25 mg.  He is on DAPT with aspirin/Plavix.  He presents for evaluation.   Past Medical History:  Diagnosis Date   Arthritis    Coronary artery disease    s/p prior stenting to RCA. Last cath in 05/2019 showed in-stent CTO of RCA and CTO of LCX (medical therpay recommended)   Diverticulosis    Hard of hearing    Hyperlipidemia    Hypertension    hx of elevation on lyrica, off med and now normal    Paroxysmal SVT (supraventricular tachycardia) ([place])    Prostate cancer ([place])    Wears glasses     Past Surgical History:  Procedure Laterality Date   CARDIAC CATHETERIZATION     with 2 stents    COLECTOMY  05/2018   EYE SURGERY  02/2020   bilaterl cataracts   LEFT HEART CATH AND CORONARY ANGIOGRAPHY N/A 12/24/2020   Procedure: LEFT HEART CATH AND CORONARY ANGIOGRAPHY;  Surgeon: Martinique, Peter M, MD;  Location: [facility];  Service: Cardiovascular;  Laterality: N/A;   PROSTATECTOMY     TOTAL HIP ARTHROPLASTY Left 11/10/2015   Procedure: LEFT TOTAL HIP ARTHROPLASTY ANTERIOR APPROACH;  Surgeon: Mcarthur Rossetti, MD;  Location: [facility];  Service:  Orthopedics;  Laterality: Left;    Current Medications: Outpatient Medications Prior to Visit  Medication Sig Dispense Refill   acetaminophen (TYLENOL) 500 MG tablet Take 1,000 mg by mouth every  8 (eight) hours as needed for mild pain.     amLODipine (NORVASC) 5 MG tablet Take 5 mg by mouth daily.     aspirin EC 81 MG tablet Take 81 mg by mouth daily. Swallow whole.     clonazePAM (KLONOPIN) 1 MG tablet TAKE 1 TABLET BY MOUTH 2 TIMES DAILY AS NEEDED FOR ANXIETY. 30 tablet 2   clopidogrel (PLAVIX) 75 MG tablet Take 75 mg by mouth daily. Stop taking plavix on Saturday Nov. 5, 2022     Eszopiclone 3 MG TABS TAKE 1 TABLET BY MOUTH EVERYDAY AT BEDTIME (Patient taking differently: Take 3 mg by mouth at bedtime.) 30 tablet 2   ezetimibe (ZETIA) 10 MG tablet TAKE 1 TABLET BY MOUTH EVERY DAY (Patient taking differently: Take 10 mg by mouth daily.) 90 tablet 3   metoprolol succinate (TOPROL-XL) 25 MG 24 hr tablet Take 25 mg by mouth daily.     Omega-3 Fatty Acids (FISH OIL) 1200 MG CAPS Take 2,400 mg by mouth 2 (two) times daily.     isosorbide mononitrate (IMDUR) 30 MG 24 hr tablet TAKE 1 TABLET BY MOUTH IN THE MORNING AND 1/2 TABLET IN THE EVENING 135 tablet 2   nitroGLYCERIN (NITROSTAT) 0.4 MG SL tablet Place 1 tablet (0.4 mg total) under the tongue every 5 (five) minutes as needed for chest pain. (Patient not taking: Reported on 01/01/2021) 25 tablet 2   No facility-administered medications prior to visit.     Allergies:   Ranolazine and Statins   Social History   Socioeconomic History   Marital status: Married    Spouse name: Not on file   Number of children: 0   Years of education: Not on file   Highest education level: Not on file  Occupational History   Occupation: Employed at [employer]  Tobacco Use   Smoking status: Never   Smokeless tobacco: Never  Vaping Use   Vaping Use: Former  Substance and Sexual Activity   Alcohol use: Not Currently    Alcohol/week: 0.0 standard  drinks    Comment: rarely   Drug use: No   Sexual activity: Yes    Partners: Female    Birth control/protection: None  Other Topics Concern   Not on file  Social History Narrative   Not on file   Social Determinants of Health   Financial Resource Strain: Not on file  Food Insecurity: Not on file  Transportation Needs: Not on file  Physical Activity: Not on file  Stress: Not on file  Social Connections: Not on file     Family History:  The patient's family history includes Alzheimer's disease in his mother; Cancer in his maternal uncle and maternal uncle; Colon polyps in his brother; Diabetes in his sister; Heart attack in his maternal grandmother; Prostate cancer in his maternal uncle and maternal uncle; Stroke in his father.   ROS General: Negative; No fevers, chills, or night sweats;  HEENT: Negative; No changes in vision or hearing, sinus congestion, difficulty swallowing Pulmonary: Negative; No cough, wheezing, shortness of breath, hemoptysis Cardiovascular: See HPI GI: Negative; No nausea, vomiting, diarrhea, or abdominal pain GU: h/o prostatectomy, radiation treatment.  Need for bladder sphincter surgery.  Complains of dark urine. Musculoskeletal: Myalgias Hematologic/Oncology: Polycythemia, presumed to be secondary Endocrine: Negative; no heat/cold intolerance; no diabetes Neuro: Negative; no changes in balance, headaches Skin: Negative; No rashes or skin lesions Psychiatric: History of anxiety and panic attacks Sleep: Negative; No snoring, daytime sleepiness, hypersomnolence, bruxism, restless legs, hypnogognic  hallucinations, no cataplexy Other comprehensive 14 point system review is negative.   PHYSICAL EXAM:   VS:  BP 127/80   Pulse (!) 59   Ht 5' 11"  (1.803 m)   Wt 222 lb (100.7 kg)   SpO2 96%   BMI 30.96 kg/m     Repeat blood pressure by me was 130/80  Wt Readings from Last 3 Encounters:  01/07/21 222 lb (100.7 kg)  01/01/21 215 lb (97.5 kg)   12/30/20 215 lb (97.5 kg)    General: Alert, oriented, no distress.  Skin: normal turgor, no rashes, warm and dry HEENT: Normocephalic, atraumatic. Pupils equal round and reactive to light; sclera anicteric; extraocular muscles intact;  Nose without nasal septal hypertrophy Mouth/Parynx benign; Mallinpatti scale 3 Neck: No JVD, no carotid bruits; normal carotid upstroke Lungs: clear to ausculatation and percussion; no wheezing or rales Chest wall: without tenderness to palpitation Heart: PMI not displaced, RRR, s1 s2 normal, 1/6 systolic murmur, no diastolic murmur, no rubs, gallops, thrills, or heaves Abdomen: soft, nontender; no hepatosplenomehaly, BS+; abdominal aorta nontender and not dilated by palpation. Back: no CVA tenderness Pulses 2+ Musculoskeletal: full range of motion, normal strength, no joint deformities Extremities: no clubbing cyanosis or edema, Homan's sign negative  Neurologic: grossly nonfocal; Cranial nerves grossly wnl Psychologic: Normal mood and affect   Studies/Labs Reviewed:   ECG (independently read by me):  Sinus bradycardia at 59; small inferior Q waves, no ST changes  August 13, 2020 ECG (independently read by me): sinus rhythm at 65; PAC, QTc 480 msec  June 12, 2020 ECG (independently read by me): NSR at 62; NSST changes  January 2022 ECG (independently read by me): NSR at 62; LVH, small inferior Q waves  September 2021 ECG (independently read by me):Normal sinus rhythm at 61 bpm.  Normal intervals.  No ectopy.  April 2021 ECG (independently read by me): Sinus bradycardia 54 bpm.  Small Q-wave in lead III.  No ectopy.  Nonspecific ST changes.  Recent Labs: BMP Latest Ref Rng & Units 12/30/2020 12/09/2020 11/16/2020  Glucose 70 - 99 mg/dL 97 82 92  BUN 8 - 23 mg/dL 17 17 12   Creatinine 0.61 - 1.24 mg/dL 1.21 1.11 1.12  BUN/Creat Ratio 10 - 24 - 15 11  Sodium 135 - 145 mmol/L 134(L) 137 141  Potassium 3.5 - 5.1 mmol/L 3.9 4.8 4.9  Chloride 98 - 111  mmol/L 104 102 104  CO2 22 - 32 mmol/L 21(L) 19(L) 23  Calcium 8.9 - 10.3 mg/dL 9.4 9.8 9.4     Hepatic Function Latest Ref Rng & Units 11/16/2020 09/09/2020 06/22/2020  Total Protein 6.0 - 8.5 g/dL 6.7 7.2 6.4  Albumin 3.8 - 4.8 g/dL 4.5 4.1 4.2  AST 0 - 40 IU/L 16 21 18   ALT 0 - 44 IU/L 23 25 30   Alk Phosphatase 44 - 121 IU/L 75 59 68  Total Bilirubin 0.0 - 1.2 mg/dL 0.4 0.8 0.3  Bilirubin, Direct 0.0 - 0.2 mg/dL - - -    CBC Latest Ref Rng & Units 12/30/2020 12/09/2020 09/09/2020  WBC 4.0 - 10.5 K/uL 9.2 7.3 7.1  Hemoglobin 13.0 - 17.0 g/dL 18.5(H) 18.2(H) 18.2(H)  Hematocrit 39.0 - 52.0 % 54.2(H) 53.2(H) 52.4(H)  Platelets 150 - 400 K/uL 188 186 163   Lab Results  Component Value Date   MCV 89.3 12/30/2020   MCV 88 12/09/2020   MCV 87.9 09/09/2020   No results found for: TSH Lab Results  Component Value Date  HGBA1C 5.8 (H) 11/28/2019     BNP    Component Value Date/Time   BNP 67.2 06/05/2019 2231    ProBNP No results found for: PROBNP   Lipid Panel     Component Value Date/Time   CHOL 181 11/16/2020 1138   TRIG 145 11/16/2020 1138   HDL 41 11/16/2020 1138   CHOLHDL 4.4 11/16/2020 1138   CHOLHDL 4.8 06/06/2019 0612   VLDL 29 06/06/2019 0612   LDLCALC 114 (H) 11/16/2020 1138   LABVLDL 26 11/16/2020 1138     RADIOLOGY: DG Chest 2 View  Result Date: 12/30/2020 CLINICAL DATA:  Chest pain. EXAM: CHEST - 2 VIEW COMPARISON:  Aug 03, 2020 FINDINGS: The heart size and mediastinal contours are within normal limits. Both lungs are clear. The visualized skeletal structures are unremarkable. IMPRESSION: No active cardiopulmonary disease. Electronically Signed   By: Virgina Norfolk M.D.   On: 12/30/2020 01:36   CARDIAC CATHETERIZATION  Result Date: 12/24/2020   Prox LAD to Mid LAD lesion is 75% stenosed.   1st Diag lesion is 80% stenosed.   Prox Cx to Mid Cx lesion is 100% stenosed.   Prox RCA to Mid RCA lesion is 100% stenosed.   The left ventricular systolic  function is normal.   LV end diastolic pressure is mildly elevated.   The left ventricular ejection fraction is 50-55% by visual estimate.   There is no aortic valve stenosis. 3 vessel obstructive CAD. 75% mid LAD, 80% first diagonal, 100% mid LCx and 100% proximal RCA. Compared to prior cath in March 2021 the LAD stenosis is new. Mild LV dysfunction with inferobasal HK. EF 50% Mildly elevated LVEDP Plan: needs CT surgery evaluation for CABG. Will hold Plavix. Plan to refer to Pharm D for PCSK 9 inhibitor.     Additional studies/ records that were reviewed today include:  I reviewed the patient's most recent hospitalization, and while in the hospital was able to review his outside angiographic films.    Prox LAD to Mid LAD lesion is 75% stenosed.   1st Diag lesion is 80% stenosed.   Prox Cx to Mid Cx lesion is 100% stenosed.   Prox RCA to Mid RCA lesion is 100% stenosed.   The left ventricular systolic function is normal.   LV end diastolic pressure is mildly elevated.   The left ventricular ejection fraction is 50-55% by visual estimate.   There is no aortic valve stenosis.   3 vessel obstructive CAD. 75% mid LAD, 80% first diagonal, 100% mid LCx and 100% proximal RCA. Compared to prior cath in March 2021 the LAD stenosis is new.  Mild LV dysfunction with inferobasal HK. EF 50% Mildly elevated LVEDP   Plan: needs CT surgery evaluation for CABG. Will hold Plavix. Plan to refer to Pharm D for PCSK 9 inhibitor.      ASSESSMENT:    1. Coronary artery disease involving native coronary artery of native heart with unstable angina pectoris ([place])   2. Exertional dyspnea   3. Hyperlipidemia with target LDL less than 70   4. Paroxysmal SVT (supraventricular tachycardia) ([place])   5. Polycythemia   6. History of prostate cancer   7. Palpitations   8. Stricture of male urethra, unspecified stricture type    PLAN:  Levan Aloia is a 65 year-old gentleman who has known CAD and underwent initial  PCI to his RCA in October 2020 at which time he also had known CTO of his circumflex.  He has a history  of hypertension and hyperlipidemia and subsequently developed CTO of his RCA which upon angiographic review was not amenable for attempt at intervention.  He has been documented to have SVT which has improved with initiation of metoprolol succinate.  He believes his episodes have been anxiety mediated and since he had been placed on low-dose Klonopin he is not experiencing any tachydysrhythmia.  When I saw him in April 2022 he was not having recurrent anginal symptomatology but had experienced  some increase in shortness of breath when very active at work.  In the past, there is some issue regarding Ranexa intolerance.  He experienced myalgias with atorvastatin and on a trial of every other day rosuvastatin he also felt similar symptoms and discontinued therapy.  Most recently he is only been on Zetia.  In addition, he subsequently had a trial of Livalo and also pravastatin.  Due to statin intolerance he will need initiation of PSK 9 inhibition which will be set up following his CABG revascularization.  I have reviewed his most recent cardiac catheterization which now shows new progressive disease in his LAD and diagonal vessel.  With his previous documentation of CTO's involving his RCA and circumflex, he is now scheduled to undergo elective CABG revascularization surgery with Dr. Melodie Bouillon on January 19, 2014.  Due to urethral stricture, he will need to undergo urologic cystoscopy with balloon dilatation which is scheduled for January 14, 2021 by Dr.MacDiarmid.  He will need to hold his Plavix for this procedure as well.  As result I have recommended he take his last dose of Plavix on November 5 with planned urologic cystoscopy on November 10.  He will then need to stay off Plavix in anticipation for his CABG revascularization on November 15.  I am increasing his isosorbide to 60 mg in the morning and  15 mg at night.  I have also referred him for hematologic evaluation and he will have a follow-up evaluation with Dr. Narda Rutherford in January.  I will see him in 6 weeks for reevaluation.    An erythrocyte sedimentation rate was normal.  I recommended a trial of Livalo but apparently he did not tolerate this as well.  He states his brother has tolerated pravastatin and could not tolerate other agents.  For this reason I will try initiating therapy with pravastatin 20 mg first lipid-lowering therapy.  If he fails this he may be a candidate for bempedoic acid but unfortunately this is at increased cost.  He had experienced anginal episode which may have been anxiety mediated at the end of May.  Presently he is pain-free without angina on amlodipine 5 mg daily, isosorbide and instead of taking the increased dose has only been taking 15 mg in the morning and 15 mg at night, losartan 12.5 mg, and is only been taking metoprolol succinate 12.5 mg at bedtime.  He is on omega-3 fatty acid and continues to be on DAPT with aspirin/Plavix.  He has had issues with recent panic attacks and his dose of clonidine was recently renewed by primary provider.  I am concerned with his longstanding injectable testosterone use.  His most recent laboratory from Aug 03, 2020 is now showing a hemoglobin of 19.3 and hematocrit of 57.5.  I have recommended hematologic evaluation and have suggested he see either Dr. Marin Olp or Dr. Benay Spice for evaluation.  I have suggested since he is only been taking metoprolol 12.5 mg at night and he backed off from the higher dose to at least try  to take 12.5 mg alternating with 25 mg every other night.  I am rechecking a CBC comprehensive metabolic panel TSH and lipid studies in 3 months and plan follow-up office visit at that time.    Medication Adjustments/Labs and Tests Ordered: Current medicines are reviewed at length with the patient today.  Concerns regarding medicines are outlined above.   Medication changes, Labs and Tests ordered today are listed in the Patient Instructions below. Patient Instructions  Medication Instructions:  TAKE ISOSORBIDE 60 MG (2 TABLETS) IN THE MORNING.  TAKE ISOSORBIDE 15 MG (1/2 TABLET) IN THE EVENING.  STOP TAKING PLAVIX ON Saturday, November 5  *If you need a refill on your cardiac medications before your next appointment, please call your pharmacy*   Follow-Up: At [facility], you and your health needs are our priority.  As part of our continuing mission to provide you with exceptional heart care, we have created designated Provider Care Teams.  These Care Teams include your primary Cardiologist (physician) and Advanced Practice Providers (APPs -  Physician Assistants and Nurse Practitioners) who all work together to provide you with the care you need, when you need it.  We recommend signing up for the patient portal called "MyChart".  Sign up information is provided on this After Visit Summary.  MyChart is used to connect with patients for Virtual Visits (Telemedicine).  Patients are able to view lab/test results, encounter notes, upcoming appointments, etc.  Non-urgent messages can be sent to your provider as well.   To learn more about what you can do with MyChart, go to [email or web address].    Your next appointment:   6 week(s)  The format for your next appointment:   In Person  Provider:   [name], MD      Signed, [name], MD  01/10/2021 9:35 AM    [facility] [address], [place], [place], [place]  [address] Phone: [phone]  8.I did okay

## 2021-01-07 NOTE — Telephone Encounter (Signed)
I have reached out to surgery scheduler - Pam. Pt is scheduled for CABG. Dr. Abran  note indicate urology has been consulted for intra-op foley placement. We have received a request for pre-op clearance for bladder neck dilation.   Will await clarification. Given triple vessel disease and need for CABG, patient will be high risk.   Once I get clarification, may need to reach out to his primary cardiologist. This situation may necessitate direct communication between Dr. [name], Dr. Wyvonnia Lora, and Dr. Vikki Ports.

## 2021-01-07 NOTE — Telephone Encounter (Signed)
Dr. Veatrice Kells from [facility] has clarified that this procedure needs to be done prior to CABG. Can you please fax clearance and plavix hold after your visit with him today?  I will remove from preop pool.  Thanks Angie

## 2021-01-07 NOTE — Patient Instructions (Addendum)
Medication Instructions:  TAKE ISOSORBIDE 60 MG (2 TABLETS) IN THE MORNING.  TAKE ISOSORBIDE 15 MG (1/2 TABLET) IN THE EVENING.  STOP TAKING PLAVIX ON Saturday, November 5  *If you need a refill on your cardiac medications before your next appointment, please call your pharmacy*   Follow-Up: At [facility], you and your health needs are our priority.  As part of our continuing mission to provide you with exceptional heart care, we have created designated Provider Care Teams.  These Care Teams include your primary Cardiologist (physician) and Advanced Practice Providers (APPs -  Physician Assistants and Nurse Practitioners) who all work together to provide you with the care you need, when you need it.  We recommend signing up for the patient portal called "MyChart".  Sign up information is provided on this After Visit Summary.  MyChart is used to connect with patients for Virtual Visits (Telemedicine).  Patients are able to view lab/test results, encounter notes, upcoming appointments, etc.  Non-urgent messages can be sent to your provider as well.   To learn more about what you can do with MyChart, go to [email or web address].    Your next appointment:   6 week(s)  The format for your next appointment:   In Person  Provider:   [name], MD

## 2021-01-07 NOTE — Telephone Encounter (Signed)
   [facility] Pre-operative Risk Assessment    Patient Name: Jacob Collier  DOB: [birth year] MRN: [id number]  HEARTCARE STAFF:  - IMPORTANT!!!!!! Under Visit Info/Reason for Call, type in Other and utilize the format Clearance MM/DD/YY or Clearance TBD. Do not use dashes or single digits. - Please review there is not already an duplicate clearance open for this procedure. - If request is for dental extraction, please clarify the # of teeth to be extracted. - If the patient is currently at the dentist's office, call Pre-Op Callback Staff (MA/nurse) to input urgent request.  - If the patient is not currently in the dentist office, please route to the Pre-Op pool.  Request for surgical clearance:  What type of surgery is being performed? Balloon dilation of bladder neck   When is this surgery scheduled? 01/14/21  What type of clearance is required (medical clearance vs. Pharmacy clearance to hold med vs. Both)? Both  Are there any medications that need to be held prior to surgery and how long? Plavix 5 days   Practice name and name of physician performing surgery?Dr. Vikki Ports, [facility]   What is the office phone number? [phone]   7.   What is the office fax number? [phone]   8.   Anesthesia type (None, local, MAC, general) ? General    Johnna Acosta 01/07/2021, 10:37 AM  _________________________________________________________________   (provider comments below)

## 2021-01-10 ENCOUNTER — Encounter: Payer: Self-pay | Admitting: Cardiovascular Disease

## 2021-01-12 NOTE — H&P (Signed)
Reviewed lengthy note  Patient failed Myrbetriq. He leaks a lot at work but does better at home. Twice he has been treated with antibiotics for dark-colored urine that he said is very impressive. He has had is not blood. He thinks it may be from his 7 medications for his cardiac health. They are not going to do more cardiac surgery on him. No pain.   Last 2 urinalysis completely normal. Urine sent for culture. By history recent CT scan normal   He understands any source of dark urine in the bladder I do not think is present and were not going to dilate his bladder neck just to do cystoscopy. He is going to speak to his medical doctors necks. He wants to regroup and perhaps talk about surgery in about 3 months. He will need cardiac clearance   Last culture negative. Last PSA normal   patient is leaking more now that he is more active at work. He can soak 3 pads. Sometimes 4. Uses 2 at home. No infections. VESIcare did not work  Dr. [name] is his cardiologist and he does not think he needs surgery on his heart at this stage and will continue with medical therapy   I went over the artificial urinary sphincter with diagram and handouts in detail with usual template. He understands any symptoms due to bladder overactivity like leaking when he sits in a chair or bed when he could definitely persist. He understands the concept of bladder neck contracture and the complexity of treating it if it became symptomatic. I would dilate his stricture with the balloon at the beginning of the surgery 100 blood too much would have to stop and states the treatment. He will need medical clearance and his blood thinners will need to be stopped   I sent in Bactrim DS 1 tablet twice a day for 10 days and he will start 3 days prior to surgery   Today  Reviewed notes in detail. Never had sphincter due to more health problem including heart. Still has incontinence. Frequency stable. Clinically not infected  Today patient  underwent flexible cystoscopy. Penile bulbar urethra normal. He had a scarred bladder neck. It was approximately 6-8 [place]. No foreign body.  I drew patient picture. He should undergo cystoscopy and balloon dilation. I used fluoroscopy. He had bleeding last time he had a bladder neck procedure. We do not want him on blood thinners. I will make certain that the bladder neck is open enough that he soon after he can have his heart surgery with a catheter. Recurrence rates discussed. Worsening incontinence discussed. He has had worsening incontinence after the traumatic bladder neck procedure prior. He has mixed incontinence and I will scope his bladder to make sure it is normal. He has had dark-colored urine before. I will look for changes of radiation as well.      ALLERGIES: None   MEDICATIONS: Aspirin 81 mg tablet,chewable  Metoprolol Succinate  Vesicare 5 mg tablet 1 tablet PO Daily  Viagra 100 mg tablet 1 tablet PO PRN  Acetaminophen 500 mg tablet  Amlodipine Besylate 5 mg tablet  Amlodipine Besylate 5 mg tablet  Clonazepam 1 mg tablet  Clopidogrel 75 mg tablet  Eszopiclone 2 mg tablet  Ezetimibe 10 mg tablet  Fish Oil  Imdur  Livalo  Lunesta  Nitroglycerin  Tri-Mix Papaverine HCL/Phentolamine MES/PGE 30mg -1mg -20mg /ml Dispense 10cc     GU PSH: Complex cystometrogram, w/ void pressure and urethral pressure profile studies, any technique - 2020 Complex  Uroflow - 2020 Cystoscopy - 02/06/2020 Emg surf Electrd - 2020 Inject For cystogram - 2020 Intrabd voidng Press - 2020 Remove Prostate.     NON-GU PSH: No Non-GU PSH    GU PMH: Rising PSA after prostate cancer treatment - 07/10/2020 Stress Incontinence - 07/10/2020, - 03/19/2020, - 02/06/2020 (Stable), He is very interested in proceeding with an artificial urinary sphincter. I am going to schedule him for an appointment with Dr. Matilde Sprang to discuss that surgery., - 2020 Urinary incontinence, Unspec - 03/19/2020, - 02/06/2020, We  discussed the fact that although he does have normal sphincter tone he does have some numbness involving the right side of the penis and incontinence after what sounds like a difficult sigmoid colectomy. He almost certainly has nerve damage resulting in his incontinence. This needs further evaluation with urodynamics and I have discussed the procedure with him in detail. He will undergo the study and then return to go over the results, - 2020 History of prostate cancer, He has a history of prostate cancer and had a radical prostatectomy but then had PSA recurrence treated with radiation which resulted in his PSA returning to undetectable. - 2021, (Stable), No mass was noted on rectal exam and his PSA remains undetectable., - 2020 (Stable), He had a radical prostatectomy and developed a PSA recurrence. He underwent salvage radiation and his PSA has now become detectable and stayed there despite getting back on testosterone replacement therapy. I will continue to monitor this closely., - 2019 (Stable), His PSA remains undetectable after salvage radiation despite the fact that he remains on testosterone replacement. I will continue to monitor him every 6 months with PSA., - 2019 (Stable), His PSA was checked recently and was quite low. He was pleased and I will continue to monitor his PSA., - 2018 (Worsening), He had low to intermediate risk prostate cancer at the time of his surgery but there was focal extension through the prostatic capsule into the periprosthetic fat as well as perineural invasion with reportedly negative margins and negative lymph nodes at the time of the surgery. His PSA did initially fall to undetectable. He has now developed a PSA recurrence. We discussed the options and I told him that since he has stopped using testosterone replacement one option would be to continue to observe his PSA over time. The other option, which indicated he would like to proceed with, would be to undergo radiation  therapy to the prostatic bed where he is most likely had a recurrence. We discussed the fact that having ceased using testosterone replacement after having been on it for such a long time will result in a significant fall of his serum testosterone and this actually would be beneficial during radiation therapy., - 2018 (Worsening), We discussed today the fact that his PSA should be 0 and in 1/18 it was found to be 0.3 indicating possible biochemical recurrence. He had not been informed of this and was concerned. I told him that the first thing that I would do is to repeat his PSA to be sure there has not been lab error of some form. Then if it remains detectable we discussed the options for possible imaging and local therapy., - 2018 Primary hypogonadism (Stable), His testosterone level was exceedingly high indicating that he is receiving much higher dose than he should be. He is concerned about his detectable PSA and we discussed the effect of the testosterone he is receiving on prostate cancer. He is going to decrease his dosage to 0.25  cc and then return for a recheck of his total testosterone level at mid cycle. First he does understand he may need to stop testosterone replacement altogether but he was quite symptomatic when he was hypogonadal. - 2021, (Stable), He feels much better on testosterone replacement and fortunately his PSA remains undetectable and therefore will remain on replacement therapy at this time., - 2020 (Stable), We had a discussion today about the fact that testosterone replacement could cause prostate cancer, if present, to accelerate its growth. He is well aware of this but was so profoundly symptomatic when he was off testosterone replacement he said he is willing to undergo the risk and wishes to restart testosterone replacement at 1 cc/week. I will recheck his testosterone level at mid cycle in 2 weeks and will monitor his PSA., - 2018 ED following radical prostatectomy (Stable), This  continues to respond to intracavernosal injection therapy. He will remain on this - 2019, (Stable), We are going to place intracavernosal injections on hold for now., - 2018, He has developed erectile dysfunction after his radical prostatectomy but still get some partial erections. We therefore discussed the options for treatment and he has tried and failed oral agents. We discussed intraurethral suppositories and vacuum erection device. We also discussed intracavernosal injection using papaverine and phentolamine which is less likely to cause the aching that he has experienced. Finally we did discuss penile prosthesis implantation and he said he has done a lot of research and initially wanted to consider having a prosthesis but now would like to try intracavernosal injections again not using prostaglandin which caused the penile aching in the past., - 2018      PMH Notes: Erectile dysfunction: He began having erectile dysfunction after his radical prostatectomy. He has tried Cialis and Viagra which he even took at a 200 mg dose.      NON-GU PMH: Arthritis    FAMILY HISTORY: No Family History    SOCIAL HISTORY: Marital Status: Married Preferred Language: English; Ethnicity: Not Hispanic Or Latino; Race: White Current Smoking Status: Patient does not smoke anymore.   Tobacco Use Assessment Completed: Used Tobacco in last 30 days? Does drink.  Drinks 2 caffeinated drinks per day.    REVIEW OF SYSTEMS:    GU Review Male:   Patient denies frequent urination, trouble starting your stream, have to strain to urinate , get up at night to urinate, erection problems, penile pain, hard to postpone urination, leakage of urine, stream starts and stops, and burning/ pain with urination.  Gastrointestinal (Upper):   Patient denies nausea, vomiting, and indigestion/ heartburn.  Gastrointestinal (Lower):   Patient denies diarrhea and constipation.  Constitutional:   Patient denies fever, night sweats, weight  loss, and fatigue.  Skin:   Patient denies skin rash/ lesion and itching.  Eyes:   Patient denies blurred vision and double vision.  Ears/ Nose/ Throat:   Patient denies sore throat and sinus problems.  Hematologic/Lymphatic:   Patient denies swollen glands and easy bruising.  Cardiovascular:   Patient denies leg swelling and chest pains.  Respiratory:   Patient denies cough and shortness of breath.  Endocrine:   Patient denies excessive thirst.  Musculoskeletal:   Patient denies back pain and joint pain.  Neurological:   Patient denies headaches and dizziness.  Psychologic:   Patient denies depression and anxiety.   VITAL SIGNS: None   Complexity of Data:   02/06/20 10/02/19 07/09/19 03/19/19 11/20/18 04/17/18 10/25/17 04/26/17  PSA  Total PSA 0.048 ng/mL 0.027  ng/mL <0.015 ng/mL 0.026 ng/mL <0.015 ng/mL <0.015 ng/mL <0.015 ng/mL <0.015 ng/mL    04/12/19 03/19/19 11/19/18 10/25/17 04/26/17 11/24/16  Hormones  Testosterone, Total 15.1 ng/dL >1500.0 ng/dL 863.7 ng/dL 952.7 ng/dL 994.5 ng/dL 1417.5 ng/dL    PROCEDURES:         Flexible Cystoscopy - 52000  Today patient underwent flexible cystoscopy. Penile bulbar urethra normal. He had a scarred bladder neck. It was approximately 6-8 [place]. No foreign body.Risks, benefits, and some of the potential complications of the procedure were discussed at length with the patient. All questions were answered. Informed consent was obtained. Sterile technique and intraurethral analgesia were used.  Bladder Neck:  And underwent flexible cystoscopy. Penile bulbar urethra normal. He had a scarred bladder neck. It was approximately 6-8 [place]. No foreign body.      The lower urinary tract was carefully examined. The procedure was well-tolerated and without complications. Instructions were given to call the office immediately for bloody urine, difficulty urinating, urinary retention, painful or frequent urination, fever, chills, nausea, vomiting or other  illness. The patient stated that he understood these instructions and would comply with them.         Urinalysis Dipstick Dipstick Cont'd  Color: Yellow Bilirubin: Neg mg/dL  Appearance: Clear Ketones: Neg mg/dL  Specific Gravity: 1.015 Blood: Neg ery/uL  pH: 5.5 Protein: Neg mg/dL  Glucose: Neg mg/dL Urobilinogen: 0.2 mg/dL    Nitrites: Neg    Leukocyte Esterase: Neg leu/uL    ASSESSMENT:      ICD-10 Details  1 GU:   Stress Incontinence - N39.3   2   Urinary Frequency - R35.0      PLAN:           Document Letter(s):  Created for Patient: Clinical Summary         Next Appointment:

## 2021-01-13 ENCOUNTER — Encounter (HOSPITAL_COMMUNITY): Payer: Self-pay | Admitting: Urology

## 2021-01-13 ENCOUNTER — Other Ambulatory Visit: Payer: Self-pay

## 2021-01-13 NOTE — Progress Notes (Signed)
For Short Stay: [place] appointment date: N/A Date of COVID positive in last 90 days:  N/A   For Anesthesia: PCP - [name], MD Cardiologist - Dr. [name] 01/10/21  Chest x-ray - 12/30/20 in epic EKG - 01/07/21 in epic Stress Test - greater than 2 years  ECHO - 07/23/20 in epic Cardiac Cath - 12/2020 Pacemaker/ICD device last checked: N/A  Sleep Study - N/A CPAP - N/A  Fasting Blood Sugar - N/A Checks Blood Sugar ___N/A__ times a day  Blood Thinner Instructions: Plavix recommended he take his last dose of Plavix on November 5 with planned urologic cystoscopy on November 10. Aspirin Instructions: 3 days prior Last Dose:  Activity level: Can go up a flight of stairs and activities of daily living without stopping and without chest pain and/or shortness of breath    Anesthesia review: CAD, PSVT, HTN  Patient denies shortness of breath, fever, cough and chest pain at PAT appointment   Patient verbalized understanding of instructions that were given to them at the PAT appointment. Patient was also instructed that they will need to review over the PAT instructions again at home before surgery.

## 2021-01-13 NOTE — Progress Notes (Signed)
Anesthesia Chart Review   Case: [id number] Date/Time: 01/14/21 1200   Procedure: CYSTOSCOPY WITH BALLOON  DILATATION   Anesthesia type: General   Pre-op diagnosis: BLADDER NECK CONTRACTURE   Location: [facility] ROOM 09 / [facility]   Surgeons: Bjorn Loser, MD       DISCUSSION:65 y.o. never smoker with h/o HTN, CAD (s/p DES to RCA in 01/2019 with in-stent CTO of RCA and CTO of LCX on cath in 05/2019), paroxysmal SVT, prostate cancer, bladder neck contracture scheduled for above procedure 01/14/2021 with Dr. Bjorn Loser.   Pt is scheduled to undergo elective CABG revascularization surgery with Dr. [name] on 01/19/2021.   Cardiac catheterization on 12/24/2020 showing 75% LAD stenosis, 80% first diagonal stenosis, 100% circumflex stenosis 100% right coronary artery stenosis.   Presented to the ED 12/30/20 with worsening chest pain.  Negative troponins, EKG with no acute ischemic changes, felt to be anxiety mediated.  Klonopin prescription given.   Per OV note with Dr. [name] 01/07/21, "Due to urethral stricture, he will need to undergo urologic cystoscopy with balloon dilatation which is scheduled for January 14, 2021 by Dr.MacDiarmid.  He will need to hold his Plavix for this procedure as well.  As result I have recommended he take his last dose of Plavix on November 5 with planned urologic cystoscopy on November 10.  He will then need to stay off Plavix in anticipation for his CABG revascularization on November 15."  Anticipate pt can proceed with planned procedure barring acute status change.   VS: There were no vitals taken for this visit.  PROVIDERS: [name], MD is PCP   [name], MD is Cardiologist  LABS: Labs reviewed: Acceptable for surgery. (all labs ordered are listed, but only abnormal results are displayed)  Labs Reviewed - No data to display   IMAGES:   EKG: 01/07/2021 59 bpm  Sinus bradycardia  Cannot rule out inferior infarct, age  undetermined  CV: Cardiac Cath 12/24/2020   Prox LAD to Mid LAD lesion is 75% stenosed.   1st Diag lesion is 80% stenosed.   Prox Cx to Mid Cx lesion is 100% stenosed.   Prox RCA to Mid RCA lesion is 100% stenosed.   The left ventricular systolic function is normal.   LV end diastolic pressure is mildly elevated.   The left ventricular ejection fraction is 50-55% by visual estimate.   There is no aortic valve stenosis.   3 vessel obstructive CAD. 75% mid LAD, 80% first diagonal, 100% mid LCx and 100% proximal RCA. Compared to prior cath in March 2021 the LAD stenosis is new.  Mild LV dysfunction with inferobasal HK. EF 50% Mildly elevated LVEDP   Plan: needs CT surgery evaluation for CABG. Will hold Plavix. Plan to refer to Pharm D for PCSK 9 inhibitor.   Echo 07/22/2020 1. Left ventricular ejection fraction, by estimation, is 50 to 55%. The  left ventricle has low normal function. The left ventricle demonstrates  regional wall motion abnormalities (see scoring diagram/findings for  description). There is mild eccentric  left ventricular hypertrophy of the septal segment. Left ventricular  diastolic parameters are consistent with Grade II diastolic dysfunction  (pseudonormalization).   2. Right ventricular systolic function is normal. The right ventricular  size is mildly enlarged. There is normal pulmonary artery systolic  pressure. The estimated right ventricular systolic pressure is 69.6 mmHg.   3. Left atrial size was mildly dilated.   4. The mitral valve is normal in structure. Mild mitral  valve  regurgitation. No evidence of mitral stenosis.   5. The aortic valve is grossly normal. There is mild calcification of the  aortic valve. Aortic valve regurgitation is not visualized. No aortic  stenosis is present.   6. Aortic dilatation noted. There is mild dilatation of the ascending  aorta, measuring 41 mm.   7. The inferior vena cava is normal in size with greater than 50%   respiratory variability, suggesting right atrial pressure of 3 mmHg.  Past Medical History:  Diagnosis Date   Arthritis    Coronary artery disease    s/p prior stenting to RCA. Last cath in 05/2019 showed in-stent CTO of RCA and CTO of LCX (medical therpay recommended)   Diverticulosis    Hard of hearing    Hyperlipidemia    Hypertension    hx of elevation on lyrica, off med and now normal    Paroxysmal SVT (supraventricular tachycardia) ([facility])    Prostate cancer ([facility])    Wears glasses     Past Surgical History:  Procedure Laterality Date   CARDIAC CATHETERIZATION     with 2 stents    COLECTOMY  05/2018   EYE SURGERY  02/2020   bilaterl cataracts   LEFT HEART CATH AND CORONARY ANGIOGRAPHY N/A 12/24/2020   Procedure: LEFT HEART CATH AND CORONARY ANGIOGRAPHY;  Surgeon: Martinique, Peter M, MD;  Location: [facility];  Service: Cardiovascular;  Laterality: N/A;   PROSTATECTOMY     TOTAL HIP ARTHROPLASTY Left 11/10/2015   Procedure: LEFT TOTAL HIP ARTHROPLASTY ANTERIOR APPROACH;  Surgeon: Mcarthur Rossetti, MD;  Location: [facility];  Service: Orthopedics;  Laterality: Left;    MEDICATIONS: No current facility-administered medications for this encounter.    acetaminophen (TYLENOL) 500 MG tablet   amLODipine (NORVASC) 5 MG tablet   aspirin EC 81 MG tablet   clonazePAM (KLONOPIN) 1 MG tablet   clopidogrel (PLAVIX) 75 MG tablet   Eszopiclone 3 MG TABS   ezetimibe (ZETIA) 10 MG tablet   isosorbide mononitrate (IMDUR) 30 MG 24 hr tablet   metoprolol succinate (TOPROL-XL) 25 MG 24 hr tablet   nitroGLYCERIN (NITROSTAT) 0.4 MG SL tablet   Omega-3 Fatty Acids (FISH OIL) 1200 MG CAPS     [facility], PA-C [facility] Pre-Surgical Testing [phone]

## 2021-01-13 NOTE — Anesthesia Preprocedure Evaluation (Addendum)
Anesthesia Evaluation  Patient identified by MRN, date of birth, ID band Patient awake    Reviewed: Allergy & Precautions, NPO status , Patient's Chart, lab work & pertinent test results  Airway Mallampati: II  TM Distance: >3 FB Neck ROM: Full    Dental no notable dental hx. (+) Teeth Intact   Pulmonary    Pulmonary exam normal breath sounds clear to auscultation       Cardiovascular hypertension, + angina + CAD and + Cardiac Stents (in 2020)  Normal cardiovascular exam Rhythm:Regular Rate:Normal  Cardiac catheterization on 12/24/2020 showing 75% LAD stenosis, 80% first diagonal stenosis, 100% circumflex stenosis 100% right coronary artery stenosis.   Echo 07/22/2020 1. Left ventricular ejection fraction, by estimation, is 50 to 55%. The  left ventricle has low normal function. The left ventricle demonstrates  regional wall motion abnormalities (see scoring diagram/findings for  description). There is mild eccentric  left ventricular hypertrophy of the septal segment. Left ventricular  diastolic parameters are consistent with Grade II diastolic dysfunction  (pseudonormalization).    Neuro/Psych Anxiety  Neuromuscular disease    GI/Hepatic negative GI ROS, Neg liver ROS,   Endo/Other    Renal/GU negative Renal ROS     Musculoskeletal  (+) Arthritis ,   Abdominal   Peds  Hematology   Anesthesia Other Findings All Ranolazine, Statins  Reproductive/Obstetrics                           Anesthesia Physical Anesthesia Plan  ASA: 3  Anesthesia Plan: General   Post-op Pain Management:    Induction: Intravenous  PONV Risk Score and Plan: 3 and Treatment may vary due to age or medical condition, Ondansetron and Midazolam  Airway Management Planned: Oral ETT  Additional Equipment: None  Intra-op Plan:   Post-operative Plan: Extubation in OR  Informed Consent: I have reviewed the  patients History and Physical, chart, labs and discussed the procedure including the risks, benefits and alternatives for the proposed anesthesia with the patient or authorized representative who has indicated his/her understanding and acceptance.     Dental advisory given  Plan Discussed with:   Anesthesia Plan Comments: (See PAT note 01/13/2021, Konrad Felix Ward, PA-C)      Anesthesia Quick Evaluation

## 2021-01-14 ENCOUNTER — Encounter (HOSPITAL_COMMUNITY): Payer: Self-pay | Admitting: Urology

## 2021-01-14 ENCOUNTER — Ambulatory Visit (HOSPITAL_COMMUNITY)
Admission: RE | Admit: 2021-01-14 | Discharge: 2021-01-14 | Disposition: A | Discharge status: Home / Self Care | Payer: Managed Care, Other (non HMO) | Source: Ambulatory Visit | Attending: Urology | Admitting: Urology

## 2021-01-14 ENCOUNTER — Ambulatory Visit (HOSPITAL_COMMUNITY): Payer: Managed Care, Other (non HMO)

## 2021-01-14 ENCOUNTER — Ambulatory Visit (HOSPITAL_COMMUNITY): Payer: Managed Care, Other (non HMO) | Admitting: Registered Nurse

## 2021-01-14 ENCOUNTER — Encounter (HOSPITAL_COMMUNITY)
Admission: RE | Disposition: A | Discharge status: Home / Self Care | Payer: Self-pay | Source: Ambulatory Visit | Attending: Urology

## 2021-01-14 DIAGNOSIS — I471 Supraventricular tachycardia: Secondary | ICD-10-CM | POA: Diagnosis not present

## 2021-01-14 DIAGNOSIS — N320 Bladder-neck obstruction: Secondary | ICD-10-CM | POA: Insufficient documentation

## 2021-01-14 DIAGNOSIS — G709 Myoneural disorder, unspecified: Secondary | ICD-10-CM | POA: Insufficient documentation

## 2021-01-14 DIAGNOSIS — M1990 Unspecified osteoarthritis, unspecified site: Secondary | ICD-10-CM | POA: Insufficient documentation

## 2021-01-14 DIAGNOSIS — I2510 Atherosclerotic heart disease of native coronary artery without angina pectoris: Secondary | ICD-10-CM | POA: Diagnosis not present

## 2021-01-14 DIAGNOSIS — Z955 Presence of coronary angioplasty implant and graft: Secondary | ICD-10-CM | POA: Insufficient documentation

## 2021-01-14 DIAGNOSIS — I10 Essential (primary) hypertension: Secondary | ICD-10-CM | POA: Insufficient documentation

## 2021-01-14 DIAGNOSIS — F419 Anxiety disorder, unspecified: Secondary | ICD-10-CM | POA: Diagnosis not present

## 2021-01-14 DIAGNOSIS — J309 Allergic rhinitis, unspecified: Secondary | ICD-10-CM | POA: Diagnosis not present

## 2021-01-14 DIAGNOSIS — N393 Stress incontinence (female) (male): Secondary | ICD-10-CM | POA: Insufficient documentation

## 2021-01-14 DIAGNOSIS — E785 Hyperlipidemia, unspecified: Secondary | ICD-10-CM | POA: Diagnosis not present

## 2021-01-14 HISTORY — PX: CYSTOSCOPY WITH URETHRAL DILATATION: SHX5125

## 2021-01-14 LAB — CBC
HCT: 52.8 % — ABNORMAL HIGH (ref 39.0–52.0)
Hemoglobin: 17.6 g/dL — ABNORMAL HIGH (ref 13.0–17.0)
MCH: 30.3 pg (ref 26.0–34.0)
MCHC: 33.3 g/dL (ref 30.0–36.0)
MCV: 91 fL (ref 80.0–100.0)
Platelets: 174 10*3/uL (ref 150–400)
RBC: 5.8 MIL/uL (ref 4.22–5.81)
RDW: 13.8 % (ref 11.5–15.5)
WBC: 6.5 10*3/uL (ref 4.0–10.5)
nRBC: 0 % (ref 0.0–0.2)

## 2021-01-14 LAB — BASIC METABOLIC PANEL
Anion gap: 10 (ref 5–15)
BUN: 20 mg/dL (ref 8–23)
CO2: 21 mmol/L — ABNORMAL LOW (ref 22–32)
Calcium: 9.3 mg/dL (ref 8.9–10.3)
Chloride: 106 mmol/L (ref 98–111)
Creatinine, Ser: 1.03 mg/dL (ref 0.61–1.24)
GFR, Estimated: >60 mL/min (ref >60)
Glucose, Bld: 94 mg/dL (ref 70–99)
Potassium: 4.6 mmol/L (ref 3.5–5.1)
Sodium: 137 mmol/L (ref 135–145)

## 2021-01-14 SURGERY — CYSTOSCOPY, WITH URETHRAL DILATION
Anesthesia: General

## 2021-01-14 MED ORDER — CIPROFLOXACIN IN D5W 400 MG/200ML IV SOLN
400.0000 mg | INTRAVENOUS | Status: AC
  Administered 2021-01-14: 400 mg via INTRAVENOUS

## 2021-01-14 MED ORDER — LIDOCAINE HCL (PF) 2 % IJ SOLN
INTRAMUSCULAR | Status: AC
  Filled 2021-01-14: qty 5

## 2021-01-14 MED ORDER — CHLORHEXIDINE GLUCONATE 0.12 % MT SOLN: 15.0000 mL | Freq: Once | OROMUCOSAL | Status: AC

## 2021-01-14 MED ORDER — DEXAMETHASONE SODIUM PHOSPHATE 10 MG/ML IJ SOLN
INTRAMUSCULAR | Status: DC | PRN
  Administered 2021-01-14: 8 mg via INTRAVENOUS

## 2021-01-14 MED ORDER — PROPOFOL 10 MG/ML IV BOLUS
INTRAVENOUS | Status: AC
  Filled 2021-01-14: qty 20

## 2021-01-14 MED ORDER — IOHEXOL 300 MG/ML  SOLN
INTRAMUSCULAR | Status: DC | PRN
  Administered 2021-01-14: 10 mL

## 2021-01-14 MED ORDER — ONDANSETRON HCL 4 MG/2ML IJ SOLN
INTRAMUSCULAR | Status: DC | PRN
  Administered 2021-01-14: 4 mg via INTRAVENOUS

## 2021-01-14 MED ORDER — ROCURONIUM BROMIDE 10 MG/ML (PF) SYRINGE
PREFILLED_SYRINGE | INTRAVENOUS | Status: DC | PRN
  Administered 2021-01-14: 60 mg via INTRAVENOUS

## 2021-01-14 MED ORDER — LIDOCAINE 2% (20 MG/ML) 5 ML SYRINGE
INTRAMUSCULAR | Status: DC | PRN
  Administered 2021-01-14: 100 mg via INTRAVENOUS

## 2021-01-14 MED ORDER — ROCURONIUM BROMIDE 10 MG/ML (PF) SYRINGE
PREFILLED_SYRINGE | INTRAVENOUS | Status: AC
  Filled 2021-01-14: qty 10

## 2021-01-14 MED ORDER — SUGAMMADEX SODIUM 200 MG/2ML IV SOLN
INTRAVENOUS | Status: DC | PRN
  Administered 2021-01-14: 200 mg via INTRAVENOUS

## 2021-01-14 MED ORDER — AMISULPRIDE (ANTIEMETIC) 5 MG/2ML IV SOLN: 10.0000 mg | Freq: Once | INTRAVENOUS | Status: DC | PRN

## 2021-01-14 MED ORDER — BELLADONNA ALKALOIDS-OPIUM 16.2-30 MG RE SUPP
RECTAL | Status: AC
  Filled 2021-01-14: qty 1

## 2021-01-14 MED ORDER — OXYCODONE HCL 5 MG PO TABS: 5.0000 mg | ORAL_TABLET | Freq: Once | ORAL | Status: DC | PRN

## 2021-01-14 MED ORDER — DEXAMETHASONE SODIUM PHOSPHATE 10 MG/ML IJ SOLN
INTRAMUSCULAR | Status: AC
  Filled 2021-01-14: qty 1

## 2021-01-14 MED ORDER — FENTANYL CITRATE (PF) 100 MCG/2ML IJ SOLN
INTRAMUSCULAR | Status: DC | PRN
  Administered 2021-01-14 (×2): 25 ug via INTRAVENOUS

## 2021-01-14 MED ORDER — ONDANSETRON HCL 4 MG/2ML IJ SOLN: 4.0000 mg | Freq: Once | INTRAMUSCULAR | Status: DC | PRN

## 2021-01-14 MED ORDER — OXYCODONE HCL 5 MG/5ML PO SOLN: 5.0000 mg | Freq: Once | ORAL | Status: DC | PRN

## 2021-01-14 MED ORDER — FENTANYL CITRATE (PF) 100 MCG/2ML IJ SOLN
INTRAMUSCULAR | Status: AC
  Filled 2021-01-14: qty 2

## 2021-01-14 MED ORDER — LIDOCAINE HCL URETHRAL/MUCOSAL 2 % EX GEL
CUTANEOUS | Status: AC
  Filled 2021-01-14: qty 30

## 2021-01-14 MED ORDER — ACETAMINOPHEN 10 MG/ML IV SOLN: 1000.0000 mg | Freq: Once | INTRAVENOUS | Status: DC | PRN

## 2021-01-14 MED ORDER — MIDAZOLAM HCL 2 MG/2ML IJ SOLN
INTRAMUSCULAR | Status: AC
  Filled 2021-01-14: qty 2

## 2021-01-14 MED ORDER — LACTATED RINGERS IV SOLN: INTRAVENOUS | Status: DC

## 2021-01-14 MED ORDER — MIDAZOLAM HCL 5 MG/5ML IJ SOLN
INTRAMUSCULAR | Status: DC | PRN
  Administered 2021-01-14: 2 mg via INTRAVENOUS

## 2021-01-14 MED ORDER — HYDROMORPHONE HCL 1 MG/ML IJ SOLN: 0.2500 mg | INTRAMUSCULAR | Status: DC | PRN

## 2021-01-14 MED ORDER — ORAL CARE MOUTH RINSE
15.0000 mL | Freq: Once | OROMUCOSAL | Status: AC
  Administered 2021-01-14: 15 mL via OROMUCOSAL

## 2021-01-14 MED ORDER — SODIUM CHLORIDE 0.9 % IR SOLN
Status: DC | PRN
  Administered 2021-01-14: 3000 mL via INTRAVESICAL

## 2021-01-14 MED ORDER — PROPOFOL 10 MG/ML IV BOLUS
INTRAVENOUS | Status: DC | PRN
  Administered 2021-01-14: 150 mg via INTRAVENOUS

## 2021-01-14 MED ORDER — CIPROFLOXACIN IN D5W 400 MG/200ML IV SOLN
INTRAVENOUS | Status: AC
  Filled 2021-01-14: qty 200

## 2021-01-14 MED ORDER — ONDANSETRON HCL 4 MG/2ML IJ SOLN
INTRAMUSCULAR | Status: AC
  Filled 2021-01-14: qty 2

## 2021-01-14 SURGICAL SUPPLY — 16 items
BAG URO CATCHER STRL LF (MISCELLANEOUS) ×2 IMPLANT
BALLN NEPHROSTOMY (BALLOONS) ×2
BALLOON NEPHROSTOMY (BALLOONS) ×1 IMPLANT
CATH FOLEY 2WAY SLVR  5CC 16FR (CATHETERS)
CATH FOLEY 2WAY SLVR  5CC 18FR (CATHETERS)
CATH FOLEY 2WAY SLVR 5CC 16FR (CATHETERS) IMPLANT
CATH FOLEY 2WAY SLVR 5CC 18FR (CATHETERS) IMPLANT
CLOTH BEACON ORANGE TIMEOUT ST (SAFETY) ×2 IMPLANT
GLOVE SURG ENC TEXT LTX SZ7.5 (GLOVE) ×2 IMPLANT
GOWN STRL REUS W/TWL XL LVL3 (GOWN DISPOSABLE) ×2 IMPLANT
GUIDEWIRE STR DUAL SENSOR (WIRE) ×2 IMPLANT
HOLDER FOLEY CATH W/STRAP (MISCELLANEOUS) ×2 IMPLANT
KIT TURNOVER KIT A (KITS) IMPLANT
PACK CYSTO (CUSTOM PROCEDURE TRAY) ×2 IMPLANT
PENCIL SMOKE EVACUATOR (MISCELLANEOUS) IMPLANT
TUBING CONNECTING 10 (TUBING) IMPLANT

## 2021-01-14 NOTE — Anesthesia Procedure Notes (Signed)
Procedure Name: Intubation Date/Time: 01/14/2021 12:16 PM Performed by: Victoriano Lain, CRNA Pre-anesthesia Checklist: Patient identified, Emergency Drugs available, Suction available, Patient being monitored and Timeout performed Patient Re-evaluated:Patient Re-evaluated prior to induction Oxygen Delivery Method: Circle system utilized Preoxygenation: Pre-oxygenation with 100% oxygen Induction Type: IV induction Ventilation: Mask ventilation without difficulty Laryngoscope Size: Mac and 4 Grade View: Grade I Tube type: Oral Tube size: 7.5 mm Number of attempts: 1 Airway Equipment and Method: Stylet Placement Confirmation: ETT inserted through vocal cords under direct vision, positive ETCO2 and breath sounds checked- equal and bilateral Secured at: 22 cm Tube secured with: Tape Dental Injury: Teeth and Oropharynx as per pre-operative assessment

## 2021-01-14 NOTE — Anesthesia Postprocedure Evaluation (Signed)
Anesthesia Post Note  Patient: Jacob Collier  Procedure(s) Performed: CYSTOSCOPY WITH BALLOON  DILATATION     Patient location during evaluation: PACU Anesthesia Type: General Level of consciousness: awake and alert Pain management: pain level controlled Vital Signs Assessment: post-procedure vital signs reviewed and stable Respiratory status: spontaneous breathing, nonlabored ventilation, respiratory function stable and patient connected to nasal cannula oxygen Cardiovascular status: blood pressure returned to baseline and stable Postop Assessment: no apparent nausea or vomiting Anesthetic complications: no   No notable events documented.  Last Vitals:  Vitals:   01/14/21 1315 01/14/21 1338  BP: 116/74 130/76  Pulse: (!) 54 (!) 49  Resp: 13 14  Temp: 36.7 C   SpO2: 93% 95%    Last Pain:  Vitals:   01/14/21 1338  TempSrc:   PainSc: 0-No pain                 Retaj Hilbun S

## 2021-01-14 NOTE — Op Note (Signed)
Preoperative diagnosis: Bladder neck contracture Postoperative diagnosis: Bladder neck contracture Surgery: Cystoscopy and balloon dilation of bladder neck contracture Surgeon: Dr. Nicki Reaper Kier Smead  Patient has the above diagnosis and consented the above procedure.  Preoperative antibiotics were given.  Extra care was taken with leg positioning  Penile bulbar urethra normal.  He had a rigid bladder neck.  In the office a lot the bladder neck contracture was 8 [place] but it was approximately 27 [place].  I easily placed a sensor wire through the scope under fluoroscopic guidance curling in the bladder  Under fluoroscopic guidance I advanced the balloon dilation catheter to the appropriate location.  I balloon dilated for 5 minutes to 24 [place].  Fluoroscopy was utilized to ensure good position.  I remove the balloon dilation catheter and scope the patient  The patient has approximately a 21-22 French bladder neck contracture post dilation.  It is quite rigid.  The lumen the flecks of little bit to the patient's left side with potential for catheter to catch at 9:00 but the bladder neck is definitely open.  No bleeding.  Bladder mucosa and trigone were normal.  Again there was a lot of rigidity within the bladder neck around the scope  A 16 French Foley catheter went easily.  If there was trouble during his heart surgery I would use a coud catheter that should go in nicely and even turn it towards the patient's left side a little bit.  I do not think he will have trouble.  No catheter inserted

## 2021-01-14 NOTE — Transfer of Care (Signed)
Immediate Anesthesia Transfer of Care Note  Patient: Jacob Collier  Procedure(s) Performed: CYSTOSCOPY WITH BALLOON  DILATATION  Patient Location: PACU  Anesthesia Type:General  Level of Consciousness: awake, alert , oriented and patient cooperative  Airway & Oxygen Therapy: Patient Spontanous Breathing and Patient connected to face mask oxygen  Post-op Assessment: Report given to RN, Post -op Vital signs reviewed and stable and Patient moving all extremities  Post vital signs: Reviewed and stable  Last Vitals:  Vitals Value Taken Time  BP 147/87 01/14/21 1250  Temp    Pulse 54 01/14/21 1253  Resp 20 01/14/21 1253  SpO2 97 % 01/14/21 1253  Vitals shown include unvalidated device data.  Last Pain:  Vitals:   01/14/21 1038  TempSrc: Oral         Complications: No notable events documented.

## 2021-01-14 NOTE — Discharge Instructions (Signed)
I have reviewed discharge instructions in detail with the patient. They will follow-up with me or their physician as scheduled. My nurse will also be calling the patients as per protocol.   

## 2021-01-14 NOTE — Interval H&P Note (Signed)
History and Physical Interval Note:  01/14/2021 11:46 AM  Jacob Collier  has presented today for surgery, with the diagnosis of [place].  The various methods of treatment have been discussed with the patient and family. After consideration of risks, benefits and other options for treatment, the patient has consented to  Procedure(s): CYSTOSCOPY WITH BALLOON  DILATATION (N/A) as a surgical intervention.  The patient's history has been reviewed, patient examined, no change in status, stable for surgery.  I have reviewed the patient's chart and labs.  Questions were answered to the patient's satisfaction.     Philomene Haff A Glorine Hanratty

## 2021-01-15 ENCOUNTER — Ambulatory Visit (HOSPITAL_COMMUNITY)
Admission: RE | Admit: 2021-01-15 | Discharge: 2021-01-15 | Disposition: A | Discharge status: Home / Self Care | Payer: Managed Care, Other (non HMO) | Source: Ambulatory Visit | Attending: Thoracic Surgery (Cardiothoracic Vascular Surgery) | Admitting: Thoracic Surgery (Cardiothoracic Vascular Surgery)

## 2021-01-15 ENCOUNTER — Other Ambulatory Visit: Payer: Self-pay

## 2021-01-15 ENCOUNTER — Ambulatory Visit (HOSPITAL_BASED_OUTPATIENT_CLINIC_OR_DEPARTMENT_OTHER)
Admission: RE | Admit: 2021-01-15 | Discharge: 2021-01-15 | Disposition: A | Discharge status: Home / Self Care | Payer: Managed Care, Other (non HMO) | Source: Ambulatory Visit | Attending: Thoracic Surgery (Cardiothoracic Vascular Surgery) | Admitting: Thoracic Surgery (Cardiothoracic Vascular Surgery)

## 2021-01-15 ENCOUNTER — Encounter (HOSPITAL_COMMUNITY)
Admission: RE | Admit: 2021-01-15 | Discharge: 2021-01-15 | Disposition: A | Discharge status: Home / Self Care | Payer: Managed Care, Other (non HMO) | Source: Ambulatory Visit | Attending: Thoracic Surgery (Cardiothoracic Vascular Surgery) | Admitting: Thoracic Surgery (Cardiothoracic Vascular Surgery)

## 2021-01-15 ENCOUNTER — Encounter (HOSPITAL_COMMUNITY): Payer: Self-pay | Admitting: Urology

## 2021-01-15 VITALS — BP 138/82 | HR 59 | Temp 98.4°F | Ht 71.0 in | Wt 220.5 lb

## 2021-01-15 DIAGNOSIS — Z20822 Contact with and (suspected) exposure to covid-19: Secondary | ICD-10-CM | POA: Insufficient documentation

## 2021-01-15 DIAGNOSIS — I2510 Atherosclerotic heart disease of native coronary artery without angina pectoris: Secondary | ICD-10-CM | POA: Diagnosis not present

## 2021-01-15 DIAGNOSIS — Z01812 Encounter for preprocedural laboratory examination: Secondary | ICD-10-CM | POA: Insufficient documentation

## 2021-01-15 DIAGNOSIS — Z01818 Encounter for other preprocedural examination: Secondary | ICD-10-CM

## 2021-01-15 HISTORY — DX: Dyspnea, unspecified: R06.00

## 2021-01-15 LAB — COMPREHENSIVE METABOLIC PANEL
ALT: 42 U/L (ref 0–44)
AST: 27 U/L (ref 15–41)
Albumin: 3.7 g/dL (ref 3.5–5.0)
Alkaline Phosphatase: 64 U/L (ref 38–126)
Anion gap: 10 (ref 5–15)
BUN: 18 mg/dL (ref 8–23)
CO2: 17 mmol/L — ABNORMAL LOW (ref 22–32)
Calcium: 9 mg/dL (ref 8.9–10.3)
Chloride: 109 mmol/L (ref 98–111)
Creatinine, Ser: 1.04 mg/dL (ref 0.61–1.24)
GFR, Estimated: >60 mL/min (ref >60)
Glucose, Bld: 102 mg/dL — ABNORMAL HIGH (ref 70–99)
Potassium: 4.2 mmol/L (ref 3.5–5.1)
Sodium: 136 mmol/L (ref 135–145)
Total Bilirubin: 0.6 mg/dL (ref 0.3–1.2)
Total Protein: 6.5 g/dL (ref 6.5–8.1)

## 2021-01-15 LAB — TYPE AND SCREEN
ABO/RH(D): A NEG
Antibody Screen: NEGATIVE

## 2021-01-15 LAB — URINALYSIS, ROUTINE W REFLEX MICROSCOPIC
Bilirubin Urine: NEGATIVE
Glucose, UA: NEGATIVE mg/dL
Ketones, ur: NEGATIVE mg/dL
Leukocytes,Ua: NEGATIVE
Nitrite: NEGATIVE
Protein, ur: 30 mg/dL — AB
Specific Gravity, Urine: 1.027 (ref 1.005–1.030)
pH: 5 (ref 5.0–8.0)

## 2021-01-15 LAB — SURGICAL PCR SCREEN
MRSA, PCR: NEGATIVE
Staphylococcus aureus: NEGATIVE

## 2021-01-15 LAB — HEMOGLOBIN A1C
Hgb A1c MFr Bld: 5.8 % — ABNORMAL HIGH (ref 4.8–5.6)
Mean Plasma Glucose: 119.76 mg/dL

## 2021-01-15 LAB — PROTIME-INR
INR: 1 (ref 0.8–1.2)
Prothrombin Time: 13.4 seconds (ref 11.4–15.2)

## 2021-01-15 LAB — APTT: aPTT: 35 seconds (ref 24–36)

## 2021-01-15 LAB — BLOOD GAS, ARTERIAL
Acid-base deficit: 4.4 mmol/L — ABNORMAL HIGH (ref 0.0–2.0)
Bicarbonate: 19.2 mmol/L — ABNORMAL LOW (ref 20.0–28.0)
Drawn by: 58793
FIO2: 21
O2 Saturation: 97.8 %
Patient temperature: 37
pCO2 arterial: 29.6 mmHg — ABNORMAL LOW (ref 32.0–48.0)
pH, Arterial: 7.428 (ref 7.350–7.450)
pO2, Arterial: 93.2 mmHg (ref 83.0–108.0)

## 2021-01-15 LAB — CBC
HCT: 49.1 % (ref 39.0–52.0)
Hemoglobin: 16.9 g/dL (ref 13.0–17.0)
MCH: 30.7 pg (ref 26.0–34.0)
MCHC: 34.4 g/dL (ref 30.0–36.0)
MCV: 89.1 fL (ref 80.0–100.0)
Platelets: 170 10*3/uL (ref 150–400)
RBC: 5.51 MIL/uL (ref 4.22–5.81)
RDW: 13.2 % (ref 11.5–15.5)
WBC: 14.2 10*3/uL — ABNORMAL HIGH (ref 4.0–10.5)
nRBC: 0 % (ref 0.0–0.2)

## 2021-01-15 LAB — SARS CORONAVIRUS 2 (TAT 6-24 HRS): SARS Coronavirus 2: NEGATIVE

## 2021-01-15 LAB — ECHOCARDIOGRAM COMPLETE
Area-P 1/2: 1.82 cm2
S' Lateral: 3.6 cm

## 2021-01-15 NOTE — Progress Notes (Signed)
Surgical Instructions    Your procedure is scheduled on 01/19/21.  Report to [facility] Main Entrance "A" at 5:30 A.M., then check in with the Admitting office.  Call this number if you have problems the morning of surgery:  [phone]   If you have any questions prior to your surgery date call [phone]: Open Monday-Friday 8am-4pm    Remember:  Do not eat or drink after midnight the night before your surgery     Take these medicines the morning of surgery with A SIP OF WATER:  metoprolol succinate (TOPROL-XL)  Stop Plavix on 01/09/21.   As of today, STOP taking any Aspirin (unless otherwise instructed by your surgeon) Aleve, Naproxen, Ibuprofen, Motrin, Advil, Goody's, BC's, all herbal medications, fish oil, and all vitamins.     After your COVID test   You are not required to quarantine however you are required to wear a well-fitting mask when you are out and around people not in your household.  If your mask becomes wet or soiled, replace with a new one.  Wash your hands often with soap and water for 20 seconds or clean your hands with an alcohol-based hand sanitizer that contains at least 60% alcohol.  Do not share personal items.  Notify your provider: if you are in close contact with someone who has COVID  or if you develop a fever of 100.4 or greater, sneezing, cough, sore throat, shortness of breath or body aches.           Do not wear jewelry  Do not wear lotions, powders, colognes, or deodorant. Do not shave 48 hours prior to surgery.  Men may shave face and neck. Do not bring valuables to the hospital.              [facility] is not responsible for any belongings or valuables.  Do NOT Smoke (Tobacco/Vaping)  24 hours prior to your procedure  If you use a CPAP at night, you may bring your mask for your overnight stay.   Contacts, glasses, hearing aids, dentures or partials may not be worn into surgery, please bring cases for these belongings   For  patients admitted to the hospital, discharge time will be determined by your treatment team.   Patients discharged the day of surgery will not be allowed to drive home, and someone needs to stay with them for 24 hours.  NO VISITORS WILL BE ALLOWED IN PRE-OP WHERE PATIENTS ARE PREPPED FOR SURGERY.  ONLY 1 SUPPORT PERSON MAY BE PRESENT IN THE WAITING ROOM WHILE YOU ARE IN SURGERY.  IF YOU ARE TO BE ADMITTED, ONCE YOU ARE IN YOUR ROOM YOU WILL BE ALLOWED TWO (2) VISITORS. 1 (ONE) VISITOR MAY STAY OVERNIGHT BUT MUST ARRIVE TO THE ROOM BY 8pm.  Minor children may have two parents present. Special consideration for safety and communication needs will be reviewed on a case by case basis.  Special instructions:    Oral Hygiene is also important to reduce your risk of infection.  Remember - BRUSH YOUR TEETH THE MORNING OF SURGERY WITH YOUR REGULAR TOOTHPASTE   [place]- Preparing For Surgery  Before surgery, you can play an important role. Because skin is not sterile, your skin needs to be as free of germs as possible. You can reduce the number of germs on your skin by washing with CHG (chlorahexidine gluconate) Soap before surgery.  CHG is an antiseptic cleaner which kills germs and bonds with the skin to continue killing germs  even after washing.     Please do not use if you have an allergy to CHG or antibacterial soaps. If your skin becomes reddened/irritated stop using the CHG.  Do not shave (including legs and underarms) for at least 48 hours prior to first CHG shower. It is OK to shave your face.  Please follow these instructions carefully.     Shower the NIGHT BEFORE SURGERY and the MORNING OF SURGERY with CHG Soap.   If you chose to wash your hair, wash your hair first as usual with your normal shampoo. After you shampoo, rinse your hair and body thoroughly to remove the shampoo.  Then [employer] and genitals (private parts) with your normal soap and rinse thoroughly to remove soap.  After  that Use CHG Soap as you would any other liquid soap. You can apply CHG directly to the skin and wash gently with a scrungie or a clean washcloth.   Apply the CHG Soap to your body ONLY FROM THE NECK DOWN.  Do not use on open wounds or open sores. Avoid contact with your eyes, ears, mouth and genitals (private parts). Wash Face and genitals (private parts)  with your normal soap.   Wash thoroughly, paying special attention to the area where your surgery will be performed.  Thoroughly rinse your body with warm water from the neck down.  DO NOT shower/wash with your normal soap after using and rinsing off the CHG Soap.  Pat yourself dry with a CLEAN TOWEL.  Wear CLEAN PAJAMAS to bed the night before surgery  Place CLEAN SHEETS on your bed the night before your surgery  DO NOT SLEEP WITH PETS.   Day of Surgery:  Take a shower with CHG soap. Wear Clean/Comfortable clothing the morning of surgery Do not apply any deodorants/lotions.   Remember to brush your teeth WITH YOUR REGULAR TOOTHPASTE.   Please read over the following fact sheets that you were given.

## 2021-01-15 NOTE — Progress Notes (Signed)
PCP: [name], MD Cardiologist: [name], MD  EKG: 01/07/21 CXR: 01/15/21 ECHO: 01/01/21 Stress Test: 07/12/16 Cardiac Cath: 12/24/20  Fasting Blood Sugar- na Checks Blood Sugar_na__ times a day  OSA/CPAP: No  ASA: last dose 01/09/21 Plavix: last dose 01/06/21  Covid test 01/15/21  Anesthesia Review: yes, cardiac history  Patient denies shortness of breath, fever, cough, and chest pain at PAT appointment.  Patient verbalized understanding of instructions provided today at the PAT appointment.  Patient asked to review instructions at home and day of surgery.

## 2021-01-15 NOTE — Progress Notes (Signed)
Echocardiogram 2D Echocardiogram has been performed.  Oneal Deputy Adriel Desrosier RDCS 01/15/2021, 1:27 PM

## 2021-01-15 NOTE — Progress Notes (Signed)
Pre cabg has been completed.   Preliminary results in CV Proc.   Jacob Collier 01/15/2021 1:56 PM

## 2021-01-17 ENCOUNTER — Other Ambulatory Visit: Payer: Self-pay | Admitting: Cardiovascular Disease

## 2021-01-18 MED ORDER — CEFAZOLIN SODIUM-DEXTROSE 2-4 GM/100ML-% IV SOLN
2.0000 g | INTRAVENOUS | Status: DC
  Filled 2021-01-18: qty 100

## 2021-01-18 MED ORDER — MILRINONE LACTATE IN DEXTROSE 20-5 MG/100ML-% IV SOLN
0.3000 ug/kg/min | INTRAVENOUS | Status: DC
  Filled 2021-01-18: qty 100

## 2021-01-18 MED ORDER — NITROGLYCERIN IN D5W 200-5 MCG/ML-% IV SOLN
2.0000 ug/min | INTRAVENOUS | Status: DC
  Filled 2021-01-18: qty 250

## 2021-01-18 MED ORDER — NOREPINEPHRINE 4 MG/250ML-% IV SOLN
0.0000 ug/min | INTRAVENOUS | Status: DC
  Filled 2021-01-18: qty 250

## 2021-01-18 MED ORDER — VANCOMYCIN HCL 1500 MG/300ML IV SOLN
1500.0000 mg | INTRAVENOUS | Status: AC
  Administered 2021-01-19: 1500 mg via INTRAVENOUS
  Filled 2021-01-18: qty 300

## 2021-01-18 MED ORDER — TRANEXAMIC ACID 1000 MG/10ML IV SOLN
1.5000 mg/kg/h | INTRAVENOUS | Status: AC
  Administered 2021-01-19: 1.5 mg/kg/h via INTRAVENOUS
  Filled 2021-01-18: qty 25

## 2021-01-18 MED ORDER — TRANEXAMIC ACID (OHS) BOLUS VIA INFUSION
15.0000 mg/kg | INTRAVENOUS | Status: AC
  Administered 2021-01-19: 1500 mg via INTRAVENOUS
  Filled 2021-01-18: qty 1500

## 2021-01-18 MED ORDER — TRANEXAMIC ACID (OHS) PUMP PRIME SOLUTION
2.0000 mg/kg | INTRAVENOUS | Status: DC
  Filled 2021-01-18: qty 2

## 2021-01-18 MED ORDER — PLASMA-LYTE A IV SOLN
INTRAVENOUS | Status: DC
  Filled 2021-01-18: qty 5

## 2021-01-18 MED ORDER — PHENYLEPHRINE HCL-NACL 20-0.9 MG/250ML-% IV SOLN
30.0000 ug/min | INTRAVENOUS | Status: AC
  Administered 2021-01-19: 40 ug/min via INTRAVENOUS
  Administered 2021-01-19: 25 ug/min via INTRAVENOUS
  Filled 2021-01-18: qty 250

## 2021-01-18 MED ORDER — EPINEPHRINE HCL 5 MG/250ML IV SOLN IN NS
0.0000 ug/min | INTRAVENOUS | Status: DC
  Filled 2021-01-18: qty 250

## 2021-01-18 MED ORDER — HEPARIN 30,000 UNITS/1000 ML (OHS) CELLSAVER SOLUTION
Status: DC
  Filled 2021-01-18: qty 1000

## 2021-01-18 MED ORDER — POTASSIUM CHLORIDE 2 MEQ/ML IV SOLN
80.0000 meq | INTRAVENOUS | Status: DC
  Filled 2021-01-18: qty 40

## 2021-01-18 MED ORDER — CEFAZOLIN SODIUM-DEXTROSE 2-4 GM/100ML-% IV SOLN
2.0000 g | INTRAVENOUS | Status: AC
  Administered 2021-01-19 (×2): 2 g via INTRAVENOUS
  Filled 2021-01-18: qty 100

## 2021-01-18 MED ORDER — INSULIN REGULAR(HUMAN) IN NACL 100-0.9 UT/100ML-% IV SOLN
INTRAVENOUS | Status: AC
  Administered 2021-01-19: 1.2 [IU]/h via INTRAVENOUS
  Filled 2021-01-18: qty 100

## 2021-01-18 MED ORDER — DEXMEDETOMIDINE HCL IN NACL 400 MCG/100ML IV SOLN
0.1000 ug/kg/h | INTRAVENOUS | Status: AC
  Administered 2021-01-19: .2 ug/kg/h via INTRAVENOUS
  Filled 2021-01-18: qty 100

## 2021-01-18 MED ORDER — MANNITOL 20 % IV SOLN
INTRAVENOUS | Status: DC
  Filled 2021-01-18: qty 13

## 2021-01-19 ENCOUNTER — Encounter (HOSPITAL_COMMUNITY): Payer: Self-pay | Admitting: Thoracic Surgery (Cardiothoracic Vascular Surgery)

## 2021-01-19 ENCOUNTER — Inpatient Hospital Stay (HOSPITAL_COMMUNITY): Payer: Managed Care, Other (non HMO)

## 2021-01-19 ENCOUNTER — Inpatient Hospital Stay (HOSPITAL_COMMUNITY)
Admission: RE | Admit: 2021-01-19 | Discharge: 2021-01-24 | DRG: 235 | Disposition: A | Discharge status: Home / Self Care | Payer: Managed Care, Other (non HMO) | Source: Ambulatory Visit | Attending: Thoracic Surgery (Cardiothoracic Vascular Surgery) | Admitting: Thoracic Surgery (Cardiothoracic Vascular Surgery)

## 2021-01-19 ENCOUNTER — Inpatient Hospital Stay (HOSPITAL_COMMUNITY)
Admission: RE | Disposition: A | Discharge status: Home / Self Care | Payer: Self-pay | Source: Ambulatory Visit | Attending: Thoracic Surgery (Cardiothoracic Vascular Surgery)

## 2021-01-19 ENCOUNTER — Inpatient Hospital Stay (HOSPITAL_COMMUNITY): Payer: Managed Care, Other (non HMO) | Admitting: Certified Registered"

## 2021-01-19 ENCOUNTER — Inpatient Hospital Stay (HOSPITAL_COMMUNITY): Payer: Managed Care, Other (non HMO) | Admitting: Physician Assistant

## 2021-01-19 DIAGNOSIS — I2510 Atherosclerotic heart disease of native coronary artery without angina pectoris: Secondary | ICD-10-CM | POA: Diagnosis present

## 2021-01-19 DIAGNOSIS — E8770 Fluid overload, unspecified: Secondary | ICD-10-CM | POA: Diagnosis not present

## 2021-01-19 DIAGNOSIS — Z923 Personal history of irradiation: Secondary | ICD-10-CM

## 2021-01-19 DIAGNOSIS — Z7982 Long term (current) use of aspirin: Secondary | ICD-10-CM | POA: Diagnosis not present

## 2021-01-19 DIAGNOSIS — Z96642 Presence of left artificial hip joint: Secondary | ICD-10-CM | POA: Diagnosis present

## 2021-01-19 DIAGNOSIS — N35913 Unspecified membranous urethral stricture, male: Secondary | ICD-10-CM | POA: Diagnosis not present

## 2021-01-19 DIAGNOSIS — Z9079 Acquired absence of other genital organ(s): Secondary | ICD-10-CM

## 2021-01-19 DIAGNOSIS — E871 Hypo-osmolality and hyponatremia: Secondary | ICD-10-CM | POA: Diagnosis not present

## 2021-01-19 DIAGNOSIS — Z8042 Family history of malignant neoplasm of prostate: Secondary | ICD-10-CM | POA: Diagnosis not present

## 2021-01-19 DIAGNOSIS — Z833 Family history of diabetes mellitus: Secondary | ICD-10-CM | POA: Diagnosis not present

## 2021-01-19 DIAGNOSIS — D751 Secondary polycythemia: Secondary | ICD-10-CM | POA: Diagnosis not present

## 2021-01-19 DIAGNOSIS — Z955 Presence of coronary angioplasty implant and graft: Secondary | ICD-10-CM | POA: Diagnosis not present

## 2021-01-19 DIAGNOSIS — R579 Shock, unspecified: Secondary | ICD-10-CM | POA: Diagnosis not present

## 2021-01-19 DIAGNOSIS — R570 Cardiogenic shock: Secondary | ICD-10-CM | POA: Diagnosis not present

## 2021-01-19 DIAGNOSIS — Z951 Presence of aortocoronary bypass graft: Secondary | ICD-10-CM | POA: Diagnosis not present

## 2021-01-19 DIAGNOSIS — D72828 Other elevated white blood cell count: Secondary | ICD-10-CM | POA: Diagnosis not present

## 2021-01-19 DIAGNOSIS — Z8546 Personal history of malignant neoplasm of prostate: Secondary | ICD-10-CM | POA: Diagnosis not present

## 2021-01-19 DIAGNOSIS — N3289 Other specified disorders of bladder: Secondary | ICD-10-CM | POA: Diagnosis not present

## 2021-01-19 DIAGNOSIS — Z9911 Dependence on respirator [ventilator] status: Secondary | ICD-10-CM

## 2021-01-19 DIAGNOSIS — I10 Essential (primary) hypertension: Secondary | ICD-10-CM | POA: Diagnosis present

## 2021-01-19 DIAGNOSIS — Z8249 Family history of ischemic heart disease and other diseases of the circulatory system: Secondary | ICD-10-CM

## 2021-01-19 DIAGNOSIS — Z09 Encounter for follow-up examination after completed treatment for conditions other than malignant neoplasm: Secondary | ICD-10-CM

## 2021-01-19 DIAGNOSIS — Z7902 Long term (current) use of antithrombotics/antiplatelets: Secondary | ICD-10-CM | POA: Diagnosis not present

## 2021-01-19 DIAGNOSIS — I480 Paroxysmal atrial fibrillation: Secondary | ICD-10-CM | POA: Diagnosis not present

## 2021-01-19 DIAGNOSIS — E1165 Type 2 diabetes mellitus with hyperglycemia: Secondary | ICD-10-CM | POA: Diagnosis present

## 2021-01-19 DIAGNOSIS — E785 Hyperlipidemia, unspecified: Principal | ICD-10-CM | POA: Diagnosis present

## 2021-01-19 DIAGNOSIS — Z20822 Contact with and (suspected) exposure to covid-19: Secondary | ICD-10-CM | POA: Diagnosis present

## 2021-01-19 DIAGNOSIS — N320 Bladder-neck obstruction: Secondary | ICD-10-CM | POA: Diagnosis present

## 2021-01-19 DIAGNOSIS — J9601 Acute respiratory failure with hypoxia: Secondary | ICD-10-CM | POA: Diagnosis present

## 2021-01-19 DIAGNOSIS — N35919 Unspecified urethral stricture, male, unspecified site: Secondary | ICD-10-CM

## 2021-01-19 DIAGNOSIS — Z789 Other specified health status: Secondary | ICD-10-CM | POA: Diagnosis not present

## 2021-01-19 DIAGNOSIS — F410 Panic disorder [episodic paroxysmal anxiety] without agoraphobia: Secondary | ICD-10-CM | POA: Diagnosis present

## 2021-01-19 DIAGNOSIS — I808 Phlebitis and thrombophlebitis of other sites: Secondary | ICD-10-CM | POA: Diagnosis not present

## 2021-01-19 DIAGNOSIS — J90 Pleural effusion, not elsewhere classified: Secondary | ICD-10-CM

## 2021-01-19 DIAGNOSIS — Z79899 Other long term (current) drug therapy: Secondary | ICD-10-CM

## 2021-01-19 HISTORY — PX: CORONARY ARTERY BYPASS GRAFT: SHX141

## 2021-01-19 HISTORY — PX: TEE WITHOUT CARDIOVERSION: SHX5443

## 2021-01-19 LAB — POCT I-STAT EG7
Acid-base deficit: 4 mmol/L — ABNORMAL HIGH (ref 0.0–2.0)
Bicarbonate: 22.7 mmol/L (ref 20.0–28.0)
Calcium, Ion: 1.05 mmol/L — ABNORMAL LOW (ref 1.15–1.40)
HCT: 38 % — ABNORMAL LOW (ref 39.0–52.0)
Hemoglobin: 12.9 g/dL — ABNORMAL LOW (ref 13.0–17.0)
O2 Saturation: 75 %
Potassium: 3.7 mmol/L (ref 3.5–5.1)
Sodium: 142 mmol/L (ref 135–145)
TCO2: 24 mmol/L (ref 22–32)
pCO2, Ven: 47.8 mmHg (ref 44.0–60.0)
pH, Ven: 7.285 (ref 7.250–7.430)
pO2, Ven: 45 mmHg (ref 32.0–45.0)

## 2021-01-19 LAB — POCT I-STAT 7, (LYTES, BLD GAS, ICA,H+H)
Acid-Base Excess: 0 mmol/L (ref 0.0–2.0)
Acid-base deficit: 3 mmol/L — ABNORMAL HIGH (ref 0.0–2.0)
Acid-base deficit: 2 mmol/L (ref 0.0–2.0)
Acid-base deficit: 1 mmol/L (ref 0.0–2.0)
Acid-base deficit: 3 mmol/L — ABNORMAL HIGH (ref 0.0–2.0)
Acid-base deficit: 6 mmol/L — ABNORMAL HIGH (ref 0.0–2.0)
Acid-base deficit: 5 mmol/L — ABNORMAL HIGH (ref 0.0–2.0)
Acid-base deficit: 5 mmol/L — ABNORMAL HIGH (ref 0.0–2.0)
Bicarbonate: 23.4 mmol/L (ref 20.0–28.0)
Bicarbonate: 25.4 mmol/L (ref 20.0–28.0)
Bicarbonate: 25.1 mmol/L (ref 20.0–28.0)
Bicarbonate: 24 mmol/L (ref 20.0–28.0)
Bicarbonate: 24.9 mmol/L (ref 20.0–28.0)
Bicarbonate: 20.3 mmol/L (ref 20.0–28.0)
Bicarbonate: 20 mmol/L (ref 20.0–28.0)
Bicarbonate: 19.9 mmol/L — ABNORMAL LOW (ref 20.0–28.0)
Calcium, Ion: 1.28 mmol/L (ref 1.15–1.40)
Calcium, Ion: 0.93 mmol/L — ABNORMAL LOW (ref 1.15–1.40)
Calcium, Ion: 1.07 mmol/L — ABNORMAL LOW (ref 1.15–1.40)
Calcium, Ion: 1.29 mmol/L (ref 1.15–1.40)
Calcium, Ion: 1.5 mmol/L — ABNORMAL HIGH (ref 1.15–1.40)
Calcium, Ion: 1.17 mmol/L (ref 1.15–1.40)
Calcium, Ion: 1.1 mmol/L — ABNORMAL LOW (ref 1.15–1.40)
Calcium, Ion: 1.06 mmol/L — ABNORMAL LOW (ref 1.15–1.40)
HCT: 48 % (ref 39.0–52.0)
HCT: 35 % — ABNORMAL LOW (ref 39.0–52.0)
HCT: 37 % — ABNORMAL LOW (ref 39.0–52.0)
HCT: 37 % — ABNORMAL LOW (ref 39.0–52.0)
HCT: 38 % — ABNORMAL LOW (ref 39.0–52.0)
HCT: 39 % (ref 39.0–52.0)
HCT: 42 % (ref 39.0–52.0)
HCT: 40 % (ref 39.0–52.0)
Hemoglobin: 16.3 g/dL (ref 13.0–17.0)
Hemoglobin: 11.9 g/dL — ABNORMAL LOW (ref 13.0–17.0)
Hemoglobin: 12.6 g/dL — ABNORMAL LOW (ref 13.0–17.0)
Hemoglobin: 12.6 g/dL — ABNORMAL LOW (ref 13.0–17.0)
Hemoglobin: 12.9 g/dL — ABNORMAL LOW (ref 13.0–17.0)
Hemoglobin: 13.3 g/dL (ref 13.0–17.0)
Hemoglobin: 14.3 g/dL (ref 13.0–17.0)
Hemoglobin: 13.6 g/dL (ref 13.0–17.0)
O2 Saturation: 99 %
O2 Saturation: 100 %
O2 Saturation: 100 %
O2 Saturation: 100 %
O2 Saturation: 97 %
O2 Saturation: 95 %
O2 Saturation: 97 %
O2 Saturation: 97 %
Patient temperature: 35.7
Patient temperature: 37.1
Patient temperature: 37.8
Potassium: 3.9 mmol/L (ref 3.5–5.1)
Potassium: 4.4 mmol/L (ref 3.5–5.1)
Potassium: 4.5 mmol/L (ref 3.5–5.1)
Potassium: 4.1 mmol/L (ref 3.5–5.1)
Potassium: 4.4 mmol/L (ref 3.5–5.1)
Potassium: 3.9 mmol/L (ref 3.5–5.1)
Potassium: 4.5 mmol/L (ref 3.5–5.1)
Potassium: 4.4 mmol/L (ref 3.5–5.1)
Sodium: 138 mmol/L (ref 135–145)
Sodium: 139 mmol/L (ref 135–145)
Sodium: 137 mmol/L (ref 135–145)
Sodium: 136 mmol/L (ref 135–145)
Sodium: 138 mmol/L (ref 135–145)
Sodium: 141 mmol/L (ref 135–145)
Sodium: 139 mmol/L (ref 135–145)
Sodium: 140 mmol/L (ref 135–145)
TCO2: 25 mmol/L (ref 22–32)
TCO2: 27 mmol/L (ref 22–32)
TCO2: 26 mmol/L (ref 22–32)
TCO2: 25 mmol/L (ref 22–32)
TCO2: 26 mmol/L (ref 22–32)
TCO2: 22 mmol/L (ref 22–32)
TCO2: 21 mmol/L — ABNORMAL LOW (ref 22–32)
TCO2: 21 mmol/L — ABNORMAL LOW (ref 22–32)
pCO2 arterial: 45.5 mmHg (ref 32.0–48.0)
pCO2 arterial: 52.1 mmHg — ABNORMAL HIGH (ref 32.0–48.0)
pCO2 arterial: 42.6 mmHg (ref 32.0–48.0)
pCO2 arterial: 44.9 mmHg (ref 32.0–48.0)
pCO2 arterial: 47.4 mmHg (ref 32.0–48.0)
pCO2 arterial: 40.4 mmHg (ref 32.0–48.0)
pCO2 arterial: 36.5 mmHg (ref 32.0–48.0)
pCO2 arterial: 35.9 mmHg (ref 32.0–48.0)
pH, Arterial: 7.319 — ABNORMAL LOW (ref 7.350–7.450)
pH, Arterial: 7.295 — ABNORMAL LOW (ref 7.350–7.450)
pH, Arterial: 7.377 (ref 7.350–7.450)
pH, Arterial: 7.312 — ABNORMAL LOW (ref 7.350–7.450)
pH, Arterial: 7.351 (ref 7.350–7.450)
pH, Arterial: 7.303 — ABNORMAL LOW (ref 7.350–7.450)
pH, Arterial: 7.347 — ABNORMAL LOW (ref 7.350–7.450)
pH, Arterial: 7.355 (ref 7.350–7.450)
pO2, Arterial: 170 mmHg — ABNORMAL HIGH (ref 83.0–108.0)
pO2, Arterial: 368 mmHg — ABNORMAL HIGH (ref 83.0–108.0)
pO2, Arterial: 405 mmHg — ABNORMAL HIGH (ref 83.0–108.0)
pO2, Arterial: 104 mmHg (ref 83.0–108.0)
pO2, Arterial: 353 mmHg — ABNORMAL HIGH (ref 83.0–108.0)
pO2, Arterial: 79 mmHg — ABNORMAL LOW (ref 83.0–108.0)
pO2, Arterial: 100 mmHg (ref 83.0–108.0)
pO2, Arterial: 103 mmHg (ref 83.0–108.0)

## 2021-01-19 LAB — CBC
HCT: 40.8 % (ref 39.0–52.0)
HCT: 42.6 % (ref 39.0–52.0)
Hemoglobin: 13.9 g/dL (ref 13.0–17.0)
Hemoglobin: 14.7 g/dL (ref 13.0–17.0)
MCH: 30.3 pg (ref 26.0–34.0)
MCH: 30.5 pg (ref 26.0–34.0)
MCHC: 34.1 g/dL (ref 30.0–36.0)
MCHC: 34.5 g/dL (ref 30.0–36.0)
MCV: 89.1 fL (ref 80.0–100.0)
MCV: 88.4 fL (ref 80.0–100.0)
Platelets: 102 10*3/uL — ABNORMAL LOW (ref 150–400)
Platelets: 135 10*3/uL — ABNORMAL LOW (ref 150–400)
RBC: 4.58 MIL/uL (ref 4.22–5.81)
RBC: 4.82 MIL/uL (ref 4.22–5.81)
RDW: 13.2 % (ref 11.5–15.5)
RDW: 13.1 % (ref 11.5–15.5)
WBC: 12.1 10*3/uL — ABNORMAL HIGH (ref 4.0–10.5)
WBC: 13.2 10*3/uL — ABNORMAL HIGH (ref 4.0–10.5)
nRBC: 0 % (ref 0.0–0.2)
nRBC: 0 % (ref 0.0–0.2)

## 2021-01-19 LAB — POCT I-STAT, CHEM 8
BUN: 20 mg/dL (ref 8–23)
BUN: 18 mg/dL (ref 8–23)
BUN: 18 mg/dL (ref 8–23)
BUN: 17 mg/dL (ref 8–23)
BUN: 18 mg/dL (ref 8–23)
Calcium, Ion: 1.24 mmol/L (ref 1.15–1.40)
Calcium, Ion: 1.23 mmol/L (ref 1.15–1.40)
Calcium, Ion: 1.07 mmol/L — ABNORMAL LOW (ref 1.15–1.40)
Calcium, Ion: 1.06 mmol/L — ABNORMAL LOW (ref 1.15–1.40)
Calcium, Ion: 1.28 mmol/L (ref 1.15–1.40)
Chloride: 105 mmol/L (ref 98–111)
Chloride: 105 mmol/L (ref 98–111)
Chloride: 104 mmol/L (ref 98–111)
Chloride: 104 mmol/L (ref 98–111)
Chloride: 105 mmol/L (ref 98–111)
Creatinine, Ser: 0.9 mg/dL (ref 0.61–1.24)
Creatinine, Ser: 0.9 mg/dL (ref 0.61–1.24)
Creatinine, Ser: 0.8 mg/dL (ref 0.61–1.24)
Creatinine, Ser: 0.8 mg/dL (ref 0.61–1.24)
Creatinine, Ser: 0.8 mg/dL (ref 0.61–1.24)
Glucose, Bld: 120 mg/dL — ABNORMAL HIGH (ref 70–99)
Glucose, Bld: 112 mg/dL — ABNORMAL HIGH (ref 70–99)
Glucose, Bld: 122 mg/dL — ABNORMAL HIGH (ref 70–99)
Glucose, Bld: 153 mg/dL — ABNORMAL HIGH (ref 70–99)
Glucose, Bld: 157 mg/dL — ABNORMAL HIGH (ref 70–99)
HCT: 50 % (ref 39.0–52.0)
HCT: 47 % (ref 39.0–52.0)
HCT: 36 % — ABNORMAL LOW (ref 39.0–52.0)
HCT: 39 % (ref 39.0–52.0)
HCT: 40 % (ref 39.0–52.0)
Hemoglobin: 17 g/dL (ref 13.0–17.0)
Hemoglobin: 16 g/dL (ref 13.0–17.0)
Hemoglobin: 12.2 g/dL — ABNORMAL LOW (ref 13.0–17.0)
Hemoglobin: 13.3 g/dL (ref 13.0–17.0)
Hemoglobin: 13.6 g/dL (ref 13.0–17.0)
Potassium: 3.9 mmol/L (ref 3.5–5.1)
Potassium: 4.1 mmol/L (ref 3.5–5.1)
Potassium: 4.5 mmol/L (ref 3.5–5.1)
Potassium: 4.1 mmol/L (ref 3.5–5.1)
Potassium: 4.4 mmol/L (ref 3.5–5.1)
Sodium: 139 mmol/L (ref 135–145)
Sodium: 139 mmol/L (ref 135–145)
Sodium: 137 mmol/L (ref 135–145)
Sodium: 137 mmol/L (ref 135–145)
Sodium: 139 mmol/L (ref 135–145)
TCO2: 24 mmol/L (ref 22–32)
TCO2: 22 mmol/L (ref 22–32)
TCO2: 25 mmol/L (ref 22–32)
TCO2: 22 mmol/L (ref 22–32)
TCO2: 24 mmol/L (ref 22–32)

## 2021-01-19 LAB — GLUCOSE, CAPILLARY
Glucose-Capillary: 125 mg/dL — ABNORMAL HIGH (ref 70–99)
Glucose-Capillary: 133 mg/dL — ABNORMAL HIGH (ref 70–99)
Glucose-Capillary: 141 mg/dL — ABNORMAL HIGH (ref 70–99)
Glucose-Capillary: 131 mg/dL — ABNORMAL HIGH (ref 70–99)
Glucose-Capillary: 122 mg/dL — ABNORMAL HIGH (ref 70–99)
Glucose-Capillary: 142 mg/dL — ABNORMAL HIGH (ref 70–99)
Glucose-Capillary: 98 mg/dL (ref 70–99)
Glucose-Capillary: 79 mg/dL (ref 70–99)
Glucose-Capillary: 173 mg/dL — ABNORMAL HIGH (ref 70–99)
Glucose-Capillary: 153 mg/dL — ABNORMAL HIGH (ref 70–99)

## 2021-01-19 LAB — BASIC METABOLIC PANEL
Anion gap: 6 (ref 5–15)
BUN: 12 mg/dL (ref 8–23)
CO2: 18 mmol/L — ABNORMAL LOW (ref 22–32)
Calcium: 7.2 mg/dL — ABNORMAL LOW (ref 8.9–10.3)
Chloride: 112 mmol/L — ABNORMAL HIGH (ref 98–111)
Creatinine, Ser: 0.81 mg/dL (ref 0.61–1.24)
GFR, Estimated: >60 mL/min (ref >60)
Glucose, Bld: 118 mg/dL — ABNORMAL HIGH (ref 70–99)
Potassium: 4.1 mmol/L (ref 3.5–5.1)
Sodium: 136 mmol/L (ref 135–145)

## 2021-01-19 LAB — PLATELET COUNT: Platelets: 148 10*3/uL — ABNORMAL LOW (ref 150–400)

## 2021-01-19 LAB — HEMOGLOBIN AND HEMATOCRIT, BLOOD
HCT: 38.6 % — ABNORMAL LOW (ref 39.0–52.0)
Hemoglobin: 12.8 g/dL — ABNORMAL LOW (ref 13.0–17.0)

## 2021-01-19 LAB — PROTIME-INR
INR: 1.4 — ABNORMAL HIGH (ref 0.8–1.2)
Prothrombin Time: 17.5 seconds — ABNORMAL HIGH (ref 11.4–15.2)

## 2021-01-19 LAB — APTT: aPTT: 28 seconds (ref 24–36)

## 2021-01-19 LAB — MAGNESIUM: Magnesium: 2.3 mg/dL (ref 1.7–2.4)

## 2021-01-19 SURGERY — CORONARY ARTERY BYPASS GRAFTING (CABG)
Anesthesia: General | Site: Chest

## 2021-01-19 MED ORDER — PROTAMINE SULFATE 10 MG/ML IV SOLN
INTRAVENOUS | Status: DC | PRN
  Administered 2021-01-19: 340 mg via INTRAVENOUS

## 2021-01-19 MED ORDER — ASPIRIN EC 325 MG PO TBEC
325.0000 mg | DELAYED_RELEASE_TABLET | Freq: Every day | ORAL | Status: DC
  Administered 2021-01-20 – 2021-01-24 (×5): 325 mg via ORAL
  Filled 2021-01-19 (×5): qty 1

## 2021-01-19 MED ORDER — DOCUSATE SODIUM 100 MG PO CAPS
200.0000 mg | ORAL_CAPSULE | Freq: Every day | ORAL | Status: DC
  Administered 2021-01-20 – 2021-01-22 (×3): 200 mg via ORAL
  Filled 2021-01-19 (×4): qty 2

## 2021-01-19 MED ORDER — PANTOPRAZOLE SODIUM 40 MG PO TBEC
40.0000 mg | DELAYED_RELEASE_TABLET | Freq: Every day | ORAL | Status: DC
  Administered 2021-01-21 – 2021-01-24 (×4): 40 mg via ORAL
  Filled 2021-01-19 (×4): qty 1

## 2021-01-19 MED ORDER — ALBUMIN HUMAN 5 % IV SOLN: INTRAVENOUS | Status: DC | PRN

## 2021-01-19 MED ORDER — ACETAMINOPHEN 160 MG/5ML PO SOLN: 1000.0000 mg | Freq: Four times a day (QID) | ORAL | Status: DC

## 2021-01-19 MED ORDER — SODIUM CHLORIDE 0.9% FLUSH
10.0000 mL | Freq: Two times a day (BID) | INTRAVENOUS | Status: DC
  Administered 2021-01-19 (×2): 10 mL
  Administered 2021-01-20: 30 mL
  Administered 2021-01-21 – 2021-01-23 (×3): 10 mL

## 2021-01-19 MED ORDER — EPHEDRINE SULFATE-NACL 50-0.9 MG/10ML-% IV SOSY
PREFILLED_SYRINGE | INTRAVENOUS | Status: DC | PRN
  Administered 2021-01-19: 5 mg via INTRAVENOUS

## 2021-01-19 MED ORDER — METOPROLOL TARTRATE 25 MG/10 ML ORAL SUSPENSION: 12.5000 mg | Freq: Two times a day (BID) | ORAL | Status: DC

## 2021-01-19 MED ORDER — SODIUM CHLORIDE 0.9% FLUSH: 3.0000 mL | INTRAVENOUS | Status: DC | PRN

## 2021-01-19 MED ORDER — PROPOFOL 10 MG/ML IV BOLUS
INTRAVENOUS | Status: AC
  Filled 2021-01-19: qty 20

## 2021-01-19 MED ORDER — LACTATED RINGERS IV SOLN: INTRAVENOUS | Status: DC | PRN

## 2021-01-19 MED ORDER — MIDAZOLAM HCL (PF) 10 MG/2ML IJ SOLN
INTRAMUSCULAR | Status: AC
  Filled 2021-01-19: qty 2

## 2021-01-19 MED ORDER — CHLORHEXIDINE GLUCONATE CLOTH 2 % EX PADS
6.0000 | MEDICATED_PAD | Freq: Every day | CUTANEOUS | Status: DC
  Administered 2021-01-19 – 2021-01-24 (×6): 6 via TOPICAL

## 2021-01-19 MED ORDER — ORAL CARE MOUTH RINSE: 15.0000 mL | OROMUCOSAL | Status: DC

## 2021-01-19 MED ORDER — PROPOFOL 10 MG/ML IV BOLUS
INTRAVENOUS | Status: DC | PRN
  Administered 2021-01-19: 30 mg via INTRAVENOUS
  Administered 2021-01-19: 60 mg via INTRAVENOUS
  Administered 2021-01-19: 20 mg via INTRAVENOUS

## 2021-01-19 MED ORDER — CEFAZOLIN SODIUM-DEXTROSE 2-4 GM/100ML-% IV SOLN
2.0000 g | Freq: Three times a day (TID) | INTRAVENOUS | Status: AC
  Administered 2021-01-19 – 2021-01-21 (×6): 2 g via INTRAVENOUS
  Filled 2021-01-19 (×6): qty 100

## 2021-01-19 MED ORDER — LACTATED RINGERS IV SOLN: INTRAVENOUS | Status: DC

## 2021-01-19 MED ORDER — TRAMADOL HCL 50 MG PO TABS
50.0000 mg | ORAL_TABLET | ORAL | Status: DC | PRN
  Administered 2021-01-20 – 2021-01-23 (×7): 100 mg via ORAL
  Filled 2021-01-19 (×2): qty 2
  Filled 2021-01-19 (×2): qty 1
  Filled 2021-01-19 (×4): qty 2

## 2021-01-19 MED ORDER — BISACODYL 10 MG RE SUPP: 10.0000 mg | Freq: Every day | RECTAL | Status: DC

## 2021-01-19 MED ORDER — SODIUM CHLORIDE 0.9% FLUSH
3.0000 mL | Freq: Two times a day (BID) | INTRAVENOUS | Status: DC
  Administered 2021-01-20 – 2021-01-21 (×3): 3 mL via INTRAVENOUS

## 2021-01-19 MED ORDER — CHLORHEXIDINE GLUCONATE 0.12% ORAL RINSE (MEDLINE KIT): 15.0000 mL | Freq: Two times a day (BID) | OROMUCOSAL | Status: DC

## 2021-01-19 MED ORDER — ASPIRIN 81 MG PO CHEW: 324.0000 mg | CHEWABLE_TABLET | Freq: Every day | ORAL | Status: DC

## 2021-01-19 MED ORDER — DEXMEDETOMIDINE HCL IN NACL 400 MCG/100ML IV SOLN
0.0000 ug/kg/h | INTRAVENOUS | Status: DC
  Administered 2021-01-19: 0.7 ug/kg/h via INTRAVENOUS
  Filled 2021-01-19: qty 100

## 2021-01-19 MED ORDER — MIDAZOLAM HCL 2 MG/2ML IJ SOLN
2.0000 mg | INTRAMUSCULAR | Status: DC | PRN
  Administered 2021-01-19 – 2021-01-21 (×3): 2 mg via INTRAVENOUS
  Filled 2021-01-19 (×2): qty 2

## 2021-01-19 MED ORDER — MIDAZOLAM HCL (PF) 5 MG/ML IJ SOLN
INTRAMUSCULAR | Status: DC | PRN
  Administered 2021-01-19 (×5): 1 mg via INTRAVENOUS

## 2021-01-19 MED ORDER — METOPROLOL TARTRATE 12.5 MG HALF TABLET: 12.5000 mg | ORAL_TABLET | Freq: Once | ORAL | Status: DC

## 2021-01-19 MED ORDER — FENTANYL CITRATE (PF) 250 MCG/5ML IJ SOLN
INTRAMUSCULAR | Status: AC
  Filled 2021-01-19: qty 25

## 2021-01-19 MED ORDER — METOPROLOL TARTRATE 12.5 MG HALF TABLET
12.5000 mg | ORAL_TABLET | Freq: Two times a day (BID) | ORAL | Status: DC
  Administered 2021-01-20 – 2021-01-21 (×3): 12.5 mg via ORAL
  Filled 2021-01-19 (×3): qty 1

## 2021-01-19 MED ORDER — CHLORHEXIDINE GLUCONATE 0.12 % MT SOLN
OROMUCOSAL | Status: AC
  Administered 2021-01-19: 15 mL via OROMUCOSAL
  Filled 2021-01-19: qty 15

## 2021-01-19 MED ORDER — 0.9 % SODIUM CHLORIDE (POUR BTL) OPTIME
TOPICAL | Status: DC | PRN
  Administered 2021-01-19: 5000 mL

## 2021-01-19 MED ORDER — NICARDIPINE HCL IN NACL 20-0.86 MG/200ML-% IV SOLN: 0.0000 mg/h | INTRAVENOUS | Status: DC

## 2021-01-19 MED ORDER — DEXTROSE 50 % IV SOLN
0.0000 mL | INTRAVENOUS | Status: DC | PRN
  Administered 2021-01-19: 25 mL via INTRAVENOUS
  Filled 2021-01-19: qty 50

## 2021-01-19 MED ORDER — POTASSIUM CHLORIDE 10 MEQ/50ML IV SOLN
10.0000 meq | INTRAVENOUS | Status: AC
  Administered 2021-01-19 (×3): 10 meq via INTRAVENOUS

## 2021-01-19 MED ORDER — ~~LOC~~ CARDIAC SURGERY, PATIENT & FAMILY EDUCATION
Freq: Once | Status: DC
  Filled 2021-01-19: qty 1

## 2021-01-19 MED ORDER — PHENYLEPHRINE HCL-NACL 20-0.9 MG/250ML-% IV SOLN
0.0000 ug/min | INTRAVENOUS | Status: DC
  Administered 2021-01-20: 40 ug/min via INTRAVENOUS
  Administered 2021-01-20: 20 ug/min via INTRAVENOUS
  Filled 2021-01-19 (×2): qty 250

## 2021-01-19 MED ORDER — CHLORHEXIDINE GLUCONATE 4 % EX LIQD: 30.0000 mL | CUTANEOUS | Status: DC

## 2021-01-19 MED ORDER — ALBUMIN HUMAN 5 % IV SOLN: 250.0000 mL | INTRAVENOUS | Status: DC | PRN

## 2021-01-19 MED ORDER — LACTATED RINGERS IV SOLN
500.0000 mL | Freq: Once | INTRAVENOUS | Status: AC | PRN
  Administered 2021-01-19: 500 mL via INTRAVENOUS

## 2021-01-19 MED ORDER — NITROGLYCERIN IN D5W 200-5 MCG/ML-% IV SOLN: 0.0000 ug/min | INTRAVENOUS | Status: DC

## 2021-01-19 MED ORDER — ACETAMINOPHEN 160 MG/5ML PO SOLN: 650.0000 mg | Freq: Once | ORAL | Status: AC

## 2021-01-19 MED ORDER — ROCURONIUM BROMIDE 10 MG/ML (PF) SYRINGE
PREFILLED_SYRINGE | INTRAVENOUS | Status: DC | PRN
  Administered 2021-01-19: 50 mg via INTRAVENOUS
  Administered 2021-01-19: 20 mg via INTRAVENOUS
  Administered 2021-01-19: 40 mg via INTRAVENOUS
  Administered 2021-01-19: 80 mg via INTRAVENOUS
  Administered 2021-01-19: 50 mg via INTRAVENOUS
  Administered 2021-01-19: 20 mg via INTRAVENOUS

## 2021-01-19 MED ORDER — METOPROLOL TARTRATE 5 MG/5ML IV SOLN
2.5000 mg | INTRAVENOUS | Status: DC | PRN
  Administered 2021-01-22: 5 mg via INTRAVENOUS
  Filled 2021-01-19: qty 5

## 2021-01-19 MED ORDER — MAGNESIUM SULFATE 4 GM/100ML IV SOLN
4.0000 g | Freq: Once | INTRAVENOUS | Status: AC
  Administered 2021-01-19: 4 g via INTRAVENOUS
  Filled 2021-01-19: qty 100

## 2021-01-19 MED ORDER — BISACODYL 5 MG PO TBEC
10.0000 mg | DELAYED_RELEASE_TABLET | Freq: Every day | ORAL | Status: DC
  Administered 2021-01-20 – 2021-01-22 (×3): 10 mg via ORAL
  Filled 2021-01-19 (×4): qty 2

## 2021-01-19 MED ORDER — CHLORHEXIDINE GLUCONATE 0.12 % MT SOLN: 15.0000 mL | Freq: Once | OROMUCOSAL | Status: AC

## 2021-01-19 MED ORDER — FAMOTIDINE IN NACL 20-0.9 MG/50ML-% IV SOLN
20.0000 mg | Freq: Two times a day (BID) | INTRAVENOUS | Status: AC
  Administered 2021-01-19 (×2): 20 mg via INTRAVENOUS
  Filled 2021-01-19: qty 50

## 2021-01-19 MED ORDER — NOREPINEPHRINE 4 MG/250ML-% IV SOLN: 0.0000 ug/min | INTRAVENOUS | Status: DC

## 2021-01-19 MED ORDER — SODIUM CHLORIDE (PF) 0.9 % IJ SOLN: OROMUCOSAL | Status: DC | PRN

## 2021-01-19 MED ORDER — ALBUMIN HUMAN 5 % IV SOLN
250.0000 mL | INTRAVENOUS | Status: AC | PRN
  Administered 2021-01-19 – 2021-01-20 (×3): 12.5 g via INTRAVENOUS
  Filled 2021-01-19: qty 250

## 2021-01-19 MED ORDER — SODIUM CHLORIDE 0.9% FLUSH: 10.0000 mL | INTRAVENOUS | Status: DC | PRN

## 2021-01-19 MED ORDER — FENTANYL CITRATE (PF) 250 MCG/5ML IJ SOLN
INTRAMUSCULAR | Status: DC | PRN
  Administered 2021-01-19 (×2): 100 ug via INTRAVENOUS
  Administered 2021-01-19: 50 ug via INTRAVENOUS
  Administered 2021-01-19: 150 ug via INTRAVENOUS
  Administered 2021-01-19 (×3): 50 ug via INTRAVENOUS
  Administered 2021-01-19: 100 ug via INTRAVENOUS
  Administered 2021-01-19: 50 ug via INTRAVENOUS
  Administered 2021-01-19: 100 ug via INTRAVENOUS
  Administered 2021-01-19 (×2): 50 ug via INTRAVENOUS
  Administered 2021-01-19 (×2): 150 ug via INTRAVENOUS

## 2021-01-19 MED ORDER — INSULIN REGULAR(HUMAN) IN NACL 100-0.9 UT/100ML-% IV SOLN: INTRAVENOUS | Status: DC

## 2021-01-19 MED ORDER — VANCOMYCIN HCL IN DEXTROSE 1-5 GM/200ML-% IV SOLN
1000.0000 mg | Freq: Once | INTRAVENOUS | Status: AC
  Administered 2021-01-19: 1000 mg via INTRAVENOUS
  Filled 2021-01-19: qty 200

## 2021-01-19 MED ORDER — PHENYLEPHRINE 40 MCG/ML (10ML) SYRINGE FOR IV PUSH (FOR BLOOD PRESSURE SUPPORT)
PREFILLED_SYRINGE | INTRAVENOUS | Status: DC | PRN
  Administered 2021-01-19: 40 ug via INTRAVENOUS
  Administered 2021-01-19: 80 ug via INTRAVENOUS
  Administered 2021-01-19: 40 ug via INTRAVENOUS
  Administered 2021-01-19: 80 ug via INTRAVENOUS

## 2021-01-19 MED ORDER — DOBUTAMINE IN D5W 4-5 MG/ML-% IV SOLN: 0.0000 ug/kg/min | INTRAVENOUS | Status: DC

## 2021-01-19 MED ORDER — CHLORHEXIDINE GLUCONATE 0.12 % MT SOLN
15.0000 mL | OROMUCOSAL | Status: AC
  Administered 2021-01-19: 15 mL via OROMUCOSAL

## 2021-01-19 MED ORDER — MIDAZOLAM HCL 2 MG/2ML IJ SOLN
INTRAMUSCULAR | Status: AC
  Filled 2021-01-19: qty 2

## 2021-01-19 MED ORDER — SODIUM CHLORIDE 0.9 % IV SOLN: 250.0000 mL | INTRAVENOUS | Status: DC

## 2021-01-19 MED ORDER — SODIUM CHLORIDE 0.45 % IV SOLN: INTRAVENOUS | Status: DC | PRN

## 2021-01-19 MED ORDER — HEPARIN SODIUM (PORCINE) 1000 UNIT/ML IJ SOLN
INTRAMUSCULAR | Status: DC | PRN
  Administered 2021-01-19: 35000 [IU] via INTRAVENOUS

## 2021-01-19 MED ORDER — SODIUM CHLORIDE 0.9 % IV SOLN: INTRAVENOUS | Status: DC

## 2021-01-19 MED ORDER — ACETAMINOPHEN 650 MG RE SUPP
650.0000 mg | Freq: Once | RECTAL | Status: AC
  Administered 2021-01-19: 650 mg via RECTAL

## 2021-01-19 MED ORDER — ACETAMINOPHEN 500 MG PO TABS
1000.0000 mg | ORAL_TABLET | Freq: Four times a day (QID) | ORAL | Status: DC
  Administered 2021-01-19 – 2021-01-23 (×16): 1000 mg via ORAL
  Filled 2021-01-19 (×16): qty 2

## 2021-01-19 MED ORDER — PLASMA-LYTE A IV SOLN: INTRAVENOUS | Status: DC | PRN

## 2021-01-19 MED ORDER — MORPHINE SULFATE (PF) 2 MG/ML IV SOLN
1.0000 mg | INTRAVENOUS | Status: DC | PRN
  Administered 2021-01-19 (×4): 2 mg via INTRAVENOUS
  Administered 2021-01-21: 13:00:00 1 mg via INTRAVENOUS
  Filled 2021-01-19 (×6): qty 1

## 2021-01-19 MED ORDER — LIDOCAINE 2% (20 MG/ML) 5 ML SYRINGE
INTRAMUSCULAR | Status: DC | PRN
  Administered 2021-01-19: 60 mg via INTRAVENOUS

## 2021-01-19 MED ORDER — OXYCODONE HCL 5 MG PO TABS
5.0000 mg | ORAL_TABLET | ORAL | Status: DC | PRN
  Administered 2021-01-20 – 2021-01-21 (×9): 10 mg via ORAL
  Administered 2021-01-21: 05:00:00 5 mg via ORAL
  Administered 2021-01-21 – 2021-01-23 (×2): 10 mg via ORAL
  Administered 2021-01-23: 5 mg via ORAL
  Filled 2021-01-19 (×11): qty 2
  Filled 2021-01-19: qty 1
  Filled 2021-01-19: qty 2

## 2021-01-19 MED ORDER — ONDANSETRON HCL 4 MG/2ML IJ SOLN: 4.0000 mg | Freq: Four times a day (QID) | INTRAMUSCULAR | Status: DC | PRN

## 2021-01-19 SURGICAL SUPPLY — 76 items
BAG DECANTER FOR FLEXI CONT (MISCELLANEOUS) ×2 IMPLANT
BLADE CLIPPER SURG (BLADE) ×2 IMPLANT
BLADE STERNUM SYSTEM 6 (BLADE) ×2 IMPLANT
BNDG ELASTIC 4X5.8 VLCR STR LF (GAUZE/BANDAGES/DRESSINGS) ×2 IMPLANT
BNDG ELASTIC 6X5.8 VLCR STR LF (GAUZE/BANDAGES/DRESSINGS) ×2 IMPLANT
BNDG GAUZE ELAST 4 BULKY (GAUZE/BANDAGES/DRESSINGS) ×2 IMPLANT
CABLE SURGICAL S-101-97-12 (CABLE) ×2 IMPLANT
CANISTER SUCT 3000ML PPV (MISCELLANEOUS) ×2 IMPLANT
CANNULA MC2 2 STG 29/37 NON-V (CANNULA) ×1 IMPLANT
CANNULA MC2 TWO STAGE (CANNULA) ×2
CANNULA NON VENT 22FR 12 (CANNULA) ×2 IMPLANT
CONNECTOR BLAKE 2:1 CARIO BLK (MISCELLANEOUS) ×2 IMPLANT
CONTAINER PROTECT SURGISLUSH (MISCELLANEOUS) ×4 IMPLANT
DEFOGGER ANTIFOG KIT (MISCELLANEOUS) ×2 IMPLANT
DERMABOND ADVANCED (GAUZE/BANDAGES/DRESSINGS) ×1
DERMABOND ADVANCED .7 DNX12 (GAUZE/BANDAGES/DRESSINGS) ×1 IMPLANT
DRAIN CHANNEL 19F RND (DRAIN) ×6 IMPLANT
DRAIN CONNECTOR BLAKE 1:1 (MISCELLANEOUS) ×2 IMPLANT
DRAPE CARDIOVASCULAR INCISE (DRAPES) ×2
DRAPE SRG 135X102X78XABS (DRAPES) ×1 IMPLANT
DRAPE WARM FLUID 44X44 (DRAPES) ×2 IMPLANT
DRSG AQUACEL AG ADV 3.5X10 (GAUZE/BANDAGES/DRESSINGS) ×2 IMPLANT
ELECT BLADE 4.0 EZ CLEAN MEGAD (MISCELLANEOUS) ×2
ELECT REM PT RETURN 9FT ADLT (ELECTROSURGICAL) ×4
ELECTRODE BLDE 4.0 EZ CLN MEGD (MISCELLANEOUS) ×1 IMPLANT
ELECTRODE REM PT RTRN 9FT ADLT (ELECTROSURGICAL) ×2 IMPLANT
FELT TEFLON 1X6 (MISCELLANEOUS) ×2 IMPLANT
GAUZE SPONGE 4X4 12PLY STRL (GAUZE/BANDAGES/DRESSINGS) ×4 IMPLANT
GAUZE SPONGE 4X4 12PLY STRL LF (GAUZE/BANDAGES/DRESSINGS) ×4 IMPLANT
GLOVE SURG ENC MOIS LTX SZ7 (GLOVE) ×4 IMPLANT
GLOVE SURG ENC TEXT LTX SZ7.5 (GLOVE) ×4 IMPLANT
GOWN STRL REUS W/ TWL LRG LVL3 (GOWN DISPOSABLE) ×4 IMPLANT
GOWN STRL REUS W/ TWL XL LVL3 (GOWN DISPOSABLE) ×2 IMPLANT
GOWN STRL REUS W/TWL LRG LVL3 (GOWN DISPOSABLE) ×8
GOWN STRL REUS W/TWL XL LVL3 (GOWN DISPOSABLE) ×4
HEMOSTAT POWDER KIT SURGIFOAM (HEMOSTASIS) ×4 IMPLANT
HEMOSTAT POWDER SURGIFOAM 1G (HEMOSTASIS) ×6 IMPLANT
INSERT SUTURE HOLDER (MISCELLANEOUS) ×2 IMPLANT
KIT BASIN OR (CUSTOM PROCEDURE TRAY) ×2 IMPLANT
KIT SUCTION CATH 14FR (SUCTIONS) ×2 IMPLANT
KIT TURNOVER KIT B (KITS) ×2 IMPLANT
KIT VASOVIEW HEMOPRO 2 VH 4000 (KITS) ×2 IMPLANT
LEAD PACING MYOCARDI (MISCELLANEOUS) ×4 IMPLANT
MARKER GRAFT CORONARY BYPASS (MISCELLANEOUS) ×6 IMPLANT
NS IRRIG 1000ML POUR BTL (IV SOLUTION) ×10 IMPLANT
PACK ACCESSORY CANNULA KIT (KITS) ×2 IMPLANT
PACK E OPEN HEART (SUTURE) ×2 IMPLANT
PACK OPEN HEART (CUSTOM PROCEDURE TRAY) ×2 IMPLANT
PAD ARMBOARD 7.5X6 YLW CONV (MISCELLANEOUS) ×4 IMPLANT
PAD ELECT DEFIB RADIOL ZOLL (MISCELLANEOUS) ×2 IMPLANT
PENCIL BUTTON HOLSTER BLD 10FT (ELECTRODE) ×2 IMPLANT
POSITIONER HEAD DONUT 9IN (MISCELLANEOUS) ×2 IMPLANT
PUNCH AORTIC ROTATE 4.0MM (MISCELLANEOUS) ×2 IMPLANT
SET MPS 3-ND DEL (MISCELLANEOUS) ×2 IMPLANT
SUPPORT HEART JANKE-BARRON (MISCELLANEOUS) ×2 IMPLANT
SUT BONE WAX W31G (SUTURE) ×2 IMPLANT
SUT ETHIBOND X763 2 0 SH 1 (SUTURE) ×4 IMPLANT
SUT MNCRL AB 3-0 PS2 18 (SUTURE) ×4 IMPLANT
SUT PDS AB 1 CTX 36 (SUTURE) ×4 IMPLANT
SUT PROLENE 4 0 SH DA (SUTURE) ×2 IMPLANT
SUT PROLENE 5 0 C 1 36 (SUTURE) ×12 IMPLANT
SUT PROLENE 7 0 BV 1 (SUTURE) ×4 IMPLANT
SUT PROLENE 7 0 BV1 MDA (SUTURE) ×2 IMPLANT
SUT STEEL STERNAL CCS#1 18IN (SUTURE) ×6 IMPLANT
SUT VIC AB 2-0 CT1 27 (SUTURE) ×2
SUT VIC AB 2-0 CT1 TAPERPNT 27 (SUTURE) ×1 IMPLANT
SUT VIC AB 3-0 X1 27 (SUTURE) ×2 IMPLANT
SYSTEM SAHARA CHEST DRAIN ATS (WOUND CARE) ×2 IMPLANT
TAPE CLOTH SURG 4X10 WHT LF (GAUZE/BANDAGES/DRESSINGS) ×2 IMPLANT
TAPE PAPER 2X10 WHT MICROPORE (GAUZE/BANDAGES/DRESSINGS) ×2 IMPLANT
TOWEL GREEN STERILE (TOWEL DISPOSABLE) ×2 IMPLANT
TOWEL GREEN STERILE FF (TOWEL DISPOSABLE) ×2 IMPLANT
TRAY FOLEY SLVR 16FR TEMP STAT (SET/KITS/TRAYS/PACK) ×2 IMPLANT
TUBING LAP HI FLOW INSUFFLATIO (TUBING) ×2 IMPLANT
UNDERPAD 30X36 HEAVY ABSORB (UNDERPADS AND DIAPERS) ×2 IMPLANT
WATER STERILE IRR 1000ML POUR (IV SOLUTION) ×4 IMPLANT

## 2021-01-19 NOTE — Consult Note (Signed)
H&P Physician requesting consult: Melodie Bouillon  Chief Complaint: History of bladder neck contracture  History of Present Illness: 65 year old male patient of Dr. Matilde Sprang who recently underwent cystoscopy with balloon dilation of bulbar urethral stricture.  This was performed on the 10th.  Today he underwent a CABG.  Prior to the surgery, Dr. [name] requested that I placed a Foley catheter given his history.  Past Medical History:  Diagnosis Date   Arthritis    Coronary artery disease    s/p prior stenting to RCA. Last cath in 05/2019 showed in-stent CTO of RCA and CTO of LCX (medical therpay recommended)   Diverticulosis    Dyspnea    Hard of hearing    Hyperlipidemia    Hypertension    hx of elevation on lyrica, off med and now normal    Paroxysmal SVT (supraventricular tachycardia) ([place])    Prostate cancer ([place])    Wears glasses    Past Surgical History:  Procedure Laterality Date   CARDIAC CATHETERIZATION     with 2 stents    COLECTOMY  05/2018   COLONOSCOPY     CYSTOSCOPY WITH URETHRAL DILATATION N/A 01/14/2021   Procedure: CYSTOSCOPY WITH BALLOON  DILATATION;  Surgeon: Bjorn Loser, MD;  Location: [facility];  Service: Urology;  Laterality: N/A;   EYE SURGERY  02/2020   bilaterl cataracts   LEFT HEART CATH AND CORONARY ANGIOGRAPHY N/A 12/24/2020   Procedure: LEFT HEART CATH AND CORONARY ANGIOGRAPHY;  Surgeon: Martinique, Peter M, MD;  Location: [facility];  Service: Cardiovascular;  Laterality: N/A;   PROSTATECTOMY     TOTAL HIP ARTHROPLASTY Left 11/10/2015   Procedure: LEFT TOTAL HIP ARTHROPLASTY ANTERIOR APPROACH;  Surgeon: Mcarthur Rossetti, MD;  Location: [facility];  Service: Orthopedics;  Laterality: Left;    Home Medications:  Medications Prior to Admission  Medication Sig Dispense Refill Last Dose   acetaminophen (TYLENOL) 500 MG tablet Take 1,000 mg by mouth every 8 (eight) hours as needed for mild pain.   01/18/2021   amLODipine (NORVASC) 5 MG  tablet Take 5 mg by mouth daily.   01/18/2021   aspirin EC 81 MG tablet Take 81 mg by mouth daily. Swallow whole.   Past Week   clonazePAM (KLONOPIN) 1 MG tablet TAKE 1 TABLET BY MOUTH 2 TIMES DAILY AS NEEDED FOR ANXIETY. 30 tablet 2 01/18/2021   clopidogrel (PLAVIX) 75 MG tablet Take 75 mg by mouth daily. Stop taking plavix on Saturday Nov. 5, 2022   Past Week   Eszopiclone 3 MG TABS TAKE 1 TABLET BY MOUTH EVERYDAY AT BEDTIME (Patient taking differently: Take 3 mg by mouth at bedtime.) 30 tablet 2 01/18/2021   ezetimibe (ZETIA) 10 MG tablet TAKE 1 TABLET BY MOUTH EVERY DAY (Patient taking differently: Take 10 mg by mouth daily.) 90 tablet 3 01/18/2021   isosorbide mononitrate (IMDUR) 30 MG 24 hr tablet TAKE 2 TABLETS IN THE MORNING AND ONE-HALF TABLET IN THE EVENING. 180 tablet 3 01/18/2021   metoprolol succinate (TOPROL-XL) 25 MG 24 hr tablet TAKE 1.5 TABLETS BY MOUTH DAILY. 135 tablet 3 01/19/2021   nitroGLYCERIN (NITROSTAT) 0.4 MG SL tablet Place 1 tablet (0.4 mg total) under the tongue every 5 (five) minutes as needed for chest pain. (Patient not taking: Reported on 01/01/2021) 25 tablet 2    Omega-3 Fatty Acids (FISH OIL) 1200 MG CAPS Take 2,400 mg by mouth 2 (two) times daily.      Allergies:  Allergies  Allergen Reactions  Ranolazine     Chest discomfort/Reflux   Statins Other (See Comments)    myalgias    Family History  Problem Relation Age of Onset   Alzheimer's disease Mother    Stroke Father    Diabetes Sister    Heart attack Maternal Grandmother    Cancer Maternal Uncle        prostate   Prostate cancer Maternal Uncle    Cancer Maternal Uncle        prostate   Prostate cancer Maternal Uncle    Colon polyps Brother    Colon cancer Neg Hx    Esophageal cancer Neg Hx    Rectal cancer Neg Hx    Stomach cancer Neg Hx    Social History:  reports that he has never smoked. He has never used smokeless tobacco. He reports that he does not currently use alcohol. He reports  that he does not use drugs.  ROS: A complete review of systems was performed.  All systems are negative except for pertinent findings as noted. ROS   Physical Exam:  Vital signs in last 24 hours: Temp:  [96.1 F (35.6 C)-100 F (37.8 C)] 100 F (37.8 C) (11/15 1800) Pulse Rate:  [63-89] 89 (11/15 1830) Resp:  [0-24] 14 (11/15 1830) BP: (100-164)/(76-92) 100/76 (11/15 1800) SpO2:  [95 %-100 %] 98 % (11/15 1830) Arterial Line BP: (79-135)/(51-80) 105/65 (11/15 1830) FiO2 (%):  [40 %-50 %] 40 % (11/15 1625) Weight:  [97.5 kg] 97.5 kg (11/15 0603) General: Intubated and sedated Genitourinary: Normal phallus.  Bilateral testicles descended without masses.  Laboratory Data:  Results for orders placed or performed during the hospital encounter of 01/19/21 (from the past 24 hour(s))  I-STAT 7, (LYTES, BLD GAS, ICA, H+H)     Status: Abnormal   Collection Time: 01/19/21  8:20 AM  Result Value Ref Range   pH, Arterial 7.319 (L) 7.350 - 7.450   pCO2 arterial 45.5 32.0 - 48.0 mmHg   pO2, Arterial 170 (H) 83.0 - 108.0 mmHg   Bicarbonate 23.4 20.0 - 28.0 mmol/L   TCO2 25 22 - 32 mmol/L   O2 Saturation 99.0 %   Acid-base deficit 3.0 (H) 0.0 - 2.0 mmol/L   Sodium 138 135 - 145 mmol/L   Potassium 3.9 3.5 - 5.1 mmol/L   Calcium, Ion 1.28 1.15 - 1.40 mmol/L   HCT 48.0 39.0 - 52.0 %   Hemoglobin 16.3 13.0 - 17.0 g/dL   Sample type ARTERIAL   I-STAT, chem 8     Status: Abnormal   Collection Time: 01/19/21  8:29 AM  Result Value Ref Range   Sodium 139 135 - 145 mmol/L   Potassium 3.9 3.5 - 5.1 mmol/L   Chloride 105 98 - 111 mmol/L   BUN 20 8 - 23 mg/dL   Creatinine, Ser 0.90 0.61 - 1.24 mg/dL   Glucose, Bld 120 (H) 70 - 99 mg/dL   Calcium, Ion 1.24 1.15 - 1.40 mmol/L   TCO2 24 22 - 32 mmol/L   Hemoglobin 17.0 13.0 - 17.0 g/dL   HCT 50.0 39.0 - 52.0 %  I-STAT, chem 8     Status: Abnormal   Collection Time: 01/19/21  9:36 AM  Result Value Ref Range   Sodium 139 135 - 145 mmol/L    Potassium 4.1 3.5 - 5.1 mmol/L   Chloride 105 98 - 111 mmol/L   BUN 18 8 - 23 mg/dL   Creatinine, Ser 0.90 0.61 - 1.24 mg/dL  Glucose, Bld 112 (H) 70 - 99 mg/dL   Calcium, Ion 1.23 1.15 - 1.40 mmol/L   TCO2 22 22 - 32 mmol/L   Hemoglobin 16.0 13.0 - 17.0 g/dL   HCT 47.0 39.0 - 52.0 %  I-STAT 7, (LYTES, BLD GAS, ICA, H+H)     Status: Abnormal   Collection Time: 01/19/21 10:02 AM  Result Value Ref Range   pH, Arterial 7.295 (L) 7.350 - 7.450   pCO2 arterial 52.1 (H) 32.0 - 48.0 mmHg   pO2, Arterial 368 (H) 83.0 - 108.0 mmHg   Bicarbonate 25.4 20.0 - 28.0 mmol/L   TCO2 27 22 - 32 mmol/L   O2 Saturation 100.0 %   Acid-base deficit 2.0 0.0 - 2.0 mmol/L   Sodium 139 135 - 145 mmol/L   Potassium 4.4 3.5 - 5.1 mmol/L   Calcium, Ion 0.93 (L) 1.15 - 1.40 mmol/L   HCT 35.0 (L) 39.0 - 52.0 %   Hemoglobin 11.9 (L) 13.0 - 17.0 g/dL   Sample type ARTERIAL   POCT I-Stat EG7     Status: Abnormal   Collection Time: 01/19/21 10:08 AM  Result Value Ref Range   pH, Ven 7.285 7.250 - 7.430   pCO2, Ven 47.8 44.0 - 60.0 mmHg   pO2, Ven 45.0 32.0 - 45.0 mmHg   Bicarbonate 22.7 20.0 - 28.0 mmol/L   TCO2 24 22 - 32 mmol/L   O2 Saturation 75.0 %   Acid-base deficit 4.0 (H) 0.0 - 2.0 mmol/L   Sodium 142 135 - 145 mmol/L   Potassium 3.7 3.5 - 5.1 mmol/L   Calcium, Ion 1.05 (L) 1.15 - 1.40 mmol/L   HCT 38.0 (L) 39.0 - 52.0 %   Hemoglobin 12.9 (L) 13.0 - 17.0 g/dL   Sample type MIXED VENOUS SAMPLE   I-STAT, chem 8     Status: Abnormal   Collection Time: 01/19/21 10:49 AM  Result Value Ref Range   Sodium 137 135 - 145 mmol/L   Potassium 4.5 3.5 - 5.1 mmol/L   Chloride 104 98 - 111 mmol/L   BUN 18 8 - 23 mg/dL   Creatinine, Ser 0.80 0.61 - 1.24 mg/dL   Glucose, Bld 122 (H) 70 - 99 mg/dL   Calcium, Ion 1.07 (L) 1.15 - 1.40 mmol/L   TCO2 25 22 - 32 mmol/L   Hemoglobin 12.2 (L) 13.0 - 17.0 g/dL   HCT 36.0 (L) 39.0 - 52.0 %  Hemoglobin and hematocrit, blood     Status: Abnormal   Collection Time:  01/19/21 11:03 AM  Result Value Ref Range   Hemoglobin 12.8 (L) 13.0 - 17.0 g/dL   HCT 38.6 (L) 39.0 - 52.0 %  Platelet count     Status: Abnormal   Collection Time: 01/19/21 11:03 AM  Result Value Ref Range   Platelets 148 (L) 150 - 400 K/uL  I-STAT 7, (LYTES, BLD GAS, ICA, H+H)     Status: Abnormal   Collection Time: 01/19/21 11:07 AM  Result Value Ref Range   pH, Arterial 7.377 7.350 - 7.450   pCO2 arterial 42.6 32.0 - 48.0 mmHg   pO2, Arterial 405 (H) 83.0 - 108.0 mmHg   Bicarbonate 25.1 20.0 - 28.0 mmol/L   TCO2 26 22 - 32 mmol/L   O2 Saturation 100.0 %   Acid-Base Excess 0.0 0.0 - 2.0 mmol/L   Sodium 137 135 - 145 mmol/L   Potassium 4.5 3.5 - 5.1 mmol/L   Calcium, Ion 1.07 (L) 1.15 - 1.40 mmol/L  HCT 37.0 (L) 39.0 - 52.0 %   Hemoglobin 12.6 (L) 13.0 - 17.0 g/dL   Sample type ARTERIAL   I-STAT, chem 8     Status: Abnormal   Collection Time: 01/19/21 11:35 AM  Result Value Ref Range   Sodium 137 135 - 145 mmol/L   Potassium 4.4 3.5 - 5.1 mmol/L   Chloride 105 98 - 111 mmol/L   BUN 18 8 - 23 mg/dL   Creatinine, Ser 0.80 0.61 - 1.24 mg/dL   Glucose, Bld 153 (H) 70 - 99 mg/dL   Calcium, Ion 1.06 (L) 1.15 - 1.40 mmol/L   TCO2 24 22 - 32 mmol/L   Hemoglobin 13.3 13.0 - 17.0 g/dL   HCT 39.0 39.0 - 52.0 %  I-STAT 7, (LYTES, BLD GAS, ICA, H+H)     Status: Abnormal   Collection Time: 01/19/21 11:51 AM  Result Value Ref Range   pH, Arterial 7.351 7.350 - 7.450   pCO2 arterial 44.9 32.0 - 48.0 mmHg   pO2, Arterial 353 (H) 83.0 - 108.0 mmHg   Bicarbonate 24.9 20.0 - 28.0 mmol/L   TCO2 26 22 - 32 mmol/L   O2 Saturation 100.0 %   Acid-base deficit 1.0 0.0 - 2.0 mmol/L   Sodium 136 135 - 145 mmol/L   Potassium 4.4 3.5 - 5.1 mmol/L   Calcium, Ion 1.50 (H) 1.15 - 1.40 mmol/L   HCT 37.0 (L) 39.0 - 52.0 %   Hemoglobin 12.6 (L) 13.0 - 17.0 g/dL   Sample type ARTERIAL   I-STAT 7, (LYTES, BLD GAS, ICA, H+H)     Status: Abnormal   Collection Time: 01/19/21 12:12 PM  Result Value  Ref Range   pH, Arterial 7.312 (L) 7.350 - 7.450   pCO2 arterial 47.4 32.0 - 48.0 mmHg   pO2, Arterial 104 83.0 - 108.0 mmHg   Bicarbonate 24.0 20.0 - 28.0 mmol/L   TCO2 25 22 - 32 mmol/L   O2 Saturation 97.0 %   Acid-base deficit 3.0 (H) 0.0 - 2.0 mmol/L   Sodium 138 135 - 145 mmol/L   Potassium 4.1 3.5 - 5.1 mmol/L   Calcium, Ion 1.29 1.15 - 1.40 mmol/L   HCT 38.0 (L) 39.0 - 52.0 %   Hemoglobin 12.9 (L) 13.0 - 17.0 g/dL   Sample type ARTERIAL   I-STAT, chem 8     Status: Abnormal   Collection Time: 01/19/21 12:16 PM  Result Value Ref Range   Sodium 139 135 - 145 mmol/L   Potassium 4.1 3.5 - 5.1 mmol/L   Chloride 104 98 - 111 mmol/L   BUN 17 8 - 23 mg/dL   Creatinine, Ser 0.80 0.61 - 1.24 mg/dL   Glucose, Bld 157 (H) 70 - 99 mg/dL   Calcium, Ion 1.28 1.15 - 1.40 mmol/L   TCO2 22 22 - 32 mmol/L   Hemoglobin 13.6 13.0 - 17.0 g/dL   HCT 40.0 39.0 - 52.0 %  CBC     Status: Abnormal   Collection Time: 01/19/21  1:08 PM  Result Value Ref Range   WBC 12.1 (H) 4.0 - 10.5 K/uL   RBC 4.58 4.22 - 5.81 MIL/uL   Hemoglobin 13.9 13.0 - 17.0 g/dL   HCT 40.8 39.0 - 52.0 %   MCV 89.1 80.0 - 100.0 fL   MCH 30.3 26.0 - 34.0 pg   MCHC 34.1 30.0 - 36.0 g/dL   RDW 13.2 11.5 - 15.5 %   Platelets 102 (L) 150 - 400 K/uL  nRBC 0.0 0.0 - 0.2 %  Protime-INR     Status: Abnormal   Collection Time: 01/19/21  1:08 PM  Result Value Ref Range   Prothrombin Time 17.5 (H) 11.4 - 15.2 seconds   INR 1.4 (H) 0.8 - 1.2  APTT     Status: None   Collection Time: 01/19/21  1:08 PM  Result Value Ref Range   aPTT 28 24 - 36 seconds  I-STAT 7, (LYTES, BLD GAS, ICA, H+H)     Status: Abnormal   Collection Time: 01/19/21  1:46 PM  Result Value Ref Range   pH, Arterial 7.303 (L) 7.350 - 7.450   pCO2 arterial 40.4 32.0 - 48.0 mmHg   pO2, Arterial 79 (L) 83.0 - 108.0 mmHg   Bicarbonate 20.3 20.0 - 28.0 mmol/L   TCO2 22 22 - 32 mmol/L   O2 Saturation 95.0 %   Acid-base deficit 6.0 (H) 0.0 - 2.0 mmol/L    Sodium 141 135 - 145 mmol/L   Potassium 3.9 3.5 - 5.1 mmol/L   Calcium, Ion 1.17 1.15 - 1.40 mmol/L   HCT 39.0 39.0 - 52.0 %   Hemoglobin 13.3 13.0 - 17.0 g/dL   Patient temperature 35.7 C    Sample type ARTERIAL   Glucose, capillary     Status: Abnormal   Collection Time: 01/19/21  1:49 PM  Result Value Ref Range   Glucose-Capillary 125 (H) 70 - 99 mg/dL  Glucose, capillary     Status: Abnormal   Collection Time: 01/19/21  2:40 PM  Result Value Ref Range   Glucose-Capillary 133 (H) 70 - 99 mg/dL  Glucose, capillary     Status: Abnormal   Collection Time: 01/19/21  3:58 PM  Result Value Ref Range   Glucose-Capillary 141 (H) 70 - 99 mg/dL  Glucose, capillary     Status: Abnormal   Collection Time: 01/19/21  4:47 PM  Result Value Ref Range   Glucose-Capillary 131 (H) 70 - 99 mg/dL  I-STAT 7, (LYTES, BLD GAS, ICA, H+H)     Status: Abnormal   Collection Time: 01/19/21  4:48 PM  Result Value Ref Range   pH, Arterial 7.347 (L) 7.350 - 7.450   pCO2 arterial 36.5 32.0 - 48.0 mmHg   pO2, Arterial 100 83.0 - 108.0 mmHg   Bicarbonate 20.0 20.0 - 28.0 mmol/L   TCO2 21 (L) 22 - 32 mmol/L   O2 Saturation 97.0 %   Acid-base deficit 5.0 (H) 0.0 - 2.0 mmol/L   Sodium 139 135 - 145 mmol/L   Potassium 4.5 3.5 - 5.1 mmol/L   Calcium, Ion 1.10 (L) 1.15 - 1.40 mmol/L   HCT 42.0 39.0 - 52.0 %   Hemoglobin 14.3 13.0 - 17.0 g/dL   Patient temperature 37.1 C    Sample type ARTERIAL   Glucose, capillary     Status: Abnormal   Collection Time: 01/19/21  6:06 PM  Result Value Ref Range   Glucose-Capillary 122 (H) 70 - 99 mg/dL  I-STAT 7, (LYTES, BLD GAS, ICA, H+H)     Status: Abnormal   Collection Time: 01/19/21  6:07 PM  Result Value Ref Range   pH, Arterial 7.355 7.350 - 7.450   pCO2 arterial 35.9 32.0 - 48.0 mmHg   pO2, Arterial 103 83.0 - 108.0 mmHg   Bicarbonate 19.9 (L) 20.0 - 28.0 mmol/L   TCO2 21 (L) 22 - 32 mmol/L   O2 Saturation 97.0 %   Acid-base deficit 5.0 (H) 0.0 - 2.0 mmol/L  Sodium 140 135 - 145 mmol/L   Potassium 4.4 3.5 - 5.1 mmol/L   Calcium, Ion 1.06 (L) 1.15 - 1.40 mmol/L   HCT 40.0 39.0 - 52.0 %   Hemoglobin 13.6 13.0 - 17.0 g/dL   Patient temperature 37.8 C    Sample type ARTERIAL    Recent Results (from the past 240 hour(s))  Surgical pcr screen     Status: None   Collection Time: 01/15/21  3:21 PM   Specimen: Nasal Mucosa; Nasal Swab  Result Value Ref Range Status   MRSA, PCR NEGATIVE NEGATIVE Final   Staphylococcus aureus NEGATIVE NEGATIVE Final    Comment: (NOTE) The Xpert SA Assay (FDA approved for NASAL specimens in patients 30 years of age and older), is one component of a comprehensive surveillance program. It is not intended to diagnose infection nor to guide or monitor treatment. Performed at [facility], [place] [address] [place], [place] [address]   SARS CORONAVIRUS 2 (TAT 6-24 HRS) Nasopharyngeal Nasopharyngeal Swab     Status: None   Collection Time: 01/15/21  3:22 PM   Specimen: Nasopharyngeal Swab  Result Value Ref Range Status   SARS Coronavirus 2 NEGATIVE NEGATIVE Final    Comment: (NOTE) SARS-CoV-2 target nucleic acids are NOT DETECTED.  The SARS-CoV-2 RNA is generally detectable in upper and lower respiratory specimens during the acute phase of infection. Negative results do not preclude SARS-CoV-2 infection, do not rule out co-infections with other pathogens, and should not be used as the sole basis for treatment or other patient management decisions. Negative results must be combined with clinical observations, patient history, and epidemiological information. The expected result is Negative.  Fact Sheet for Patients: SugarRoll.be  Fact Sheet for Healthcare Providers: https://www.woods-mathews.com/  This test is not yet approved or cleared by the [place] FDA and  has been authorized for detection and/or diagnosis of SARS-CoV-2 by FDA under an Emergency Use  Authorization (EUA). This EUA will remain  in effect (meaning this test can be used) for the duration of the COVID-19 declaration under Se ction 564(b)(1) of the Act, 21 U.S.C. section 360bbb-3(b)(1), unless the authorization is terminated or revoked sooner.  Performed at [facility], [place] [address], [place], [place] [address]    Creatinine: Recent Labs    01/14/21 1045 01/15/21 1520 01/19/21 0829 01/19/21 0936 01/19/21 1049 01/19/21 1135 01/19/21 1216  CREATININE 1.03 1.04 0.90 0.90 0.80 0.80 0.80   Procedure: Simple Foley catheter insertion Under sterile conditions, a temperature probe Foley catheter was advanced into the urethra and into the bladder without resistance.  Impression/Assessment:  Bladder neck contracture  Plan:  Maintain Foley catheter as per normal post CABG protocol.  Marton Redwood, III 01/19/2021, 6:50 PM

## 2021-01-19 NOTE — Op Note (Signed)
[address]       [place] [address]             [phone]                                          01/19/2021 Patient:  [name] Pre-Op Dx: Three-vessel coronary artery disease   Hypertension   Hyperlipidemia   Diabetes mellitus Post-op Dx: Same Procedure: CABG X 4.  LIMA to LAD, reverse saphenous vein graft to PDA, reverse saphenous vein graft to obtuse marginal #1, reverse saphenous vein graft to first diagonal Endoscopic greater saphenous vein harvest on the right   Surgeon and Role:      * Kingslee Mairena, Lucile Crater, MD - Primary    Evonnie Pat, PA-C- assisting for vein harvesting  Anesthesia  general EBL: 600 ml Blood Administration: None Xclamp Time: 64 min Pump Time: 107 min  Drains: 19 F blake drain: R, L, mediastinal  Wires: Atrial and ventricular Counts: correct   Indications: 65 year old male with severe three-vessel coronary artery disease.  I personally reviewed his left heart cath.  Essentially he is living off of collaterals from his LAD and proximal RCA.  In regards to his inferior wall the PDA fills from right to right collateral off the proximal RCA.  The circumflex is filled via LAD collaterals.  He also has a significant stenosis in his LAD and first diagonal vessel.  He appears to have good targets on all of his wounds.  His echo shows an EF of 50% with preserved RV function.  There is no significant valvular disease.  His EKGs have all shown sinus rhythms.  He is agreeable to proceed with surgical revascularization.  He is scheduled for CABG in the next few weeks.  He does have a history of prostate cancer with a tight bladder stricture.  Urology will be consulted Intra-Op for placement of a Foley catheter.  Findings: Good LIMA.  Large vein conduit.  Good LAD target.  PDA was small caliber, but there were good flows.  The obtuse marginal was good caliber.  The diagonal branch was also good caliber.  Stable biventricular  function.  Operative Technique: All invasive lines were placed in pre-op holding.  After the risks, benefits and alternatives were thoroughly discussed, the patient was brought to the operative theatre.  Anesthesia was induced, and the patient was prepped and draped in normal sterile fashion.  An appropriate surgical pause was performed, and pre-operative antibiotics were dosed accordingly.  We began with simultaneous incisions along the right leg for harvesting of the greater saphenous vein and the chest for the sternotomy.  In regards to the sternotomy, this was carried down with bovie cautery, and the sternum was divided with a reciprocating saw.  Meticulous hemostasis was obtained.  The left internal thoracic artery was exposed and harvested in in pedicled fashion.  The patient was systemically heparinized, and the artery was divided distally, and placed in a papaverine sponge.    The sternal elevator was removed, and a retractor was placed.  The pericardium was divided in the midline and fashioned into a cradle with pericardial stitches.   After we confirmed an appropriate ACT, the ascending aorta was cannulated in standard fashion.  The right atrial appendage was used for venous cannulation site.  Cardiopulmonary bypass was initiated, and the heart retractor was  placed. The cross clamp was applied, and a dose of anterograde cardioplegia was given with good arrest of the heart.  We moved to the posterior wall of the heart, and found a good target on the PDA.  An arteriotomy was made, and the vein graft was anastomosed to it in an end to side fashion.  Next we exposed the lateral wall, and found a good target on the obtuse marginal #1.  An end to side anastomosis with the vein graft was then created.  Next, we exposed the anterior wall of the heart and identified a good target on first diagonal.   An arteriotomy was created.  The vein was anastomosed in an end to side fashion.  Finally, we exposed a good  target on the  LAD, and fashioned an end to side anastomosis between it and the LITA.  We began to re-warm, and a re-animation dose of cardioplegia was given.  The heart was de-aired, and the cross clamp was removed.  Meticulous hemostasis was obtained.    A partial occludding clamp was then placed on the ascending aorta, and we created an end to side anastomosis between it and the proximal vein grafts.  The proximal sites were marked with rings.  Hemostasis was obtained, and we separated from cardiopulmonary bypass without event.  The heparin was reversed with protamine.  Chest tubes and wires were placed, and the sternum was re-approximated with sternal wires.  The soft tissue and skin were re-approximated wth absorbable suture.    The patient tolerated the procedure without any immediate complications, and was transferred to the ICU in guarded condition.  Macrae Wiegman Bary Leriche

## 2021-01-19 NOTE — Anesthesia Procedure Notes (Signed)
Central Venous Catheter Insertion Performed by: Oleta Mouse, MD, anesthesiologist Start/End11/15/2022 6:56 AM, 01/19/2021 7:06 AM Patient location: Pre-op. Preanesthetic checklist: patient identified, IV checked, site marked, risks and benefits discussed, surgical consent, monitors and equipment checked, pre-op evaluation, timeout performed and anesthesia consent Lidocaine 1% used for infiltration and patient sedated Hand hygiene performed  and maximum sterile barriers used  Catheter size: 8.5 Fr Sheath introducer Procedure performed using ultrasound guided technique. Ultrasound Notes:anatomy identified, needle tip was noted to be adjacent to the nerve/plexus identified, no ultrasound evidence of intravascular and/or intraneural injection and image(s) printed for medical record Attempts: 1 Following insertion, line sutured, dressing applied and Biopatch. Post procedure assessment: blood return through all ports, free fluid flow and no air  Patient tolerated the procedure well with no immediate complications.

## 2021-01-19 NOTE — Procedures (Signed)
Extubation Procedure Note  Patient Details:   Name: Jacob Collier DOB: [birth year] MRN: [id number]   Airway Documentation:    Vent end date: 01/19/21 Vent end time: 1655   Evaluation  O2 sats: stable throughout Complications: No apparent complications Patient did tolerate procedure well. Bilateral Breath Sounds: Clear, Diminished   Yes  Pt was extubated per rapid wean protocol and placed on 5 L [place]. NIF -20 and VC 1.5 L prior to extubation. Pt had a cuff leak and no stridor was heard post. Pt is stable at this time. RT will monitor.   Ronaldo Miyamoto 01/19/2021, 5:03 PM

## 2021-01-19 NOTE — Transfer of Care (Signed)
Immediate Anesthesia Transfer of Care Note  Patient: Jacob Collier  Procedure(s) Performed: CORONARY ARTERY BYPASS GRAFTING (CABG), ON PUMP TIMES FOUR, USING LEFT INTERNAL MAMMARY ARTERY AND ENDOSOCPICALLY HARVESTED RIGHT GREATER SAPHENOUS VEIN (Chest) TRANSESOPHAGEAL ECHOCARDIOGRAM (TEE) (Chest)  Patient Location: SICU  Anesthesia Type:General  Level of Consciousness: sedated and Patient remains intubated per anesthesia plan  Airway & Oxygen Therapy: Patient remains intubated per anesthesia plan and Patient placed on Ventilator (see vital sign flow sheet for setting)  Post-op Assessment: Report given to RN and Post -op Vital signs reviewed and stable  Post vital signs: Reviewed and stable  Last Vitals:  Vitals Value Taken Time  BP    Temp 35.8 C 01/19/21 1325  Pulse 79 01/19/21 1325  Resp 24 01/19/21 1325  SpO2 94 % 01/19/21 1325  Vitals shown include unvalidated device data.  Last Pain:  Vitals:   01/19/21 0606  TempSrc:   PainSc: 0-No pain      Patients Stated Pain Goal: 0 (17/71/16 5790)  Complications: No notable events documented.

## 2021-01-19 NOTE — Plan of Care (Signed)

## 2021-01-19 NOTE — Hospital Course (Addendum)
History of Present Illness:     This is a 65 year old gentleman that presents for surgical evaluation of severe three-vessel coronary artery disease.  He has a long history of chest pain and exertional shortness of breath.  Additionally he admits to use of anabolic steroids throughout most of his life.  He also has severe anxiety and when he has a panic attack he feels that this worsens his chest pain or shortness of breath.  Patient and all relevant studies were reviewed by Dr. [name] who recommended proceeding with coronary artery bypass grafting due to the severity of the anatomical findings.  Hospital course:  The patient was admitted electively on 01/19/2021 and taken the operating room and underwent coronary artery bypass grafting x4.  He tolerated the procedure well was taken to the surgical intensive care unit in stable condition. He was extubated late the afternoon of surgery. He was on an Neo Syneprhine drip and was weaned off of it. He was later started on Lopressor. He was maintaining NSR and his pacing wires were removed without difficulty.  The patient has a history of bulbar urethral stricture.  Urology placed a foley catheter prior to surgery and this will remain in place during patient's hospitalization and time of discharge.  He was started on lasix for volume overloaded state. He was weaned off the Insulin drip. His pre op HGA1C was 5.8 and he likely has pre diabetes. Accu checks and SS PRN were stopped. Nutrition information will be provided with discharge paperwork and he will need further surveillance of HGA1C as outpatient with a medical doctor. He was felt stable for transfer from the ICU to 4E for further convalescence on 11/17.  He developed Atrial Fibrillation with RVR.  He was initially treated with IV Lopressor without reduction in HR.  He was then treated with IV Amiodarone bolus and drip therapy.  He converted to NSR and was transitioned to an oral regimen.  He developed  bladder spasms due to indwelling foley.  He was started on Oxybutin and this provided some relief. He is maintaining sinus rhythm. He is tolerating a diet and has had a bowel movement.  He had evidence of phlebitis at his peripheral IV site due to infiltration with Amiodarone.  He will be treated with keflex.  His wounds are clean, dry, and healing without sign of infection. He is ambulating on room air with good oxygenation. He is felt surgically stable for discharge today.

## 2021-01-19 NOTE — Anesthesia Procedure Notes (Signed)
Procedure Name: Intubation Date/Time: 01/19/2021 7:55 AM Performed by: Gaylene Brooks, CRNA Pre-anesthesia Checklist: Patient identified, Emergency Drugs available, Suction available and Patient being monitored Patient Re-evaluated:Patient Re-evaluated prior to induction Oxygen Delivery Method: Circle System Utilized Preoxygenation: Pre-oxygenation with 100% oxygen Induction Type: IV induction Ventilation: Mask ventilation without difficulty and Oral airway inserted - appropriate to patient size Laryngoscope Size: Sabra Heck and 2 Grade View: Grade II Tube type: Oral Tube size: 8.0 mm Number of attempts: 1 Airway Equipment and Method: Stylet and Oral airway Placement Confirmation: ETT inserted through vocal cords under direct vision, positive ETCO2 and breath sounds checked- equal and bilateral Secured at: 22 cm Tube secured with: Tape Dental Injury: Teeth and Oropharynx as per pre-operative assessment  Comments: DL by SRNA with MAC 4, unable to get good view. DL by CRNA with Sabra Heck 2. Grade 2 view. AOI. +ETCO2, BBS=

## 2021-01-19 NOTE — Anesthesia Procedure Notes (Signed)
Arterial Line Insertion Start/End11/15/2022 6:50 AM, 01/19/2021 7:00 AM Performed by: Gaylene Brooks, CRNA  Patient location: Pre-op. Preanesthetic checklist: patient identified, IV checked, site marked, risks and benefits discussed, surgical consent, monitors and equipment checked, pre-op evaluation, timeout performed and anesthesia consent Lidocaine 1% used for infiltration Left, radial was placed Catheter size: 20 G Hand hygiene performed  and maximum sterile barriers used  Allen's test indicative of satisfactory collateral circulation Attempts: 1 Procedure performed without using ultrasound guided technique. Following insertion, Biopatch and dressing applied. Post procedure assessment: normal  Patient tolerated the procedure well with no immediate complications.

## 2021-01-19 NOTE — Anesthesia Procedure Notes (Signed)
Central Venous Catheter Insertion Performed by: Oleta Mouse, MD, anesthesiologist Start/End11/15/2022 6:56 AM, 01/19/2021 7:06 AM Patient location: Pre-op. Preanesthetic checklist: patient identified, IV checked, site marked, risks and benefits discussed, surgical consent, monitors and equipment checked, pre-op evaluation, timeout performed and anesthesia consent Hand hygiene performed  and maximum sterile barriers used  Central line was placed.Triple lumen Procedure performed without using ultrasound guided technique. Attempts: 1 Following insertion, dressing applied. Post procedure assessment: blood return through all ports, free fluid flow and no air  Patient tolerated the procedure well with no immediate complications.

## 2021-01-19 NOTE — Anesthesia Preprocedure Evaluation (Addendum)
Anesthesia Evaluation  Patient identified by MRN, date of birth, ID band Patient awake    Reviewed: Allergy & Precautions, NPO status , Patient's Chart, lab work & pertinent test results, reviewed documented beta blocker date and time   History of Anesthesia Complications Negative for: history of anesthetic complications  Airway Mallampati: III  TM Distance: >3 FB Neck ROM: Full    Dental  (+) Dental Advisory Given, Teeth Intact   Pulmonary shortness of breath,    breath sounds clear to auscultation       Cardiovascular hypertension, Pt. on medications + angina + CAD   Rhythm:Regular  1. Left ventricular ejection fraction, by estimation, is 55%. The left  ventricle has normal function. The left ventricle demonstrates regional  wall motion abnormalities (see scoring diagram/findings for description).  There is mild left ventricular  hypertrophy. Left ventricular diastolic parameters are consistent with  Grade II diastolic dysfunction (pseudonormalization).  2. Right ventricular systolic function is normal. The right ventricular  size is normal.  3. Left atrial size was mildly dilated.  4. The mitral valve is grossly normal. Mild mitral valve regurgitation.  No evidence of mitral stenosis.  5. The aortic valve is tricuspid. There is mild calcification of the  aortic valve. Aortic valve regurgitation is not visualized. No aortic  stenosis is present.  6. Aortic dilatation noted. There is mild dilatation of the ascending  aorta, measuring 41 mm.  7. The inferior vena cava is normal in size with greater than 50%  respiratory variability, suggesting right atrial pressure of 3 mmHg.   Neuro/Psych PSYCHIATRIC DISORDERS Anxiety  Neuromuscular disease    GI/Hepatic negative GI ROS, Neg liver ROS,   Endo/Other  negative endocrine ROS  Renal/GU negative Renal ROS     Musculoskeletal  (+) Arthritis ,   Abdominal    Peds  Hematology Lab Results      Component                Value               Date                      WBC                      14.2 (H)            01/15/2021                HGB                      16.9                01/15/2021                HCT                      49.1                01/15/2021                MCV                      89.1                01/15/2021                PLT  170                 01/15/2021          \    Anesthesia Other Findings   Reproductive/Obstetrics                            Anesthesia Physical Anesthesia Plan  ASA: 4  Anesthesia Plan: General   Post-op Pain Management:    Induction: Intravenous  PONV Risk Score and Plan: 2 and Ondansetron  Airway Management Planned: Oral ETT  Additional Equipment: Arterial line, CVP, TEE and Ultrasound Guidance Line Placement  Intra-op Plan:   Post-operative Plan: Post-operative intubation/ventilation  Informed Consent: I have reviewed the patients History and Physical, chart, labs and discussed the procedure including the risks, benefits and alternatives for the proposed anesthesia with the patient or authorized representative who has indicated his/her understanding and acceptance.     Dental advisory given  Plan Discussed with: CRNA and Anesthesiologist  Anesthesia Plan Comments:         Anesthesia Quick Evaluation

## 2021-01-19 NOTE — Progress Notes (Signed)
Patient ID: Jacob Collier, male   DOB: [birth year], 65 y.o.   MRN: [id number]  TCTS Evening Rounds:   Hemodynamically stable  CI = 2,7  Extubated  Urine output good  CT output low  CBC    Component Value Date/Time   WBC 13.2 (H) 01/19/2021 1900   RBC 4.82 01/19/2021 1900   HGB 14.7 01/19/2021 1900   HGB 18.2 (H) 12/09/2020 1622   HCT 42.6 01/19/2021 1900   HCT 53.2 (H) 12/09/2020 1622   PLT 135 (L) 01/19/2021 1900   PLT 186 12/09/2020 1622   MCV 88.4 01/19/2021 1900   MCV 88 12/09/2020 1622   MCH 30.5 01/19/2021 1900   MCHC 34.5 01/19/2021 1900   RDW 13.1 01/19/2021 1900   RDW 14.0 12/09/2020 1622   LYMPHSABS 1.5 12/30/2020 0107   LYMPHSABS 1.6 06/22/2020 1345   MONOABS 0.6 12/30/2020 0107   EOSABS 0.2 12/30/2020 0107   EOSABS 0.3 06/22/2020 1345   BASOSABS 0.1 12/30/2020 0107   BASOSABS 0.0 06/22/2020 1345     BMET    Component Value Date/Time   NA 136 01/19/2021 1900   NA 137 12/09/2020 1622   K 4.1 01/19/2021 1900   CL 112 (H) 01/19/2021 1900   CO2 18 (L) 01/19/2021 1900   GLUCOSE 118 (H) 01/19/2021 1900   BUN 12 01/19/2021 1900   BUN 17 12/09/2020 1622   CREATININE 0.81 01/19/2021 1900   CREATININE 1.11 09/09/2020 1406   CALCIUM 7.2 (L) 01/19/2021 1900   EGFR 73 12/09/2020 1622   GFRNONAA >60 01/19/2021 1900   GFRNONAA >60 09/09/2020 1406     A/P:  Stable postop course. Continue current plans

## 2021-01-19 NOTE — Consult Note (Signed)
NAME:  Jacob Collier, MRN:  [id number], DOB:  1956/01/11, LOS: 0 ADMISSION DATE:  01/19/2021, CONSULTATION DATE:  11/15 REFERRING MD:  [name], CHIEF COMPLAINT:  post-CABG   History of Present Illness:  Jacob Collier is a 65 year old gentleman history of severe three-vessel coronary artery disease.  He was symptomatic with exertional dyspnea and chest pain.  He underwent PCI in October 2020 with DES to RCA.  He has an ejection fraction of 50% and had preserved RV function.  Coronary risk factors include anabolic steroid use in the past, inability to tolerate statin therapy due to myalgias, chronic testosterone therapy.  He is on amlodipine, aspirin, Plavix, Zetia, Imdur, omega-3 fatty acids prior to admission.  Per cardiology records he should be prescribed metoprolol, but this is not on his PTA med list.  Who presented for four-vessel CABG ( LIMA to LAD, SVG to diagonal and SVG to OM, SVG to PDA) on 11/15.  He recently underwent cystoscopy and balloon dilation of a bladder neck contracture with urology on 01/14/2021.  Urology was consulted preoperatively for Foley catheter placement.  He has chronic anxiety and required Klonopin to be prescribed to help manage his anxiety over the last few months.  Pertinent  Medical History  Anabolic steroid use Prostate cancer status post radical prostatectomy, radiation Coronary artery disease, status post PCI Hypertension Hyperlipidemia Prediabetes  Significant Hospital Events: Including procedures, antibiotic start and stop dates in addition to other pertinent events   CABG on 11/15  Interim History / Subjective:    Objective   Blood pressure (!) 164/76, pulse 63, temperature 97.7 F (36.5 C), temperature source Oral, resp. rate 17, height 5\' 11"  (1.803 m), weight 97.5 kg, SpO2 95 %.        Intake/Output Summary (Last 24 hours) at 01/19/2021 1246 Last data filed at 01/19/2021 1245 Gross per 24 hour  Intake 3650 ml  Output 2100 ml  Net 1550  ml   Filed Weights   01/19/21 0603  Weight: 97.5 kg    Examination: General: critically ill appearing man lying in bed in NAD HENT: [place]/AT, eyes anicteric Lungs: CTAB, breathing synchronously on MV Cardiovascular: S1S2, RRR, pacer wires in place. Bloody output from chest drains Abdomen: soft, NT Extremities: L radial Aline with good distal perfusion, no peripheral edema Neuro: RASS -1, tracks with eyes GU: foley in place draining clear yellow urine Derm: pallor, warm, dry  Resolved Hospital Problem list     Assessment & Plan:  CAD s/p 4-vessel CABG -Telemetry monitoring - Postop electrolyte and lab monitoring per protocol - Inotropes and vasopressors as required to maintain MAP greater than 65 -Complete peri-op antibiotics -Fentanyl, oxycodone, tramadol for pain -Daily aspirin.  Will defer decision to rechallenge with statin to cardiothoracic surgery.  Hyperglycemia - Insulin infusion  Acute hypoxic respiratory failure requiring mechanical ventilation - Rapid weaning protocol - VAP prevention protocol - Sedation as required to maintain lines/ tubes.  Hyperlipidemia, history of statin tolerance - Will defer decision to restart statin to primary team given his history of intolerance.  Urethral stricture, foley had to be placed by Urology pre-operatively -foley not to be removed except by Urology -Strict I's/O  History of secondary polycythemia, concern this could be related to chronic testosterone use versus undiagnosed OSA causing chronic hypoxia.  Anticipate postoperatively he will have acute blood loss anemia.  Follows with Dr. Lorenso Courier from hematology as an outpatient. - Transfuse for hemoglobin less than 7 or hemodynamically significant bleeding.  Chronic anxiety on chronic benzos -  Anticipate that he will require PRN benzos this admission  Best Practice (right click and "Reselect all SmartList Selections" daily)   Diet/type: NPO DVT prophylaxis: SCD GI  prophylaxis: PPI Lines: Central line and Arterial Line Foley:  Yes, and it is still needed Code Status:  full code Last date of multidisciplinary goals of care discussion [ ]   Labs   CBC: Recent Labs  Lab 01/14/21 1045 01/15/21 1520 01/19/21 0820 01/19/21 1103 01/19/21 1107 01/19/21 1135 01/19/21 1151 01/19/21 1212 01/19/21 1216  WBC 6.5 14.2*  --   --   --   --   --   --   --   HGB 17.6* 16.9   < > 12.8* 12.6* 13.3 12.6* 12.9* 13.6  HCT 52.8* 49.1   < > 38.6* 37.0* 39.0 37.0* 38.0* 40.0  MCV 91.0 89.1  --   --   --   --   --   --   --   PLT 174 170  --  148*  --   --   --   --   --    < > = values in this interval not displayed.    Basic Metabolic Panel: Recent Labs  Lab 01/14/21 1045 01/15/21 1520 01/19/21 0820 01/19/21 0829 01/19/21 0936 01/19/21 1002 01/19/21 1049 01/19/21 1107 01/19/21 1135 01/19/21 1151 01/19/21 1212 01/19/21 1216  NA 137 136   < > 139 139   < > 137 137 137 136 138 139  K 4.6 4.2   < > 3.9 4.1   < > 4.5 4.5 4.4 4.4 4.1 4.1  CL 106 109  --  105 105  --  104  --  105  --   --  104  CO2 21* 17*  --   --   --   --   --   --   --   --   --   --   GLUCOSE 94 102*  --  120* 112*  --  122*  --  153*  --   --  157*  BUN 20 18  --  20 18  --  18  --  18  --   --  17  CREATININE 1.03 1.04  --  0.90 0.90  --  0.80  --  0.80  --   --  0.80  CALCIUM 9.3 9.0  --   --   --   --   --   --   --   --   --   --    < > = values in this interval not displayed.   GFR: Estimated Creatinine Clearance: 106.7 mL/min (by C-G formula based on SCr of 0.8 mg/dL). Recent Labs  Lab 01/14/21 1045 01/15/21 1520  WBC 6.5 14.2*    Liver Function Tests: Recent Labs  Lab 01/15/21 1520  AST 27  ALT 42  ALKPHOS 64  BILITOT 0.6  PROT 6.5  ALBUMIN 3.7   No results for input(s): LIPASE, AMYLASE in the last 168 hours. No results for input(s): AMMONIA in the last 168 hours.  ABG    Component Value Date/Time   PHART 7.312 (L) 01/19/2021 1212   PCO2ART 47.4  01/19/2021 1212   PO2ART 104 01/19/2021 1212   HCO3 24.0 01/19/2021 1212   TCO2 22 01/19/2021 1216   ACIDBASEDEF 3.0 (H) 01/19/2021 1212   O2SAT 97.0 01/19/2021 1212     Coagulation Profile: Recent Labs  Lab 01/15/21 1520  INR 1.0  Cardiac Enzymes: No results for input(s): CKTOTAL, CKMB, CKMBINDEX, TROPONINI in the last 168 hours.  HbA1C: Hgb A1c MFr Bld  Date/Time Value Ref Range Status  01/15/2021 03:20 PM 5.8 (H) 4.8 - 5.6 % Final    Comment:    (NOTE) Pre diabetes:          5.7%-6.4%  Diabetes:              >6.4%  Glycemic control for   <7.0% adults with diabetes   11/28/2019 03:48 PM 5.8 (H) 4.8 - 5.6 % Final    Comment:             Prediabetes: 5.7 - 6.4          Diabetes: >6.4          Glycemic control for adults with diabetes: <7.0     CBG: No results for input(s): GLUCAP in the last 168 hours.  Review of Systems:   Limited as patient is intubated, sedated.  Past Medical History:  He,  has a past medical history of Arthritis, Coronary artery disease, Diverticulosis, Dyspnea, Hard of hearing, Hyperlipidemia, Hypertension, Paroxysmal SVT (supraventricular tachycardia) ([place]), Prostate cancer ([place]), and Wears glasses.   Surgical History:   Past Surgical History:  Procedure Laterality Date   CARDIAC CATHETERIZATION     with 2 stents    COLECTOMY  05/2018   COLONOSCOPY     CYSTOSCOPY WITH URETHRAL DILATATION N/A 01/14/2021   Procedure: CYSTOSCOPY WITH BALLOON  DILATATION;  Surgeon: Bjorn Loser, MD;  Location: [facility];  Service: Urology;  Laterality: N/A;   EYE SURGERY  02/2020   bilaterl cataracts   LEFT HEART CATH AND CORONARY ANGIOGRAPHY N/A 12/24/2020   Procedure: LEFT HEART CATH AND CORONARY ANGIOGRAPHY;  Surgeon: Martinique, Peter M, MD;  Location: [facility];  Service: Cardiovascular;  Laterality: N/A;   PROSTATECTOMY     TOTAL HIP ARTHROPLASTY Left 11/10/2015   Procedure: LEFT TOTAL HIP ARTHROPLASTY ANTERIOR APPROACH;  Surgeon:  Mcarthur Rossetti, MD;  Location: [facility];  Service: Orthopedics;  Laterality: Left;     Social History:   reports that he has never smoked. He has never used smokeless tobacco. He reports that he does not currently use alcohol. He reports that he does not use drugs.   Family History:  His family history includes Alzheimer's disease in his mother; Cancer in his maternal uncle and maternal uncle; Colon polyps in his brother; Diabetes in his sister; Heart attack in his maternal grandmother; Prostate cancer in his maternal uncle and maternal uncle; Stroke in his father. There is no history of Colon cancer, Esophageal cancer, Rectal cancer, or Stomach cancer.   Allergies Allergies  Allergen Reactions   Ranolazine     Chest discomfort/Reflux   Statins Other (See Comments)    myalgias     Home Medications  Prior to Admission medications   Medication Sig Start Date End Date Taking? Authorizing Provider  acetaminophen (TYLENOL) 500 MG tablet Take 1,000 mg by mouth every 8 (eight) hours as needed for mild pain.   Yes [provider]  amLODipine (NORVASC) 5 MG tablet Take 5 mg by mouth daily.   Yes [provider]  aspirin EC 81 MG tablet Take 81 mg by mouth daily. Swallow whole.   Yes [provider]  clonazePAM (KLONOPIN) 1 MG tablet TAKE 1 TABLET BY MOUTH 2 TIMES DAILY AS NEEDED FOR ANXIETY. 01/05/21  Yes [name], MD  clopidogrel (PLAVIX) 75 MG tablet  Take 75 mg by mouth daily. Stop taking plavix on Saturday Nov. 5, 2022   Yes [provider]  Eszopiclone 3 MG TABS TAKE 1 TABLET BY MOUTH EVERYDAY AT BEDTIME Patient taking differently: Take 3 mg by mouth at bedtime. 11/24/20  Yes [name], MD  ezetimibe (ZETIA) 10 MG tablet TAKE 1 TABLET BY MOUTH EVERY DAY Patient taking differently: Take 10 mg by mouth daily. 04/21/20  Yes Troy Sine, MD  isosorbide mononitrate (IMDUR) 30 MG 24 hr tablet TAKE 2 TABLETS IN THE MORNING AND  ONE-HALF TABLET IN THE EVENING. 01/07/21  Yes Troy Sine, MD  metoprolol succinate (TOPROL-XL) 25 MG 24 hr tablet TAKE 1.5 TABLETS BY MOUTH DAILY. 01/18/21  Yes Troy Sine, MD  nitroGLYCERIN (NITROSTAT) 0.4 MG SL tablet Place 1 tablet (0.4 mg total) under the tongue every 5 (five) minutes as needed for chest pain. Patient not taking: Reported on 01/01/2021 08/30/19 12/30/20  Darreld Mclean, PA-C  Omega-3 Fatty Acids (FISH OIL) 1200 MG CAPS Take 2,400 mg by mouth 2 (two) times daily.    [provider]     Critical care time: 40 min.    Julian Hy, DO 01/19/21 1:35 PM [facility]

## 2021-01-19 NOTE — Brief Op Note (Signed)
01/19/2021  3:37 PM  PATIENT:  Jacob Collier  65 y.o. male  PRE-OPERATIVE DIAGNOSIS:  Coronary Artery Disease  POST-OPERATIVE DIAGNOSIS:  Coronary Artery Disease  PROCEDURE:  Procedure(s): CORONARY ARTERY BYPASS GRAFTING (CABG), ON PUMP TIMES FOUR, USING LEFT INTERNAL MAMMARY ARTERY AND ENDOSOCPICALLY HARVESTED RIGHT GREATER SAPHENOUS VEIN (N/A) TRANSESOPHAGEAL ECHOCARDIOGRAM (TEE) (N/A) LIMA-LAD SVG-DIAG SCG-OM SVG-PDA EVH 65 MIN  SURGEON:  Surgeon(s) and Role:    * Lightfoot, Lucile Crater, MD - Primary  PHYSICIAN ASSISTANT: Mayfield Schoene PA-C  ASSISTANTS: STAFF   ANESTHESIA:   general  EBL:  1200 mL   BLOOD ADMINISTERED:none  DRAINS:  3 CHEST DRAINS    LOCAL MEDICATIONS USED:  NONE  SPECIMEN:  No Specimen  DISPOSITION OF SPECIMEN:  N/A  COUNTS:  YES  TOURNIQUET:  * No tourniquets in log *  DICTATION: .Dragon Dictation  PLAN OF CARE: Admit to inpatient   PATIENT DISPOSITION:  ICU - intubated and hemodynamically stable.   Delay start of Pharmacological VTE agent (>24hrs) due to surgical blood loss or risk of bleeding: yes  COMPLICATIONS: NO KNOWN

## 2021-01-19 NOTE — Interval H&P Note (Signed)
History and Physical Interval Note:  01/19/2021 7:22 AM  Jacob Collier  has presented today for surgery, with the diagnosis of CAD.  The various methods of treatment have been discussed with the patient and family. After consideration of risks, benefits and other options for treatment, the patient has consented to  Procedure(s): CORONARY ARTERY BYPASS GRAFTING (CABG) (N/A) TRANSESOPHAGEAL ECHOCARDIOGRAM (TEE) (N/A) as a surgical intervention.  The patient's history has been reviewed, patient examined, no change in status, stable for surgery.  I have reviewed the patient's chart and labs.  Questions were answered to the patient's satisfaction.     Taheerah Guldin Bary Leriche

## 2021-01-20 ENCOUNTER — Inpatient Hospital Stay (HOSPITAL_COMMUNITY): Payer: Managed Care, Other (non HMO)

## 2021-01-20 ENCOUNTER — Encounter (HOSPITAL_COMMUNITY): Payer: Self-pay | Admitting: Thoracic Surgery (Cardiothoracic Vascular Surgery)

## 2021-01-20 DIAGNOSIS — Z951 Presence of aortocoronary bypass graft: Secondary | ICD-10-CM | POA: Diagnosis not present

## 2021-01-20 DIAGNOSIS — D751 Secondary polycythemia: Secondary | ICD-10-CM

## 2021-01-20 DIAGNOSIS — R579 Shock, unspecified: Secondary | ICD-10-CM

## 2021-01-20 DIAGNOSIS — N35913 Unspecified membranous urethral stricture, male: Secondary | ICD-10-CM

## 2021-01-20 LAB — GLUCOSE, CAPILLARY
Glucose-Capillary: 100 mg/dL — ABNORMAL HIGH (ref 70–99)
Glucose-Capillary: 104 mg/dL — ABNORMAL HIGH (ref 70–99)
Glucose-Capillary: 106 mg/dL — ABNORMAL HIGH (ref 70–99)
Glucose-Capillary: 108 mg/dL — ABNORMAL HIGH (ref 70–99)
Glucose-Capillary: 110 mg/dL — ABNORMAL HIGH (ref 70–99)
Glucose-Capillary: 119 mg/dL — ABNORMAL HIGH (ref 70–99)
Glucose-Capillary: 123 mg/dL — ABNORMAL HIGH (ref 70–99)
Glucose-Capillary: 124 mg/dL — ABNORMAL HIGH (ref 70–99)
Glucose-Capillary: 126 mg/dL — ABNORMAL HIGH (ref 70–99)
Glucose-Capillary: 90 mg/dL (ref 70–99)

## 2021-01-20 LAB — CBC
HCT: 40.8 % (ref 39.0–52.0)
HCT: 37.9 % — ABNORMAL LOW (ref 39.0–52.0)
Hemoglobin: 14.2 g/dL (ref 13.0–17.0)
Hemoglobin: 13.2 g/dL (ref 13.0–17.0)
MCH: 30.7 pg (ref 26.0–34.0)
MCH: 30.8 pg (ref 26.0–34.0)
MCHC: 34.8 g/dL (ref 30.0–36.0)
MCHC: 34.8 g/dL (ref 30.0–36.0)
MCV: 88.1 fL (ref 80.0–100.0)
MCV: 88.6 fL (ref 80.0–100.0)
Platelets: 154 10*3/uL (ref 150–400)
Platelets: 133 10*3/uL — ABNORMAL LOW (ref 150–400)
RBC: 4.63 MIL/uL (ref 4.22–5.81)
RBC: 4.28 MIL/uL (ref 4.22–5.81)
RDW: 13.3 % (ref 11.5–15.5)
RDW: 13.3 % (ref 11.5–15.5)
WBC: 12.5 10*3/uL — ABNORMAL HIGH (ref 4.0–10.5)
WBC: 12.1 10*3/uL — ABNORMAL HIGH (ref 4.0–10.5)
nRBC: 0 % (ref 0.0–0.2)
nRBC: 0 % (ref 0.0–0.2)

## 2021-01-20 LAB — BASIC METABOLIC PANEL
Anion gap: 6 (ref 5–15)
Anion gap: 9 (ref 5–15)
BUN: 12 mg/dL (ref 8–23)
BUN: 14 mg/dL (ref 8–23)
CO2: 19 mmol/L — ABNORMAL LOW (ref 22–32)
CO2: 19 mmol/L — ABNORMAL LOW (ref 22–32)
Calcium: 7.9 mg/dL — ABNORMAL LOW (ref 8.9–10.3)
Calcium: 8 mg/dL — ABNORMAL LOW (ref 8.9–10.3)
Chloride: 108 mmol/L (ref 98–111)
Chloride: 104 mmol/L (ref 98–111)
Creatinine, Ser: 0.92 mg/dL (ref 0.61–1.24)
Creatinine, Ser: 0.9 mg/dL (ref 0.61–1.24)
GFR, Estimated: >60 mL/min (ref >60)
GFR, Estimated: >60 mL/min (ref >60)
Glucose, Bld: 128 mg/dL — ABNORMAL HIGH (ref 70–99)
Glucose, Bld: 122 mg/dL — ABNORMAL HIGH (ref 70–99)
Potassium: 4.1 mmol/L (ref 3.5–5.1)
Potassium: 3.8 mmol/L (ref 3.5–5.1)
Sodium: 133 mmol/L — ABNORMAL LOW (ref 135–145)
Sodium: 132 mmol/L — ABNORMAL LOW (ref 135–145)

## 2021-01-20 LAB — MAGNESIUM
Magnesium: 2.2 mg/dL (ref 1.7–2.4)
Magnesium: 2.2 mg/dL (ref 1.7–2.4)

## 2021-01-20 MED ORDER — INSULIN ASPART 100 UNIT/ML IJ SOLN: 0.0000 [IU] | Freq: Every day | INTRAMUSCULAR | Status: DC

## 2021-01-20 MED ORDER — INSULIN ASPART 100 UNIT/ML IJ SOLN: 0.0000 [IU] | INTRAMUSCULAR | Status: DC

## 2021-01-20 MED ORDER — INSULIN DETEMIR 100 UNIT/ML ~~LOC~~ SOLN
5.0000 [IU] | Freq: Two times a day (BID) | SUBCUTANEOUS | Status: DC
  Filled 2021-01-20 (×2): qty 0.05

## 2021-01-20 MED ORDER — ENOXAPARIN SODIUM 40 MG/0.4ML IJ SOSY
40.0000 mg | PREFILLED_SYRINGE | Freq: Every day | INTRAMUSCULAR | Status: DC
  Administered 2021-01-20 – 2021-01-23 (×4): 40 mg via SUBCUTANEOUS
  Filled 2021-01-20 (×4): qty 0.4

## 2021-01-20 MED ORDER — INSULIN ASPART 100 UNIT/ML IJ SOLN: 0.0000 [IU] | Freq: Three times a day (TID) | INTRAMUSCULAR | Status: DC

## 2021-01-20 MED ORDER — INSULIN ASPART 100 UNIT/ML IJ SOLN
2.0000 [IU] | INTRAMUSCULAR | Status: DC
Start: 2021-01-20 — End: 2021-01-20

## 2021-01-20 MED ORDER — INSULIN ASPART 100 UNIT/ML IJ SOLN
0.0000 [IU] | Freq: Three times a day (TID) | INTRAMUSCULAR | Status: DC
  Administered 2021-01-20 – 2021-01-21 (×4): 2 [IU] via SUBCUTANEOUS

## 2021-01-20 MED ORDER — ORAL CARE MOUTH RINSE
15.0000 mL | Freq: Two times a day (BID) | OROMUCOSAL | Status: DC
  Administered 2021-01-20 – 2021-01-24 (×5): 15 mL via OROMUCOSAL

## 2021-01-20 MED ORDER — EZETIMIBE 10 MG PO TABS
10.0000 mg | ORAL_TABLET | Freq: Every day | ORAL | Status: DC
  Administered 2021-01-20 – 2021-01-24 (×5): 10 mg via ORAL
  Filled 2021-01-20 (×5): qty 1

## 2021-01-20 MED ORDER — MELATONIN 3 MG PO TABS
3.0000 mg | ORAL_TABLET | Freq: Every evening | ORAL | Status: AC | PRN
  Administered 2021-01-20 – 2021-01-23 (×2): 3 mg via ORAL
  Filled 2021-01-20 (×2): qty 1

## 2021-01-20 MED FILL — Lidocaine HCl Local Preservative Free (PF) Inj 2%: INTRAMUSCULAR | Qty: 15 | Status: AC

## 2021-01-20 MED FILL — Heparin Sodium (Porcine) Inj 1000 Unit/ML: Qty: 1000 | Status: AC

## 2021-01-20 MED FILL — Potassium Chloride Inj 2 mEq/ML: INTRAVENOUS | Qty: 40 | Status: AC

## 2021-01-20 NOTE — Progress Notes (Addendum)
NAME:  Jacob Collier, MRN:  [id number], DOB:  24-Oct-1955, LOS: 1 ADMISSION DATE:  01/19/2021, CONSULTATION DATE:  11/15 REFERRING MD:  [name], CHIEF COMPLAINT:  post-CABG   History of Present Illness:  Jacob Collier is a 65 year old gentleman history of severe three-vessel coronary artery disease.  He was symptomatic with exertional dyspnea and chest pain.  He underwent PCI in October 2020 with DES to RCA.  He has an ejection fraction of 50% and had preserved RV function.  Coronary risk factors include anabolic steroid use in the past, inability to tolerate statin therapy due to myalgias, chronic testosterone therapy.  He is on amlodipine, aspirin, Plavix, Zetia, Imdur, omega-3 fatty acids prior to admission.  Per cardiology records he should be prescribed metoprolol, but this is not on his PTA med list.  Who presented for four-vessel CABG ( LIMA to LAD, SVG to diagonal and SVG to OM, SVG to PDA) on 11/15.  He recently underwent cystoscopy and balloon dilation of a bladder neck contracture with urology on 01/14/2021.  Urology was consulted preoperatively for Foley catheter placement.  He has chronic anxiety and required Klonopin to be prescribed to help manage his anxiety over the last few months.  Pertinent  Medical History  Testosterone for HRT  Prostate cancer status post radical prostatectomy, radiation Coronary artery disease, status post PCI Hypertension Hyperlipidemia Prediabetes  Significant Hospital Events: Including procedures, antibiotic start and stop dates in addition to other pertinent events   CABG on 11/15; extubated that afternoon.  11/16 weaning pressors. Weaning oxygen   Interim History / Subjective:  C/o chest pain from surg but otherwise doing well   Objective   Blood pressure 112/72, pulse 89, temperature 100 F (37.8 C), resp. rate 15, height 5\' 11"  (1.803 m), weight 100.6 kg, SpO2 97 %.    Vent Mode: PSV;CPAP FiO2 (%):  [40 %-50 %] 40 % Set Rate:  [4 bmp-12  bmp] 4 bmp Vt Set:  [600 mL] 600 mL PEEP:  [5 cmH20] 5 cmH20 Pressure Support:  [10 cmH20] 10 cmH20 Plateau Pressure:  [15 cmH20] 15 cmH20   Intake/Output Summary (Last 24 hours) at 01/20/2021 3491 Last data filed at 01/20/2021 0800 Gross per 24 hour  Intake 6026.88 ml  Output 5870 ml  Net 156.88 ml   Filed Weights   01/19/21 0603 01/20/21 0416  Weight: 97.5 kg 100.6 kg    Examination:  General: Pleasant 65 year old white male resting in bed postoperatively he is in no acute distress but does report postoperative discomfort HEENT normocephalic atraumatic mucous membranes are moist there is no jugular venous distention right IJ triple-lumen catheter is intact Pulmonary clear to auscultation diminished bases currently on nasal cannula support.  Using incentive spirometry he can pull 750 cc tidal volumes Cardiac regular rate and rhythm current cardiac index 2.5, mediastinal tubes with minimal output, currently has normal sinus rhythm on telemetry not requiring pacing, Abdomen soft not tender no organomegaly tolerating diet but poor appetite GU Foley catheter in place with clear yellow urine Extremities warm, palpable pulses, he is a little mottled in his lower extremities, this saphenous vein harvest site is well approximated Neuro awake oriented and without focal deficits  Resolved Hospital Problem list    Acute hypoxic respiratory failure requiring mechanical ventilation->extubated 11/15   Assessment & Plan:  CAD s/p 4-vessel CABG Plan Cont daily asa Cont tele  Mobilize  Cont to wean neo per protocol  PRN analgesia  IS   Fluid and electrolyte imbalance: hyponatremia, NAGMA  Plan Keep euvolemic Am chem   Hyperglycemia -excellent glycemic control  plan Ssi   Hyperlipidemia, history of statin tolerance We deferred decision to restart statin to primary team given his history of intolerance. Plan No new recs  Urethral stricture, foley had to be placed by Urology  pre-operatively Plan Keep foley in place. Will f/u w/ urology   History of secondary polycythemia, concern this could be related to chronic TRT versus undiagnosed OSA causing chronic hypoxia.   Follows with Dr. Lorenso Courier from hematology as an outpatient. Plan Trend cbc Tranfusion trigger for hgb < 7   Chronic anxiety on chronic benzos Plan PRNs    Best Practice (right click and "Reselect all SmartList Selections" daily)   Diet/type: Regular consistency (see orders) DVT prophylaxis: LMWH GI prophylaxis: H2B Lines: Central line and Arterial Line Foley:  Yes, and it is still needed Code Status:  full code Last date of multidisciplinary goals of care discussion [ ]    Critical care time: NA    Erick Colace ACNP-BC [facility] Pager # [phone] OR # [phone] if no answer

## 2021-01-20 NOTE — Progress Notes (Signed)
[facility] Progress Note Patient Name: Jacob Collier DOB: 11-03-1955 MRN: [id number]   Date of Service  01/20/2021  HPI/Events of Note  Patient is on an Insulin gtt s/p cardiac surgery but his last blood sugar was 100 mg / dl and endotool is indicating need to transition off Insulin gtt.  eICU Interventions  Hyperglycemia Insulin gtt transition protocol orders entered.        Kerry Kass Adriel Desrosier 01/20/2021, 5:21 AM

## 2021-01-20 NOTE — Progress Notes (Signed)
[facility] Progress Note Patient Name: Jacob Collier DOB: 1956/01/22 MRN: [id number]   Date of Service  01/20/2021  HPI/Events of Note  Patient is requesting a sleep aid.  eICU Interventions  PRN Melatonin 3 mg ordered.        Kerry Kass Milaina Sher 01/20/2021, 8:34 PM

## 2021-01-20 NOTE — Discharge Summary (Addendum)
[address]       [place] [address]             [phone]    Physician Discharge Summary  Patient ID: Jacob Collier MRN: [id number] DOB/AGE: March 07, 1956 65 y.o.  Admit date: 01/19/2021 Discharge date: 01/24/2021  Admission Diagnoses:  Patient Active Problem List   Diagnosis Date Noted   Other forms of angina pectoris ([place]) 12/24/2020   Hypertension 12/08/2020   Paroxysmal supraventricular tachycardia ([place]) 11/26/2020   Polycythemia 06/22/2020   Chronic arthralgias of knees and hips 05/19/2020   Myalgia due to statin 05/19/2020   Bursitis of left shoulder 11/28/2019   ED (erectile dysfunction) 10/22/2019   Sciatica without back pain, left 10/22/2019   History of total left hip replacement 10/10/2019   Primary insomnia 07/05/2019   Anxiety    Allergic rhinitis 05/16/2019   Hyperlipidemia LDL goal <70 05/16/2019   CAD (coronary artery disease) 01/18/2019   H/O heart artery stent 01/18/2019   Sigmoid stricture ([place]) 04/13/2018   Osteoarthritis of left hip 11/10/2015   Status post left hip replacement 11/10/2015   Diverticulosis 09/24/2014   Prostate cancer ([place]) 09/24/2014   Chronic back pain 09/24/2014   Dyspnea 06/06/2014   Murmur 06/06/2014   Discharge Diagnoses:  Patient Active Problem List   Diagnosis Date Noted   Urethral stricture 01/21/2021   S/P CABG x 4 01/19/2021   Other forms of angina pectoris ([place]) 12/24/2020   Hypertension 12/08/2020   Paroxysmal supraventricular tachycardia ([place]) 11/26/2020   Polycythemia 06/22/2020   Chronic arthralgias of knees and hips 05/19/2020   Myalgia due to statin 05/19/2020   Bursitis of left shoulder 11/28/2019   ED (erectile dysfunction) 10/22/2019   Sciatica without back pain, left 10/22/2019   History of total left hip replacement 10/10/2019   Primary insomnia 07/05/2019   Anxiety    Allergic rhinitis 05/16/2019   Hyperlipidemia LDL goal <70 05/16/2019   CAD (coronary artery disease)  01/18/2019   H/O heart artery stent 01/18/2019   Sigmoid stricture ([place]) 04/13/2018   Osteoarthritis of left hip 11/10/2015   Status post left hip replacement 11/10/2015   Diverticulosis 09/24/2014   Prostate cancer ([place]) 09/24/2014   Chronic back pain 09/24/2014   Dyspnea 06/06/2014   Murmur 06/06/2014   Discharged Condition: stable  History of Present Illness:     This is a 65 year old gentleman that presents for surgical evaluation of severe three-vessel coronary artery disease.  He has a long history of chest pain and exertional shortness of breath.  Additionally he admits to use of anabolic steroids throughout most of his life.  He also has severe anxiety and when he has a panic attack he feels that this worsens his chest pain or shortness of breath.  Patient and all relevant studies were reviewed by Dr. [name] who recommended proceeding with coronary artery bypass grafting due to the severity of the anatomical findings.  Hospital course:  The patient was admitted electively on 01/19/2021 and taken the operating room and underwent coronary artery bypass grafting x4.  He tolerated the procedure well was taken to the surgical intensive care unit in stable condition. He was extubated late the afternoon of surgery. He was on an Neo Syneprhine drip and was weaned off of it. He was later started on Lopressor. He was maintaining NSR and his pacing wires were removed without difficulty.  The patient has a history of bulbar urethral stricture.  Urology placed a foley catheter  prior to surgery and this will remain in place during patient's hospitalization and time of discharge.  He was started on lasix for volume overloaded state. He was weaned off the Insulin drip. His pre op HGA1C was 5.8 and he likely has pre diabetes. Accu checks and SS PRN were stopped. Nutrition information will be provided with discharge paperwork and he will need further surveillance of HGA1C as outpatient with a medical  doctor. He was felt stable for transfer from the ICU to 4E for further convalescence on 11/17.  He developed Atrial Fibrillation with RVR.  He was initially treated with IV Lopressor without reduction in HR.  He was then treated with IV Amiodarone bolus and drip therapy.  He converted to NSR and was transitioned to an oral regimen.  He developed bladder spasms due to indwelling foley.  He was started on Oxybutin and this provided some relief. He is maintaining sinus rhythm. He is tolerating a diet and has had a bowel movement.  He had evidence of phlebitis at his peripheral IV site due to infiltration with Amiodarone.  He will be treated with keflex.  His wounds are clean, dry, and healing without sign of infection. He is ambulating on room air with good oxygenation. He is felt surgically stable for discharge today.     Consults: cardiology, urology  Significant Diagnostic Studies:   Angiography:    Prox LAD to Mid LAD lesion is 75% stenosed.   1st Diag lesion is 80% stenosed.   Prox Cx to Mid Cx lesion is 100% stenosed.   Prox RCA to Mid RCA lesion is 100% stenosed.   The left ventricular systolic function is normal.   LV end diastolic pressure is mildly elevated.   The left ventricular ejection fraction is 50-55% by visual estimate.   There is no aortic valve stenosis.   3 vessel obstructive CAD. 75% mid LAD, 80% first diagonal, 100% mid LCx and 100% proximal RCA. Compared to prior cath in March 2021 the LAD stenosis is new.  Mild LV dysfunction with inferobasal HK. EF 50% Mildly elevated LVEDP   Plan: needs CT surgery evaluation for CABG. Will hold Plavix. Plan to refer to Pharm D for PCSK 9 inhibitor.   Treatments: surgery:   01/19/2021 Patient:  Jacob Collier Pre-Op Dx: Three-vessel coronary artery disease                         Hypertension                         Hyperlipidemia                         Diabetes mellitus Post-op Dx: Same Procedure: CABG X 4.  LIMA to LAD,  reverse saphenous vein graft to PDA, reverse saphenous vein graft to obtuse marginal #1, reverse saphenous vein graft to first diagonal Endoscopic greater saphenous vein harvest on the right   Surgeon and Role:      * Lightfoot, Lucile Crater, MD - Primary    Evonnie Pat, PA-C- assisting for vein harvesting  Discharge Exam: Blood pressure 108/66, pulse 60, temperature 98.5 F (36.9 C), temperature source Oral, resp. rate 18, height 5\' 11"  (1.803 m), weight 97.2 kg, SpO2 94 %.  General appearance: alert, cooperative, and no distress Heart: regular rate and rhythm Lungs: clear to auscultation bilaterally Abdomen: soft, non-tender; bowel sounds normal; no masses,  no organomegaly Extremities: extremities normal, atraumatic, no cyanosis or edema Wound: clean and dry   Discharge Medications:  The patient has been discharged on:   1.Beta Blocker:  Yes [ X  ]                              No   [   ]                              If No, reason:  2.Ace Inhibitor/ARB: Yes [   ]                                     No  [  X  ]                                     If No, reason: labile BP  3.Statin:   Yes [   ]                  No  [ X  ]                  If No, reason: Intolerance  4.Shela CommonsVelta Addison  [ X  ]                  No   [   ]                  If No, reason:  Patient had ACS upon admission:  Plavix/P2Y12 inhibitor: Yes [   ]                                      No  [ X  ]     Discharge Instructions     AMB Referral to Advanced Lipid Disorders Clinic   Complete by: As directed    Internal Lipid Clinic Referral Scheduling  Internal lipid clinic referrals are providers within [facility] who wish to refer established patients for routine management (help in starting PCSK9 inhibitor therapy) or advanced therapies.  Internal MD referral criteria:              1. All patients with LDL>190 mg/dL  2. All patients with Triglycerides >500 mg/dL  3. Patients with suspected or confirmed  heterozygous familial hyperlipidemia (HeFH) or homozygous familial hyperlipidemia (HoFH)  4. Patients with family history of suspicious for genetic dyslipidemia desiring genetic testing  5. Patients refractory to standard guideline based therapy  6. Patients with statin intolerance (failed 2 statins, one of which must be a high potency statin)  7. Patients who the provider desires to be seen by MD   Internal PharmD referral criteria:   1. Follow-up patients for medication management  2. Follow-up for compliance monitoring  3. Patients for drug education  4. Patients with statin intolerance  5. PCSK9 inhibitor education and prior authorization approvals  6. Patients with triglycerides <500 mg/dL  External Lipid Clinic Referral  External lipid clinic referrals are for providers outside of [facility], considered new clinic patients - automatically routed to MD schedule   Amb Referral to Cardiac Rehabilitation  Complete by: As directed    Diagnosis: CABG   CABG X ___: 4   After initial evaluation and assessments completed: Virtual Based Care may be provided alone or in conjunction with Phase 2 Cardiac Rehab based on patient barriers.: Yes      Allergies as of 01/24/2021       Reactions   Ranolazine    Chest discomfort/Reflux   Statins Other (See Comments)   myalgias        Medication List     STOP taking these medications    amLODipine 5 MG tablet Commonly known as: NORVASC   isosorbide mononitrate 30 MG 24 hr tablet Commonly known as: IMDUR   metoprolol succinate 25 MG 24 hr tablet Commonly known as: TOPROL-XL       TAKE these medications    acetaminophen 500 MG tablet Commonly known as: TYLENOL Take 1,000 mg by mouth every 8 (eight) hours as needed for mild pain.   amiodarone 200 MG tablet Commonly known as: PACERONE Take 1 tablet (200 mg total) by mouth 2 (two) times daily. X 7 days, then decrease to 200 mg daily   aspirin EC 81 MG tablet Take 81  mg by mouth daily. Swallow whole.   cephALEXin 500 MG capsule Commonly known as: KEFLEX Take 1 capsule (500 mg total) by mouth 3 (three) times daily for 7 days.   clonazePAM 1 MG tablet Commonly known as: KLONOPIN TAKE 1 TABLET BY MOUTH 2 TIMES DAILY AS NEEDED FOR ANXIETY.   clopidogrel 75 MG tablet Commonly known as: PLAVIX Take 75 mg by mouth daily. Stop taking plavix on Saturday Nov. 5, 2022   Eszopiclone 3 MG Tabs TAKE 1 TABLET BY MOUTH EVERYDAY AT BEDTIME What changed: See the new instructions.   ezetimibe 10 MG tablet Commonly known as: ZETIA TAKE 1 TABLET BY MOUTH EVERY DAY   Fish Oil 1200 MG Caps Take 2,400 mg by mouth 2 (two) times daily.   furosemide 40 MG tablet Commonly known as: LASIX Take 1 tablet (40 mg total) by mouth daily.   metoprolol tartrate 25 MG tablet Commonly known as: LOPRESSOR Take 1 tablet (25 mg total) by mouth 2 (two) times daily.   nitroGLYCERIN 0.4 MG SL tablet Commonly known as: NITROSTAT Place 1 tablet (0.4 mg total) under the tongue every 5 (five) minutes as needed for chest pain.   oxybutynin 5 MG tablet Commonly known as: DITROPAN Take 1 tablet (5 mg total) by mouth 3 (three) times daily.   oxyCODONE 5 MG immediate release tablet Commonly known as: Oxy IR/ROXICODONE Take 1-2 tablets (5-10 mg total) by mouth every 3 (three) hours as needed for severe pain.   potassium chloride SA 20 MEQ tablet Commonly known as: KLOR-CON Take 1 tablet (20 mEq total) by mouth daily.               Durable Medical Equipment  (From admission, onward)           Start     Ordered   01/22/21 1507  For home use only DME Walker rolling  Once       Question Answer Comment  Walker: With 5 Inch Wheels   Patient needs a walker to treat with the following condition Weakness generalized      01/22/21 1506            Follow-up Information     Lightfoot, Lucile Crater, MD Follow up.   Specialty: Cardiothoracic Surgery Why: Please see  discharge paperwork  for follow-up appointment with Dr. [name].  He will be a video appointment.  Do not go to the office.  Subsequent appointments will be in person.  Please also obtain a MYCHART account to keep track of follow-up appointments. Contact information: [address] [place] [address] [phone]         Troy Sine, MD Follow up.   Specialty: Cardiology Why: Please see discharge paperwork for follow-up appointment with cardiology. Contact information: [address] [place] [address] [phone]         [name], MD Follow up.   Specialty: Family Medicine Why: Call for a follow up in several months regarding HGA1C 5.8 (pre diabetes) Contact information: [address] [place] [address] [phone]         [facility] Follow up.   Why: (Adapt)- Rolling walker arranged- to be delivered to room prior to discharge Contact information: [address] [place] [address] [phone]                 Signed: Ellwood Handler, PA-C  01/24/2021, 2:20 PM

## 2021-01-20 NOTE — Progress Notes (Signed)
      [address]       [place] [address]             [phone]                 1 Day Post-Op Procedure(s) (LRB): CORONARY ARTERY BYPASS GRAFTING (CABG), ON PUMP TIMES FOUR, USING LEFT INTERNAL MAMMARY ARTERY AND ENDOSOCPICALLY HARVESTED RIGHT GREATER SAPHENOUS VEIN (N/A) TRANSESOPHAGEAL ECHOCARDIOGRAM (TEE) (N/A)   Events: No events Extubated overnight _______________________________________________________________ Vitals: BP 107/69 (BP Location: Right Arm)   Pulse 61   Temp 99.7 F (37.6 C) (Bladder)   Resp 20   Ht 5\' 11"  (1.803 m)   Wt 100.6 kg   SpO2 98%   BMI 30.93 kg/m  Filed Weights   01/19/21 0603 01/20/21 0416  Weight: 97.5 kg 100.6 kg     - Neuro: alert NAd  - Cardiovascular: sinus  Drips: neo 20.      - Pulm: EWOB   ABG    Component Value Date/Time   PHART 7.355 01/19/2021 1807   PCO2ART 35.9 01/19/2021 1807   PO2ART 103 01/19/2021 1807   HCO3 19.9 (L) 01/19/2021 1807   TCO2 21 (L) 01/19/2021 1807   ACIDBASEDEF 5.0 (H) 01/19/2021 1807   O2SAT 97.0 01/19/2021 1807    - Abd: ND - Extremity: warm  .Intake/Output      11/15 0701 11/16 0700 11/16 0701 11/17 0700   I.V. (mL/kg) 3498.5 (34.8) 104.5 (1)   Blood 650    IV Piggyback 1773.4 0.5   Total Intake(mL/kg) 5921.8 (58.9) 105 (1)   Urine (mL/kg/hr) 4135 (1.7) 25 (0.1)   Blood 1200    Chest Tube 570 0   Total Output 5905 25   Net +16.8 +80           _______________________________________________________________ Labs: CBC Latest Ref Rng & Units 01/20/2021 01/19/2021 01/19/2021  WBC 4.0 - 10.5 K/uL 12.5(H) 13.2(H) -  Hemoglobin 13.0 - 17.0 g/dL 14.2 14.7 13.6  Hematocrit 39.0 - 52.0 % 40.8 42.6 40.0  Platelets 150 - 400 K/uL 154 135(L) -   CMP Latest Ref Rng & Units 01/20/2021 01/19/2021 01/19/2021  Glucose 70 - 99 mg/dL 128(H) 118(H) -  BUN 8 - 23 mg/dL 12 12 -  Creatinine 0.61 - 1.24 mg/dL 0.92 0.81 -  Sodium 135 - 145 mmol/L 133(L) 136 140  Potassium  3.5 - 5.1 mmol/L 4.1 4.1 4.4  Chloride 98 - 111 mmol/L 108 112(H) -  CO2 22 - 32 mmol/L 19(L) 18(L) -  Calcium 8.9 - 10.3 mg/dL 7.9(L) 7.2(L) -  Total Protein 6.5 - 8.1 g/dL - - -  Total Bilirubin 0.3 - 1.2 mg/dL - - -  Alkaline Phos 38 - 126 U/L - - -  AST 15 - 41 U/L - - -  ALT 0 - 44 U/L - - -    CXR: PV congestion  _______________________________________________________________  Assessment and Plan: POD 1 s/p CABG.  Doing well  Neuro: pain controlled.   CV: on A/S/BB.  Pacer set to back up. Pulm: pulm hygiene Renal: creat stable.  Will keep foley in place.  Will D/C with leg bag GI: advancing diet Heme: stable ID: afebrile Endo: SSI Dispo: continue ICU care   Aurea Aronov O Cortavious Nix 01/20/2021 9:10 AM

## 2021-01-20 NOTE — Plan of Care (Signed)

## 2021-01-21 ENCOUNTER — Inpatient Hospital Stay (HOSPITAL_COMMUNITY): Payer: Managed Care, Other (non HMO)

## 2021-01-21 DIAGNOSIS — Z951 Presence of aortocoronary bypass graft: Secondary | ICD-10-CM | POA: Diagnosis not present

## 2021-01-21 DIAGNOSIS — E785 Hyperlipidemia, unspecified: Secondary | ICD-10-CM

## 2021-01-21 DIAGNOSIS — Z789 Other specified health status: Secondary | ICD-10-CM | POA: Diagnosis not present

## 2021-01-21 DIAGNOSIS — N35919 Unspecified urethral stricture, male, unspecified site: Secondary | ICD-10-CM

## 2021-01-21 DIAGNOSIS — E871 Hypo-osmolality and hyponatremia: Secondary | ICD-10-CM

## 2021-01-21 DIAGNOSIS — D751 Secondary polycythemia: Secondary | ICD-10-CM | POA: Diagnosis not present

## 2021-01-21 LAB — GLUCOSE, CAPILLARY
Glucose-Capillary: 130 mg/dL — ABNORMAL HIGH (ref 70–99)
Glucose-Capillary: 132 mg/dL — ABNORMAL HIGH (ref 70–99)
Glucose-Capillary: 111 mg/dL — ABNORMAL HIGH (ref 70–99)
Glucose-Capillary: 93 mg/dL (ref 70–99)

## 2021-01-21 LAB — BASIC METABOLIC PANEL
Anion gap: 6 (ref 5–15)
BUN: 14 mg/dL (ref 8–23)
CO2: 22 mmol/L (ref 22–32)
Calcium: 8.2 mg/dL — ABNORMAL LOW (ref 8.9–10.3)
Chloride: 102 mmol/L (ref 98–111)
Creatinine, Ser: 0.95 mg/dL (ref 0.61–1.24)
GFR, Estimated: >60 mL/min (ref >60)
Glucose, Bld: 109 mg/dL — ABNORMAL HIGH (ref 70–99)
Potassium: 3.8 mmol/L (ref 3.5–5.1)
Sodium: 130 mmol/L — ABNORMAL LOW (ref 135–145)

## 2021-01-21 LAB — CBC
HCT: 36.5 % — ABNORMAL LOW (ref 39.0–52.0)
Hemoglobin: 12.2 g/dL — ABNORMAL LOW (ref 13.0–17.0)
MCH: 30.3 pg (ref 26.0–34.0)
MCHC: 33.4 g/dL (ref 30.0–36.0)
MCV: 90.6 fL (ref 80.0–100.0)
Platelets: 114 10*3/uL — ABNORMAL LOW (ref 150–400)
RBC: 4.03 MIL/uL — ABNORMAL LOW (ref 4.22–5.81)
RDW: 13.6 % (ref 11.5–15.5)
WBC: 9.7 10*3/uL (ref 4.0–10.5)
nRBC: 0 % (ref 0.0–0.2)

## 2021-01-21 MED ORDER — FUROSEMIDE 10 MG/ML IJ SOLN
40.0000 mg | Freq: Once | INTRAMUSCULAR | Status: AC
  Administered 2021-01-21: 08:00:00 40 mg via INTRAVENOUS
  Filled 2021-01-21: qty 4

## 2021-01-21 MED ORDER — METHOCARBAMOL 500 MG PO TABS
500.0000 mg | ORAL_TABLET | Freq: Three times a day (TID) | ORAL | Status: AC
  Administered 2021-01-21 – 2021-01-23 (×9): 500 mg via ORAL
  Filled 2021-01-21 (×9): qty 1

## 2021-01-21 MED ORDER — ~~LOC~~ CARDIAC SURGERY, PATIENT & FAMILY EDUCATION: Freq: Once | Status: AC

## 2021-01-21 MED ORDER — SODIUM CHLORIDE 0.9% FLUSH: 3.0000 mL | INTRAVENOUS | Status: DC | PRN

## 2021-01-21 MED ORDER — SODIUM CHLORIDE 0.9 % IV SOLN: 250.0000 mL | INTRAVENOUS | Status: DC | PRN

## 2021-01-21 MED ORDER — FUROSEMIDE 40 MG PO TABS
40.0000 mg | ORAL_TABLET | Freq: Every day | ORAL | Status: DC
  Administered 2021-01-22 – 2021-01-24 (×3): 40 mg via ORAL
  Filled 2021-01-21 (×3): qty 1

## 2021-01-21 MED ORDER — SODIUM CHLORIDE 0.9% FLUSH
3.0000 mL | Freq: Two times a day (BID) | INTRAVENOUS | Status: DC
  Administered 2021-01-21 (×2): 3 mL via INTRAVENOUS

## 2021-01-21 MED ORDER — ZOLPIDEM TARTRATE 5 MG PO TABS
5.0000 mg | ORAL_TABLET | Freq: Every day | ORAL | Status: DC
  Administered 2021-01-21 – 2021-01-23 (×3): 5 mg via ORAL
  Filled 2021-01-21 (×3): qty 1

## 2021-01-21 MED ORDER — POTASSIUM CHLORIDE CRYS ER 20 MEQ PO TBCR
40.0000 meq | EXTENDED_RELEASE_TABLET | Freq: Every day | ORAL | Status: DC
  Administered 2021-01-21 – 2021-01-24 (×4): 40 meq via ORAL
  Filled 2021-01-21 (×4): qty 2

## 2021-01-21 MED FILL — Sodium Chloride IV Soln 0.9%: INTRAVENOUS | Qty: 2000 | Status: AC

## 2021-01-21 MED FILL — Mannitol IV Soln 20%: INTRAVENOUS | Qty: 500 | Status: AC

## 2021-01-21 MED FILL — Sodium Bicarbonate IV Soln 8.4%: INTRAVENOUS | Qty: 50 | Status: AC

## 2021-01-21 MED FILL — Electrolyte-R (PH 7.4) Solution: INTRAVENOUS | Qty: 5000 | Status: AC

## 2021-01-21 MED FILL — Calcium Chloride Inj 10%: INTRAVENOUS | Qty: 10 | Status: AC

## 2021-01-21 NOTE — Plan of Care (Signed)

## 2021-01-21 NOTE — Progress Notes (Signed)
Patient transfer to 4E Room #20. This RN attempted to call report. Receiving RN providing patient care and requests call back in 10 minutes.

## 2021-01-21 NOTE — Progress Notes (Signed)
Pacerwires removed per order. VSS throughout removal. Minimal bleeding at site, wires intact. Pt educated on the need to remain in bed for one hour, and to call for any help needed. Vital signs set to q15. Up time 1415.  Chest tubes removed after pacerwire removal. Dressing applied.  IJ removed, dressing applied. Educated patient to call if any warmth or bleeding occurs at removal site.

## 2021-01-21 NOTE — Anesthesia Postprocedure Evaluation (Signed)
Anesthesia Post Note  Patient: AZARIAN STARACE  Procedure(s) Performed: CORONARY ARTERY BYPASS GRAFTING (CABG), ON PUMP TIMES FOUR, USING LEFT INTERNAL MAMMARY ARTERY AND ENDOSOCPICALLY HARVESTED RIGHT GREATER SAPHENOUS VEIN (Chest) TRANSESOPHAGEAL ECHOCARDIOGRAM (TEE) (Chest)     Patient location during evaluation: SICU Anesthesia Type: General Level of consciousness: sedated Pain management: pain level controlled Vital Signs Assessment: post-procedure vital signs reviewed and stable Respiratory status: patient remains intubated per anesthesia plan Cardiovascular status: stable Postop Assessment: no apparent nausea or vomiting Anesthetic complications: no   No notable events documented.  Last Vitals:  Vitals:   01/21/21 1300 01/21/21 1315  BP: 123/78 111/71  Pulse: 62 60  Resp: 19 (!) 21  Temp: 37.4 C 37.4 C  SpO2: 94% 93%    Last Pain:  Vitals:   01/21/21 1310  TempSrc:   PainSc: 6                  Jas Betten

## 2021-01-21 NOTE — Progress Notes (Signed)
      [address]       [place] [address]             [phone]                 2 Days Post-Op Procedure(s) (LRB): CORONARY ARTERY BYPASS GRAFTING (CABG), ON PUMP TIMES FOUR, USING LEFT INTERNAL MAMMARY ARTERY AND ENDOSOCPICALLY HARVESTED RIGHT GREATER SAPHENOUS VEIN (N/A) TRANSESOPHAGEAL ECHOCARDIOGRAM (TEE) (N/A)   Events: No events  _______________________________________________________________ Vitals: BP 134/66   Pulse (!) 59   Temp 99.3 F (37.4 C)   Resp 11   Ht 5\' 11"  (1.803 m)   Wt 101.2 kg   SpO2 91%   BMI 31.12 kg/m  Filed Weights   01/19/21 0603 01/20/21 0416 01/21/21 0500  Weight: 97.5 kg 100.6 kg 101.2 kg     - Neuro: alert NAd  - Cardiovascular: sinus  Drips: none    - Pulm: EWOB   ABG    Component Value Date/Time   PHART 7.355 01/19/2021 1807   PCO2ART 35.9 01/19/2021 1807   PO2ART 103 01/19/2021 1807   HCO3 19.9 (L) 01/19/2021 1807   TCO2 21 (L) 01/19/2021 1807   ACIDBASEDEF 5.0 (H) 01/19/2021 1807   O2SAT 97.0 01/19/2021 1807    - Abd: ND - Extremity: warm  .Intake/Output      11/16 0701 11/17 0700 11/17 0701 11/18 0700   P.O. 240    I.V. (mL/kg) 784 (7.7)    Blood     IV Piggyback 200.6    Total Intake(mL/kg) 1224.5 (12.1)    Urine (mL/kg/hr) 515 (0.2)    Blood     Chest Tube 230    Total Output 745    Net +479.5            _______________________________________________________________ Labs: CBC Latest Ref Rng & Units 01/21/2021 01/20/2021 01/20/2021  WBC 4.0 - 10.5 K/uL 9.7 12.1(H) 12.5(H)  Hemoglobin 13.0 - 17.0 g/dL 12.2(L) 13.2 14.2  Hematocrit 39.0 - 52.0 % 36.5(L) 37.9(L) 40.8  Platelets 150 - 400 K/uL 114(L) 133(L) 154   CMP Latest Ref Rng & Units 01/21/2021 01/20/2021 01/20/2021  Glucose 70 - 99 mg/dL 109(H) 122(H) 128(H)  BUN 8 - 23 mg/dL 14 14 12   Creatinine 0.61 - 1.24 mg/dL 0.95 0.90 0.92  Sodium 135 - 145 mmol/L 130(L) 132(L) 133(L)  Potassium 3.5 - 5.1 mmol/L 3.8 3.8 4.1   Chloride 98 - 111 mmol/L 102 104 108  CO2 22 - 32 mmol/L 22 19(L) 19(L)  Calcium 8.9 - 10.3 mg/dL 8.2(L) 8.0(L) 7.9(L)  Total Protein 6.5 - 8.1 g/dL - - -  Total Bilirubin 0.3 - 1.2 mg/dL - - -  Alkaline Phos 38 - 126 U/L - - -  AST 15 - 41 U/L - - -  ALT 0 - 44 U/L - - -    CXR: PV congestion  _______________________________________________________________  Assessment and Plan: POD 2 s/p CABG.  Doing well  Neuro: pain controlled.   CV: on A/S/BB.  Will remove wires today Pulm: pulm hygiene Renal: creat stable.  Will keep foley in place for stricture.  Will D/C with leg bag.  Will diurese today GI: on diet Heme: stable ID: afebrile Endo: SSI Dispo: floor   Arjen Deringer O Makaylen Thieme 01/21/2021 7:26 AM

## 2021-01-21 NOTE — TOC Initial Note (Signed)
Transition of Care [facility]) - Initial/Assessment Note    Patient Details  Name: Jacob Collier MRN: [id number] Date of Birth: May 10, 1955  [facility] CM/SW Contact:    Bethena Roys, RN Phone Number: 01/21/2021, 6:48 PM  Clinical Narrative:  Late Entry: Risk for readmission assessment completed. Case Manager spoke with patient and prior to arrival patient was independent from home with his spouse. 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 Patient states his PCP is Reinaldo Meeker and gets to appointments without any issues. Patient [facility] [place]. Case Manager will continue to follow for disposition needs.               Expected Discharge Plan: Home/Self Care Barriers to Discharge: Continued Medical Work up   Patient Goals and CMS Choice Patient states their goals for this hospitalization and ongoing recovery are:: to return home once stable.      Expected Discharge Plan and Services Expected Discharge Plan: Home/Self Care   Discharge Planning Services: CM Consult   Living arrangements for the past 2 months: Single Family Home                   DME Agency: NA                  Prior Living Arrangements/Services Living arrangements for the past 2 months: [place] with:: Self, Spouse (Friend at the bedside during conversation.) Patient language and need for interpreter reviewed:: Yes        Need for Family Participation in Patient Care: Yes (Comment) Care giver support system in place?: Yes (comment)   Criminal Activity/Legal Involvement Pertinent to Current Situation/Hospitalization: No - Comment as needed  Activities of Daily Living      Permission Sought/Granted Permission sought to share information with : Family Supports, Case Manager                Emotional Assessment Appearance:: Appears stated age Attitude/Demeanor/Rapport: Engaged Affect (typically observed): Appropriate Orientation: :  Oriented to Self, Oriented to Place, Oriented to  Time, Oriented to Situation Alcohol / Substance Use: Not Applicable Psych Involvement: No (comment)  Admission diagnosis:  S/P CABG x 4 [Z95.1] Patient Active Problem List   Diagnosis Date Noted   Urethral stricture 01/21/2021   S/P CABG x 4 01/19/2021   Other forms of angina pectoris ([place]) 12/24/2020   Hypertension 12/08/2020   Paroxysmal supraventricular tachycardia ([place]) 11/26/2020   Polycythemia 06/22/2020   Chronic arthralgias of knees and hips 05/19/2020   Myalgia due to statin 05/19/2020   Bursitis of left shoulder 11/28/2019   ED (erectile dysfunction) 10/22/2019   Sciatica without back pain, left 10/22/2019   History of total left hip replacement 10/10/2019   Primary insomnia 07/05/2019   Anxiety    Allergic rhinitis 05/16/2019   Hyperlipidemia LDL goal <70 05/16/2019   CAD (coronary artery disease) 01/18/2019   H/O heart artery stent 01/18/2019   Sigmoid stricture ([place]) 04/13/2018   Osteoarthritis of left hip 11/10/2015   Status post left hip replacement 11/10/2015   Diverticulosis 09/24/2014   Prostate cancer ([place]) 09/24/2014   Chronic back pain 09/24/2014   Dyspnea 06/06/2014   Murmur 06/06/2014   PCP:  [name], MD Pharmacy:   [facility] [address] [place] [address] Phone: [phone] Fax: [phone]  [id number] [place], [address]  [place] [address] Phone: [phone] Fax: [phone]  [id number], [place] [address] Phone: [phone] Fax: [phone]   Readmission Risk Interventions Readmission Risk Prevention Plan 01/21/2021  Transportation Screening Complete  PCP or Specialist Appt within 3-5 Days Complete  HRI or Home Care Consult Complete  Social Work Consult for [place]  Planning/Counseling Complete  Palliative Care Screening Not Applicable  Medication Review Press photographer) Complete  Some recent data might be hidden

## 2021-01-21 NOTE — Progress Notes (Signed)
Report given to Trish from 4E. All questions answered by this RN.

## 2021-01-21 NOTE — Progress Notes (Signed)
Patient ID: Jacob Collier, male   DOB: 1955/05/25, 65 y.o.   MRN: [id number] TCTS Evening Round:  Hemodynamically stable in sinus rhythm.  Good uo today. Awaiting stepdown bed.

## 2021-01-21 NOTE — Progress Notes (Signed)
NAME:  Jacob Collier, MRN:  [id number], DOB:  12/17/1955, LOS: 2 ADMISSION DATE:  01/19/2021, CONSULTATION DATE:  11/15 REFERRING MD:  [name], CHIEF COMPLAINT:  post-CABG   History of Present Illness:  Jacob Collier is a 65 year old gentleman history of severe three-vessel coronary artery disease.  He was symptomatic with exertional dyspnea and chest pain.  He underwent PCI in October 2020 with DES to RCA.  He has an ejection fraction of 50% and had preserved RV function.  Coronary risk factors include anabolic steroid use in the past, inability to tolerate statin therapy due to myalgias, chronic testosterone therapy.  He is on amlodipine, aspirin, Plavix, Zetia, Imdur, omega-3 fatty acids prior to admission.  Per cardiology records he should be prescribed metoprolol, but this is not on his PTA med list.  Who presented for four-vessel CABG ( LIMA to LAD, SVG to diagonal and SVG to OM, SVG to PDA) on 11/15.  He recently underwent cystoscopy and balloon dilation of a bladder neck contracture with urology on 01/14/2021.  Urology was consulted preoperatively for Foley catheter placement.  He has chronic anxiety and required Klonopin to be prescribed to help manage his anxiety over the last few months.  Pertinent  Medical History  Testosterone for HRT  Prostate cancer status post radical prostatectomy, radiation Coronary artery disease, status post PCI Hypertension Hyperlipidemia Prediabetes  Significant Hospital Events: Including procedures, antibiotic start and stop dates in addition to other pertinent events   CABG on 11/15; extubated that afternoon.  11/16 weaning pressors. Weaning oxygen   Interim History / Subjective:  Jacob Collier is feeling well.  He currently denies complaints.  Objective   Blood pressure 111/71, pulse 60, temperature 99.3 F (37.4 C), resp. rate (!) 21, height 5\' 11"  (1.803 m), weight 101.2 kg, SpO2 93 %.        Intake/Output Summary (Last 24 hours) at 01/21/2021  1414 Last data filed at 01/21/2021 1200 Gross per 24 hour  Intake 739.94 ml  Output 1865 ml  Net -1125.06 ml    Filed Weights   01/19/21 0603 01/20/21 0416 01/21/21 0500  Weight: 97.5 kg 100.6 kg 101.2 kg    Examination:  General: Elderly man lying in bed no acute distress  HEENT: [place]/AT, eyes anicteric, oral mucosa moist Cardio:  S1-S2, RRR Pulmonary: Breathing comfortably on room air, CTA B Cardiac: S1-S2, regular rate and rhythm.  Sternal dressing intact. Abdomen soft, nontender, nondistended. GU: Foley draining clear yellow urine Extremities: warm, palpable pulses, he is a little mottled in his lower extremities, this saphenous vein harvest site is well approximated Neuro: awake, alert, moving extremities, answering questions appropriately.  Resolved Hospital Problem list    Acute hypoxic respiratory failure requiring mechanical ventilation->extubated 11/15   Assessment & Plan:   Coronary artery disease, status post CABG x4.  Never smoker.  Risk factors include hypertension, hyperlipidemia, chronic anabolic steroid use. -Postop care per TCTS. -Drains and lines coming out today. -Progress mobility as able -Pain control with oxycodone, tramadol -Daily aspirin - Intolerant statin.  Continue Zetia. -Daily metoprolol  Hyperglycemia, prediabetes.  A1c 5.8 -Sliding scale insulin. - Goal BG less than 180 -Outpatient follow-up for prediabetes  Shock-resolved   Hyponatremia - Continue to monitor.  Anticipate this will correct with diet over the coming days  Leukocytosis, resolved   History of secondary polycythemia-likely due to chronic anabolic steroid use -Recommend outpatient polysomnogram - Follow-up with hematology - Likely would benefit from stopping chronic testosterone use  Prostate cancer, urethral stricture -  Foley placed by urology, not to be removed unless instructed by urology   Jacob Collier and his wife were updated at bedside during rounds.   Best  Practice (right click and "Reselect all SmartList Selections" daily)   Diet/type: Regular consistency (see orders) DVT prophylaxis: LMWH GI prophylaxis: H2B Lines: Central line and Arterial Line Foley:  Yes, and it is still needed Code Status:  full code Last date of multidisciplinary goals of care discussion [ ]    Julian Hy, DO 01/21/21 2:28 PM [facility]

## 2021-01-22 LAB — BASIC METABOLIC PANEL
Anion gap: 7 (ref 5–15)
BUN: 15 mg/dL (ref 8–23)
CO2: 22 mmol/L (ref 22–32)
Calcium: 8.2 mg/dL — ABNORMAL LOW (ref 8.9–10.3)
Chloride: 100 mmol/L (ref 98–111)
Creatinine, Ser: 0.95 mg/dL (ref 0.61–1.24)
GFR, Estimated: >60 mL/min (ref >60)
Glucose, Bld: 94 mg/dL (ref 70–99)
Potassium: 3.8 mmol/L (ref 3.5–5.1)
Sodium: 129 mmol/L — ABNORMAL LOW (ref 135–145)

## 2021-01-22 LAB — GLUCOSE, CAPILLARY
Glucose-Capillary: 98 mg/dL (ref 70–99)
Glucose-Capillary: 110 mg/dL — ABNORMAL HIGH (ref 70–99)
Glucose-Capillary: 110 mg/dL — ABNORMAL HIGH (ref 70–99)

## 2021-01-22 LAB — CBC
HCT: 34.1 % — ABNORMAL LOW (ref 39.0–52.0)
Hemoglobin: 11.5 g/dL — ABNORMAL LOW (ref 13.0–17.0)
MCH: 30 pg (ref 26.0–34.0)
MCHC: 33.7 g/dL (ref 30.0–36.0)
MCV: 89 fL (ref 80.0–100.0)
Platelets: 125 10*3/uL — ABNORMAL LOW (ref 150–400)
RBC: 3.83 MIL/uL — ABNORMAL LOW (ref 4.22–5.81)
RDW: 13.5 % (ref 11.5–15.5)
WBC: 8.6 10*3/uL (ref 4.0–10.5)
nRBC: 0 % (ref 0.0–0.2)

## 2021-01-22 MED ORDER — METOPROLOL TARTRATE 25 MG PO TABS
25.0000 mg | ORAL_TABLET | Freq: Two times a day (BID) | ORAL | Status: DC
  Administered 2021-01-22 – 2021-01-24 (×5): 25 mg via ORAL
  Filled 2021-01-22 (×5): qty 1

## 2021-01-22 MED ORDER — CLONAZEPAM 1 MG PO TABS
1.0000 mg | ORAL_TABLET | Freq: Two times a day (BID) | ORAL | Status: DC | PRN
  Administered 2021-01-23 – 2021-01-24 (×3): 1 mg via ORAL
  Filled 2021-01-22 (×3): qty 1

## 2021-01-22 MED ORDER — AMIODARONE HCL IN DEXTROSE 360-4.14 MG/200ML-% IV SOLN
30.0000 mg/h | INTRAVENOUS | Status: DC
  Administered 2021-01-23: 30 mg/h via INTRAVENOUS
  Filled 2021-01-22 (×2): qty 200

## 2021-01-22 MED ORDER — BISACODYL 10 MG RE SUPP
10.0000 mg | Freq: Every day | RECTAL | Status: DC | PRN
  Administered 2021-01-22: 10 mg via RECTAL
  Filled 2021-01-22: qty 1

## 2021-01-22 MED ORDER — AMIODARONE HCL IN DEXTROSE 360-4.14 MG/200ML-% IV SOLN
60.0000 mg/h | INTRAVENOUS | Status: DC
  Administered 2021-01-22 (×2): 60 mg/h via INTRAVENOUS
  Filled 2021-01-22 (×2): qty 200

## 2021-01-22 MED ORDER — AMIODARONE LOAD VIA INFUSION
150.0000 mg | Freq: Once | INTRAVENOUS | Status: AC
  Administered 2021-01-22: 150 mg via INTRAVENOUS
  Filled 2021-01-22: qty 83.34

## 2021-01-22 NOTE — Progress Notes (Signed)
  Amiodarone Drug - Drug Interaction Consult Note  Recommendations: Patient is currently on metoprolol and furosemide. Continue to monitor heart rate and potassium levels while initiating amiodarone.   Amiodarone is metabolized by the cytochrome P450 system and therefore has the potential to cause many drug interactions. Amiodarone has an average plasma half-life of 50 days (range 20 to 100 days).   There is potential for drug interactions to occur several weeks or months after stopping treatment and the onset of drug interactions may be slow after initiating amiodarone.   []  Statins: Increased risk of myopathy. Simvastatin- restrict dose to 20mg  daily. Other statins: counsel patients to report any muscle pain or weakness immediately.  []  Anticoagulants: Amiodarone can increase anticoagulant effect. Consider warfarin dose reduction. Patients should be monitored closely and the dose of anticoagulant altered accordingly, remembering that amiodarone levels take several weeks to stabilize.  []  Antiepileptics: Amiodarone can increase plasma concentration of phenytoin, the dose should be reduced. Note that small changes in phenytoin dose can result in large changes in levels. Monitor patient and counsel on signs of toxicity.  [x]  Beta blockers: increased risk of bradycardia, AV block and myocardial depression. Sotalol - avoid concomitant use.  []   Calcium channel blockers (diltiazem and verapamil): increased risk of bradycardia, AV block and myocardial depression.  []   Cyclosporine: Amiodarone increases levels of cyclosporine. Reduced dose of cyclosporine is recommended.  []  Digoxin dose should be halved when amiodarone is started.  [x]  Diuretics: increased risk of cardiotoxicity if hypokalemia occurs.  []  Oral hypoglycemic agents (glyburide, glipizide, glimepiride): increased risk of hypoglycemia. Patient's glucose levels should be monitored closely when initiating amiodarone therapy.    []  Drugs that prolong the QT interval:  Torsades de pointes risk may be increased with concurrent use - avoid if possible.  Monitor QTc, also keep magnesium/potassium WNL if concurrent therapy can't be avoided.  Antibiotics: e.g. fluoroquinolones, erythromycin.  Antiarrhythmics: e.g. quinidine, procainamide, disopyramide, sotalol.  Antipsychotics: e.g. phenothiazines, haloperidol.   Lithium, tricyclic antidepressants, and methadone.   Thank you for allowing pharmacy to be a part of this patient's care.  Ardyth Harps, PharmD Clinical Pharmacist

## 2021-01-22 NOTE — Discharge Instructions (Addendum)
Discharge Instructions:  1. You may shower, please wash incisions daily with soap and water and keep dry.  If you wish to cover wounds with dressing you may do so but please keep clean and change daily.  No tub baths or swimming until incisions have completely healed.  If your incisions become red or develop any drainage please call our office at [phone]  2. No Driving until cleared by Dr. [name] office and you are no longer using narcotic pain medications  3. Monitor your weight daily.. Please use the same scale and weigh at same time... If you gain 5-10 lbs in 48 hours with associated lower extremity swelling, please contact our office at [phone]  4. Fever of 101.5 for at least 24 hours with no source, please contact our office at [phone]  5. Activity- up as tolerated, please walk at least 3 times per day.  Avoid strenuous activity, no lifting, pushing, or pulling with your arms over 8-10 lbs for a minimum of 6 weeks  6. If any questions or concerns arise, please do not hesitate to contact our office at [phone]   Prediabetes Eating Plan Prediabetes is a condition that causes blood sugar (glucose) levels to be higher than normal. This increases the risk for developing type 2 diabetes (type 2 diabetes mellitus). Working with a health care provider or nutrition specialist (dietitian) to make diet and lifestyle changes can help prevent the onset of diabetes. These changes may help you: Control your blood glucose levels. Improve your cholesterol levels. Manage your blood pressure. What are tips for following this plan? Reading food labels Read food labels to check the amount of fat, salt (sodium), and sugar in prepackaged foods. Avoid foods that have: Saturated fats. Trans fats. Added sugars. Avoid foods that have more than 300 milligrams (mg) of sodium per serving. Limit your sodium intake to less than 2,300 mg each day. Shopping Avoid buying pre-made and processed  foods. Avoid buying drinks with added sugar. Cooking Cook with olive oil. Do not use butter, lard, or ghee. Bake, broil, grill, steam, or boil foods. Avoid frying. Meal planning Work with your dietitian to create an eating plan that is right for you. This may include tracking how many calories you take in each day. Use a food diary, notebook, or mobile application to track what you eat at each meal. Consider following a Mediterranean diet. This includes: Eating several servings of fresh fruits and vegetables each day. Eating fish at least twice a week. Eating one serving each day of whole grains, beans, nuts, and seeds. Using olive oil instead of other fats. Limiting alcohol. Limiting red meat. Using nonfat or low-fat dairy products. Consider following a plant-based diet. This includes dietary choices that focus on eating mostly vegetables and fruit, grains, beans, nuts, and seeds. If you have high blood pressure, you may need to limit your sodium intake or follow a diet such as the DASH (Dietary Approaches to Stop Hypertension) eating plan. The DASH diet aims to lower high blood pressure. Lifestyle Set weight loss goals with help from your health care team. It is recommended that most people with prediabetes lose 7% of their body weight. Exercise for at least 30 minutes 5 or more days a week. Attend a support group or seek support from a mental health counselor. Take over-the-counter and prescription medicines only as told by your health care provider. What foods are recommended? Fruits Berries. Bananas. Apples. Oranges. Grapes. Papaya. Mango. Pomegranate. Kiwi. Grapefruit. Cherries. Vegetables Lettuce. Spinach.  Peas. Beets. Cauliflower. Cabbage. Broccoli. Carrots. Tomatoes. Squash. Eggplant. Herbs. Peppers. Onions. Cucumbers. Brussels sprouts. Grains Whole grains, such as whole-wheat or whole-grain breads, crackers, cereals, and pasta. Unsweetened oatmeal. Bulgur. Barley. Quinoa. Brown  rice. Corn or whole-wheat flour tortillas or taco shells. Meats and other proteins Seafood. Poultry without skin. Lean cuts of pork and beef. Tofu. Eggs. Nuts. Beans. Dairy Low-fat or fat-free dairy products, such as yogurt, cottage cheese, and cheese. Beverages Water. Tea. Coffee. Sugar-free or diet soda. Seltzer water. Low-fat or nonfat milk. Milk alternatives, such as soy or almond milk. Fats and oils Olive oil. Canola oil. Sunflower oil. Grapeseed oil. Avocado. Walnuts. Sweets and desserts Sugar-free or low-fat pudding. Sugar-free or low-fat ice cream and other frozen treats. Seasonings and condiments Herbs. Sodium-free spices. Mustard. Relish. Low-salt, low-sugar ketchup. Low-salt, low-sugar barbecue sauce. Low-fat or fat-free mayonnaise. The items listed above may not be a complete list of recommended foods and beverages. Contact a dietitian for more information. What foods are not recommended? Fruits Fruits canned with syrup. Vegetables Canned vegetables. Frozen vegetables with butter or cream sauce. Grains Refined white flour and flour products, such as bread, pasta, snack foods, and cereals. Meats and other proteins Fatty cuts of meat. Poultry with skin. Breaded or fried meat. Processed meats. Dairy Full-fat yogurt, cheese, or milk. Beverages Sweetened drinks, such as iced tea and soda. Fats and oils Butter. Lard. Ghee. Sweets and desserts Baked goods, such as cake, cupcakes, pastries, cookies, and cheesecake. Seasonings and condiments Spice mixes with added salt. Ketchup. Barbecue sauce. Mayonnaise. The items listed above may not be a complete list of foods and beverages that are not recommended. Contact a dietitian for more information. Where to find more information American Diabetes Association: www.diabetes.org Summary You may need to make diet and lifestyle changes to help prevent the onset of diabetes. These changes can help you control blood sugar, improve  cholesterol levels, and manage blood pressure. Set weight loss goals with help from your health care team. It is recommended that most people with prediabetes lose 7% of their body weight. Consider following a Mediterranean diet. This includes eating plenty of fresh fruits and vegetables, whole grains, beans, nuts, seeds, fish, and low-fat dairy, and using olive oil instead of other fats. This information is not intended to replace advice given to you by your health care provider. Make sure you discuss any questions you have with your health care provider. Document Revised: 05/23/2019 Document Reviewed: 05/23/2019 Elsevier Patient Education  [place].

## 2021-01-22 NOTE — Progress Notes (Signed)
Contacted via nursing that patient has been taking Klonopin from home during his stay.  She requested we place an order for the medication.  Home medications reviewed and his home dose will be restarted.    Ellwood Handler, PA-C

## 2021-01-22 NOTE — Progress Notes (Signed)
      [address]       [place] [address]             [phone]                 3 Days Post-Op Procedure(s) (LRB): CORONARY ARTERY BYPASS GRAFTING (CABG), ON PUMP TIMES FOUR, USING LEFT INTERNAL MAMMARY ARTERY AND ENDOSOCPICALLY HARVESTED RIGHT GREATER SAPHENOUS VEIN (N/A) TRANSESOPHAGEAL ECHOCARDIOGRAM (TEE) (N/A)   Events: No events  _______________________________________________________________ Vitals: BP 113/66 (BP Location: Left Arm)   Pulse 60   Temp 98.5 F (36.9 C) (Oral)   Resp 12   Ht 5\' 11"  (1.803 m)   Wt 100.9 kg   SpO2 93%   BMI 31.02 kg/m  Filed Weights   01/21/21 0500 01/21/21 2317 01/22/21 0351  Weight: 101.2 kg 100.9 kg 100.9 kg     - Neuro: alert NAd  - Cardiovascular: sinus tac  Drips: none    - Pulm: EWOB   ABG    Component Value Date/Time   PHART 7.355 01/19/2021 1807   PCO2ART 35.9 01/19/2021 1807   PO2ART 103 01/19/2021 1807   HCO3 19.9 (L) 01/19/2021 1807   TCO2 21 (L) 01/19/2021 1807   ACIDBASEDEF 5.0 (H) 01/19/2021 1807   O2SAT 97.0 01/19/2021 1807    - Abd: ND - Extremity: warm  .Intake/Output      11/17 0701 11/18 0700 11/18 0701 11/19 0700   P.O.     I.V. (mL/kg) 337.8 (3.3)    IV Piggyback     Total Intake(mL/kg) 337.8 (3.3)    Urine (mL/kg/hr) 3200 (1.3)    Chest Tube     Total Output 3200    Net -2862.2            _______________________________________________________________ Labs: CBC Latest Ref Rng & Units 01/22/2021 01/21/2021 01/20/2021  WBC 4.0 - 10.5 K/uL 8.6 9.7 12.1(H)  Hemoglobin 13.0 - 17.0 g/dL 11.5(L) 12.2(L) 13.2  Hematocrit 39.0 - 52.0 % 34.1(L) 36.5(L) 37.9(L)  Platelets 150 - 400 K/uL 125(L) 114(L) 133(L)   CMP Latest Ref Rng & Units 01/22/2021 01/21/2021 01/20/2021  Glucose 70 - 99 mg/dL 94 109(H) 122(H)  BUN 8 - 23 mg/dL 15 14 14   Creatinine 0.61 - 1.24 mg/dL 0.95 0.95 0.90  Sodium 135 - 145 mmol/L 129(L) 130(L) 132(L)  Potassium 3.5 - 5.1 mmol/L 3.8 3.8 3.8   Chloride 98 - 111 mmol/L 100 102 104  CO2 22 - 32 mmol/L 22 22 19(L)  Calcium 8.9 - 10.3 mg/dL 8.2(L) 8.2(L) 8.0(L)  Total Protein 6.5 - 8.1 g/dL - - -  Total Bilirubin 0.3 - 1.2 mg/dL - - -  Alkaline Phos 38 - 126 U/L - - -  AST 15 - 41 U/L - - -  ALT 0 - 44 U/L - - -    CXR: PV congestion  _______________________________________________________________  Assessment and Plan: POD 3 s/p CABG.  Doing well  Neuro: pain controlled.   CV: on A/S/BB.  Increasing BB Pulm: pulm hygiene Renal: creat stable.  Will keep foley in place for stricture.  Will D/C with leg bag.  Will diurese today GI: on diet Heme: stable ID: afebrile Endo: SSI Dispo: home likely tomorrow   Lajuana Matte 01/22/2021 8:25 AM

## 2021-01-22 NOTE — Progress Notes (Signed)
CARDIAC REHAB PHASE I   Pt still in Afib this afternoon rates 100-130s lying in bed. Pt denies symptoms, amio bolus getting started now. D/c education completed with pt and sisters. Pt educated on importance of site care and monitoring incisions daily. Encouraged continued IS use, walks, and sternal precautions. Pt given in-the-tube sheet along with heart healthy and diabetic diets. Reviewed restrictions and exercise guidelines. Pt passing gas but has not had a BM since surgery.  Pt admits to taking "half a Klonipin" from his home meds. Pt educated on importance of RN knowing which medications he is taking and how frequently. Strongly discouraged self-medicating. RN and CN made aware.    [phone] Rufina Falco, RN BSN 01/22/2021 2:06 PM

## 2021-01-23 LAB — COMPREHENSIVE METABOLIC PANEL
ALT: 18 U/L (ref 0–44)
AST: 19 U/L (ref 15–41)
Albumin: 2.9 g/dL — ABNORMAL LOW (ref 3.5–5.0)
Alkaline Phosphatase: 50 U/L (ref 38–126)
Anion gap: 9 (ref 5–15)
BUN: 12 mg/dL (ref 8–23)
CO2: 24 mmol/L (ref 22–32)
Calcium: 8.5 mg/dL — ABNORMAL LOW (ref 8.9–10.3)
Chloride: 103 mmol/L (ref 98–111)
Creatinine, Ser: 1.07 mg/dL (ref 0.61–1.24)
GFR, Estimated: >60 mL/min (ref >60)
Glucose, Bld: 100 mg/dL — ABNORMAL HIGH (ref 70–99)
Potassium: 3.7 mmol/L (ref 3.5–5.1)
Sodium: 136 mmol/L (ref 135–145)
Total Bilirubin: 0.9 mg/dL (ref 0.3–1.2)
Total Protein: 5.8 g/dL — ABNORMAL LOW (ref 6.5–8.1)

## 2021-01-23 MED ORDER — AMIODARONE HCL 200 MG PO TABS
200.0000 mg | ORAL_TABLET | Freq: Two times a day (BID) | ORAL | Status: DC
  Administered 2021-01-23 – 2021-01-24 (×3): 200 mg via ORAL
  Filled 2021-01-23 (×3): qty 1

## 2021-01-23 MED ORDER — OXYBUTYNIN CHLORIDE 5 MG PO TABS
5.0000 mg | ORAL_TABLET | Freq: Three times a day (TID) | ORAL | Status: DC
  Administered 2021-01-23 – 2021-01-24 (×4): 5 mg via ORAL
  Filled 2021-01-23 (×6): qty 1

## 2021-01-23 NOTE — Progress Notes (Signed)
Patient converted to [name] 60-70.

## 2021-01-23 NOTE — Progress Notes (Signed)
Mobility Specialist: Progress Note   01/23/21 1552  Mobility  Activity Ambulated in hall  Level of Assistance Modified independent, requires aide device or extra time  Assistive Device Front wheel walker  Distance Ambulated (ft) 390 ft  Mobility Ambulated with assistance in hallway  Mobility Response Tolerated well  Mobility performed by Mobility specialist  $Mobility charge 1 Mobility   Pre-Mobility: 68 HR, 94% SpO2 Post-Mobility: 68 HR, 97% SpO2  Pt required x2 attempts to stand but was able to stand independently. Pt had no c/o during ambulation. Pt back to bed after walk with call bell and phone at his side. Family member present in the room.   [facility] Jacob Collier Mobility Specialist Mobility Specialist Phone #1: [phone] Mobility Specialist Phone #2: [phone]

## 2021-01-23 NOTE — Progress Notes (Addendum)
      [address]       [place] [address]             [phone]       4 Days Post-Op Procedure(s) (LRB): CORONARY ARTERY BYPASS GRAFTING (CABG), ON PUMP TIMES FOUR, USING LEFT INTERNAL MAMMARY ARTERY AND ENDOSOCPICALLY HARVESTED RIGHT GREATER SAPHENOUS VEIN (N/A) TRANSESOPHAGEAL ECHOCARDIOGRAM (TEE) (N/A)  Subjective:  Patient having bladder spasms this morning.  States these are quite severe.   He has a complex prostate history.  + ambulation  + BM  Objective: Vital signs in last 24 hours: Temp:  [98.1 F (36.7 C)-98.6 F (37 C)] 98.3 F (36.8 C) (11/19 0417) Pulse Rate:  [56-109] 59 (11/19 0417) Cardiac Rhythm: Sinus tachycardia (11/18 2141) Resp:  [14-20] 14 (11/19 0417) BP: (102-130)/(59-87) 110/63 (11/19 0417) SpO2:  [94 %-97 %] 97 % (11/19 0417) Weight:  [103.1 kg] 103.1 kg (11/19 0417)  Intake/Output from previous day: 11/18 0701 - 11/19 0700 In: 414 [P.O.:240; I.V.:174] Out: 4050 [Urine:4050]   General appearance: alert, cooperative, and no distress Heart: regular rate and rhythm Lungs: clear to auscultation bilaterally Abdomen: soft, non-tender; bowel sounds normal; no masses,  no organomegaly Extremities: edema trace Wound: clean and dry  Lab Results: Recent Labs    01/21/21 0400 01/22/21 0231  WBC 9.7 8.6  HGB 12.2* 11.5*  HCT 36.5* 34.1*  PLT 114* 125*   BMET:  Recent Labs    01/21/21 0400 01/22/21 0231  NA 130* 129*  K 3.8 3.8  CL 102 100  CO2 22 22  GLUCOSE 109* 94  BUN 14 15  CREATININE 0.95 0.95  CALCIUM 8.2* 8.2*    PT/INR: No results for input(s): LABPROT, INR in the last 72 hours. ABG    Component Value Date/Time   PHART 7.355 01/19/2021 1807   HCO3 19.9 (L) 01/19/2021 1807   TCO2 21 (L) 01/19/2021 1807   ACIDBASEDEF 5.0 (H) 01/19/2021 1807   O2SAT 97.0 01/19/2021 1807   CBG (last 3)  Recent Labs    01/22/21 0608 01/22/21 1236 01/22/21 1619  GLUCAP 98 110* 110*    Assessment/Plan: S/P  Procedure(s) (LRB): CORONARY ARTERY BYPASS GRAFTING (CABG), ON PUMP TIMES FOUR, USING LEFT INTERNAL MAMMARY ARTERY AND ENDOSOCPICALLY HARVESTED RIGHT GREATER SAPHENOUS VEIN (N/A) TRANSESOPHAGEAL ECHOCARDIOGRAM (TEE) (N/A)  CV- PAF, converted to NSR last evening, currently with rates in the 60s, will continue Lopressor 25 mg BID, stop IV Amiodarone and start oral at 200 mg BID Pulm- no acute issues, continue IS Renal-labs pending, weight remains elevated on Lasix 40 mg daily Hyponatremia- labs pending GU- foley in place due to urethral stricture, this will be left in at discharge... he is having bladder spasms, have contacted pharmacy for recommendations, will start anti-spasmodic once recommendations received Dispo- patient back in NSR convert to oral Amiodarone today, anti-spasmodic for bladder spasms.[name] if remains stable for discharge home in AM   LOS: 4 days    Ellwood Handler, PA-C 01/23/2021  Patient seen and examined. I agree with the assessment and plan outlined above.  Revonda Standard Roxan Hockey, MD [facility] [phone]

## 2021-01-23 NOTE — Progress Notes (Signed)
CARDIAC REHAB PHASE I   PRE:  Rate/Rhythm: 65 SR  BP:  Sitting: 119/65      SaO2: 95 RA  MODE:  Ambulation: 300 ft   POST:  Rate/Rhythm: 83 SR  BP:  Sitting: 147/63    SaO2: 99 RA   Pt eager to ambulate. Pt ambulated 345ft in hallway standby assist with front wheel walker. Pt denies CP, SOB, or dizziness throughout session. Pt returned to bed due to pain at catheter site. Encouraged continued ambulation and IS use. Pt denies questions or concerns regarding yesterdays education. CRP II referral placed for [place].  [phone] Rufina Falco, RN BSN 01/23/2021 9:26 AM

## 2021-01-24 LAB — BASIC METABOLIC PANEL
Anion gap: 7 (ref 5–15)
BUN: 18 mg/dL (ref 8–23)
CO2: 22 mmol/L (ref 22–32)
Calcium: 8 mg/dL — ABNORMAL LOW (ref 8.9–10.3)
Chloride: 106 mmol/L (ref 98–111)
Creatinine, Ser: 1.11 mg/dL (ref 0.61–1.24)
GFR, Estimated: >60 mL/min (ref >60)
Glucose, Bld: 114 mg/dL — ABNORMAL HIGH (ref 70–99)
Potassium: 3.7 mmol/L (ref 3.5–5.1)
Sodium: 135 mmol/L (ref 135–145)

## 2021-01-24 MED ORDER — OXYCODONE HCL 5 MG PO TABS: 5.0000 mg | ORAL_TABLET | ORAL | 0 refills | Status: DC | PRN

## 2021-01-24 MED ORDER — POTASSIUM CHLORIDE CRYS ER 20 MEQ PO TBCR: 20.0000 meq | EXTENDED_RELEASE_TABLET | Freq: Every day | ORAL | 0 refills | Status: DC

## 2021-01-24 MED ORDER — CEPHALEXIN 500 MG PO CAPS: 500.0000 mg | ORAL_CAPSULE | Freq: Three times a day (TID) | ORAL | 0 refills | Status: AC

## 2021-01-24 MED ORDER — FUROSEMIDE 40 MG PO TABS
40.0000 mg | ORAL_TABLET | Freq: Every day | ORAL | 0 refills | Status: DC
Start: 2021-01-24 — End: 2021-03-04

## 2021-01-24 MED ORDER — AMIODARONE HCL 200 MG PO TABS: 200.0000 mg | ORAL_TABLET | Freq: Two times a day (BID) | ORAL | 1 refills | Status: DC

## 2021-01-24 MED ORDER — OXYBUTYNIN CHLORIDE 5 MG PO TABS: 5.0000 mg | ORAL_TABLET | Freq: Three times a day (TID) | ORAL | 1 refills | Status: DC

## 2021-01-24 MED ORDER — METOPROLOL TARTRATE 25 MG PO TABS: 25.0000 mg | ORAL_TABLET | Freq: Two times a day (BID) | ORAL | 1 refills | Status: DC

## 2021-01-24 NOTE — Progress Notes (Signed)
Pt discharged to home with family.  Pt taken off telemetry and CCMD notified.  Pt's IV removed.  Pt left with all of their personal belongings.  AVS documentation reviewed and sent home with Pt and all questions were answered.

## 2021-01-24 NOTE — Plan of Care (Signed)

## 2021-01-24 NOTE — Progress Notes (Signed)
      [address]       [place] [address]             [phone]      5 Days Post-Op Procedure(s) (LRB): CORONARY ARTERY BYPASS GRAFTING (CABG), ON PUMP TIMES FOUR, USING LEFT INTERNAL MAMMARY ARTERY AND ENDOSOCPICALLY HARVESTED RIGHT GREATER SAPHENOUS VEIN (N/A) TRANSESOPHAGEAL ECHOCARDIOGRAM (TEE) (N/A)  Subjective:  No new complaints.  Slept well last night.  Continues to have some bladder spasms, but overall improved.   Objective: Vital signs in last 24 hours: Temp:  [97.8 F (36.6 C)-98.9 F (37.2 C)] 98.1 F (36.7 C) (11/20 0740) Pulse Rate:  [53-68] 63 (11/20 1021) Cardiac Rhythm: Normal sinus rhythm;Sinus bradycardia (11/20 0730) Resp:  [12-21] 17 (11/20 1020) BP: (105-126)/(65-83) 121/76 (11/20 1020) SpO2:  [87 %-99 %] 97 % (11/20 1020) Weight:  [97.2 kg] 97.2 kg (11/20 0859)  Intake/Output from previous day: 11/19 0701 - 11/20 0700 In: 240 [P.O.:240] Out: 1700 [Urine:1700] Intake/Output this shift: Total I/O In: 240 [P.O.:240] Out: -   General appearance: alert, cooperative, and no distress Heart: regular rate and rhythm Lungs: clear to auscultation bilaterally Abdomen: soft, non-tender; bowel sounds normal; no masses,  no organomegaly Extremities: extremities normal, atraumatic, no cyanosis or edema Wound: clean and dry  Lab Results: Recent Labs    01/22/21 0231  WBC 8.6  HGB 11.5*  HCT 34.1*  PLT 125*   BMET:  Recent Labs    01/23/21 1028 01/24/21 0125  NA 136 135  K 3.7 3.7  CL 103 106  CO2 24 22  GLUCOSE 100* 114*  BUN 12 18  CREATININE 1.07 1.11  CALCIUM 8.5* 8.0*    PT/INR: No results for input(s): LABPROT, INR in the last 72 hours. ABG    Component Value Date/Time   PHART 7.355 01/19/2021 1807   HCO3 19.9 (L) 01/19/2021 1807   TCO2 21 (L) 01/19/2021 1807   ACIDBASEDEF 5.0 (H) 01/19/2021 1807   O2SAT 97.0 01/19/2021 1807   CBG (last 3)  Recent Labs    01/22/21 0608 01/22/21 1236 01/22/21 1619   GLUCAP 98 110* 110*    Assessment/Plan: S/P Procedure(s) (LRB): CORONARY ARTERY BYPASS GRAFTING (CABG), ON PUMP TIMES FOUR, USING LEFT INTERNAL MAMMARY ARTERY AND ENDOSOCPICALLY HARVESTED RIGHT GREATER SAPHENOUS VEIN (N/A) TRANSESOPHAGEAL ECHOCARDIOGRAM (TEE) (N/A)  CV- Sinus Bradycardia, no further A. Fib- continue Amiodarone, Lopressor Pulm- no acute issues, continue IS GU- bladder spasms with foley in place, improved with Ditropan.. will follow up with Urologist on outpatient basis Hyponatremia- stable, tapering lasix Dispo- patient stable, will d/c home today   LOS: 5 days    Ellwood Handler, PA-C 01/24/2021

## 2021-01-26 ENCOUNTER — Telehealth (HOSPITAL_COMMUNITY): Payer: Self-pay

## 2021-01-26 NOTE — Telephone Encounter (Signed)
Per phase I cardiac rehab, fax cardiac rehab referral to [place] [facility]. °

## 2021-01-27 ENCOUNTER — Other Ambulatory Visit: Payer: Self-pay | Admitting: Legal Medicine

## 2021-01-27 DIAGNOSIS — R8271 Bacteriuria: Secondary | ICD-10-CM | POA: Diagnosis not present

## 2021-01-27 MED ORDER — OXYCODONE HCL 5 MG PO TABS: 5.0000 mg | ORAL_TABLET | ORAL | 0 refills | Status: DC | PRN

## 2021-02-01 ENCOUNTER — Other Ambulatory Visit: Payer: Self-pay | Admitting: *Deleted

## 2021-02-01 ENCOUNTER — Ambulatory Visit
Admission: RE | Admit: 2021-02-01 | Discharge: 2021-02-01 | Disposition: A | Discharge status: Home / Self Care | Payer: Managed Care, Other (non HMO) | Source: Ambulatory Visit | Attending: Thoracic Surgery (Cardiothoracic Vascular Surgery) | Admitting: Thoracic Surgery (Cardiothoracic Vascular Surgery)

## 2021-02-01 ENCOUNTER — Telehealth: Payer: Self-pay | Admitting: *Deleted

## 2021-02-01 ENCOUNTER — Other Ambulatory Visit: Payer: Self-pay | Admitting: Thoracic Surgery (Cardiothoracic Vascular Surgery)

## 2021-02-01 DIAGNOSIS — R0602 Shortness of breath: Secondary | ICD-10-CM

## 2021-02-01 DIAGNOSIS — Z951 Presence of aortocoronary bypass graft: Secondary | ICD-10-CM

## 2021-02-01 LAB — ECHO INTRAOPERATIVE TEE
AR max vel: 3.34 cm2
AV Area VTI: 2.76 cm2
AV Area mean vel: 3.35 cm2
AV Mean grad: 2 mmHg
AV Peak grad: 5 mmHg
Ao pk vel: 1.12 m/s
Height: 71 in
MV VTI: 4.04 cm2
MV Vena cont: 0.3 cm
Weight: 3440 oz

## 2021-02-01 NOTE — Telephone Encounter (Signed)
Returned call to patient to follow up on message left from the weekend. Patient states he was experiencing pain s/p CABG 11/15 by Dr. [name]. Patient spoke with on call provider and received a refill of oxycodone. Patient states pain has gotten better since refill. Patient c/o SOB. Per patient, SOB has been constant since surgery. Patient states he started to take lasix yesterday instead of at hospital discharge due to incontinence. States he has been feeling a little better since taking. Advised patient to take medications as prescribed and to continue to use incentive spirometer. Advised patient to contact the office if SOB begins to worsen. Patient verbalizes understanding.

## 2021-02-01 NOTE — Telephone Encounter (Signed)
Order for cxr placed to evaluate cause of SOB. Macarthur Critchley, PA reviewed images. Order placed for left thoracentesis. Patient aware of order and verbalizes understanding.

## 2021-02-02 ENCOUNTER — Other Ambulatory Visit: Payer: Self-pay | Admitting: Thoracic Surgery (Cardiothoracic Vascular Surgery)

## 2021-02-02 ENCOUNTER — Ambulatory Visit (HOSPITAL_COMMUNITY)
Admission: RE | Admit: 2021-02-02 | Discharge: 2021-02-02 | Disposition: A | Discharge status: Home / Self Care | Payer: Managed Care, Other (non HMO) | Source: Ambulatory Visit | Attending: Thoracic Surgery (Cardiothoracic Vascular Surgery) | Admitting: Thoracic Surgery (Cardiothoracic Vascular Surgery)

## 2021-02-02 ENCOUNTER — Other Ambulatory Visit: Payer: Self-pay

## 2021-02-02 DIAGNOSIS — R0602 Shortness of breath: Secondary | ICD-10-CM

## 2021-02-02 DIAGNOSIS — J90 Pleural effusion, not elsewhere classified: Secondary | ICD-10-CM | POA: Diagnosis not present

## 2021-02-02 HISTORY — PX: IR THORACENTESIS ASP PLEURAL SPACE W/IMG GUIDE: IMG5380

## 2021-02-02 MED ORDER — LIDOCAINE HCL (PF) 1 % IJ SOLN
INTRAMUSCULAR | Status: DC | PRN
  Administered 2021-02-02: 10 mL

## 2021-02-02 NOTE — Procedures (Signed)
PROCEDURE SUMMARY:  Successful image-guided left thoracentesis. Yielded 1.85 liters of bloody fluid. Patient tolerated procedure well. EBL < 1 mL No immediate complications.  Specimen was not sent for labs. Post procedure CXR shows no pneumothorax.  Please see imaging section of Epic for full dictation.  Joaquim Nam PA-C 02/02/2021 10:24 AM

## 2021-02-03 ENCOUNTER — Telehealth: Payer: Self-pay

## 2021-02-03 NOTE — Telephone Encounter (Signed)
Transition Care Management Unsuccessful Follow-up Telephone Call  Date of discharge and from where:  01/24/2021   [facility]  Attempts:  1st Attempt  Reason for unsuccessful TCM follow-up call:  No answer/busy  Tomasa Rand, RN, BSN, CEN [place] Coordinator [phone]

## 2021-02-04 ENCOUNTER — Telehealth: Payer: Self-pay | Admitting: *Deleted

## 2021-02-04 NOTE — Telephone Encounter (Signed)
Transition Care Management Unsuccessful Follow-up Telephone Call  Date of discharge and from where: [facility]  01/24/2021  Attempts:  2nd Attempt  Reason for unsuccessful TCM follow-up call:  No answer/busy  Jacqlyn Larsen [facility], BSN RN Case Manager [phone]

## 2021-02-05 ENCOUNTER — Other Ambulatory Visit: Payer: Self-pay

## 2021-02-05 ENCOUNTER — Other Ambulatory Visit: Payer: Self-pay | Admitting: *Deleted

## 2021-02-05 ENCOUNTER — Ambulatory Visit (INDEPENDENT_AMBULATORY_CARE_PROVIDER_SITE_OTHER): Payer: Self-pay | Admitting: Thoracic Surgery (Cardiothoracic Vascular Surgery)

## 2021-02-05 DIAGNOSIS — Z951 Presence of aortocoronary bypass graft: Secondary | ICD-10-CM

## 2021-02-05 DIAGNOSIS — R058 Other specified cough: Secondary | ICD-10-CM

## 2021-02-05 NOTE — Progress Notes (Signed)
     [address]       [place] [address]             [phone]       Patient: Home Provider: Office Consent for Telemedicine visit obtained.  Today's visit was completed via a real-time telehealth (see specific modality noted below). The patient/authorized person provided oral consent at the time of the visit to engage in a telemedicine encounter with the present provider at [facility]. The patient/authorized person was informed of the potential benefits, limitations, and risks of telemedicine. The patient/authorized person expressed understanding that the laws that protect confidentiality also apply to telemedicine. The patient/authorized person acknowledged understanding that telemedicine does not provide emergency services and that he or she would need to call 911 or proceed to the nearest hospital for help if such a need arose.   Total time spent in the clinical discussion 10 minutes.  Telehealth Modality: Phone visit (audio only)  I had a telephone visit with  [name] who is s/p CABG.  He is undergone a thoracentesis once with removal of 2 L.  His respiratory status is improved.  Overall doing well.  Pain is minimal.  Ambulating well. Vitals have been stable.  [name] will see [place] back in 1 month with a chest x-ray for cardiac rehab clearance.  Lacee Grey Bary Leriche

## 2021-02-08 ENCOUNTER — Other Ambulatory Visit: Payer: Self-pay | Admitting: *Deleted

## 2021-02-08 MED ORDER — TRAMADOL HCL 50 MG PO TABS: 50.0000 mg | ORAL_TABLET | Freq: Four times a day (QID) | ORAL | 0 refills | Status: DC | PRN

## 2021-02-08 NOTE — Progress Notes (Signed)
Patient contacted the office c/o pain s/p CABG 11/15 by Dr. [name]. Patient was discharged from the hospital on oxycodone but ran out over the weekend. Per patient he has been taking Tylenol for pain relief but it isn't easing his pain. Per patient surgical incisions without redness, drainage, or warmth. Per Leretha Pol, PA, prescription of tramadol sent into patient's preferred pharmacy. Patient aware and verbalizes understanding.

## 2021-02-11 ENCOUNTER — Other Ambulatory Visit: Payer: Self-pay

## 2021-02-11 DIAGNOSIS — I25110 Atherosclerotic heart disease of native coronary artery with unstable angina pectoris: Secondary | ICD-10-CM

## 2021-02-11 MED ORDER — CLONAZEPAM 1 MG PO TABS: 1.0000 mg | ORAL_TABLET | Freq: Two times a day (BID) | ORAL | 3 refills | Status: DC | PRN

## 2021-02-12 ENCOUNTER — Other Ambulatory Visit: Payer: Self-pay | Admitting: Cardiovascular Disease

## 2021-02-15 ENCOUNTER — Other Ambulatory Visit: Payer: Self-pay | Admitting: Physician Assistant

## 2021-02-18 ENCOUNTER — Ambulatory Visit (INDEPENDENT_AMBULATORY_CARE_PROVIDER_SITE_OTHER): Payer: Managed Care, Other (non HMO) | Admitting: Cardiovascular Disease

## 2021-02-18 ENCOUNTER — Encounter: Payer: Self-pay | Admitting: Cardiovascular Disease

## 2021-02-18 ENCOUNTER — Other Ambulatory Visit: Payer: Self-pay

## 2021-02-18 DIAGNOSIS — I2510 Atherosclerotic heart disease of native coronary artery without angina pectoris: Secondary | ICD-10-CM | POA: Diagnosis not present

## 2021-02-18 DIAGNOSIS — Z79899 Other long term (current) drug therapy: Secondary | ICD-10-CM

## 2021-02-18 DIAGNOSIS — E785 Hyperlipidemia, unspecified: Secondary | ICD-10-CM | POA: Diagnosis not present

## 2021-02-18 DIAGNOSIS — I10 Essential (primary) hypertension: Secondary | ICD-10-CM | POA: Diagnosis not present

## 2021-02-18 DIAGNOSIS — J90 Pleural effusion, not elsewhere classified: Secondary | ICD-10-CM

## 2021-02-18 DIAGNOSIS — D751 Secondary polycythemia: Secondary | ICD-10-CM

## 2021-02-18 DIAGNOSIS — Z5181 Encounter for therapeutic drug level monitoring: Secondary | ICD-10-CM

## 2021-02-18 DIAGNOSIS — Z951 Presence of aortocoronary bypass graft: Secondary | ICD-10-CM

## 2021-02-18 MED ORDER — METOPROLOL SUCCINATE ER 25 MG PO TB24: 12.5000 mg | ORAL_TABLET | Freq: Every day | ORAL | 2 refills | Status: DC

## 2021-02-18 NOTE — Patient Instructions (Signed)
Medication Instructions:   -Stop taking metoprolol tartrate (lopressor).  -Start taking metoprolol succinate (toprol-xl) 12.5mg  once daily.  *If you need a refill on your cardiac medications before your next appointment, please call your pharmacy*   Follow-Up: At [facility], you and your health needs are our priority.  As part of our continuing mission to provide you with exceptional heart care, we have created designated Provider Care Teams.  These Care Teams include your primary Cardiologist (physician) and Advanced Practice Providers (APPs -  Physician Assistants and Nurse Practitioners) who all work together to provide you with the care you need, when you need it.  We recommend signing up for the patient portal called "MyChart".  Sign up information is provided on this After Visit Summary.  MyChart is used to connect with patients for Virtual Visits (Telemedicine).  Patients are able to view lab/test results, encounter notes, upcoming appointments, etc.  Non-urgent messages can be sent to your provider as well.   To learn more about what you can do with MyChart, go to [email or web address].    Your next appointment:   3 month(s)  The format for your next appointment:   In Person  Provider:   [name], MD

## 2021-02-18 NOTE — Progress Notes (Signed)
Cardiology Office Note    Date:  02/25/2021   ID:  KOAL ESLINGER, DOB 02-Mar-1956, MRN [id number]  PCP:  [name], MD  Cardiologist:  [name], MD    6 week F/U s/p CABG  History of Present Illness:  Jacob Collier is a 65 y.o. male who is a former patient of Dr. Debara Pickett.  He last saw Dr. Debara Pickett in 2018.  He has known CAD since 2018 has been followed by cardiology in [facility].  He is status post PCI to the proximal to mid RCA at catheterization on January 04, 2019 and had known CTO of his circumflex.  He had undergone repeat catheterization in March 2021 showing in-stent restenosis of his RCA stent which progressed to chronic total occlusion as well as chronic total occlusion of his circumflex.  His LAD was patent.  He had been seen by his cardiologist when he presented to [facility] on June 07, 2019 requesting a second opinion.  At that time I saw him in the hospital and ultimately was able to obtain the angiographic studies that were done at [facility] for his most recent catheterization of May 25, 2019.  When that time I reviewed the angiographic films with Dr. Martinique who did not feel his CTO of his RCA was amenable to intervention due to lack of any beak large occlusion at the site of a small branch.  Complex was not amenable to intervention.  He did have some concomitant CAD.  After much discussion I reviewed the data with the patient and felt that medical therapy was the best option.  There was some concern in the past and he did not benefit from initial dosing of ranolazine.  He was discharged the following day and increased medical regimen consisting of isosorbide 30 mg, amlodipine, aspirin and Plavix in addition to his previous lisinopril.  He was bradycardic in the 50s.  He also was treated with high potency statin therapy and atorvastatin was increased to 80 mg.  He presented to the emergency room on June 11, 2019 with palpitations and neck tightness.  EMS  reported a narrow complex tachycardia at a rate of 160 bpm.  Upon arrival to the emergency room he was in sinus rhythm with PVCs and bigeminy.  He was suspected to have had very brief episode of SVT and the plan was to start Toprol-XL 25 mg daily.  I saw him after his ER evaluation in June 25, 2019.  He had  decided to continue his cardiology care with me since his hospitalization.  He has felt improved and denies any significant increasing chest pain symptoms or recurrent episodes of SVT.[name]  He has noticed development of myalgias since his increased atorvastatin dose.  He also has noticed a mild cough on lisinopril.  During that evaluation, in light of his cough sensitization I recommended switching to losartan who started him on 25 mg daily.  Due to issues with atorvastatin causing myalgias I recommended he reduce his dose to 20 mg and also recommended initiation of Zetia 10 mg.  We discussed if he continued to have increasing anginal symptomatology.  Repeat challenge with Ranexa could be undertaken.  He was subsequently evaluated by Sande Rives, PA-C in June 2021.  He had a cardiac monitor which showed predominant sinus rhythm at 65 bpm.  The slowest heart rate was sinus bradycardia at 42 bpm the fastest heart rate was sinus tachycardia 164 bpm.  He had occasional PVCs with  a ventricular couplet and several episodes of short-lived ventricular bigeminal rhythm.  There was a 4 beat episode of nonsustained VT, 92 bpm.  He also had another episode of SVT lasting 36 seconds.  I saw him in September 2021.  Since he has been on a beta-blocker therapy, he denied any recurrent tachycardia dysrhythmias.  He was on a regimen consisting of amlodipine 2.5 mg, isosorbide 30 mg but he is taking this 50 mg twice a day, losartan 25 mg, Toprol-XL 25 mg daily.  He continues to be on DAPT with aspirin/Plavix.  He is tolerating atorvastatin 20 mg and Zetia 10 mg for his hyperlipidemia.  He has noticed some episodes of mild  chest discomfort.  He denied recurrent SVT and is asked he feels this was anxiety mediated and he had been placed on low-dose Klonopin by his primary provider.   I saw him on April 01, 2020.  His palpitations are less since he has been taking Klonopin.  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  He has had urinary issues and apparently has dark urine.  He is being evaluated by Dr. Vikki Ports of urology and may require bladder sphincter surgery.  He has undergone urinalysis.  He was told perhaps that some of his dark urine may read present a metabolic issue.  He has a history of prostatectomy and radiation treatment.  He admits to significant myalgias and has been on atorvastatin 20 mg and Zetia 10 mg for hyperlipidemia.  During that evaluation, I recommended he discontinue atorvastatin and try rosuvastatin 10 mg every other day along with Zetia and attempt to achieve a target LDL goal less than 70.  He was on aspirin/Plavix for DAPT and amlodipine 5 mg, isosorbide 15 mg twice a day, metoprolol succinate 25 mg and losartan 12.5 mg for hypertension/CAD.  He denied any recent anginal symptoms.    When I saw him on June 12, 2020  he had discontinued rosuvastatin.  He works all day standing on hard concrete and he felt that both atorvastatin and rosuvastatin were contributing to some myalgias.  ESR on April 01, 2020 was normal at 9. He denied any anginal symptoms.  At times he does note some mild shortness of breath with activity.  He was unaware of palpitations.  During that evaluation, I recommended a trial of Livalo which oftentimes can be tolerated in patients with otherwise statin intolerance.  With his known CAD with CTO's of his circumflex and RCA I recommended slight titration of isosorbide recommended he take 30 mg in the morning and 60 mg at night and slightly titrated metoprolol to 37.5 mg twice a day.  I recommended he undergo a follow-up echo Doppler evaluation.  I saw him on August 13, 2020.   He underwent a follow-up echo Doppler study on Jul 22, 2020 which showed an EF of 50 to 09%, grade 2 diastolic dysfunction and mild eccentric LVH of the septum.  There was hypokinesis involving the anterolateral posterior and basal inferior wall.  There was mild ascending aortic dilatation at 41 mm mild left atrial dilatation.  He states that he developed an episode of angina on Aug 01, 2019 which led to ER evaluation on May 30.  Troponins were negative.  He has had issues with increased anxiety and panic attacks and had called his primary physician since he felt he needed a new prescription for his clonazepam.  He states he did not tolerate Livalo.  However his brother also had intolerance to  many statins has been able to tolerate pravastatin 80 and he suggested perhaps a trial of this may be beneficial.  Of note, recent laboratory from his evaluation revealed progressive increase in hemoglobin now at 19.3 with hematocrit of 57.3.  Platelets 197,000.  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  He denied recurrent chest pain since his angina at the end of May.  During that evaluation, I recommended he see hematology for evaluation of his polycythemia and was evaluated by Dr. Narda Rutherford of hematology/oncology.  It was felt that his polycythemia most likely was secondary to either obstructive sleep apnea or exogenous testosterone injections and laboratory was ordered.  Mr. Burby was seen by Sande Rives in the office on December 09, 2020.  With complaints of increasing chest pain and palpitations definitive cardiac catheterization was recommended.  He was scheduled to undergo cardiac catheterization with me on December 24, 2020 but due to my family emergency, I was out of town and the procedure was done by Dr. Martinique.  Catheterization revealed severe three-vessel CAD with 75% mid LAD stenosis,  80% first diagonal stenosis, previously noted total occlusion of the mid circumflex and proximal RCA.  Compared to his prior cath of March 2021, the LAD stenosis was new.  There was mild LV dysfunction with inferobasal hypokinesis with EF of 50%.   I saw him in follow-up of his cardiac catheterization on January 07, 2021.  He had been seen by  Dr. [name] of cardiothoracic surgery on January 01, 2021 and was tentatively scheduled to undergo CABG revascularization surgery on January 19, 2021.  However, the patient has had issues with urethral stricture and is in need to undergo cystoscopy with balloon dilatation by Dr. Bjorn Loser on January 14, 2021.  He has had intolerance to statin therapy and is now on Zetia with plans to initiate Repatha following his CABG.  Presently he is on amlodipine 5 mg, isosorbide 30 mg in the morning and 50 mg in the evening in addition to metoprolol 25 mg.  He is on DAPT with aspirin/Plavix.  During that evaluation, he was advised on the need to hold his Plavix for his urologic procedure and stay off Plavix for his CABG revascularization scheduled on January 19, 2021.  Mr. Passow underwent CABG x4 revascularization surgery successfully on November 15 with LIMA to LAD, SVG to OM1, SVG to first diagonal, and SVG to PDA.  He developed atrial fibrillation and was treated with amiodarone. Subsequently, he developed significant pleural effusion and following discharge underwent thoracentesis on January 26, 2021 with removal of 2 L of fluid.  He was subsequently evaluated by Dr. [name] on February 05, 2021 and was stable from his perspective.  Presently, Mr. Schreur is breathing much better.  He denies any anginal symptoms.  He had previously developed lower extremity edema but this has resolved since his hospitalization.  He continues to be on amiodarone 200 mg daily and is unaware of any recurrent A. fib.  He continues to be on amlodipine 5 mg, metoprolol tartrate 12.5 mg twice  a day, furosemide 40 mg daily.  He is on Zetia 10 mg.  He presents for evaluation.   Past Medical History:  Diagnosis Date   Arthritis    Coronary artery disease    s/p prior stenting to RCA. Last cath in 05/2019 showed in-stent CTO of RCA and CTO of  LCX (medical therpay recommended)   Diverticulosis    Dyspnea    Hard of hearing    Hyperlipidemia    Hypertension    hx of elevation on lyrica, off med and now normal    Paroxysmal SVT (supraventricular tachycardia) (HCC)    Prostate cancer ([place])    Wears glasses     Past Surgical History:  Procedure Laterality Date   CARDIAC CATHETERIZATION     with 2 stents    COLECTOMY  05/2018   COLONOSCOPY     CORONARY ARTERY BYPASS GRAFT N/A 01/19/2021   Procedure: CORONARY ARTERY BYPASS GRAFTING (CABG), ON PUMP TIMES FOUR, USING LEFT INTERNAL MAMMARY ARTERY AND ENDOSOCPICALLY HARVESTED RIGHT GREATER SAPHENOUS VEIN;  Surgeon: Lajuana Matte, MD;  Location: [place];  Service: Open Heart Surgery;  Laterality: N/A;   CYSTOSCOPY WITH URETHRAL DILATATION N/A 01/14/2021   Procedure: CYSTOSCOPY WITH BALLOON  DILATATION;  Surgeon: Bjorn Loser, MD;  Location: [facility];  Service: Urology;  Laterality: N/A;   EYE SURGERY  02/2020   bilaterl cataracts   IR THORACENTESIS ASP PLEURAL SPACE W/IMG GUIDE  02/02/2021   LEFT HEART CATH AND CORONARY ANGIOGRAPHY N/A 12/24/2020   Procedure: LEFT HEART CATH AND CORONARY ANGIOGRAPHY;  Surgeon: Martinique, Peter M, MD;  Location: [place] [facility];  Service: Cardiovascular;  Laterality: N/A;   PROSTATECTOMY     TEE WITHOUT CARDIOVERSION N/A 01/19/2021   Procedure: TRANSESOPHAGEAL ECHOCARDIOGRAM (TEE);  Surgeon: Lajuana Matte, MD;  Location: [place];  Service: Open Heart Surgery;  Laterality: N/A;   TOTAL HIP ARTHROPLASTY Left 11/10/2015   Procedure: LEFT TOTAL HIP ARTHROPLASTY ANTERIOR APPROACH;  Surgeon: Mcarthur Rossetti, MD;  Location: [place];  Service: Orthopedics;  Laterality: Left;    Current  Medications: Outpatient Medications Prior to Visit  Medication Sig Dispense Refill   acetaminophen (TYLENOL) 500 MG tablet Take 1,000 mg by mouth every 8 (eight) hours as needed for mild pain.     amiodarone (PACERONE) 200 MG tablet Take 1 tablet (200 mg total) by mouth 2 (two) times daily. X 7 days, then decrease to 200 mg daily 60 tablet 1   amLODipine (NORVASC) 5 MG tablet TAKE 1 TABLET BY MOUTH EVERY DAY 90 tablet 3   aspirin EC 81 MG tablet Take 81 mg by mouth daily. Swallow whole.     clonazePAM (KLONOPIN) 1 MG tablet Take 1 tablet (1 mg total) by mouth 2 (two) times daily as needed for anxiety. 60 tablet 3   clopidogrel (PLAVIX) 75 MG tablet Take 75 mg by mouth daily. Stop taking plavix on Saturday Nov. 5, 2022     ezetimibe (ZETIA) 10 MG tablet TAKE 1 TABLET BY MOUTH EVERY DAY (Patient taking differently: Take 10 mg by mouth daily.) 90 tablet 3   nitroGLYCERIN (NITROSTAT) 0.4 MG SL tablet Place 1 tablet (0.4 mg total) under the tongue every 5 (five) minutes as needed for chest pain. 25 tablet 2   Omega-3 Fatty Acids (FISH OIL) 1200 MG CAPS Take 2,400 mg by mouth 2 (two) times daily.     Eszopiclone 3 MG TABS TAKE 1 TABLET BY MOUTH EVERYDAY AT BEDTIME (Patient taking differently: Take 3 mg by mouth at bedtime.) 30 tablet 2   metoprolol tartrate (LOPRESSOR) 25 MG tablet Take 1 tablet (25 mg total) by mouth 2 (two) times daily. (Patient taking differently: Take 12.5 mg by mouth 2 (two) times daily.) 60 tablet 1   furosemide (LASIX) 40 MG tablet Take 1 tablet (40 mg  total) by mouth daily. (Patient not taking: Reported on 02/18/2021) 3 tablet 0   oxybutynin (DITROPAN) 5 MG tablet Take 1 tablet (5 mg total) by mouth 3 (three) times daily. (Patient not taking: Reported on 02/18/2021) 90 tablet 1   potassium chloride SA (KLOR-CON) 20 MEQ tablet Take 1 tablet (20 mEq total) by mouth daily. (Patient not taking: Reported on 02/18/2021) 3 tablet 0   traMADol (ULTRAM) 50 MG tablet Take 1 tablet (50 mg  total) by mouth every 6 (six) hours as needed. (Patient not taking: Reported on 02/18/2021) 28 tablet 0   No facility-administered medications prior to visit.     Allergies:   Ranolazine and Statins   Social History   Socioeconomic History   Marital status: Married    Spouse name: Not on file   Number of children: 0   Years of education: Not on file   Highest education level: Not on file  Occupational History   Occupation: Employed at [employer]  Tobacco Use   Smoking status: Never   Smokeless tobacco: Never  Vaping Use   Vaping Use: Never used  Substance and Sexual Activity   Alcohol use: Not Currently    Alcohol/week: 0.0 standard drinks    Comment: rarely   Drug use: No   Sexual activity: Yes    Partners: Female    Birth control/protection: None  Other Topics Concern   Not on file  Social History Narrative   Not on file   Social Determinants of Health   Financial Resource Strain: Not on file  Food Insecurity: Not on file  Transportation Needs: Not on file  Physical Activity: Not on file  Stress: Not on file  Social Connections: Not on file     Family History:  The patient's family history includes Alzheimer's disease in his mother; Cancer in his maternal uncle and maternal uncle; Colon polyps in his brother; Diabetes in his sister; Heart attack in his maternal grandmother; Prostate cancer in his maternal uncle and maternal uncle; Stroke in his father.   ROS General: Negative; No fevers, chills, or night sweats;  HEENT: Negative; No changes in vision or hearing, sinus congestion, difficulty swallowing Pulmonary: Thoracentesis following CABG surgery with removal of 2 L of fluid Cardiovascular: See HPI GI: Negative; No nausea, vomiting, diarrhea, or abdominal pain GU: h/o prostatectomy, radiation treatment.  Need for bladder sphincter surgery.  Complains of dark urine. Musculoskeletal: Myalgias Hematologic/Oncology: Polycythemia, presumed to be  secondary Endocrine: Negative; no heat/cold intolerance; no diabetes Neuro: Negative; no changes in balance, headaches Skin: Negative; No rashes or skin lesions Psychiatric: History of anxiety and panic attacks Sleep: Negative; No snoring, daytime sleepiness, hypersomnolence, bruxism, restless legs, hypnogognic hallucinations, no cataplexy Other comprehensive 14 point system review is negative.   PHYSICAL EXAM:   VS:  BP 138/78    Pulse (!) 48    Ht 5' 11"  (1.803 m)    Wt 206 lb (93.4 kg)    SpO2 98%    BMI 28.73 kg/m     Repeat blood pressure by me 128/74  Wt Readings from Last 3 Encounters:  02/18/21 206 lb (93.4 kg)  01/24/21 214 lb 4.6 oz (97.2 kg)  01/15/21 220 lb 8 oz (100 kg)     General: Alert, oriented, no distress.  Skin: normal turgor, no rashes, warm and dry HEENT: Normocephalic, atraumatic. Pupils equal round and reactive to light; sclera anicteric; extraocular muscles intact;  Nose without nasal septal hypertrophy Mouth/Parynx benign; Mallinpatti scale 3 Neck:  No JVD, no carotid bruits; normal carotid upstroke Lungs: clear to ausculatation and percussion; no wheezing or rales Chest wall: without tenderness to palpitation Heart: PMI not displaced, RRR, s1 s2 normal, 1/6 systolic murmur, no diastolic murmur, no rubs, gallops, thrills, or heaves Abdomen: soft, nontender; no hepatosplenomehaly, BS+; abdominal aorta nontender and not dilated by palpation. Back: no CVA tenderness Pulses 2+ Musculoskeletal: full range of motion, normal strength, no joint deformities Extremities: no clubbing cyanosis or edema, Homan's sign negative  Neurologic: grossly nonfocal; Cranial nerves grossly wnl Psychologic: Normal mood and affect    Studies/Labs Reviewed:   February 18, 2021 ECG (independently read by me):  Sinus bradycardia at 48, PAC, QTc 493 msec  January 07, 2021 ECG (independently read by me):  Sinus bradycardia at 59; small inferior Q waves, no ST changes  August 13, 2020 ECG (independently read by me): sinus rhythm at 65; PAC, QTc 480 msec  June 12, 2020 ECG (independently read by me): NSR at 62; NSST changes  January 2022 ECG (independently read by me): NSR at 62; LVH, small inferior Q waves  September 2021 ECG (independently read by me):Normal sinus rhythm at 61 bpm.  Normal intervals.  No ectopy.  April 2021 ECG (independently read by me): Sinus bradycardia 54 bpm.  Small Q-wave in lead III.  No ectopy.  Nonspecific ST changes.  Recent Labs: BMP Latest Ref Rng & Units 01/24/2021 01/23/2021 01/22/2021  Glucose 70 - 99 mg/dL 114(H) 100(H) 94  BUN 8 - 23 mg/dL 18 12 15   Creatinine 0.61 - 1.24 mg/dL 1.11 1.07 0.95  BUN/Creat Ratio 10 - 24 - - -  Sodium 135 - 145 mmol/L 135 136 129(L)  Potassium 3.5 - 5.1 mmol/L 3.7 3.7 3.8  Chloride 98 - 111 mmol/L 106 103 100  CO2 22 - 32 mmol/L 22 24 22   Calcium 8.9 - 10.3 mg/dL 8.0(L) 8.5(L) 8.2(L)     Hepatic Function Latest Ref Rng & Units 01/23/2021 01/15/2021 11/16/2020  Total Protein 6.5 - 8.1 g/dL 5.8(L) 6.5 6.7  Albumin 3.5 - 5.0 g/dL 2.9(L) 3.7 4.5  AST 15 - 41 U/L 19 27 16   ALT 0 - 44 U/L 18 42 23  Alk Phosphatase 38 - 126 U/L 50 64 75  Total Bilirubin 0.3 - 1.2 mg/dL 0.9 0.6 0.4  Bilirubin, Direct 0.0 - 0.2 mg/dL - - -    CBC Latest Ref Rng & Units 01/22/2021 01/21/2021 01/20/2021  WBC 4.0 - 10.5 K/uL 8.6 9.7 12.1(H)  Hemoglobin 13.0 - 17.0 g/dL 11.5(L) 12.2(L) 13.2  Hematocrit 39.0 - 52.0 % 34.1(L) 36.5(L) 37.9(L)  Platelets 150 - 400 K/uL 125(L) 114(L) 133(L)   Lab Results  Component Value Date   MCV 89.0 01/22/2021   MCV 90.6 01/21/2021   MCV 88.6 01/20/2021   No results found for: TSH Lab Results  Component Value Date   HGBA1C 5.8 (H) 01/15/2021     BNP    Component Value Date/Time   BNP 67.2 06/05/2019 2231    ProBNP No results found for: PROBNP   Lipid Panel     Component Value Date/Time   CHOL 181 11/16/2020 1138   TRIG 145 11/16/2020 1138   HDL 41 11/16/2020  1138   CHOLHDL 4.4 11/16/2020 1138   CHOLHDL 4.8 06/06/2019 0612   VLDL 29 06/06/2019 0612   LDLCALC 114 (H) 11/16/2020 1138   LABVLDL 26 11/16/2020 1138     RADIOLOGY: DG Chest 1 View  Result Date: 02/02/2021 CLINICAL DATA:  Left thoracentesis EXAM: CHEST  1 VIEW COMPARISON:  Chest x-ray dated February 01, 2021 FINDINGS: Visualized cardiac and mediastinal contours are unchanged, status post median sternotomy. Small left pleural effusion with associated atelectasis, decreased in size when compared with prior radiograph. New evidence of pneumothorax IMPRESSION: No evidence of pneumothorax status post thoracentesis. Electronically Signed   By: Yetta Glassman M.D.   On: 02/02/2021 10:42   DG Chest 2 View  Result Date: 02/01/2021 CLINICAL DATA:  Coronary bypass, shortness of breath EXAM: CHEST - 2 VIEW COMPARISON:  01/21/2021 FINDINGS: Moderate left effusion with left mid and lower lung collapse/consolidation. Heart is enlarged. Coronary bypass changes noted. Trace right effusion. Right lung remains clear. No pneumothorax. Trachea midline. Degenerative changes of the spine. IMPRESSION: Moderate left effusion with left mid and lower lung collapse/consolidation. Coronary bypass changes. Electronically Signed   By: Jerilynn Mages.  Shick M.D.   On: 02/01/2021 16:05   IR THORACENTESIS ASP PLEURAL SPACE W/IMG GUIDE  Result Date: 02/02/2021 INDICATION: Patient with history of CABG 01/19/2021 now worsening dyspnea, onset left-sided pleural effusion. Request for therapeutic thoracentesis. EXAM: ULTRASOUND GUIDED LEFT THORACENTESIS MEDICATIONS: 8 mL 1% lidocaine COMPLICATIONS: None immediate. PROCEDURE: An ultrasound guided thoracentesis was thoroughly discussed with the patient and questions answered. The benefits, risks, alternatives and complications were also discussed. The patient understands and wishes to proceed with the procedure. Written consent was obtained. Ultrasound was performed to localize and mark an  adequate pocket of fluid in the left chest. The area was then prepped and draped in the normal sterile fashion. 1% Lidocaine was used for local anesthesia. Under ultrasound guidance a 6 Fr Safe-T-Centesis catheter was introduced. Thoracentesis was performed. The catheter was removed and a dressing applied. FINDINGS: A total of approximately 1.8 L of bloody fluid was removed. IMPRESSION: Successful ultrasound guided left thoracentesis yielding 1.8 L of pleural fluid. Read by Candiss Norse, PA-C Electronically Signed   By: Miachel Roux M.D.   On: 02/02/2021 11:32     Additional studies/ records that were reviewed today include:  I reviewed the patient's most recent hospitalization, and while in the hospital was able to review his outside angiographic films.    Prox LAD to Mid LAD lesion is 75% stenosed.   1st Diag lesion is 80% stenosed.   Prox Cx to Mid Cx lesion is 100% stenosed.   Prox RCA to Mid RCA lesion is 100% stenosed.   The left ventricular systolic function is normal.   LV end diastolic pressure is mildly elevated.   The left ventricular ejection fraction is 50-55% by visual estimate.   There is no aortic valve stenosis.   3 vessel obstructive CAD. 75% mid LAD, 80% first diagonal, 100% mid LCx and 100% proximal RCA. Compared to prior cath in March 2021 the LAD stenosis is new.  Mild LV dysfunction with inferobasal HK. EF 50% Mildly elevated LVEDP   Plan: needs CT surgery evaluation for CABG. Will hold Plavix. Plan to refer to Pharm D for PCSK 9 inhibitor.      ASSESSMENT:    1. Coronary artery disease involving native coronary artery of native heart without angina pectoris   2. S/P CABG x 4: January 19, 2021   3. Hyperlipidemia with target LDL less than 70   4. Essential hypertension   5. Pleural effusion; s/p thoracentesis January 26, 2021   6. Encounter for monitoring amiodarone therapy   7. Hyperlipidemia LDL goal <70   8. Polycythemia     PLAN:  Jacob Collier  is  a 65 year-old gentleman who has known CAD and underwent initial PCI to his RCA in October 2020 at which time he also had known CTO of his circumflex.  He has a history of hypertension and hyperlipidemia and subsequently developed CTO of his RCA which upon angiographic review was not amenable for attempt at intervention.  He has been documented to have SVT which has improved with initiation of metoprolol succinate.  He believes his episodes have been anxiety mediated and since he had been placed on low-dose Klonopin he is not experiencing any tachydysrhythmia.  When I saw him in April 2022 he was not having recurrent anginal symptomatology but had experienced  some increase in shortness of breath when very active at work.  In the past, there is some issue regarding Ranexa intolerance.  He experienced myalgias with atorvastatin and on a trial of every other day rosuvastatin he also felt similar symptoms and discontinued therapy.  Most recently he is only been on Zetia.  In addition, he subsequently had a trial of Livalo and also pravastatin.  Due to statin intolerance he will need initiation of PSK 9 inhibition which will be set up following his CABG revascularization.  He underwent successful CABG revascularization in November 48 teen 2022 by Dr. [name] received a LIMA to LAD, SVG to PDA, SVG to diagonal and SVG to OM vessel.  He developed postoperative atrial fibrillation for which she was treated with amiodarone at 200 mg mg twice a day for 1 week and he is now on 200 mg daily.  His ECG today shows sinus bradycardia at 48 bpm.  He also has been on metoprolol.  He will be seeing Dr. [name] immediately after the new year.  At this point I will continue him on his present dose of amiodarone and ultimately this may be able to be weaned and discontinued.  I will change his metoprolol to tartrate to metoprolol succinate 12.5 mg daily.  He underwent successful thoracentesis with removal of 2 L of pleural fluid  following his CABG revascularization.  His previous lower extremity edema has resolved.  He has been on Zetia 10 mg and with his statin intolerance, he is a candidate for PCSK9 inhibition and attempt to achieve target LDL less than 55.  I will try to initiate this process to her pharmacy for approval. He will be undergoing follow-up hematology evaluation for his increased hemoglobin and hematocrit.  I will see him in 3 months for follow-up evaluation.    Medication Adjustments/Labs and Tests Ordered: Current medicines are reviewed at length with the patient today.  Concerns regarding medicines are outlined above.  Medication changes, Labs and Tests ordered today are listed in the Patient Instructions below. Patient Instructions  Medication Instructions:   -Stop taking metoprolol tartrate (lopressor).  -Start taking metoprolol succinate (toprol-xl) 12.50m once daily.  *If you need a refill on your cardiac medications before your next appointment, please call your pharmacy*   Follow-Up: [facility] and your health needs are our priority.  As part of our continuing mission to provide you with exceptional heart care, we have created designated Provider Care Teams.  These Care Teams include your primary Cardiologist (physician) and Advanced Practice Providers (APPs -  Physician Assistants and Nurse Practitioners) who all work together to provide you with the care you need, when you need it.  We recommend signing up for the patient portal called "MyChart".  Sign up information is provided on this After Visit Summary.  MyChart is used to connect with patients for Virtual Visits (Telemedicine).  Patients are able to view lab/test results, encounter notes, upcoming appointments, etc.  Non-urgent messages can be sent to your provider as well.   To learn more about what you can do with MyChart, go to [email or web address].    Your next appointment:   3 month(s)  The format for your next  appointment:   In Person  Provider:   [name], MD   Signed, [name], MD  02/25/2021 11:21 AM    [facility] [address], [place], [place], [place]  [address] Phone: [phone]  8.I did okay

## 2021-02-19 ENCOUNTER — Other Ambulatory Visit: Payer: Managed Care, Other (non HMO) | Admitting: Legal Medicine

## 2021-02-20 ENCOUNTER — Other Ambulatory Visit: Payer: Self-pay | Admitting: Legal Medicine

## 2021-02-20 DIAGNOSIS — F5101 Primary insomnia: Secondary | ICD-10-CM

## 2021-02-24 ENCOUNTER — Ambulatory Visit: Payer: Managed Care, Other (non HMO) | Admitting: Legal Medicine

## 2021-02-25 ENCOUNTER — Encounter: Payer: Self-pay | Admitting: Cardiovascular Disease

## 2021-03-04 ENCOUNTER — Encounter: Payer: Self-pay | Admitting: Legal Medicine

## 2021-03-04 ENCOUNTER — Ambulatory Visit (INDEPENDENT_AMBULATORY_CARE_PROVIDER_SITE_OTHER): Payer: Managed Care, Other (non HMO) | Admitting: Legal Medicine

## 2021-03-04 ENCOUNTER — Other Ambulatory Visit: Payer: Self-pay

## 2021-03-04 VITALS — BP 136/80 | HR 64 | Temp 98.4°F | Ht 71.0 in | Wt 205.0 lb

## 2021-03-04 DIAGNOSIS — I2510 Atherosclerotic heart disease of native coronary artery without angina pectoris: Secondary | ICD-10-CM | POA: Diagnosis not present

## 2021-03-04 DIAGNOSIS — R739 Hyperglycemia, unspecified: Secondary | ICD-10-CM | POA: Insufficient documentation

## 2021-03-04 DIAGNOSIS — C61 Malignant neoplasm of prostate: Secondary | ICD-10-CM | POA: Diagnosis not present

## 2021-03-04 DIAGNOSIS — M7552 Bursitis of left shoulder: Secondary | ICD-10-CM

## 2021-03-04 DIAGNOSIS — I10 Essential (primary) hypertension: Secondary | ICD-10-CM

## 2021-03-04 DIAGNOSIS — E782 Mixed hyperlipidemia: Secondary | ICD-10-CM

## 2021-03-04 DIAGNOSIS — D751 Secondary polycythemia: Secondary | ICD-10-CM

## 2021-03-04 NOTE — Progress Notes (Signed)
Established Patient Office Visit  Subjective:  Patient ID: Jacob Collier, male    DOB: [birth year]  Age: 65 y.o. MRN: [id number]  CC:  Chief Complaint  Patient presents with   Shoulder Pain         Back Pain    C/o lower back pain since Monday.    HPI Jacob Collier presents for chronic visit  Patient had CaBG x 4 on 01/19/2021.  He has done well.  Patient presents for follow up of hypertension.  Patient tolerating metoprolol well with side effects.  Patient was diagnosed with hypertension 2010 so has been treated for hypertension for 12 years.Patient is working on maintaining diet and exercise regimen and follows up as directed. Complication include cad.   Hyperglycemia on insulin in hospital  Patient presents with hyperlipidemia.  Compliance with treatment has been good; patient takes medicines as directed, maintains low cholesterol diet, follows up as directed, and maintains exercise regimen.  Patient is using zetia, to start repatha without problems. ,  Prostate cancer s/p radical prostatectomy, off testosterone   Past Medical History:  Diagnosis Date   Arthritis    Coronary artery disease    s/p prior stenting to RCA. Last cath in 05/2019 showed in-stent CTO of RCA and CTO of LCX (medical therpay recommended)   Diverticulosis    Dyspnea    Hard of hearing    Hyperlipidemia    Hypertension    hx of elevation on lyrica, off med and now normal    Paroxysmal SVT (supraventricular tachycardia) ([place])    Prostate cancer ([place])    Wears glasses     Past Surgical History:  Procedure Laterality Date   CARDIAC CATHETERIZATION     with 2 stents    COLECTOMY  05/2018   COLONOSCOPY     CORONARY ARTERY BYPASS GRAFT N/A 01/19/2021   Procedure: CORONARY ARTERY BYPASS GRAFTING (CABG), ON PUMP TIMES FOUR, USING LEFT INTERNAL MAMMARY ARTERY AND ENDOSOCPICALLY HARVESTED RIGHT GREATER SAPHENOUS VEIN;  Surgeon: Lajuana Matte, MD;  Location: [place];  Service: Open Heart  Surgery;  Laterality: N/A;   CYSTOSCOPY WITH URETHRAL DILATATION N/A 01/14/2021   Procedure: CYSTOSCOPY WITH BALLOON  DILATATION;  Surgeon: Bjorn Loser, MD;  Location: [facility];  Service: Urology;  Laterality: N/A;   EYE SURGERY  02/2020   bilaterl cataracts   IR THORACENTESIS ASP PLEURAL SPACE W/IMG GUIDE  02/02/2021   LEFT HEART CATH AND CORONARY ANGIOGRAPHY N/A 12/24/2020   Procedure: LEFT HEART CATH AND CORONARY ANGIOGRAPHY;  Surgeon: Martinique, Peter M, MD;  Location: [facility];  Service: Cardiovascular;  Laterality: N/A;   PROSTATECTOMY     TEE WITHOUT CARDIOVERSION N/A 01/19/2021   Procedure: TRANSESOPHAGEAL ECHOCARDIOGRAM (TEE);  Surgeon: Lajuana Matte, MD;  Location: [facility];  Service: Open Heart Surgery;  Laterality: N/A;   TOTAL HIP ARTHROPLASTY Left 11/10/2015   Procedure: LEFT TOTAL HIP ARTHROPLASTY ANTERIOR APPROACH;  Surgeon: Mcarthur Rossetti, MD;  Location: [place] [place];  Service: Orthopedics;  Laterality: Left;    Family History  Problem Relation Age of Onset   Alzheimer's disease Mother    Stroke Father    Diabetes Sister    Heart attack Maternal Grandmother    Cancer Maternal Uncle        prostate   Prostate cancer Maternal Uncle    Cancer Maternal Uncle        prostate   Prostate cancer Maternal Uncle    Colon polyps Brother  Colon cancer Neg Hx    Esophageal cancer Neg Hx    Rectal cancer Neg Hx    Stomach cancer Neg Hx     Social History   Socioeconomic History   Marital status: Married    Spouse name: Not on file   Number of children: 0   Years of education: Not on file   Highest education level: Not on file  Occupational History   Occupation: Employed at [employer]  Tobacco Use   Smoking status: Never   Smokeless tobacco: Never  Vaping Use   Vaping Use: Never used  Substance and Sexual Activity   Alcohol use: Not Currently    Alcohol/week: 0.0 standard drinks    Comment: rarely   Drug use: No   Sexual activity: Yes     Partners: Female    Birth control/protection: None  Other Topics Concern   Not on file  Social History Narrative   Not on file   Social Determinants of Health   Financial Resource Strain: Not on file  Food Insecurity: Not on file  Transportation Needs: Not on file  Physical Activity: Not on file  Stress: Not on file  Social Connections: Not on file  Intimate Partner Violence: Not on file    Outpatient Medications Prior to Visit  Medication Sig Dispense Refill   acetaminophen (TYLENOL) 500 MG tablet Take 1,000 mg by mouth every 8 (eight) hours as needed for mild pain.     amiodarone (PACERONE) 200 MG tablet Take 1 tablet (200 mg total) by mouth 2 (two) times daily. X 7 days, then decrease to 200 mg daily 60 tablet 1   aspirin EC 81 MG tablet Take 81 mg by mouth daily. Swallow whole.     clonazePAM (KLONOPIN) 1 MG tablet Take 1 tablet (1 mg total) by mouth 2 (two) times daily as needed for anxiety. 60 tablet 3   clopidogrel (PLAVIX) 75 MG tablet Take 75 mg by mouth daily. Stop taking plavix on Saturday Nov. 5, 2022     Eszopiclone 3 MG TABS TAKE 1 TABLET BY MOUTH EVERYDAY AT BEDTIME 30 tablet 2   ezetimibe (ZETIA) 10 MG tablet TAKE 1 TABLET BY MOUTH EVERY DAY (Patient taking differently: Take 10 mg by mouth daily.) 90 tablet 3   metoprolol succinate (TOPROL-XL) 25 MG 24 hr tablet Take 0.5 tablets (12.5 mg total) by mouth daily. Take with or immediately following a meal. 45 tablet 2   nitroGLYCERIN (NITROSTAT) 0.4 MG SL tablet Place 1 tablet (0.4 mg total) under the tongue every 5 (five) minutes as needed for chest pain. 25 tablet 2   Omega-3 Fatty Acids (FISH OIL) 1200 MG CAPS Take 2,400 mg by mouth 2 (two) times daily.     amLODipine (NORVASC) 5 MG tablet TAKE 1 TABLET BY MOUTH EVERY DAY 90 tablet 3   furosemide (LASIX) 40 MG tablet Take 1 tablet (40 mg total) by mouth daily. (Patient not taking: Reported on 02/18/2021) 3 tablet 0   oxybutynin (DITROPAN) 5 MG tablet Take 1 tablet (5 mg  total) by mouth 3 (three) times daily. (Patient not taking: Reported on 02/18/2021) 90 tablet 1   potassium chloride SA (KLOR-CON) 20 MEQ tablet Take 1 tablet (20 mEq total) by mouth daily. (Patient not taking: Reported on 02/18/2021) 3 tablet 0   traMADol (ULTRAM) 50 MG tablet Take 1 tablet (50 mg total) by mouth every 6 (six) hours as needed. (Patient not taking: Reported on 02/18/2021) 28 tablet 0  No facility-administered medications prior to visit.    Allergies  Allergen Reactions   Ranolazine     Chest discomfort/Reflux   Statins Other (See Comments)    myalgias    ROS Review of Systems  Constitutional: Negative.   HENT: Negative.  Negative for congestion.   Eyes: Negative.   Respiratory:  Negative for chest tightness and shortness of breath.   Cardiovascular:  Negative for chest pain, palpitations and leg swelling.  Gastrointestinal:  Negative for abdominal distention and abdominal pain.  Genitourinary: Negative.   Musculoskeletal:  Positive for arthralgias.       Pain sternum from sternotomy  Neurological: Negative.   Psychiatric/Behavioral: Negative.       Objective:    Physical Exam Vitals reviewed.  Constitutional:      General: He is not in acute distress.    Appearance: Normal appearance.  HENT:     Head: Normocephalic.     Right Ear: Tympanic membrane, ear canal and external ear normal.     Left Ear: Tympanic membrane, ear canal and external ear normal.     Mouth/Throat:     Mouth: Mucous membranes are moist.  Eyes:     Extraocular Movements: Extraocular movements intact.     Conjunctiva/sclera: Conjunctivae normal.     Pupils: Pupils are equal, round, and reactive to light.  Cardiovascular:     Rate and Rhythm: Normal rate and regular rhythm.     Pulses: Normal pulses.     Heart sounds: Normal heart sounds. No murmur heard.   No gallop.  Pulmonary:     Effort: Pulmonary effort is normal. No respiratory distress.     Breath sounds: Normal breath  sounds. No wheezing.  Abdominal:     General: Abdomen is flat. Bowel sounds are normal. There is no distension.     Tenderness: There is no abdominal tenderness.  Musculoskeletal:        General: Tenderness present.     Cervical back: Normal range of motion.     Comments: Right shoulder popping, full RoM but pain on abduction  Skin:    General: Skin is warm.     Capillary Refill: Capillary refill takes less than 2 seconds.  Neurological:     General: No focal deficit present.     Mental Status: He is alert and oriented to person, place, and time. Mental status is at baseline.    BP 136/80    Pulse 64    Temp 98.4 F (36.9 C)    Ht 5' 11"  (1.803 m)    Wt 205 lb (93 kg)    SpO2 98%    BMI 28.59 kg/m  Wt Readings from Last 3 Encounters:  03/04/21 205 lb (93 kg)  02/18/21 206 lb (93.4 kg)  01/24/21 214 lb 4.6 oz (97.2 kg)     Health Maintenance Due  Topic Date Due   Pneumonia Vaccine 69+ Years old (1 - PCV) Never done   TETANUS/TDAP  Never done   Zoster Vaccines- Shingrix (1 of 2) Never done   INFLUENZA VACCINE  10/05/2020    There are no preventive care reminders to display for this patient.  No results found for: TSH Lab Results  Component Value Date   WBC 8.6 01/22/2021   HGB 11.5 (L) 01/22/2021   HCT 34.1 (L) 01/22/2021   MCV 89.0 01/22/2021   PLT 125 (L) 01/22/2021   Lab Results  Component Value Date   NA 135 01/24/2021   K 3.7 01/24/2021  CO2 22 01/24/2021   GLUCOSE 114 (H) 01/24/2021   BUN 18 01/24/2021   CREATININE 1.11 01/24/2021   BILITOT 0.9 01/23/2021   ALKPHOS 50 01/23/2021   AST 19 01/23/2021   ALT 18 01/23/2021   PROT 5.8 (L) 01/23/2021   ALBUMIN 2.9 (L) 01/23/2021   CALCIUM 8.0 (L) 01/24/2021   ANIONGAP 7 01/24/2021   EGFR 73 12/09/2020   Lab Results  Component Value Date   CHOL 181 11/16/2020   Lab Results  Component Value Date   HDL 41 11/16/2020   Lab Results  Component Value Date   LDLCALC 114 (H) 11/16/2020   Lab Results   Component Value Date   TRIG 145 11/16/2020   Lab Results  Component Value Date   CHOLHDL 4.4 11/16/2020   Lab Results  Component Value Date   HGBA1C 5.8 (H) 01/15/2021      Assessment & Plan:   Problem List Items Addressed This Visit       Cardiovascular and Mediastinum   CAD (coronary artery disease) - Primary Recently had 4 vessel bypass and doing well    Hypertension   Relevant Orders   CBC with Differential/Platelet   Comprehensive metabolic panel An individual hypertension care plan was established and reinforced today.  The patient's status was assessed using clinical findings on exam and labs or diagnostic tests. The patient's success at meeting treatment goals on disease specific evidence-based guidelines and found to be well controlled. SELF MANAGEMENT: The patient and I together assessed ways to personally work towards obtaining the recommended goals. RECOMMENDATIONS: avoid decongestants found in common cold remedies, decrease consumption of alcohol, perform routine monitoring of BP with home BP cuff, exercise, reduction of dietary salt, take medicines as prescribed, try not to miss doses and quit smoking.  Regular exercise and maintaining a healthy weight is needed.  Stress reduction may help. A CLINICAL SUMMARY including written plan identify barriers to care unique to individual due to social or financial issues.  We attempt to mutually creat solutions for individual and family understanding.      Musculoskeletal and Integument   Bursitis of left shoulder Inject left shoulder, no complications  After consent was obtained, using sterile technique the left shoulder was prepped and Ethyl Chloride was used as local anesthetic. The joint was entered and  Steroid 80 mg and 3 ml plain Lidocaine was then injected and the needle withdrawn.  The procedure was well tolerated.  The patient is asked to continue to rest the joint for a few more days before resuming regular  activities.  It may be more painful for the first 1-2 days.  Watch for fever, or increased swelling or persistent pain in the joint. Call or return to clinic prn if such symptoms occur or there is failure to improve as anticipated.      Genitourinary   Prostate cancer ([place])   Relevant Orders   PSA No symptoms, patient is off his testosterone     Other   Polycythemia From testosterone, now off so recheck    Hyperglycemia   Relevant Orders   Hemoglobin A1c Patient has hyperglycemia in the hospital requiring insulin coverage   Other Visit Diagnoses     Mixed hyperlipidemia       Relevant Orders   Lipid panel AN INDIVIDUAL CARE PLAN for hyperlipidemia/ cholesterol was established and reinforced today.  The patient's status was assessed using clinical findings on exam, lab and other diagnostic tests. The patient's disease status was assessed based  on evidence-based guidelines and found to be fair controlled. To start repatha MEDICATIONS were reviewed. SELF MANAGEMENT GOALS have been discussed and patient's success at attaining the goal of low cholesterol was assessed. RECOMMENDATION given include regular exercise 3 days a week and low cholesterol/low fat diet. CLINICAL SUMMARY including written plan to identify barriers unique to the patient due to social or economic  reasons was discussed.      30 plus minute visit with review of hospital and cardiology records    Follow-up: Return in about 6 months (around 09/02/2021).    Reinaldo Meeker, MD

## 2021-03-05 LAB — COMPREHENSIVE METABOLIC PANEL
ALT: 37 IU/L (ref 0–44)
AST: 20 IU/L (ref 0–40)
Albumin/Globulin Ratio: 1.5 (ref 1.2–2.2)
Albumin: 4.2 g/dL (ref 3.8–4.8)
Alkaline Phosphatase: 131 IU/L — ABNORMAL HIGH (ref 44–121)
BUN/Creatinine Ratio: 18 (ref 10–24)
BUN: 19 mg/dL (ref 8–27)
Bilirubin Total: 0.3 mg/dL (ref 0.0–1.2)
CO2: 20 mmol/L (ref 20–29)
Calcium: 9.7 mg/dL (ref 8.6–10.2)
Chloride: 104 mmol/L (ref 96–106)
Creatinine, Ser: 1.06 mg/dL (ref 0.76–1.27)
Globulin, Total: 2.8 g/dL (ref 1.5–4.5)
Glucose: 89 mg/dL (ref 70–99)
Potassium: 4.7 mmol/L (ref 3.5–5.2)
Sodium: 141 mmol/L (ref 134–144)
Total Protein: 7 g/dL (ref 6.0–8.5)
eGFR: 77 mL/min/{1.73_m2} (ref >59)

## 2021-03-05 LAB — HEMOGLOBIN A1C
Est. average glucose Bld gHb Est-mCnc: 120 mg/dL
Hgb A1c MFr Bld: 5.8 % — ABNORMAL HIGH (ref 4.8–5.6)

## 2021-03-05 LAB — CBC WITH DIFFERENTIAL/PLATELET
Basophils Absolute: 0.1 10*3/uL (ref 0.0–0.2)
Basos: 1 %
EOS (ABSOLUTE): 0.4 10*3/uL (ref 0.0–0.4)
Eos: 6 %
Hematocrit: 48.7 % (ref 37.5–51.0)
Hemoglobin: 15.8 g/dL (ref 13.0–17.7)
Immature Grans (Abs): 0 10*3/uL (ref 0.0–0.1)
Immature Granulocytes: 0 %
Lymphocytes Absolute: 2.2 10*3/uL (ref 0.7–3.1)
Lymphs: 31 %
MCH: 28.9 pg (ref 26.6–33.0)
MCHC: 32.4 g/dL (ref 31.5–35.7)
MCV: 89 fL (ref 79–97)
Monocytes Absolute: 0.5 10*3/uL (ref 0.1–0.9)
Monocytes: 7 %
Neutrophils Absolute: 3.8 10*3/uL (ref 1.4–7.0)
Neutrophils: 55 %
Platelets: 216 10*3/uL (ref 150–450)
RBC: 5.47 x10E6/uL (ref 4.14–5.80)
RDW: 13.2 % (ref 11.6–15.4)
WBC: 7 10*3/uL (ref 3.4–10.8)

## 2021-03-05 LAB — LIPID PANEL
Chol/HDL Ratio: 6.1 ratio — ABNORMAL HIGH (ref 0.0–5.0)
Cholesterol, Total: 209 mg/dL — ABNORMAL HIGH (ref 100–199)
HDL: 34 mg/dL — ABNORMAL LOW (ref >39)
LDL Chol Calc (NIH): 128 mg/dL — ABNORMAL HIGH (ref 0–99)
Triglycerides: 263 mg/dL — ABNORMAL HIGH (ref 0–149)
VLDL Cholesterol Cal: 47 mg/dL — ABNORMAL HIGH (ref 5–40)

## 2021-03-05 LAB — PSA: Prostate Specific Ag, Serum: <0.1 ng/mL (ref 0.0–4.0)

## 2021-03-05 LAB — CARDIOVASCULAR RISK ASSESSMENT

## 2021-03-06 NOTE — Progress Notes (Signed)
rBC low some anemia probably from surgery, kidney and liver tests normal, A1c 5.8 prediabetes- needs diabetic or DASH diet, triglycerides high, LDL cholesterol high need to start Repatha to lower cardiac risk, PSA <0.1,  lp

## 2021-03-09 ENCOUNTER — Ambulatory Visit
Admission: RE | Admit: 2021-03-09 | Discharge: 2021-03-09 | Disposition: A | Discharge status: Home / Self Care | Payer: Managed Care, Other (non HMO) | Source: Ambulatory Visit | Attending: Thoracic Surgery (Cardiothoracic Vascular Surgery) | Admitting: Thoracic Surgery (Cardiothoracic Vascular Surgery)

## 2021-03-09 ENCOUNTER — Ambulatory Visit (INDEPENDENT_AMBULATORY_CARE_PROVIDER_SITE_OTHER): Payer: Self-pay | Admitting: Physician Assistant

## 2021-03-09 ENCOUNTER — Other Ambulatory Visit: Payer: Self-pay

## 2021-03-09 ENCOUNTER — Encounter: Payer: Self-pay | Admitting: Physician Assistant

## 2021-03-09 VITALS — BP 160/95 | HR 60 | Resp 20 | Ht 71.0 in | Wt 205.0 lb

## 2021-03-09 DIAGNOSIS — Z951 Presence of aortocoronary bypass graft: Secondary | ICD-10-CM

## 2021-03-09 DIAGNOSIS — R058 Other specified cough: Secondary | ICD-10-CM

## 2021-03-09 DIAGNOSIS — I10 Essential (primary) hypertension: Secondary | ICD-10-CM

## 2021-03-09 DIAGNOSIS — R059 Cough, unspecified: Secondary | ICD-10-CM | POA: Diagnosis not present

## 2021-03-09 MED ORDER — AMLODIPINE BESYLATE 5 MG PO TABS: 5.0000 mg | ORAL_TABLET | Freq: Every day | ORAL | 2 refills | Status: DC

## 2021-03-09 NOTE — Progress Notes (Signed)
[address]       [place] [address]             [phone]       HPI: Jacob Collier is a 66 year old male with a history of hypertension, dyslipidemia, statin intolerance and coronary artery disease. Patient returns for routine postoperative follow-up having undergone CABG x4 by Dr. [name] on 01/20/2019. The patient's early postoperative recovery while in the hospital was notable for atrial fibrillation with RVR managed with amiodarone.  He subsequently converted back to sinus rhythm.  He has been seen by both cardiology and his PCP within the past few weeks and reported no palpitations and was felt to be in a regular rhythm during those visits.  Since hospital discharge he has undergone a left thoracentesis on 02/02/2021 yielding 2 L of serous fluid.  He returns today for routine scheduled follow-up with chest x-ray.  He says he continues to make progress.  He denies any further chest pain or shortness of breath.  He has begun participating in the cardiac rehab program in [place]. He said he would like to return to work for light duty in 1 week.    Current Outpatient Medications  Medication Sig Dispense Refill   acetaminophen (TYLENOL) 500 MG tablet Take 1,000 mg by mouth every 8 (eight) hours as needed for mild pain.     amiodarone (PACERONE) 200 MG tablet Take 1 tablet (200 mg total) by mouth 2 (two) times daily. X 7 days, then decrease to 200 mg daily 60 tablet 1   aspirin EC 81 MG tablet Take 81 mg by mouth daily. Swallow whole.     clonazePAM (KLONOPIN) 1 MG tablet Take 1 tablet (1 mg total) by mouth 2 (two) times daily as needed for anxiety. 60 tablet 3   clopidogrel (PLAVIX) 75 MG tablet Take 75 mg by mouth daily. Stop taking plavix on Saturday Nov. 5, 2022     Eszopiclone 3 MG TABS TAKE 1 TABLET BY MOUTH EVERYDAY AT BEDTIME 30 tablet 2   ezetimibe (ZETIA) 10 MG tablet TAKE 1 TABLET BY MOUTH EVERY DAY (Patient taking differently: Take 10 mg by mouth  daily.) 90 tablet 3   metoprolol succinate (TOPROL-XL) 25 MG 24 hr tablet Take 0.5 tablets (12.5 mg total) by mouth daily. Take with or immediately following a meal. 45 tablet 2   nitroGLYCERIN (NITROSTAT) 0.4 MG SL tablet Place 1 tablet (0.4 mg total) under the tongue every 5 (five) minutes as needed for chest pain. 25 tablet 2   Omega-3 Fatty Acids (FISH OIL) 1200 MG CAPS Take 2,400 mg by mouth 2 (two) times daily.     No current facility-administered medications for this visit.    Physical Exam  Vital signs: BP 160/95 Pulse 60 Respiration 20 SPO2 96% on room air  Heart: Regular rate and rhythm Chest: Breath sounds are clear to auscultation.  Sternotomy incision is well-healed.  Sternum is stable.  He continues to have some mild parasternal tenderness. Extremities: All warm and well perfused, the right lower extremity EVH incision is well-healed.  There is no peripheral edema.   Diagnostic Tests:  CLINICAL DATA:  Cough, status post CABG.   EXAM: CHEST - 2 VIEW   COMPARISON:  Chest radiograph dated February 02, 2021   FINDINGS: The heart size and mediastinal contours are within normal limits. Small left pleural effusion. Lungs otherwise clear. The visualized skeletal structures are unremarkable.   IMPRESSION: 1. Small left pleural effusion.  2. No acute cardiopulmonary process.     Electronically Signed   By: Keane Police D.O.   On: 03/09/2021 13:18     Impression / Plan: -CAD-[name] is now 6 weeks post coronary bypass grafting for three-vessel coronary artery disease.  He is progressing well from surgical standpoint.  Chest x-ray today is near complete resolution of the left pleural effusion.  He has had no apparent recurrence of atrial fibrillation.  Agree that he may discontinue the amiodarone in about 2 weeks if he has no evidence of recurrent atrial fibrillation. He would like to return to work in about 1 week on light duty but wanted to discuss this with his  employer before a letter was issued.  -Hypertension-blood pressure 160/95 today and he reports similar readings over the past several days.  He is already increased his metoprolol succinate to 25 mg once daily.  Suggested he go back on his amlodipine 5 mg daily and this will likely need to be uptitrated in the coming weeks as he advances his activity level.  -Dyslipidemia-currently on Zetia but no statin due to previous intolerance.  -Follow-up as needed.  Continue to observe sternal precautions for another 6 weeks.     Antony Odea, PA-C [facility] [phone]

## 2021-03-09 NOTE — Patient Instructions (Addendum)
Continue to observe sternal precautions for another 6 weeks  May return to work in 1 week for light duty which should continue for 1 month.  Then may resume work activities without restrictions.  Begin taking Norvasc 5 mg p.o. daily for blood pressure

## 2021-03-10 ENCOUNTER — Telehealth: Payer: Self-pay | Admitting: Cardiovascular Disease

## 2021-03-10 DIAGNOSIS — Z951 Presence of aortocoronary bypass graft: Secondary | ICD-10-CM | POA: Diagnosis not present

## 2021-03-10 NOTE — Telephone Encounter (Signed)
Patient calling in to see if we got his disability paperwork from his job. Please advise

## 2021-03-12 ENCOUNTER — Other Ambulatory Visit: Payer: Managed Care, Other (non HMO)

## 2021-03-12 ENCOUNTER — Ambulatory Visit: Payer: Managed Care, Other (non HMO) | Admitting: Hematology and Oncology

## 2021-03-17 ENCOUNTER — Other Ambulatory Visit: Payer: Self-pay | Admitting: Hematology and Oncology

## 2021-03-17 ENCOUNTER — Telehealth: Payer: Self-pay

## 2021-03-17 ENCOUNTER — Inpatient Hospital Stay: Payer: Managed Care, Other (non HMO) | Attending: Hematology and Oncology

## 2021-03-17 ENCOUNTER — Inpatient Hospital Stay (HOSPITAL_BASED_OUTPATIENT_CLINIC_OR_DEPARTMENT_OTHER): Payer: Managed Care, Other (non HMO) | Admitting: Hematology and Oncology

## 2021-03-17 ENCOUNTER — Other Ambulatory Visit: Payer: Self-pay

## 2021-03-17 VITALS — BP 125/73 | HR 66 | Temp 97.3°F | Resp 18 | Wt 207.4 lb

## 2021-03-17 DIAGNOSIS — Z79899 Other long term (current) drug therapy: Secondary | ICD-10-CM | POA: Diagnosis not present

## 2021-03-17 DIAGNOSIS — D751 Secondary polycythemia: Secondary | ICD-10-CM | POA: Insufficient documentation

## 2021-03-17 DIAGNOSIS — C61 Malignant neoplasm of prostate: Secondary | ICD-10-CM | POA: Insufficient documentation

## 2021-03-17 LAB — CMP (CANCER CENTER ONLY)
ALT: 30 U/L (ref 0–44)
AST: 17 U/L (ref 15–41)
Albumin: 3.8 g/dL (ref 3.5–5.0)
Alkaline Phosphatase: 63 U/L (ref 38–126)
Anion gap: 8 (ref 5–15)
BUN: 24 mg/dL — ABNORMAL HIGH (ref 8–23)
CO2: 21 mmol/L — ABNORMAL LOW (ref 22–32)
Calcium: 8.7 mg/dL — ABNORMAL LOW (ref 8.9–10.3)
Chloride: 108 mmol/L (ref 98–111)
Creatinine: 1.17 mg/dL (ref 0.61–1.24)
GFR, Estimated: >60 mL/min (ref >60)
Glucose, Bld: 142 mg/dL — ABNORMAL HIGH (ref 70–99)
Potassium: 3.8 mmol/L (ref 3.5–5.1)
Sodium: 137 mmol/L (ref 135–145)
Total Bilirubin: 0.5 mg/dL (ref 0.3–1.2)
Total Protein: 6.7 g/dL (ref 6.5–8.1)

## 2021-03-17 LAB — CBC WITH DIFFERENTIAL (CANCER CENTER ONLY)
Abs Immature Granulocytes: 0.07 10*3/uL (ref 0.00–0.07)
Basophils Absolute: 0 10*3/uL (ref 0.0–0.1)
Basophils Relative: 0 %
Eosinophils Absolute: 0.1 10*3/uL (ref 0.0–0.5)
Eosinophils Relative: 1 %
HCT: 44.5 % (ref 39.0–52.0)
Hemoglobin: 14.6 g/dL (ref 13.0–17.0)
Immature Granulocytes: 1 %
Lymphocytes Relative: 13 %
Lymphs Abs: 1.2 10*3/uL (ref 0.7–4.0)
MCH: 29.1 pg (ref 26.0–34.0)
MCHC: 32.8 g/dL (ref 30.0–36.0)
MCV: 88.8 fL (ref 80.0–100.0)
Monocytes Absolute: 0.3 10*3/uL (ref 0.1–1.0)
Monocytes Relative: 3 %
Neutro Abs: 7.6 10*3/uL (ref 1.7–7.7)
Neutrophils Relative %: 82 %
Platelet Count: 170 10*3/uL (ref 150–400)
RBC: 5.01 MIL/uL (ref 4.22–5.81)
RDW: 15.3 % (ref 11.5–15.5)
WBC Count: 9.3 10*3/uL (ref 4.0–10.5)
nRBC: 0 % (ref 0.0–0.2)

## 2021-03-17 NOTE — Progress Notes (Signed)
[facility] [phone]   [phone]  PROGRESS NOTE  Patient Care Team: Jacob Anes, MD as PCP - General (Family Medicine) Jacob Sine, MD as PCP - Cardiology (Cardiology) Jacob Mallow, MD as Consulting Physician (Urology)  Hematological/Oncological History # Polycythemia, secondary  06/11/2019: WBC 6.9, Hgb 15.5, MCV 91.4, Plt 219 04/01/2020: WBC 8.6, Hgb 18.4, MCV 87, Plt 219 08/03/2020: WBC 8.8, Hgb 19.3, MCV 88.7, Plt 187 09/09/2020: establish care with Dr. Lorenso Collier. Hgb 18.2  03/17/2021: WBC 9.3, Hgb 14.6, MCV 88.8, Plt 170   Interval History:  Jacob Collier 66 y.o. male with medical history significant for secondary polycythemia who presents for a follow up visit. The patient's last visit was on 09/09/2020.. In the interim since the last visit underwent a CABG procedure on 01/19/2021.  On exam today Jacob Collier is accompanied by his wife.  He reports that the CABG procedure went well.  He is healed up well and his postoperative course has been unremarkable.  He still has some difficulty with shortness of breath but reports compared to his prior symptoms "all take it".  His blood pressure has been stabilized and overall he has felt well.  He recently restarted his testosterone shots after having stopped after his surgery.  He notes that he is also lost a considerable amount of weight since the time of the surgery down to 207 pounds from a maximum of 230 pounds.  He reports that he is not having any more apnea-like symptoms and his wife also notes he has been sleeping much better since the procedure.  He currently denies any fevers, chills, sweats, nausea, vomiting or diarrhea.  A full 10 point ROS is listed below.  MEDICAL HISTORY:  Past Medical History:  Diagnosis Date   Arthritis    Coronary artery disease    s/p prior stenting to RCA. Last cath in 05/2019 showed in-stent CTO of RCA and CTO of LCX (medical therpay recommended)    Diverticulosis    Dyspnea    Hard of hearing    Hyperlipidemia    Hypertension    hx of elevation on lyrica, off med and now normal    Paroxysmal SVT (supraventricular tachycardia) ([facility])    Prostate cancer ([facility])    Wears glasses     SURGICAL HISTORY: Past Surgical History:  Procedure Laterality Date   CARDIAC CATHETERIZATION     with 2 stents    COLECTOMY  05/2018   COLONOSCOPY     CORONARY ARTERY BYPASS GRAFT N/A 01/19/2021   Procedure: CORONARY ARTERY BYPASS GRAFTING (CABG), ON PUMP TIMES FOUR, USING LEFT INTERNAL MAMMARY ARTERY AND ENDOSOCPICALLY HARVESTED RIGHT GREATER SAPHENOUS VEIN;  Surgeon: Lajuana Matte, MD;  Location: [facility];  Service: Open Heart Surgery;  Laterality: N/A;   CYSTOSCOPY WITH URETHRAL DILATATION N/A 01/14/2021   Procedure: CYSTOSCOPY WITH BALLOON  DILATATION;  Surgeon: Bjorn Loser, MD;  Location: [facility];  Service: Urology;  Laterality: N/A;   EYE SURGERY  02/2020   bilaterl cataracts   IR THORACENTESIS ASP PLEURAL SPACE W/IMG GUIDE  02/02/2021   LEFT HEART CATH AND CORONARY ANGIOGRAPHY N/A 12/24/2020   Procedure: LEFT HEART CATH AND CORONARY ANGIOGRAPHY;  Surgeon: Martinique, Peter M, MD;  Location: [facility];  Service: Cardiovascular;  Laterality: N/A;   PROSTATECTOMY     TEE WITHOUT CARDIOVERSION N/A 01/19/2021   Procedure: TRANSESOPHAGEAL ECHOCARDIOGRAM (TEE);  Surgeon: Lajuana Matte, MD;  Location: [facility];  Service: Open Heart  Surgery;  Laterality: N/A;   TOTAL HIP ARTHROPLASTY Left 11/10/2015   Procedure: LEFT TOTAL HIP ARTHROPLASTY ANTERIOR APPROACH;  Surgeon: Mcarthur Rossetti, MD;  Location: [facility];  Service: Orthopedics;  Laterality: Left;    SOCIAL HISTORY: Social History   Socioeconomic History   Marital status: Married    Spouse name: Not on file   Number of children: 0   Years of education: Not on file   Highest education level: Not on file  Occupational History   Occupation: Employed at [employer]   Tobacco Use   Smoking status: Never   Smokeless tobacco: Never  Vaping Use   Vaping Use: Never used  Substance and Sexual Activity   Alcohol use: Not Currently    Alcohol/week: 0.0 standard drinks    Comment: rarely   Drug use: No   Sexual activity: Yes    Partners: Female    Birth control/protection: None  Other Topics Concern   Not on file  Social History Narrative   Not on file   Social Determinants of Health   Financial Resource Strain: Not on file  Food Insecurity: Not on file  Transportation Needs: Not on file  Physical Activity: Not on file  Stress: Not on file  Social Connections: Not on file  Intimate Partner Violence: Not on file    FAMILY HISTORY: Family History  Problem Relation Age of Onset   Alzheimer's disease Mother    Stroke Father    Diabetes Sister    Heart attack Maternal Grandmother    Cancer Maternal Uncle        prostate   Prostate cancer Maternal Uncle    Cancer Maternal Uncle        prostate   Prostate cancer Maternal Uncle    Colon polyps Brother    Colon cancer Neg Hx    Esophageal cancer Neg Hx    Rectal cancer Neg Hx    Stomach cancer Neg Hx     ALLERGIES:  is allergic to ranolazine and statins.  MEDICATIONS:  Current Outpatient Medications  Medication Sig Dispense Refill   acetaminophen (TYLENOL) 500 MG tablet Take 1,000 mg by mouth every 8 (eight) hours as needed for mild pain.     amiodarone (PACERONE) 200 MG tablet Take 1 tablet (200 mg total) by mouth 2 (two) times daily. X 7 days, then decrease to 200 mg daily 60 tablet 1   amLODipine (NORVASC) 5 MG tablet Take 1 tablet (5 mg total) by mouth daily. 30 tablet 2   aspirin EC 81 MG tablet Take 81 mg by mouth daily. Swallow whole.     clonazePAM (KLONOPIN) 1 MG tablet Take 1 tablet (1 mg total) by mouth 2 (two) times daily as needed for anxiety. 60 tablet 3   clopidogrel (PLAVIX) 75 MG tablet Take 75 mg by mouth daily. Stop taking plavix on Saturday Nov. 5, 2022      Eszopiclone 3 MG TABS TAKE 1 TABLET BY MOUTH EVERYDAY AT BEDTIME 30 tablet 2   ezetimibe (ZETIA) 10 MG tablet TAKE 1 TABLET BY MOUTH EVERY DAY (Patient taking differently: Take 10 mg by mouth daily.) 90 tablet 3   metoprolol succinate (TOPROL-XL) 25 MG 24 hr tablet Take 0.5 tablets (12.5 mg total) by mouth daily. Take with or immediately following a meal. (Patient taking differently: Take 25 mg by mouth daily. Take with or immediately following a meal.) 45 tablet 2   nitroGLYCERIN (NITROSTAT) 0.4 MG SL tablet Place 1 tablet (0.4 mg  total) under the tongue every 5 (five) minutes as needed for chest pain. 25 tablet 2   Omega-3 Fatty Acids (FISH OIL) 1200 MG CAPS Take 2,400 mg by mouth 2 (two) times daily.     No current facility-administered medications for this visit.    REVIEW OF SYSTEMS:   Constitutional: ( - ) fevers, ( - )  chills , ( - ) night sweats Eyes: ( - ) blurriness of vision, ( - ) double vision, ( - ) watery eyes Ears, nose, mouth, throat, and face: ( - ) mucositis, ( - ) sore throat Respiratory: ( - ) cough, ( - ) dyspnea, ( - ) wheezes Cardiovascular: ( - ) palpitation, ( - ) chest discomfort, ( - ) lower extremity swelling Gastrointestinal:  ( - ) nausea, ( - ) heartburn, ( - ) change in bowel habits Skin: ( - ) abnormal skin rashes Lymphatics: ( - ) new lymphadenopathy, ( - ) easy bruising Neurological: ( - ) numbness, ( - ) tingling, ( - ) new weaknesses Behavioral/Psych: ( - ) mood change, ( - ) new changes  All other systems were reviewed with the patient and are negative.  PHYSICAL EXAMINATION:  Vitals:   03/17/21 1430  BP: 125/73  Pulse: 66  Resp: 18  Temp: (!) 97.3 F (36.3 C)  SpO2: 95%   Filed Weights   03/17/21 1430  Weight: 207 lb 6 oz (94.1 kg)    GENERAL: Well-appearing elderly Caucasian male, alert, no distress and comfortable SKIN: skin color, texture, turgor are normal, no rashes or significant lesions EYES: conjunctiva are pink and  non-injected, sclera clear LUNGS: clear to auscultation and percussion with normal breathing effort HEART: regular rate & rhythm and no murmurs and no lower extremity edema Musculoskeletal: no cyanosis of digits and no clubbing  PSYCH: alert & oriented x 3, fluent speech NEURO: no focal motor/sensory deficits  LABORATORY DATA:  I have reviewed the data as listed CBC Latest Ref Rng & Units 03/17/2021 03/04/2021 01/22/2021  WBC 4.0 - 10.5 K/uL 9.3 7.0 8.6  Hemoglobin 13.0 - 17.0 g/dL 14.6 15.8 11.5(L)  Hematocrit 39.0 - 52.0 % 44.5 48.7 34.1(L)  Platelets 150 - 400 K/uL 170 216 125(L)    CMP Latest Ref Rng & Units 03/17/2021 03/04/2021 01/24/2021  Glucose 70 - 99 mg/dL 142(H) 89 114(H)  BUN 8 - 23 mg/dL 24(H) 19 18  Creatinine 0.61 - 1.24 mg/dL 1.17 1.06 1.11  Sodium 135 - 145 mmol/L 137 141 135  Potassium 3.5 - 5.1 mmol/L 3.8 4.7 3.7  Chloride 98 - 111 mmol/L 108 104 106  CO2 22 - 32 mmol/L 21(L) 20 22  Calcium 8.9 - 10.3 mg/dL 8.7(L) 9.7 8.0(L)  Total Protein 6.5 - 8.1 g/dL 6.7 7.0 -  Total Bilirubin 0.3 - 1.2 mg/dL 0.5 0.3 -  Alkaline Phos 38 - 126 U/L 63 131(H) -  AST 15 - 41 U/L 17 20 -  ALT 0 - 44 U/L 30 37 -    RADIOGRAPHIC STUDIES: DG Chest 2 View  Result Date: 03/09/2021 CLINICAL DATA:  Cough, status post CABG. EXAM: CHEST - 2 VIEW COMPARISON:  Chest radiograph dated February 02, 2021 FINDINGS: The heart size and mediastinal contours are within normal limits. Small left pleural effusion. Lungs otherwise clear. The visualized skeletal structures are unremarkable. IMPRESSION: 1. Small left pleural effusion. 2. No acute cardiopulmonary process. Electronically Signed   By: Keane Police D.O.   On: 03/09/2021 13:18    ASSESSMENT &  PLAN TYJAI MATUSZAK 66 y.o. male with medical history significant for secondary polycythemia who presents for a follow up visit.   After review of the labs, review of the records, and discussion with the patient the patients findings are most  consistent with a secondary polycythemia.  The patient has 2 possible sources including obstructive sleep apnea or exogenous testosterone use.  Polysomnography was ordered but not performed in the interim since her last visit.  He also had a CABG procedure after which time his apnea symptoms resolved, possibly due to marked weight loss.  At this time his hemoglobin has normalized and the patient has restarted his testosterone shots.  I have once again expressed to him my recommendation the testosterone shots are not advisable in the setting of polycythemia and his prior history of prostate cancer.  He notes that he will consider polysomnography testing if his hemoglobin were to increase at the next visit.  # Polycythemia, Likely Secondary -- Findings at this time are most consistent with a secondary polycythemia due to either obstructive sleep apnea or exogenous testosterone injections. --Hgb today normalized at 14.6, having dropped from after his CABG surgery in Nov 2022.  -- Previously ordered polysomnography was not performed.  We will order this testing if the patient were to have another increase in polycythemia or recurrent OSA symptoms. -- Today we will repeat CBC, CMP --negative JAK2 with reflex and BCR/ABL FISH panels at last visit.  --No indication for a bone marrow biopsy at this time. -- Return to clinic in 6 months time    #Prostate Cancer --s/p prostatectomy in 2015, biochemical recurrence treated in 2018 with radiation therapy --currently follows with [facility] -- Discussed with patient that testosterone shots in the setting of history of prostate cancer is not advisable. -- Defer management to urology, but patient wanted [place] to continue to monitoring for this disease would be happy to oblige.  No orders of the defined types were placed in this encounter.   All questions were answered. The patient knows to call the clinic with any problems, questions or concerns.  A total  of more than 30 minutes were spent on this encounter with face-to-face time and non-face-to-face time, including preparing to see the patient, ordering tests and/or medications, counseling the patient and coordination of care as outlined above.   Ledell Peoples, MD Department of Hematology/Oncology [place] at [facility] Phone: [phone] Pager: [phone] Email: [email or web address]  03/17/2021 3:49 PM

## 2021-03-17 NOTE — Telephone Encounter (Signed)
Located STD forms in patient's media tab.in epic chart. They were scan into his chart by medical records in the hospital not completed. And did not notify Dr. Abran Duke office. Of forms location. The STD form was completed today and faxed to [facility] at [phone]. LOA 01/19/21 through 04/19/21. RTW noted included in fax return to work date 04/19/21 full time with no restrictions.

## 2021-03-18 ENCOUNTER — Telehealth: Payer: Self-pay | Admitting: Hematology and Oncology

## 2021-03-18 ENCOUNTER — Ambulatory Visit: Payer: Managed Care, Other (non HMO) | Admitting: Hematology and Oncology

## 2021-03-18 ENCOUNTER — Other Ambulatory Visit: Payer: Managed Care, Other (non HMO)

## 2021-03-18 NOTE — Telephone Encounter (Signed)
Scheduled per 1/11 los, calender has been mailed

## 2021-03-29 ENCOUNTER — Ambulatory Visit: Payer: Managed Care, Other (non HMO) | Admitting: Physician Assistant

## 2021-03-31 ENCOUNTER — Telehealth: Payer: Self-pay | Admitting: Pharmacist

## 2021-03-31 ENCOUNTER — Other Ambulatory Visit: Payer: Self-pay

## 2021-03-31 ENCOUNTER — Ambulatory Visit (INDEPENDENT_AMBULATORY_CARE_PROVIDER_SITE_OTHER): Payer: Managed Care, Other (non HMO) | Admitting: Pharmacist

## 2021-03-31 VITALS — BP 152/83 | HR 63 | Resp 16 | Ht 71.0 in | Wt 217.4 lb

## 2021-03-31 DIAGNOSIS — T466X5A Adverse effect of antihyperlipidemic and antiarteriosclerotic drugs, initial encounter: Secondary | ICD-10-CM

## 2021-03-31 DIAGNOSIS — M791 Myalgia, unspecified site: Secondary | ICD-10-CM | POA: Diagnosis not present

## 2021-03-31 DIAGNOSIS — E785 Hyperlipidemia, unspecified: Secondary | ICD-10-CM | POA: Diagnosis not present

## 2021-03-31 DIAGNOSIS — Z951 Presence of aortocoronary bypass graft: Secondary | ICD-10-CM

## 2021-03-31 DIAGNOSIS — I2510 Atherosclerotic heart disease of native coronary artery without angina pectoris: Secondary | ICD-10-CM | POA: Diagnosis not present

## 2021-03-31 MED ORDER — REPATHA SURECLICK 140 MG/ML ~~LOC~~ SOAJ: 140.0000 mg | SUBCUTANEOUS | 11 refills | Status: DC

## 2021-03-31 NOTE — Telephone Encounter (Signed)
Called and the following information was provided to the pt:  Pa submitted for repatha: Massie Miskell (Key: [id number])  Repatha approved until 03/31/22  [facility]  Lipid panel ordered and released. Instructed the pt that these labs are due post 4th dose.  We just need a little more information from you. Call RepathaReady at [phone] to see if you qualify for the [place]. I encouraged the pt to call them to obtain copay card.  Pt voiced understanding.

## 2021-03-31 NOTE — Telephone Encounter (Signed)
Please complete prior authorization for:  Name of medication, dose, and frequency: repatha 140mg  sq q 14 days or praluent 75mg  sq q 14 days  Lab Orders Requested? yes  Which labs? Lipid panel  Estimated date for labs to be scheduled 2-3 months  Does patient need activated copay card? yes

## 2021-03-31 NOTE — Patient Instructions (Signed)
It was nice meeting you today  We would like your LDL to be less than 55  Continue your Zetia 10mg  once a day  We will start a new medication called Repatha which you will inject once every 2 weeks  We will complete the prior authorization for you and contact you when it is complete  Once you start the medication we will recheck your cholesterol in 2-3 months  Please call with any questions!  Karren Cobble, PharmD, BCACP, [place], [place], [place], [place], [address] Phone: [phone], Fax: [phone]

## 2021-03-31 NOTE — Progress Notes (Signed)
Patient ID: Jacob Collier                 DOB: 1955/10/22                    MRN: [id number]     HPI: Jacob Collier is a 66 y.o. male patient referred to lipid clinic by Dr [name]. PMH is significant for CAD, angina, HTN and s/p CABG x4.  Patient is statin intolerant.  Patient presents today with wife.  Rosuvastatin, atorvastatin, and pitavastatin all called muscle pain.  Patient gives himself exogenous testosterone and reports he has always felt better aftwards.    Cardiac cath on 12/24/20 showed Prox LAD 75% stenosed, Prox Cx to Mid Cx lesion is 100% stenosed, and Prox RCA to Mid RCA lesion is 100% stenosed.   Patient is motivated to reduce LDL and prevent further cardiac events. Remains on Zetia 10mg .   Current Medications: Zetia 10mg  daily  Intolerances:  Pravastatin Pitavastatin Rosuvastatin Atorvastatin  Risk Factors:  CAD Angina HTN Hx of CABG  LDL goal: <55  Labs: TC 209, Trigs 263, HDL 34, LDL 128 (03/04/21 - not any lipid lowering medications)  Past Medical History:  Diagnosis Date   Arthritis    Coronary artery disease    s/p prior stenting to RCA. Last cath in 05/2019 showed in-stent CTO of RCA and CTO of LCX (medical therpay recommended)   Diverticulosis    Dyspnea    Hard of hearing    Hyperlipidemia    Hypertension    hx of elevation on lyrica, off med and now normal    Paroxysmal SVT (supraventricular tachycardia) ([facility])    Prostate cancer ([place])    Wears glasses     Current Outpatient Medications on File Prior to Visit  Medication Sig Dispense Refill   acetaminophen (TYLENOL) 500 MG tablet Take 1,000 mg by mouth every 8 (eight) hours as needed for mild pain.     amiodarone (PACERONE) 200 MG tablet Take 1 tablet (200 mg total) by mouth 2 (two) times daily. X 7 days, then decrease to 200 mg daily 60 tablet 1   amLODipine (NORVASC) 5 MG tablet Take 1 tablet (5 mg total) by mouth daily. 30 tablet 2   aspirin EC 81 MG tablet Take 81 mg by mouth daily.  Swallow whole.     clonazePAM (KLONOPIN) 1 MG tablet Take 1 tablet (1 mg total) by mouth 2 (two) times daily as needed for anxiety. 60 tablet 3   clopidogrel (PLAVIX) 75 MG tablet Take 75 mg by mouth daily. Stop taking plavix on Saturday Nov. 5, 2022     Eszopiclone 3 MG TABS TAKE 1 TABLET BY MOUTH EVERYDAY AT BEDTIME 30 tablet 2   ezetimibe (ZETIA) 10 MG tablet TAKE 1 TABLET BY MOUTH EVERY DAY (Patient taking differently: Take 10 mg by mouth daily.) 90 tablet 3   metoprolol succinate (TOPROL-XL) 25 MG 24 hr tablet Take 0.5 tablets (12.5 mg total) by mouth daily. Take with or immediately following a meal. (Patient taking differently: Take 25 mg by mouth daily. Take with or immediately following a meal.) 45 tablet 2   nitroGLYCERIN (NITROSTAT) 0.4 MG SL tablet Place 1 tablet (0.4 mg total) under the tongue every 5 (five) minutes as needed for chest pain. 25 tablet 2   Omega-3 Fatty Acids (FISH OIL) 1200 MG CAPS Take 2,400 mg by mouth 2 (two) times daily.     No current facility-administered medications on file prior  to visit.    Allergies  Allergen Reactions   Ranolazine     Chest discomfort/Reflux   Statins Other (See Comments)    myalgias    Assessment/Plan:  1. Hyperlipidemia - Patient most recent LDL 128 which is above goal of <55. Since patient intolerant to statins, next step would be PCSK9i.  Using [employer], educated patient on mechanism of action, storage, site selection, administration, and possible adverse effects.  Patient has Public relations account executive. Will complete PA and activate copay card. Patient voiced understanding. Recheck lipid panel in 2-3 months.    Start Repatha 140mg  sq q 14 days  Continue Zetia 10mg  daily Recheck lipid panel in 2-3 months  Karren Cobble, PharmD, [facility], [place], [place], [place], [place], [address] Phone: [phone], Fax: [phone]

## 2021-04-05 ENCOUNTER — Other Ambulatory Visit: Payer: Self-pay

## 2021-04-05 ENCOUNTER — Ambulatory Visit (INDEPENDENT_AMBULATORY_CARE_PROVIDER_SITE_OTHER): Payer: Managed Care, Other (non HMO)

## 2021-04-05 ENCOUNTER — Ambulatory Visit (INDEPENDENT_AMBULATORY_CARE_PROVIDER_SITE_OTHER): Payer: Managed Care, Other (non HMO) | Admitting: Physician Assistant

## 2021-04-05 ENCOUNTER — Encounter: Payer: Self-pay | Admitting: Physician Assistant

## 2021-04-05 ENCOUNTER — Other Ambulatory Visit: Payer: Self-pay | Admitting: Physician Assistant

## 2021-04-05 VITALS — Ht 71.0 in | Wt 210.0 lb

## 2021-04-05 DIAGNOSIS — M542 Cervicalgia: Secondary | ICD-10-CM

## 2021-04-05 DIAGNOSIS — M545 Low back pain, unspecified: Secondary | ICD-10-CM

## 2021-04-05 DIAGNOSIS — G8929 Other chronic pain: Secondary | ICD-10-CM

## 2021-04-05 NOTE — Progress Notes (Signed)
Office Visit Note   Patient: Jacob Collier           Date of Birth: 02-Aug-1955           MRN: [id number] Visit Date: 04/05/2021              Requested by: [name], MD [address],  [place] [address] PCP: [name], MD  Chief Complaint  Patient presents with   Lower Back - Pain      HPI: Patient is a pleasant 66 year old gentleman who has a long history of cervical and lower back pain.  He believes at some point he did see someone here but he is unsure who.  He is status post left total hip done by Dr. Ninfa Linden has seen his PA Gordy Levan about a year ago for lower back pain.  None of this is changed.  He denies any loss of bowel or bladder function or any weakness in his upper or lower extremities.  His lower back is tolerable but he complains of pain in his lower thoracic spine with radiation into his left trapezius.  He has recently had quadruple bypass surgery in November and also been treated for prostate cancer so has had to put his back and neck on the back burner.  He says his neck his neck is the big issue today  Assessment & Plan: Visit Diagnoses:  1. Neck pain   2. Chronic midline low back pain, unspecified whether sciatica present   3. Cervicalgia     Plan: He has degenerative changes both in the lumbar and cervical spine.  Certainly the cervical spine is much worse.  We will order an MRI.  He may be a candidate for epidural steroid injections  Follow-Up Instructions: No follow-ups on file.   Ortho Exam  Patient is alert, oriented, no adenopathy, well-dressed, normal affect, normal respiratory effort. Examination he has no gross tenderness to palpation or step-offs of the cervical lower back.  Most of his tenderness in the cervical spine is the lower self cervical spine at the level C7-T1.  He has equal grip strength good abductor strength abductor strength.  Lower back bilateral lower extremity strength is 5 out of 5 and  equivalent on dorsiflexors plantar flexors and flexion of his hip.  No loss of sensation  Imaging: No results found. No images are attached to the encounter.  Labs: Lab Results  Component Value Date   HGBA1C 5.8 (H) 03/04/2021   HGBA1C 5.8 (H) 01/15/2021   HGBA1C 5.8 (H) 11/28/2019   ESRSEDRATE 9 04/01/2020   CRP 1 10/26/2020   REPTSTATUS 09/26/2014 FINAL 09/24/2014   CULT  09/24/2014    >=100,000 COLONIES/mL KLEBSIELLA PNEUMONIAE Performed at [facility] 09/24/2014     Lab Results  Component Value Date   ALBUMIN 3.8 03/17/2021   ALBUMIN 4.2 03/04/2021   ALBUMIN 2.9 (L) 01/23/2021    Lab Results  Component Value Date   MG 2.2 01/20/2021   MG 2.2 01/20/2021   MG 2.3 01/19/2021   No results found for: VD25OH  No results found for: PREALBUMIN CBC EXTENDED Latest Ref Rng & Units 03/17/2021 03/04/2021 01/22/2021  WBC 4.0 - 10.5 K/uL 9.3 7.0 8.6  RBC 4.22 - 5.81 MIL/uL 5.01 5.47 3.83(L)  HGB 13.0 - 17.0 g/dL 14.6 15.8 11.5(L)  HCT 39.0 - 52.0 % 44.5 48.7 34.1(L)  PLT 150 - 400 K/uL 170 216 125(L)  NEUTROABS 1.7 - 7.7 K/uL 7.6 3.8 -  LYMPHSABS 0.7 - 4.0 K/uL 1.2 2.2 -     Body mass index is 29.29 kg/m.  Orders:  Orders Placed This Encounter  Procedures   XR Cervical Spine 2 or 3 views   XR Lumbar Spine 2-3 Views   MR Cervical Spine w/o contrast   No orders of the defined types were placed in this encounter.    Procedures: No procedures performed  Clinical Data: No additional findings.  ROS:  All other systems negative, except as noted in the HPI. Review of Systems  Objective: Vital Signs: Ht 5\' 11"  (1.803 m)    Wt 210 lb (95.3 kg)    BMI 29.29 kg/m   Specialty Comments:  No specialty comments available.  PMFS History: Patient Active Problem List   Diagnosis Date Noted   Hyperglycemia 03/04/2021   Urethral stricture 01/21/2021   S/P CABG x 4 01/19/2021   Other forms of angina pectoris ([facility])  12/24/2020   Hypertension 12/08/2020   Paroxysmal supraventricular tachycardia ([facility]) 11/26/2020   Polycythemia 06/22/2020   Chronic arthralgias of knees and hips 05/19/2020   Myalgia due to statin 05/19/2020   Bursitis of left shoulder 11/28/2019   ED (erectile dysfunction) 10/22/2019   Sciatica without back pain, left 10/22/2019   History of total left hip replacement 10/10/2019   Primary insomnia 07/05/2019   Anxiety    Allergic rhinitis 05/16/2019   CAD (coronary artery disease) 01/18/2019   H/O heart artery stent 01/18/2019   Sigmoid stricture ([facility]) 04/13/2018   Osteoarthritis of left hip 11/10/2015   Status post left hip replacement 11/10/2015   Diverticulosis 09/24/2014   Prostate cancer ([facility]) 09/24/2014   Chronic back pain 09/24/2014   Dyspnea 06/06/2014   Murmur 06/06/2014   Past Medical History:  Diagnosis Date   Arthritis    Coronary artery disease    s/p prior stenting to RCA. Last cath in 05/2019 showed in-stent CTO of RCA and CTO of LCX (medical therpay recommended)   Diverticulosis    Dyspnea    Hard of hearing    Hyperlipidemia    Hypertension    hx of elevation on lyrica, off med and now normal    Paroxysmal SVT (supraventricular tachycardia) (HCC)    Prostate cancer ([facility])    Wears glasses     Family History  Problem Relation Age of Onset   Alzheimer's disease Mother    Stroke Father    Diabetes Sister    Heart attack Maternal Grandmother    Cancer Maternal Uncle        prostate   Prostate cancer Maternal Uncle    Cancer Maternal Uncle        prostate   Prostate cancer Maternal Uncle    Colon polyps Brother    Colon cancer Neg Hx    Esophageal cancer Neg Hx    Rectal cancer Neg Hx    Stomach cancer Neg Hx     Past Surgical History:  Procedure Laterality Date   CARDIAC CATHETERIZATION     with 2 stents    COLECTOMY  05/2018   COLONOSCOPY     CORONARY ARTERY BYPASS GRAFT N/A 01/19/2021   Procedure: CORONARY ARTERY BYPASS GRAFTING (CABG), ON  PUMP TIMES FOUR, USING LEFT INTERNAL MAMMARY ARTERY AND ENDOSOCPICALLY HARVESTED RIGHT GREATER SAPHENOUS VEIN;  Surgeon: Lajuana Matte, MD;  Location: [facility];  Service: Open Heart Surgery;  Laterality: N/A;   CYSTOSCOPY WITH URETHRAL DILATATION N/A 01/14/2021  Procedure: CYSTOSCOPY WITH BALLOON  DILATATION;  Surgeon: Bjorn Loser, MD;  Location: [facility];  Service: Urology;  Laterality: N/A;   EYE SURGERY  02/2020   bilaterl cataracts   IR THORACENTESIS ASP PLEURAL SPACE W/IMG GUIDE  02/02/2021   LEFT HEART CATH AND CORONARY ANGIOGRAPHY N/A 12/24/2020   Procedure: LEFT HEART CATH AND CORONARY ANGIOGRAPHY;  Surgeon: Martinique, Peter M, MD;  Location: [facility];  Service: Cardiovascular;  Laterality: N/A;   PROSTATECTOMY     TEE WITHOUT CARDIOVERSION N/A 01/19/2021   Procedure: TRANSESOPHAGEAL ECHOCARDIOGRAM (TEE);  Surgeon: Lajuana Matte, MD;  Location: [facility];  Service: Open Heart Surgery;  Laterality: N/A;   TOTAL HIP ARTHROPLASTY Left 11/10/2015   Procedure: LEFT TOTAL HIP ARTHROPLASTY ANTERIOR APPROACH;  Surgeon: Mcarthur Rossetti, MD;  Location: [facility];  Service: Orthopedics;  Laterality: Left;   Social History   Occupational History   Occupation: Employed at [employer]  Tobacco Use   Smoking status: Never   Smokeless tobacco: Never  Vaping Use   Vaping Use: Never used  Substance and Sexual Activity   Alcohol use: Not Currently    Alcohol/week: 0.0 standard drinks    Comment: rarely   Drug use: No   Sexual activity: Yes    Partners: Female    Birth control/protection: None

## 2021-04-07 DIAGNOSIS — D225 Melanocytic nevi of trunk: Secondary | ICD-10-CM | POA: Diagnosis not present

## 2021-04-07 DIAGNOSIS — L739 Follicular disorder, unspecified: Secondary | ICD-10-CM | POA: Diagnosis not present

## 2021-04-07 DIAGNOSIS — L570 Actinic keratosis: Secondary | ICD-10-CM | POA: Diagnosis not present

## 2021-04-07 DIAGNOSIS — L814 Other melanin hyperpigmentation: Secondary | ICD-10-CM | POA: Diagnosis not present

## 2021-04-19 ENCOUNTER — Other Ambulatory Visit: Payer: Self-pay | Admitting: Cardiovascular Disease

## 2021-04-19 DIAGNOSIS — I2510 Atherosclerotic heart disease of native coronary artery without angina pectoris: Secondary | ICD-10-CM

## 2021-04-20 ENCOUNTER — Telehealth: Payer: Self-pay | Admitting: Orthopaedic Surgery

## 2021-04-20 NOTE — Telephone Encounter (Signed)
LMOM for pt to return call to sch MRI Csp review with Dr. Erlene Senters, after 04/30/21

## 2021-04-21 ENCOUNTER — Other Ambulatory Visit: Payer: Self-pay | Admitting: Cardiovascular Disease

## 2021-04-21 ENCOUNTER — Telehealth: Payer: Self-pay | Admitting: Cardiovascular Disease

## 2021-04-21 NOTE — Telephone Encounter (Signed)
*  STAT* If patient is at the pharmacy, call can be transferred to refill team.   1. Which medications need to be refilled? (please list name of each medication and dose if known)  clopidogrel (PLAVIX) 75 MG tablet  2. Which pharmacy/location (including street and city if local pharmacy) is medication to be sent to? [id number] - [place], [place]  3. Do they need a 30 day or 90 day supply?  90 day supply

## 2021-04-22 MED ORDER — CLOPIDOGREL BISULFATE 75 MG PO TABS: 75.0000 mg | ORAL_TABLET | Freq: Every day | ORAL | 1 refills | Status: DC

## 2021-04-22 NOTE — Telephone Encounter (Signed)
Refill for Clopidogrel (Plavix) 75 MG sent to the pharmacy.

## 2021-04-30 ENCOUNTER — Other Ambulatory Visit: Payer: Managed Care, Other (non HMO)

## 2021-05-09 ENCOUNTER — Ambulatory Visit
Admission: RE | Admit: 2021-05-09 | Discharge: 2021-05-09 | Disposition: A | Discharge status: Home / Self Care | Payer: Managed Care, Other (non HMO) | Source: Ambulatory Visit | Attending: Physician Assistant | Admitting: Physician Assistant

## 2021-05-09 ENCOUNTER — Other Ambulatory Visit: Payer: Self-pay | Admitting: Cardiovascular Disease

## 2021-05-09 ENCOUNTER — Other Ambulatory Visit: Payer: Self-pay

## 2021-05-09 DIAGNOSIS — M542 Cervicalgia: Secondary | ICD-10-CM

## 2021-05-13 ENCOUNTER — Telehealth: Payer: Self-pay | Admitting: Physical Medicine and Rehabilitation

## 2021-05-13 ENCOUNTER — Other Ambulatory Visit: Payer: Self-pay | Admitting: Physician Assistant

## 2021-05-13 DIAGNOSIS — M542 Cervicalgia: Secondary | ICD-10-CM

## 2021-05-13 NOTE — Telephone Encounter (Signed)
Patient called. Would like an appointment with Dr. Newton  

## 2021-05-18 ENCOUNTER — Ambulatory Visit (INDEPENDENT_AMBULATORY_CARE_PROVIDER_SITE_OTHER): Payer: Managed Care, Other (non HMO) | Admitting: Legal Medicine

## 2021-05-18 ENCOUNTER — Other Ambulatory Visit: Payer: Self-pay

## 2021-05-18 ENCOUNTER — Encounter: Payer: Self-pay | Admitting: Legal Medicine

## 2021-05-18 ENCOUNTER — Ambulatory Visit (HOSPITAL_BASED_OUTPATIENT_CLINIC_OR_DEPARTMENT_OTHER): Payer: Managed Care, Other (non HMO) | Admitting: Internal Medicine

## 2021-05-18 VITALS — BP 138/78 | HR 51 | Temp 97.3°F | Ht 71.0 in | Wt 219.0 lb

## 2021-05-18 DIAGNOSIS — M545 Low back pain, unspecified: Secondary | ICD-10-CM

## 2021-05-18 MED ORDER — CYCLOBENZAPRINE HCL 10 MG PO TABS: 10.0000 mg | ORAL_TABLET | Freq: Three times a day (TID) | ORAL | 0 refills | Status: DC | PRN

## 2021-05-18 MED ORDER — HYDROCODONE-ACETAMINOPHEN 10-325 MG PO TABS: 1.0000 | ORAL_TABLET | Freq: Three times a day (TID) | ORAL | 0 refills | Status: AC | PRN

## 2021-05-18 NOTE — Progress Notes (Signed)
 Acute Office Visit  Subjective:    Patient ID: Jacob Collier, male    DOB: 1956/02/21, 66 y.o.   MRN: [id number]  Chief Complaint  Patient presents with   Fall    HPI: Patient is in today for a fall. Patient states he rolled out of the bed 2 days ago. 05/15/2021 Has been hurting-left wrist and thumb, left knee and lower back. Has been taking tylenol  which has not helped. Applying ice and heat which helps some. Has Ortho appt for c6-c7 and t1 for 03/21.Has appointment to [facility] orthopedic in 2 weeks, has MRI. Pain in low back.  No bladder or bowel changes, no red flags.  Past Medical History:  Diagnosis Date   Arthritis    Coronary artery disease    s/p prior stenting to RCA. Last cath in 05/2019 showed in-stent CTO of RCA and CTO of LCX (medical therpay recommended)   Diverticulosis    Dyspnea    Hard of hearing    Hyperlipidemia    Hypertension    hx of elevation on lyrica , off med and now normal    Paroxysmal SVT (supraventricular tachycardia) (HCC)    Prostate cancer (HCC)    Wears glasses     Past Surgical History:  Procedure Laterality Date   CARDIAC CATHETERIZATION     with 2 stents    COLECTOMY  05/2018   COLONOSCOPY     CORONARY ARTERY BYPASS GRAFT N/A 01/19/2021   Procedure: CORONARY ARTERY BYPASS GRAFTING (CABG), ON PUMP TIMES FOUR, USING LEFT INTERNAL MAMMARY ARTERY AND ENDOSOCPICALLY HARVESTED RIGHT GREATER SAPHENOUS VEIN;  Surgeon: Hilarie Lovely, MD;  Location: [facility];  Service: Open Heart Surgery;  Laterality: N/A;   CYSTOSCOPY WITH URETHRAL DILATATION N/A 01/14/2021   Procedure: CYSTOSCOPY WITH BALLOON  DILATATION;  Surgeon: Erman Hayward, MD;  [facility];  Service: Urology;  Laterality: N/A;   EYE SURGERY  02/2020   bilaterl cataracts   IR THORACENTESIS ASP PLEURAL SPACE W/IMG GUIDE  02/02/2021   LEFT HEART CATH AND CORONARY ANGIOGRAPHY N/A 12/24/2020   Procedure: LEFT HEART CATH AND CORONARY ANGIOGRAPHY;  Surgeon: Swaziland, Peter M, MD;   Location: [facility];  Service: Cardiovascular;  Laterality: N/A;   PROSTATECTOMY     TEE WITHOUT CARDIOVERSION N/A 01/19/2021   Procedure: TRANSESOPHAGEAL ECHOCARDIOGRAM (TEE);  Surgeon: Hilarie Lovely, MD;  Location: [facility];  Service: Open Heart Surgery;  Laterality: N/A;   TOTAL HIP ARTHROPLASTY Left 11/10/2015   Procedure: LEFT TOTAL HIP ARTHROPLASTY ANTERIOR APPROACH;  Surgeon: Arnie Lao, MD;  Location: [facility];  Service: Orthopedics;  Laterality: Left;    Family History  Problem Relation Age of Onset   Alzheimer's disease Mother    Stroke Father    Diabetes Sister    Heart attack Maternal Grandmother    Cancer Maternal Uncle        prostate   Prostate cancer Maternal Uncle    Cancer Maternal Uncle        prostate   Prostate cancer Maternal Uncle    Colon polyps Brother    Colon cancer Neg Hx    Esophageal cancer Neg Hx    Rectal cancer Neg Hx    Stomach cancer Neg Hx     Social History   Socioeconomic History   Marital status: Married    Spouse name: Not on file   Number of children: 0   Years of education: Not on file   Highest education level: Not  on file  Occupational History   Occupation: Employed at [employer]  Tobacco Use   Smoking status: Never   Smokeless tobacco: Never  Vaping Use   Vaping Use: Never used  Substance and Sexual Activity   Alcohol use: Not Currently    Alcohol/week: 0.0 standard drinks    Comment: rarely   Drug use: No   Sexual activity: Yes    Partners: Female    Birth control/protection: None  Other Topics Concern   Not on file  Social History Narrative   Not on file   Social Determinants of Health   Financial Resource Strain: Not on file  Food Insecurity: Not on file  Transportation Needs: Not on file  Physical Activity: Not on file  Stress: Not on file  Social Connections: Not on file  Intimate Partner Violence: Not on file    Outpatient Medications Prior to Visit  Medication Sig Dispense  Refill   acetaminophen  (TYLENOL ) 500 MG tablet Take 1,000 mg by mouth every 8 (eight) hours as needed for mild pain.     amiodarone  (PACERONE ) 200 MG tablet Take 1 tablet (200 mg total) by mouth 2 (two) times daily. X 7 days, then decrease to 200 mg daily (Patient taking differently: Take 200 mg by mouth daily. Take 1 tablet by mouth daily) 60 tablet 1   amLODipine  (NORVASC ) 5 MG tablet Take 1 tablet (5 mg total) by mouth daily. 30 tablet 2   aspirin  EC 81 MG tablet Take 81 mg by mouth daily. Swallow whole.     clonazePAM  (KLONOPIN ) 1 MG tablet Take 1 tablet (1 mg total) by mouth 2 (two) times daily as needed for anxiety. 60 tablet 3   clopidogrel  (PLAVIX ) 75 MG tablet Take 1 tablet (75 mg total) by mouth daily. Stop taking plavix  on Saturday Nov. 5, 2022 90 tablet 1   Eszopiclone  3 MG TABS TAKE 1 TABLET BY MOUTH EVERYDAY AT BEDTIME 30 tablet 2   Evolocumab  (REPATHA  SURECLICK) 140 MG/ML SOAJ Inject 140 mg into the skin every 14 (fourteen) days. 2 mL 11   ezetimibe  (ZETIA ) 10 MG tablet TAKE 1 TABLET BY MOUTH EVERY DAY 90 tablet 3   metoprolol  succinate (TOPROL -XL) 25 MG 24 hr tablet Take 0.5 tablets (12.5 mg total) by mouth daily. Take with or immediately following a meal. (Patient taking differently: Take 25 mg by mouth daily. Take with or immediately following a meal.) 45 tablet 2   nitroGLYCERIN  (NITROSTAT ) 0.4 MG SL tablet Place 1 tablet (0.4 mg total) under the tongue every 5 (five) minutes as needed for chest pain. 25 tablet 2   Omega-3 Fatty Acids (FISH OIL) 1200 MG CAPS Take 2,400 mg by mouth 2 (two) times daily.     No facility-administered medications prior to visit.    Allergies  Allergen Reactions   Ranolazine     Chest discomfort/Reflux   Crestor  [Rosuvastatin ]     myalgias   Lipitor  [Atorvastatin ]     myalgias   Pravastatin      myalgias   Statins Other (See Comments)    myalgias    Review of Systems  Constitutional:  Negative for chills, diaphoresis, fatigue and fever.   HENT:  Negative for congestion, ear pain and sore throat.   Eyes:  Negative for visual disturbance.  Respiratory:  Negative for cough and shortness of breath.   Cardiovascular:  Negative for chest pain and leg swelling.  Gastrointestinal:  Negative for abdominal pain, constipation, diarrhea, nausea and vomiting.  Genitourinary:  Negative for dysuria and urgency.  Musculoskeletal:  Negative for arthralgias and myalgias.  Neurological:  Negative for dizziness and headaches.  Psychiatric/Behavioral:  Negative for dysphoric mood.       Objective:    Physical Exam Vitals reviewed.  Constitutional:      General: He is not in acute distress.    Appearance: Normal appearance.  HENT:     Head: Normocephalic.     Right Ear: Tympanic membrane normal.     Left Ear: Tympanic membrane normal.  Neck:     Comments: Radicular pain to left Cardiovascular:     Rate and Rhythm: Normal rate and regular rhythm.     Pulses: Normal pulses.     Heart sounds: Normal heart sounds. No murmur heard.   No gallop.  Pulmonary:     Effort: Pulmonary effort is normal. No respiratory distress.     Breath sounds: Normal breath sounds. No wheezing.  Abdominal:     General: Bowel sounds are normal.  Musculoskeletal:        General: Tenderness present.     Cervical back: Tenderness present.       Back:     Right lower leg: No edema.     Left lower leg: No edema.     Comments: Flexion 20 degrees, extension zero with pain, Negative SLR, normal sensation in feet.  Skin:    General: Skin is warm.     Capillary Refill: Capillary refill takes less than 2 seconds.  Neurological:     General: No focal deficit present.     Mental Status: He is alert. Mental status is at baseline.  Psychiatric:        Mood and Affect: Mood normal.        Thought Content: Thought content normal.    BP 138/78   Pulse (!) 51   Temp (!) 97.3 F (36.3 C)   Ht 5\' 11"  (1.803 m)   Wt 219 lb (99.3 kg)   SpO2 100%   BMI 30.54  kg/m  Wt Readings from Last 3 Encounters:  05/18/21 219 lb (99.3 kg)  04/05/21 210 lb (95.3 kg)  03/31/21 217 lb 6.4 oz (98.6 kg)    Health Maintenance Due  Topic Date Due   TETANUS/TDAP  Never done   Zoster Vaccines- Shingrix (1 of 2) Never done   Pneumonia Vaccine 68+ Years old (1 - PCV) Never done   INFLUENZA VACCINE  10/05/2020    There are no preventive care reminders to display for this patient.   No results found for: TSH Lab Results  Component Value Date   WBC 9.3 03/17/2021   HGB 14.6 03/17/2021   HCT 44.5 03/17/2021   MCV 88.8 03/17/2021   PLT 170 03/17/2021   Lab Results  Component Value Date   NA 137 03/17/2021   K 3.8 03/17/2021   CO2 21 (L) 03/17/2021   GLUCOSE 142 (H) 03/17/2021   BUN 24 (H) 03/17/2021   CREATININE 1.17 03/17/2021   BILITOT 0.5 03/17/2021   ALKPHOS 63 03/17/2021   AST 17 03/17/2021   ALT 30 03/17/2021   PROT 6.7 03/17/2021   ALBUMIN  3.8 03/17/2021   CALCIUM  8.7 (L) 03/17/2021   ANIONGAP 8 03/17/2021   EGFR 77 03/04/2021   Lab Results  Component Value Date   CHOL 209 (H) 03/04/2021   Lab Results  Component Value Date   HDL 34 (L) 03/04/2021   Lab Results  Component Value Date   LDLCALC 128 (H)  03/04/2021   Lab Results  Component Value Date   TRIG 263 (H) 03/04/2021   Lab Results  Component Value Date   CHOLHDL 6.1 (H) 03/04/2021   Lab Results  Component Value Date   HGBA1C 5.8 (H) 03/04/2021       Assessment & Plan:   Problem List Items Addressed This Visit   None Visit Diagnoses     Lumbar pain    -  Primary   Relevant Medications   HYDROcodone -acetaminophen  (NORCO) 10-325 MG tablet   cyclobenzaprine  (FLEXERIL ) 10 MG tablet Patient fell off bed and landed on back with left arm hitting night stand.  He has x-rays and MRI in past.  He is to see ortho and does not want acute x-rays.  Treat with pain relief and muscle relaxers.  He has exercises at home.      Meds ordered this encounter  Medications    HYDROcodone -acetaminophen  (NORCO) 10-325 MG tablet    Sig: Take 1 tablet by mouth every 8 (eight) hours as needed for up to 5 days.    Dispense:  15 tablet    Refill:  0   cyclobenzaprine  (FLEXERIL ) 10 MG tablet    Sig: Take 1 tablet (10 mg total) by mouth 3 (three) times daily as needed for muscle spasms.    Dispense:  30 tablet    Refill:  0       Follow-up: Return if symptoms worsen or fail to improve.  An After Visit Summary was printed and given to the patient.  Rea Cambridge, MD [facility] [phone]

## 2021-05-19 ENCOUNTER — Other Ambulatory Visit: Payer: Self-pay | Admitting: Legal Medicine

## 2021-05-19 DIAGNOSIS — F5101 Primary insomnia: Secondary | ICD-10-CM

## 2021-05-20 ENCOUNTER — Ambulatory Visit: Payer: Managed Care, Other (non HMO) | Admitting: Cardiovascular Disease

## 2021-05-21 ENCOUNTER — Telehealth: Payer: Self-pay

## 2021-05-21 NOTE — Telephone Encounter (Signed)
Patient is asking if can you prescribe some lidocaine patches for his hip pain.  ? ?She mentioned that he has some trouble with his legs. He feels that they are weak; He has an appointment with Ortho on 3/21. He cannot go to work, so he asked if can you extend the excuse from work until the date that he sees Ortho. Please advice. ?

## 2021-05-22 ENCOUNTER — Other Ambulatory Visit: Payer: Self-pay | Admitting: Legal Medicine

## 2021-05-22 DIAGNOSIS — M5432 Sciatica, left side: Secondary | ICD-10-CM

## 2021-05-22 MED ORDER — LIDOCAINE 4 % EX PTCH: 1.0000 | MEDICATED_PATCH | Freq: Three times a day (TID) | CUTANEOUS | 3 refills | Status: DC

## 2021-05-24 ENCOUNTER — Encounter: Payer: Self-pay | Admitting: Legal Medicine

## 2021-05-25 ENCOUNTER — Encounter: Payer: Self-pay | Admitting: Orthopaedic Surgery

## 2021-05-25 ENCOUNTER — Ambulatory Visit (INDEPENDENT_AMBULATORY_CARE_PROVIDER_SITE_OTHER): Payer: Managed Care, Other (non HMO) | Admitting: Orthopaedic Surgery

## 2021-05-25 ENCOUNTER — Ambulatory Visit (INDEPENDENT_AMBULATORY_CARE_PROVIDER_SITE_OTHER): Payer: Managed Care, Other (non HMO)

## 2021-05-25 DIAGNOSIS — G8929 Other chronic pain: Secondary | ICD-10-CM

## 2021-05-25 DIAGNOSIS — M79645 Pain in left finger(s): Secondary | ICD-10-CM

## 2021-05-25 DIAGNOSIS — I2510 Atherosclerotic heart disease of native coronary artery without angina pectoris: Secondary | ICD-10-CM

## 2021-05-25 MED ORDER — LIDOCAINE HCL 1 % IJ SOLN
0.5000 mL | INTRAMUSCULAR | Status: AC | PRN
  Administered 2021-05-25: .5 mL

## 2021-05-25 MED ORDER — METHYLPREDNISOLONE ACETATE 40 MG/ML IJ SUSP
20.0000 mg | INTRAMUSCULAR | Status: AC | PRN
  Administered 2021-05-25: 20 mg via INTRA_ARTICULAR

## 2021-05-25 NOTE — Progress Notes (Signed)
 Office Visit Note   Patient: Jacob Collier           Date of Birth: 15-Dec-1955           MRN: [id number] Visit Date: 05/25/2021              Requested by: Audie Bleacher, MD [address] [place],  [place] [address] PCP: Audie Bleacher, MD   Assessment & Plan: Visit Diagnoses: Neck pain.  Left thumb pain  Plan: 66 year old gentleman with multiple issues including cervical spine and lower back knee and left thumb pain.  He did have a recent MRI of his cervical spine which is the most significant pain to him at the time.  I reviewed the MRI with him and with Dr. Daisey Dryer.  He has severe canal stenosis of the cervical spine.  Dr. Daisey Dryer would like to see him in consultation at a regular office visit to determine if epidural steroid injections would be of any help.  With regards to his thumb we did go forward and give him an injection into the navicular trapezium joint talked him about using Voltaren gel  Follow-Up Instructions: No follow-ups on file.   Orders:  No orders of the defined types were placed in this encounter.  No orders of the defined types were placed in this encounter.     Procedures: Medium Joint Inj: L intercarpal on 05/25/2021 4:33 PM Indications: pain and diagnostic evaluation Details: 27 G 1.5 in needle Medications: 0.5 mL lidocaine  1 %; 20 mg methylPREDNISolone  acetate 40 MG/ML Outcome: tolerated well, no immediate complications Procedure, treatment alternatives, risks and benefits explained, specific risks discussed. Consent was given by the patient.      Clinical Data: No additional findings.   Subjective: Chief Complaint  Patient presents with   Neck - Follow-up    MRI review  Patient presents today for follow up on his neck. He had an MRI and is here today for those results.  HPI  Review of Systems  All other systems reviewed and are negative.   Objective: Vital Signs: There were no vitals taken for this  visit.  Physical Exam Constitutional:      Appearance: Normal appearance.  Pulmonary:     Effort: Pulmonary effort is normal.  Neurological:     General: No focal deficit present.     Mental Status: He is alert.    Ortho Exam Examination of his neck he has pain with flexion extension tender to palpation over the cervical spine.  He has strength is 5 out of 5 has no paresthesias today.  Good strength with resisted abduction extension flexion of his elbows and hands and wrist.  Left thumb tenderness to palpation at the base of the thumb and the carpal joint area rather than the MCP area.  Brisk capillary refill Specialty Comments:  MRI CERVICAL SPINE WITHOUT CONTRAST   TECHNIQUE: Multiplanar, multisequence MR imaging of the cervical spine was performed. No intravenous contrast was administered.   COMPARISON:  X-ray cervical 04/05/2021. CT cervical spine November 30, 2011.   FINDINGS: Alignment: 3 mm anterolisthesis of C4 over C5 and 4 mm anterolisthesis of C7 over T1. Small retrolisthesis of C5 over C6.   Vertebrae: No fracture, evidence of discitis, or bone lesion. Endplate degenerative changes at C5-6 and C6-7.   Cord: Mass effect on the cord at C5-6 and C6-7 with increase of the T2 signal within the cord at C5-6.   Posterior Fossa, vertebral arteries,  paraspinal tissues: Negative.   Disc levels:   C2-3: Facet degenerative changes. No spinal canal or neural foraminal stenosis.   C3-4: Posterior disc osteophyte complex resulting in mild spinal canal stenosis. Uncovertebral and facet degenerative changes with prominent hypertrophy of the right facet joint resulting in severe right neural foraminal narrowing.   C4-5: Posterior disc osteophyte complex resulting in mild spinal canal stenosis. Uncovertebral and facet degenerative changes. Prominent hypertrophy of the right facet joint resulting in severe bilateral neural foraminal narrowing.   C5-6: Posterior disc  osteophyte complex resulting in moderate to severe spinal canal stenosis with mass effect on the cord and increased T2 signal within the cord suggesting cord edema versus myelomalacia. Uncovertebral and facet degenerative changes resulting in severe bilateral neural foraminal narrowing.   C6-7: Posterior disc osteophyte complex resulting in moderate spinal canal stenosis with mild mass effect on the cord. Uncovertebral and facet degenerative changes resulting in severe bilateral neural foraminal narrowing.   C7-T1: Anterolisthesis, disc bulge/disc uncovering and hypertrophic facet degenerative changes resulting in moderate bilateral neural foraminal narrowing. No significant spinal canal stenosis.   IMPRESSION: 1. Advanced degenerative changes of the cervical spine, worst at C5-6 where there is moderate to severe spinal canal stenosis with mass effect on the cord and increased cord signal on T2, suggesting cord edema versus myelomalacia. 2. Moderate spinal canal stenosis at C6-7. 3. Severe bilateral neural foraminal narrowing at C4-5, C5-6 and C6-7 and on the right at C3-4. 4. Moderate bilateral neural foraminal narrowing at C7-T1.     Electronically Signed   By: Katyucia  de Macedo Rodrigues M.D.   On: 05/10/2021 09:52  Imaging: No results found.   PMFS History: Patient Active Problem List   Diagnosis Date Noted   Hyperglycemia 03/04/2021   Urethral stricture 01/21/2021   S/P CABG x 4 01/19/2021   Other forms of angina pectoris (HCC) 12/24/2020   Hypertension 12/08/2020   Paroxysmal supraventricular tachycardia (HCC) 11/26/2020   Polycythemia 06/22/2020   Chronic arthralgias of knees and hips 05/19/2020   Myalgia due to statin 05/19/2020   Bursitis of left shoulder 11/28/2019   ED (erectile dysfunction) 10/22/2019   Sciatica without back pain, left 10/22/2019   History of total left hip replacement 10/10/2019   Primary insomnia 07/05/2019   Anxiety    Allergic  rhinitis 05/16/2019   CAD (coronary artery disease) 01/18/2019   H/O heart artery stent 01/18/2019   Sigmoid stricture (HCC) 04/13/2018   Osteoarthritis of left hip 11/10/2015   Status post left hip replacement 11/10/2015   Diverticulosis 09/24/2014   Prostate cancer (HCC) 09/24/2014   Chronic back pain 09/24/2014   Dyspnea 06/06/2014   Murmur 06/06/2014   Past Medical History:  Diagnosis Date   Arthritis    Coronary artery disease    s/p prior stenting to RCA. Last cath in 05/2019 showed in-stent CTO of RCA and CTO of LCX (medical therpay recommended)   Diverticulosis    Dyspnea    Hard of hearing    Hyperlipidemia    Hypertension    hx of elevation on lyrica , off med and now normal    Paroxysmal SVT (supraventricular tachycardia) (HCC)    Prostate cancer (HCC)    Wears glasses     Family History  Problem Relation Age of Onset   Alzheimer's disease Mother    Stroke Father    Diabetes Sister    Heart attack Maternal Grandmother    Cancer Maternal Uncle        prostate  Prostate cancer Maternal Uncle    Cancer Maternal Uncle        prostate   Prostate cancer Maternal Uncle    Colon polyps Brother    Colon cancer Neg Hx    Esophageal cancer Neg Hx    Rectal cancer Neg Hx    Stomach cancer Neg Hx     Past Surgical History:  Procedure Laterality Date   CARDIAC CATHETERIZATION     with 2 stents    COLECTOMY  05/2018   COLONOSCOPY     CORONARY ARTERY BYPASS GRAFT N/A 01/19/2021   Procedure: CORONARY ARTERY BYPASS GRAFTING (CABG), ON PUMP TIMES FOUR, USING LEFT INTERNAL MAMMARY ARTERY AND ENDOSOCPICALLY HARVESTED RIGHT GREATER SAPHENOUS VEIN;  Surgeon: Hilarie Lovely, MD;  Location: [facility] OR;  Service: Open Heart Surgery;  Laterality: N/A;   CYSTOSCOPY WITH URETHRAL DILATATION N/A 01/14/2021   Procedure: CYSTOSCOPY WITH BALLOON  DILATATION;  Surgeon: Erman Hayward, MD;  Location: [facility];  Service: Urology;  Laterality: N/A;   EYE SURGERY  02/2020   bilaterl  cataracts   IR THORACENTESIS ASP PLEURAL SPACE W/IMG GUIDE  02/02/2021   LEFT HEART CATH AND CORONARY ANGIOGRAPHY N/A 12/24/2020   Procedure: LEFT HEART CATH AND CORONARY ANGIOGRAPHY;  Surgeon: Swaziland, Peter M, MD;  Location: [facility] INVASIVE CV LAB;  Service: Cardiovascular;  Laterality: N/A;   PROSTATECTOMY     TEE WITHOUT CARDIOVERSION N/A 01/19/2021   Procedure: TRANSESOPHAGEAL ECHOCARDIOGRAM (TEE);  Surgeon: Hilarie Lovely, MD;  Location: [facility] OR;  Service: Open Heart Surgery;  Laterality: N/A;   TOTAL HIP ARTHROPLASTY Left 11/10/2015   Procedure: LEFT TOTAL HIP ARTHROPLASTY ANTERIOR APPROACH;  Surgeon: Arnie Lao, MD;  Location: [facility] OR;  Service: Orthopedics;  Laterality: Left;   Social History   Occupational History   Occupation: Employed at [employer]  Tobacco Use   Smoking status: Never   Smokeless tobacco: Never  Vaping Use   Vaping Use: Never used  Substance and Sexual Activity   Alcohol use: Not Currently    Alcohol/week: 0.0 standard drinks    Comment: rarely   Drug use: No   Sexual activity: Yes    Partners: Female    Birth control/protection: None

## 2021-05-27 ENCOUNTER — Ambulatory Visit (INDEPENDENT_AMBULATORY_CARE_PROVIDER_SITE_OTHER): Payer: Managed Care, Other (non HMO) | Admitting: Physical Medicine and Rehabilitation

## 2021-05-27 ENCOUNTER — Encounter: Payer: Self-pay | Admitting: Physical Medicine and Rehabilitation

## 2021-05-27 ENCOUNTER — Other Ambulatory Visit: Payer: Self-pay

## 2021-05-27 DIAGNOSIS — M4802 Spinal stenosis, cervical region: Secondary | ICD-10-CM | POA: Diagnosis not present

## 2021-05-27 DIAGNOSIS — M5412 Radiculopathy, cervical region: Secondary | ICD-10-CM

## 2021-05-27 DIAGNOSIS — I2510 Atherosclerotic heart disease of native coronary artery without angina pectoris: Secondary | ICD-10-CM

## 2021-05-27 DIAGNOSIS — M542 Cervicalgia: Secondary | ICD-10-CM | POA: Diagnosis not present

## 2021-05-27 DIAGNOSIS — M47816 Spondylosis without myelopathy or radiculopathy, lumbar region: Secondary | ICD-10-CM | POA: Diagnosis not present

## 2021-05-27 NOTE — Progress Notes (Signed)
Pt state neck pain that travels to his left shoulder and arm. Pt state pain at his left shoulder blade that shoot though to his chest. Pt state turning his head, movement with his arm and bending makes the pain worse. Pt state he has headaches and dizziness at times.Pt state it hard for him to hold items. 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 Pt state he takes over the counter pain to help ease his pain. ? ?Numeric Pain Rating Scale and Functional Assessment ?Average Pain 9 ?Pain Right Now 4 ?My pain is intermittent, sharp, dull, tingling, and aching ?Pain is worse with: bending, standing, and some activites ?Pain improves with: rest and medication ? ? ?In the last MONTH (on 0-10 scale) has pain interfered with the following? ? ?1. General activity like being  able to carry out your everyday physical activities such as walking, climbing stairs, carrying groceries, or moving a chair?  ?Rating(6) ? ?2. Relation with others like being able to carry out your usual social activities and roles such as  activities at home, at work and in your community. ?Rating(7) ? ?3. Enjoyment of life such that you have  been bothered by emotional problems such as feeling anxious, depressed or irritable?  ?Rating(8) ? ?

## 2021-05-27 NOTE — Progress Notes (Signed)
 Jacob Collier - 66 y.o. male MRN [id number]  Date of birth: 1955-04-28  Office Visit Note: Visit Date: 05/27/2021 PCP: Audie Bleacher, MD Referred by: Persons, Norma Beckers, PA  Subjective: Chief Complaint  Patient presents with   Neck - Pain   Left Shoulder - Pain   Left Arm - Pain   Head - Pain   HPI: Jacob Collier is a 66 y.o. male who comes in today at the request of Norma Beckers Persons, PA for evaluation of chronic, worsening and severe left-sided neck pain radiating to left shoulder and left upper arm.  Patient reports pain has been ongoing for several years.  Patient states pain worsened in 1993 after he sustained a weightlifting injury.  Patient states his pain is exacerbated by movement and activity.  He describes his pain as a constant sharp and sore sensation, currently rates as 8 out of 10.  Patient reports some relief of pain with home exercise regimen, rest and use of medications as needed.  Patient states he has attended formal chiropractic therapy in the past and reports some relief of pain with these treatments.  Patient's recent cervical MRI exhibits multilevel facet hypertrophy, moderate to severe spinal canal stenosis with mass effect on the cord at C5-C6, there is also moderate spinal canal stenosis noted at C6-C7.  Patient reports history of lumbar epidural steroid injections and states good relief of pain with these treatments.  Patient states he has been dealing with several chronic health issues over the last several months, he did have quadruple bypass surgery in November and also has a history of prostate cancer.  Patient denies focal weakness, numbness and tingling.  Patient denies recent trauma or falls.  Incidentally patient also mentioned chronic issues with bilateral lower back pain.  Patient states he did have lumbar MRI performed several years ago and is going to check and see if he has CD with imaging so that we can review.  Patient states that he will like  to repeat lumbar epidural steroid injections, however his neck pain seems to be more concerning at this time.    Review of Systems  Musculoskeletal:  Positive for neck pain.  Neurological:  Negative for tingling, sensory change, focal weakness and weakness.  All other systems reviewed and are negative. Otherwise per HPI.  Assessment & Plan: Visit Diagnoses:    ICD-10-CM   1. Radiculopathy, cervical region  M54.12 Ambulatory referral to Physical Medicine Rehab    Ambulatory referral to Neurosurgery    2. Cervicalgia  M54.2 Ambulatory referral to Physical Medicine Rehab    Ambulatory referral to Neurosurgery    3. Spinal stenosis of cervical region  M48.02 Ambulatory referral to Physical Medicine Rehab    Ambulatory referral to Neurosurgery    4. Spondylosis without myelopathy or radiculopathy, lumbar region  M47.816        Plan: Findings:  Chronic, worsening and severe left-sided neck pain radiating to left lateral and left upper arm.  Patient continues to appreciate and debilitating pain despite good conservative therapies such as home exercise regimen, rest and use of medications.  Patient's clinical presentation and exam are consistent with C5/C6 nerve pattern, he does have severe spinal canal stenosis with mass effect on the cord at C5-C6 on recent cervical MRI.  We believe the neck step is to perform a diagnostic and hopefully therapeutic left C7-T1 interlaminar epidural steroid injection.  Patient is currently taking Plavix  and will consult with his cardiologist to see if  he is able to come off of this medication to have the injection.  We also talked with patient about surgical consultation and feel that this would be beneficial for him at this time.  I did place a referral to Dr. Waymond Hailey at [facility] to discuss treatment options.  No red flag symptoms noted upon exam today.    For the low  Meds & Orders: No orders of the defined types were placed in this  encounter.   Orders Placed This Encounter  Procedures   Ambulatory referral to Physical Medicine Rehab   Ambulatory referral to Neurosurgery    Follow-up: Return for Left C7-T1 interlaminar epidural steroid injection.   Procedures: No procedures performed      Clinical History: MRI CERVICAL SPINE WITHOUT CONTRAST   TECHNIQUE: Multiplanar, multisequence MR imaging of the cervical spine was performed. No intravenous contrast was administered.   COMPARISON:  X-ray cervical 04/05/2021. CT cervical spine November 30, 2011.   FINDINGS: Alignment: 3 mm anterolisthesis of C4 over C5 and 4 mm anterolisthesis of C7 over T1. Small retrolisthesis of C5 over C6.   Vertebrae: No fracture, evidence of discitis, or bone lesion. Endplate degenerative changes at C5-6 and C6-7.   Cord: Mass effect on the cord at C5-6 and C6-7 with increase of the T2 signal within the cord at C5-6.   Posterior Fossa, vertebral arteries, paraspinal tissues: Negative.   Disc levels:   C2-3: Facet degenerative changes. No spinal canal or neural foraminal stenosis.   C3-4: Posterior disc osteophyte complex resulting in mild spinal canal stenosis. Uncovertebral and facet degenerative changes with prominent hypertrophy of the right facet joint resulting in severe right neural foraminal narrowing.   C4-5: Posterior disc osteophyte complex resulting in mild spinal canal stenosis. Uncovertebral and facet degenerative changes. Prominent hypertrophy of the right facet joint resulting in severe bilateral neural foraminal narrowing.   C5-6: Posterior disc osteophyte complex resulting in moderate to severe spinal canal stenosis with mass effect on the cord and increased T2 signal within the cord suggesting cord edema versus myelomalacia. Uncovertebral and facet degenerative changes resulting in severe bilateral neural foraminal narrowing.   C6-7: Posterior disc osteophyte complex resulting in moderate  spinal canal stenosis with mild mass effect on the cord. Uncovertebral and facet degenerative changes resulting in severe bilateral neural foraminal narrowing.   C7-T1: Anterolisthesis, disc bulge/disc uncovering and hypertrophic facet degenerative changes resulting in moderate bilateral neural foraminal narrowing. No significant spinal canal stenosis.   IMPRESSION: 1. Advanced degenerative changes of the cervical spine, worst at C5-6 where there is moderate to severe spinal canal stenosis with mass effect on the cord and increased cord signal on T2, suggesting cord edema versus myelomalacia. 2. Moderate spinal canal stenosis at C6-7. 3. Severe bilateral neural foraminal narrowing at C4-5, C5-6 and C6-7 and on the right at C3-4. 4. Moderate bilateral neural foraminal narrowing at C7-T1.     Electronically Signed   By: Katyucia  de Macedo Rodrigues M.D.   On: 05/10/2021 09:52   He reports that he has never smoked. He has never used smokeless tobacco.  Recent Labs    01/15/21 1520 03/04/21 0855  HGBA1C 5.8* 5.8*    Objective:  VS:  HT:    WT:   BMI:     BP:   HR: bpm  TEMP: ( )  RESP:  Physical Exam Vitals and nursing note reviewed.  HENT:     Head: Normocephalic and atraumatic.     Right  Ear: External ear normal.     Left Ear: External ear normal.     Nose: Nose normal.     Mouth/Throat:     Mouth: Mucous membranes are moist.  Eyes:     Extraocular Movements: Extraocular movements intact.  Cardiovascular:     Rate and Rhythm: Normal rate.     Pulses: Normal pulses.  Pulmonary:     Effort: Pulmonary effort is normal.  Abdominal:     General: Abdomen is flat. There is no distension.  Musculoskeletal:        General: Tenderness present.     Cervical back: Tenderness present.     Comments: Discomfort noted with flexion, extension and side-to-side rotation. Patient has good strength in the upper extremities including 5 out of 5 strength in wrist extension, long  finger flexion and APB.  There is no atrophy of the hands intrinsically. Dysesthesias noted to let C5/C6 dermatomes.  Sensation intact bilaterally. Negative Hoffman's sign.   Skin:    General: Skin is warm and dry.     Capillary Refill: Capillary refill takes less than 2 seconds.  Neurological:     General: No focal deficit present.     Mental Status: He is alert and oriented to person, place, and time.  Psychiatric:        Mood and Affect: Mood normal.        Behavior: Behavior normal.    Ortho Exam  Imaging: No results found.  Past Medical/Family/Surgical/Social History: Medications & Allergies reviewed per EMR, new medications updated. Patient Active Problem List   Diagnosis Date Noted   Hyperglycemia 03/04/2021   Urethral stricture 01/21/2021   S/P CABG x 4 01/19/2021   Other forms of angina pectoris (HCC) 12/24/2020   Hypertension 12/08/2020   Paroxysmal supraventricular tachycardia (HCC) 11/26/2020   Polycythemia 06/22/2020   Chronic arthralgias of knees and hips 05/19/2020   Myalgia due to statin 05/19/2020   Bursitis of left shoulder 11/28/2019   ED (erectile dysfunction) 10/22/2019   Sciatica without back pain, left 10/22/2019   History of total left hip replacement 10/10/2019   Primary insomnia 07/05/2019   Anxiety    Allergic rhinitis 05/16/2019   CAD (coronary artery disease) 01/18/2019   H/O heart artery stent 01/18/2019   Sigmoid stricture (HCC) 04/13/2018   Osteoarthritis of left hip 11/10/2015   Status post left hip replacement 11/10/2015   Diverticulosis 09/24/2014   Prostate cancer (HCC) 09/24/2014   Chronic back pain 09/24/2014   Dyspnea 06/06/2014   Murmur 06/06/2014   Past Medical History:  Diagnosis Date   Arthritis    Coronary artery disease    s/p prior stenting to RCA. Last cath in 05/2019 showed in-stent CTO of RCA and CTO of LCX (medical therpay recommended)   Diverticulosis    Dyspnea    Hard of hearing    Hyperlipidemia     Hypertension    hx of elevation on lyrica , off med and now normal    Paroxysmal SVT (supraventricular tachycardia) (HCC)    Prostate cancer (HCC)    Wears glasses    Family History  Problem Relation Age of Onset   Alzheimer's disease Mother    Stroke Father    Diabetes Sister    Heart attack Maternal Grandmother    Cancer Maternal Uncle        prostate   Prostate cancer Maternal Uncle    Cancer Maternal Uncle        prostate   Prostate cancer Maternal  Uncle    Colon polyps Brother    Colon cancer Neg Hx    Esophageal cancer Neg Hx    Rectal cancer Neg Hx    Stomach cancer Neg Hx    Past Surgical History:  Procedure Laterality Date   CARDIAC CATHETERIZATION     with 2 stents    COLECTOMY  05/2018   COLONOSCOPY     CORONARY ARTERY BYPASS GRAFT N/A 01/19/2021   Procedure: CORONARY ARTERY BYPASS GRAFTING (CABG), ON PUMP TIMES FOUR, USING LEFT INTERNAL MAMMARY ARTERY AND ENDOSOCPICALLY HARVESTED RIGHT GREATER SAPHENOUS VEIN;  Surgeon: Hilarie Lovely, MD;  Location: [facility] OR;  Service: Open Heart Surgery;  Laterality: N/A;   CYSTOSCOPY WITH URETHRAL DILATATION N/A 01/14/2021   Procedure: CYSTOSCOPY WITH BALLOON  DILATATION;  Surgeon: Erman Hayward, MD;  Location: [facility];  Service: Urology;  Laterality: N/A;   EYE SURGERY  02/2020   bilaterl cataracts   IR THORACENTESIS ASP PLEURAL SPACE W/IMG GUIDE  02/02/2021   LEFT HEART CATH AND CORONARY ANGIOGRAPHY N/A 12/24/2020   Procedure: LEFT HEART CATH AND CORONARY ANGIOGRAPHY;  Surgeon: Swaziland, Collier M, MD;  Location: [facility] INVASIVE CV LAB;  Service: Cardiovascular;  Laterality: N/A;   PROSTATECTOMY     TEE WITHOUT CARDIOVERSION N/A 01/19/2021   Procedure: TRANSESOPHAGEAL ECHOCARDIOGRAM (TEE);  Surgeon: Hilarie Lovely, MD;  Location: [facility] OR;  Service: Open Heart Surgery;  Laterality: N/A;   TOTAL HIP ARTHROPLASTY Left 11/10/2015   Procedure: LEFT TOTAL HIP ARTHROPLASTY ANTERIOR APPROACH;  Surgeon: Arnie Lao, MD;   Location: [facility] OR;  Service: Orthopedics;  Laterality: Left;   Social History   Occupational History   Occupation: Employed at [employer]  Tobacco Use   Smoking status: Never   Smokeless tobacco: Never  Vaping Use   Vaping Use: Never used  Substance and Sexual Activity   Alcohol use: Not Currently    Alcohol/week: 0.0 standard drinks    Comment: rarely   Drug use: No   Sexual activity: Yes    Partners: Female    Birth control/protection: None

## 2021-05-31 ENCOUNTER — Telehealth: Payer: Self-pay | Admitting: Physical Medicine and Rehabilitation

## 2021-05-31 ENCOUNTER — Other Ambulatory Visit: Payer: Self-pay

## 2021-05-31 ENCOUNTER — Encounter: Payer: Self-pay | Admitting: Cardiovascular Disease

## 2021-05-31 ENCOUNTER — Encounter: Payer: Self-pay | Admitting: Physical Medicine and Rehabilitation

## 2021-05-31 ENCOUNTER — Ambulatory Visit (INDEPENDENT_AMBULATORY_CARE_PROVIDER_SITE_OTHER): Payer: Managed Care, Other (non HMO) | Admitting: Cardiovascular Disease

## 2021-05-31 ENCOUNTER — Encounter: Payer: Self-pay | Admitting: *Deleted

## 2021-05-31 DIAGNOSIS — M791 Myalgia, unspecified site: Secondary | ICD-10-CM | POA: Diagnosis not present

## 2021-05-31 DIAGNOSIS — D751 Secondary polycythemia: Secondary | ICD-10-CM

## 2021-05-31 DIAGNOSIS — E785 Hyperlipidemia, unspecified: Secondary | ICD-10-CM | POA: Diagnosis not present

## 2021-05-31 DIAGNOSIS — I2510 Atherosclerotic heart disease of native coronary artery without angina pectoris: Secondary | ICD-10-CM | POA: Diagnosis not present

## 2021-05-31 DIAGNOSIS — I10 Essential (primary) hypertension: Secondary | ICD-10-CM | POA: Diagnosis not present

## 2021-05-31 DIAGNOSIS — Z951 Presence of aortocoronary bypass graft: Secondary | ICD-10-CM | POA: Diagnosis not present

## 2021-05-31 DIAGNOSIS — Z5181 Encounter for therapeutic drug level monitoring: Secondary | ICD-10-CM | POA: Diagnosis not present

## 2021-05-31 DIAGNOSIS — T466X5D Adverse effect of antihyperlipidemic and antiarteriosclerotic drugs, subsequent encounter: Secondary | ICD-10-CM

## 2021-05-31 DIAGNOSIS — Z79899 Other long term (current) drug therapy: Secondary | ICD-10-CM | POA: Diagnosis not present

## 2021-05-31 DIAGNOSIS — T466X5A Adverse effect of antihyperlipidemic and antiarteriosclerotic drugs, initial encounter: Secondary | ICD-10-CM

## 2021-05-31 MED ORDER — AMIODARONE HCL 100 MG PO TABS: ORAL_TABLET | ORAL | 0 refills | Status: DC

## 2021-05-31 NOTE — Patient Instructions (Signed)
Medication Instructions:  ?AMIODARONE 100 mg:  ?Take one tablet once daily for three weeks, ?Take one tablet every other day for three weeks, ?Then discontinue ? ?If your heart rate goes up after stopping the Amiodarone, then please call the office at [phone]. We may go up on the Metoprolol  ? ?*If you need a refill on your cardiac medications before your next appointment, please call your pharmacy* ? ? ?Lab Work: ?Your provider would like for you to return in May to have the following labs drawn: CBC, CMET, fasting Lipid and TSH. You do not need an appointment for the lab. Once in our office lobby there is a podium where you can sign in and ring the doorbell to alert [place] that you are here. The lab is open from 8:00 am to 4 pm; closed for lunch from 12:45pm-1:45pm. ? ?You may also go to any of these [facility] locations: ?  ?[place] ?- [place] ([facility]) ?- [address] ?- [place]  ?[place] ?- [address]  ?  ?[place]  ?- [place] [address]  ?  ?[place] ?- [place]  ?[place]  ?- [place] ?- [place] [address] ([facility] ? ?If you have labs (blood work) drawn today and your tests are completely normal, you will receive your results only by: ?MyChart Message (if you have MyChart) OR ?A paper copy in the mail ?If you have any lab test that is abnormal or we need to change your treatment, we will call you to review the results. ? ? ?Testing/Procedures: ?None ordered ? ? ?Follow-Up: ?At [facility], you and your health needs are our priority.  As part of our continuing mission to provide you with exceptional heart care, we have created designated Provider Care Teams.  These Care Teams include your primary Cardiologist (physician) and Advanced Practice Providers (APPs -  Physician Assistants and Nurse Practitioners) who all work together to provide  you with the care you need, when you need it. ? ?We recommend signing up for the patient portal called "MyChart".  Sign up information is provided on this After Visit Summary.  MyChart is used to connect with patients for Virtual Visits (Telemedicine).  Patients are able to view lab/test results, encounter notes, upcoming appointments, etc.  Non-urgent messages can be sent to your provider as well.   ?To learn more about what you can do with MyChart, go to [email or web address].   ? ?Your next appointment:   ?5 month(s) ? ?The format for your next appointment:   ?In Person ? ?Provider:   ?[name], MD   ? ? ?Other Instructions ?You may hold the Plavix and Aspirin for 5 days prior to the surgery ? ?

## 2021-05-31 NOTE — Progress Notes (Signed)
 Cardiology Office Note    Date:  05/31/2021   ID:  Jacob Collier, DOB [birth year], MRN [id number]  PCP:  Audie Bleacher, MD  Cardiologist:  Magnus Schuller, MD    4 month F/U s/p CABG  History of Present Illness:  Jacob Collier is a 66 y.o. male who is a former patient of Dr. Maximo Spar.  He last saw Dr. Maximo Spar in 2018.  He has known CAD since 2018 has been followed by cardiology in [facility].  He is status post PCI to the proximal to mid RCA at catheterization on January 04, 2019 and had known CTO of his circumflex.  He had undergone repeat catheterization in March 2021 showing in-stent restenosis of his RCA stent which progressed to chronic total occlusion as well as chronic total occlusion of his circumflex.  His LAD was patent.  He had been seen by his cardiologist when he presented to [facility] on June 07, 2019 requesting a second opinion.  At that time I saw him in the hospital and ultimately was able to obtain the angiographic studies that were done at [facility] for his most recent catheterization of May 25, 2019.  When that time I reviewed the angiographic films with Dr. Swaziland who did not feel his CTO of his RCA was amenable to intervention due to lack of any beak large occlusion at the site of a small branch.  Complex was not amenable to intervention.  He did have some concomitant CAD.  After much discussion I reviewed the data with the patient and felt that medical therapy was the best option.  There was some concern in the past and he did not benefit from initial dosing of ranolazine.  He was discharged the following day and increased medical regimen consisting of isosorbide  30 mg, amlodipine , aspirin  and Plavix  in addition to his previous lisinopril .  He was bradycardic in the 50s.  He also was treated with high potency statin therapy and atorvastatin  was increased to 80 mg.  He presented to the emergency room on June 11, 2019 with palpitations and neck tightness.  EMS  reported a narrow complex tachycardia at a rate of 160 bpm.  Upon arrival to the emergency room he was in sinus rhythm with PVCs and bigeminy.  He was suspected to have had very brief episode of SVT and the plan was to start Toprol -XL 25 mg daily.  I saw him after his ER evaluation in June 25, 2019.  He had  decided to continue his cardiology care with me since his hospitalization.  He has felt improved and denies any significant increasing chest pain symptoms or recurrent episodes of SVT.[name]  He has noticed development of myalgias since his increased atorvastatin  dose.  He also has noticed a mild cough on lisinopril .  During that evaluation, in light of his cough sensitization I recommended switching to losartan  who started him on 25 mg daily.  Due to issues with atorvastatin  causing myalgias I recommended he reduce his dose to 20 mg and also recommended initiation of Zetia  10 mg.  We discussed if he continued to have increasing anginal symptomatology.  Repeat challenge with Ranexa could be undertaken.  He was subsequently evaluated by Sharren Decree, PA-C in June 2021.  He had a cardiac monitor which showed predominant sinus rhythm at 65 bpm.  The slowest heart rate was sinus bradycardia at 42 bpm the fastest heart rate was sinus tachycardia 164 bpm.  He had occasional PVCs with  a ventricular couplet and several episodes of short-lived ventricular bigeminal rhythm.  There was a 4 beat episode of nonsustained VT, 92 bpm.  He also had another episode of SVT lasting 36 seconds.  I saw him in September 2021.  Since he has been on a beta-blocker therapy, he denied any recurrent tachycardia dysrhythmias.  He was on a regimen consisting of amlodipine  2.5 mg, isosorbide  30 mg but he is taking this 50 mg twice a day, losartan  25 mg, Toprol -XL 25 mg daily.  He continues to be on DAPT with aspirin /Plavix .  He is tolerating atorvastatin  20 mg and Zetia  10 mg for his hyperlipidemia.  He has noticed some episodes of mild  chest discomfort.  He denied recurrent SVT and is asked he feels this was anxiety mediated and he had been placed on low-dose Klonopin  by his primary provider.   I saw him on April 01, 2020.  His palpitations are less since he has been taking Klonopin .  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  He has had urinary issues and apparently has dark urine.  He is being evaluated by Dr. Manuel Sell of urology and may require bladder sphincter surgery.  He has undergone urinalysis.  He was told perhaps that some of his dark urine may read present a metabolic issue.  He has a history of prostatectomy and radiation treatment.  He admits to significant myalgias and has been on atorvastatin  20 mg and Zetia  10 mg for hyperlipidemia.  During that evaluation, I recommended he discontinue atorvastatin  and try rosuvastatin  10 mg every other day along with Zetia  and attempt to achieve a target LDL goal less than 70.  He was on aspirin /Plavix  for DAPT and amlodipine  5 mg, isosorbide  15 mg twice a day, metoprolol  succinate 25 mg and losartan  12.5 mg for hypertension/CAD.  He denied any recent anginal symptoms.    When I saw him on June 12, 2020  he had discontinued rosuvastatin .  He works all day standing on hard concrete and he felt that both atorvastatin  and rosuvastatin  were contributing to some myalgias.  ESR on April 01, 2020 was normal at 9. He denied any anginal symptoms.  At times he does note some mild shortness of breath with activity.  He was unaware of palpitations.  During that evaluation, I recommended a trial of Livalo  which oftentimes can be tolerated in patients with otherwise statin intolerance.  With his known CAD with CTO's of his circumflex and RCA I recommended slight titration of isosorbide  recommended he take 30 mg in the morning and 60 mg at night and slightly titrated metoprolol  to 37.5 mg twice a day.  I recommended he undergo a follow-up echo Doppler evaluation.  I saw him on August 13, 2020.   He underwent a follow-up echo Doppler study on Jul 22, 2020 which showed an EF of 50 to 55%, grade 2 diastolic dysfunction and mild eccentric LVH of the septum.  There was hypokinesis involving the anterolateral posterior and basal inferior wall.  There was mild ascending aortic dilatation at 41 mm mild left atrial dilatation.  He states that he developed an episode of angina on Aug 01, 2019 which led to ER evaluation on May 30.  Troponins were negative.  He has had issues with increased anxiety and panic attacks and had called his primary physician since he felt he needed a new prescription for his clonazepam .  He states he did not tolerate Livalo .  However his brother also had intolerance to  many statins has been able to tolerate pravastatin  80 and he suggested perhaps a trial of this may be beneficial.  Of note, recent laboratory from his evaluation revealed progressive increase in hemoglobin now at 19.3 with hematocrit of 57.3.  Platelets 197,000.  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  He denied recurrent chest pain since his angina at the end of May.  During that evaluation, I recommended he see hematology for evaluation of his polycythemia and was evaluated by Dr. Amparo Balk of hematology/oncology.  It was felt that his polycythemia most likely was secondary to either obstructive sleep apnea or exogenous testosterone  injections and laboratory was ordered.  Jacob Collier was seen by Sharren Decree in the office on December 09, 2020.  With complaints of increasing chest pain and palpitations definitive cardiac catheterization was recommended.  He was scheduled to undergo cardiac catheterization with me on December 24, 2020 but due to my family emergency, I was out of town and the procedure was done by Dr. Swaziland.  Catheterization revealed severe three-vessel CAD with 75% mid LAD stenosis,  80% first diagonal stenosis, previously noted total occlusion of the mid circumflex and proximal RCA.  Compared to his prior cath of March 2021, the LAD stenosis was new.  There was mild LV dysfunction with inferobasal hypokinesis with EF of 50%.   I saw him in follow-up of his cardiac catheterization on January 07, 2021.  He had been seen by  Dr. Deloise Ferries of cardiothoracic surgery on January 01, 2021 and was tentatively scheduled to undergo CABG revascularization surgery on January 19, 2021.  However, the patient has had issues with urethral stricture and is in need to undergo cystoscopy with balloon dilatation by Dr. Erman Hayward on January 14, 2021.  He has had intolerance to statin therapy and is now on Zetia  with plans to initiate Repatha  following his CABG.  Presently he is on amlodipine  5 mg, isosorbide  30 mg in the morning and 50 mg in the evening in addition to metoprolol  25 mg.  He is on DAPT with aspirin /Plavix .  During that evaluation, he was advised on the need to hold his Plavix  for his urologic procedure and stay off Plavix  for his CABG revascularization scheduled on January 19, 2021.  Jacob Collier underwent CABG x4 revascularization surgery successfully on November 15 with LIMA to LAD, SVG to OM1, SVG to first diagonal, and SVG to PDA.  He developed atrial fibrillation and was treated with amiodarone . Subsequently, he developed significant pleural effusion and following discharge underwent thoracentesis on January 26, 2021 with removal of 2 L of fluid.  He was subsequently evaluated by Dr. Deloise Ferries on February 05, 2021 and was stable from his perspective.  I last saw him on February 18, 2021 at which time he was breathing much better.  He denied any anginal symptoms.  There was resolution of his prior lower extremity edema.  He continued to be on amiodarone  200 mg daily and is unaware of any recurrent A. Fib and continued on amlodipine  5 mg, metoprolol  tartrate 12.5 mg twice a day,  furosemide  40 mg daily.  He is on Zetia  10 mg.  During that evaluation, with his statin intolerance, I recommended PCSK9 inhibition to achieve target LDL less than 55.  Jacob Collier was seen by Sunny English, [facility] on March 31, 2021 and received his first Repatha  injection.  To date  he has had a total of 3 injections and is tolerating them well.  He denies any rash.  He denies any chest pain.  His breathing is significantly improved.  There is no significant edema.  3 days ago he had taken his last dose of an amiodarone  and has a prescription waiting for him at the pharmacy which she has not yet picked up.  He will be undergoing neck injections at C5-6 in the upcoming future for his chronic neck disease.  He presents for evaluation.  Past Medical History:  Diagnosis Date   Arthritis    Coronary artery disease    s/p prior stenting to RCA. Last cath in 05/2019 showed in-stent CTO of RCA and CTO of LCX (medical therpay recommended)   Diverticulosis    Dyspnea    Hard of hearing    Hyperlipidemia    Hypertension    hx of elevation on lyrica , off med and now normal    Paroxysmal SVT (supraventricular tachycardia) (HCC)    Prostate cancer (HCC)    Wears glasses     Past Surgical History:  Procedure Laterality Date   CARDIAC CATHETERIZATION     with 2 stents    COLECTOMY  05/2018   COLONOSCOPY     CORONARY ARTERY BYPASS GRAFT N/A 01/19/2021   Procedure: CORONARY ARTERY BYPASS GRAFTING (CABG), ON PUMP TIMES FOUR, USING LEFT INTERNAL MAMMARY ARTERY AND ENDOSOCPICALLY HARVESTED RIGHT GREATER SAPHENOUS VEIN;  Surgeon: Hilarie Lovely, MD;  Location: [facility] OR;  Service: Open Heart Surgery;  Laterality: N/A;   CYSTOSCOPY WITH URETHRAL DILATATION N/A 01/14/2021   Procedure: CYSTOSCOPY WITH BALLOON  DILATATION;  Surgeon: Erman Hayward, MD;  Location: [facility] ORS;  Service: Urology;  Laterality: N/A;   EYE SURGERY  02/2020   bilaterl cataracts   IR THORACENTESIS ASP PLEURAL SPACE W/IMG GUIDE   02/02/2021   LEFT HEART CATH AND CORONARY ANGIOGRAPHY N/A 12/24/2020   Procedure: LEFT HEART CATH AND CORONARY ANGIOGRAPHY;  Surgeon: Swaziland, Peter M, MD;  Location: [facility] INVASIVE CV LAB;  Service: Cardiovascular;  Laterality: N/A;   PROSTATECTOMY     TEE WITHOUT CARDIOVERSION N/A 01/19/2021   Procedure: TRANSESOPHAGEAL ECHOCARDIOGRAM (TEE);  Surgeon: Hilarie Lovely, MD;  Location: [facility] OR;  Service: Open Heart Surgery;  Laterality: N/A;   TOTAL HIP ARTHROPLASTY Left 11/10/2015   Procedure: LEFT TOTAL HIP ARTHROPLASTY ANTERIOR APPROACH;  Surgeon: Arnie Lao, MD;  Location: [facility] OR;  Service: Orthopedics;  Laterality: Left;    Current Medications: Outpatient Medications Prior to Visit  Medication Sig Dispense Refill   acetaminophen  (TYLENOL ) 500 MG tablet Take 1,000 mg by mouth every 8 (eight) hours as needed for mild pain.     amLODipine  (NORVASC ) 5 MG tablet Take 1 tablet (5 mg total) by mouth daily. 30 tablet 2   aspirin  EC 81 MG tablet Take 81 mg by mouth daily. Swallow whole.     clonazePAM  (KLONOPIN ) 1 MG tablet Take 1 tablet (1 mg total) by mouth 2 (two) times daily as needed for anxiety. 60 tablet 3   clopidogrel  (PLAVIX ) 75 MG tablet Take 1 tablet (75 mg total) by mouth daily. Stop taking plavix  on Saturday Nov. 5, 2022 90 tablet 1   Eszopiclone  3 MG TABS TAKE 1 TABLET BY MOUTH EVERYDAY AT BEDTIME 30 tablet 2   Evolocumab  (REPATHA  SURECLICK) 140 MG/ML SOAJ Inject 140 mg into the skin every 14 (fourteen) days. 2 mL 11   ezetimibe  (ZETIA ) 10 MG tablet TAKE 1 TABLET BY MOUTH  EVERY DAY 90 tablet 3   metoprolol  succinate (TOPROL -XL) 25 MG 24 hr tablet Take 0.5 tablets (12.5 mg total) by mouth daily. Take with or immediately following a meal. (Patient taking differently: Take 25 mg by mouth daily. Take with or immediately following a meal.) 45 tablet 2   nitroGLYCERIN  (NITROSTAT ) 0.4 MG SL tablet Place 1 tablet (0.4 mg total) under the tongue every 5 (five) minutes as needed for  chest pain. 25 tablet 2   Omega-3 Fatty Acids (FISH OIL) 1200 MG CAPS Take 2,400 mg by mouth 2 (two) times daily.     amiodarone  (PACERONE ) 200 MG tablet Take 1 tablet (200 mg total) by mouth 2 (two) times daily. X 7 days, then decrease to 200 mg daily (Patient taking differently: Take 200 mg by mouth daily. Take 1 tablet by mouth daily) 60 tablet 1   cyclobenzaprine  (FLEXERIL ) 10 MG tablet Take 1 tablet (10 mg total) by mouth 3 (three) times daily as needed for muscle spasms. (Patient not taking: Reported on 05/31/2021) 30 tablet 0   Lidocaine  (HM LIDOCAINE  PATCH) 4 % PTCH Apply 1 patch topically 3 (three) times daily. (Patient not taking: Reported on 05/31/2021) 20 patch 3   No facility-administered medications prior to visit.     Allergies:   Ranolazine, Crestor  [rosuvastatin ], Lipitor  [atorvastatin ], Pravastatin , and Statins   Social History   Socioeconomic History   Marital status: Married    Spouse name: Not on file   Number of children: 0   Years of education: Not on file   Highest education level: Not on file  Occupational History   Occupation: Employed at [employer]  Tobacco Use   Smoking status: Never   Smokeless tobacco: Never  Vaping Use   Vaping Use: Never used  Substance and Sexual Activity   Alcohol use: Not Currently    Alcohol/week: 0.0 standard drinks    Comment: rarely   Drug use: No   Sexual activity: Yes    Partners: Female    Birth control/protection: None  Other Topics Concern   Not on file  Social History Narrative   Not on file   Social Determinants of Health   Financial Resource Strain: Not on file  Food Insecurity: Not on file  Transportation Needs: Not on file  Physical Activity: Not on file  Stress: Not on file  Social Connections: Not on file     Family History:  The patient's family history includes Alzheimer's disease in his mother; Cancer in his maternal uncle and maternal uncle; Colon polyps in his brother; Diabetes in his sister;  Heart attack in his maternal grandmother; Prostate cancer in his maternal uncle and maternal uncle; Stroke in his father.   ROS General: Negative; No fevers, chills, or night sweats;  HEENT: Negative; No changes in vision or hearing, sinus congestion, difficulty swallowing Pulmonary: Thoracentesis following CABG surgery with removal of 2 L of fluid Cardiovascular: See HPI GI: Negative; No nausea, vomiting, diarrhea, or abdominal pain GU: h/o prostatectomy, radiation treatment.  Need for bladder sphincter surgery.  Complains of dark urine. Musculoskeletal: Myalgias; to have C5-6 neck injection Hematologic/Oncology: Polycythemia, presumed to be secondary Endocrine: Negative; no heat/cold intolerance; no diabetes Neuro: Negative; no changes in balance, headaches Skin: Negative; No rashes or skin lesions Psychiatric: History of anxiety and panic attacks Sleep: Negative; No snoring, daytime sleepiness, hypersomnolence, bruxism, restless legs, hypnogognic hallucinations, no cataplexy Other comprehensive 14 point system review is negative.   PHYSICAL EXAM:   VS:  BP 128/80  Pulse (!) 58   Ht 5\' 11"  (1.803 m)   Wt 223 lb 6.4 oz (101.3 kg)   SpO2 99%   BMI 31.16 kg/m     Repeat blood pressure by me was 126/78  Wt Readings from Last 3 Encounters:  05/31/21 223 lb 6.4 oz (101.3 kg)  05/18/21 219 lb (99.3 kg)  04/05/21 210 lb (95.3 kg)    General: Alert, oriented, no distress.  Skin: normal turgor, no rashes, warm and dry HEENT: Normocephalic, atraumatic. Pupils equal round and reactive to light; sclera anicteric; extraocular muscles intact;  Nose without nasal septal hypertrophy Mouth/Parynx benign; Mallinpatti scale 3 Neck: No JVD, no carotid bruits; normal carotid upstroke Lungs: clear to ausculatation and percussion; no wheezing or rales Chest wall: without tenderness to palpitation Heart: PMI not displaced, RRR, s1 s2 normal, 1/6 systolic murmur, no diastolic murmur, no rubs,  gallops, thrills, or heaves Abdomen: soft, nontender; no hepatosplenomehaly, BS+; abdominal aorta nontender and not dilated by palpation. Back: no CVA tenderness Pulses 2+ Musculoskeletal: full range of motion, normal strength, no joint deformities Extremities: no clubbing cyanosis or edema, Homan's sign negative  Neurologic: grossly nonfocal; Cranial nerves grossly wnl Psychologic: Normal mood and affect    Studies/Labs Reviewed:   May 31, 2021 ECG (independently read by me):  Sinus bradycardia at 58, QTc 473 msec  February 18, 2021 ECG (independently read by me):  Sinus bradycardia at 48, PAC, QTc 493 msec  January 07, 2021 ECG (independently read by me):  Sinus bradycardia at 59; small inferior Q waves, no ST changes  August 13, 2020 ECG (independently read by me): sinus rhythm at 65; PAC, QTc 480 msec  June 12, 2020 ECG (independently read by me): NSR at 62; NSST changes  January 2022 ECG (independently read by me): NSR at 62; LVH, small inferior Q waves  September 2021 ECG (independently read by me):Normal sinus rhythm at 61 bpm.  Normal intervals.  No ectopy.  April 2021 ECG (independently read by me): Sinus bradycardia 54 bpm.  Small Q-wave in lead III.  No ectopy.  Nonspecific ST changes.  Recent Labs:    Latest Ref Rng & Units 03/17/2021    2:17 PM 03/04/2021    8:55 AM 01/24/2021    1:25 AM  BMP  Glucose 70 - 99 mg/dL 045   89   409    BUN 8 - 23 mg/dL 24   19   18     Creatinine 0.61 - 1.24 mg/dL 8.11   9.14   7.82    BUN/Creat Ratio 10 - 24  18     Sodium 135 - 145 mmol/L 137   141   135    Potassium 3.5 - 5.1 mmol/L 3.8   4.7   3.7    Chloride 98 - 111 mmol/L 108   104   106    CO2 22 - 32 mmol/L 21   20   22     Calcium  8.9 - 10.3 mg/dL 8.7   9.7   8.0          Latest Ref Rng & Units 03/17/2021    2:17 PM 03/04/2021    8:55 AM 01/23/2021   10:28 AM  Hepatic Function  Total Protein 6.5 - 8.1 g/dL 6.7   7.0   5.8    Albumin  3.5 - 5.0 g/dL 3.8   4.2   2.9     AST 15 - 41 U/L 17   20   19  ALT 0 - 44 U/L 30   37   18    Alk Phosphatase 38 - 126 U/L 63   131   50    Total Bilirubin 0.3 - 1.2 mg/dL 0.5   0.3   0.9         Latest Ref Rng & Units 03/17/2021    2:17 PM 03/04/2021    8:55 AM 01/22/2021    2:31 AM  CBC  WBC 4.0 - 10.5 K/uL 9.3   7.0   8.6    Hemoglobin 13.0 - 17.0 g/dL 16.1   09.6   04.5    Hematocrit 39.0 - 52.0 % 44.5   48.7   34.1    Platelets 150 - 400 K/uL 170   216   125     Lab Results  Component Value Date   MCV 88.8 03/17/2021   MCV 89 03/04/2021   MCV 89.0 01/22/2021   No results found for: TSH Lab Results  Component Value Date   HGBA1C 5.8 (H) 03/04/2021     BNP    Component Value Date/Time   BNP 67.2 06/05/2019 2231    ProBNP No results found for: PROBNP   Lipid Panel     Component Value Date/Time   CHOL 209 (H) 03/04/2021 0855   TRIG 263 (H) 03/04/2021 0855   HDL 34 (L) 03/04/2021 0855   CHOLHDL 6.1 (H) 03/04/2021 0855   CHOLHDL 4.8 06/06/2019 0612   VLDL 29 06/06/2019 0612   LDLCALC 128 (H) 03/04/2021 0855   LABVLDL 47 (H) 03/04/2021 0855     RADIOLOGY: MR Cervical Spine w/o contrast  Result Date: 05/10/2021 CLINICAL DATA:  Chronic neck pain for 3-4 years related to a power lifting injury. EXAM: MRI CERVICAL SPINE WITHOUT CONTRAST TECHNIQUE: Multiplanar, multisequence MR imaging of the cervical spine was performed. No intravenous contrast was administered. COMPARISON:  X-ray cervical 04/05/2021. CT cervical spine November 30, 2011. FINDINGS: Alignment: 3 mm anterolisthesis of C4 over C5 and 4 mm anterolisthesis of C7 over T1. Small retrolisthesis of C5 over C6. Vertebrae: No fracture, evidence of discitis, or bone lesion. Endplate degenerative changes at C5-6 and C6-7. Cord: Mass effect on the cord at C5-6 and C6-7 with increase of the T2 signal within the cord at C5-6. Posterior Fossa, vertebral arteries, paraspinal tissues: Negative. Disc levels: C2-3: Facet degenerative changes. No  spinal canal or neural foraminal stenosis. C3-4: Posterior disc osteophyte complex resulting in mild spinal canal stenosis. Uncovertebral and facet degenerative changes with prominent hypertrophy of the right facet joint resulting in severe right neural foraminal narrowing. C4-5: Posterior disc osteophyte complex resulting in mild spinal canal stenosis. Uncovertebral and facet degenerative changes. Prominent hypertrophy of the right facet joint resulting in severe bilateral neural foraminal narrowing. C5-6: Posterior disc osteophyte complex resulting in moderate to severe spinal canal stenosis with mass effect on the cord and increased T2 signal within the cord suggesting cord edema versus myelomalacia. Uncovertebral and facet degenerative changes resulting in severe bilateral neural foraminal narrowing. C6-7: Posterior disc osteophyte complex resulting in moderate spinal canal stenosis with mild mass effect on the cord. Uncovertebral and facet degenerative changes resulting in severe bilateral neural foraminal narrowing. C7-T1: Anterolisthesis, disc bulge/disc uncovering and hypertrophic facet degenerative changes resulting in moderate bilateral neural foraminal narrowing. No significant spinal canal stenosis. IMPRESSION: 1. Advanced degenerative changes of the cervical spine, worst at C5-6 where there is moderate to severe spinal canal stenosis with mass effect on the cord and increased cord signal  on T2, suggesting cord edema versus myelomalacia. 2. Moderate spinal canal stenosis at C6-7. 3. Severe bilateral neural foraminal narrowing at C4-5, C5-6 and C6-7 and on the right at C3-4. 4. Moderate bilateral neural foraminal narrowing at C7-T1. Electronically Signed   By: Katyucia  de Macedo Rodrigues M.D.   On: 05/10/2021 09:52   XR Finger Thumb Left  Result Date: 05/25/2021 Graphs of his left thumb were reviewed today.  Overall well-maintained alignment.  No acute fractures or injuries.  He does have  bone-on-bone arthritis of the joint of the trapezium and the navicular.    Additional studies/ records that were reviewed today include:  I reviewed the patient's most recent hospitalization, and while in the hospital was able to review his outside angiographic films.    Prox LAD to Mid LAD lesion is 75% stenosed.   1st Diag lesion is 80% stenosed.   Prox Cx to Mid Cx lesion is 100% stenosed.   Prox RCA to Mid RCA lesion is 100% stenosed.   The left ventricular systolic function is normal.   LV end diastolic pressure is mildly elevated.   The left ventricular ejection fraction is 50-55% by visual estimate.   There is no aortic valve stenosis.   3 vessel obstructive CAD. 75% mid LAD, 80% first diagonal, 100% mid LCx and 100% proximal RCA. Compared to prior cath in March 2021 the LAD stenosis is new.  Mild LV dysfunction with inferobasal HK. EF 50% Mildly elevated LVEDP   Plan: needs CT surgery evaluation for CABG. Will hold Plavix . Plan to refer to Pharm D for PCSK 9 inhibitor.      ASSESSMENT:    1. Coronary artery disease involving native coronary artery of native heart without angina pectoris   2. Essential hypertension   3. S/P CABG x 4   4. Hyperlipidemia LDL goal <70   5. Polycythemia   6. Myalgia due to statin   7. Encounter for monitoring amiodarone  therapy     PLAN:  Jishnu Jenniges is a 66 year-old gentleman who has known CAD and underwent initial PCI to his RCA in October 2020 at which time he also had known CTO of his circumflex.  He has a history of hypertension and hyperlipidemia and subsequently developed CTO of his RCA which upon angiographic review was not amenable for attempt at intervention.  He has been documented to have SVT which has improved with initiation of metoprolol  succinate.  He believes his episodes have been anxiety mediated and since he had been placed on low-dose Klonopin  he is not experiencing any tachydysrhythmia.  When I saw him in April 2022 he  was not having recurrent anginal symptomatology but had experienced  some increase in shortness of breath when very active at work.  In the past, there is some issue regarding Ranexa intolerance.  He experienced myalgias with atorvastatin  and on a trial of every other day rosuvastatin  he also felt similar symptoms and discontinued therapy.  Most recently he is only been on Zetia .  In addition, he subsequently had a trial of Livalo  and also pravastatin .  Due to statin intolerance he will need initiation of PSK 9 inhibition which will be set up following his CABG revascularization.  He underwent successful CABG revascularization in November 48 teen 2022 by Dr. Deloise Ferries received a LIMA to LAD, SVG to PDA, SVG to diagonal and SVG to OM vessel.  He developed postoperative atrial fibrillation for which he was treated with amiodarone  at 200 mg mg twice a  day for 1 week and subsequently 200 mg daily.  He had undergone successful thoracentesis with removal of 2 L of pleural fluid following his CABG revascularization.  Presently there is resolution of his prior edema.  His blood pressure today is stable.  I have recommended that he resume amiodarone  but at a reduced dose of 100 mg daily in the next 3 weeks and then every other day for 3 weeks with discontinuance thereafter.  If he notes significant increased heart rate upon discontinuing his metoprolol  dose may need to be increased from 12.5 to 25 mg but I will not do this presently.  He has now initiated Repatha  injections for his statin intolerance and continues to be on Zetia .  He has received 3 doses of Repatha .  Target LDL is less than 55.  After 3 months of injections he will undergo repeat laboratory with a comprehensive metabolic panel, lipid panel, CBC and TSH.  He has significant stenosis at C5-6 and will require neck injection.  I instructed him that he should hold aspirin  and Plavix  for minimum of 5 to 7 days prior to the procedure.  He has not appointment to see  Dr. Amparo Balk of hematology/oncology in follow-up of his prior polycythemia. I will see him in May for follow-up evaluation or sooner as needed.     Medication Adjustments/Labs and Tests Ordered: Current medicines are reviewed at length with the patient today.  Concerns regarding medicines are outlined above.  Medication changes, Labs and Tests ordered today are listed in the Patient Instructions below. Patient Instructions  Medication Instructions:  AMIODARONE  100 mg:  Take one tablet once daily for three weeks, Take one tablet every other day for three weeks, Then discontinue  If your heart rate goes up after stopping the Amiodarone , then please call the office at [phone]. We may go up on the Metoprolol    *If you need a refill on your cardiac medications before your next appointment, please call your pharmacy*   Lab Work: Your provider would like for you to return in May to have the following labs drawn: CBC, CMET, fasting Lipid and TSH. You do not need an appointment for the lab. Once in our office lobby there is a podium where you can sign in and ring the doorbell to alert us  that you are here. The lab is open from 8:00 am to 4 pm; closed for lunch from 12:45pm-1:45pm.  You may also go to any of these [facility] locations:   [facility] - [address] ([facility]) - [address] [id number] [address]   [place] - [address]    [place]  - [address]    [place] - [address] - [address] [employer]  - [address] [place] [address] ([facility]  If you have labs (blood work) drawn today and your tests are completely normal, you will receive your results only by: [employer] (if you have MyChart) OR A paper copy in the mail If you have any lab test that is abnormal or we need to change your treatment, we will call you to review the  results.   Testing/Procedures: None ordered   Follow-Up: At [facility], you and your health needs are our priority.  As part of our continuing mission to provide you with exceptional heart care, we have created designated Provider  Care Teams.  These Care Teams include your primary Cardiologist (physician) and Advanced Practice Providers (APPs -  Physician Assistants and Nurse Practitioners) who all work together to provide you with the care you need, when you need it.  We recommend signing up for the patient portal called "MyChart".  Sign up information is provided on this After Visit Summary.  MyChart is used to connect with patients for Virtual Visits (Telemedicine).  Patients are able to view lab/test results, encounter notes, upcoming appointments, etc.  Non-urgent messages can be sent to your provider as well.   To learn more about what you can do with MyChart, go to [email or web address].    Your next appointment:   5 month(s)  The format for your next appointment:   In Person  Provider:   Magnus Schuller, MD     Other Instructions You may hold the Plavix  and Aspirin  for 5 days prior to the surgery    Signed, Magnus Schuller, MD  05/31/2021 6:41 PM    [facility] [address], [address], [place], [place]  [address] Phone: [phone]  8.I did okay

## 2021-05-31 NOTE — Telephone Encounter (Signed)
Patient came by. Says he needs to be off his blood thinner medication 5 days before procedure. His call back number is [phone] ?

## 2021-06-01 DIAGNOSIS — M79674 Pain in right toe(s): Secondary | ICD-10-CM | POA: Diagnosis not present

## 2021-06-01 DIAGNOSIS — G5791 Unspecified mononeuropathy of right lower limb: Secondary | ICD-10-CM | POA: Insufficient documentation

## 2021-06-01 DIAGNOSIS — G8929 Other chronic pain: Secondary | ICD-10-CM | POA: Diagnosis not present

## 2021-06-06 ENCOUNTER — Other Ambulatory Visit: Payer: Self-pay | Admitting: Legal Medicine

## 2021-06-06 MED ORDER — AMOXICILLIN 875 MG PO TABS
875.0000 mg | ORAL_TABLET | Freq: Two times a day (BID) | ORAL | 0 refills | Status: AC
Start: 2021-06-06 — End: 2021-06-16

## 2021-06-07 ENCOUNTER — Encounter: Payer: Self-pay | Admitting: Legal Medicine

## 2021-06-07 ENCOUNTER — Ambulatory Visit (INDEPENDENT_AMBULATORY_CARE_PROVIDER_SITE_OTHER): Payer: Managed Care, Other (non HMO) | Admitting: Legal Medicine

## 2021-06-07 ENCOUNTER — Telehealth: Payer: Self-pay | Admitting: Cardiovascular Disease

## 2021-06-07 VITALS — BP 142/74 | HR 72 | Temp 98.4°F | Resp 18 | Wt 217.6 lb

## 2021-06-07 DIAGNOSIS — J180 Bronchopneumonia, unspecified organism: Secondary | ICD-10-CM

## 2021-06-07 NOTE — Telephone Encounter (Signed)
? ?  Pre-operative Risk Assessment  ?  ?Patient Name: Jacob Collier  ?DOB: 1955/07/26 ?MRN: [id number]  ? ?  ? ?Request for Surgical Clearance   ? ?Procedure:   Cervical interlamirar ? ?Date of Surgery:  Clearance TBD                              ?   ?Surgeon:  Dr. Nonnie Done  ?Surgeon's Group or Practice Name:  [facility] ?Phone number:  [phone] ?Fax number:  [phone] ?  ?Type of Clearance Requested:   ?- Pharmacy:  Hold Clopidogrel (Plavix) 7 days prior  ?  ?Type of Anesthesia:  None  ?  ?Additional requests/questions:   n/a ? ?Signed, ?Kamira J Martinique   ?06/07/2021, 9:24 AM  ? ?

## 2021-06-07 NOTE — Telephone Encounter (Signed)
? ? ?  Patient Name: Jacob Collier  ?DOB: 15-Oct-1955 ?MRN: [id number] ? ?Primary Cardiologist: [name], MD ? ?Chart reviewed as part of pre-operative protocol coverage. Given past medical history and time since last visit, based on ACC/AHA guidelines, Jacob Collier would be at acceptable risk for the planned procedure without further cardiovascular testing.  ? ?Patient has been seen by Dr. [name] recently who has already cleared the patient to hold aspirin and Plavix for 7 days prior to the upcoming neck injection. ? ?The patient was advised that if he develops new symptoms prior to surgery to contact our office to arrange for a follow-up visit, and he verbalized understanding. ? ?I will route this recommendation to the requesting party via Epic fax function and remove from pre-op pool. ? ?Please call with questions. ? ?Almyra Deforest, [place] ?06/07/2021, 12:43 PM ? ?

## 2021-06-07 NOTE — Progress Notes (Signed)
 Acute Office Visit  Subjective:    Patient ID: Jacob Collier, male    DOB: 1955/08/04, 66 y.o.   MRN: [id number]  Chief Complaint  Patient presents with   Nasal Congestion   Fever    Last night 102   Cough    HPI: Patient is in today for cough, congestion, fever, body aches, sore throat. Denies nausea/vomiting, diarrhea. Symptoms began Friday afternoon. Took nyquil Saturday night- without relief. Amoxicillin , cough medicine. Temp 102.  Past Medical History:  Diagnosis Date   Arthritis    Coronary artery disease    s/p prior stenting to RCA. Last cath in 05/2019 showed in-stent CTO of RCA and CTO of LCX (medical therpay recommended)   Diverticulosis    Dyspnea    Hard of hearing    Hyperlipidemia    Hypertension    hx of elevation on lyrica , off med and now normal    Paroxysmal SVT (supraventricular tachycardia) (HCC)    Prostate cancer (HCC)    Wears glasses     Past Surgical History:  Procedure Laterality Date   CARDIAC CATHETERIZATION     with 2 stents    COLECTOMY  05/2018   COLONOSCOPY     CORONARY ARTERY BYPASS GRAFT N/A 01/19/2021   Procedure: CORONARY ARTERY BYPASS GRAFTING (CABG), ON PUMP TIMES FOUR, USING LEFT INTERNAL MAMMARY ARTERY AND ENDOSOCPICALLY HARVESTED RIGHT GREATER SAPHENOUS VEIN;  Surgeon: Hilarie Lovely, MD;  Location: [facility] OR;  Service: Open Heart Surgery;  Laterality: N/A;   CYSTOSCOPY WITH URETHRAL DILATATION N/A 01/14/2021   Procedure: CYSTOSCOPY WITH BALLOON  DILATATION;  Surgeon: Erman Hayward, MD;  Location: [facility] ORS;  Service: Urology;  Laterality: N/A;   EYE SURGERY  02/2020   bilaterl cataracts   IR THORACENTESIS ASP PLEURAL SPACE W/IMG GUIDE  02/02/2021   LEFT HEART CATH AND CORONARY ANGIOGRAPHY N/A 12/24/2020   Procedure: LEFT HEART CATH AND CORONARY ANGIOGRAPHY;  Surgeon: Swaziland, Peter M, MD;  Location: [facility] INVASIVE CV LAB;  Service: Cardiovascular;  Laterality: N/A;   PROSTATECTOMY     TEE WITHOUT CARDIOVERSION N/A  01/19/2021   Procedure: TRANSESOPHAGEAL ECHOCARDIOGRAM (TEE);  Surgeon: Hilarie Lovely, MD;  Location: [facility] OR;  Service: Open Heart Surgery;  Laterality: N/A;   TOTAL HIP ARTHROPLASTY Left 11/10/2015   Procedure: LEFT TOTAL HIP ARTHROPLASTY ANTERIOR APPROACH;  Surgeon: Arnie Lao, MD;  Location: [facility] OR;  Service: Orthopedics;  Laterality: Left;    Family History  Problem Relation Age of Onset   Alzheimer's disease Mother    Stroke Father    Diabetes Sister    Heart attack Maternal Grandmother    Cancer Maternal Uncle        prostate   Prostate cancer Maternal Uncle    Cancer Maternal Uncle        prostate   Prostate cancer Maternal Uncle    Colon polyps Brother    Colon cancer Neg Hx    Esophageal cancer Neg Hx    Rectal cancer Neg Hx    Stomach cancer Neg Hx     Social History   Socioeconomic History   Marital status: Married    Spouse name: Not on file   Number of children: 0   Years of education: Not on file   Highest education level: Not on file  Occupational History   Occupation: Employed at [employer]  Tobacco Use   Smoking status: Never   Smokeless tobacco: Never  Vaping Use   Vaping Use: Never  used  Substance and Sexual Activity   Alcohol use: Not Currently    Alcohol/week: 0.0 standard drinks    Comment: rarely   Drug use: No   Sexual activity: Yes    Partners: Female    Birth control/protection: None  Other Topics Concern   Not on file  Social History Narrative   Not on file   Social Determinants of Health   Financial Resource Strain: Not on file  Food Insecurity: Not on file  Transportation Needs: Not on file  Physical Activity: Not on file  Stress: Not on file  Social Connections: Not on file  Intimate Partner Violence: Not on file    Outpatient Medications Prior to Visit  Medication Sig Dispense Refill   acetaminophen  (TYLENOL ) 500 MG tablet Take 1,000 mg by mouth every 8 (eight) hours as needed for mild pain.      amiodarone  (PACERONE ) 100 MG tablet Take one tablet daily for three weeks, then take one tablet every other day, then discontinue. 33 tablet 0   amLODipine  (NORVASC ) 5 MG tablet Take 1 tablet (5 mg total) by mouth daily. 30 tablet 2   amoxicillin  (AMOXIL ) 875 MG tablet Take 1 tablet (875 mg total) by mouth 2 (two) times daily for 10 days. 20 tablet 0   aspirin  EC 81 MG tablet Take 81 mg by mouth daily. Swallow whole.     clonazePAM  (KLONOPIN ) 1 MG tablet Take 1 tablet (1 mg total) by mouth 2 (two) times daily as needed for anxiety. 60 tablet 3   clopidogrel  (PLAVIX ) 75 MG tablet Take 1 tablet (75 mg total) by mouth daily. Stop taking plavix  on Saturday Nov. 5, 2022 90 tablet 1   Eszopiclone  3 MG TABS TAKE 1 TABLET BY MOUTH EVERYDAY AT BEDTIME 30 tablet 2   Evolocumab  (REPATHA  SURECLICK) 140 MG/ML SOAJ Inject 140 mg into the skin every 14 (fourteen) days. 2 mL 11   ezetimibe  (ZETIA ) 10 MG tablet TAKE 1 TABLET BY MOUTH EVERY DAY 90 tablet 3   metoprolol  succinate (TOPROL -XL) 25 MG 24 hr tablet Take 0.5 tablets (12.5 mg total) by mouth daily. Take with or immediately following a meal. (Patient taking differently: Take 25 mg by mouth daily. Take with or immediately following a meal.) 45 tablet 2   nitroGLYCERIN  (NITROSTAT ) 0.4 MG SL tablet Place 1 tablet (0.4 mg total) under the tongue every 5 (five) minutes as needed for chest pain. 25 tablet 2   Omega-3 Fatty Acids (FISH OIL) 1200 MG CAPS Take 2,400 mg by mouth 2 (two) times daily.     No facility-administered medications prior to visit.    Allergies  Allergen Reactions   Ranolazine     Chest discomfort/Reflux   Crestor  [Rosuvastatin ]     myalgias   Lipitor  [Atorvastatin ]     myalgias   Pravastatin      myalgias   Statins Other (See Comments)    myalgias    Review of Systems  Constitutional:  Positive for fever. Negative for appetite change and fatigue.  HENT:  Positive for congestion and sore throat. Negative for ear pain and sinus  pressure.   Eyes:  Negative for pain.  Respiratory:  Positive for cough. Negative for shortness of breath and wheezing.   Cardiovascular:  Negative for chest pain and palpitations.  Gastrointestinal:  Negative for abdominal pain, constipation, diarrhea, nausea and vomiting.  Genitourinary:  Negative for dysuria and frequency.  Musculoskeletal:  Negative for arthralgias, back pain, joint swelling and myalgias.  Skin:  Negative for rash.  Neurological:  Negative for dizziness, weakness and headaches.  Psychiatric/Behavioral:  Negative for dysphoric mood. The patient is not nervous/anxious.       Objective:    Physical Exam Constitutional:      Appearance: Normal appearance. He is normal weight.  HENT:     Right Ear: Tympanic membrane normal.     Left Ear: Tympanic membrane normal.     Nose: Nose normal.  Cardiovascular:     Rate and Rhythm: Normal rate and regular rhythm.     Pulses: Normal pulses.     Heart sounds: Normal heart sounds.  Pulmonary:     Effort: Pulmonary effort is normal.     Breath sounds: Normal breath sounds.  Abdominal:     General: Bowel sounds are normal.  Neurological:     Mental Status: He is oriented to person, place, and time. Mental status is at baseline.  Psychiatric:        Mood and Affect: Mood normal.        Behavior: Behavior normal.   BP (!) 142/74   Pulse 72   Temp 98.4 F (36.9 C)   Resp 18   Wt 217 lb 9.6 oz (98.7 kg)   SpO2 95%   BMI 30.35 kg/m  Wt Readings from Last 3 Encounters:  06/07/21 217 lb 9.6 oz (98.7 kg)  05/31/21 223 lb 6.4 oz (101.3 kg)  05/18/21 219 lb (99.3 kg)    Health Maintenance Due  Topic Date Due   TETANUS/TDAP  Never done   Zoster Vaccines- Shingrix (1 of 2) Never done   Pneumonia Vaccine 37+ Years old (1 - PCV) Never done    There are no preventive care reminders to display for this patient.   No results found for: TSH Lab Results  Component Value Date   WBC 9.3 03/17/2021   HGB 14.6 03/17/2021    HCT 44.5 03/17/2021   MCV 88.8 03/17/2021   PLT 170 03/17/2021   Lab Results  Component Value Date   NA 137 03/17/2021   K 3.8 03/17/2021   CO2 21 (L) 03/17/2021   GLUCOSE 142 (H) 03/17/2021   BUN 24 (H) 03/17/2021   CREATININE 1.17 03/17/2021   BILITOT 0.5 03/17/2021   ALKPHOS 63 03/17/2021   AST 17 03/17/2021   ALT 30 03/17/2021   PROT 6.7 03/17/2021   ALBUMIN  3.8 03/17/2021   CALCIUM  8.7 (L) 03/17/2021   ANIONGAP 8 03/17/2021   EGFR 77 03/04/2021   Lab Results  Component Value Date   CHOL 209 (H) 03/04/2021   Lab Results  Component Value Date   HDL 34 (L) 03/04/2021   Lab Results  Component Value Date   LDLCALC 128 (H) 03/04/2021   Lab Results  Component Value Date   TRIG 263 (H) 03/04/2021   Lab Results  Component Value Date   CHOLHDL 6.1 (H) 03/04/2021   Lab Results  Component Value Date   HGBA1C 5.8 (H) 03/04/2021       Assessment & Plan:   Diagnoses and all orders for this visit: Bronchopneumonia -     DG Chest 2 View Patient has bronchopneumonia and is doing wetter after starting antibiotics.  Get Chest x-ray, Note for work         Follow-up: Return if symptoms worsen or fail to improve.  An After Visit Summary was printed and given to the patient.  Rea Cambridge, MD [facility] [phone]

## 2021-06-10 ENCOUNTER — Other Ambulatory Visit: Payer: Self-pay | Admitting: Legal Medicine

## 2021-06-10 DIAGNOSIS — I25110 Atherosclerotic heart disease of native coronary artery with unstable angina pectoris: Secondary | ICD-10-CM

## 2021-06-15 ENCOUNTER — Telehealth: Payer: Self-pay

## 2021-06-15 ENCOUNTER — Other Ambulatory Visit: Payer: Self-pay | Admitting: Legal Medicine

## 2021-06-15 DIAGNOSIS — R052 Subacute cough: Secondary | ICD-10-CM

## 2021-06-15 MED ORDER — HYDROCOD POLI-CHLORPHE POLI ER 10-8 MG/5ML PO SUER: 5.0000 mL | Freq: Two times a day (BID) | ORAL | 0 refills | Status: DC | PRN

## 2021-06-15 NOTE — Telephone Encounter (Signed)
Patient was informed.

## 2021-06-15 NOTE — Telephone Encounter (Signed)
Patient still taking antibiotics, but he feels a little better. He asked if you can send something for coughing because he has trying delsium, mucinex, robitussin. He still having sinus problem and it is not clearing up. He asked if he needs different antibiotic? ?

## 2021-06-15 NOTE — Telephone Encounter (Signed)
Tussionex called in ?

## 2021-06-17 ENCOUNTER — Telehealth: Payer: Self-pay

## 2021-06-17 NOTE — Telephone Encounter (Signed)
Patient's wife called stating that Jacob Collier has a yeast infection on his penis due to taking an ABT recently and is requesting something be sent into the [facility] in [place]. ?

## 2021-06-18 ENCOUNTER — Telehealth: Payer: Self-pay

## 2021-06-18 NOTE — Telephone Encounter (Signed)
Patients wife called this morning requesting a telephone appointment for a cough and not feeling better. The wife stated that he needs to be seen today by someone. She was notified that we do not have an opening. With that being said, he needs to go to an UC to be seen. The wife was notified. ?

## 2021-06-20 ENCOUNTER — Other Ambulatory Visit: Payer: Self-pay | Admitting: Physician Assistant

## 2021-06-21 ENCOUNTER — Encounter: Payer: Self-pay | Admitting: Legal Medicine

## 2021-06-21 ENCOUNTER — Ambulatory Visit (INDEPENDENT_AMBULATORY_CARE_PROVIDER_SITE_OTHER): Payer: Managed Care, Other (non HMO) | Admitting: Legal Medicine

## 2021-06-21 VITALS — BP 110/64 | HR 56 | Temp 97.5°F | Resp 18 | Ht 71.0 in | Wt 222.0 lb

## 2021-06-21 DIAGNOSIS — J180 Bronchopneumonia, unspecified organism: Secondary | ICD-10-CM | POA: Diagnosis not present

## 2021-06-21 MED ORDER — TRIAMCINOLONE ACETONIDE 40 MG/ML IJ SUSP
80.0000 mg | Freq: Once | INTRAMUSCULAR | Status: AC
  Administered 2021-06-21: 80 mg via INTRAMUSCULAR

## 2021-06-21 NOTE — Progress Notes (Signed)
 Acute Office Visit  Subjective:    Patient ID: Jacob Collier, male    DOB: 09-28-1955, 66 y.o.   MRN: [id number]  Chief Complaint  Patient presents with   Cough    HPI: Patient is in today for He started coughing again. He was started on augmentin  but he is fatigued and dyspneic.  He tried to go to work but too weak.  Past Medical History:  Diagnosis Date   Arthritis    Coronary artery disease    s/p prior stenting to RCA. Last cath in 05/2019 showed in-stent CTO of RCA and CTO of LCX (medical therpay recommended)   Diverticulosis    Dyspnea    Hard of hearing    Hyperlipidemia    Hypertension    hx of elevation on lyrica , off med and now normal    Paroxysmal SVT (supraventricular tachycardia) (HCC)    Prostate cancer (HCC)    Wears glasses     Past Surgical History:  Procedure Laterality Date   CARDIAC CATHETERIZATION     with 2 stents    COLECTOMY  05/2018   COLONOSCOPY     CORONARY ARTERY BYPASS GRAFT N/A 01/19/2021   Procedure: CORONARY ARTERY BYPASS GRAFTING (CABG), ON PUMP TIMES FOUR, USING LEFT INTERNAL MAMMARY ARTERY AND ENDOSOCPICALLY HARVESTED RIGHT GREATER SAPHENOUS VEIN;  Surgeon: Hilarie Lovely, MD;  Location: [facility] OR;  Service: Open Heart Surgery;  Laterality: N/A;   CYSTOSCOPY WITH URETHRAL DILATATION N/A 01/14/2021   Procedure: CYSTOSCOPY WITH BALLOON  DILATATION;  Surgeon: Erman Hayward, MD;  Location: [facility] ORS;  Service: Urology;  Laterality: N/A;   EYE SURGERY  02/2020   bilaterl cataracts   IR THORACENTESIS ASP PLEURAL SPACE W/IMG GUIDE  02/02/2021   LEFT HEART CATH AND CORONARY ANGIOGRAPHY N/A 12/24/2020   Procedure: LEFT HEART CATH AND CORONARY ANGIOGRAPHY;  Surgeon: Swaziland, Peter M, MD;  Location: [facility] INVASIVE CV LAB;  Service: Cardiovascular;  Laterality: N/A;   PROSTATECTOMY     TEE WITHOUT CARDIOVERSION N/A 01/19/2021   Procedure: TRANSESOPHAGEAL ECHOCARDIOGRAM (TEE);  Surgeon: Hilarie Lovely, MD;  Location: [facility] OR;  Service: Open  Heart Surgery;  Laterality: N/A;   TOTAL HIP ARTHROPLASTY Left 11/10/2015   Procedure: LEFT TOTAL HIP ARTHROPLASTY ANTERIOR APPROACH;  Surgeon: Arnie Lao, MD;  Location: [facility] OR;  Service: Orthopedics;  Laterality: Left;    Family History  Problem Relation Age of Onset   Alzheimer's disease Mother    Stroke Father    Diabetes Sister    Heart attack Maternal Grandmother    Cancer Maternal Uncle        prostate   Prostate cancer Maternal Uncle    Cancer Maternal Uncle        prostate   Prostate cancer Maternal Uncle    Colon polyps Brother    Colon cancer Neg Hx    Esophageal cancer Neg Hx    Rectal cancer Neg Hx    Stomach cancer Neg Hx     Social History   Socioeconomic History   Marital status: Married    Spouse name: Not on file   Number of children: 0   Years of education: Not on file   Highest education level: Not on file  Occupational History   Occupation: Employed at [employer]  Tobacco Use   Smoking status: Never   Smokeless tobacco: Never  Vaping Use   Vaping Use: Never used  Substance and Sexual Activity   Alcohol use: Not Currently  Alcohol/week: 0.0 standard drinks    Comment: rarely   Drug use: No   Sexual activity: Yes    Partners: Female    Birth control/protection: None  Other Topics Concern   Not on file  Social History Narrative   Not on file   Social Determinants of Health   Financial Resource Strain: Not on file  Food Insecurity: Not on file  Transportation Needs: Not on file  Physical Activity: Not on file  Stress: Not on file  Social Connections: Not on file  Intimate Partner Violence: Not on file    Outpatient Medications Prior to Visit  Medication Sig Dispense Refill   acetaminophen  (TYLENOL ) 500 MG tablet Take 1,000 mg by mouth every 8 (eight) hours as needed for mild pain.     amiodarone  (PACERONE ) 100 MG tablet Take one tablet daily for three weeks, then take one tablet every other day, then discontinue. 33  tablet 0   amLODipine  (NORVASC ) 5 MG tablet Take 1 tablet (5 mg total) by mouth daily. 30 tablet 2   amoxicillin -clavulanate (AUGMENTIN ) 875-125 MG tablet Take 1 tablet by mouth 2 (two) times daily.     aspirin  EC 81 MG tablet Take 81 mg by mouth daily. Swallow whole.     clonazePAM  (KLONOPIN ) 1 MG tablet TAKE 1 TABLET BY MOUTH 2 TIMES DAILY AS NEEDED FOR ANXIETY. 60 tablet 3   clopidogrel  (PLAVIX ) 75 MG tablet Take 1 tablet (75 mg total) by mouth daily. Stop taking plavix  on Saturday Nov. 5, 2022 90 tablet 1   Eszopiclone  3 MG TABS TAKE 1 TABLET BY MOUTH EVERYDAY AT BEDTIME 30 tablet 2   Evolocumab  (REPATHA  SURECLICK) 140 MG/ML SOAJ Inject 140 mg into the skin every 14 (fourteen) days. 2 mL 11   ezetimibe  (ZETIA ) 10 MG tablet TAKE 1 TABLET BY MOUTH EVERY DAY 90 tablet 3   metoprolol  succinate (TOPROL -XL) 25 MG 24 hr tablet Take 0.5 tablets (12.5 mg total) by mouth daily. Take with or immediately following a meal. (Patient taking differently: Take 25 mg by mouth daily. Take with or immediately following a meal.) 45 tablet 2   nitroGLYCERIN  (NITROSTAT ) 0.4 MG SL tablet Place 1 tablet (0.4 mg total) under the tongue every 5 (five) minutes as needed for chest pain. 25 tablet 2   Omega-3 Fatty Acids (FISH OIL) 1200 MG CAPS Take 2,400 mg by mouth 2 (two) times daily.     chlorpheniramine-HYDROcodone  (TUSSIONEX PENNKINETIC ER) 10-8 MG/5ML Take 5 mLs by mouth every 12 (twelve) hours as needed for cough. 115 mL 0   No facility-administered medications prior to visit.    Allergies  Allergen Reactions   Ranolazine     Chest discomfort/Reflux   Crestor  [Rosuvastatin ]     myalgias   Lipitor  [Atorvastatin ]     myalgias   Pravastatin      myalgias   Statins Other (See Comments)    myalgias    Review of Systems  Constitutional:  Positive for fatigue. Negative for chills and fever.  Respiratory:  Positive for cough and shortness of breath.   Cardiovascular:  Negative for chest pain.  Neurological:   Positive for weakness. Negative for light-headedness and headaches.      Objective:    Physical Exam Vitals reviewed.  Constitutional:      Appearance: Normal appearance. He is ill-appearing.  HENT:     Head: Normocephalic and atraumatic.     Right Ear: Tympanic membrane normal.     Mouth/Throat:     Mouth:  Mucous membranes are moist.  Eyes:     Conjunctiva/sclera: Conjunctivae normal.     Pupils: Pupils are equal, round, and reactive to light.  Cardiovascular:     Rate and Rhythm: Normal rate and regular rhythm.     Pulses: Normal pulses.     Heart sounds: No murmur heard.   No gallop.  Pulmonary:     Effort: Pulmonary effort is normal.     Breath sounds: Rhonchi present.  Abdominal:     General: Abdomen is flat. Bowel sounds are normal.  Musculoskeletal:     Cervical back: Normal range of motion.  Neurological:     General: No focal deficit present.     Mental Status: He is alert and oriented to person, place, and time.    BP 110/64   Pulse (!) 56   Temp (!) 97.5 F (36.4 C)   Resp 18   Ht 5\' 11"  (1.803 m)   Wt 222 lb (100.7 kg)   SpO2 95%   BMI 30.96 kg/m  Wt Readings from Last 3 Encounters:  06/21/21 222 lb (100.7 kg)  06/07/21 217 lb 9.6 oz (98.7 kg)  05/31/21 223 lb 6.4 oz (101.3 kg)    Health Maintenance Due  Topic Date Due   TETANUS/TDAP  Never done   Zoster Vaccines- Shingrix (1 of 2) Never done   Pneumonia Vaccine 67+ Years old (1 - PCV) Never done    There are no preventive care reminders to display for this patient.   No results found for: TSH Lab Results  Component Value Date   WBC 9.3 03/17/2021   HGB 14.6 03/17/2021   HCT 44.5 03/17/2021   MCV 88.8 03/17/2021   PLT 170 03/17/2021   Lab Results  Component Value Date   NA 137 03/17/2021   K 3.8 03/17/2021   CO2 21 (L) 03/17/2021   GLUCOSE 142 (H) 03/17/2021   BUN 24 (H) 03/17/2021   CREATININE 1.17 03/17/2021   BILITOT 0.5 03/17/2021   ALKPHOS 63 03/17/2021   AST 17  03/17/2021   ALT 30 03/17/2021   PROT 6.7 03/17/2021   ALBUMIN  3.8 03/17/2021   CALCIUM  8.7 (L) 03/17/2021   ANIONGAP 8 03/17/2021   EGFR 77 03/04/2021   Lab Results  Component Value Date   CHOL 209 (H) 03/04/2021   Lab Results  Component Value Date   HDL 34 (L) 03/04/2021   Lab Results  Component Value Date   LDLCALC 128 (H) 03/04/2021   Lab Results  Component Value Date   TRIG 263 (H) 03/04/2021   Lab Results  Component Value Date   CHOLHDL 6.1 (H) 03/04/2021   Lab Results  Component Value Date   HGBA1C 5.8 (H) 03/04/2021       Assessment & Plan:   Diagnoses and all orders for this visit: Bronchopneumonia -     triamcinolone  acetonide (KENALOG -40) injection 80 mg Patient has exacerbation of bronchopneumonia, give kenalog , continue augmentin , set up for duneb with nebulizer at home.Note for work        Follow-up: Return in about 1 week (around 06/28/2021).  An After Visit Summary was printed and given to the patient.  Rea Cambridge, MD [facility] [phone]

## 2021-06-21 NOTE — Telephone Encounter (Signed)
Patient was informed.

## 2021-07-01 ENCOUNTER — Telehealth: Payer: Self-pay | Admitting: Cardiovascular Disease

## 2021-07-01 ENCOUNTER — Other Ambulatory Visit: Payer: Self-pay | Admitting: *Deleted

## 2021-07-01 DIAGNOSIS — Z0000 Encounter for general adult medical examination without abnormal findings: Secondary | ICD-10-CM

## 2021-07-01 NOTE — Telephone Encounter (Signed)
Patient states he is coming in for labs next week he would like for a PSA ultra sensitive test to be add to his lab orders.  ?

## 2021-07-01 NOTE — Telephone Encounter (Signed)
Order entered ./cy ?

## 2021-07-05 ENCOUNTER — Other Ambulatory Visit: Payer: Self-pay

## 2021-07-05 DIAGNOSIS — Z0000 Encounter for general adult medical examination without abnormal findings: Secondary | ICD-10-CM

## 2021-07-05 LAB — CBC
Hematocrit: 50.5 % (ref 37.5–51.0)
Hemoglobin: 16.8 g/dL (ref 13.0–17.7)
MCH: 28.9 pg (ref 26.6–33.0)
MCHC: 33.3 g/dL (ref 31.5–35.7)
MCV: 87 fL (ref 79–97)
Platelets: 187 10*3/uL (ref 150–450)
RBC: 5.82 x10E6/uL — ABNORMAL HIGH (ref 4.14–5.80)
RDW: 14.5 % (ref 11.6–15.4)
WBC: 6.8 10*3/uL (ref 3.4–10.8)

## 2021-07-05 LAB — TSH: TSH: 2.45 u[IU]/mL (ref 0.450–4.500)

## 2021-07-05 LAB — COMPREHENSIVE METABOLIC PANEL
ALT: 21 IU/L (ref 0–44)
AST: 15 IU/L (ref 0–40)
Albumin/Globulin Ratio: 2.2 (ref 1.2–2.2)
Albumin: 4.3 g/dL (ref 3.8–4.8)
Alkaline Phosphatase: 81 IU/L (ref 44–121)
BUN/Creatinine Ratio: 18 (ref 10–24)
BUN: 20 mg/dL (ref 8–27)
Bilirubin Total: 0.4 mg/dL (ref 0.0–1.2)
CO2: 23 mmol/L (ref 20–29)
Calcium: 9.5 mg/dL (ref 8.6–10.2)
Chloride: 104 mmol/L (ref 96–106)
Creatinine, Ser: 1.13 mg/dL (ref 0.76–1.27)
Globulin, Total: 2 g/dL (ref 1.5–4.5)
Glucose: 91 mg/dL (ref 70–99)
Potassium: 4.9 mmol/L (ref 3.5–5.2)
Sodium: 139 mmol/L (ref 134–144)
Total Protein: 6.3 g/dL (ref 6.0–8.5)
eGFR: 71 mL/min/{1.73_m2} (ref >59)

## 2021-07-05 LAB — LIPID PANEL
Chol/HDL Ratio: 2.1 ratio (ref 0.0–5.0)
Cholesterol, Total: 75 mg/dL — ABNORMAL LOW (ref 100–199)
HDL: 36 mg/dL — ABNORMAL LOW (ref >39)
LDL Chol Calc (NIH): 20 mg/dL (ref 0–99)
Triglycerides: 94 mg/dL (ref 0–149)
VLDL Cholesterol Cal: 19 mg/dL (ref 5–40)

## 2021-07-05 LAB — PSA: Prostate Specific Ag, Serum: <0.1 ng/mL (ref 0.0–4.0)

## 2021-07-13 ENCOUNTER — Encounter: Payer: Self-pay | Admitting: Legal Medicine

## 2021-07-13 ENCOUNTER — Ambulatory Visit (INDEPENDENT_AMBULATORY_CARE_PROVIDER_SITE_OTHER): Payer: Managed Care, Other (non HMO) | Admitting: Legal Medicine

## 2021-07-13 DIAGNOSIS — M461 Sacroiliitis, not elsewhere classified: Secondary | ICD-10-CM | POA: Insufficient documentation

## 2021-07-13 NOTE — Progress Notes (Signed)
 Acute Office Visit  Subjective:    Patient ID: Jacob Collier, male    DOB: [birth year], 66 y.o.   MRN: [id number]  Chief Complaint  Patient presents with   Back Pain    HPI: Patient is in today for low back pain. He was moving furniture over weekend and now having SI joint pain right.    He has chronic neck pain with spinal stenosis, to see neurosurgeon for possible fusion.  Past Medical History:  Diagnosis Date   Arthritis    Coronary artery disease    s/p prior stenting to RCA. Last cath in 05/2019 showed in-stent CTO of RCA and CTO of LCX (medical therpay recommended)   Diverticulosis    Dyspnea    Hard of hearing    Hyperlipidemia    Hypertension    hx of elevation on lyrica , off med and now normal    Paroxysmal SVT (supraventricular tachycardia) (HCC)    Prostate cancer (HCC)    Wears glasses     Past Surgical History:  Procedure Laterality Date   CARDIAC CATHETERIZATION     with 2 stents    COLECTOMY  05/2018   COLONOSCOPY     CORONARY ARTERY BYPASS GRAFT N/A 01/19/2021   Procedure: CORONARY ARTERY BYPASS GRAFTING (CABG), ON PUMP TIMES FOUR, USING LEFT INTERNAL MAMMARY ARTERY AND ENDOSOCPICALLY HARVESTED RIGHT GREATER SAPHENOUS VEIN;  Surgeon: Hilarie Lovely, MD;  Location: [facility] OR;  Service: Open Heart Surgery;  Laterality: N/A;   CYSTOSCOPY WITH URETHRAL DILATATION N/A 01/14/2021   Procedure: CYSTOSCOPY WITH BALLOON  DILATATION;  Surgeon: Erman Hayward, MD;  Location: [facility] OR[facility];  Service: Urology;  Laterality: N/A;   EYE SURGERY  02/2020   bilaterl cataracts   IR THORACENTESIS ASP PLEURAL SPACE W/IMG GUIDE  02/02/2021   LEFT HEART CATH AND CORONARY ANGIOGRAPHY N/A 12/24/2020   Procedure: LEFT HEART CATH AND CORONARY ANGIOGRAPHY;  Surgeon: Swazil[place], Peter M, MD;  Location: [facility] INVASIVE CV LAB;  Service: Cardiovascular;  Laterality: N/A;   PROSTATECTOMY     TEE WITHOUT CARDIOVERSION N/A 01/19/2021   Procedure: TRANSESOPHAGEAL ECHOCARDIOGRAM (TEE);  Surgeon:  Hilarie Lovely, MD;  Location: [facility] OR;  Service: Open Heart Surgery;  Laterality: N/A;   TOTAL HIP ARTHROPLASTY Left 11/10/2015   Procedure: LEFT TOTAL HIP ARTHROPLASTY ANTERIOR APPROACH;  Surgeon: Arnie Lao, MD;  Location: [facility] OR;  Service: Orthopedics;  Laterality: Left;    Family History  Problem Relation Age of Onset   Alzheimer's disease Mother    Stroke Father    Diabetes Sister    Heart attack Maternal Grandmother    Cancer Maternal Uncle        prostate   Prostate cancer Maternal Uncle    Cancer Maternal Uncle        prostate   Prostate cancer Maternal Uncle    Colon polyps Brother    Colon cancer Neg Hx    Esophageal cancer Neg Hx    Rectal cancer Neg Hx    Stomach cancer Neg Hx     Social History   Socioeconomic History   Marital status: Married    Spouse name: Not on file   Number of children: 0   Years of education: Not on file   Highest education level: Not on file  Occupational History   Occupation: Employed at [employer]  Tobacco Use   Smoking status: Never   Smokeless tobacco: Never  Vaping Use   Vaping Use: Never used  Substance and  Sexual Activity   Alcohol use: Not Currently    Alcohol/week: 0.0 standard drinks    Comment: rarely   Drug use: No   Sexual activity: Yes    Partners: Female    Birth control/protection: None  Other Topics Concern   Not on file  Social History Narrative   Not on file   Social Determinants of Health   Financial Resource Strain: Not on file  Food Insecurity: Not on file  Transportation Needs: Not on file  Physical Activity: Not on file  Stress: Not on file  Social Connections: Not on file  Intimate Partner Violence: Not on file    Outpatient Medications Prior to Visit  Medication Sig Dispense Refill   acetaminophen  (TYLENOL ) 500 MG tablet Take 1,000 mg by mouth every 8 (eight) hours as needed for mild pain.     amiodarone  (PACERONE ) 100 MG tablet Take one tablet daily for three weeks,  then take one tablet every other day, then discontinue. 33 tablet 0   amLODipine  (NORVASC ) 5 MG tablet Take 1 tablet (5 mg total) by mouth daily. 30 tablet 2   aspirin  EC 81 MG tablet Take 81 mg by mouth daily. Swallow whole.     clonazePAM  (KLONOPIN ) 1 MG tablet TAKE 1 TABLET BY MOUTH 2 TIMES DAILY AS NEEDED FOR ANXIETY. 60 tablet 3   clopidogrel  (PLAVIX ) 75 MG tablet Take 1 tablet (75 mg total) by mouth daily. Stop taking plavix  on Saturday Nov. 5, 2022 90 tablet 1   Eszopiclone  3 MG TABS TAKE 1 TABLET BY MOUTH EVERYDAY AT BEDTIME 30 tablet 2   Evolocumab  (REPATHA  SURECLICK) 140 MG/ML SOAJ Inject 140 mg into the skin every 14 (fourteen) days. 2 mL 11   metoprolol  succinate (TOPROL -XL) 25 MG 24 hr tablet Take 0.5 tablets (12.5 mg total) by mouth daily. Take with or immediately following a meal. (Patient taking differently: Take 25 mg by mouth daily. Take with or immediately following a meal.) 45 tablet 2   nitroGLYCERIN  (NITROSTAT ) 0.4 MG SL tablet Place 1 tablet (0.4 mg total) under the tongue every 5 (five) minutes as needed for chest pain. 25 tablet 2   Omega-3 Fatty Acids (FISH OIL) 1200 MG CAPS Take 2,400 mg by mouth 2 (two) times daily.     ezetimibe  (ZETIA ) 10 MG tablet TAKE 1 TABLET BY MOUTH EVERY DAY (Patient not taking: Reported on 07/13/2021) 90 tablet 3   amoxicillin -clavulanate (AUGMENTIN ) 875-125 MG tablet Take 1 tablet by mouth 2 (two) times daily.     No facility-administered medications prior to visit.    Allergies  Allergen Reactions   Ranolazine     Chest discomfort/Reflux   Crestor  [Rosuvastatin ]     myalgias   Lipitor  [Atorvastatin ]     myalgias   Pravastatin      myalgias   Statins Other (See Comments)    myalgias    Review of Systems  Constitutional:  Positive for fatigue. Negative for chills and fever.  HENT:  Negative for congestion.   Eyes:  Negative for visual disturbance.  Respiratory:  Negative for cough and shortness of breath.   Cardiovascular:   Negative for chest pain.  Genitourinary: Negative.   Musculoskeletal:  Positive for back pain (right low back pain w/radiating pain in right leg.).       Pain right SI joint.  Neurological: Negative.  Negative for numbness.      Objective:    Physical Exam Vitals reviewed.  Constitutional:      General: He  is in acute distress.     Appearance: Normal appearance.  HENT:     Right Ear: Tympanic membrane normal.     Left Ear: Tympanic membrane normal.     Nose: Nose normal.     Mouth/Throat:     Mouth: Mucous membranes are moist.     Pharynx: Oropharynx is clear.  Eyes:     Extraocular Movements: Extraocular movements intact.     Conjunctiva/sclera: Conjunctivae normal.     Pupils: Pupils are equal, round, and reactive to light.  Cardiovascular:     Rate and Rhythm: Normal rate and regular rhythm.     Pulses: Normal pulses.     Heart sounds: Normal heart sounds. No murmur heard.   No gallop.  Pulmonary:     Effort: Pulmonary effort is normal. No respiratory distress.     Breath sounds: Normal breath sounds. No wheezing.  Abdominal:     General: Abdomen is flat. Bowel sounds are normal. There is no distension.     Palpations: Abdomen is soft.     Tenderness: There is no abdominal tenderness.  Musculoskeletal:     Cervical back: Normal range of motion.     Comments: Pain right SI joint, negative straight leg raising, normal sensation and strength in right leg  Skin:    General: Skin is warm.     Capillary Refill: Capillary refill takes less than 2 seconds.  Neurological:     General: No focal deficit present.     Mental Status: He is alert and oriented to person, place, and time. Mental status is at baseline.     Gait: Gait normal.     Deep Tendon Reflexes: Reflexes normal.    BP 140/78   Pulse 60   Temp (!) 97.4 F (36.3 C)   Resp 14   Ht 5\' 11"  (1.803 m)   Wt 221 lb (100.2 kg)   BMI 30.82 kg/m  Wt Readings from Last 3 Encounters:  07/13/21 221 lb (100.2 kg)   06/21/21 222 lb (100.7 kg)  06/07/21 217 lb 9.6 oz (98.7 kg)    Health Maintenance Due  Topic Date Due   TETANUS/TDAP  Never done   Zoster Vaccines- Shingrix (1 of 2) Never done   Pneumonia Vaccine 57+ Years old (1 - PCV) Never done    There are no preventive care reminders to display for this patient.   Lab Results  Component Value Date   TSH 2.450 07/05/2021   Lab Results  Component Value Date   WBC 6.8 07/05/2021   HGB 16.8 07/05/2021   HCT 50.5 07/05/2021   MCV 87 07/05/2021   PLT 187 07/05/2021   Lab Results  Component Value Date   NA 139 07/05/2021   K 4.9 07/05/2021   CO2 23 07/05/2021   GLUCOSE 91 07/05/2021   BUN 20 07/05/2021   CREATININE 1.13 07/05/2021   BILITOT 0.4 07/05/2021   ALKPHOS 81 07/05/2021   AST 15 07/05/2021   ALT 21 07/05/2021   PROT 6.3 07/05/2021   ALBUMIN  4.3 07/05/2021   CALCIUM  9.5 07/05/2021   ANIONGAP 8 03/17/2021   EGFR 71 07/05/2021   Lab Results  Component Value Date   CHOL 75 (L) 07/05/2021   Lab Results  Component Value Date   HDL 36 (L) 07/05/2021   Lab Results  Component Value Date   LDLCALC 20 07/05/2021   Lab Results  Component Value Date   TRIG 94 07/05/2021   Lab Results  Component  Value Date   CHOLHDL 2.1 07/05/2021   Lab Results  Component Value Date   HGBA1C 5.8 (H) 03/04/2021       Assessment & Plan:   Problem List Items Addressed This Visit       Musculoskeletal and Integument   Sacroiliitis (HCC) Patient is having sacroiliac pain on the right side after he moved furniture.  I injected his SI joint.  After consent was obtained, using sterile technique the right SI joint was prepped and plain Lidocaine  1% was used Ethyl Chloride. The joint was entered and .  Steroid 80 mg and 3 ml plain Lidocaine  was then injected and the needle withdrawn.  The procedure was well tolerated.  The patient is asked to continue to rest the joint for a few more days before resuming regular activities.  It may be  more painful for the first 1-2 days.  Watch for fever, or increased swelling or persistent pain in the joint. Call or return to clinic prn if such symptoms occur or there is failure to improve as anticipated.     Note further work was given    Follow-up: Return if symptoms worsen or fail to improve.  An After Visit Summary was printed and given to the patient.  Rea Cambridge, MD [facility] [phone]

## 2021-07-27 ENCOUNTER — Telehealth: Payer: Self-pay | Admitting: Physical Medicine and Rehabilitation

## 2021-07-27 NOTE — Telephone Encounter (Signed)
Pt returned call to [facility]. Call pt between 11:30 am to 12 pm. Please call pt at [phone]

## 2021-08-01 NOTE — Progress Notes (Signed)
Cardiology Office Note:    Date:  08/11/2021   ID:  Jacob Collier, DOB 05/24/1955, MRN [id number]  PCP:  [name], MD  Cardiologist:  [name], MD  Electrophysiologist:  None   Referring MD: [name],*   Chief Complaint: follow-up of CAD  History of Present Illness:    Jacob Collier is a 66 y.o. male with a history of CAD s/p DES to RCA in 01/2019 and then CABG x4 (LIMA to LAD, reverse SVG to PDA, reverse SVG to OM1, and reverse SVG to 1st Diag) in 01/2021, left pleural effusion following CABG s/p thoracentesis, post-op atrial fibrillation not on anticoagulation, paroxysmal SVT noted on monitor in 07/2019, hypertension, hyperlipidemia intolerant to statins, anxiety, prior prostate cancer, and polycythemia who is followed by Dr. [name] and presents today for routine follow-up of CAD.  Patient has a known history of CAD and was previously followed by Cardiology at [facility] and now follows with Dr. [name]. Reviewed records in [place]. Cardiac catheterization in 01/2019 at [facility] showed CTO of LCX and RCA. Patient underwent successful PCI with DES to proximal to mid RCA lesion. Zio monitor was ordered in 02/2019 which showed frequent PVC (8.5% burden) and non-sustained VT. Myoview was therefore ordered to rule out ischemia as the cause. This was done in 05/2019 showed inferolateral wall defect with associated wall motion abnormality compatible with prior MI. He was started on Ranexa but was unable to tolerate this. Patient presented to [facility] later that month with chest pain. Echo showed LVEF of 55-60% with akinesis of the basal inferior and inferoseptal wall and hypokinesis of inferolateral walls. He underwent repeat cardiac catheterization which showed instent CTO of RCA. Medical therapy was recommended with consideration for CABG if unsatisfactory response with optimization of antianginals.   He presented to [facility] ED in 06/2019 with  palpitations and tightness in his neck and was found to be in SVT with rates in the 200s but converted back to sinus rhythm on his own. He was started on Toprol-XL and Event Monitor was ordered which showed underlying sinus rhythm with average heart rate of 65 bpm and occasional PVCs/ventricular couplets, one 4 beat episodes of non-sustained VT, and a 36 second episode of SVT with rates in the 170s. There were no pauses or evidence of atrial fibrillation. Toprol-XL was increased.  At an office visit with me in 12/2020, patient reported recurrent chest pain requiring multiple doses of Nitro. Cardiac catheterization was recommended and showed severe 3 vessel CAD with a new 75% mid LAD lesion compared to prior cath in 2021. Patient was referred to CT surgery for consideratio of CABG. Echo showed LVEF of 55% with hypokinesis of antero-lateral wall, posterior wall, and basal inferior segment as well as grade 2 diastolic dysfunction, mild MR, and mild dilatation of the ascending aorta measuring 41 mm. Patient ultimately underwent CABG x4 with LIMA to LAD, reverse SVG to PDA, reverse SVG to OM1, and reverse SVG to 1st Diag with Dr. [name] on 01/19/2021. Urology placed a foley catheter prior to surgery given history of bulbar urethral stricture and this was kept throughout hospitalization and at time of discharge. He did develop post-op atrial fibrillation and was started on IV Amiodarone and converted back to sinus rhythm. He was discharged on PO Amiodarone but was not started on anticoagulation. Post-op course also complicated by bladder spasms from indwelling foley which was treated with Oxybutin and phlebitis due to IV infiltration of Amiodarone  which was treated with Keflex. Following discharge, he also required thoracentesis on 02/02/2021 for left pleural effusion.  Patient was last seen by Dr. [name] in 05/2021 at which time he was doing well from a cardiac standpoint. Amiodarone was decreased and he was  instructed to stop this completely after 6 weeks.  Patient presents today for follow-up. Here with his wife. Patient is doing so much better since a last saw him. He is completely asymptomatic from a cardiac standpoint and states everything is better since his CABG. No chest pain, shortness of breath, orthopnea, PND, lower extremity edema, palpitations, lightheadedness, dizziness, or syncope. He has weaned off the Amiodarone and has had no recurrent arrhythmias. He is back at work and working over 40 hours per weeks and is having no issues with this. His biggest problem now his neck pain and he has upcoming cervical spine fusion planned. He is easily able to complete >4.0 METs of activity.  Past Medical History:  Diagnosis Date   Arthritis    Coronary artery disease    s/p prior stenting to RCA. Last cath in 05/2019 showed in-stent CTO of RCA and CTO of LCX (medical therpay recommended)   Diverticulosis    Dyspnea    Hard of hearing    Hyperlipidemia    Hypertension    hx of elevation on lyrica, off med and now normal    Paroxysmal SVT (supraventricular tachycardia) ([place])    Prostate cancer ([place])    Wears glasses     Past Surgical History:  Procedure Laterality Date   CARDIAC CATHETERIZATION     with 2 stents    COLECTOMY  05/2018   COLONOSCOPY     CORONARY ARTERY BYPASS GRAFT N/A 01/19/2021   Procedure: CORONARY ARTERY BYPASS GRAFTING (CABG), ON PUMP TIMES FOUR, USING LEFT INTERNAL MAMMARY ARTERY AND ENDOSOCPICALLY HARVESTED RIGHT GREATER SAPHENOUS VEIN;  Surgeon: Lajuana Matte, MD;  Location: [place];  Service: Open Heart Surgery;  Laterality: N/A;   CYSTOSCOPY WITH URETHRAL DILATATION N/A 01/14/2021   Procedure: CYSTOSCOPY WITH BALLOON  DILATATION;  Surgeon: Bjorn Loser, MD;  Location: [facility];  Service: Urology;  Laterality: N/A;   EYE SURGERY  02/2020   bilaterl cataracts   IR THORACENTESIS ASP PLEURAL SPACE W/IMG GUIDE  02/02/2021   LEFT HEART CATH AND CORONARY  ANGIOGRAPHY N/A 12/24/2020   Procedure: LEFT HEART CATH AND CORONARY ANGIOGRAPHY;  Surgeon: Martinique, Peter M, MD;  Location: [place] [facility];  Service: Cardiovascular;  Laterality: N/A;   PROSTATECTOMY     TEE WITHOUT CARDIOVERSION N/A 01/19/2021   Procedure: TRANSESOPHAGEAL ECHOCARDIOGRAM (TEE);  Surgeon: Lajuana Matte, MD;  Location: [place];  Service: Open Heart Surgery;  Laterality: N/A;   TOTAL HIP ARTHROPLASTY Left 11/10/2015   Procedure: LEFT TOTAL HIP ARTHROPLASTY ANTERIOR APPROACH;  Surgeon: Mcarthur Rossetti, MD;  Location: [place];  Service: Orthopedics;  Laterality: Left;    Current Medications: Current Meds  Medication Sig   acetaminophen (TYLENOL) 500 MG tablet Take 1,000 mg by mouth every 8 (eight) hours as needed for mild pain.   amLODipine (NORVASC) 5 MG tablet Take 1 tablet (5 mg total) by mouth daily.   aspirin EC 81 MG tablet Take 81 mg by mouth daily. Swallow whole.   clonazePAM (KLONOPIN) 1 MG tablet TAKE 1 TABLET BY MOUTH 2 TIMES DAILY AS NEEDED FOR ANXIETY.   clopidogrel (PLAVIX) 75 MG tablet Take 1 tablet (75 mg total) by mouth daily. Stop taking plavix on Saturday Nov. 5,  2022   Eszopiclone 3 MG TABS TAKE 1 TABLET BY MOUTH EVERYDAY AT BEDTIME   Evolocumab (REPATHA SURECLICK) 086 MG/ML SOAJ Inject 140 mg into the skin every 14 (fourteen) days.   ezetimibe (ZETIA) 10 MG tablet TAKE 1 TABLET BY MOUTH EVERY DAY   metoprolol succinate (TOPROL-XL) 25 MG 24 hr tablet Take 0.5 tablets (12.5 mg total) by mouth daily. Take with or immediately following a meal. (Patient taking differently: Take 25 mg by mouth daily. Take with or immediately following a meal.)   nitroGLYCERIN (NITROSTAT) 0.4 MG SL tablet Place 1 tablet (0.4 mg total) under the tongue every 5 (five) minutes as needed for chest pain.   Omega-3 Fatty Acids (FISH OIL) 1200 MG CAPS Take 2,400 mg by mouth 2 (two) times daily.     Allergies:   Ranolazine, Crestor [rosuvastatin], Lipitor [atorvastatin],  Pravastatin, and Statins   Social History   Socioeconomic History   Marital status: Married    Spouse name: Not on file   Number of children: 0   Years of education: Not on file   Highest education level: Not on file  Occupational History   Occupation: Employed at [employer]  Tobacco Use   Smoking status: Never   Smokeless tobacco: Never  Vaping Use   Vaping Use: Never used  Substance and Sexual Activity   Alcohol use: Not Currently    Alcohol/week: 0.0 standard drinks    Comment: rarely   Drug use: No   Sexual activity: Yes    Partners: Female    Birth control/protection: None  Other Topics Concern   Not on file  Social History Narrative   Not on file   Social Determinants of Health   Financial Resource Strain: Not on file  Food Insecurity: Not on file  Transportation Needs: Not on file  Physical Activity: Not on file  Stress: Not on file  Social Connections: Not on file     Family History: The patient's family history includes Alzheimer's disease in his mother; Cancer in his maternal uncle and maternal uncle; Colon polyps in his brother; Diabetes in his sister; Heart attack in his maternal grandmother; Prostate cancer in his maternal uncle and maternal uncle; Stroke in his father. There is no history of Colon cancer, Esophageal cancer, Rectal cancer, or Stomach cancer.  ROS:   Please see the history of present illness.     EKGs/Labs/Other Studies Reviewed:    The following studies were reviewed today:  Event Monitor 07/15/2019 to 08/13/2019: The patient was monitored for 30 days from Jul 15, 2019 through August 13, 2019.  The predominant rhythm was sinus rhythm with an average rate at 65 bpm.  The slowest heart rate was sinus bradycardia at 42 bpm which occurred on May 16 at 10:40 AM.  The fastest heart rate was sinus tachycardia at 164 bpm which occurred on May 24 at 1:32 PM.  The patient had occasional PVCs with a ventricular couplet, as well as several episodes of  ventricular bigeminal rhythm.  There was an episode of 4 beats of nonsustained ventricular tachycardia on Jul 18, 2019 at 6:58 PM at 192 bpm.  There was a 36-second episode of supraventricular tachycardia with PACs at 174 bpm.  There were no pauses.  There was no episodes of atrial fibrillation. _______________  Left Cardiac Catheterization 12/24/2020:   Prox LAD to Mid LAD lesion is 75% stenosed.   1st Diag lesion is 80% stenosed.   Prox Cx to Mid Cx lesion is 100%  stenosed.   Prox RCA to Mid RCA lesion is 100% stenosed.   The left ventricular systolic function is normal.   LV end diastolic pressure is mildly elevated.   The left ventricular ejection fraction is 50-55% by visual estimate.   There is no aortic valve stenosis.   3 vessel obstructive CAD. 75% mid LAD, 80% first diagonal, 100% mid LCx and 100% proximal RCA. Compared to prior cath in March 2021 the LAD stenosis is new.  Mild LV dysfunction with inferobasal HK. EF 50% Mildly elevated LVEDP   Plan: needs CT surgery evaluation for CABG. Will hold Plavix. Plan to refer to Pharm D for PCSK 9 inhibitor.   Diagnostic Dominance: Right  _______________  Echocardiogram 01/15/2021: Impressions: 1. Left ventricular ejection fraction, by estimation, is 55%. The left  ventricle has normal function. The left ventricle demonstrates regional  wall motion abnormalities (see scoring diagram/findings for description).  There is mild left ventricular  hypertrophy. Left ventricular diastolic parameters are consistent with  Grade II diastolic dysfunction (pseudonormalization).   2. Right ventricular systolic function is normal. The right ventricular  size is normal.   3. Left atrial size was mildly dilated.   4. The mitral valve is grossly normal. Mild mitral valve regurgitation.  No evidence of mitral stenosis.   5. The aortic valve is tricuspid. There is mild calcification of the  aortic valve. Aortic valve regurgitation is not  visualized. No aortic  stenosis is present.   6. Aortic dilatation noted. There is mild dilatation of the ascending  aorta, measuring 41 mm.   7. The inferior vena cava is normal in size with greater than 50%  respiratory variability, suggesting right atrial pressure of 3 mmHg.  _______________  Pre-CABG Dopplers 01/15/2021: Summary:  - Right Carotid: Velocities in the right ICA are consistent with a 1-39%  stenosis.  - Left Carotid: Velocities in the left ICA are consistent with a 1-39%  stenosis.  - Vertebrals: Bilateral vertebral arteries demonstrate antegrade flow.   - Right Upper Extremity: Doppler waveforms remain within normal limits with right radial compression. Doppler waveforms remain within normal limits with right ulnar compression.  - Left Upper Extremity: Doppler waveforms remain within normal limits with left radial compression. Doppler waveforms decrease 50% with left ulnar compression.   EKG:  EKG ordered today. EKG personally reviewed and demonstrates normal sinus rhythm, rate 62 bpm, with PACs and sinus arrhythmia with T T wave inversions in lead aVL. Normal axis. Normal PR and QRS intervals. QTc automatically read as 485 ms but 445 ms on my personal calculation using Framingham formula.  Recent Labs: 01/20/2021: Magnesium 2.2 07/05/2021: ALT 21; BUN 20; Creatinine, Ser 1.13; Hemoglobin 16.8; Platelets 187; Potassium 4.9; Sodium 139; TSH 2.450  Recent Lipid Panel    Component Value Date/Time   CHOL 75 (L) 07/05/2021 0855   TRIG 94 07/05/2021 0855   HDL 36 (L) 07/05/2021 0855   CHOLHDL 2.1 07/05/2021 0855   CHOLHDL 4.8 06/06/2019 0612   VLDL 29 06/06/2019 0612   LDLCALC 20 07/05/2021 0855    Physical Exam:    Vital Signs: BP 138/78 (BP Location: Right Arm, Patient Position: Sitting)   Pulse 62   Ht '5\' 11"'$  (1.803 m)   Wt 225 lb 6.4 oz (102.2 kg)   SpO2 98%   BMI 31.44 kg/m     Wt Readings from Last 3 Encounters:  08/11/21 225 lb 6.4 oz (102.2 kg)   07/13/21 221 lb (100.2 kg)  06/21/21 222 lb (  100.7 kg)     General: 66 y.o. Caucasian male in no acute distress. HEENT: Normocephalic and atraumatic. Sclera clear.  Neck: Supple. No carotid bruits. No JVD. Heart: RRR. Distinct S1 and S2. No murmurs, gallops, or rubs. Radial and pulses 2+ and equal bilaterally. Lungs: No increased work of breathing. Clear to ausculation bilaterally. No wheezes, rhonchi, or rales.  Abdomen: Soft, non-distended, and non-tender to palpation. Extremities: No lower extremity edema.    Skin: Warm and dry. Neuro: Alert and oriented x3. No focal deficits. Psych: Normal affect. Responds appropriately.  Assessment:    1. Coronary artery disease involving native coronary artery of native heart without angina pectoris   2. S/P CABG (coronary artery bypass graft)   3. Post-op atrial fibrillation   4. Paroxysmal SVT (supraventricular tachycardia) ([place])   5. History of bradycardia   6. Primary hypertension   7. Hyperlipidemia, unspecified hyperlipidemia type   8. Pre-op evaluation     Plan:    CAD s/p CABG Chronic Angina History of CAD s/p DES  to CTO of RCA in 01/2021 and then repeat DES for in-stent restenosis in 05/2019. He had chronic angina following this and ultimately underwent CABG x4 LIMA to LAD, reverse SVG to PDA, reverse SVG to OM1, and reverse SVG to 1st Diag in 01/2021. - No chest pain.  - Continue DAPT with Aspirin and  Plavix. - Continue Amlodipine '5mg'$  daily and Toprol-XL 12.'5mg'$  daily. - Intolerant to multiple statins. Continue Zetia and Repatha.  Post-Op Atrial Fibrillation Following CABG. No documented recurrence so not on anticoagulation. He is now off Amiodarone. - Maintaining sinus rhythm. - Continue Toprol-XL 12.'5mg'$  daily.   Paroxsymal SVT History of Bradycardia Monitor in May/June 2021 showed underlying sinus rhythm with average heart rate of 65 bpm and occasional PVCs/ventricular couplets, one 4 beat episodes of non-sustained VT,  and a 36 second episode of SVT with rates in the 170s. He has also had some bradycardia with rates in the 40s in the past limiting up-titration of beta-blocker.  - Overall well controlled. - Continue Toprol-XL 12.'5mg'$  daily.   Hypertension BP mildly elevated. Initially 142/78 and then 138/78 on my personal recheck at the end of visit. - Continue Amlodipine '5mg'$  daily. - Continue Toprol-XL 12.'5mg'$  daily. - Asked patient to keep a BP/HR log for 2-3 weeks and then send this to [place] via MyChart. If BP consistently above goal of 130/80, will likely increase Amlodipine to '10mg'$  daily.  Hyperlipidemia Recent lipid panel on 07/05/2021: Total Cholesterol 75, Triglycerides 94, HDL 36, LDL 20. LDL goal <55.  - Intolerant to multiple statins in the past including Lipitor, Crestor, and Pravastatin. Continue Zetia '10mg'$  daily and Repatha.   Pre-Op Evaluation Patient has upcoming anterior cervical fusion planned. He is doing well from a cardiac standpoint with no angina, shortness of breath, acute CHF symptoms, palpitations, syncope. Able to complete >4.0 METS of physical activity. Therefore, based on ACC/AHA guidelines, patient would be at acceptable risk for the planned procedure without further cardiovascular testing. Patient was recently given the OK to hold Aspirin and Plavix for 5-7 days prior to cervical spine injection. No new cardiac events since that time so same recommendation can be used. Please restart both as soon as safely possible postoperatively. I will route this recommendation to the requesting party via Epic fax function.   Disposition: Follow up in 6 months.   Medication Adjustments/Labs and Tests Ordered: Current medicines are reviewed at length with the patient today.  Concerns regarding medicines are outlined  above.  Orders Placed This Encounter  Procedures   EKG 12-Lead   No orders of the defined types were placed in this encounter.   Patient Instructions  Medication Instructions:   STOP Asprin & Plavix 7 days prior to surgery.  *If you need a refill on your cardiac medications before your next appointment, please call your pharmacy*   Lab Work: NONE ordered at this time of appointment   If you have labs (blood work) drawn today and your tests are completely normal, you will receive your results only by: [place] (if you have MyChart) OR A paper copy in the mail If you have any lab test that is abnormal or we need to change your treatment, we will call you to review the results.   Testing/Procedures: NONE ordered at this time of appointment     Follow-Up: At [facility], you and your health needs are our priority.  As part of our continuing mission to provide you with exceptional heart care, we have created designated Provider Care Teams.  These Care Teams include your primary Cardiologist (physician) and Advanced Practice Providers (APPs -  Physician Assistants and Nurse Practitioners) who all work together to provide you with the care you need, when you need it.  We recommend signing up for the patient portal called "MyChart".  Sign up information is provided on this After Visit Summary.  MyChart is used to connect with patients for Virtual Visits (Telemedicine).  Patients are able to view lab/test results, encounter notes, upcoming appointments, etc.  Non-urgent messages can be sent to your provider as well.   To learn more about what you can do with MyChart, go to [email or web address].    Your next appointment:   6 month(s)  The format for your next appointment:   In Person  Provider:   [name], MD  or Sande Rives, PA-C        Other Instructions Blood Pressure log given. Please monitor BP and Heart rate for 2 weeks and mychart results back to our office.   Important Information About Sugar         Signed, Eppie Gibson  08/11/2021 4:33 PM    [facility]

## 2021-08-04 DIAGNOSIS — G992 Myelopathy in diseases classified elsewhere: Secondary | ICD-10-CM | POA: Diagnosis not present

## 2021-08-04 DIAGNOSIS — M4802 Spinal stenosis, cervical region: Secondary | ICD-10-CM | POA: Diagnosis not present

## 2021-08-05 ENCOUNTER — Telehealth: Payer: Self-pay

## 2021-08-05 NOTE — Telephone Encounter (Signed)
   Pre-operative Risk Assessment    Patient Name: Jacob Collier  DOB: Sep 06, 1955 MRN: [id number]      Request for Surgical Clearance    Procedure:   C5-6,C6-7 Anterior Cervical Fusion  Date of Surgery:  Clearance TBD                                 Surgeon:  Dr. Cooper Render. Pool Surgeon's Group or Practice Name:  [facility] Phone number:  [phone] Fax number:  [phone]   Type of Clearance Requested:   - Pharmacy:  Hold        Type of Anesthesia:  General    Additional requests/questions:  Please fax form back along with any office notes to [phone] Attn: Dorene Sorrow, Erven Ramson E Sul[place]ita [place]   08/05/2021, 2:58 PM

## 2021-08-05 NOTE — Telephone Encounter (Signed)
Patient was previously cleared for neck injection and hold aspirin and the Plavix for 7 days prior to the procedure, he will need a phone call, as long as his overall condition is unchanged, plan to clear for upcoming surgery as well.

## 2021-08-06 NOTE — Telephone Encounter (Signed)
Left a message for the patient to call back and speak to the on-call preop APP of the day 

## 2021-08-09 NOTE — Telephone Encounter (Signed)
Patient is scheduled to be seen in the clinic in 2 days, will defer final cardiac clearance to APP

## 2021-08-11 ENCOUNTER — Ambulatory Visit (INDEPENDENT_AMBULATORY_CARE_PROVIDER_SITE_OTHER): Payer: Managed Care, Other (non HMO) | Admitting: Student

## 2021-08-11 ENCOUNTER — Encounter: Payer: Self-pay | Admitting: Student

## 2021-08-11 VITALS — BP 138/78 | HR 62 | Ht 71.0 in | Wt 225.4 lb

## 2021-08-11 DIAGNOSIS — E785 Hyperlipidemia, unspecified: Secondary | ICD-10-CM

## 2021-08-11 DIAGNOSIS — I2510 Atherosclerotic heart disease of native coronary artery without angina pectoris: Secondary | ICD-10-CM

## 2021-08-11 DIAGNOSIS — Z01818 Encounter for other preprocedural examination: Secondary | ICD-10-CM

## 2021-08-11 DIAGNOSIS — Z87898 Personal history of other specified conditions: Secondary | ICD-10-CM | POA: Diagnosis not present

## 2021-08-11 DIAGNOSIS — Z951 Presence of aortocoronary bypass graft: Secondary | ICD-10-CM

## 2021-08-11 DIAGNOSIS — I480 Paroxysmal atrial fibrillation: Secondary | ICD-10-CM | POA: Diagnosis not present

## 2021-08-11 DIAGNOSIS — I471 Supraventricular tachycardia, unspecified: Secondary | ICD-10-CM

## 2021-08-11 DIAGNOSIS — I10 Essential (primary) hypertension: Secondary | ICD-10-CM

## 2021-08-11 NOTE — Patient Instructions (Addendum)
Medication Instructions:  STOP Asprin & Plavix 7 days prior to surgery.  *If you need a refill on your cardiac medications before your next appointment, please call your pharmacy*   Lab Work: NONE ordered at this time of appointment   If you have labs (blood work) drawn today and your tests are completely normal, you will receive your results only by: [place] (if you have MyChart) OR A paper copy in the mail If you have any lab test that is abnormal or we need to change your treatment, we will call you to review the results.   Testing/Procedures: NONE ordered at this time of appointment     Follow-Up: At [facility], you and your health needs are our priority.  As part of our continuing mission to provide you with exceptional heart care, we have created designated Provider Care Teams.  These Care Teams include your primary Cardiologist (physician) and Advanced Practice Providers (APPs -  Physician Assistants and Nurse Practitioners) who all work together to provide you with the care you need, when you need it.  We recommend signing up for the patient portal called "MyChart".  Sign up information is provided on this After Visit Summary.  MyChart is used to connect with patients for Virtual Visits (Telemedicine).  Patients are able to view lab/test results, encounter notes, upcoming appointments, etc.  Non-urgent messages can be sent to your provider as well.   To learn more about what you can do with MyChart, go to [email or web address].    Your next appointment:   6 month(s)  The format for your next appointment:   In Person  Provider:   [name], MD  or Sande Rives, PA-C        Other Instructions Blood Pressure log given. Please monitor BP and Heart rate for 2 weeks and mychart results back to our office.   Important Information About Sugar

## 2021-08-17 ENCOUNTER — Other Ambulatory Visit: Payer: Self-pay | Admitting: Neurosurgery

## 2021-08-19 ENCOUNTER — Telehealth: Payer: Self-pay | Admitting: Student

## 2021-08-19 NOTE — Telephone Encounter (Signed)
He can increase his Amlodipine to '10mg'$  daily. Thanks!

## 2021-08-19 NOTE — Telephone Encounter (Signed)
Patient is calling to let Sande Rives or nurse know that his BP has been running between 136/141/95. Wants a call to see what options he have to get his BP down.

## 2021-08-21 ENCOUNTER — Other Ambulatory Visit: Payer: Self-pay | Admitting: Legal Medicine

## 2021-08-21 DIAGNOSIS — F5101 Primary insomnia: Secondary | ICD-10-CM

## 2021-08-23 ENCOUNTER — Telehealth: Payer: Self-pay | Admitting: Physical Medicine and Rehabilitation

## 2021-08-23 NOTE — Telephone Encounter (Signed)
Pt called requesting to cancel appt. Pt is having surgery so appt is not needed. Pt phone number is [phone]. No call back needed

## 2021-08-24 ENCOUNTER — Encounter: Payer: Managed Care, Other (non HMO) | Admitting: Physical Medicine and Rehabilitation

## 2021-08-24 MED ORDER — AMLODIPINE BESYLATE 10 MG PO TABS: 10.0000 mg | ORAL_TABLET | Freq: Every day | ORAL | 6 refills | Status: DC

## 2021-08-24 NOTE — Telephone Encounter (Signed)
Left detailed message as noted for patient (DPR), call back w/any questions  

## 2021-08-25 NOTE — Pre-Procedure Instructions (Signed)
Surgical Instructions    Your procedure is scheduled on September 03, 2021.  Report to [facility] Main Entrance "A" at 8:30 A.M., then check in with the Admitting office.  Call this number if you have problems the morning of surgery:  [phone]   If you have any questions prior to your surgery date call [phone]: Open Monday-Friday 8am-4pm    Remember:  Do not eat or drink after midnight the night before your surgery    Take these medicines the morning of surgery with A SIP OF WATER:  amLODipine (NORVASC)   ezetimibe (ZETIA)   metoprolol succinate (TOPROL-XL)    Take these medicines the morning of surgery AS NEEDED:  acetaminophen (TYLENOL)   clonazePAM (KLONOPIN)   Follow your surgeon's instructions on when to stop Aspirin and Plavix.  If no instructions were given by your surgeon then you will need to call the office to get those instructions.    As of today, STOP taking any Aleve, Naproxen, Ibuprofen, Motrin, Advil, Goody's, BC's, all herbal medications, fish oil, and all vitamins.                     Do NOT Smoke (Tobacco/Vaping) for 24 hours prior to your procedure.  If you use a CPAP at night, you may bring your mask/headgear for your overnight stay.   Contacts, glasses, piercing's, hearing aid's, dentures or partials may not be worn into surgery, please bring cases for these belongings.    For patients admitted to the hospital, discharge time will be determined by your treatment team.   Patients discharged the day of surgery will not be allowed to drive home, and someone needs to stay with them for 24 hours.  SURGICAL WAITING ROOM VISITATION Patients having surgery or a procedure may have two support people in the waiting room. These visitors may be switched out with other visitors if needed. Children under the age of 27 must have an adult accompany them who is not the patient. If the patient needs to stay at the hospital during part of their recovery, the visitor  guidelines for inpatient rooms apply.  Please refer to the [facility] website for the visitor guidelines for Inpatients (after your surgery is over and you are in a regular room).    Special instructions:   [place]- Preparing For Surgery  Before surgery, you can play an important role. Because skin is not sterile, your skin needs to be as free of germs as possible. You can reduce the number of germs on your skin by washing with CHG (chlorahexidine gluconate) Soap before surgery.  CHG is an antiseptic cleaner which kills germs and bonds with the skin to continue killing germs even after washing.    Oral Hygiene is also important to reduce your risk of infection.  Remember - BRUSH YOUR TEETH THE MORNING OF SURGERY WITH YOUR REGULAR TOOTHPASTE  Please do not use if you have an allergy to CHG or antibacterial soaps. If your skin becomes reddened/irritated stop using the CHG.  Do not shave (including legs and underarms) for at least 48 hours prior to first CHG shower. It is OK to shave your face.  Please follow these instructions carefully.   Shower the NIGHT BEFORE SURGERY and the MORNING OF SURGERY  If you chose to wash your hair, wash your hair first as usual with your normal shampoo.  After you shampoo, rinse your hair and body thoroughly to remove the shampoo.  Use CHG Soap as  you would any other liquid soap. You can apply CHG directly to the skin and wash gently with a scrungie or a clean washcloth.   Apply the CHG Soap to your body ONLY FROM THE NECK DOWN.  Do not use on open wounds or open sores. Avoid contact with your eyes, ears, mouth and genitals (private parts). Wash Face and genitals (private parts)  with your normal soap.   Wash thoroughly, paying special attention to the area where your surgery will be performed.  Thoroughly rinse your body with warm water from the neck down.  DO NOT shower/wash with your normal soap after using and rinsing off the CHG Soap.  Pat  yourself dry with a CLEAN TOWEL.  Wear CLEAN PAJAMAS to bed the night before surgery  Place CLEAN SHEETS on your bed the night before your surgery  DO NOT SLEEP WITH PETS.   Day of Surgery: Take a shower with CHG soap.  Do not wear jewelry or makeup Do not wear lotions, powders, perfumes/colognes, or deodorant. Do not shave 48 hours prior to surgery.  Men may shave face and neck. Do not bring valuables to the hospital.  [facility] is not responsible for any belongings or valuables. Do not wear nail polish, gel polish, artificial nails, or any other type of covering on natural nails (fingers and toes) If you have artificial nails or gel coating that need to be removed by a nail salon, please have this removed prior to surgery. Artificial nails or gel coating may interfere with anesthesia's ability to adequately monitor your vital signs.  Wear Clean/Comfortable clothing the morning of surgery  Remember to brush your teeth WITH YOUR REGULAR TOOTHPASTE.   Please read over the following fact sheets that you were given.    If you received a COVID test during your pre-op visit  it is requested that you wear a mask when out in public, stay away from anyone that may not be feeling well and notify your surgeon if you develop symptoms. If you have been in contact with anyone that has tested positive in the last 10 days please notify you surgeon.

## 2021-08-26 ENCOUNTER — Encounter (HOSPITAL_COMMUNITY)
Admission: RE | Admit: 2021-08-26 | Discharge: 2021-08-26 | Disposition: A | Discharge status: Home / Self Care | Payer: Managed Care, Other (non HMO) | Source: Ambulatory Visit | Attending: Neurosurgery | Admitting: Neurosurgery

## 2021-08-26 ENCOUNTER — Other Ambulatory Visit: Payer: Self-pay

## 2021-08-26 ENCOUNTER — Encounter (HOSPITAL_COMMUNITY): Payer: Self-pay

## 2021-08-26 VITALS — BP 145/82 | HR 54 | Temp 98.0°F | Resp 17 | Ht 71.0 in | Wt 222.9 lb

## 2021-08-26 DIAGNOSIS — Z7902 Long term (current) use of antithrombotics/antiplatelets: Secondary | ICD-10-CM | POA: Insufficient documentation

## 2021-08-26 DIAGNOSIS — Z01812 Encounter for preprocedural laboratory examination: Secondary | ICD-10-CM | POA: Diagnosis present

## 2021-08-26 DIAGNOSIS — Z951 Presence of aortocoronary bypass graft: Secondary | ICD-10-CM | POA: Insufficient documentation

## 2021-08-26 DIAGNOSIS — I10 Essential (primary) hypertension: Secondary | ICD-10-CM | POA: Diagnosis not present

## 2021-08-26 DIAGNOSIS — Z01818 Encounter for other preprocedural examination: Secondary | ICD-10-CM

## 2021-08-26 DIAGNOSIS — I2510 Atherosclerotic heart disease of native coronary artery without angina pectoris: Secondary | ICD-10-CM | POA: Insufficient documentation

## 2021-08-26 HISTORY — DX: Anxiety disorder, unspecified: F41.9

## 2021-08-26 LAB — CBC
HCT: 54.9 % — ABNORMAL HIGH (ref 39.0–52.0)
Hemoglobin: 18.4 g/dL — ABNORMAL HIGH (ref 13.0–17.0)
MCH: 30 pg (ref 26.0–34.0)
MCHC: 33.5 g/dL (ref 30.0–36.0)
MCV: 89.4 fL (ref 80.0–100.0)
Platelets: 185 10*3/uL (ref 150–400)
RBC: 6.14 MIL/uL — ABNORMAL HIGH (ref 4.22–5.81)
RDW: 15.5 % (ref 11.5–15.5)
WBC: 7.7 10*3/uL (ref 4.0–10.5)
nRBC: 0 % (ref 0.0–0.2)

## 2021-08-26 LAB — BASIC METABOLIC PANEL
Anion gap: 8 (ref 5–15)
BUN: 16 mg/dL (ref 8–23)
CO2: 24 mmol/L (ref 22–32)
Calcium: 9.3 mg/dL (ref 8.9–10.3)
Chloride: 104 mmol/L (ref 98–111)
Creatinine, Ser: 1.21 mg/dL (ref 0.61–1.24)
GFR, Estimated: >60 mL/min (ref >60)
Glucose, Bld: 92 mg/dL (ref 70–99)
Potassium: 4.5 mmol/L (ref 3.5–5.1)
Sodium: 136 mmol/L (ref 135–145)

## 2021-08-26 LAB — SURGICAL PCR SCREEN
MRSA, PCR: NEGATIVE
Staphylococcus aureus: NEGATIVE

## 2021-08-26 NOTE — Pre-Procedure Instructions (Addendum)
Surgical Instructions    Your procedure is scheduled on September 03, 2021.  Report to [facility] Main Entrance "A" at 8:30 A.M., then check in with the Admitting office.  Call this number if you have problems the morning of surgery:  [phone]   If you have any questions prior to your surgery date call [phone]: Open Monday-Friday 8am-4pm    Remember:  Do not eat or drink after midnight the night before your surgery    Take these medicines the morning of surgery with A SIP OF WATER:  amLODipine (NORVASC)   ezetimibe (ZETIA)   metoprolol succinate (TOPROL-XL)    Take these medicines the morning of surgery AS NEEDED:  acetaminophen (TYLENOL)   clonazePAM (KLONOPIN)  Stop taking Aspirin & Plavix 5-7 days prior to your surgery.   As of today, STOP taking any Aleve, Naproxen, Ibuprofen, Motrin, Advil, Goody's, BC's, all herbal medications, fish oil, and all vitamins.                     Do NOT Smoke (Tobacco/Vaping) for 24 hours prior to your procedure.  If you use a CPAP at night, you may bring your mask/headgear for your overnight stay.   Contacts, glasses, piercing's, hearing aid's, dentures or partials may not be worn into surgery, please bring cases for these belongings.    For patients admitted to the hospital, discharge time will be determined by your treatment team.   Patients discharged the day of surgery will not be allowed to drive home, and someone needs to stay with them for 24 hours.  SURGICAL WAITING ROOM VISITATION Patients having surgery or a procedure may have two support people in the waiting room. These visitors may be switched out with other visitors if needed. Children under the age of 75 must have an adult accompany them who is not the patient. If the patient needs to stay at the hospital during part of their recovery, the visitor guidelines for inpatient rooms apply.  Please refer to the [facility] website for the visitor guidelines for Inpatients  (after your surgery is over and you are in a regular room).    Special instructions:   [place]- Preparing For Surgery  Before surgery, you can play an important role. Because skin is not sterile, your skin needs to be as free of germs as possible. You can reduce the number of germs on your skin by washing with CHG (chlorahexidine gluconate) Soap before surgery.  CHG is an antiseptic cleaner which kills germs and bonds with the skin to continue killing germs even after washing.    Oral Hygiene is also important to reduce your risk of infection.  Remember - BRUSH YOUR TEETH THE MORNING OF SURGERY WITH YOUR REGULAR TOOTHPASTE  Please do not use if you have an allergy to CHG or antibacterial soaps. If your skin becomes reddened/irritated stop using the CHG.  Do not shave (including legs and underarms) for at least 48 hours prior to first CHG shower. It is OK to shave your face.  Please follow these instructions carefully.   Shower the NIGHT BEFORE SURGERY and the MORNING OF SURGERY  If you chose to wash your hair, wash your hair first as usual with your normal shampoo.  After you shampoo, rinse your hair and body thoroughly to remove the shampoo.  Use CHG Soap as you would any other liquid soap. You can apply CHG directly to the skin and wash gently with a scrungie or a clean  washcloth.   Apply the CHG Soap to your body ONLY FROM THE NECK DOWN.  Do not use on open wounds or open sores. Avoid contact with your eyes, ears, mouth and genitals (private parts). Wash Face and genitals (private parts)  with your normal soap.   Wash thoroughly, paying special attention to the area where your surgery will be performed.  Thoroughly rinse your body with warm water from the neck down.  DO NOT shower/wash with your normal soap after using and rinsing off the CHG Soap.  Pat yourself dry with a CLEAN TOWEL.  Wear CLEAN PAJAMAS to bed the night before surgery  Place CLEAN SHEETS on your bed the  night before your surgery  DO NOT SLEEP WITH PETS.   Day of Surgery: Take a shower with CHG soap.  Do not wear jewelry  Do not wear lotions, powders, colognes, or deodorant. Men may shave face and neck. Do not bring valuables to the hospital.  [facility] is not responsible for any belongings or valuables.  Wear Clean/Comfortable clothing the morning of surgery  Remember to brush your teeth WITH YOUR REGULAR TOOTHPASTE.   Please read over the following fact sheets that you were given.

## 2021-08-26 NOTE — Progress Notes (Signed)
PCP - [name] Cardiologist - [name] Received cardiac clearance on 08/11/21  Chest x-ray - 06/07/21 EKG - 08/11/21 Stress Test - 07/12/16 ECHO - 01/15/21 Cardiac Cath - 12/24/20   Blood Thinner Instructions: Stop ASA & Plavix 5-7 days prior to surgery Last dose will be on Sunday 6/25, per patient   Anesthesia review: yes, heart history Received cardiac clearance on 08/11/21  Patient denies shortness of breath, fever, cough and chest pain at PAT appointment   All instructions explained to the patient, with a verbal understanding of the material. Patient agrees to go over the instructions while at home for a better understanding. Patient also instructed to self quarantine after being tested for COVID-19. The opportunity to ask questions was provided.

## 2021-08-27 NOTE — Progress Notes (Signed)
 Anesthesia Chart Review:  Follows with cardiology for history of CAD s/p DES to RCA in 01/2019 and then CABG x4 (LIMA to LAD, reverse SVG to PDA, reverse SVG to OM1, and reverse SVG to 1st Diag) in 01/2021, left pleural effusion following CABG s/p thoracentesis, post-op atrial fibrillation not on anticoagulation, paroxysmal SVT noted on monitor in 07/2019, hypertension, hyperlipidemia intolerant to statins.  Last seen by Sharren Decree, PA-C 08/11/2021 for preop evaluation.  Need to be doing very well from cardiac standpoint.  Per note, "Patient has upcoming anterior cervical fusion planned. He is doing well from a cardiac standpoint with no angina, shortness of breath, acute CHF symptoms, palpitations, syncope. Able to complete >4.0 METS of physical activity. Therefore, based on ACC/AHA guidelines, patient would be at acceptable risk for the planned procedure without further cardiovascular testing. Patient was recently given the OK to hold Aspirin  and Plavix  for 5-7 days prior to cervical spine injection. No new cardiac events since that time so same recommendation can be used. Please restart both as soon as safely possible postoperatively. I will route this recommendation to the requesting party via Epic fax function."  Patient reports last dose Plavix  08/29/2021.  Preop labs reviewed, hemoglobin elevated at 18.4 (history of polycythemia), otherwise unremarkable.  EKG 08/11/2021 (read per cardiology note same date): EKG ordered today. EKG personally reviewed and demonstrates normal sinus rhythm, rate 62 bpm, with PACs and sinus arrhythmia with T T wave inversions in lead aVL. Normal axis. Normal PR and QRS intervals. QTc automatically read as 485 ms but 445 ms on my personal calculation using Framingham formula.  Echocardiogram 01/15/2021: Impressions: 1. Left ventricular ejection fraction, by estimation, is 55%. The left  ventricle has normal function. The left ventricle demonstrates regional  wall  motion abnormalities (see scoring diagram/findings for description).  There is mild left ventricular  hypertrophy. Left ventricular diastolic parameters are consistent with  Grade II diastolic dysfunction (pseudonormalization).   2. Right ventricular systolic function is normal. The right ventricular  size is normal.   3. Left atrial size was mildly dilated.   4. The mitral valve is grossly normal. Mild mitral valve regurgitation.  No evidence of mitral stenosis.   5. The aortic valve is tricuspid. There is mild calcification of the  aortic valve. Aortic valve regurgitation is not visualized. No aortic  stenosis is present.   6. Aortic dilatation noted. There is mild dilatation of the ascending  aorta, measuring 41 mm.   7. The inferior vena cava is normal in size with greater than 50%  respiratory variability, suggesting right atrial pressure of 3 mmHg.    Edilia Gordon [facility] Short Stay Center/Anesthesiology Phone [phone] 08/27/2021 4:22 PM

## 2021-08-27 NOTE — Anesthesia Preprocedure Evaluation (Addendum)
Anesthesia Evaluation  Patient identified by MRN, date of birth, ID band Patient awake    Reviewed: Allergy & Precautions, NPO status , Patient's Chart, lab work & pertinent test results  Airway Mallampati: II  TM Distance: >3 FB Neck ROM: Full    Dental  (+) Dental Advisory Given   Pulmonary neg shortness of breath, pneumonia, resolved,    Pulmonary exam normal breath sounds clear to auscultation       Cardiovascular hypertension, (-) angina+ CAD, + Cardiac Stents and + CABG  Normal cardiovascular exam+ Valvular Problems/Murmurs  Rhythm:Regular Rate:Normal  Echo 01/2021 1. Left ventricular ejection fraction, by estimation, is 55%. The left ventricle has normal function. The left ventricle demonstrates regional wall motion abnormalities (see scoring diagram/findings for description). There is mild left ventricular hypertrophy. Left ventricular diastolic parameters are consistent with Grade II diastolic dysfunction (pseudonormalization).  2. Right ventricular systolic function is normal. The right ventricular size is normal.  3. Left atrial size was mildly dilated.  4. The mitral valve is grossly normal. Mild mitral valve regurgitation. No evidence of mitral stenosis.  5. The aortic valve is tricuspid. There is mild calcification of the aortic valve. Aortic valve regurgitation is not visualized. No aortic stenosis is present.  6. Aortic dilatation noted. There is mild dilatation of the ascending aorta, measuring 41 mm.  7. The inferior vena cava is normal in size with greater than 50% respiratory variability, suggesting right atrial pressure of 3 mmHg.    Neuro/Psych PSYCHIATRIC DISORDERS Anxiety negative neurological ROS     GI/Hepatic negative GI ROS, Neg liver ROS,   Endo/Other  negative endocrine ROS  Renal/GU negative Renal ROS     Musculoskeletal  (+) Arthritis ,   Abdominal (+) + obese,   Peds   Hematology negative hematology ROS (+)   Anesthesia Other Findings   Reproductive/Obstetrics                           Anesthesia Physical Anesthesia Plan  ASA: 3  Anesthesia Plan: General   Post-op Pain Management: Tylenol PO (pre-op)* and Gabapentin PO (pre-op)*   Induction: Intravenous  PONV Risk Score and Plan: 3 and Ondansetron, Dexamethasone, Treatment may vary due to age or medical condition and Midazolam  Airway Management Planned: Oral ETT  Additional Equipment: ClearSight  Intra-op Plan:   Post-operative Plan: Extubation in OR  Informed Consent: I have reviewed the patients History and Physical, chart, labs and discussed the procedure including the risks, benefits and alternatives for the proposed anesthesia with the patient or authorized representative who has indicated his/her understanding and acceptance.     Dental advisory given  Plan Discussed with: CRNA  Anesthesia Plan Comments: (PAT note by Karoline Caldwell, PA-C: Follows with cardiology for history of CAD s/p DES to RCA in 11/2020and then CABG x4 (LIMA to LAD, reverse SVG to PDA, reverse SVG to OM1, and reverse SVG to 1st Diag) in 01/2021, left pleural effusion following CABG s/p thoracentesis, post-op atrial fibrillation not on anticoagulation,paroxysmal SVT notedon monitor in 07/2019, hypertension, hyperlipidemiaintolerant to statins.  Last seen by Sande Rives, PA-C 08/11/2021 for preop evaluation.  Need to be doing very well from cardiac standpoint.  Per note, "Patient has upcoming anterior cervical fusion planned. He is doing well from a cardiac standpoint with no angina, shortness of breath, acute CHF symptoms, palpitations, syncope. Able to complete >4.0 METS of physical activity.Therefore, based on ACC/AHA guidelines, patient would be at acceptable risk for  the planned procedure without further cardiovascular testing.Patient was recently given the OK to hold Aspirin and Plavix  for5-7 days prior to cervical spine injection. No new cardiac events since that time so same recommendation can be used. Please restart both as soon as safely possible postoperatively.I will route this recommendation to the requesting party via Epic fax function."  Patient reports last dose Plavix 08/29/2021.  Preop labs reviewed, hemoglobin elevated at 18.4 (history of polycythemia), otherwise unremarkable.  EKG 08/11/2021 (read per cardiology note same date): EKG ordered today. EKG personally reviewed and demonstrates normal sinus rhythm, rate 62 bpm, with PACs and sinus arrhythmia with T T wave inversions in lead aVL. Normal axis. Normal PR and QRS intervals.QTc automatically read as 485 ms but 445 ms on my personal calculation using Framingham formula.  Echocardiogram 01/15/2021: Impressions: 1. Left ventricular ejection fraction, by estimation, is 55%. The left  ventricle has normal function. The left ventricle demonstrates regional  wall motion abnormalities (see scoring diagram/findings for description).  There is mild left ventricular hypertrophy. Left ventricular diastolic parameters are consistent with  Grade II diastolic dysfunction (pseudonormalization).  2. Right ventricular systolic function is normal. The right ventricular  size is normal.  3. Left atrial size was mildly dilated.  4. The mitral valve is grossly normal. Mild mitral valve regurgitation.  No evidence of mitral stenosis.  5. The aortic valve is tricuspid. There is mild calcification of the  aortic valve. Aortic valve regurgitation is not visualized. No aortic  stenosis is present.  6. Aortic dilatation noted. There is mild dilatation of the ascending  aorta, measuring 41 mm.  7. The inferior vena cava is normal in size with greater than 50%  respiratory variability, suggesting right atrial pressure of 3 mmHg.   )      Anesthesia Quick Evaluation

## 2021-09-03 ENCOUNTER — Other Ambulatory Visit: Payer: Self-pay

## 2021-09-03 ENCOUNTER — Ambulatory Visit (HOSPITAL_BASED_OUTPATIENT_CLINIC_OR_DEPARTMENT_OTHER): Payer: Managed Care, Other (non HMO) | Admitting: Anesthesiology

## 2021-09-03 ENCOUNTER — Encounter (HOSPITAL_COMMUNITY): Payer: Self-pay | Admitting: Neurosurgery

## 2021-09-03 ENCOUNTER — Observation Stay (HOSPITAL_COMMUNITY)
Admission: RE | Admit: 2021-09-03 | Discharge: 2021-09-03 | Disposition: A | Discharge status: Home / Self Care | Payer: Managed Care, Other (non HMO) | Attending: Neurosurgery | Admitting: Neurosurgery

## 2021-09-03 ENCOUNTER — Ambulatory Visit (HOSPITAL_COMMUNITY): Payer: Managed Care, Other (non HMO)

## 2021-09-03 ENCOUNTER — Ambulatory Visit (HOSPITAL_COMMUNITY): Payer: Managed Care, Other (non HMO) | Admitting: Physician Assistant

## 2021-09-03 ENCOUNTER — Encounter (HOSPITAL_COMMUNITY)
Admission: RE | Disposition: A | Discharge status: Home / Self Care | Payer: Self-pay | Source: Home / Self Care | Attending: Neurosurgery

## 2021-09-03 DIAGNOSIS — M4802 Spinal stenosis, cervical region: Secondary | ICD-10-CM

## 2021-09-03 DIAGNOSIS — Z8546 Personal history of malignant neoplasm of prostate: Secondary | ICD-10-CM | POA: Diagnosis not present

## 2021-09-03 DIAGNOSIS — M4712 Other spondylosis with myelopathy, cervical region: Secondary | ICD-10-CM | POA: Diagnosis not present

## 2021-09-03 DIAGNOSIS — Z7982 Long term (current) use of aspirin: Secondary | ICD-10-CM | POA: Diagnosis not present

## 2021-09-03 DIAGNOSIS — I10 Essential (primary) hypertension: Secondary | ICD-10-CM

## 2021-09-03 DIAGNOSIS — G992 Myelopathy in diseases classified elsewhere: Secondary | ICD-10-CM | POA: Diagnosis not present

## 2021-09-03 DIAGNOSIS — Z981 Arthrodesis status: Secondary | ICD-10-CM | POA: Diagnosis not present

## 2021-09-03 DIAGNOSIS — Z7902 Long term (current) use of antithrombotics/antiplatelets: Secondary | ICD-10-CM | POA: Diagnosis not present

## 2021-09-03 DIAGNOSIS — M4322 Fusion of spine, cervical region: Secondary | ICD-10-CM | POA: Diagnosis not present

## 2021-09-03 DIAGNOSIS — Z96642 Presence of left artificial hip joint: Secondary | ICD-10-CM | POA: Diagnosis not present

## 2021-09-03 DIAGNOSIS — Z79899 Other long term (current) drug therapy: Secondary | ICD-10-CM | POA: Diagnosis not present

## 2021-09-03 DIAGNOSIS — Z951 Presence of aortocoronary bypass graft: Secondary | ICD-10-CM | POA: Insufficient documentation

## 2021-09-03 DIAGNOSIS — M4722 Other spondylosis with radiculopathy, cervical region: Secondary | ICD-10-CM | POA: Insufficient documentation

## 2021-09-03 DIAGNOSIS — G959 Disease of spinal cord, unspecified: Secondary | ICD-10-CM

## 2021-09-03 DIAGNOSIS — Z955 Presence of coronary angioplasty implant and graft: Secondary | ICD-10-CM | POA: Insufficient documentation

## 2021-09-03 DIAGNOSIS — I2510 Atherosclerotic heart disease of native coronary artery without angina pectoris: Secondary | ICD-10-CM | POA: Diagnosis not present

## 2021-09-03 HISTORY — PX: ANTERIOR CERVICAL DECOMP/DISCECTOMY FUSION: SHX1161

## 2021-09-03 SURGERY — ANTERIOR CERVICAL DECOMPRESSION/DISCECTOMY FUSION 2 LEVELS
Anesthesia: General | Site: Spine Cervical

## 2021-09-03 MED ORDER — GABAPENTIN 300 MG PO CAPS
300.0000 mg | ORAL_CAPSULE | Freq: Once | ORAL | Status: AC
  Administered 2021-09-03: 300 mg via ORAL
  Filled 2021-09-03: qty 1

## 2021-09-03 MED ORDER — HYDROCODONE-ACETAMINOPHEN 5-325 MG PO TABS: 1.0000 | ORAL_TABLET | ORAL | Status: DC | PRN

## 2021-09-03 MED ORDER — EPHEDRINE SULFATE-NACL 50-0.9 MG/10ML-% IV SOSY
PREFILLED_SYRINGE | INTRAVENOUS | Status: DC | PRN
  Administered 2021-09-03 (×2): 5 mg via INTRAVENOUS

## 2021-09-03 MED ORDER — PROPOFOL 10 MG/ML IV BOLUS
INTRAVENOUS | Status: AC
  Filled 2021-09-03: qty 20

## 2021-09-03 MED ORDER — CLOPIDOGREL BISULFATE 75 MG PO TABS: 75.0000 mg | ORAL_TABLET | Freq: Every day | ORAL | Status: DC

## 2021-09-03 MED ORDER — METOPROLOL SUCCINATE ER 25 MG PO TB24: 12.5000 mg | ORAL_TABLET | Freq: Two times a day (BID) | ORAL | Status: DC

## 2021-09-03 MED ORDER — THROMBIN 20000 UNITS EX SOLR: CUTANEOUS | Status: DC | PRN

## 2021-09-03 MED ORDER — AMISULPRIDE (ANTIEMETIC) 5 MG/2ML IV SOLN: 10.0000 mg | Freq: Once | INTRAVENOUS | Status: DC | PRN

## 2021-09-03 MED ORDER — ROCURONIUM BROMIDE 10 MG/ML (PF) SYRINGE
PREFILLED_SYRINGE | INTRAVENOUS | Status: AC
  Filled 2021-09-03: qty 10

## 2021-09-03 MED ORDER — PROPOFOL 10 MG/ML IV BOLUS
INTRAVENOUS | Status: DC | PRN
  Administered 2021-09-03: 160 mg via INTRAVENOUS

## 2021-09-03 MED ORDER — THROMBIN 20000 UNITS EX SOLR
CUTANEOUS | Status: AC
  Filled 2021-09-03: qty 20000

## 2021-09-03 MED ORDER — PHENYLEPHRINE 80 MCG/ML (10ML) SYRINGE FOR IV PUSH (FOR BLOOD PRESSURE SUPPORT)
PREFILLED_SYRINGE | INTRAVENOUS | Status: DC | PRN
  Administered 2021-09-03 (×2): 80 ug via INTRAVENOUS

## 2021-09-03 MED ORDER — HYDROMORPHONE HCL 1 MG/ML IJ SOLN
0.2500 mg | INTRAMUSCULAR | Status: DC | PRN
  Administered 2021-09-03 (×2): 0.5 mg via INTRAVENOUS

## 2021-09-03 MED ORDER — CHLORHEXIDINE GLUCONATE CLOTH 2 % EX PADS: 6.0000 | MEDICATED_PAD | Freq: Once | CUTANEOUS | Status: DC

## 2021-09-03 MED ORDER — ROCURONIUM BROMIDE 10 MG/ML (PF) SYRINGE
PREFILLED_SYRINGE | INTRAVENOUS | Status: DC | PRN
  Administered 2021-09-03 (×2): 20 mg via INTRAVENOUS
  Administered 2021-09-03: 30 mg via INTRAVENOUS
  Administered 2021-09-03: 60 mg via INTRAVENOUS
  Administered 2021-09-03: 10 mg via INTRAVENOUS

## 2021-09-03 MED ORDER — DEXAMETHASONE SODIUM PHOSPHATE 10 MG/ML IJ SOLN
INTRAMUSCULAR | Status: AC
  Filled 2021-09-03: qty 1

## 2021-09-03 MED ORDER — EPHEDRINE 5 MG/ML INJ
INTRAVENOUS | Status: AC
  Filled 2021-09-03: qty 5

## 2021-09-03 MED ORDER — LIDOCAINE 2% (20 MG/ML) 5 ML SYRINGE
INTRAMUSCULAR | Status: AC
  Filled 2021-09-03: qty 5

## 2021-09-03 MED ORDER — LIDOCAINE 2% (20 MG/ML) 5 ML SYRINGE
INTRAMUSCULAR | Status: DC | PRN
  Administered 2021-09-03: 100 mg via INTRAVENOUS

## 2021-09-03 MED ORDER — 0.9 % SODIUM CHLORIDE (POUR BTL) OPTIME
TOPICAL | Status: DC | PRN
  Administered 2021-09-03: 1000 mL

## 2021-09-03 MED ORDER — MIDAZOLAM HCL 2 MG/2ML IJ SOLN
INTRAMUSCULAR | Status: DC | PRN
  Administered 2021-09-03: 2 mg via INTRAVENOUS

## 2021-09-03 MED ORDER — DEXAMETHASONE SODIUM PHOSPHATE 10 MG/ML IJ SOLN
INTRAMUSCULAR | Status: DC | PRN
  Administered 2021-09-03: 10 mg via INTRAVENOUS

## 2021-09-03 MED ORDER — ACETAMINOPHEN 500 MG PO TABS
1000.0000 mg | ORAL_TABLET | Freq: Once | ORAL | Status: AC
  Administered 2021-09-03: 1000 mg via ORAL
  Filled 2021-09-03: qty 2

## 2021-09-03 MED ORDER — NITROGLYCERIN 0.4 MG SL SUBL: 0.4000 mg | SUBLINGUAL_TABLET | SUBLINGUAL | Status: DC | PRN

## 2021-09-03 MED ORDER — KETOROLAC TROMETHAMINE 30 MG/ML IJ SOLN
INTRAMUSCULAR | Status: AC
  Filled 2021-09-03: qty 1

## 2021-09-03 MED ORDER — CYCLOBENZAPRINE HCL 10 MG PO TABS: 10.0000 mg | ORAL_TABLET | Freq: Three times a day (TID) | ORAL | 0 refills | Status: DC | PRN

## 2021-09-03 MED ORDER — AMLODIPINE BESYLATE 5 MG PO TABS: 10.0000 mg | ORAL_TABLET | Freq: Every day | ORAL | Status: DC

## 2021-09-03 MED ORDER — SODIUM CHLORIDE 0.9 % IV SOLN
250.0000 mL | INTRAVENOUS | Status: DC
  Administered 2021-09-03: 250 mL via INTRAVENOUS

## 2021-09-03 MED ORDER — FENTANYL CITRATE (PF) 250 MCG/5ML IJ SOLN
INTRAMUSCULAR | Status: AC
  Filled 2021-09-03: qty 5

## 2021-09-03 MED ORDER — MEPERIDINE HCL 25 MG/ML IJ SOLN: 6.2500 mg | INTRAMUSCULAR | Status: DC | PRN

## 2021-09-03 MED ORDER — HYDROCODONE-ACETAMINOPHEN 10-325 MG PO TABS: 1.0000 | ORAL_TABLET | ORAL | 0 refills | Status: DC | PRN

## 2021-09-03 MED ORDER — ONDANSETRON HCL 4 MG/2ML IJ SOLN: 4.0000 mg | Freq: Four times a day (QID) | INTRAMUSCULAR | Status: DC | PRN

## 2021-09-03 MED ORDER — OMEGA 3 1000 MG PO CAPS: ORAL_CAPSULE | Freq: Two times a day (BID) | ORAL | Status: DC

## 2021-09-03 MED ORDER — CEFAZOLIN SODIUM-DEXTROSE 1-4 GM/50ML-% IV SOLN: 1.0000 g | Freq: Three times a day (TID) | INTRAVENOUS | Status: DC

## 2021-09-03 MED ORDER — ONDANSETRON HCL 4 MG/2ML IJ SOLN
INTRAMUSCULAR | Status: DC | PRN
  Administered 2021-09-03: 4 mg via INTRAVENOUS

## 2021-09-03 MED ORDER — ONDANSETRON HCL 4 MG/2ML IJ SOLN
INTRAMUSCULAR | Status: AC
  Filled 2021-09-03: qty 2

## 2021-09-03 MED ORDER — ACETAMINOPHEN 325 MG PO TABS: 650.0000 mg | ORAL_TABLET | ORAL | Status: DC | PRN

## 2021-09-03 MED ORDER — HYDROCODONE-ACETAMINOPHEN 10-325 MG PO TABS
1.0000 | ORAL_TABLET | ORAL | Status: DC | PRN
  Administered 2021-09-03: 1 via ORAL
  Filled 2021-09-03: qty 1

## 2021-09-03 MED ORDER — HYDROMORPHONE HCL 1 MG/ML IJ SOLN: 1.0000 mg | INTRAMUSCULAR | Status: DC | PRN

## 2021-09-03 MED ORDER — ASPIRIN 81 MG PO TBEC
81.0000 mg | DELAYED_RELEASE_TABLET | Freq: Every day | ORAL | Status: DC
  Administered 2021-09-03: 81 mg via ORAL
  Filled 2021-09-03: qty 1

## 2021-09-03 MED ORDER — PHENYLEPHRINE HCL-NACL 20-0.9 MG/250ML-% IV SOLN
INTRAVENOUS | Status: DC | PRN
  Administered 2021-09-03: 40 ug/min via INTRAVENOUS

## 2021-09-03 MED ORDER — HYDROMORPHONE HCL 1 MG/ML IJ SOLN
INTRAMUSCULAR | Status: AC
  Filled 2021-09-03: qty 1

## 2021-09-03 MED ORDER — CEFAZOLIN SODIUM-DEXTROSE 2-4 GM/100ML-% IV SOLN
2.0000 g | INTRAVENOUS | Status: AC
  Administered 2021-09-03: 2 g via INTRAVENOUS
  Filled 2021-09-03: qty 100

## 2021-09-03 MED ORDER — ONDANSETRON HCL 4 MG PO TABS: 4.0000 mg | ORAL_TABLET | Freq: Four times a day (QID) | ORAL | Status: DC | PRN

## 2021-09-03 MED ORDER — PHENOL 1.4 % MT LIQD: 1.0000 | OROMUCOSAL | Status: DC | PRN

## 2021-09-03 MED ORDER — OMEGA-3-ACID ETHYL ESTERS 1 G PO CAPS
1.0000 g | ORAL_CAPSULE | Freq: Two times a day (BID) | ORAL | Status: DC
  Administered 2021-09-03: 1 g via ORAL
  Filled 2021-09-03: qty 1

## 2021-09-03 MED ORDER — ORAL CARE MOUTH RINSE: 15.0000 mL | Freq: Once | OROMUCOSAL | Status: AC

## 2021-09-03 MED ORDER — MIDAZOLAM HCL 2 MG/2ML IJ SOLN
INTRAMUSCULAR | Status: AC
  Filled 2021-09-03: qty 2

## 2021-09-03 MED ORDER — ACETAMINOPHEN 650 MG RE SUPP: 650.0000 mg | RECTAL | Status: DC | PRN

## 2021-09-03 MED ORDER — CHLORHEXIDINE GLUCONATE 0.12 % MT SOLN
15.0000 mL | Freq: Once | OROMUCOSAL | Status: AC
  Administered 2021-09-03: 15 mL via OROMUCOSAL
  Filled 2021-09-03: qty 15

## 2021-09-03 MED ORDER — LACTATED RINGERS IV SOLN: INTRAVENOUS | Status: DC

## 2021-09-03 MED ORDER — SODIUM CHLORIDE 0.9% FLUSH: 3.0000 mL | INTRAVENOUS | Status: DC | PRN

## 2021-09-03 MED ORDER — SUGAMMADEX SODIUM 200 MG/2ML IV SOLN
INTRAVENOUS | Status: DC | PRN
  Administered 2021-09-03 (×2): 200 mg via INTRAVENOUS

## 2021-09-03 MED ORDER — MENTHOL 3 MG MT LOZG: 1.0000 | LOZENGE | OROMUCOSAL | Status: DC | PRN

## 2021-09-03 MED ORDER — EZETIMIBE 10 MG PO TABS
10.0000 mg | ORAL_TABLET | Freq: Every day | ORAL | Status: DC
  Administered 2021-09-03: 10 mg via ORAL
  Filled 2021-09-03: qty 1

## 2021-09-03 MED ORDER — SODIUM CHLORIDE 0.9% FLUSH
3.0000 mL | Freq: Two times a day (BID) | INTRAVENOUS | Status: DC
  Administered 2021-09-03: 3 mL via INTRAVENOUS

## 2021-09-03 MED ORDER — ZOLPIDEM TARTRATE 5 MG PO TABS: 5.0000 mg | ORAL_TABLET | Freq: Every evening | ORAL | Status: DC | PRN

## 2021-09-03 MED ORDER — CYCLOBENZAPRINE HCL 10 MG PO TABS
10.0000 mg | ORAL_TABLET | Freq: Three times a day (TID) | ORAL | Status: DC | PRN
  Administered 2021-09-03: 10 mg via ORAL
  Filled 2021-09-03: qty 1

## 2021-09-03 MED ORDER — LACTATED RINGERS IV SOLN: INTRAVENOUS | Status: DC | PRN

## 2021-09-03 MED ORDER — PHENYLEPHRINE 80 MCG/ML (10ML) SYRINGE FOR IV PUSH (FOR BLOOD PRESSURE SUPPORT)
PREFILLED_SYRINGE | INTRAVENOUS | Status: AC
  Filled 2021-09-03: qty 10

## 2021-09-03 MED ORDER — FENTANYL CITRATE (PF) 250 MCG/5ML IJ SOLN
INTRAMUSCULAR | Status: DC | PRN
Start: 2021-09-03 — End: 2021-09-03
  Administered 2021-09-03: 100 ug via INTRAVENOUS
  Administered 2021-09-03 (×2): 50 ug via INTRAVENOUS

## 2021-09-03 MED ORDER — CLONAZEPAM 0.5 MG PO TABS: 1.0000 mg | ORAL_TABLET | Freq: Two times a day (BID) | ORAL | Status: DC | PRN

## 2021-09-03 SURGICAL SUPPLY — 56 items
BAG COUNTER SPONGE SURGICOUNT (BAG) ×3 IMPLANT
BAND RUBBER #18 3X1/16 STRL (MISCELLANEOUS) ×4 IMPLANT
BENZOIN TINCTURE PRP APPL 2/3 (GAUZE/BANDAGES/DRESSINGS) ×2 IMPLANT
BIT DRILL 13 (BIT) ×1 IMPLANT
BUR MATCHSTICK NEURO 3.0 LAGG (BURR) ×2 IMPLANT
CAGE PEEK 7X14X11 (Cage) ×4 IMPLANT
CANISTER SUCT 3000ML PPV (MISCELLANEOUS) ×2 IMPLANT
CARTRIDGE OIL MAESTRO DRILL (MISCELLANEOUS) ×1 IMPLANT
DERMABOND ADVANCED (GAUZE/BANDAGES/DRESSINGS) ×1
DERMABOND ADVANCED .7 DNX12 (GAUZE/BANDAGES/DRESSINGS) IMPLANT
DIFFUSER DRILL AIR PNEUMATIC (MISCELLANEOUS) ×2 IMPLANT
DRAPE C-ARM 42X72 X-RAY (DRAPES) ×4 IMPLANT
DRAPE LAPAROTOMY 100X72 PEDS (DRAPES) ×2 IMPLANT
DRAPE MICROSCOPE LEICA (MISCELLANEOUS) ×2 IMPLANT
DURAPREP 6ML APPLICATOR 50/CS (WOUND CARE) ×2 IMPLANT
ELECT COATED BLADE 2.86 ST (ELECTRODE) ×2 IMPLANT
ELECT REM PT RETURN 9FT ADLT (ELECTROSURGICAL) ×2
ELECTRODE REM PT RTRN 9FT ADLT (ELECTROSURGICAL) ×1 IMPLANT
GAUZE SPONGE 4X4 12PLY STRL (GAUZE/BANDAGES/DRESSINGS) ×2 IMPLANT
GLOVE BIO SURGEON STRL SZ 6 (GLOVE) ×1 IMPLANT
GLOVE BIOGEL PI IND STRL 6.5 (GLOVE) IMPLANT
GLOVE BIOGEL PI IND STRL 7.5 (GLOVE) IMPLANT
GLOVE BIOGEL PI INDICATOR 6.5 (GLOVE) ×1
GLOVE BIOGEL PI INDICATOR 7.5 (GLOVE) ×2
GLOVE ECLIPSE 9.0 STRL (GLOVE) ×3 IMPLANT
GLOVE SURG SS PI 7.0 STRL IVOR (GLOVE) ×4 IMPLANT
GOWN STRL REUS W/ TWL LRG LVL3 (GOWN DISPOSABLE) IMPLANT
GOWN STRL REUS W/ TWL XL LVL3 (GOWN DISPOSABLE) ×1 IMPLANT
GOWN STRL REUS W/TWL 2XL LVL3 (GOWN DISPOSABLE) IMPLANT
GOWN STRL REUS W/TWL LRG LVL3 (GOWN DISPOSABLE) ×2
GOWN STRL REUS W/TWL XL LVL3 (GOWN DISPOSABLE) ×4
HALTER HD/CHIN CERV TRACTION D (MISCELLANEOUS) ×2 IMPLANT
KIT BASIN OR (CUSTOM PROCEDURE TRAY) ×2 IMPLANT
KIT TURNOVER KIT B (KITS) ×2 IMPLANT
NDL SPNL 20GX3.5 QUINCKE YW (NEEDLE) ×1 IMPLANT
NEEDLE SPNL 20GX3.5 QUINCKE YW (NEEDLE) ×2 IMPLANT
NS IRRIG 1000ML POUR BTL (IV SOLUTION) ×2 IMPLANT
OIL CARTRIDGE MAESTRO DRILL (MISCELLANEOUS) ×2
PACK LAMINECTOMY NEURO (CUSTOM PROCEDURE TRAY) ×2 IMPLANT
PAD ARMBOARD 7.5X6 YLW CONV (MISCELLANEOUS) ×6 IMPLANT
PLATE ELITE 42MM (Plate) ×1 IMPLANT
SCREW ST 13X4XST VA NS SPNE (Screw) IMPLANT
SCREW ST VAR 4 ATL (Screw) ×12 IMPLANT
SPACER SPNL 11X14X7XPEEK CVD (Cage) IMPLANT
SPCR SPNL 11X14X7XPEEK CVD (Cage) ×2 IMPLANT
SPONGE INTESTINAL PEANUT (DISPOSABLE) ×2 IMPLANT
SPONGE SURGIFOAM ABS GEL 100 (HEMOSTASIS) ×1 IMPLANT
STRIP CLOSURE SKIN 1/2X4 (GAUZE/BANDAGES/DRESSINGS) ×2 IMPLANT
SUT VIC AB 3-0 SH 8-18 (SUTURE) ×2 IMPLANT
SUT VIC AB 4-0 RB1 18 (SUTURE) ×2 IMPLANT
TAPE CLOTH 4X10 WHT NS (GAUZE/BANDAGES/DRESSINGS) ×2 IMPLANT
TAPE CLOTH SURG 4X10 WHT LF (GAUZE/BANDAGES/DRESSINGS) ×1 IMPLANT
TOWEL GREEN STERILE (TOWEL DISPOSABLE) ×2 IMPLANT
TOWEL GREEN STERILE FF (TOWEL DISPOSABLE) ×2 IMPLANT
TRAP SPECIMEN MUCUS 40CC (MISCELLANEOUS) ×2 IMPLANT
WATER STERILE IRR 1000ML POUR (IV SOLUTION) ×2 IMPLANT

## 2021-09-03 NOTE — Op Note (Signed)
Date of procedure: 09/03/2021  Date of dictation: Same  Service: Neurosurgery  Preoperative diagnosis: Cervical stenosis with myelopathy  Postoperative diagnosis: Same  Procedure Name: C5-6, C6-7 anterior cervical discectomy with interbody fusion utilizing interbody cages, local harvested autograft, and anterior plate instrumentation  Surgeon:Kyndra Condron A.Klarissa Mcilvain, M.D.  Asst. Surgeon: Reinaldo Meeker, NP  Anesthesia: General  Indication: 66 year old male with severe neck pain with radiating pain numbness and weakness into his upper and lower extremities with symptoms consistent with Armond's type phenomenon.  Work-up demonstrates evidence of a large posterior projecting disc osteophyte complex that C5-6 with severe cord compression and cord signal change.  He has severe stenosis also at the C6 level to a lesser degree. The cervical spine demonstrates some spondylitic change but no other areas of stenosis or nerve root compression.  Patient presents now for two-level anterior cervical decompression and fusion in hopes of improving his symptoms.  Operative note: After induction of anesthesia, patient positioned supine with neck slightly extended and held placed halter traction.  Patient's anterior cervical region prepped draped sterilely.  Incision made overlying C6.  Dissection performed on the right.  Retractor placed.  Fluoroscopy used.  Levels confirmed.  The spaces at C5-6 and C6-7 were incised.  Discectomy was then performed using various instruments down to level the posterior annulus.  Microscope then brought to the field used throughout the remainder of the discectomy.  Remaining aspects of annulus and osteophytes removed using high-speed drill down to the level of the posterior longitudinal ligament.  Posterior logical is not elevated and resected.  Wide central decompression then performed undercutting the bodies of C5 and C6.  Decompression then proceeded each neural foramina.  Wide anterior  foraminotomies performed on the course exiting C6 nerve roots bilaterally.  At this point a very thorough decompression had been achieved.  There was no evidence of injury to the thecal sac and nerve roots.  Procedure was then repeated at C6-7 again without complication.  Wound was then irrigated.  Gelfoam was placed topically and then removed.  [employer] anatomic peek cages were then packed with locally harvested autograft.  Each cage was then impacted in the place and recessed slightly from the anterior cortical margin.  [employer] anterior Atlantis cervical plate was then placed over the C5, C6 and C7 levels.  This then attached under fluoroscopic guidance using 13 mm variable angle screws to each at all 3 levels.  All screws given final tightening found to be solidly within the bone.  Locking screws engaged all levels.  Final images reveal good position of the cages and the hardware at the proper operative level with normal alignment of spine.  Wound was irrigated.  Hemostasis was assured.  Wounds then closed in layers of Vicryl sutures.  Steri-Strips and sterile dressing were applied.  No apparent complications.  Patient tolerated the procedure well and returns recovery room postop.

## 2021-09-03 NOTE — H&P (Signed)
Jacob Collier is an 66 y.o. male.   Chief Complaint: Neck pain HPI: 66 year old male with neck pain with radiating pain numbness and weakness of both upper extremities.  Symptoms not related to any prior accident or injury.  Patient notes electrical feeling shooting into both arms and legs with certain neck positions.  Work-up demonstrates evidence of severe stenosis at C5-6 and C6-7 with spinal cord signal abnormality at C5-6.  Patient presents now for two-level anterior cervical decompression and fusion in hopes of improving his symptoms.  Past Medical History:  Diagnosis Date   Anxiety    Arthritis    Coronary artery disease    s/p prior stenting to RCA. Last cath in 05/2019 showed in-stent CTO of RCA and CTO of LCX (medical therpay recommended)   Diverticulosis    Dyspnea    Hard of hearing    Hyperlipidemia    Hypertension    hx of elevation on lyrica, off med and now normal    Paroxysmal SVT (supraventricular tachycardia) ([place])    Prostate cancer ([place])    Wears glasses     Past Surgical History:  Procedure Laterality Date   CARDIAC CATHETERIZATION     with 2 stents    COLECTOMY  05/2018   COLONOSCOPY     CORONARY ARTERY BYPASS GRAFT N/A 01/19/2021   Procedure: CORONARY ARTERY BYPASS GRAFTING (CABG), ON PUMP TIMES FOUR, USING LEFT INTERNAL MAMMARY ARTERY AND ENDOSOCPICALLY HARVESTED RIGHT GREATER SAPHENOUS VEIN;  Surgeon: Lajuana Matte, MD;  Location: [facility];  Service: Open Heart Surgery;  Laterality: N/A;   CYSTOSCOPY WITH URETHRAL DILATATION N/A 01/14/2021   Procedure: CYSTOSCOPY WITH BALLOON  DILATATION;  Surgeon: Bjorn Loser, MD;  Location: [facility];  Service: Urology;  Laterality: N/A;   EYE SURGERY  02/2020   bilaterl cataracts   IR THORACENTESIS ASP PLEURAL SPACE W/IMG GUIDE  02/02/2021   LEFT HEART CATH AND CORONARY ANGIOGRAPHY N/A 12/24/2020   Procedure: LEFT HEART CATH AND CORONARY ANGIOGRAPHY;  Surgeon: Martinique, Peter M, MD;  Location: [facility];   Service: Cardiovascular;  Laterality: N/A;   PROSTATECTOMY     TEE WITHOUT CARDIOVERSION N/A 01/19/2021   Procedure: TRANSESOPHAGEAL ECHOCARDIOGRAM (TEE);  Surgeon: Lajuana Matte, MD;  Location: [facility];  Service: Open Heart Surgery;  Laterality: N/A;   TOTAL HIP ARTHROPLASTY Left 11/10/2015   Procedure: LEFT TOTAL HIP ARTHROPLASTY ANTERIOR APPROACH;  Surgeon: Mcarthur Rossetti, MD;  Location: [facility];  Service: Orthopedics;  Laterality: Left;    Family History  Problem Relation Age of Onset   Alzheimer's disease Mother    Stroke Father    Diabetes Sister    Heart attack Maternal Grandmother    Cancer Maternal Uncle        prostate   Prostate cancer Maternal Uncle    Cancer Maternal Uncle        prostate   Prostate cancer Maternal Uncle    Colon polyps Brother    Colon cancer Neg Hx    Esophageal cancer Neg Hx    Rectal cancer Neg Hx    Stomach cancer Neg Hx    Social History:  reports that he has never smoked. He has never used smokeless tobacco. He reports that he does not currently use alcohol. He reports that he does not use drugs.  Allergies:  Allergies  Allergen Reactions   Ranolazine     Chest discomfort/Reflux   Statins Other (See Comments)    myalgias    Medications  Prior to Admission  Medication Sig Dispense Refill   acetaminophen (TYLENOL) 500 MG tablet Take 1,000 mg by mouth every 8 (eight) hours as needed for mild pain.     amLODipine (NORVASC) 10 MG tablet Take 1 tablet (10 mg total) by mouth daily. 30 tablet 6   aspirin EC 81 MG tablet Take 81 mg by mouth daily. Swallow whole.     clonazePAM (KLONOPIN) 1 MG tablet TAKE 1 TABLET BY MOUTH 2 TIMES DAILY AS NEEDED FOR ANXIETY. 60 tablet 3   clopidogrel (PLAVIX) 75 MG tablet Take 1 tablet (75 mg total) by mouth daily. Stop taking plavix on Saturday Nov. 5, 2022 (Patient taking differently: Take 75 mg by mouth at bedtime.) 90 tablet 1   Eszopiclone 3 MG TABS TAKE 1 TABLET BY MOUTH EVERYDAY AT BEDTIME 30  tablet 2   Evolocumab (REPATHA SURECLICK) 295 MG/ML SOAJ Inject 140 mg into the skin every 14 (fourteen) days. 2 mL 11   ezetimibe (ZETIA) 10 MG tablet TAKE 1 TABLET BY MOUTH EVERY DAY 90 tablet 3   metoprolol succinate (TOPROL-XL) 25 MG 24 hr tablet Take 0.5 tablets (12.5 mg total) by mouth daily. Take with or immediately following a meal. (Patient taking differently: Take 12.5 mg by mouth 2 (two) times daily. Take with or immediately following a meal.) 45 tablet 2   Omega-3 Fatty Acids (OMEGA 3 PO) Take 1 capsule by mouth 2 (two) times daily.     SALONPAS PAIN RELIEVING 4 % Place 1 patch onto the skin daily as needed for pain.     nitroGLYCERIN (NITROSTAT) 0.4 MG SL tablet Place 1 tablet (0.4 mg total) under the tongue every 5 (five) minutes as needed for chest pain. (Patient not taking: Reported on 08/24/2021) 25 tablet 2    No results found for this or any previous visit (from the past 48 hour(s)). No results found.  Pertinent items noted in HPI and remainder of comprehensive ROS otherwise negative.  Blood pressure 130/90, pulse (!) 57, temperature 98.2 F (36.8 C), temperature source Oral, resp. rate 17, height '5\' 11"'$  (1.803 m), weight 101.1 kg, SpO2 95 %.  Patient is awake and alert.  He is oriented and appropriate.  Speech is fluent.  Judgment insight are intact.  Cranial nerve function normal bilateral.  Motor examination reveals weakness in both hands with his grips 4/5 in his intrinsics 4/5.  Sensory examination with decreased sensation distally in both upper and lower extremities.  Reflexes are increased in both lower extremities" focal in his upper extremities.  He has Hoffmann's responses in both hands.  His gait is unsteady and somewhat spastic.  Examination head ears eyes nose and throat is unremarked.  Chest and abdomen are benign.  Extremities are free from injury or deformity. Assessment/Plan C5-6, C6-7 stenosis with myelopathy.  Plan C5-6, C6-7 anterior cervical discectomy with  interbody fusion utilizing interbody cages, local harvested autograft, and anterior plate is rotation.  Risks and benefits been explained.  Patient wishes to proceed.  Jacob Collier A Jacob Collier 09/03/2021, 9:59 AM

## 2021-09-03 NOTE — Anesthesia Postprocedure Evaluation (Signed)
Anesthesia Post Note  Patient: Jacob Collier  Procedure(s) Performed: CERVICAL FIVE-SIX, CERVICAL SIX-SEVEN ANTERIOR CERVICAL DECOMPRESSION/DISCECTOMY FUSION (Spine Cervical)     Patient location during evaluation: PACU Anesthesia Type: General Level of consciousness: sedated and patient cooperative Pain management: pain level controlled Vital Signs Assessment: post-procedure vital signs reviewed and stable Respiratory status: spontaneous breathing Cardiovascular status: stable Anesthetic complications: no   No notable events documented.  Last Vitals:  Vitals:   09/03/21 1428 09/03/21 1546  BP: 126/83 130/77  Pulse: (!) 54 70  Resp: 18 17  Temp:  36.4 C  SpO2: 96% 95%    Last Pain:  Vitals:   09/03/21 1621  TempSrc:   PainSc: [place]

## 2021-09-03 NOTE — Anesthesia Procedure Notes (Signed)
Procedure Name: Intubation Date/Time: 09/03/2021 10:55 AM  Performed by: Lorie Phenix, CRNAPre-anesthesia Checklist: Patient identified, Emergency Drugs available, Suction available and Patient being monitored Patient Re-evaluated:Patient Re-evaluated prior to induction Oxygen Delivery Method: Circle system utilized Preoxygenation: Pre-oxygenation with 100% oxygen Induction Type: IV induction Ventilation: Mask ventilation without difficulty Laryngoscope Size: Glidescope Grade View: Grade I Tube type: Oral Tube size: 7.5 mm Number of attempts: 1 Airway Equipment and Method: Rigid stylet Placement Confirmation: ETT inserted through vocal cords under direct vision, positive ETCO2 and breath sounds checked- equal and bilateral Secured at: 24 cm Tube secured with: Tape Dental Injury: Teeth and Oropharynx as per pre-operative assessment

## 2021-09-03 NOTE — Discharge Instructions (Addendum)
*  Restart Plavix on 09/06/2021*  Wound Care Keep incision covered and dry for two days.    Do not put any creams, lotions, or ointments on incision. Leave steri-strips on back.  They will fall off by themselves. You are fine to shower. Let water run over incision and pat dry.  Activity Walk each and every day, increasing distance each day. No lifting greater than 5 lbs.  Avoid excessive neck motion. No driving for 2 weeks; may ride as a passenger locally.  Diet Resume your normal diet.   Return to Work Will be discussed at your follow up appointment.  Call Your Doctor If Any of These Occur Redness, drainage, or swelling at the wound.  Temperature greater than 101 degrees. Severe pain not relieved by pain medication. Incision starts to come apart.  Follow Up Appt Call [phone] today for appointment in 2-3 weeks if you don't already have one or for any problems.

## 2021-09-03 NOTE — Progress Notes (Signed)
Patient alert and oriented, mae's well, voiding adequate amount of urine, swallowing without difficulty, no c/o pain at time of discharge. Patient discharged home with family. Script and discharged instructions given to patient. Patient and family stated understanding of instructions given. Patient has an appointment with Dr. Pool  

## 2021-09-03 NOTE — Progress Notes (Signed)
Orthopedic Tech Progress Note Patient Details:  Jacob Collier [birth year] [id number]  Ortho Devices Type of Ortho Device: Soft collar Ortho Device/Splint Interventions: Ordered, Application   Post Interventions Patient Tolerated: Well  Leilany Digeronimo A Aamira Bischoff 09/03/2021, 2:03 PM

## 2021-09-03 NOTE — Discharge Summary (Signed)
Physician Discharge Summary     Providing Compassionate, Quality Care - Together   Patient ID: Jacob Collier MRN: [id number] DOB/AGE: 68-22-1957 68 y.o.  Admit date: 09/03/2021 Discharge date: 09/03/2021  Admission Diagnoses: Cervical spondylosis with myelopathy and radiculopathy  Discharge Diagnoses:  Principal Problem:   Cervical spondylosis with myelopathy and radiculopathy   Discharged Condition: good  Hospital Course: Patient underwent a C5-6, C6-7 ACDF by Dr. Annette Stable on 09/03/2021. He was admitted to 3C04 following recovery from anesthesia in the PACU. His postoperative course has been uncomplicated. He has worked with both physical and occupational therapies who feel the patient is ready for discharge home. He is ambulating independently and without difficulty. He is tolerating a normal diet. He is not having any bowel or bladder dysfunction. His pain is well-controlled with oral pain medication. He is ready for discharge home.   Consults: PT/OT  Significant Diagnostic Studies: radiology: DG Cervical Spine 1 View  Result Date: 09/03/2021 CLINICAL DATA:  Cervical spine fusion. EXAM: DG CERVICAL SPINE - 1 VIEW COMPARISON:  Cervical spine radiographs 04/05/2021 FLUOROSCOPY: Fluoroscopy Time: 7 seconds Radiation Exposure Index: 3.02 mGy FINDINGS: 2 intraoperative spot fluoroscopic images of the cervical spine are provided. ACDF is evident at C5-6, however the spinous poorly visualized below this level. By report, ACDF was also performed at C6-7. IMPRESSION: Intraoperative images during cervical fusion. Electronically Signed   By: Logan Bores M.D.   On: 09/03/2021 12:56   DG C-Arm 1-60 Min-No Report  Result Date: 09/03/2021 Fluoroscopy was utilized by the requesting physician.  No radiographic interpretation.   DG C-Arm 1-60 Min-No Report  Result Date: 09/03/2021 Fluoroscopy was utilized by the requesting physician.  No radiographic interpretation.     Treatments: surgery:  C5-6, C6-7 anterior cervical discectomy with interbody fusion utilizing interbody cages (Medtronic anatomic peek cages), local harvested autograft, and anterior plate instrumentation (Medtronic anterior Atlantis cervical plate, 13 mm variable angle screws)  Discharge Exam: Blood pressure 130/77, pulse 70, temperature 97.6 F (36.4 C), temperature source Oral, resp. rate 17, height '5\' 11"'$  (1.803 m), weight 101.1 kg, SpO2 95 %.  Alert and oriented x 4 PERRLA CN II-XII grossly intact MAE, Strength and sensation intact Incision is covered with gauze dressing and Steri Strips; Dressing is clean, dry, and intact   Disposition: Discharge disposition: 01-Home or Self Care       Discharge Instructions     Call MD for:  persistant nausea and vomiting   Complete by: As directed    Call MD for:  redness, tenderness, or signs of infection (pain, swelling, redness, odor or green/yellow discharge around incision site)   Complete by: As directed    Call MD for:  severe uncontrolled pain   Complete by: As directed    Call MD for:  temperature >100.4   Complete by: As directed    Diet - low sodium heart healthy   Complete by: As directed    Increase activity slowly   Complete by: As directed    No wound care   Complete by: As directed    Remove dressing in 48 hours   Complete by: As directed       Allergies as of 09/03/2021       Reactions   Ranolazine    Chest discomfort/Reflux   Statins Other (See Comments)   myalgias        Medication List     TAKE these medications    acetaminophen 500 MG tablet Commonly known as: TYLENOL  Take 1,000 mg by mouth every 8 (eight) hours as needed for mild pain.   amLODipine 10 MG tablet Commonly known as: NORVASC Take 1 tablet (10 mg total) by mouth daily.   aspirin EC 81 MG tablet Take 81 mg by mouth daily. Swallow whole.   clonazePAM 1 MG tablet Commonly known as: KLONOPIN TAKE 1 TABLET BY MOUTH 2 TIMES DAILY AS NEEDED FOR  ANXIETY.   clopidogrel 75 MG tablet Commonly known as: PLAVIX Take 1 tablet (75 mg total) by mouth at bedtime. *Restart Plavix on 08/07/2021* What changed:  when to take this additional instructions   cyclobenzaprine 10 MG tablet Commonly known as: FLEXERIL Take 1 tablet (10 mg total) by mouth 3 (three) times daily as needed for muscle spasms.   Eszopiclone 3 MG Tabs TAKE 1 TABLET BY MOUTH EVERYDAY AT BEDTIME   ezetimibe 10 MG tablet Commonly known as: ZETIA TAKE 1 TABLET BY MOUTH EVERY DAY   HYDROcodone-acetaminophen 10-325 MG tablet Commonly known as: NORCO Take 1 tablet by mouth every 4 (four) hours as needed for severe pain ((score 7 to 10)).   metoprolol succinate 25 MG 24 hr tablet Commonly known as: TOPROL-XL Take 0.5 tablets (12.5 mg total) by mouth daily. Take with or immediately following a meal. What changed: when to take this   nitroGLYCERIN 0.4 MG SL tablet Commonly known as: NITROSTAT Place 1 tablet (0.4 mg total) under the tongue every 5 (five) minutes as needed for chest pain.   OMEGA 3 PO Take 1 capsule by mouth 2 (two) times daily.   Repatha SureClick 269 MG/ML Soaj Generic drug: Evolocumab Inject 140 mg into the skin every 14 (fourteen) days.   Salonpas Pain Relieving 4 % Generic drug: lidocaine Place 1 patch onto the skin daily as needed for pain.        Follow-up Information     Earnie Larsson, MD. Go on 09/14/2021.   Specialty: Neurosurgery Why: First post op appointment is on 09/14/2021 at 4:45 pm. Contact information: [address] [place] [address] [phone]                 Signed: Viona Gilmore, DNP, AGNP-C Nurse Practitioner  [facility] [place] [address], [place], [place] [address] P: [phone]    F: [phone]  09/03/2021, 4:52 PM

## 2021-09-03 NOTE — Evaluation (Signed)
Occupational Therapy Evaluation Patient Details Name: Jacob Collier MRN: [id number] DOB: 04-03-1955 Today's Date: 09/03/2021   History of Present Illness 66 yo M s/p ACDF.  PMH includes: anxiety, THR, Prostate CA.   Clinical Impression   Patient admitted for the procedure above.  PTA the patient remains very active, continues to work and needed no assist with any aspect of mobility, ADL or iADL.   Patient is essentially at his baseline, and no acute or post acute rehab is necessary.  Patient is experiencing post op soreness, but it does not impact his functional status.  Precautions reviewed, all questions answered, and patient will have any assist needed at home.       Recommendations for follow up therapy are one component of a multi-disciplinary discharge planning process, led by the attending physician.  Recommendations may be updated based on patient status, additional functional criteria and insurance authorization.   Follow Up Recommendations  No OT follow up    Assistance Recommended at Discharge PRN  Patient can return home with the following      Functional Status Assessment  Patient has not had a recent decline in their functional status  Equipment Recommendations  None recommended by OT    Recommendations for Other Services       Precautions / Restrictions Precautions Precautions: Cervical Precaution Booklet Issued: Yes (comment) Precaution Comments: verbalized understanding Required Braces or Orthoses: Cervical Brace Cervical Brace: Soft collar;For comfort Restrictions Weight Bearing Restrictions: No      Mobility Bed Mobility Overal bed mobility: Modified Independent                  Transfers Overall transfer level: Independent                        Balance Overall balance assessment: No apparent balance deficits (not formally assessed)                                         ADL either performed or assessed with  clinical judgement   ADL Overall ADL's : At baseline                                             Vision Patient Visual Report: No change from baseline       Perception Perception Perception: Not tested   Praxis Praxis Praxis: Not tested    Pertinent Vitals/Pain Pain Assessment Pain Assessment: Faces Faces Pain Scale: Hurts a little bit Pain Location: neck Pain Descriptors / Indicators: Aching Pain Intervention(s): Monitored during session     Hand Dominance Right   Extremity/Trunk Assessment Upper Extremity Assessment Upper Extremity Assessment: Overall WFL for tasks assessed   Lower Extremity Assessment Lower Extremity Assessment: Overall WFL for tasks assessed   Cervical / Trunk Assessment Cervical / Trunk Assessment: Neck Surgery   Communication Communication Communication: HOH   Cognition Arousal/Alertness: Awake/alert Behavior During Therapy: WFL for tasks assessed/performed Overall Cognitive Status: Within Functional Limits for tasks assessed                                       General Comments   VSS on RA  Exercises     Shoulder Instructions      Home Living Family/patient expects to be discharged to:: Private residence Living Arrangements: Spouse/significant other Available Help at Discharge: Family Type of Home: House Home Access: Stairs to enter Technical brewer of Steps: 4 Entrance Stairs-Rails: Right;Left Home Layout: One level     Bathroom Shower/Tub: Occupational psychologist: Standard Bathroom Accessibility: Yes How Accessible: Accessible via walker Home Equipment: None          Prior Functioning/Environment Prior Level of Function : Independent/Modified Independent;Working/employed;Driving                        OT Problem List: Pain      OT Treatment/Interventions:      OT Goals(Current goals can be found in the care plan section) Acute Rehab OT  Goals Patient Stated Goal: hoping to return home OT Goal Formulation: With patient Time For Goal Achievement: 09/06/21 Potential to Achieve Goals: Good  OT Frequency:      Co-evaluation              AM-PAC OT "6 Clicks" Daily Activity     Outcome Measure Help from another person eating meals?: None Help from another person taking care of personal grooming?: None Help from another person toileting, which includes using toliet, bedpan, or urinal?: None Help from another person bathing (including washing, rinsing, drying)?: None Help from another person to put on and taking off regular upper body clothing?: None Help from another person to put on and taking off regular lower body clothing?: None 6 Click Score: 24   End of Session Equipment Utilized During Treatment: Cervical collar Nurse Communication: Mobility status  Activity Tolerance: Patient tolerated treatment well Patient left: in bed;with call bell/phone within reach  OT Visit Diagnosis: Pain                Time: 4627-0350 OT Time Calculation (min): 19 min Charges:  OT General Charges $OT Visit: 1 Visit OT Evaluation $OT Eval Moderate Complexity: 1 Mod  09/03/2021  RP, OTR/L  Acute Rehabilitation Services  Office:  [phone]   Metta Clines 09/03/2021, 4:53 PM

## 2021-09-03 NOTE — Brief Op Note (Signed)
09/03/2021  12:47 PM  PATIENT:  Jacob Collier  66 y.o. male  PRE-OPERATIVE DIAGNOSIS:  Stenosis  POST-OPERATIVE DIAGNOSIS:  Stenosis  PROCEDURE:  Procedure(s): CERVICAL FIVE-SIX, CERVICAL SIX-SEVEN ANTERIOR CERVICAL DECOMPRESSION/DISCECTOMY FUSION (N/A)  SURGEON:  Surgeon(s) and Role:    * Earnie Larsson, MD - Primary  PHYSICIAN ASSISTANT:   ASSISTANTS:Bergman,NP    ANESTHESIA:   general  EBL:  minimal   BLOOD ADMINISTERED:none  DRAINS: none   LOCAL MEDICATIONS USED:  NONE  SPECIMEN:  No Specimen  DISPOSITION OF SPECIMEN:  N/A  COUNTS:  YES  TOURNIQUET:  * No tourniquets in log *  DICTATION: .Dragon Dictation  PLAN OF CARE: Admit for overnight observation  PATIENT DISPOSITION:  PACU - hemodynamically stable.   Delay start of Pharmacological VTE agent (>24hrs) due to surgical blood loss or risk of bleeding: yes

## 2021-09-03 NOTE — Plan of Care (Signed)

## 2021-09-03 NOTE — Transfer of Care (Signed)
Immediate Anesthesia Transfer of Care Note  Patient: Jacob Collier  Procedure(s) Performed: CERVICAL FIVE-SIX, CERVICAL SIX-SEVEN ANTERIOR CERVICAL DECOMPRESSION/DISCECTOMY FUSION (Spine Cervical)  Patient Location: PACU  Anesthesia Type:General  Level of Consciousness: drowsy  Airway & Oxygen Therapy: Patient Spontanous Breathing and Patient connected to face mask oxygen  Post-op Assessment: Report given to RN and Post -op Vital signs reviewed and stable  Post vital signs: Reviewed and stable  Last Vitals:  Vitals Value Taken Time  BP 174/80 09/03/21 1300  Temp    Pulse 63 09/03/21 1305  Resp 22 09/03/21 1305  SpO2 92 % 09/03/21 1305  Vitals shown include unvalidated device data.  Last Pain:  Vitals:   09/03/21 0913  TempSrc:   PainSc: 10-Worst pain ever      Patients Stated Pain Goal: 2 (14/38/88 7579)  Complications: No notable events documented.

## 2021-09-04 ENCOUNTER — Other Ambulatory Visit: Payer: Self-pay | Admitting: Legal Medicine

## 2021-09-04 DIAGNOSIS — M545 Low back pain, unspecified: Secondary | ICD-10-CM

## 2021-09-06 ENCOUNTER — Encounter (HOSPITAL_COMMUNITY): Payer: Self-pay | Admitting: Neurosurgery

## 2021-09-06 ENCOUNTER — Other Ambulatory Visit: Payer: Self-pay | Admitting: Cardiovascular Disease

## 2021-09-12 ENCOUNTER — Other Ambulatory Visit: Payer: Self-pay | Admitting: Hematology and Oncology

## 2021-09-12 DIAGNOSIS — D751 Secondary polycythemia: Secondary | ICD-10-CM

## 2021-09-13 ENCOUNTER — Inpatient Hospital Stay: Payer: Managed Care, Other (non HMO) | Admitting: Hematology and Oncology

## 2021-09-13 ENCOUNTER — Inpatient Hospital Stay: Payer: Managed Care, Other (non HMO) | Attending: Hematology and Oncology

## 2021-09-28 ENCOUNTER — Ambulatory Visit (INDEPENDENT_AMBULATORY_CARE_PROVIDER_SITE_OTHER): Payer: Managed Care, Other (non HMO) | Admitting: Legal Medicine

## 2021-09-28 ENCOUNTER — Encounter: Payer: Self-pay | Admitting: Legal Medicine

## 2021-09-28 VITALS — BP 130/78 | HR 58 | Temp 97.5°F | Ht 71.0 in | Wt 213.6 lb

## 2021-09-28 DIAGNOSIS — C61 Malignant neoplasm of prostate: Secondary | ICD-10-CM | POA: Diagnosis not present

## 2021-09-28 DIAGNOSIS — K5903 Drug induced constipation: Secondary | ICD-10-CM

## 2021-09-28 DIAGNOSIS — T402X5A Adverse effect of other opioids, initial encounter: Secondary | ICD-10-CM

## 2021-09-28 DIAGNOSIS — I208 Other forms of angina pectoris: Secondary | ICD-10-CM

## 2021-09-28 DIAGNOSIS — I2089 Other forms of angina pectoris: Secondary | ICD-10-CM

## 2021-09-28 MED ORDER — NALOXEGOL OXALATE 12.5 MG PO TABS: 12.5000 mg | ORAL_TABLET | Freq: Every day | ORAL | 2 refills | Status: DC

## 2021-09-28 NOTE — Progress Notes (Signed)
Acute Office Visit  Subjective:    Patient ID: Jacob Collier, male    DOB: [birth year], 66 y.o.   MRN: [id number]  Chief Complaint  Patient presents with   Joint Pain   Constipation    HPI: Patient is in today for Join pain on both hips, shoulders. He has been in pain since 3-4 days ago. He also mentioned to have constipation since 3-4  days ago. He has tried miralax and it did not help him.  He would like something more stronger. Patient is having constipation, on pain medicines, he is post cervical spine surgery.  Joints aching.  Past Medical History:  Diagnosis Date   Anxiety    Arthritis    Coronary artery disease    s/p prior stenting to RCA. Last cath in 05/2019 showed in-stent CTO of RCA and CTO of LCX (medical therpay recommended)   Diverticulosis    Dyspnea    Hard of hearing    Hyperlipidemia    Hypertension    hx of elevation on lyrica, off med and now normal    Paroxysmal SVT (supraventricular tachycardia) ([place])    Prostate cancer ([place])    Wears glasses     Past Surgical History:  Procedure Laterality Date   ANTERIOR CERVICAL DECOMP/DISCECTOMY FUSION N/A 09/03/2021   Procedure: CERVICAL FIVE-SIX, CERVICAL SIX-SEVEN ANTERIOR CERVICAL DECOMPRESSION/DISCECTOMY FUSION;  Surgeon: Earnie Larsson, MD;  Location: [facility];  Service: Neurosurgery;  Laterality: N/A;   CARDIAC CATHETERIZATION     with 2 stents    COLECTOMY  05/2018   COLONOSCOPY     CORONARY ARTERY BYPASS GRAFT N/A 01/19/2021   Procedure: CORONARY ARTERY BYPASS GRAFTING (CABG), ON PUMP TIMES FOUR, USING LEFT INTERNAL MAMMARY ARTERY AND ENDOSOCPICALLY HARVESTED RIGHT GREATER SAPHENOUS VEIN;  Surgeon: Lajuana Matte, MD;  Location: [facility];  Service: Open Heart Surgery;  Laterality: N/A;   CYSTOSCOPY WITH URETHRAL DILATATION N/A 01/14/2021   Procedure: CYSTOSCOPY WITH BALLOON  DILATATION;  Surgeon: Bjorn Loser, MD;  Location: [facility];  Service: Urology;  Laterality: N/A;   EYE SURGERY  02/2020    bilaterl cataracts   IR THORACENTESIS ASP PLEURAL SPACE W/IMG GUIDE  02/02/2021   LEFT HEART CATH AND CORONARY ANGIOGRAPHY N/A 12/24/2020   Procedure: LEFT HEART CATH AND CORONARY ANGIOGRAPHY;  Surgeon: Martinique, Peter M, MD;  Location: [facility];  Service: Cardiovascular;  Laterality: N/A;   PROSTATECTOMY     TEE WITHOUT CARDIOVERSION N/A 01/19/2021   Procedure: TRANSESOPHAGEAL ECHOCARDIOGRAM (TEE);  Surgeon: Lajuana Matte, MD;  Location: [facility];  Service: Open Heart Surgery;  Laterality: N/A;   TOTAL HIP ARTHROPLASTY Left 11/10/2015   Procedure: LEFT TOTAL HIP ARTHROPLASTY ANTERIOR APPROACH;  Surgeon: Mcarthur Rossetti, MD;  Location: [facility];  Service: Orthopedics;  Laterality: Left;    Family History  Problem Relation Age of Onset   Alzheimer's disease Mother    Stroke Father    Diabetes Sister    Heart attack Maternal Grandmother    Cancer Maternal Uncle        prostate   Prostate cancer Maternal Uncle    Cancer Maternal Uncle        prostate   Prostate cancer Maternal Uncle    Colon polyps Brother    Colon cancer Neg Hx    Esophageal cancer Neg Hx    Rectal cancer Neg Hx    Stomach cancer Neg Hx     Social History   Socioeconomic History   Marital  status: Married    Spouse name: Not on file   Number of children: 0   Years of education: Not on file   Highest education level: Not on file  Occupational History   Occupation: Employed at [employer]  Tobacco Use   Smoking status: Never   Smokeless tobacco: Never  Vaping Use   Vaping Use: Never used  Substance and Sexual Activity   Alcohol use: Not Currently    Alcohol/week: 0.0 standard drinks of alcohol    Comment: rarely   Drug use: No   Sexual activity: Yes    Partners: Female    Birth control/protection: None  Other Topics Concern   Not on file  Social History Narrative   Not on file   Social Determinants of Health   Financial Resource Strain: Not on file  Food Insecurity: Not on file   Transportation Needs: Not on file  Physical Activity: Not on file  Stress: Not on file  Social Connections: Not on file  Intimate Partner Violence: Not on file    Outpatient Medications Prior to Visit  Medication Sig Dispense Refill   acetaminophen (TYLENOL) 500 MG tablet Take 1,000 mg by mouth every 8 (eight) hours as needed for mild pain.     amLODipine (NORVASC) 10 MG tablet Take 1 tablet (10 mg total) by mouth daily. 30 tablet 6   aspirin EC 81 MG tablet Take 81 mg by mouth daily. Swallow whole.     clonazePAM (KLONOPIN) 1 MG tablet TAKE 1 TABLET BY MOUTH 2 TIMES DAILY AS NEEDED FOR ANXIETY. 60 tablet 3   clopidogrel (PLAVIX) 75 MG tablet Take 1 tablet (75 mg total) by mouth at bedtime. *Restart Plavix on 09/06/2021*     cyclobenzaprine (FLEXERIL) 10 MG tablet Take 1 tablet (10 mg total) by mouth 3 (three) times daily as needed for muscle spasms. 30 tablet 0   Eszopiclone 3 MG TABS TAKE 1 TABLET BY MOUTH EVERYDAY AT BEDTIME 30 tablet 2   Evolocumab (REPATHA SURECLICK) 409 MG/ML SOAJ Inject 140 mg into the skin every 14 (fourteen) days. 2 mL 11   ezetimibe (ZETIA) 10 MG tablet TAKE 1 TABLET BY MOUTH EVERY DAY 90 tablet 3   metoprolol succinate (TOPROL-XL) 25 MG 24 hr tablet Take 0.5 tablets (12.5 mg total) by mouth daily. Take with or immediately following a meal. (Patient taking differently: Take 12.5 mg by mouth 2 (two) times daily. Take with or immediately following a meal.) 45 tablet 2   Omega-3 Fatty Acids (OMEGA 3 PO) Take 1 capsule by mouth 2 (two) times daily.     oxyCODONE-acetaminophen (PERCOCET/ROXICET) 5-325 MG tablet take 1-2 tablet(s) by oral route  every 6 hours as needed for pain     HYDROcodone-acetaminophen (NORCO) 10-325 MG tablet Take 1 tablet by mouth every 4 (four) hours as needed for severe pain ((score 7 to 10)). (Patient not taking: Reported on 09/28/2021) 30 tablet 0   nitroGLYCERIN (NITROSTAT) 0.4 MG SL tablet Place 1 tablet (0.4 mg total) under the tongue every 5  (five) minutes as needed for chest pain. (Patient not taking: Reported on 08/24/2021) 25 tablet 2   SALONPAS PAIN RELIEVING 4 % Place 1 patch onto the skin daily as needed for pain. (Patient not taking: Reported on 09/28/2021)     No facility-administered medications prior to visit.    Allergies  Allergen Reactions   Ranolazine     Chest discomfort/Reflux   Atorvastatin     myalgias Other reaction(s): Myalgias (intolerance)  myalgias   Pravastatin     myalgias Other reaction(s): Myalgias (intolerance) myalgias   Rosuvastatin     myalgias Other reaction(s): Myalgias (intolerance) myalgias   Statins Other (See Comments)    myalgias    Review of Systems  Constitutional:  Negative for chills, fatigue, fever and unexpected weight change.  HENT:  Negative for congestion, ear pain, sinus pain and sore throat.   Eyes:  Negative for visual disturbance.  Respiratory:  Negative for cough and shortness of breath.   Cardiovascular:  Negative for chest pain and palpitations.  Gastrointestinal:  Positive for constipation. Negative for abdominal pain, blood in stool, diarrhea, nausea and vomiting.  Endocrine: Negative for polydipsia.  Genitourinary:  Negative for dysuria.  Musculoskeletal:  Positive for arthralgias. Negative for back pain.  Skin:  Negative for rash.  Neurological:  Negative for headaches.  Psychiatric/Behavioral: Negative.         Objective:    Physical Exam Vitals reviewed.  Constitutional:      General: He is not in acute distress.    Appearance: Normal appearance.  HENT:     Head: Normocephalic.     Right Ear: Tympanic membrane normal.     Left Ear: Tympanic membrane normal.     Mouth/Throat:     Mouth: Mucous membranes are moist.     Pharynx: Oropharynx is clear.  Eyes:     Extraocular Movements: Extraocular movements intact.     Conjunctiva/sclera: Conjunctivae normal.     Pupils: Pupils are equal, round, and reactive to light.  Cardiovascular:     Rate  and Rhythm: Normal rate and regular rhythm.     Pulses: Normal pulses.     Heart sounds: Normal heart sounds. No murmur heard.    No gallop.  Pulmonary:     Effort: Pulmonary effort is normal. No respiratory distress.     Breath sounds: No wheezing.  Abdominal:     General: Abdomen is flat. Bowel sounds are normal. There is no distension.     Palpations: Abdomen is soft.     Tenderness: There is no abdominal tenderness.  Musculoskeletal:        General: Normal range of motion.     Cervical back: Normal range of motion and neck supple.  Skin:    General: Skin is warm.     Capillary Refill: Capillary refill takes less than 2 seconds.  Neurological:     General: No focal deficit present.     Mental Status: He is alert and oriented to person, place, and time. Mental status is at baseline.     BP 130/78   Pulse (!) 58   Temp (!) 97.5 F (36.4 C)   Ht 5' 11"  (1.803 m)   Wt 213 lb 9.6 oz (96.9 kg)   SpO2 97%   BMI 29.79 kg/m  Wt Readings from Last 3 Encounters:  09/28/21 213 lb 9.6 oz (96.9 kg)  09/03/21 222 lb 14.4 oz (101.1 kg)  08/26/21 222 lb 14.4 oz (101.1 kg)    Health Maintenance Due  Topic Date Due   TETANUS/TDAP  Never done   Zoster Vaccines- Shingrix (1 of 2) Never done   Pneumonia Vaccine 42+ Years old (1 - PCV) Never done       Lab Results  Component Value Date   TSH 2.450 07/05/2021   Lab Results  Component Value Date   WBC 7.7 08/26/2021   HGB 18.4 (H) 08/26/2021   HCT 54.9 (H) 08/26/2021   MCV  89.4 08/26/2021   PLT 185 08/26/2021   Lab Results  Component Value Date   NA 136 08/26/2021   K 4.5 08/26/2021   CO2 24 08/26/2021   GLUCOSE 92 08/26/2021   BUN 16 08/26/2021   CREATININE 1.21 08/26/2021   BILITOT 0.4 07/05/2021   ALKPHOS 81 07/05/2021   AST 15 07/05/2021   ALT 21 07/05/2021   PROT 6.3 07/05/2021   ALBUMIN 4.3 07/05/2021   CALCIUM 9.3 08/26/2021   ANIONGAP 8 08/26/2021   EGFR 71 07/05/2021   Lab Results  Component Value  Date   CHOL 75 (L) 07/05/2021   Lab Results  Component Value Date   HDL 36 (L) 07/05/2021   Lab Results  Component Value Date   LDLCALC 20 07/05/2021   Lab Results  Component Value Date   TRIG 94 07/05/2021   Lab Results  Component Value Date   CHOLHDL 2.1 07/05/2021   Lab Results  Component Value Date   HGBA1C 5.8 (H) 03/04/2021       Assessment & Plan:   Problem List Items Addressed This Visit       Cardiovascular and Mediastinum   Other forms of angina pectoris [facility]) An individual plan was formulated based on patient history and exam, labs and evidence based data. Patient has not had recent angina or nitroglycerin use. continue present treatment.      Genitourinary   Prostate cancer ([place]) Cancer is stable   Other Visit Diagnoses     Constipation due to opioid therapy    -  Primary   Relevant Medications   naloxegol oxalate (MOVANTIK) 12.5 MG TABS tablet Patient has chronic constipation from his post operative opioid therapy      Meds ordered this encounter  Medications   naloxegol oxalate (MOVANTIK) 12.5 MG TABS tablet    Sig: Take 1 tablet (12.5 mg total) by mouth daily.    Dispense:  30 tablet    Refill:  2       Follow-up: Return if symptoms worsen or fail to improve.  An After Visit Summary was printed and given to the patient.  Reinaldo Meeker, MD [facility] [phone]

## 2021-10-04 ENCOUNTER — Other Ambulatory Visit: Payer: Self-pay | Admitting: Legal Medicine

## 2021-10-04 DIAGNOSIS — I25110 Atherosclerotic heart disease of native coronary artery with unstable angina pectoris: Secondary | ICD-10-CM

## 2021-10-05 ENCOUNTER — Telehealth: Payer: Self-pay | Admitting: Student

## 2021-10-05 DIAGNOSIS — M4802 Spinal stenosis, cervical region: Secondary | ICD-10-CM | POA: Diagnosis not present

## 2021-10-05 NOTE — Telephone Encounter (Signed)
Pt called stating that he would like to speak to Sande Rives, PA about fitness for work evaluation and medications. He states he has to come back in for a cardio physical for work so he made an appt 11/11/21 at 3:10pm.   He also states that he is okay with messaging through [place] because he might not answer the phone.

## 2021-10-05 NOTE — Telephone Encounter (Signed)
Returned the call to the patient. He was calling to see if he could get a return to work letter (for full employment).  He has a follow up on 9/7 but would like to know if he could have the letter before that. He will be seeing the neck surgeon today to see if he can be cleared as well.

## 2021-10-06 NOTE — Telephone Encounter (Signed)
Returned call to pt he states that he has been "tweaking" his medications because of his blood pressure is "all over the place". He states that he had to change the Amlodipine and metoprolol. He states that he has systolic CHF and this is why he had to adjust medications. He does have an appt scheduled, it is fine to wait until then. 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

## 2021-10-06 NOTE — Telephone Encounter (Signed)
Tried to call pt, unable to speak at this time he states that he will call back

## 2021-10-06 NOTE — Telephone Encounter (Signed)
He was doing great from a cardiac standpoint at his last visit with me in June so it is fine to go ahead and give him a work letter saying he can return to full employment. If he has any symptoms when he returns to this, he should let us know (he is usually very good about this).  Thank you!

## 2021-10-06 NOTE — Telephone Encounter (Signed)
Spoke with Sande Rives, PA-C she states to write letter today stating "Mr Maines may return to work from a cardiac standpoint with no physical restrictions" letter written and put at the front desk for pt to pick up. Spoke with pt, informed of message above, verbalized understanding. He will come to pick up.

## 2021-10-06 NOTE — Telephone Encounter (Signed)
Late entry: per pt he states that his BP has been running 138-142/84-85 but sometimes in the mid 90's. HR running 61-64's

## 2021-10-30 NOTE — Progress Notes (Signed)
Cardiology Office Note:    Date:  11/11/2021   ID:  Jacob Collier, DOB Jul 17, 1955, MRN [id number]  PCP:  [name], MD  Cardiologist:  [name], MD  Electrophysiologist:  None   Referring MD: [name],*   Chief Complaint: follow-up of hypertension and lower extremity edema   History of Present Illness:    Jacob Collier is a 66 y.o. male with a history of CAD s/p DES to RCA in 01/2019 and then CABG x4 (LIMA to LAD, reverse SVG to PDA, reverse SVG to OM1, and reverse SVG to 1st Diag) in 01/2021, left pleural effusion following CABG s/p thoracentesis, post-op atrial fibrillation not on anticoagulation, paroxysmal SVT noted on monitor in 07/2019, hypertension, hyperlipidemia intolerant to statins, anxiety, prior prostate cancer, and polycythemia who is followed by Dr. [name] and presents today for routine follow-up of hypertension and lower extremity edema.  Patient has a known history of CAD and was previously followed by Cardiology at [facility] and now follows with Dr. [name]. Reviewed records in [place]. Cardiac catheterization in 01/2019 at [facility] showed CTO of LCX and RCA. Patient underwent successful PCI with DES to proximal to mid RCA lesion. Zio monitor was ordered in 02/2019 which showed frequent PVC (8.5% burden) and non-sustained VT. Myoview was therefore ordered to rule out ischemia as the cause. This was done in 05/2019 showed inferolateral wall defect with associated wall motion abnormality compatible with prior MI. He was started on Ranexa but was unable to tolerate this. Patient presented to [facility] later that month with chest pain. Echo showed LVEF of 55-60% with akinesis of the basal inferior and inferoseptal wall and hypokinesis of inferolateral walls. He underwent repeat cardiac catheterization which showed instent CTO of RCA. Medical therapy was recommended with consideration for CABG if unsatisfactory response with  optimization of antianginals.    He presented to [facility] ED in 06/2019 with palpitations and tightness in his neck and was found to be in SVT with rates in the 200s but converted back to sinus rhythm on his own. He was started on Toprol-XL and Event Monitor was ordered which showed underlying sinus rhythm with average heart rate of 65 bpm and occasional PVCs/ventricular couplets, one 4 beat episodes of non-sustained VT, and a 36 second episode of SVT with rates in the 170s. There were no pauses or evidence of atrial fibrillation. Toprol-XL was increased.   At an office visit with me in 12/2020, patient reported recurrent chest pain requiring multiple doses of Nitro. Cardiac catheterization was recommended and showed severe 3 vessel CAD with a new 75% mid LAD lesion compared to prior cath in 2021. Patient was referred to CT surgery for consideratio of CABG. Echo showed LVEF of 55% with hypokinesis of antero-lateral wall, posterior wall, and basal inferior segment as well as grade 2 diastolic dysfunction, mild MR, and mild dilatation of the ascending aorta measuring 41 mm. Patient ultimately underwent CABG x4 with LIMA to LAD, reverse SVG to PDA, reverse SVG to OM1, and reverse SVG to 1st Diag with Dr. [name] on 01/19/2021. Urology placed a foley catheter prior to surgery given history of bulbar urethral stricture and this was kept throughout hospitalization and at time of discharge. He did develop post-op atrial fibrillation and was started on IV Amiodarone and converted back to sinus rhythm. He was discharged on PO Amiodarone but was not started on anticoagulation. Post-op course also complicated by bladder spasms from indwelling foley which  was treated with Oxybutin and phlebitis due to IV infiltration of Amiodarone which was treated with Keflex. Following discharge, he also required thoracentesis on 02/02/2021 for left pleural effusion.  Patient was last seen by me in 08/2021 at which time he was doing great  following his CABG. He was back to working over 40 hours per week and denied any cardiac symptoms. He did mention need for upcoming neck surgery (cervical fusion) and he was felt to be at acceptable risk for this. Since this visit, his BP has been running a little higher and his Amlodipine was increased.  Patient presents today for follow-up. Here with wife. Patient's only concern today is new lower extremity edema that is quite significant at the end of the day. His wife showed me a picture of what his legs looked like one evening and he had significant ankle edema. He has no other CHF symptoms. No shortness of breath, orthopnea, PND. Weight is relatively stable (up 2 lbs from office visit in 08/2021). He denies any chest pain, palpitations, lightheadedness, dizziness, or syncope. He self adjusted his BP medications because his diastolic BP was running high. After last visit, we increased his Amlodipine to '10mg'$  daily but he felt like this was too strong to take at one time. He has been very sensitive to medications in the past and reports his heart rate dropped to the 50s and he felt poorly when he took '10mg'$  of Amlodipine all at once. Therefore, he started taking '5mg'$  three times daily and it sounds like he has been doing this for several weeks now. BP was better controlled with this dosing with BP mostly ranging between 130s-140s/70s to 80s. However, I suspect his lower extremity edema is due to high dose Amlodipine (max dose '10mg'$  daily and he is taking a total of '15mg'$  daily).  Of note, since last visit, he also had neck surgery which went well and he is feeling much better after this. His wife states it has even helped some of his issues with incontinence.    Past Medical History:  Diagnosis Date   Anxiety    Arthritis    Coronary artery disease    s/p prior stenting to RCA. Last cath in 05/2019 showed in-stent CTO of RCA and CTO of LCX (medical therpay recommended)   Diverticulosis    Dyspnea    Hard  of hearing    Hyperlipidemia    Hypertension    hx of elevation on lyrica, off med and now normal    Paroxysmal SVT (supraventricular tachycardia) ([place])    Prostate cancer ([place])    Wears glasses     Past Surgical History:  Procedure Laterality Date   ANTERIOR CERVICAL DECOMP/DISCECTOMY FUSION N/A 09/03/2021   Procedure: CERVICAL FIVE-SIX, CERVICAL SIX-SEVEN ANTERIOR CERVICAL DECOMPRESSION/DISCECTOMY FUSION;  Surgeon: Earnie Larsson, MD;  Location: [place];  Service: Neurosurgery;  Laterality: N/A;   CARDIAC CATHETERIZATION     with 2 stents    COLECTOMY  05/2018   COLONOSCOPY     CORONARY ARTERY BYPASS GRAFT N/A 01/19/2021   Procedure: CORONARY ARTERY BYPASS GRAFTING (CABG), ON PUMP TIMES FOUR, USING LEFT INTERNAL MAMMARY ARTERY AND ENDOSOCPICALLY HARVESTED RIGHT GREATER SAPHENOUS VEIN;  Surgeon: Lajuana Matte, MD;  Location: [place];  Service: Open Heart Surgery;  Laterality: N/A;   CYSTOSCOPY WITH URETHRAL DILATATION N/A 01/14/2021   Procedure: CYSTOSCOPY WITH BALLOON  DILATATION;  Surgeon: Bjorn Loser, MD;  Location: [facility];  Service: Urology;  Laterality: N/A;   EYE SURGERY  02/2020   bilaterl cataracts   IR THORACENTESIS ASP PLEURAL SPACE W/IMG GUIDE  02/02/2021   LEFT HEART CATH AND CORONARY ANGIOGRAPHY N/A 12/24/2020   Procedure: LEFT HEART CATH AND CORONARY ANGIOGRAPHY;  Surgeon: Martinique, Peter M, MD;  Location: [place] [facility];  Service: Cardiovascular;  Laterality: N/A;   PROSTATECTOMY     TEE WITHOUT CARDIOVERSION N/A 01/19/2021   Procedure: TRANSESOPHAGEAL ECHOCARDIOGRAM (TEE);  Surgeon: Lajuana Matte, MD;  Location: [place];  Service: Open Heart Surgery;  Laterality: N/A;   TOTAL HIP ARTHROPLASTY Left 11/10/2015   Procedure: LEFT TOTAL HIP ARTHROPLASTY ANTERIOR APPROACH;  Surgeon: Mcarthur Rossetti, MD;  Location: [place];  Service: Orthopedics;  Laterality: Left;    Current Medications: Current Meds  Medication Sig   acetaminophen (TYLENOL) 500 MG  tablet Take 1,000 mg by mouth every 8 (eight) hours as needed for mild pain.   amoxicillin-clavulanate (AUGMENTIN) 875-125 MG tablet Take 1 tablet by mouth 2 (two) times daily.   aspirin EC 81 MG tablet Take 81 mg by mouth daily. Swallow whole.   clonazePAM (KLONOPIN) 1 MG tablet TAKE 1 TABLET BY MOUTH TWICE A DAY AS NEEDED FOR ANXIETY   clopidogrel (PLAVIX) 75 MG tablet TAKE 1 TABLET (75 MG TOTAL) BY MOUTH DAILY. STOP TAKING PLAVIX ON SATURDAY NOV. 5, 2022   Eszopiclone 3 MG TABS TAKE 1 TABLET BY MOUTH EVERYDAY AT BEDTIME   Evolocumab (REPATHA SURECLICK) 676 MG/ML SOAJ Inject 140 mg into the skin every 14 (fourteen) days.   ezetimibe (ZETIA) 10 MG tablet TAKE 1 TABLET BY MOUTH EVERY DAY   hydrochlorothiazide (HYDRODIURIL) 12.5 MG tablet Take 1 tablet (12.5 mg total) by mouth every morning.   metoprolol succinate (TOPROL-XL) 25 MG 24 hr tablet Take 0.5 tablets (12.5 mg total) by mouth daily. Take with or immediately following a meal. (Patient taking differently: Take 12.5 mg by mouth 2 (two) times daily. Take with or immediately following a meal.)   Omega-3 Fatty Acids (OMEGA 3 PO) Take 1 capsule by mouth 2 (two) times daily.   [DISCONTINUED] amLODipine (NORVASC) 10 MG tablet Take 1 tablet (10 mg total) by mouth daily.     Allergies:   Ranolazine, Atorvastatin, Pravastatin, Rosuvastatin, and Statins   Social History   Socioeconomic History   Marital status: Married    Spouse name: Not on file   Number of children: 0   Years of education: Not on file   Highest education level: Not on file  Occupational History   Occupation: Employed at [employer]  Tobacco Use   Smoking status: Never   Smokeless tobacco: Never  Vaping Use   Vaping Use: Never used  Substance and Sexual Activity   Alcohol use: Not Currently    Alcohol/week: 0.0 standard drinks of alcohol    Comment: rarely   Drug use: No   Sexual activity: Yes    Partners: Female    Birth control/protection: None  Other Topics  Concern   Not on file  Social History Narrative   Not on file   Social Determinants of Health   Financial Resource Strain: Not on file  Food Insecurity: Not on file  Transportation Needs: Not on file  Physical Activity: Not on file  Stress: Not on file  Social Connections: Not on file     Family History: The patient's family history includes Alzheimer's disease in his mother; Cancer in his maternal uncle and maternal uncle; Colon polyps in his brother; Diabetes in his sister; Heart attack  in his maternal grandmother; Prostate cancer in his maternal uncle and maternal uncle; Stroke in his father. There is no history of Colon cancer, Esophageal cancer, Rectal cancer, or Stomach cancer.  ROS:   Please see the history of present illness.     EKGs/Labs/Other Studies Reviewed:    The following studies were reviewed today:  Event Monitor 07/15/2019 to 08/13/2019: The patient was monitored for 30 days from Jul 15, 2019 through August 13, 2019.  The predominant rhythm was sinus rhythm with an average rate at 65 bpm.  The slowest heart rate was sinus bradycardia at 42 bpm which occurred on May 16 at 10:40 AM.  The fastest heart rate was sinus tachycardia at 164 bpm which occurred on May 24 at 1:32 PM.  The patient had occasional PVCs with a ventricular couplet, as well as several episodes of ventricular bigeminal rhythm.  There was an episode of 4 beats of nonsustained ventricular tachycardia on Jul 18, 2019 at 6:58 PM at 192 bpm.  There was a 36-second episode of supraventricular tachycardia with PACs at 174 bpm.  There were no pauses.  There was no episodes of atrial fibrillation. _______________   Left Cardiac Catheterization 12/24/2020:   Prox LAD to Mid LAD lesion is 75% stenosed.   1st Diag lesion is 80% stenosed.   Prox Cx to Mid Cx lesion is 100% stenosed.   Prox RCA to Mid RCA lesion is 100% stenosed.   The left ventricular systolic function is normal.   LV end diastolic pressure is  mildly elevated.   The left ventricular ejection fraction is 50-55% by visual estimate.   There is no aortic valve stenosis.   3 vessel obstructive CAD. 75% mid LAD, 80% first diagonal, 100% mid LCx and 100% proximal RCA. Compared to prior cath in March 2021 the LAD stenosis is new.  Mild LV dysfunction with inferobasal HK. EF 50% Mildly elevated LVEDP   Plan: needs CT surgery evaluation for CABG. Will hold Plavix. Plan to refer to Pharm D for PCSK 9 inhibitor.    Diagnostic Dominance: Right  _______________   Echocardiogram 01/15/2021: Impressions: 1. Left ventricular ejection fraction, by estimation, is 55%. The left  ventricle has normal function. The left ventricle demonstrates regional  wall motion abnormalities (see scoring diagram/findings for description).  There is mild left ventricular  hypertrophy. Left ventricular diastolic parameters are consistent with  Grade II diastolic dysfunction (pseudonormalization).   2. Right ventricular systolic function is normal. The right ventricular  size is normal.   3. Left atrial size was mildly dilated.   4. The mitral valve is grossly normal. Mild mitral valve regurgitation.  No evidence of mitral stenosis.   5. The aortic valve is tricuspid. There is mild calcification of the  aortic valve. Aortic valve regurgitation is not visualized. No aortic  stenosis is present.   6. Aortic dilatation noted. There is mild dilatation of the ascending  aorta, measuring 41 mm.   7. The inferior vena cava is normal in size with greater than 50%  respiratory variability, suggesting right atrial pressure of 3 mmHg.  _______________   Pre-CABG Dopplers 01/15/2021: Summary:  - Right Carotid: Velocities in the right ICA are consistent with a 1-39%  stenosis.  - Left Carotid: Velocities in the left ICA are consistent with a 1-39%  stenosis.  - Vertebrals: Bilateral vertebral arteries demonstrate antegrade flow.    - Right Upper Extremity:  Doppler waveforms remain within normal limits with right radial compression. Doppler waveforms  remain within normal limits with right ulnar compression.  - Left Upper Extremity: Doppler waveforms remain within normal limits with left radial compression. Doppler waveforms decrease 50% with left ulnar compression.   EKG:  EKG not ordered today.   Recent Labs: 01/20/2021: Magnesium 2.2 07/05/2021: ALT 21; TSH 2.450 11/11/2021: BUN WILL FOLLOW; Creatinine, Ser WILL FOLLOW; Hemoglobin 16.4; Platelets 212; Potassium WILL FOLLOW; Sodium WILL FOLLOW  Recent Lipid Panel    Component Value Date/Time   CHOL 75 (L) 07/05/2021 0855   TRIG 94 07/05/2021 0855   HDL 36 (L) 07/05/2021 0855   CHOLHDL 2.1 07/05/2021 0855   CHOLHDL 4.8 06/06/2019 0612   VLDL 29 06/06/2019 0612   LDLCALC 20 07/05/2021 0855    Physical Exam:    Vital Signs: BP 126/82   Pulse 67   Ht '5\' 11"'$  (1.803 m)   Wt 223 lb (101.2 kg)   SpO2 94%   BMI 31.10 kg/m     Wt Readings from Last 3 Encounters:  11/11/21 223 lb (101.2 kg)  11/02/21 210 lb (95.3 kg)  09/28/21 213 lb 9.6 oz (96.9 kg)     General: 66 y.o. Caucasian male in no acute distress. HEENT: Normocephalic and atraumatic. Sclera clear.  Neck: Supple. NNo JVD. Heart: RRR. Distinct S1 and S2. No murmurs, gallops, or rubs.  Lungs: No increased work of breathing. Clear to ausculation bilaterally. No wheezes, rhonchi, or rales.  Abdomen: Soft, non-distended, and non-tender to palpation.  MSK: Normal strength and tone for age. Extremities: 1+ lower extremity edema bilaterally. Skin: Warm and dry. Neuro: Alert and oriented x3. No focal deficits. Psych: Normal affect. Responds appropriately.   Assessment:    1. Coronary artery disease involving native coronary artery of native heart without angina pectoris   2. S/P CABG (coronary artery bypass graft)   3. Post-op atrial fibrillation   4. Paroxysmal SVT (supraventricular tachycardia) ([place])   5. History of  bradycardia   6. Primary hypertension   7. Hyperlipidemia, unspecified hyperlipidemia type   8. Erythrocytosis     Plan:    Lower Extremity Edema Patient's only complaint today is new lower extremity edema which seems to have started after patient self adjusted his BP medications and started taking Amlodipine '5mg'$  three times daily rather than '10mg'$  once daily.  - No other signs or symptoms of CHF. He has 1+ pitting edema of bilateral lower extremities but otherwise appears euvolemic.  - I think this is likely due to taking a very high dose of Amlodipine (max dose '10mg'$  daily and he is taking a total of '15mg'$  daily). Therefore, we will decrease Amlodipine to '5mg'$  daily and will add HCTZ 12.'5mg'$  daily to help with his edema and BP.  - Advised patient to let us know if edema does not improve with this at which time I would order a BNP and likely treat with a short course of Lasix. - I do not think edema is due to a  DVT given edema is symmetrical and it improves with elevation of legs and gets worse throughout the day.   CAD s/p CABG History of CAD s/p DES  to CTO of RCA in 01/2021 and then repeat DES for in-stent restenosis in 05/2019. He had chronic angina following this and ultimately underwent CABG x4 LIMA to LAD, reverse SVG to PDA, reverse SVG to OM1, and reverse SVG to 1st Diag in 01/2021. - Doing well. No chest pain.  - Continue DAPT with Aspirin and  Plavix. - Decrease Amlodipine  to '5mg'$  daily given significant lower extremity edema.  - Continue Toprol-XL 12.'5mg'$  daily. - Intolerant to multiple statins. Continue Zetia and Repatha.   Post-Op Atrial Fibrillation Following CABG. No documented recurrence so not on anticoagulation. He is now off Amiodarone. - Maintaining sinus rhythm on exam.  - Continue Toprol-XL 12.'5mg'$  daily.   Paroxsymal SVT History of Bradycardia Monitor in May/June 2021 showed underlying sinus rhythm with average heart rate of 65 bpm and occasional PVCs/ventricular  couplets, one 4 beat episodes of non-sustained VT, and a 36 second episode of SVT with rates in the 170s. He has also had some bradycardia with rates in the 40s in the past limiting up-titration of beta-blocker.  - Overall well controlled. No recent palpitations. Heart rates in the 60s today. - Continue Toprol-XL 12.'5mg'$  daily.   Hypertension BP initially mildly elevated at 140/74 but improved to 126/82 when rechecked at the the end of the visit. Patient had self adjusted his home medications. He felt Amlodipine '10mg'$  was too strong for him and caused his heart rate to drop if he took it in the morning with his Toprol-XL so he started taking '5mg'$  of Amlodipine three times a day which seemed to help his BP but now he has significant lower extremity edema especially after being on his feet all day. - Decrease Amlodipine to '5mg'$  once daily. Recommended taking this at night. - Will start HCTZ 12.'5mg'$  daily. Recommended taking this in the morning. - Continue Toprol-XL 12.'5mg'$  daily. Recommended taking this in the morning. - Asked patient to keep a BP/HR log for the next 1-2 weeks and then send me a MyChart message with this. - Will check a BMET today and then again in 1-2 weeks to ensure renal function and electrolytes are stable on HCTZ.   Hyperlipidemia Recent lipid panel on 07/05/2021: Total Cholesterol 75, Triglycerides 94, HDL 36, LDL 20. LDL goal <55.  - Intolerant to multiple statins in the past including Lipitor, Crestor, and Pravastatin. Continue Zetia '10mg'$  daily and Repatha.   Erythrocytosis Patient asked me to recheck a CBC today while drawing blood given elevated RBC and hemoglobin around time of neck surgery. RBC was 6.14 and Hgb was 18.4 on 08/26/2021. - Will check CBC as requested.  Disposition: Keep follow-up visit with Dr. [name] in 02/2022.   Medication Adjustments/Labs and Tests Ordered: Current medicines are reviewed at length with the patient today.  Concerns regarding medicines are  outlined above.  Orders Placed This Encounter  Procedures   Basic metabolic panel   CBC   Basic metabolic panel   Meds ordered this encounter  Medications   amLODipine (NORVASC) 5 MG tablet    Sig: Take 1 tablet (5 mg total) by mouth at bedtime.    Dispense:  90 tablet    Refill:  3   hydrochlorothiazide (HYDRODIURIL) 12.5 MG tablet    Sig: Take 1 tablet (12.5 mg total) by mouth every morning.    Dispense:  90 tablet    Refill:  3    Patient Instructions  Medication Instructions:   -Decrease amlodipine (norvasc) '5mg'$  once daily in the evening.  -Start taking hydrochlorothiazide (hydrodiuril) 12.'5mg'$  once in the morning.   *If you need a refill on your cardiac medications before your next appointment, please call your pharmacy*   Lab Work: Your physician recommends that you have labs drawn today: BMET & CBC  Your physician recommends that you return for lab work in: 7-10 days for BMET  If you have labs (blood work)  drawn today and your tests are completely normal, you will receive your results only by: MyChart Message (if you have MyChart) OR A paper copy in the mail If you have any lab test that is abnormal or we need to change your treatment, we will call you to review the results.   Follow-Up: At [facility], you and your health needs are our priority.  As part of our continuing mission to provide you with exceptional heart care, we have created designated Provider Care Teams.  These Care Teams include your primary Cardiologist (physician) and Advanced Practice Providers (APPs -  Physician Assistants and Nurse Practitioners) who all work together to provide you with the care you need, when you need it.  We recommend signing up for the patient portal called "MyChart".  Sign up information is provided on this After Visit Summary.  MyChart is used to connect with patients for Virtual Visits (Telemedicine).  Patients are able to view lab/test results, encounter notes,  upcoming appointments, etc.  Non-urgent messages can be sent to your provider as well.   To learn more about what you can do with MyChart, go to [email or web address].    Your next appointment:   Monday, February 07, 2022 at 10:20am   Provider:   [name], MD     Other Instructions Please keep a blood pressure and heart rate log and send in to [facility] in 1-2 weeks.   Signed, Darreld Mclean, PA-C  11/11/2021 11:59 PM    [facility]

## 2021-11-02 ENCOUNTER — Ambulatory Visit (INDEPENDENT_AMBULATORY_CARE_PROVIDER_SITE_OTHER): Payer: Managed Care, Other (non HMO) | Admitting: Family Medicine

## 2021-11-02 ENCOUNTER — Encounter: Payer: Self-pay | Admitting: Family Medicine

## 2021-11-02 VITALS — BP 130/72 | HR 56 | Temp 97.8°F | Resp 15 | Ht 71.0 in | Wt 210.0 lb

## 2021-11-02 DIAGNOSIS — M2559 Pain in other specified joint: Secondary | ICD-10-CM

## 2021-11-02 DIAGNOSIS — K0500 Acute gingivitis, plaque induced: Secondary | ICD-10-CM | POA: Diagnosis not present

## 2021-11-02 MED ORDER — AMOXICILLIN-POT CLAVULANATE 875-125 MG PO TABS: 1.0000 | ORAL_TABLET | Freq: Two times a day (BID) | ORAL | 0 refills | Status: DC

## 2021-11-02 NOTE — Progress Notes (Signed)
Acute Office Visit  Subjective:    Patient ID: Jacob Collier, male    DOB: [birth year], 66 y.o.   MRN: [id number]  Chief Complaint  Patient presents with   Gingivitis    HPI: Patient is in today for gingivitis since 6 days ago, after he ate canolope and one seed was stocked in the tooth and gum on left top side of the mouth. He has constant aching pain specially when he touches with his tongue.  Patient noticed some join pain lately, especially in his shoulders since one week ago.  Past Medical History:  Diagnosis Date   Anxiety    Arthritis    Coronary artery disease    s/p prior stenting to RCA. Last cath in 05/2019 showed in-stent CTO of RCA and CTO of LCX (medical therpay recommended)   Diverticulosis    Dyspnea    Hard of hearing    Hyperlipidemia    Hypertension    hx of elevation on lyrica, off med and now normal    Paroxysmal SVT (supraventricular tachycardia) ([place])    Prostate cancer ([place])    Wears glasses     Past Surgical History:  Procedure Laterality Date   ANTERIOR CERVICAL DECOMP/DISCECTOMY FUSION N/A 09/03/2021   Procedure: CERVICAL FIVE-SIX, CERVICAL SIX-SEVEN ANTERIOR CERVICAL DECOMPRESSION/DISCECTOMY FUSION;  Surgeon: Earnie Larsson, MD;  Location: [place];  Service: Neurosurgery;  Laterality: N/A;   CARDIAC CATHETERIZATION     with 2 stents    COLECTOMY  05/2018   COLONOSCOPY     CORONARY ARTERY BYPASS GRAFT N/A 01/19/2021   Procedure: CORONARY ARTERY BYPASS GRAFTING (CABG), ON PUMP TIMES FOUR, USING LEFT INTERNAL MAMMARY ARTERY AND ENDOSOCPICALLY HARVESTED RIGHT GREATER SAPHENOUS VEIN;  Surgeon: Lajuana Matte, MD;  Location: [place];  Service: Open Heart Surgery;  Laterality: N/A;   CYSTOSCOPY WITH URETHRAL DILATATION N/A 01/14/2021   Procedure: CYSTOSCOPY WITH BALLOON  DILATATION;  Surgeon: Bjorn Loser, MD;  [facility];  Service: Urology;  Laterality: N/A;   EYE SURGERY  02/2020   bilaterl cataracts   IR THORACENTESIS ASP PLEURAL SPACE  W/IMG GUIDE  02/02/2021   LEFT HEART CATH AND CORONARY ANGIOGRAPHY N/A 12/24/2020   Procedure: LEFT HEART CATH AND CORONARY ANGIOGRAPHY;  Surgeon: Martinique, Peter M, MD;  Location: [place] [facility];  Service: Cardiovascular;  Laterality: N/A;   PROSTATECTOMY     TEE WITHOUT CARDIOVERSION N/A 01/19/2021   Procedure: TRANSESOPHAGEAL ECHOCARDIOGRAM (TEE);  Surgeon: Lajuana Matte, MD;  Location: [place];  Service: Open Heart Surgery;  Laterality: N/A;   TOTAL HIP ARTHROPLASTY Left 11/10/2015   Procedure: LEFT TOTAL HIP ARTHROPLASTY ANTERIOR APPROACH;  Surgeon: Mcarthur Rossetti, MD;  Location: [place];  Service: Orthopedics;  Laterality: Left;    Family History  Problem Relation Age of Onset   Alzheimer's disease Mother    Stroke Father    Diabetes Sister    Heart attack Maternal Grandmother    Cancer Maternal Uncle        prostate   Prostate cancer Maternal Uncle    Cancer Maternal Uncle        prostate   Prostate cancer Maternal Uncle    Colon polyps Brother    Colon cancer Neg Hx    Esophageal cancer Neg Hx    Rectal cancer Neg Hx    Stomach cancer Neg Hx     Social History   Socioeconomic History   Marital status: Married    Spouse name: Not on file  Number of children: 0   Years of education: Not on file   Highest education level: Not on file  Occupational History   Occupation: Employed at [employer]  Tobacco Use   Smoking status: Never   Smokeless tobacco: Never  Vaping Use   Vaping Use: Never used  Substance and Sexual Activity   Alcohol use: Not Currently    Alcohol/week: 0.0 standard drinks of alcohol    Comment: rarely   Drug use: No   Sexual activity: Yes    Partners: Female    Birth control/protection: None  Other Topics Concern   Not on file  Social History Narrative   Not on file   Social Determinants of Health   Financial Resource Strain: Not on file  Food Insecurity: Not on file  Transportation Needs: Not on file  Physical Activity:  Not on file  Stress: Not on file  Social Connections: Not on file  Intimate Partner Violence: Not on file    Outpatient Medications Prior to Visit  Medication Sig Dispense Refill   acetaminophen (TYLENOL) 500 MG tablet Take 1,000 mg by mouth every 8 (eight) hours as needed for mild pain.     amLODipine (NORVASC) 10 MG tablet Take 1 tablet (10 mg total) by mouth daily. 30 tablet 6   aspirin EC 81 MG tablet Take 81 mg by mouth daily. Swallow whole.     clonazePAM (KLONOPIN) 1 MG tablet TAKE 1 TABLET BY MOUTH TWICE A DAY AS NEEDED FOR ANXIETY 60 tablet 3   cyclobenzaprine (FLEXERIL) 10 MG tablet Take 1 tablet (10 mg total) by mouth 3 (three) times daily as needed for muscle spasms. 30 tablet 0   Eszopiclone 3 MG TABS TAKE 1 TABLET BY MOUTH EVERYDAY AT BEDTIME 30 tablet 2   Evolocumab (REPATHA SURECLICK) 841 MG/ML SOAJ Inject 140 mg into the skin every 14 (fourteen) days. 2 mL 11   ezetimibe (ZETIA) 10 MG tablet TAKE 1 TABLET BY MOUTH EVERY DAY 90 tablet 3   naloxegol oxalate (MOVANTIK) 12.5 MG TABS tablet Take 1 tablet (12.5 mg total) by mouth daily. 30 tablet 2   nitroGLYCERIN (NITROSTAT) 0.4 MG SL tablet Place 1 tablet (0.4 mg total) under the tongue every 5 (five) minutes as needed for chest pain. 25 tablet 2   Omega-3 Fatty Acids (OMEGA 3 PO) Take 1 capsule by mouth 2 (two) times daily.     oxyCODONE-acetaminophen (PERCOCET/ROXICET) 5-325 MG tablet take 1-2 tablet(s) by oral route  every 6 hours as needed for pain     SALONPAS PAIN RELIEVING 4 % Place 1 patch onto the skin daily as needed for pain.     clopidogrel (PLAVIX) 75 MG tablet Take 1 tablet (75 mg total) by mouth at bedtime. *Restart Plavix on 09/06/2021*     HYDROcodone-acetaminophen (NORCO) 10-325 MG tablet Take 1 tablet by mouth every 4 (four) hours as needed for severe pain ((score 7 to 10)). 30 tablet 0   metoprolol succinate (TOPROL-XL) 25 MG 24 hr tablet Take 0.5 tablets (12.5 mg total) by mouth daily. Take with or immediately  following a meal. (Patient taking differently: Take 12.5 mg by mouth 2 (two) times daily. Take with or immediately following a meal.) 45 tablet 2   No facility-administered medications prior to visit.    Allergies  Allergen Reactions   Ranolazine     Chest discomfort/Reflux   Atorvastatin     myalgias Other reaction(s): Myalgias (intolerance) myalgias   Pravastatin     myalgias  Other reaction(s): Myalgias (intolerance) myalgias   Rosuvastatin     myalgias Other reaction(s): Myalgias (intolerance) myalgias   Statins Other (See Comments)    myalgias    Review of Systems  Constitutional:  Negative for chills, fatigue, fever and unexpected weight change.  HENT:  Positive for mouth sores (left and top sided of the mouth). Negative for congestion, ear pain, sinus pain and sore throat.   Respiratory:  Negative for cough and shortness of breath.   Cardiovascular:  Negative for chest pain and palpitations.  Gastrointestinal:  Negative for abdominal pain, blood in stool, constipation, diarrhea, nausea and vomiting.  Endocrine: Negative for polydipsia.  Genitourinary:  Negative for dysuria.  Musculoskeletal:  Negative for back pain.  Skin:  Negative for rash.  Neurological:  Negative for headaches.       Objective:    Physical Exam Vitals reviewed.  Constitutional:      Appearance: Normal appearance.  HENT:     Mouth/Throat:     Comments: Left upper gums erythema. Tender.  Neurological:     Mental Status: He is alert.     BP 130/72   Pulse (!) 56   Temp 97.8 F (36.6 C)   Resp 15   Ht 5' 11"  (1.803 m)   Wt 210 lb (95.3 kg)   SpO2 96%   BMI 29.29 kg/m  Wt Readings from Last 3 Encounters:  11/02/21 210 lb (95.3 kg)  09/28/21 213 lb 9.6 oz (96.9 kg)  09/03/21 222 lb 14.4 oz (101.1 kg)    Health Maintenance Due  Topic Date Due   TETANUS/TDAP  Never done   Zoster Vaccines- Shingrix (1 of 2) Never done   Pneumonia Vaccine 67+ Years old (1 - PCV) Never done    INFLUENZA VACCINE  10/05/2021    There are no preventive care reminders to display for this patient.   Lab Results  Component Value Date   TSH 2.450 07/05/2021   Lab Results  Component Value Date   WBC 7.7 08/26/2021   HGB 18.4 (H) 08/26/2021   HCT 54.9 (H) 08/26/2021   MCV 89.4 08/26/2021   PLT 185 08/26/2021   Lab Results  Component Value Date   NA 136 08/26/2021   K 4.5 08/26/2021   CO2 24 08/26/2021   GLUCOSE 92 08/26/2021   BUN 16 08/26/2021   CREATININE 1.21 08/26/2021   BILITOT 0.4 07/05/2021   ALKPHOS 81 07/05/2021   AST 15 07/05/2021   ALT 21 07/05/2021   PROT 6.3 07/05/2021   ALBUMIN 4.3 07/05/2021   CALCIUM 9.3 08/26/2021   ANIONGAP 8 08/26/2021   EGFR 71 07/05/2021   Lab Results  Component Value Date   CHOL 75 (L) 07/05/2021   Lab Results  Component Value Date   HDL 36 (L) 07/05/2021   Lab Results  Component Value Date   LDLCALC 20 07/05/2021   Lab Results  Component Value Date   TRIG 94 07/05/2021   Lab Results  Component Value Date   CHOLHDL 2.1 07/05/2021   Lab Results  Component Value Date   HGBA1C 5.8 (H) 03/04/2021       Assessment & Plan:   Problem List Items Addressed This Visit       Digestive   Acute gingivitis - Primary    Augmentin sent for gums.         Other   Joint pain    Recommend diclofenac gel (voltaren) Salon pas patches (benadryl cream)  Meds ordered this encounter  Medications   amoxicillin-clavulanate (AUGMENTIN) 875-125 MG tablet    Sig: Take 1 tablet by mouth 2 (two) times daily.    Dispense:  20 tablet    Refill:  0    No orders of the defined types were placed in this encounter.  I,Marla I Leal-Borjas,acting as a scribe for Rochel Brome, MD.,have documented all relevant documentation on the behalf of Rochel Brome, MD,as directed by  Rochel Brome, MD while in the presence of Rochel Brome, MD.    Follow-up: No follow-ups on file.  An After Visit Summary was printed and given to the  patient.  Rochel Brome, MD [facility] [phone]

## 2021-11-02 NOTE — Patient Instructions (Signed)
Recommend diclofenac gel (voltaren)  Salon pas patches (benadryl cream)  Augmentin sent for gums.

## 2021-11-03 ENCOUNTER — Other Ambulatory Visit: Payer: Self-pay | Admitting: Physician Assistant

## 2021-11-03 ENCOUNTER — Other Ambulatory Visit: Payer: Self-pay | Admitting: Cardiovascular Disease

## 2021-11-05 DIAGNOSIS — M2550 Pain in unspecified joint: Secondary | ICD-10-CM | POA: Insufficient documentation

## 2021-11-05 DIAGNOSIS — K0500 Acute gingivitis, plaque induced: Secondary | ICD-10-CM | POA: Insufficient documentation

## 2021-11-05 NOTE — Assessment & Plan Note (Addendum)
Recommend diclofenac gel (voltaren) Salon pas patches (benadryl cream)

## 2021-11-05 NOTE — Assessment & Plan Note (Signed)
Augmentin sent for gums.

## 2021-11-11 ENCOUNTER — Ambulatory Visit: Payer: Managed Care, Other (non HMO) | Attending: Student | Admitting: Student

## 2021-11-11 ENCOUNTER — Encounter: Payer: Self-pay | Admitting: Student

## 2021-11-11 VITALS — BP 126/82 | HR 67 | Ht 71.0 in | Wt 223.0 lb

## 2021-11-11 DIAGNOSIS — I480 Paroxysmal atrial fibrillation: Secondary | ICD-10-CM

## 2021-11-11 DIAGNOSIS — I2510 Atherosclerotic heart disease of native coronary artery without angina pectoris: Secondary | ICD-10-CM

## 2021-11-11 DIAGNOSIS — R600 Localized edema: Secondary | ICD-10-CM | POA: Diagnosis not present

## 2021-11-11 DIAGNOSIS — Z951 Presence of aortocoronary bypass graft: Secondary | ICD-10-CM

## 2021-11-11 DIAGNOSIS — Z87898 Personal history of other specified conditions: Secondary | ICD-10-CM

## 2021-11-11 DIAGNOSIS — D751 Secondary polycythemia: Secondary | ICD-10-CM

## 2021-11-11 DIAGNOSIS — I471 Supraventricular tachycardia, unspecified: Secondary | ICD-10-CM

## 2021-11-11 DIAGNOSIS — I10 Essential (primary) hypertension: Secondary | ICD-10-CM

## 2021-11-11 DIAGNOSIS — E785 Hyperlipidemia, unspecified: Secondary | ICD-10-CM

## 2021-11-11 MED ORDER — HYDROCHLOROTHIAZIDE 12.5 MG PO TABS: 12.5000 mg | ORAL_TABLET | ORAL | 3 refills | Status: DC

## 2021-11-11 MED ORDER — AMLODIPINE BESYLATE 5 MG PO TABS: 5.0000 mg | ORAL_TABLET | Freq: Every day | ORAL | 3 refills | Status: DC

## 2021-11-11 NOTE — Patient Instructions (Signed)
Medication Instructions:   -Decrease amlodipine (norvasc) '5mg'$  once daily in the evening.  -Start taking hydrochlorothiazide (hydrodiuril) 12.'5mg'$  once in the morning.   *If you need a refill on your cardiac medications before your next appointment, please call your pharmacy*   Lab Work: Your physician recommends that you have labs drawn today: BMET & CBC  Your physician recommends that you return for lab work in: 7-10 days for BMET  If you have labs (blood work) drawn today and your tests are completely normal, you will receive your results only by: [place] (if you have MyChart) OR A paper copy in the mail If you have any lab test that is abnormal or we need to change your treatment, we will call you to review the results.   Follow-Up: At [facility], you and your health needs are our priority.  As part of our continuing mission to provide you with exceptional heart care, we have created designated Provider Care Teams.  These Care Teams include your primary Cardiologist (physician) and Advanced Practice Providers (APPs -  Physician Assistants and Nurse Practitioners) who all work together to provide you with the care you need, when you need it.  We recommend signing up for the patient portal called "MyChart".  Sign up information is provided on this After Visit Summary.  MyChart is used to connect with patients for Virtual Visits (Telemedicine).  Patients are able to view lab/test results, encounter notes, upcoming appointments, etc.  Non-urgent messages can be sent to your provider as well.   To learn more about what you can do with MyChart, go to [email or web address].    Your next appointment:   Monday, February 07, 2022 at 10:20am   Provider:   [name], MD     Other Instructions Please keep a blood pressure and heart rate log and send in to [facility] in 1-2 weeks.

## 2021-11-12 ENCOUNTER — Encounter: Payer: Self-pay | Admitting: Student

## 2021-11-12 LAB — CBC
Hematocrit: 48.6 % (ref 37.5–51.0)
Hemoglobin: 16.4 g/dL (ref 13.0–17.7)
MCH: 28.8 pg (ref 26.6–33.0)
MCHC: 33.7 g/dL (ref 31.5–35.7)
MCV: 85 fL (ref 79–97)
Platelets: 212 10*3/uL (ref 150–450)
RBC: 5.69 x10E6/uL (ref 4.14–5.80)
RDW: 14 % (ref 11.6–15.4)
WBC: 8 10*3/uL (ref 3.4–10.8)

## 2021-11-12 LAB — BASIC METABOLIC PANEL
BUN/Creatinine Ratio: 14 (ref 10–24)
BUN: 15 mg/dL (ref 8–27)
CO2: 20 mmol/L (ref 20–29)
Calcium: 10 mg/dL (ref 8.6–10.2)
Chloride: 104 mmol/L (ref 96–106)
Creatinine, Ser: 1.04 mg/dL (ref 0.76–1.27)
Glucose: 97 mg/dL (ref 70–99)
Potassium: 4.4 mmol/L (ref 3.5–5.2)
Sodium: 140 mmol/L (ref 134–144)
eGFR: 79 mL/min/{1.73_m2} (ref >59)

## 2021-11-23 ENCOUNTER — Telehealth: Payer: Self-pay

## 2021-11-23 NOTE — Telephone Encounter (Signed)
   Patient Name: Jacob Collier  DOB: 1955-11-04 MRN: [id number]  Primary Cardiologist: [name], MD  Chart reviewed as part of pre-operative protocol coverage. Pre-op clearance already addressed by colleagues in earlier phone notes. To summarize recommendations:  -Patient has a history of DES to RCA in 01/2019.  I think it would be reasonable to hold Plavix x7 days prior to his procedure.  Please resume when medically safe to do so.  We would prefer the patient to be on aspirin perioperatively.  Will route this bundled recommendation to requesting provider via Epic fax function and remove from pre-op pool. Please call with questions.  Elgie Collard, PA-C 11/23/2021, 4:19 PM

## 2021-11-23 NOTE — Telephone Encounter (Signed)
   Pre-operative Risk Assessment    Patient Name: Jacob Collier  DOB: Mar 05, 1956 MRN: [id number]      Request for Surgical Clearance    Procedure:   L5-S1 ESI  Date of Surgery:  Clearance 11/30/21                                 Surgeon:  Dr. Lenord Carbo Surgeon's Group or Practice Name:  [facility] Phone number:  [phone] Fax number:  [phone]   Type of Clearance Requested:   - Pharmacy:  Hold Clopidogrel (Plavix) 7   Type of Anesthesia:  Not Indicated   Additional requests/questions:    Signed, Wonda Horner   11/23/2021, 10:13 AM

## 2021-11-23 NOTE — Telephone Encounter (Signed)
Office calling because states patient needs to be off 7 days instead of 5. Please advise

## 2021-11-23 NOTE — Telephone Encounter (Signed)
   Patient Name: Jacob Collier  DOB: 08-27-1955 MRN: [id number]  Primary Cardiologist: [name], MD  Chart reviewed as part of pre-operative protocol coverage. Pre-op clearance already addressed by colleagues in earlier phone notes. To summarize recommendations:  -Per office protocol, okay to hold Plavix x5 days prior to the procedure.  Please resume when medically safe to do so.  If needing to hold longer than 5 days, will need to reach out to his cardiologist for additional clearance.  Medical clearance not requested  Will route this bundled recommendation to requesting provider via Epic fax function and remove from pre-op pool. Please call with questions.  Elgie Collard, PA-C 11/23/2021, 1:12 PM

## 2021-11-24 ENCOUNTER — Ambulatory Visit: Payer: Managed Care, Other (non HMO)

## 2021-11-24 DIAGNOSIS — Z20822 Contact with and (suspected) exposure to covid-19: Secondary | ICD-10-CM

## 2021-11-24 LAB — BASIC METABOLIC PANEL
BUN/Creatinine Ratio: 13 (ref 10–24)
BUN: 14 mg/dL (ref 8–27)
CO2: 25 mmol/L (ref 20–29)
Calcium: 9.6 mg/dL (ref 8.6–10.2)
Chloride: 98 mmol/L (ref 96–106)
Creatinine, Ser: 1.11 mg/dL (ref 0.76–1.27)
Glucose: 112 mg/dL — ABNORMAL HIGH (ref 70–99)
Potassium: 4.2 mmol/L (ref 3.5–5.2)
Sodium: 142 mmol/L (ref 134–144)
eGFR: 73 mL/min/{1.73_m2} (ref >59)

## 2021-11-24 LAB — POC COVID19 BINAXNOW: SARS Coronavirus 2 Ag: NEGATIVE

## 2021-11-24 NOTE — Progress Notes (Signed)
Negative covid test, can RTW lp

## 2021-11-25 ENCOUNTER — Other Ambulatory Visit: Payer: Self-pay | Admitting: Legal Medicine

## 2021-11-25 DIAGNOSIS — F5101 Primary insomnia: Secondary | ICD-10-CM

## 2021-12-02 ENCOUNTER — Other Ambulatory Visit: Payer: Self-pay

## 2021-12-02 ENCOUNTER — Other Ambulatory Visit: Payer: Self-pay | Admitting: Cardiovascular Disease

## 2021-12-02 DIAGNOSIS — F5101 Primary insomnia: Secondary | ICD-10-CM

## 2021-12-03 MED ORDER — ESZOPICLONE 3 MG PO TABS: ORAL_TABLET | ORAL | 2 refills | Status: DC

## 2021-12-16 ENCOUNTER — Telehealth (INDEPENDENT_AMBULATORY_CARE_PROVIDER_SITE_OTHER): Payer: Managed Care, Other (non HMO) | Admitting: Nurse Practitioner

## 2021-12-16 ENCOUNTER — Encounter: Payer: Self-pay | Admitting: Nurse Practitioner

## 2021-12-16 DIAGNOSIS — J018 Other acute sinusitis: Secondary | ICD-10-CM

## 2021-12-16 MED ORDER — PROMETHAZINE-DM 6.25-15 MG/5ML PO SYRP: 5.0000 mL | ORAL_SOLUTION | Freq: Four times a day (QID) | ORAL | 0 refills | Status: DC | PRN

## 2021-12-16 MED ORDER — AZITHROMYCIN 250 MG PO TABS: ORAL_TABLET | ORAL | 0 refills | Status: AC

## 2021-12-16 MED ORDER — FLUTICASONE PROPIONATE 50 MCG/ACT NA SUSP: 2.0000 | Freq: Every day | NASAL | 6 refills | Status: DC

## 2021-12-16 NOTE — Progress Notes (Signed)
Virtual Visit via Telephone Note   This visit type was conducted due to national recommendations for restrictions regarding the COVID-19 Pandemic (e.g. social distancing) in an effort to limit this patient's exposure and mitigate transmission in our community.  Due to his co-morbid illnesses, this patient is at least at moderate risk for complications without adequate follow up.  This format is felt to be most appropriate for this patient at this time.  The patient did not have access to video technology/had technical difficulties with video requiring transitioning to audio format only (telephone).  All issues noted in this document were discussed and addressed.  No physical exam could be performed with this format.  Patient verbally consented to a telehealth visit.   Date:  12/16/2021   ID:  Jacob Collier, DOB [birth year], MRN [id number]  Patient Location: Home Provider Location: Office/Clinic  PCP:  [name], MD   Evaluation Performed:  Established patient, acute telemedicine   Chief Complaint:  URI  History of Present Illness:    Jacob Collier is a 66 y.o. male with Upper respiratory symptoms He complains of congestion, nasal congestion, non productive cough, and sore throat. States he felt that he had a fever but could not locate thermometer to measure temperature. Denies rash or body aches. Reports COIVD-19 home test was negative. Onset of symptoms was yesterday and staying constant.He is drinking plenty of fluids.  Past history is significant for  CABG, CAD . Patient is non-smoker   The patient does have symptoms concerning for COVID-19 infection (fever, chills, cough, or new shortness of breath).    Past Medical History:  Diagnosis Date   Anxiety    Arthritis    Coronary artery disease    s/p prior stenting to RCA. Last cath in 05/2019 showed in-stent CTO of RCA and CTO of LCX (medical therpay recommended)   Diverticulosis    Dyspnea    Hard of hearing     Hyperlipidemia    Hypertension    hx of elevation on lyrica, off med and now normal    Paroxysmal SVT (supraventricular tachycardia) ([place])    Prostate cancer ([place])    Wears glasses     Past Surgical History:  Procedure Laterality Date   ANTERIOR CERVICAL DECOMP/DISCECTOMY FUSION N/A 09/03/2021   Procedure: CERVICAL FIVE-SIX, CERVICAL SIX-SEVEN ANTERIOR CERVICAL DECOMPRESSION/DISCECTOMY FUSION;  Surgeon: Earnie Larsson, MD;  Location: [place];  Service: Neurosurgery;  Laterality: N/A;   CARDIAC CATHETERIZATION     with 2 stents    COLECTOMY  05/2018   COLONOSCOPY     CORONARY ARTERY BYPASS GRAFT N/A 01/19/2021   Procedure: CORONARY ARTERY BYPASS GRAFTING (CABG), ON PUMP TIMES FOUR, USING LEFT INTERNAL MAMMARY ARTERY AND ENDOSOCPICALLY HARVESTED RIGHT GREATER SAPHENOUS VEIN;  Surgeon: Lajuana Matte, MD;  Location: [place];  Service: Open Heart Surgery;  Laterality: N/A;   CYSTOSCOPY WITH URETHRAL DILATATION N/A 01/14/2021   Procedure: CYSTOSCOPY WITH BALLOON  DILATATION;  Surgeon: Bjorn Loser, MD;  [facility];  Service: Urology;  Laterality: N/A;   EYE SURGERY  02/2020   bilaterl cataracts   IR THORACENTESIS ASP PLEURAL SPACE W/IMG GUIDE  02/02/2021   LEFT HEART CATH AND CORONARY ANGIOGRAPHY N/A 12/24/2020   Procedure: LEFT HEART CATH AND CORONARY ANGIOGRAPHY;  Surgeon: Martinique, Peter M, MD;  Location: [place] [facility];  Service: Cardiovascular;  Laterality: N/A;   PROSTATECTOMY     TEE WITHOUT CARDIOVERSION N/A 01/19/2021   Procedure: TRANSESOPHAGEAL ECHOCARDIOGRAM (TEE);  Surgeon:  Lajuana Matte, MD;  Location: [place];  Service: Open Heart Surgery;  Laterality: N/A;   TOTAL HIP ARTHROPLASTY Left 11/10/2015   Procedure: LEFT TOTAL HIP ARTHROPLASTY ANTERIOR APPROACH;  Surgeon: Mcarthur Rossetti, MD;  Location: [place];  Service: Orthopedics;  Laterality: Left;    Family History  Problem Relation Age of Onset   Alzheimer's disease Mother    Stroke Father     Diabetes Sister    Heart attack Maternal Grandmother    Cancer Maternal Uncle        prostate   Prostate cancer Maternal Uncle    Cancer Maternal Uncle        prostate   Prostate cancer Maternal Uncle    Colon polyps Brother    Colon cancer Neg Hx    Esophageal cancer Neg Hx    Rectal cancer Neg Hx    Stomach cancer Neg Hx     Social History   Socioeconomic History   Marital status: Married    Spouse name: Not on file   Number of children: 0   Years of education: Not on file   Highest education level: Not on file  Occupational History   Occupation: Employed at [employer]  Tobacco Use   Smoking status: Never   Smokeless tobacco: Never  Vaping Use   Vaping Use: Never used  Substance and Sexual Activity   Alcohol use: Not Currently    Alcohol/week: 0.0 standard drinks of alcohol    Comment: rarely   Drug use: No   Sexual activity: Yes    Partners: Female    Birth control/protection: None  Other Topics Concern   Not on file  Social History Narrative   Not on file   Social Determinants of Health   Financial Resource Strain: Not on file  Food Insecurity: Not on file  Transportation Needs: Not on file  Physical Activity: Not on file  Stress: Not on file  Social Connections: Not on file  Intimate Partner Violence: Not on file    Outpatient Medications Prior to Visit  Medication Sig Dispense Refill   acetaminophen (TYLENOL) 500 MG tablet Take 1,000 mg by mouth every 8 (eight) hours as needed for mild pain.     amLODipine (NORVASC) 5 MG tablet Take 1 tablet (5 mg total) by mouth at bedtime. 90 tablet 3   amoxicillin-clavulanate (AUGMENTIN) 875-125 MG tablet Take 1 tablet by mouth 2 (two) times daily. 20 tablet 0   aspirin EC 81 MG tablet Take 81 mg by mouth daily. Swallow whole.     clonazePAM (KLONOPIN) 1 MG tablet TAKE 1 TABLET BY MOUTH TWICE A DAY AS NEEDED FOR ANXIETY 60 tablet 3   clopidogrel (PLAVIX) 75 MG tablet TAKE 1 TABLET (75 MG TOTAL) BY MOUTH DAILY.  STOP TAKING PLAVIX ON SATURDAY NOV. 5, 2022 90 tablet 3   cyclobenzaprine (FLEXERIL) 10 MG tablet Take 1 tablet (10 mg total) by mouth 3 (three) times daily as needed for muscle spasms. (Patient not taking: Reported on 11/11/2021) 30 tablet 0   Eszopiclone 3 MG TABS TAKE 1 TABLET BY MOUTH EVERYDAY AT BEDTIME 30 tablet 2   Evolocumab (REPATHA SURECLICK) 250 MG/ML SOAJ Inject 140 mg into the skin every 14 (fourteen) days. 2 mL 11   ezetimibe (ZETIA) 10 MG tablet TAKE 1 TABLET BY MOUTH EVERY DAY 90 tablet 3   hydrochlorothiazide (HYDRODIURIL) 12.5 MG tablet Take 1 tablet (12.5 mg total) by mouth every morning. 90 tablet 3   metoprolol  succinate (TOPROL-XL) 25 MG 24 hr tablet Take 0.5 tablets (12.5 mg total) by mouth daily. Take with or immediately following a meal. (Patient taking differently: Take 12.5 mg by mouth 2 (two) times daily. Take with or immediately following a meal.) 45 tablet 2   naloxegol oxalate (MOVANTIK) 12.5 MG TABS tablet Take 1 tablet (12.5 mg total) by mouth daily. (Patient not taking: Reported on 11/11/2021) 30 tablet 2   nitroGLYCERIN (NITROSTAT) 0.4 MG SL tablet Place 1 tablet (0.4 mg total) under the tongue every 5 (five) minutes as needed for chest pain. (Patient not taking: Reported on 11/11/2021) 25 tablet 2   Omega-3 Fatty Acids (OMEGA 3 PO) Take 1 capsule by mouth 2 (two) times daily.     SALONPAS PAIN RELIEVING 4 % Place 1 patch onto the skin daily as needed for pain. (Patient not taking: Reported on 11/11/2021)     No facility-administered medications prior to visit.    Allergies:   Ranolazine, Atorvastatin, Pravastatin, Rosuvastatin, and Statins   Social History   Tobacco Use   Smoking status: Never   Smokeless tobacco: Never  Vaping Use   Vaping Use: Never used  Substance Use Topics   Alcohol use: Not Currently    Alcohol/week: 0.0 standard drinks of alcohol    Comment: rarely   Drug use: No     ROS  See pertinent positives and negatives per HPI.  Labs/Other  Tests and Data Reviewed:    Recent Labs: 01/20/2021: Magnesium 2.2 07/05/2021: ALT 21; TSH 2.450 11/11/2021: Hemoglobin 16.4; Platelets 212 11/23/2021: BUN 14; Creatinine, Ser 1.11; Potassium 4.2; Sodium 142   Recent Lipid Panel Lab Results  Component Value Date/Time   CHOL 75 (L) 07/05/2021 08:55 AM   TRIG 94 07/05/2021 08:55 AM   HDL 36 (L) 07/05/2021 08:55 AM   CHOLHDL 2.1 07/05/2021 08:55 AM   CHOLHDL 4.8 06/06/2019 06:12 AM   LDLCALC 20 07/05/2021 08:55 AM    Wt Readings from Last 3 Encounters:  11/11/21 223 lb (101.2 kg)  11/02/21 210 lb (95.3 kg)  09/28/21 213 lb 9.6 oz (96.9 kg)     Objective:    Vital Signs:  There were no vitals taken for this visit.    Physical Exam No physical exam due to telemedicine visit  ASSESSMENT & PLAN:     1. Acute non-recurrent sinusitis of other sinus - azithromycin (ZITHROMAX) 250 MG tablet; Take 2 tablets on day 1, then 1 tablet daily on days 2 through 5  Dispense: 6 tablet; Refill: 0 - fluticasone (FLONASE) 50 MCG/ACT nasal spray; Place 2 sprays into both nostrils daily.  Dispense: 16 g; Refill: 6 - promethazine-dextromethorphan (PROMETHAZINE-DM) 6.25-15 MG/5ML syrup; Take 5 mLs by mouth 4 (four) times daily as needed.  Dispense: 118 mL; Refill: 0   Take Z-pack as prescribed Use Flonase nasal spray daily Take Promethazine-DM as needed for cough, nasal congestions Rest and push fluids Follow-up as needed    COVID-19 Education: The signs and symptoms of COVID-19 were discussed with the patient and how to seek care for testing (follow up with PCP or arrange E-visit). The importance of social distancing was discussed today.   I spent 15 minutes dedicated to the care of this patient on the date of this encounter to include face-to-face time with the patient, as well as: EMR review and prescription medication management.   Follow Up:  In Person prn  Signed,  Jerrell Belfast, DNP  12/16/2021 2:08 pm    [place]

## 2021-12-16 NOTE — Patient Instructions (Signed)
Take Z-pack as prescribed Use Flonase nasal spray daily Take Promethazine-DM as needed for cough, nasal congestions Rest and push fluids Follow-up as needed   Sinus Infection, Adult A sinus infection is soreness and swelling (inflammation) of your sinuses. Sinuses are hollow spaces in the bones around your face. They are located: Around your eyes. In the middle of your forehead. Behind your nose. In your cheekbones. Your sinuses and nasal passages are lined with a fluid called mucus. Mucus drains out of your sinuses. Swelling can trap mucus in your sinuses. This lets germs (bacteria, virus, or fungus) grow, which leads to infection. Most of the time, this condition is caused by a virus. What are the causes? Allergies. Asthma. Germs. Things that block your nose or sinuses. Growths in the nose (nasal polyps). Chemicals or irritants in the air. A fungus. This is rare. What increases the risk? Having a weak body defense system (immune system). Doing a lot of swimming or diving. Using nasal sprays too much. Smoking. What are the signs or symptoms? The main symptoms of this condition are pain and a feeling of pressure around the sinuses. Other symptoms include: Stuffy nose (congestion). This may make it hard to breathe through your nose. Runny nose (drainage). Soreness, swelling, and warmth in the sinuses. A cough that may get worse at night. Being unable to smell and taste. Mucus that collects in the throat or the back of the nose (postnasal drip). This may cause a sore throat or bad breath. Being very tired (fatigued). A fever. How is this diagnosed? Your symptoms. Your medical history. A physical exam. Tests to find out if your condition is short-term (acute) or long-term (chronic). Your doctor may: Check your nose for growths (polyps). Check your sinuses using a tool that has a light on one end (endoscope). Check for allergies or germs. Do imaging tests, such as an MRI or  CT scan. How is this treated? Treatment for this condition depends on the cause and whether it is short-term or long-term. If caused by a virus, your symptoms should go away on their own within 10 days. You may be given medicines to relieve symptoms. They include: Medicines that shrink swollen tissue in the nose. A spray that treats swelling of the nostrils. Rinses that help get rid of thick mucus in your nose (nasal saline washes). Medicines that treat allergies (antihistamines). Over-the-counter pain relievers. If caused by bacteria, your doctor may wait to see if you will get better without treatment. You may be given antibiotic medicine if you have: A very bad infection. A weak body defense system. If caused by growths in the nose, surgery may be needed. Follow these instructions at home: Medicines Take, use, or apply over-the-counter and prescription medicines only as told by your doctor. These may include nasal sprays. If you were prescribed an antibiotic medicine, take it as told by your doctor. Do not stop taking it even if you start to feel better. Hydrate and humidify  Drink enough water to keep your pee (urine) pale yellow. Use a cool mist humidifier to keep the humidity level in your home above 50%. Breathe in steam for 10-15 minutes, 3-4 times a day, or as told by your doctor. You can do this in the bathroom while a hot shower is running. Try not to spend time in cool or dry air. Rest Rest as much as you can. Sleep with your head raised (elevated). Make sure you get enough sleep each night. General instructions  Put  a warm, moist washcloth on your face 3-4 times a day, or as often as told by your doctor. Use nasal saline washes as often as told by your doctor. Wash your hands often with soap and water. If you cannot use soap and water, use hand sanitizer. Do not smoke. Avoid being around people who are smoking (secondhand smoke). Keep all follow-up visits. Contact a  doctor if: You have a fever. Your symptoms get worse. Your symptoms do not get better within 10 days. Get help right away if: You have a very bad headache. You cannot stop vomiting. You have very bad pain or swelling around your face or eyes. You have trouble seeing. You feel confused. Your neck is stiff. You have trouble breathing. These symptoms may be an emergency. Get help right away. Call 911. Do not wait to see if the symptoms will go away. Do not drive yourself to the hospital. Summary A sinus infection is swelling of your sinuses. Sinuses are hollow spaces in the bones around your face. This condition is caused by tissues in your nose that become inflamed or swollen. This traps germs. These can lead to infection. If you were prescribed an antibiotic medicine, take it as told by your doctor. Do not stop taking it even if you start to feel better. Keep all follow-up visits. This information is not intended to replace advice given to you by your health care provider. Make sure you discuss any questions you have with your health care provider. Document Revised: 01/26/2021 Document Reviewed: 01/26/2021 Elsevier Patient Education  [place].

## 2021-12-20 ENCOUNTER — Other Ambulatory Visit: Payer: Self-pay | Admitting: Cardiovascular Disease

## 2021-12-21 ENCOUNTER — Encounter: Payer: Self-pay | Admitting: Nurse Practitioner

## 2021-12-21 ENCOUNTER — Ambulatory Visit (INDEPENDENT_AMBULATORY_CARE_PROVIDER_SITE_OTHER): Payer: Managed Care, Other (non HMO) | Admitting: Nurse Practitioner

## 2021-12-21 VITALS — BP 136/78 | HR 75 | Temp 97.4°F | Ht 71.0 in | Wt 224.0 lb

## 2021-12-21 DIAGNOSIS — R5383 Other fatigue: Secondary | ICD-10-CM

## 2021-12-21 DIAGNOSIS — B370 Candidal stomatitis: Secondary | ICD-10-CM | POA: Diagnosis not present

## 2021-12-21 MED ORDER — FLUCONAZOLE 150 MG PO TABS: 150.0000 mg | ORAL_TABLET | Freq: Once | ORAL | 0 refills | Status: AC

## 2021-12-21 NOTE — Progress Notes (Deleted)
Acute Office Visit  Subjective:    Patient ID: Jacob Collier, male    DOB: 04-Mar-1956, 66 y.o.   MRN: [id number]  Chief Complaint  Patient presents with   Fatigue    HPI: Patient is in today for fatigue, states he would like to have labs drawn and wants his PSA rechecked  Past Medical History:  Diagnosis Date   Anxiety    Arthritis    Coronary artery disease    s/p prior stenting to RCA. Last cath in 05/2019 showed in-stent CTO of RCA and CTO of LCX (medical therpay recommended)   Diverticulosis    Dyspnea    Hard of hearing    Hyperlipidemia    Hypertension    hx of elevation on lyrica, off med and now normal    Paroxysmal SVT (supraventricular tachycardia)    Prostate cancer (HCC)    Wears glasses     Past Surgical History:  Procedure Laterality Date   ANTERIOR CERVICAL DECOMP/DISCECTOMY FUSION N/A 09/03/2021   Procedure: CERVICAL FIVE-SIX, CERVICAL SIX-SEVEN ANTERIOR CERVICAL DECOMPRESSION/DISCECTOMY FUSION;  Surgeon: Earnie Larsson, MD;  Location: [facility];  Service: Neurosurgery;  Laterality: N/A;   CARDIAC CATHETERIZATION     with 2 stents    COLECTOMY  05/2018   COLONOSCOPY     CORONARY ARTERY BYPASS GRAFT N/A 01/19/2021   Procedure: CORONARY ARTERY BYPASS GRAFTING (CABG), ON PUMP TIMES FOUR, USING LEFT INTERNAL MAMMARY ARTERY AND ENDOSOCPICALLY HARVESTED RIGHT GREATER SAPHENOUS VEIN;  Surgeon: Lajuana Matte, MD;  Location: [facility];  Service: Open Heart Surgery;  Laterality: N/A;   CYSTOSCOPY WITH URETHRAL DILATATION N/A 01/14/2021   Procedure: CYSTOSCOPY WITH BALLOON  DILATATION;  Surgeon: Bjorn Loser, MD;  Location: [facility];  Service: Urology;  Laterality: N/A;   EYE SURGERY  02/2020   bilaterl cataracts   IR THORACENTESIS ASP PLEURAL SPACE W/IMG GUIDE  02/02/2021   LEFT HEART CATH AND CORONARY ANGIOGRAPHY N/A 12/24/2020   Procedure: LEFT HEART CATH AND CORONARY ANGIOGRAPHY;  Surgeon: Martinique, Peter M, MD;  Location: [facility];  Service:  Cardiovascular;  Laterality: N/A;   PROSTATECTOMY     TEE WITHOUT CARDIOVERSION N/A 01/19/2021   Procedure: TRANSESOPHAGEAL ECHOCARDIOGRAM (TEE);  Surgeon: Lajuana Matte, MD;  Location: [facility];  Service: Open Heart Surgery;  Laterality: N/A;   TOTAL HIP ARTHROPLASTY Left 11/10/2015   Procedure: LEFT TOTAL HIP ARTHROPLASTY ANTERIOR APPROACH;  Surgeon: Mcarthur Rossetti, MD;  Location: [facility];  Service: Orthopedics;  Laterality: Left;    Family History  Problem Relation Age of Onset   Alzheimer's disease Mother    Stroke Father    Diabetes Sister    Heart attack Maternal Grandmother    Cancer Maternal Uncle        prostate   Prostate cancer Maternal Uncle    Cancer Maternal Uncle        prostate   Prostate cancer Maternal Uncle    Colon polyps Brother    Colon cancer Neg Hx    Esophageal cancer Neg Hx    Rectal cancer Neg Hx    Stomach cancer Neg Hx     Social History   Socioeconomic History   Marital status: Married    Spouse name: Not on file   Number of children: 0   Years of education: Not on file   Highest education level: Not on file  Occupational History   Occupation: Employed at [employer]  Tobacco Use   Smoking status: Never  Smokeless tobacco: Never  Vaping Use   Vaping Use: Never used  Substance and Sexual Activity   Alcohol use: Not Currently    Alcohol/week: 0.0 standard drinks of alcohol    Comment: rarely   Drug use: No   Sexual activity: Yes    Partners: Female    Birth control/protection: None  Other Topics Concern   Not on file  Social History Narrative   Not on file   Social Determinants of Health   Financial Resource Strain: Not on file  Food Insecurity: Not on file  Transportation Needs: Not on file  Physical Activity: Not on file  Stress: Not on file  Social Connections: Not on file  Intimate Partner Violence: Not on file    Outpatient Medications Prior to Visit  Medication Sig Dispense Refill   acetaminophen  (TYLENOL) 500 MG tablet Take 1,000 mg by mouth every 8 (eight) hours as needed for mild pain.     amLODipine (NORVASC) 5 MG tablet Take 1 tablet (5 mg total) by mouth at bedtime. 90 tablet 3   aspirin EC 81 MG tablet Take 81 mg by mouth daily. Swallow whole.     azithromycin (ZITHROMAX) 250 MG tablet Take 2 tablets on day 1, then 1 tablet daily on days 2 through 5 6 tablet 0   clonazePAM (KLONOPIN) 1 MG tablet TAKE 1 TABLET BY MOUTH TWICE A DAY AS NEEDED FOR ANXIETY 60 tablet 3   clopidogrel (PLAVIX) 75 MG tablet TAKE 1 TABLET (75 MG TOTAL) BY MOUTH DAILY. STOP TAKING PLAVIX ON SATURDAY NOV. 5, 2022 90 tablet 3   Eszopiclone 3 MG TABS TAKE 1 TABLET BY MOUTH EVERYDAY AT BEDTIME 30 tablet 2   Evolocumab (REPATHA SURECLICK) 884 MG/ML SOAJ Inject 140 mg into the skin every 14 (fourteen) days. 2 mL 11   ezetimibe (ZETIA) 10 MG tablet TAKE 1 TABLET BY MOUTH EVERY DAY 90 tablet 3   fluticasone (FLONASE) 50 MCG/ACT nasal spray Place 2 sprays into both nostrils daily. 16 g 6   metoprolol succinate (TOPROL-XL) 25 MG 24 hr tablet Take 0.5 tablets (12.5 mg total) by mouth daily. Take with or immediately following a meal. (Patient taking differently: Take 12.5 mg by mouth 2 (two) times daily. Take with or immediately following a meal.) 45 tablet 2   nitroGLYCERIN (NITROSTAT) 0.4 MG SL tablet Place 1 tablet (0.4 mg total) under the tongue every 5 (five) minutes as needed for chest pain. (Patient not taking: Reported on 11/11/2021) 25 tablet 2   Omega-3 Fatty Acids (OMEGA 3 PO) Take 1 capsule by mouth 2 (two) times daily.     promethazine-dextromethorphan (PROMETHAZINE-DM) 6.25-15 MG/5ML syrup Take 5 mLs by mouth 4 (four) times daily as needed. 118 mL 0   cyclobenzaprine (FLEXERIL) 10 MG tablet Take 1 tablet (10 mg total) by mouth 3 (three) times daily as needed for muscle spasms. (Patient not taking: Reported on 11/11/2021) 30 tablet 0   hydrochlorothiazide (HYDRODIURIL) 12.5 MG tablet Take 1 tablet (12.5 mg total) by  mouth every morning. 90 tablet 3   naloxegol oxalate (MOVANTIK) 12.5 MG TABS tablet Take 1 tablet (12.5 mg total) by mouth daily. (Patient not taking: Reported on 11/11/2021) 30 tablet 2   SALONPAS PAIN RELIEVING 4 % Place 1 patch onto the skin daily as needed for pain. (Patient not taking: Reported on 11/11/2021)     No facility-administered medications prior to visit.    Allergies  Allergen Reactions   Ranolazine     Chest  discomfort/Reflux   Atorvastatin     myalgias Other reaction(s): Myalgias (intolerance) myalgias   Pravastatin     myalgias Other reaction(s): Myalgias (intolerance) myalgias   Rosuvastatin     myalgias Other reaction(s): Myalgias (intolerance) myalgias   Statins Other (See Comments)    myalgias    Review of Systems     Objective:    Physical Exam  Pulse 75   Temp (!) 97.4 F (36.3 C)   Ht _0  (1.803 m)   Wt 224 lb (101.6 kg)   SpO2 98%   BMI 31.24 kg/m  Wt Readings from Last 3 Encounters:  12/21/21 224 lb (101.6 kg)  11/11/21 223 lb (101.2 kg)  11/02/21 210 lb (95.3 kg)    Health Maintenance Due  Topic Date Due   TETANUS/TDAP  Never done   Zoster Vaccines- Shingrix (1 of 2) Never done   Pneumonia Vaccine 34+ Years old (1 - PCV) Never done   INFLUENZA VACCINE  10/05/2021    There are no preventive care reminders to display for this patient.   Lab Results  Component Value Date   TSH 2.450 07/05/2021   Lab Results  Component Value Date   WBC 8.0 11/11/2021   HGB 16.4 11/11/2021   HCT 48.6 11/11/2021   MCV 85 11/11/2021   PLT 212 11/11/2021   Lab Results  Component Value Date   NA 142 11/23/2021   K 4.2 11/23/2021   CO2 25 11/23/2021   GLUCOSE 112 (H) 11/23/2021   BUN 14 11/23/2021   CREATININE 1.11 11/23/2021   BILITOT 0.4 07/05/2021   ALKPHOS 81 07/05/2021   AST 15 07/05/2021   ALT 21 07/05/2021   PROT 6.3 07/05/2021   ALBUMIN 4.3 07/05/2021   CALCIUM 9.6 11/23/2021   ANIONGAP 8 08/26/2021   EGFR 73 11/23/2021    Lab Results  Component Value Date   CHOL 75 (L) 07/05/2021   Lab Results  Component Value Date   HDL 36 (L) 07/05/2021   Lab Results  Component Value Date   LDLCALC 20 07/05/2021   Lab Results  Component Value Date   TRIG 94 07/05/2021   Lab Results  Component Value Date   CHOLHDL 2.1 07/05/2021   Lab Results  Component Value Date   HGBA1C 5.8 (H) 03/04/2021       Assessment & Plan:   Problem List Items Addressed This Visit   None  No orders of the defined types were placed in this encounter.   No orders of the defined types were placed in this encounter.    Follow-up: No follow-ups on file.  An After Visit Summary was printed and given to the patient.  Rip Harbour, NP [place] [phone]

## 2021-12-21 NOTE — Patient Instructions (Addendum)
Take Diflucan 150 mg by mouth one time for oral yeast infection We will call you with lab results Follow-up with Dr Henrene Pastor on 12/29/21 at 2:00 Contact Cardiology concerning cardiac medications  Oral Thrush, Adult Oral thrush is an infection in your mouth and throat and on your tongue. It causes white patches to form in your mouth and on your tongue. Many cases of thrush are mild. But, sometimes, thrush can be serious. People who have a weak body defense system (immune system) or other diseases can be affected more. What are the causes? This condition is caused by a type of fungus called yeast. The fungus is normally present in small amounts in the mouth and nose. If a person has a long-term illness or a weak body defense system, the fungus can grow and spread quickly. This causes thrush. What increases the risk? You are more likely to develop this condition if: You have a weak body defense system. You are an older adult. You have diabetes, cancer, or HIV. You have a dry mouth. You are pregnant or breastfeeding. You do not take good care of your teeth. This risk is greater for people who have false teeth (dentures). You use antibiotic or steroid medicines. What are the signs or symptoms? Symptoms of this condition include: A burning feeling in the mouth and throat. White patches that stick to the mouth and tongue. A bad taste in the mouth or trouble tasting foods. A feeling like you have cotton in your mouth. Pain when you eat and swallow. Not wanting to eat as much as usual. Cracking at the corners of the mouth. How is this treated? This condition is treated with medicines called antifungals. These medicines prevent a fungus from growing. The medicines are either put right on the area (topical) or swallowed (oral). Your doctor will also treat other problems that you may have, such as diabetes or HIV. Follow these instructions at home: Helping with pain and soreness To lessen your  pain: Drink cold liquids, like water and iced tea. Eat frozen ice pops or frozen juices. Eat foods that are easy to swallow, like gelatin and ice cream. Drink from a straw if you have too much pain in your mouth.  General instructions Take or use over-the-counter and prescription medicines only as told by your doctor. Eat plain yogurt that has live cultures in it. Read the label to make sure that there are live cultures in your yogurt. If you wear false teeth: Take them out before you go to bed. Brush them well. Soak them in a cleaner. Rinse your mouth with warm salt-water many times a day. To make the salt-water mixture, dissolve -1 teaspoon (3-6 g) of salt in 1 cup (237 mL) of warm water. Contact a doctor if: Your problems do not get better within 7 days of treatment. Your infection is spreading. This may show as white areas on the skin outside of your mouth. You are nursing your baby and you have redness and pain in the nipples. Summary Oral thrush is an infection in your mouth and throat. It is caused by a fungus. You are more likely to get this condition if you have a weak body defense system. Diseases like diabetes, cancer, or HIV also add to your risk. This condition is treated with medicines called antifungals. Contact a doctor if you do not get better within 7 days of starting treatment. This information is not intended to replace advice given to you by your health care provider.  Make sure you discuss any questions you have with your health care provider. Document Revised: 10/21/2020 Document Reviewed: 12/28/2018 Elsevier Patient Education  [place].   Fatigue If you have fatigue, you feel tired all the time and have a lack of energy or a lack of motivation. Fatigue may make it difficult to start or complete tasks because of exhaustion. Occasional or mild fatigue is often a normal response to activity or life. However, long-term (chronic) or extreme fatigue may be a  symptom of a medical condition such as: Depression. Not having enough red blood cells or hemoglobin in the blood (anemia). A problem with a small gland located in the lower front part of the neck (thyroid disorder). Rheumatologic conditions. These are problems related to the body's defense system (immune system). Infections, especially certain viral infections. Fatigue can also lead to negative health outcomes over time. Follow these instructions at home: Medicines Take over-the-counter and prescription medicines only as told by your health care provider. Take a multivitamin if told by your health care provider. Do not use herbal or dietary supplements unless they are approved by your health care provider. Eating and drinking  Avoid heavy meals in the evening. Eat a well-balanced diet, which includes lean proteins, whole grains, plenty of fruits and vegetables, and low-fat dairy products. Avoid eating or drinking too many products with caffeine in them. Avoid alcohol. Drink enough fluid to keep your urine pale yellow. Activity  Exercise regularly, as told by your health care provider. Use or practice techniques to help you relax, such as yoga, tai chi, meditation, or massage therapy. Lifestyle Change situations that cause you stress. Try to keep your work and personal schedules in balance. Do not use recreational or illegal drugs. General instructions Monitor your fatigue for any changes. Go to bed and get up at the same time every day. Avoid fatigue by pacing yourself during the day and getting enough sleep at night. Maintain a healthy weight. Contact a health care provider if: Your fatigue does not get better. You have a fever. You suddenly lose or gain weight. You have headaches. You have trouble falling asleep or sleeping through the night. You feel angry, guilty, anxious, or sad. You have swelling in your legs or another part of your body. Get help right away if: You feel  confused, feel like you might faint, or faint. Your vision is blurry or you have a severe headache. You have severe pain in your abdomen, your back, or the area between your waist and hips (pelvis). You have chest pain, shortness of breath, or an irregular or fast heartbeat. You are unable to urinate, or you urinate less than normal. You have abnormal bleeding from the rectum, nose, lungs, nipples, or, if you are male, the vagina. You vomit blood. You have thoughts about hurting yourself or others. These symptoms may be an emergency. Get help right away. Call 911. Do not wait to see if the symptoms will go away. Do not drive yourself to the hospital. Get help right away if you feel like you may hurt yourself or others, or have thoughts about taking your own life. Go to your nearest emergency room or: Call 911. Call the [facility] at ([phone])790-2819 or 988. This is open 24 hours a day. Text the Crisis Text Line at (908) 287-2687. Summary If you have fatigue, you feel tired all the time and have a lack of energy or a lack of motivation. Fatigue may make it difficult to  start or complete tasks because of exhaustion. Long-term (chronic) or extreme fatigue may be a symptom of a medical condition. Exercise regularly, as told by your health care provider. Change situations that cause you stress. Try to keep your work and personal schedules in balance. This information is not intended to replace advice given to you by your health care provider. Make sure you discuss any questions you have with your health care provider. Document Revised: 12/14/2020 Document Reviewed: 12/14/2020 Elsevier Patient Education  [place].

## 2021-12-21 NOTE — Progress Notes (Signed)
New Patient Office Visit  Subjective    Patient ID: Jacob Collier, male    DOB: 01-Mar-1956  Age: 66 y.o. MRN: [id number]  CC:  Chief Complaint  Patient presents with   Fatigue    HPI Jacob Collier presents for fatigue. Recently had BP medication with cardiologist a few weeks ago, began HCTZ. He is concerned he has an electrolyte imbalance. He underwent recent epidural steroid injections and oral steroids after dental work. Pt has significant cardiac history including CAD with CABG x4 in 2021. He denies chest pain, dyspnea, or syncope. He was treated a week ago with a course of Azithromycin for sinusitis.    Outpatient Encounter Medications as of 12/21/2021  Medication Sig   acetaminophen (TYLENOL) 500 MG tablet Take 1,000 mg by mouth every 8 (eight) hours as needed for mild pain.   amLODipine (NORVASC) 5 MG tablet Take 1 tablet (5 mg total) by mouth at bedtime.   aspirin EC 81 MG tablet Take 81 mg by mouth daily. Swallow whole.   azithromycin (ZITHROMAX) 250 MG tablet Take 2 tablets on day 1, then 1 tablet daily on days 2 through 5   clonazePAM (KLONOPIN) 1 MG tablet TAKE 1 TABLET BY MOUTH TWICE A DAY AS NEEDED FOR ANXIETY   clopidogrel (PLAVIX) 75 MG tablet TAKE 1 TABLET (75 MG TOTAL) BY MOUTH DAILY. STOP TAKING PLAVIX ON SATURDAY NOV. 5, 2022   Eszopiclone 3 MG TABS TAKE 1 TABLET BY MOUTH EVERYDAY AT BEDTIME   Evolocumab (REPATHA SURECLICK) 614 MG/ML SOAJ Inject 140 mg into the skin every 14 (fourteen) days.   ezetimibe (ZETIA) 10 MG tablet TAKE 1 TABLET BY MOUTH EVERY DAY   fluticasone (FLONASE) 50 MCG/ACT nasal spray Place 2 sprays into both nostrils daily.   metoprolol succinate (TOPROL-XL) 25 MG 24 hr tablet Take 0.5 tablets (12.5 mg total) by mouth daily. Take with or immediately following a meal. (Patient taking differently: Take 12.5 mg by mouth 2 (two) times daily. Take with or immediately following a meal.)   nitroGLYCERIN (NITROSTAT) 0.4 MG SL tablet Place 1 tablet (0.4  mg total) under the tongue every 5 (five) minutes as needed for chest pain. (Patient not taking: Reported on 11/11/2021)   Omega-3 Fatty Acids (OMEGA 3 PO) Take 1 capsule by mouth 2 (two) times daily.   promethazine-dextromethorphan (PROMETHAZINE-DM) 6.25-15 MG/5ML syrup Take 5 mLs by mouth 4 (four) times daily as needed.   [DISCONTINUED] cyclobenzaprine (FLEXERIL) 10 MG tablet Take 1 tablet (10 mg total) by mouth 3 (three) times daily as needed for muscle spasms. (Patient not taking: Reported on 11/11/2021)   [DISCONTINUED] hydrochlorothiazide (HYDRODIURIL) 12.5 MG tablet Take 1 tablet (12.5 mg total) by mouth every morning.   [DISCONTINUED] naloxegol oxalate (MOVANTIK) 12.5 MG TABS tablet Take 1 tablet (12.5 mg total) by mouth daily. (Patient not taking: Reported on 11/11/2021)   [DISCONTINUED] SALONPAS PAIN RELIEVING 4 % Place 1 patch onto the skin daily as needed for pain. (Patient not taking: Reported on 11/11/2021)   No facility-administered encounter medications on file as of 12/21/2021.    Past Medical History:  Diagnosis Date   Anxiety    Arthritis    Coronary artery disease    s/p prior stenting to RCA. Last cath in 05/2019 showed in-stent CTO of RCA and CTO of LCX (medical therpay recommended)   Diverticulosis    Dyspnea    Hard of hearing    Hyperlipidemia    Hypertension    hx of elevation  on lyrica, off med and now normal    Paroxysmal SVT (supraventricular tachycardia)    Prostate cancer (HCC)    Wears glasses     Past Surgical History:  Procedure Laterality Date   ANTERIOR CERVICAL DECOMP/DISCECTOMY FUSION N/A 09/03/2021   Procedure: CERVICAL FIVE-SIX, CERVICAL SIX-SEVEN ANTERIOR CERVICAL DECOMPRESSION/DISCECTOMY FUSION;  Surgeon: Earnie Larsson, MD;  Location: [place];  Service: Neurosurgery;  Laterality: N/A;   CARDIAC CATHETERIZATION     with 2 stents    COLECTOMY  05/2018   COLONOSCOPY     CORONARY ARTERY BYPASS GRAFT N/A 01/19/2021   Procedure: CORONARY ARTERY BYPASS  GRAFTING (CABG), ON PUMP TIMES FOUR, USING LEFT INTERNAL MAMMARY ARTERY AND ENDOSOCPICALLY HARVESTED RIGHT GREATER SAPHENOUS VEIN;  Surgeon: Lajuana Matte, MD;  Location: [place];  Service: Open Heart Surgery;  Laterality: N/A;   CYSTOSCOPY WITH URETHRAL DILATATION N/A 01/14/2021   Procedure: CYSTOSCOPY WITH BALLOON  DILATATION;  Surgeon: Bjorn Loser, MD;  [facility];  Service: Urology;  Laterality: N/A;   EYE SURGERY  02/2020   bilaterl cataracts   IR THORACENTESIS ASP PLEURAL SPACE W/IMG GUIDE  02/02/2021   LEFT HEART CATH AND CORONARY ANGIOGRAPHY N/A 12/24/2020   Procedure: LEFT HEART CATH AND CORONARY ANGIOGRAPHY;  Surgeon: Martinique, Peter M, MD;  Location: [facility];  Service: Cardiovascular;  Laterality: N/A;   PROSTATECTOMY     TEE WITHOUT CARDIOVERSION N/A 01/19/2021   Procedure: TRANSESOPHAGEAL ECHOCARDIOGRAM (TEE);  Surgeon: Lajuana Matte, MD;  Location: [address];  Service: Open Heart Surgery;  Laterality: N/A;   TOTAL HIP ARTHROPLASTY Left 11/10/2015   Procedure: LEFT TOTAL HIP ARTHROPLASTY ANTERIOR APPROACH;  Surgeon: Mcarthur Rossetti, MD;  Location: [facility];  Service: Orthopedics;  Laterality: Left;    Family History  Problem Relation Age of Onset   Alzheimer's disease Mother    Stroke Father    Diabetes Sister    Heart attack Maternal Grandmother    Cancer Maternal Uncle        prostate   Prostate cancer Maternal Uncle    Cancer Maternal Uncle        prostate   Prostate cancer Maternal Uncle    Colon polyps Brother    Colon cancer Neg Hx    Esophageal cancer Neg Hx    Rectal cancer Neg Hx    Stomach cancer Neg Hx     Social History   Socioeconomic History   Marital status: Married    Spouse name: Not on file   Number of children: 0   Years of education: Not on file   Highest education level: Not on file  Occupational History   Occupation: Employed at [employer]  Tobacco Use   Smoking status: Never   Smokeless tobacco: Never   Vaping Use   Vaping Use: Never used  Substance and Sexual Activity   Alcohol use: Not Currently    Alcohol/week: 0.0 standard drinks of alcohol    Comment: rarely   Drug use: No   Sexual activity: Yes    Partners: Female    Birth control/protection: None  Other Topics Concern   Not on file  Social History Narrative   Not on file   Social Determinants of Health   Financial Resource Strain: Not on file  Food Insecurity: Not on file  Transportation Needs: Not on file  Physical Activity: Not on file  Stress: Not on file  Social Connections: Not on file  Intimate Partner Violence: Not on file  ROS   See pertinent positives and negatives per HPI.    Objective    BP 136/78 (BP Location: Left Arm)   Pulse 75   Temp (!) 97.4 F (36.3 C)   Ht '5\' 11"'$  (1.803 m)   Wt 224 lb (101.6 kg)   SpO2 98%   BMI 31.24 kg/m    Physical Exam Vitals reviewed.  Constitutional:      Appearance: He is obese.  HENT:     Right Ear: Tympanic membrane normal.     Left Ear: Tympanic membrane normal.     Mouth/Throat:     Tongue: Lesions present.     Comments: White patches to tongue noted Cardiovascular:     Rate and Rhythm: Normal rate. Rhythm irregular.     Pulses: Normal pulses.     Heart sounds: Normal heart sounds.  Pulmonary:     Effort: Pulmonary effort is normal.     Breath sounds: Normal breath sounds.  Abdominal:     General: Bowel sounds are normal.     Palpations: Abdomen is soft.  Skin:    General: Skin is warm and dry.     Capillary Refill: Capillary refill takes less than 2 seconds.  Neurological:     General: No focal deficit present.     Mental Status: He is alert and oriented to person, place, and time.  Psychiatric:        Mood and Affect: Mood normal.        Behavior: Behavior normal.         Assessment & Plan:    1. Fatigue, unspecified type - Comprehensive metabolic panel  2. Thrush - fluconazole (DIFLUCAN) 150 MG tablet; Take 1 tablet (150  mg total) by mouth once for 1 dose.  Dispense: 1 tablet; Refill: 0   Take Diflucan 150 mg by mouth one time for oral yeast infection We will call you with lab results Follow-up with Dr Henrene Pastor on 12/29/21 at 2:00 Contact Cardiology concerning cardiac medications   Follow-up: 12/29/21 at 2:00 pm with Dr Rene Paci, Rip Harbour, NP, have reviewed all documentation for this visit. The documentation on 12/21/21 for the exam, diagnosis, procedures, and orders are all accurate and complete.   Signed, Rip Harbour, NP

## 2021-12-22 LAB — COMPREHENSIVE METABOLIC PANEL
ALT: 30 IU/L (ref 0–44)
AST: 25 IU/L (ref 0–40)
Albumin/Globulin Ratio: 1.9 (ref 1.2–2.2)
Albumin: 4.5 g/dL (ref 3.9–4.9)
Alkaline Phosphatase: 72 IU/L (ref 44–121)
BUN/Creatinine Ratio: 18 (ref 10–24)
BUN: 23 mg/dL (ref 8–27)
Bilirubin Total: 0.9 mg/dL (ref 0.0–1.2)
CO2: 22 mmol/L (ref 20–29)
Calcium: 9.6 mg/dL (ref 8.6–10.2)
Chloride: 103 mmol/L (ref 96–106)
Creatinine, Ser: 1.29 mg/dL — ABNORMAL HIGH (ref 0.76–1.27)
Globulin, Total: 2.4 g/dL (ref 1.5–4.5)
Glucose: 75 mg/dL (ref 70–99)
Potassium: 4.3 mmol/L (ref 3.5–5.2)
Sodium: 140 mmol/L (ref 134–144)
Total Protein: 6.9 g/dL (ref 6.0–8.5)
eGFR: 61 mL/min/{1.73_m2} (ref >59)

## 2021-12-29 ENCOUNTER — Encounter: Payer: Self-pay | Admitting: Legal Medicine

## 2021-12-29 ENCOUNTER — Ambulatory Visit: Payer: Managed Care, Other (non HMO) | Admitting: Legal Medicine

## 2021-12-29 VITALS — BP 140/80 | HR 69 | Temp 98.6°F | Resp 14 | Ht 71.0 in | Wt 221.0 lb

## 2021-12-29 DIAGNOSIS — I471 Supraventricular tachycardia, unspecified: Secondary | ICD-10-CM | POA: Diagnosis not present

## 2021-12-29 MED ORDER — DILTIAZEM HCL ER 60 MG PO CP12: 60.0000 mg | ORAL_CAPSULE | Freq: Every day | ORAL | 3 refills | Status: DC

## 2021-12-29 NOTE — Progress Notes (Signed)
Subjective:  Patient ID: Jacob Collier, male    DOB: 16-Oct-1955  Age: 66 y.o. MRN: [id number]  Chief Complaint  Patient presents with   Tachycardia    HPI  Patient had 3 spells of tachycardia to day and 3 spells yesterday. He states were for 5-10 minutes and pulse was 140's irregular heart beats.  CORONARY ARTERY DISEASE  Patient presents in follow up of CAD. Patient was diagnosed in 2020. The patient has no associated CHF. The patient is currently taking a beta blocker, statin, and aspirin. CAD was diagnosed 3 years ago.  Patient is having no angina. Patient has used no NTG.  Patient is followed by cardiology.  Patient had stent and cabg . Last angiography was 2022, last echocardiogram 2022.   Current Outpatient Medications on File Prior to Visit  Medication Sig Dispense Refill   acetaminophen (TYLENOL) 500 MG tablet Take 1,000 mg by mouth every 8 (eight) hours as needed for mild pain.     amLODipine (NORVASC) 5 MG tablet Take 1 tablet (5 mg total) by mouth at bedtime. (Patient taking differently: Take 2.5 mg by mouth in the morning and at bedtime.) 90 tablet 3   aspirin EC 81 MG tablet Take 81 mg by mouth daily. Swallow whole.     chlorhexidine (PERIDEX) 0.12 % solution 15 mLs 2 (two) times daily.     clonazePAM (KLONOPIN) 1 MG tablet TAKE 1 TABLET BY MOUTH TWICE A DAY AS NEEDED FOR ANXIETY 60 tablet 3   clopidogrel (PLAVIX) 75 MG tablet TAKE 1 TABLET (75 MG TOTAL) BY MOUTH DAILY. STOP TAKING PLAVIX ON SATURDAY NOV. 5, 2022 90 tablet 3   Eszopiclone 3 MG TABS TAKE 1 TABLET BY MOUTH EVERYDAY AT BEDTIME 30 tablet 2   Evolocumab (REPATHA SURECLICK) 664 MG/ML SOAJ Inject 140 mg into the skin every 14 (fourteen) days. 2 mL 11   ezetimibe (ZETIA) 10 MG tablet TAKE 1 TABLET BY MOUTH EVERY DAY 90 tablet 3   fluticasone (FLONASE) 50 MCG/ACT nasal spray Place 2 sprays into both nostrils daily. 16 g 6   nitroGLYCERIN (NITROSTAT) 0.4 MG SL tablet Place 1 tablet (0.4 mg total) under the tongue  every 5 (five) minutes as needed for chest pain. 25 tablet 2   Omega-3 Fatty Acids (OMEGA 3 PO) Take 1 capsule by mouth 2 (two) times daily.     promethazine-dextromethorphan (PROMETHAZINE-DM) 6.25-15 MG/5ML syrup Take 5 mLs by mouth 4 (four) times daily as needed. 118 mL 0   metoprolol succinate (TOPROL-XL) 25 MG 24 hr tablet Take 0.5 tablets (12.5 mg total) by mouth daily. Take with or immediately following a meal. (Patient taking differently: Take 12.5 mg by mouth 2 (two) times daily. Take with or immediately following a meal.) 45 tablet 2   No current facility-administered medications on file prior to visit.   Past Medical History:  Diagnosis Date   Anxiety    Arthritis    Coronary artery disease    s/p prior stenting to RCA. Last cath in 05/2019 showed in-stent CTO of RCA and CTO of LCX (medical therpay recommended)   Diverticulosis    Dyspnea    Hard of hearing    Hyperlipidemia    Hypertension    hx of elevation on lyrica, off med and now normal    Paroxysmal SVT (supraventricular tachycardia)    Prostate cancer ([place])    Wears glasses    Past Surgical History:  Procedure Laterality Date   ANTERIOR CERVICAL DECOMP/DISCECTOMY FUSION N/A  09/03/2021   Procedure: CERVICAL FIVE-SIX, CERVICAL SIX-SEVEN ANTERIOR CERVICAL DECOMPRESSION/DISCECTOMY FUSION;  Surgeon: Earnie Larsson, MD;  Location: [place];  Service: Neurosurgery;  Laterality: N/A;   CARDIAC CATHETERIZATION     with 2 stents    COLECTOMY  05/2018   COLONOSCOPY     CORONARY ARTERY BYPASS GRAFT N/A 01/19/2021   Procedure: CORONARY ARTERY BYPASS GRAFTING (CABG), ON PUMP TIMES FOUR, USING LEFT INTERNAL MAMMARY ARTERY AND ENDOSOCPICALLY HARVESTED RIGHT GREATER SAPHENOUS VEIN;  Surgeon: Lajuana Matte, MD;  Location: [place];  Service: Open Heart Surgery;  Laterality: N/A;   CYSTOSCOPY WITH URETHRAL DILATATION N/A 01/14/2021   Procedure: CYSTOSCOPY WITH BALLOON  DILATATION;  Surgeon: Bjorn Loser, MD;  [facility];   Service: Urology;  Laterality: N/A;   EYE SURGERY  02/2020   bilaterl cataracts   IR THORACENTESIS ASP PLEURAL SPACE W/IMG GUIDE  02/02/2021   LEFT HEART CATH AND CORONARY ANGIOGRAPHY N/A 12/24/2020   Procedure: LEFT HEART CATH AND CORONARY ANGIOGRAPHY;  Surgeon: Martinique, Peter M, MD;  Location: [facility];  Service: Cardiovascular;  Laterality: N/A;   PROSTATECTOMY     TEE WITHOUT CARDIOVERSION N/A 01/19/2021   Procedure: TRANSESOPHAGEAL ECHOCARDIOGRAM (TEE);  Surgeon: Lajuana Matte, MD;  Location: [place];  Service: Open Heart Surgery;  Laterality: N/A;   TOTAL HIP ARTHROPLASTY Left 11/10/2015   Procedure: LEFT TOTAL HIP ARTHROPLASTY ANTERIOR APPROACH;  Surgeon: Mcarthur Rossetti, MD;  Location: [facility];  Service: Orthopedics;  Laterality: Left;    Family History  Problem Relation Age of Onset   Alzheimer's disease Mother    Stroke Father    Diabetes Sister    Heart attack Maternal Grandmother    Cancer Maternal Uncle        prostate   Prostate cancer Maternal Uncle    Cancer Maternal Uncle        prostate   Prostate cancer Maternal Uncle    Colon polyps Brother    Colon cancer Neg Hx    Esophageal cancer Neg Hx    Rectal cancer Neg Hx    Stomach cancer Neg Hx    Social History   Socioeconomic History   Marital status: Married    Spouse name: Not on file   Number of children: 0   Years of education: Not on file   Highest education level: Not on file  Occupational History   Occupation: Employed at [employer]  Tobacco Use   Smoking status: Never   Smokeless tobacco: Never  Vaping Use   Vaping Use: Never used  Substance and Sexual Activity   Alcohol use: Not Currently    Alcohol/week: 0.0 standard drinks of alcohol    Comment: rarely   Drug use: No   Sexual activity: Yes    Partners: Female    Birth control/protection: None  Other Topics Concern   Not on file  Social History Narrative   Not on file   Social Determinants of Health   Financial  Resource Strain: Not on file  Food Insecurity: Not on file  Transportation Needs: Not on file  Physical Activity: Not on file  Stress: Not on file  Social Connections: Not on file    Review of Systems  Constitutional:  Negative for chills, fatigue, fever and unexpected weight change.  HENT:  Negative for congestion, ear pain, sinus pain and sore throat.   Respiratory:  Negative for cough and shortness of breath.   Cardiovascular:  Positive for palpitations. Negative for chest  pain.  Gastrointestinal:  Negative for abdominal pain, blood in stool, constipation, diarrhea, nausea and vomiting.  Endocrine: Negative for polydipsia.  Genitourinary:  Negative for dysuria.  Musculoskeletal:  Negative for back pain.  Skin:  Negative for rash.  Neurological:  Negative for headaches.  EKG: : NSR with PACs, possible LAE, LVH, old DMI, not changed from last EKG, PR 174 msec, Qtc 450mec, Qrs axis 50 degrees   Objective:  BP (!) 140/80   Pulse 69   Temp 98.6 F (37 C)   Resp 14   Ht '5\' 11"'$  (1.803 m)   Wt 221 lb (100.2 kg)   SpO2 95%   BMI 30.82 kg/m      12/29/2021    2:25 PM 12/21/2021    3:45 PM 11/11/2021    4:22 PM  BP/Weight  Systolic BP [id number] Diastolic BP 80 78 82  Wt. (Lbs) 221 224   BMI 30.82 kg/m2 31.24 kg/m2     Physical Exam Vitals reviewed.  Constitutional:      General: He is not in acute distress.    Appearance: Normal appearance.  HENT:     Head: Normocephalic.     Right Ear: Tympanic membrane normal.     Left Ear: Tympanic membrane normal.     Nose: Nose normal.     Mouth/Throat:     Mouth: Mucous membranes are moist.     Pharynx: Oropharynx is clear.  Eyes:     Extraocular Movements: Extraocular movements intact.     Conjunctiva/sclera: Conjunctivae normal.     Pupils: Pupils are equal, round, and reactive to light.  Cardiovascular:     Rate and Rhythm: Normal rate and regular rhythm.     Pulses: Normal pulses.     Heart sounds: Normal heart  sounds. No murmur heard.    No gallop.  Pulmonary:     Effort: Pulmonary effort is normal. No respiratory distress.     Breath sounds: Normal breath sounds. No wheezing.  Abdominal:     General: Abdomen is flat. Bowel sounds are normal. There is no distension.     Palpations: Abdomen is soft.     Tenderness: There is no abdominal tenderness.  Musculoskeletal:        General: Normal range of motion.     Cervical back: Normal range of motion.     Right lower leg: No edema.     Left lower leg: No edema.  Skin:    General: Skin is warm and dry.     Capillary Refill: Capillary refill takes less than 2 seconds.  Neurological:     General: No focal deficit present.     Mental Status: He is alert and oriented to person, place, and time. Mental status is at baseline.  Psychiatric:        Mood and Affect: Mood normal.        Thought Content: Thought content normal.         Lab Results  Component Value Date   WBC 8.0 11/11/2021   HGB 16.4 11/11/2021   HCT 48.6 11/11/2021   PLT 212 11/11/2021   GLUCOSE 75 12/29/2021   CHOL 75 (L) 07/05/2021   TRIG 94 07/05/2021   HDL 36 (L) 07/05/2021   LDLCALC 20 07/05/2021   ALT 31 12/29/2021   AST 28 12/29/2021   NA 140 12/29/2021   K 4.3 12/29/2021   CL 104 12/29/2021   CREATININE 1.25 12/29/2021   BUN 19 12/29/2021  CO2 19 (L) 12/29/2021   TSH 2.450 07/05/2021   INR 1.4 (H) 01/19/2021   HGBA1C 5.8 (H) 03/04/2021      Assessment & Plan:   Problem List Items Addressed This Visit       Cardiovascular and Mediastinum   Paroxysmal supraventricular tachycardia - Primary   Relevant Medications   diltiazem (CARDIZEM SR) 60 MG 12 hr capsule   Other Relevant Orders   Comprehensive metabolic panel (Completed)   EKG 12-Lead (Completed)PSVT Patient having recurrent  dysthymia, from viral syndrome vs anxiety  .  Meds ordered this encounter  Medications   diltiazem (CARDIZEM SR) 60 MG 12 hr capsule    Sig: Take 1 capsule (60 mg  total) by mouth daily.    Dispense:  30 capsule    Refill:  3    Orders Placed This Encounter  Procedures   Comprehensive metabolic panel   EKG [id number]     Follow-up: Return in about 1 month (around 01/29/2022).  An After Visit Summary was printed and given to the patient.  Reinaldo Meeker, MD [facility] [phone]

## 2021-12-30 LAB — COMPREHENSIVE METABOLIC PANEL
ALT: 31 IU/L (ref 0–44)
AST: 28 IU/L (ref 0–40)
Albumin/Globulin Ratio: 1.8 (ref 1.2–2.2)
Albumin: 4.6 g/dL (ref 3.9–4.9)
Alkaline Phosphatase: 71 IU/L (ref 44–121)
BUN/Creatinine Ratio: 15 (ref 10–24)
BUN: 19 mg/dL (ref 8–27)
Bilirubin Total: 0.7 mg/dL (ref 0.0–1.2)
CO2: 19 mmol/L — ABNORMAL LOW (ref 20–29)
Calcium: 9.3 mg/dL (ref 8.6–10.2)
Chloride: 104 mmol/L (ref 96–106)
Creatinine, Ser: 1.25 mg/dL (ref 0.76–1.27)
Globulin, Total: 2.6 g/dL (ref 1.5–4.5)
Glucose: 75 mg/dL (ref 70–99)
Potassium: 4.3 mmol/L (ref 3.5–5.2)
Sodium: 140 mmol/L (ref 134–144)
Total Protein: 7.2 g/dL (ref 6.0–8.5)
eGFR: 63 mL/min/{1.73_m2} (ref >59)

## 2021-12-30 NOTE — Progress Notes (Signed)
Kidney and liver tests normal,  lp

## 2022-01-05 ENCOUNTER — Ambulatory Visit: Payer: Managed Care, Other (non HMO)

## 2022-01-19 DIAGNOSIS — R9721 Rising PSA following treatment for malignant neoplasm of prostate: Secondary | ICD-10-CM | POA: Diagnosis not present

## 2022-01-25 ENCOUNTER — Telehealth: Payer: Self-pay

## 2022-01-25 ENCOUNTER — Telehealth: Payer: Self-pay | Admitting: Urgent Care

## 2022-01-25 DIAGNOSIS — J069 Acute upper respiratory infection, unspecified: Secondary | ICD-10-CM

## 2022-01-25 NOTE — Progress Notes (Signed)
Because of your high fever and feeling very terrible and weak, I feel your condition warrants further evaluation and I recommend that you be seen in a face to face visit. Additional testing such as flu testing or a chest xray may be warranted.    NOTE: There will be NO CHARGE for this eVisit   If you are having a true medical emergency please call 911.      For an urgent face to face visit, [facility] has seven urgent care centers for your convenience:     [facility] [place] [facility] [place] Get Driving Directions [phone] [address] [place], [place] [address]    [facility]) Get Driving Directions [phone] [place], [place] [address]  [facility] [place] [facility]) Get Driving Directions [phone] [address] [place],  [place]  [address]  [facility] [address] Get Driving Directions  [phone] [address] [place],  [place] [address]   [facility] Get Driving Directions [phone] [place], [place], [place] [address]   [facility] Get Driving Directions  [phone] [address] [place], [place] [address]   [facility] at  Get Driving Directions [phone] [address], [place], [place] [address]  Your MyChart E-visit questionnaire answers were reviewed by a board certified advanced clinical practitioner to complete your personal care plan based on your specific symptoms.  Thank you for using e-Visits.

## 2022-01-25 NOTE — Telephone Encounter (Signed)
Jacob Collier called to report that his fever is 101.  He was instructed to log in to MyChart for a video visit this afternoon if possible.  Patient agreed to do so.

## 2022-01-26 ENCOUNTER — Telehealth (INDEPENDENT_AMBULATORY_CARE_PROVIDER_SITE_OTHER): Payer: Managed Care, Other (non HMO) | Admitting: Legal Medicine

## 2022-01-26 ENCOUNTER — Other Ambulatory Visit: Payer: Self-pay | Admitting: Student

## 2022-01-26 ENCOUNTER — Encounter: Payer: Self-pay | Admitting: Legal Medicine

## 2022-01-26 ENCOUNTER — Other Ambulatory Visit: Payer: Self-pay | Admitting: Legal Medicine

## 2022-01-26 VITALS — BP 137/82 | HR 72 | Temp 99.5°F | Ht 71.0 in | Wt 221.0 lb

## 2022-01-26 DIAGNOSIS — I471 Supraventricular tachycardia, unspecified: Secondary | ICD-10-CM

## 2022-01-26 DIAGNOSIS — J209 Acute bronchitis, unspecified: Secondary | ICD-10-CM

## 2022-01-26 DIAGNOSIS — J208 Acute bronchitis due to other specified organisms: Secondary | ICD-10-CM | POA: Insufficient documentation

## 2022-01-26 MED ORDER — DOXYCYCLINE HYCLATE 100 MG PO TABS: 100.0000 mg | ORAL_TABLET | Freq: Two times a day (BID) | ORAL | 0 refills | Status: DC

## 2022-01-26 NOTE — Progress Notes (Deleted)
.  cfp

## 2022-01-26 NOTE — Progress Notes (Signed)
Virtual Visit via Video Note   This visit type was conducted due to national recommendations for restrictions regarding the COVID-19 Pandemic (e.g. social distancing) in an effort to limit this patient's exposure and mitigate transmission in our community.  Due to his co-morbid illnesses, this patient is at least at moderate risk for complications without adequate follow up.  This format is felt to be most appropriate for this patient at this time.  All issues noted in this document were discussed and addressed.  A limited physical exam was performed with this format.  A verbal consent was obtained for the virtual visit.   Date:  01/30/2022   ID:  Jacob Collier, DOB 1956/01/17, MRN [id number]  Patient Location: Home Provider Location: Office/Clinic  PCP:  [name], MD   Evaluation Performed:  New Patient Evaluation  Chief Complaint:  Severe Cough  History of Present Illness:    Jacob Collier is a 66 y.o. male with cough, chest congestion, fever, chills, fatigue since one day ago. He did rapid covid test at home and it was negative. Cough is nonproductive.  He is in bed.  The patient does not have symptoms concerning for COVID-19 infection (fever, chills, cough, or new shortness of breath).    Past Medical History:  Diagnosis Date   Anxiety    Arthritis    Coronary artery disease    s/p prior stenting to RCA. Last cath in 05/2019 showed in-stent CTO of RCA and CTO of LCX (medical therpay recommended)   Diverticulosis    Dyspnea    Hard of hearing    Hyperlipidemia    Hypertension    hx of elevation on lyrica, off med and now normal    Paroxysmal SVT (supraventricular tachycardia)    Prostate cancer (HCC)    Wears glasses     Past Surgical History:  Procedure Laterality Date   ANTERIOR CERVICAL DECOMP/DISCECTOMY FUSION N/A 09/03/2021   Procedure: CERVICAL FIVE-SIX, CERVICAL SIX-SEVEN ANTERIOR CERVICAL DECOMPRESSION/DISCECTOMY FUSION;  Surgeon: Earnie Larsson,  MD;  Location: [facility];  Service: Neurosurgery;  Laterality: N/A;   CARDIAC CATHETERIZATION     with 2 stents    COLECTOMY  05/2018   COLONOSCOPY     CORONARY ARTERY BYPASS GRAFT N/A 01/19/2021   Procedure: CORONARY ARTERY BYPASS GRAFTING (CABG), ON PUMP TIMES FOUR, USING LEFT INTERNAL MAMMARY ARTERY AND ENDOSOCPICALLY HARVESTED RIGHT GREATER SAPHENOUS VEIN;  Surgeon: Lajuana Matte, MD;  Location: [facility];  Service: Open Heart Surgery;  Laterality: N/A;   CYSTOSCOPY WITH URETHRAL DILATATION N/A 01/14/2021   Procedure: CYSTOSCOPY WITH BALLOON  DILATATION;  Surgeon: Bjorn Loser, MD;  Location: [facility];  Service: Urology;  Laterality: N/A;   EYE SURGERY  02/2020   bilaterl cataracts   IR THORACENTESIS ASP PLEURAL SPACE W/IMG GUIDE  02/02/2021   LEFT HEART CATH AND CORONARY ANGIOGRAPHY N/A 12/24/2020   Procedure: LEFT HEART CATH AND CORONARY ANGIOGRAPHY;  Surgeon: Martinique, Peter M, MD;  Location: [facility];  Service: Cardiovascular;  Laterality: N/A;   PROSTATECTOMY     TEE WITHOUT CARDIOVERSION N/A 01/19/2021   Procedure: TRANSESOPHAGEAL ECHOCARDIOGRAM (TEE);  Surgeon: Lajuana Matte, MD;  Location: [facility];  Service: Open Heart Surgery;  Laterality: N/A;   TOTAL HIP ARTHROPLASTY Left 11/10/2015   Procedure: LEFT TOTAL HIP ARTHROPLASTY ANTERIOR APPROACH;  Surgeon: Mcarthur Rossetti, MD;  Location: [facility];  Service: Orthopedics;  Laterality: Left;    Family History  Problem Relation Age of Onset  Alzheimer's disease Mother    Stroke Father    Diabetes Sister    Heart attack Maternal Grandmother    Cancer Maternal Uncle        prostate   Prostate cancer Maternal Uncle    Cancer Maternal Uncle        prostate   Prostate cancer Maternal Uncle    Colon polyps Brother    Colon cancer Neg Hx    Esophageal cancer Neg Hx    Rectal cancer Neg Hx    Stomach cancer Neg Hx     Social History   Socioeconomic History   Marital status: Married    Spouse name: Not on  file   Number of children: 0   Years of education: Not on file   Highest education level: Not on file  Occupational History   Occupation: Employed at [employer]  Tobacco Use   Smoking status: Never   Smokeless tobacco: Never  Vaping Use   Vaping Use: Never used  Substance and Sexual Activity   Alcohol use: Not Currently    Alcohol/week: 0.0 standard drinks of alcohol    Comment: rarely   Drug use: No   Sexual activity: Yes    Partners: Female    Birth control/protection: None  Other Topics Concern   Not on file  Social History Narrative   Not on file   Social Determinants of Health   Financial Resource Strain: Not on file  Food Insecurity: Not on file  Transportation Needs: Not on file  Physical Activity: Not on file  Stress: Not on file  Social Connections: Not on file  Intimate Partner Violence: Not on file    Outpatient Medications Prior to Visit  Medication Sig Dispense Refill   acetaminophen (TYLENOL) 500 MG tablet Take 1,000 mg by mouth every 8 (eight) hours as needed for mild pain.     amLODipine (NORVASC) 5 MG tablet Take 1 tablet (5 mg total) by mouth at bedtime. (Patient taking differently: Take 2.5 mg by mouth in the morning and at bedtime.) 90 tablet 3   aspirin EC 81 MG tablet Take 81 mg by mouth daily. Swallow whole.     chlorhexidine (PERIDEX) 0.12 % solution 15 mLs 2 (two) times daily.     clonazePAM (KLONOPIN) 1 MG tablet TAKE 1 TABLET BY MOUTH TWICE A DAY AS NEEDED FOR ANXIETY 60 tablet 3   clopidogrel (PLAVIX) 75 MG tablet TAKE 1 TABLET (75 MG TOTAL) BY MOUTH DAILY. STOP TAKING PLAVIX ON SATURDAY NOV. 5, 2022 90 tablet 3   Eszopiclone 3 MG TABS TAKE 1 TABLET BY MOUTH EVERYDAY AT BEDTIME 30 tablet 2   Evolocumab (REPATHA SURECLICK) 409 MG/ML SOAJ Inject 140 mg into the skin every 14 (fourteen) days. 2 mL 11   ezetimibe (ZETIA) 10 MG tablet TAKE 1 TABLET BY MOUTH EVERY DAY 90 tablet 3   fluticasone (FLONASE) 50 MCG/ACT nasal spray Place 2 sprays into  both nostrils daily. 16 g 6   nitroGLYCERIN (NITROSTAT) 0.4 MG SL tablet Place 1 tablet (0.4 mg total) under the tongue every 5 (five) minutes as needed for chest pain. 25 tablet 2   Omega-3 Fatty Acids (OMEGA 3 PO) Take 1 capsule by mouth 2 (two) times daily.     promethazine-dextromethorphan (PROMETHAZINE-DM) 6.25-15 MG/5ML syrup Take 5 mLs by mouth 4 (four) times daily as needed. 118 mL 0   diltiazem (CARDIZEM SR) 60 MG 12 hr capsule Take 1 capsule (60 mg total) by mouth daily. [place]  capsule 3   metoprolol succinate (TOPROL-XL) 25 MG 24 hr tablet Take 0.5 tablets (12.5 mg total) by mouth daily. Take with or immediately following a meal. (Patient taking differently: Take 12.5 mg by mouth 2 (two) times daily. Take with or immediately following a meal.) 45 tablet 2   No facility-administered medications prior to visit.    Allergies:   Ranolazine, Atorvastatin, Pravastatin, Rosuvastatin, and Statins   Social History   Tobacco Use   Smoking status: Never   Smokeless tobacco: Never  Vaping Use   Vaping Use: Never used  Substance Use Topics   Alcohol use: Not Currently    Alcohol/week: 0.0 standard drinks of alcohol    Comment: rarely   Drug use: No     Review of Systems  Constitutional:  Negative for chills and fever.  HENT:  Positive for congestion.   Eyes: Negative.   Respiratory:  Positive for cough. Negative for hemoptysis, sputum production and shortness of breath.   Cardiovascular: Negative.   Gastrointestinal: Negative.   Genitourinary: Negative.   Musculoskeletal: Negative.   Skin:  Negative for itching and rash.  Neurological: Negative.   Psychiatric/Behavioral: Negative.       Labs/Other Tests and Data Reviewed:    Recent Labs: 07/05/2021: TSH 2.450 11/11/2021: Hemoglobin 16.4; Platelets 212 12/29/2021: ALT 31; BUN 19; Creatinine, Ser 1.25; Potassium 4.3; Sodium 140   Recent Lipid Panel Lab Results  Component Value Date/Time   CHOL 75 (L) 07/05/2021 08:55 AM   TRIG  94 07/05/2021 08:55 AM   HDL 36 (L) 07/05/2021 08:55 AM   CHOLHDL 2.1 07/05/2021 08:55 AM   CHOLHDL 4.8 06/06/2019 06:12 AM   LDLCALC 20 07/05/2021 08:55 AM    Wt Readings from Last 3 Encounters:  01/26/22 221 lb (100.2 kg)  12/29/21 221 lb (100.2 kg)  12/21/21 224 lb (101.6 kg)     Objective:    Vital Signs:  BP 137/82   Pulse 72   Temp 99.5 F (37.5 C)   Ht '5\' 11"'$  (1.803 m)   Wt 221 lb (100.2 kg)   SpO2 95%   BMI 30.82 kg/m    Physical Exam , sick in bed  ASSESSMENT & PLAN:    Problem List Items Addressed This Visit       Respiratory   Acute bronchitis - Primary   Relevant Medications   doxycycline (VIBRA-TABS) 100 MG tablet Sent in doxycycline, follow up PRN  .     COVID-19 Education: The signs and symptoms of COVID-19 were discussed with the patient and how to seek care for testing (follow up with PCP or arrange E-visit). The importance of social distancing was discussed today.   I spe20 time  minutes dedicated to the care of this patient on the date of this encounter to include face-to-face time with the patient, as well as:   Follow Up:  In Person prn  Signed, Reinaldo Meeker, MD  01/30/2022 5:08 PM    [place]

## 2022-01-28 ENCOUNTER — Other Ambulatory Visit: Payer: Self-pay | Admitting: Cardiovascular Disease

## 2022-01-28 ENCOUNTER — Other Ambulatory Visit: Payer: Self-pay | Admitting: Physician Assistant

## 2022-01-30 ENCOUNTER — Other Ambulatory Visit: Payer: Self-pay | Admitting: Legal Medicine

## 2022-01-30 DIAGNOSIS — I25110 Atherosclerotic heart disease of native coronary artery with unstable angina pectoris: Secondary | ICD-10-CM

## 2022-02-07 ENCOUNTER — Ambulatory Visit: Payer: Managed Care, Other (non HMO) | Attending: Cardiovascular Disease | Admitting: Cardiovascular Disease

## 2022-02-07 ENCOUNTER — Encounter: Payer: Self-pay | Admitting: Cardiovascular Disease

## 2022-02-07 VITALS — BP 120/62 | HR 56 | Ht 71.0 in | Wt 228.0 lb

## 2022-02-07 DIAGNOSIS — D751 Secondary polycythemia: Secondary | ICD-10-CM

## 2022-02-07 DIAGNOSIS — E785 Hyperlipidemia, unspecified: Secondary | ICD-10-CM

## 2022-02-07 DIAGNOSIS — Z951 Presence of aortocoronary bypass graft: Secondary | ICD-10-CM | POA: Diagnosis not present

## 2022-02-07 DIAGNOSIS — T466X5D Adverse effect of antihyperlipidemic and antiarteriosclerotic drugs, subsequent encounter: Secondary | ICD-10-CM

## 2022-02-07 DIAGNOSIS — T466X5A Adverse effect of antihyperlipidemic and antiarteriosclerotic drugs, initial encounter: Secondary | ICD-10-CM

## 2022-02-07 DIAGNOSIS — I2510 Atherosclerotic heart disease of native coronary artery without angina pectoris: Secondary | ICD-10-CM

## 2022-02-07 DIAGNOSIS — M791 Myalgia, unspecified site: Secondary | ICD-10-CM

## 2022-02-07 DIAGNOSIS — I10 Essential (primary) hypertension: Secondary | ICD-10-CM

## 2022-02-07 DIAGNOSIS — I480 Paroxysmal atrial fibrillation: Secondary | ICD-10-CM

## 2022-02-07 NOTE — Progress Notes (Signed)
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 progressive CAD he underwent successful CABG revascularization in January 19 2021 by Dr. [name] with a LIMA to LAD, SVG to PDA, SVG to diagonal and SVG to OM vessel.  He developed postoperative atrial fibrillation for which he was treated with amiodarone at 200 mg mg twice a day for 1 week and subsequently 200 mg daily.  He had undergone successful thoracentesis with removal of 2 L of pleural fluid following his CABG revascularization.  His last visit, I reccommended a schedule for ultimate wean and discontinuance of amiodarone.  He has been maintaining sinus rhythm off amiodarone.  Presently, his blood pressure is stable at 130/76 on metoprolol succinate 12.5 mg daily in addition to amlodipine which he takes at 5 mg in the morning and 2.5 mg at night.  I discussed along half-life of this drug and that he can take 75 mg in the morning but he prefers to split regimen.  He underwent successful cervical C4-5 surgery by Dr. Trenton Gammon and tolerated this well from a cardiovascular standpoint.  His ECG  today remains stable and shows sinus bradycardia 56 bpm ectopy.  He is now tolerating Repatha and Zetia.  I will continue him on DAPT.  As long as he remains stable I will see him in 6 months for reevaluation or sooner as needed.   Medication Adjustments/Labs and Tests Ordered: Current medicines are reviewed at length with the patient today.  Concerns regarding medicines are outlined above.  Medication changes, Labs and Tests ordered today are listed in the Patient Instructions below. Patient Instructions  Medication Instructions:   Your physician recommends that you continue on your current medications as directed. Please refer to the  Current Medication list given to you today.  *If you need a refill on your cardiac medications before your next appointment, please call your pharmacy*  Lab Work: NONE ordered at this time of appointment   If you have labs (blood work) drawn today and your tests are completely normal, you will receive your results only by: [facility] (if you have MyChart) OR A paper copy in the mail If you have any lab test that is abnormal or we need to change your treatment, we will call you to review the results.  Testing/Procedures: NONE ordered at this time of appointment   Follow-Up: At [facility], you and your health needs are our priority.  As part of our continuing mission to provide you with exceptional heart care, we have created designated Provider Care Teams.  These Care Teams include your primary Cardiologist (physician) and Advanced Practice Providers (APPs -  Physician Assistants and Nurse Practitioners) who all work together to provide you with the care you need, when you need it.   Your next appointment:   6 month(s)  The format for your next appointment:   In Person  Provider:   [name], MD     Other Instructions  Important Information About Sugar         Signed, [name], MD  02/14/2022 6:35 PM    [facility] [address], [place], [place], [place]  [address] Phone: [phone]  8.I did okay

## 2022-02-07 NOTE — Patient Instructions (Addendum)
Medication Instructions:   Your physician recommends that you continue on your current medications as directed. Please refer to the Current Medication list given to you today.  *If you need a refill on your cardiac medications before your next appointment, please call your pharmacy*  Lab Work: NONE ordered at this time of appointment   If you have labs (blood work) drawn today and your tests are completely normal, you will receive your results only by: [place] (if you have MyChart) OR A paper copy in the mail If you have any lab test that is abnormal or we need to change your treatment, we will call you to review the results.  Testing/Procedures: NONE ordered at this time of appointment   Follow-Up: At [facility], you and your health needs are our priority.  As part of our continuing mission to provide you with exceptional heart care, we have created designated Provider Care Teams.  These Care Teams include your primary Cardiologist (physician) and Advanced Practice Providers (APPs -  Physician Assistants and Nurse Practitioners) who all work together to provide you with the care you need, when you need it.   Your next appointment:   6 month(s)  The format for your next appointment:   In Person  Provider:   [name], MD     Other Instructions  Important Information About Sugar

## 2022-02-14 ENCOUNTER — Encounter: Payer: Self-pay | Admitting: Cardiovascular Disease

## 2022-02-17 ENCOUNTER — Ambulatory Visit: Payer: Managed Care, Other (non HMO) | Admitting: Nurse Practitioner

## 2022-02-18 ENCOUNTER — Ambulatory Visit (INDEPENDENT_AMBULATORY_CARE_PROVIDER_SITE_OTHER): Payer: Managed Care, Other (non HMO) | Admitting: Nurse Practitioner

## 2022-02-18 ENCOUNTER — Encounter: Payer: Self-pay | Admitting: Nurse Practitioner

## 2022-02-18 VITALS — Temp 97.7°F

## 2022-02-18 DIAGNOSIS — R051 Acute cough: Secondary | ICD-10-CM | POA: Diagnosis not present

## 2022-02-18 DIAGNOSIS — U071 COVID-19: Secondary | ICD-10-CM | POA: Diagnosis not present

## 2022-02-18 DIAGNOSIS — J22 Unspecified acute lower respiratory infection: Secondary | ICD-10-CM | POA: Diagnosis not present

## 2022-02-18 LAB — POCT INFLUENZA A/B
Influenza A, POC: NEGATIVE
Influenza B, POC: NEGATIVE

## 2022-02-18 LAB — POC COVID19 BINAXNOW: SARS Coronavirus 2 Ag: POSITIVE — AB

## 2022-02-18 MED ORDER — NIRMATRELVIR/RITONAVIR (PAXLOVID)TABLET: 3.0000 | ORAL_TABLET | Freq: Two times a day (BID) | ORAL | 0 refills | Status: AC

## 2022-02-18 NOTE — Patient Instructions (Signed)
Your COVID test is positive. You should remain isolated and quarantine for at least 5 days from start of symptoms. You must be feeling better and be fever free without any fever reducers for at least 24 hours as well. You should wear a mask at all times when out of your home or around others for 5 days after leaving isolation.  Your household contacts should be tested as well as work contacts. If you feel worse or have increasing shortness of breath, you should be seen in person at urgent care or the emergency room.   Rx: Paxlovid Recommend rest, fluids, 3 meals per day.  Fever reducing medicines.  OTC cold and congestion medicines.

## 2022-02-18 NOTE — Assessment & Plan Note (Signed)
Recommend rest, fluids, 3 meals per day.  Fever reducing medicines.  OTC cold and congestion medicines.     Reviewed home care instructions for COVID. Advised self-isolation at home for at least 5 days. After 5 days, if improved and fever resolved, can be in public, but should wear a mask around others for an additional 5 days. If symptoms, esp, dyspnea develops/worsens, recommend in-person evaluation at either an urgent care or the emergency room.

## 2022-02-18 NOTE — Progress Notes (Signed)
Acute Office Visit  Subjective:    Patient ID: Jacob Collier, male    DOB: [birth year], 66 y.o.   MRN: [id number]  CHIEF COMPLAINT;  Cough, headache, congestion  HPI: Patient is in today for COVID-19 like symptoms, He complained of headache, wheezing, cough, congestion, sore throat, generalized body aches, weakness since past 2 days. Patient is having difficulty to resume his daily activities.   Past Medical History:  Diagnosis Date   Anxiety    Arthritis    Coronary artery disease    s/p prior stenting to RCA. Last cath in 05/2019 showed in-stent CTO of RCA and CTO of LCX (medical therpay recommended)   Diverticulosis    Dyspnea    Hard of hearing    Hyperlipidemia    Hypertension    hx of elevation on lyrica, off med and now normal    Paroxysmal SVT (supraventricular tachycardia)    Prostate cancer (HCC)    Wears glasses     Past Surgical History:  Procedure Laterality Date   ANTERIOR CERVICAL DECOMP/DISCECTOMY FUSION N/A 09/03/2021   Procedure: CERVICAL FIVE-SIX, CERVICAL SIX-SEVEN ANTERIOR CERVICAL DECOMPRESSION/DISCECTOMY FUSION;  Surgeon: Earnie Larsson, MD;  Location: [facility];  Service: Neurosurgery;  Laterality: N/A;   CARDIAC CATHETERIZATION     with 2 stents    COLECTOMY  05/2018   COLONOSCOPY     CORONARY ARTERY BYPASS GRAFT N/A 01/19/2021   Procedure: CORONARY ARTERY BYPASS GRAFTING (CABG), ON PUMP TIMES FOUR, USING LEFT INTERNAL MAMMARY ARTERY AND ENDOSOCPICALLY HARVESTED RIGHT GREATER SAPHENOUS VEIN;  Surgeon: Lajuana Matte, MD;  Location: [facility];  Service: Open Heart Surgery;  Laterality: N/A;   CYSTOSCOPY WITH URETHRAL DILATATION N/A 01/14/2021   Procedure: CYSTOSCOPY WITH BALLOON  DILATATION;  Surgeon: Bjorn Loser, MD;  Location: [facility];  Service: Urology;  Laterality: N/A;   EYE SURGERY  02/2020   bilaterl cataracts   IR THORACENTESIS ASP PLEURAL SPACE W/IMG GUIDE  02/02/2021   LEFT HEART CATH AND CORONARY ANGIOGRAPHY N/A 12/24/2020   Procedure:  LEFT HEART CATH AND CORONARY ANGIOGRAPHY;  Surgeon: Martinique, Peter M, MD;  Location: [facility];  Service: Cardiovascular;  Laterality: N/A;   PROSTATECTOMY     TEE WITHOUT CARDIOVERSION N/A 01/19/2021   Procedure: TRANSESOPHAGEAL ECHOCARDIOGRAM (TEE);  Surgeon: Lajuana Matte, MD;  Location: [facility];  Service: Open Heart Surgery;  Laterality: N/A;   TOTAL HIP ARTHROPLASTY Left 11/10/2015   Procedure: LEFT TOTAL HIP ARTHROPLASTY ANTERIOR APPROACH;  Surgeon: Mcarthur Rossetti, MD;  Location: [facility];  Service: Orthopedics;  Laterality: Left;    Family History  Problem Relation Age of Onset   Alzheimer's disease Mother    Stroke Father    Diabetes Sister    Heart attack Maternal Grandmother    Cancer Maternal Uncle        prostate   Prostate cancer Maternal Uncle    Cancer Maternal Uncle        prostate   Prostate cancer Maternal Uncle    Colon polyps Brother    Colon cancer Neg Hx    Esophageal cancer Neg Hx    Rectal cancer Neg Hx    Stomach cancer Neg Hx     Social History   Socioeconomic History   Marital status: Married    Spouse name: Not on file   Number of children: 0   Years of education: Not on file   Highest education level: Not on file  Occupational History   Occupation: Employed  at [employer]  Tobacco Use   Smoking status: Never   Smokeless tobacco: Never  Vaping Use   Vaping Use: Never used  Substance and Sexual Activity   Alcohol use: Not Currently    Alcohol/week: 0.0 standard drinks of alcohol    Comment: rarely   Drug use: No   Sexual activity: Yes    Partners: Female    Birth control/protection: None  Other Topics Concern   Not on file  Social History Narrative   Not on file   Social Determinants of Health   Financial Resource Strain: Not on file  Food Insecurity: Not on file  Transportation Needs: Not on file  Physical Activity: Not on file  Stress: Not on file  Social Connections: Not on file  Intimate Partner Violence:  Not on file    Outpatient Medications Prior to Visit  Medication Sig Dispense Refill   acetaminophen (TYLENOL) 500 MG tablet Take 1,000 mg by mouth every 8 (eight) hours as needed for mild pain.     aspirin EC 81 MG tablet Take 81 mg by mouth daily. Swallow whole.     clonazePAM (KLONOPIN) 1 MG tablet TAKE 1 TABLET BY MOUTH TWICE A DAY AS NEEDED FOR ANXIETY 60 tablet 3   clopidogrel (PLAVIX) 75 MG tablet TAKE 1 TABLET (75 MG TOTAL) BY MOUTH DAILY. STOP TAKING PLAVIX ON SATURDAY NOV. 5, 2022 90 tablet 3   Eszopiclone 3 MG TABS TAKE 1 TABLET BY MOUTH EVERYDAY AT BEDTIME 30 tablet 2   Evolocumab (REPATHA SURECLICK) 732 MG/ML SOAJ INJECT 140 MG INTO THE SKIN EVERY 14 (FOURTEEN) DAYS. 6 mL 3   ezetimibe (ZETIA) 10 MG tablet TAKE 1 TABLET BY MOUTH EVERY DAY 90 tablet 3   metoprolol succinate (TOPROL-XL) 25 MG 24 hr tablet Take 0.5 tablets (12.5 mg total) by mouth daily. Take with or immediately following a meal. (Patient taking differently: Take 12.5 mg by mouth 2 (two) times daily. Take with or immediately following a meal.) 45 tablet 2   Omega-3 Fatty Acids (OMEGA 3 PO) Take 1 capsule by mouth 2 (two) times daily.     No facility-administered medications prior to visit.    Allergies  Allergen Reactions   Ranolazine     Chest discomfort/Reflux   Atorvastatin     myalgias Other reaction(s): Myalgias (intolerance) myalgias   Pravastatin     myalgias Other reaction(s): Myalgias (intolerance) myalgias   Rosuvastatin     myalgias Other reaction(s): Myalgias (intolerance) myalgias   Statins Other (See Comments)    myalgias    Review of Systems  Constitutional:  Positive for fatigue and fever.  HENT:  Positive for congestion, rhinorrhea and sore throat. Negative for sinus pressure and sinus pain.   Respiratory:  Positive for cough.   Cardiovascular:  Negative for chest pain.  Musculoskeletal:  Positive for arthralgias (generalized body ache and bone pain). Negative for back pain.   Neurological:  Positive for headaches.      Objective:    Vitals : unable to take BP, triaged from Car, temp 97.7, Oxygen saturation:92  Physical Exam Constitutional:      General: He is in acute distress.  Cardiovascular:     Rate and Rhythm: Normal rate and regular rhythm.  Pulmonary:     Breath sounds: Examination of the right-upper field reveals wheezing. Examination of the left-upper field reveals wheezing. Wheezing present.  Abdominal:     Palpations: Abdomen is soft.  Musculoskeletal:  General: Normal range of motion.  Neurological:     Mental Status: He is oriented to person, place, and time.     Motor: Weakness (generalized) present.  Psychiatric:        Mood and Affect: Mood normal.        Thought Content: Thought content normal.        Judgment: Judgment normal.     Temp 97.7 F (36.5 C)   SpO2 92%  Wt Readings from Last 3 Encounters:  02/07/22 228 lb (103.4 kg)  01/26/22 221 lb (100.2 kg)  12/29/21 221 lb (100.2 kg)    Health Maintenance Due  Topic Date Due   Medicare Annual Wellness (AWV)  Never done   DTaP/Tdap/Td (1 - Tdap) Never done   Zoster Vaccines- Shingrix (1 of 2) Never done   Pneumonia Vaccine 31+ Years old (1 - PCV) Never done    There are no preventive care reminders to display for this patient.   Lab Results  Component Value Date   TSH 2.450 07/05/2021   Lab Results  Component Value Date   WBC 8.0 11/11/2021   HGB 16.4 11/11/2021   HCT 48.6 11/11/2021   MCV 85 11/11/2021   PLT 212 11/11/2021   Lab Results  Component Value Date   NA 140 12/29/2021   K 4.3 12/29/2021   CO2 19 (L) 12/29/2021   GLUCOSE 75 12/29/2021   BUN 19 12/29/2021   CREATININE 1.25 12/29/2021   BILITOT 0.7 12/29/2021   ALKPHOS 71 12/29/2021   AST 28 12/29/2021   ALT 31 12/29/2021   PROT 7.2 12/29/2021   ALBUMIN 4.6 12/29/2021   CALCIUM 9.3 12/29/2021   ANIONGAP 8 08/26/2021   EGFR 63 12/29/2021   Lab Results  Component Value Date   CHOL  75 (L) 07/05/2021   Lab Results  Component Value Date   HDL 36 (L) 07/05/2021   Lab Results  Component Value Date   LDLCALC 20 07/05/2021   Lab Results  Component Value Date   TRIG 94 07/05/2021   Lab Results  Component Value Date   CHOLHDL 2.1 07/05/2021   Lab Results  Component Value Date   HGBA1C 5.8 (H) 03/04/2021       Assessment & Plan:   Problem List Items Addressed This Visit       Respiratory   Lower respiratory tract infection due to COVID-19 virus - Primary    Recommend rest, fluids, 3 meals per day.  Fever reducing medicines.  OTC cold and congestion medicines.     Reviewed home care instructions for COVID. Advised self-isolation at home for at least 5 days. After 5 days, if improved and fever resolved, can be in public, but should wear a mask around others for an additional 5 days. If symptoms, esp, dyspnea develops/worsens, recommend in-person evaluation at either an urgent care or the emergency room.       Relevant Medications   nirmatrelvir/ritonavir EUA (PAXLOVID) 20 x 150 MG & 10 x 100MG TABS   Other Relevant Orders   POC COVID-19 BinaxNow (Completed)   POCT Influenza A/B (Completed)     Other   Acute cough    Recommend rest, fluids, 3 meals per day.  Fever reducing medicines.  OTC cold and congestion medicines.        Relevant Medications   nirmatrelvir/ritonavir EUA (PAXLOVID) 20 x 150 MG & 10 x 100MG TABS   Meds ordered this encounter  Medications   nirmatrelvir/ritonavir EUA (PAXLOVID) 20 x  150 MG & 10 x 100MG TABS    Sig: Take 3 tablets by mouth 2 (two) times daily for 5 days. (Take nirmatrelvir 150 mg two tablets twice daily for 5 days and ritonavir 100 mg one tablet twice daily for 5 days) Patient GFR is 63    Dispense:  30 tablet    Refill:  0    Order Specific Question:   Supervising Provider    AnswerShelton Silvas    Orders Placed This Encounter  Procedures   POC COVID-19 BinaxNow   POCT Influenza A/B      Follow-up:  as needed  I, Terriana Barreras have reviewed all documentation for this visit. The documentation on 02/18/22   for the exam, diagnosis, procedures, and orders are all accurate and complete.      An After Visit Summary was printed and given to the patient.  Neil Crouch, DNP, FNP-BC, FNP-C [facility] [phone]

## 2022-02-18 NOTE — Assessment & Plan Note (Signed)
Recommend rest, fluids, 3 meals per day.  Fever reducing medicines.  OTC cold and congestion medicines.

## 2022-02-25 ENCOUNTER — Other Ambulatory Visit: Payer: Self-pay | Admitting: Student

## 2022-02-25 ENCOUNTER — Other Ambulatory Visit: Payer: Self-pay | Admitting: Cardiovascular Disease

## 2022-03-03 ENCOUNTER — Encounter: Payer: Self-pay | Admitting: Nurse Practitioner

## 2022-03-03 ENCOUNTER — Ambulatory Visit (INDEPENDENT_AMBULATORY_CARE_PROVIDER_SITE_OTHER): Payer: Managed Care, Other (non HMO) | Admitting: Nurse Practitioner

## 2022-03-03 VITALS — BP 138/70 | HR 65 | Temp 97.6°F | Resp 18 | Ht 71.0 in | Wt 223.0 lb

## 2022-03-03 DIAGNOSIS — R051 Acute cough: Secondary | ICD-10-CM

## 2022-03-03 DIAGNOSIS — J40 Bronchitis, not specified as acute or chronic: Secondary | ICD-10-CM | POA: Diagnosis not present

## 2022-03-03 LAB — POCT INFLUENZA A/B
Influenza A, POC: NEGATIVE
Influenza B, POC: NEGATIVE

## 2022-03-03 MED ORDER — PREDNISONE 10 MG PO TABS: ORAL_TABLET | ORAL | 0 refills | Status: AC

## 2022-03-03 MED ORDER — AZITHROMYCIN 250 MG PO TABS: ORAL_TABLET | ORAL | 0 refills | Status: AC

## 2022-03-03 MED ORDER — PROMETHAZINE-DM 6.25-15 MG/5ML PO SYRP: 5.0000 mL | ORAL_SOLUTION | Freq: Four times a day (QID) | ORAL | 0 refills | Status: DC | PRN

## 2022-03-03 NOTE — Patient Instructions (Signed)
Take antibiotics as prescribed Finish the course of prednisone Take Promethazine-DM up to 4 times daily for cough/sinus congestion Drink plenty of fluids Follow-up as needed    Acute Bronchitis, Adult  Acute bronchitis is sudden inflammation of the main airways (bronchi) that come off the windpipe (trachea) in the lungs. The swelling causes the airways to get smaller and make more mucus than normal. This can make it hard to breathe and can cause coughing or noisy breathing (wheezing). Acute bronchitis may last several weeks. The cough may last longer. Allergies, asthma, and exposure to smoke may make the condition worse. What are the causes? This condition can be caused by germs and by substances that irritate the lungs, including: Cold and flu viruses. The most common cause of this condition is the virus that causes the common cold. Bacteria. This is less common. Breathing in substances that irritate the lungs, including: Smoke from cigarettes and other forms of tobacco. Dust and pollen. Fumes from household cleaning products, gases, or burned fuel. Indoor or outdoor air pollution. What increases the risk? The following factors may make you more likely to develop this condition: A weak body's defense system, also called the immune system. A condition that affects your lungs and breathing, such as asthma. What are the signs or symptoms? Common symptoms of this condition include: Coughing. This may bring up clear, yellow, or green mucus from your lungs (sputum). Wheezing. Runny or stuffy nose. Having too much mucus in your lungs (chest congestion). Shortness of breath. Aches and pains, including sore throat or chest. How is this diagnosed? This condition is usually diagnosed based on: Your symptoms and medical history. A physical exam. You may also have other tests, including tests to rule out other conditions, such as pneumonia. These tests include: A test of lung function. Test  of a mucus sample to look for the presence of bacteria. Tests to check the oxygen level in your blood. Blood tests. Chest X-ray. How is this treated? Most cases of acute bronchitis clear up over time without treatment. Your health care provider may recommend: Drinking more fluids to help thin your mucus so it is easier to cough up. Taking inhaled medicine (inhaler) to improve air flow in and out of your lungs. Using a vaporizer or a humidifier. These are machines that add water to the air to help you breathe better. Taking a medicine that thins mucus and clears congestion (expectorant). Taking a medicine that prevents or stops coughing (cough suppressant). It is not common to take an antibiotic medicine for this condition. Follow these instructions at home:  Take over-the-counter and prescription medicines only as told by your health care provider. Use an inhaler, vaporizer, or humidifier as told by your health care provider. Take two teaspoons (10 mL) of honey at bedtime to lessen coughing at night. Drink enough fluid to keep your urine pale yellow. Do not use any products that contain nicotine or tobacco. These products include cigarettes, chewing tobacco, and vaping devices, such as e-cigarettes. If you need help quitting, ask your health care provider. Get plenty of rest. Return to your normal activities as told by your health care provider. Ask your health care provider what activities are safe for you. Keep all follow-up visits. This is important. How is this prevented? To lower your risk of getting this condition again: Wash your hands often with soap and water for at least 20 seconds. If soap and water are not available, use hand sanitizer. Avoid contact with people who  have cold symptoms. Try not to touch your mouth, nose, or eyes with your hands. Avoid breathing in smoke or chemical fumes. Breathing smoke or chemical fumes will make your condition worse. Get the flu shot every  year. Contact a health care provider if: Your symptoms do not improve after 2 weeks. You have trouble coughing up the mucus. Your cough keeps you awake at night. You have a fever. Get help right away if you: Cough up blood. Feel pain in your chest. Have severe shortness of breath. Faint or keep feeling like you are going to faint. Have a severe headache. Have a fever or chills that get worse. These symptoms may represent a serious problem that is an emergency. Do not wait to see if the symptoms will go away. Get medical help right away. Call your local emergency services (911 in the [place] Do not drive yourself to the hospital. Summary Acute bronchitis is inflammation of the main airways (bronchi) that come off the windpipe (trachea) in the lungs. The swelling causes the airways to get smaller and make more mucus than normal. Drinking more fluids can help thin your mucus so it is easier to cough up. Take over-the-counter and prescription medicines only as told by your health care provider. Do not use any products that contain nicotine or tobacco. These products include cigarettes, chewing tobacco, and vaping devices, such as e-cigarettes. If you need help quitting, ask your health care provider. Contact a health care provider if your symptoms do not improve after 2 weeks. This information is not intended to replace advice given to you by your health care provider. Make sure you discuss any questions you have with your health care provider. Document Revised: 06/03/2021 Document Reviewed: 06/24/2020 Elsevier Patient Education  [place].

## 2022-03-03 NOTE — Assessment & Plan Note (Signed)
  Take medicines as prescribed, taper the prednisone dose  Take Promethazine-DM up to 4 times daily for cough Drink plenty of fluids Follow-up as needed

## 2022-03-03 NOTE — Assessment & Plan Note (Signed)
Recommend rest, fluids, 3 meals perday.  Fever reducing medicines.  OTC cold and congestion medicines.

## 2022-03-03 NOTE — Progress Notes (Signed)
Acute Office Visit  Subjective:    Patient ID: Jacob Collier, male    DOB: [birth year], 66 y.o.   MRN: [id number]  Chief Complaints: Cough, congestion  HPI: Patient is in today for after completing the course of Paxlovid and still having congestion, cough, wheezing, low grade fever (99.1), very much congested on his chest going on past 2 weeks. His voice is  some hoarse and he feels miserable. After finishing the course of Paxlovid he was better for some days until for Christmas there was a big family gathering and he felt bad again.  Past Medical History:  Diagnosis Date   Anxiety    Arthritis    Coronary artery disease    s/p prior stenting to RCA. Last cath in 05/2019 showed in-stent CTO of RCA and CTO of LCX (medical therpay recommended)   Diverticulosis    Dyspnea    Hard of hearing    Hyperlipidemia    Hypertension    hx of elevation on lyrica, off med and now normal    Paroxysmal SVT (supraventricular tachycardia)    Prostate cancer (HCC)    Wears glasses     Past Surgical History:  Procedure Laterality Date   ANTERIOR CERVICAL DECOMP/DISCECTOMY FUSION N/A 09/03/2021   Procedure: CERVICAL FIVE-SIX, CERVICAL SIX-SEVEN ANTERIOR CERVICAL DECOMPRESSION/DISCECTOMY FUSION;  Surgeon: Earnie Larsson, MD;  Location: [facility];  Service: Neurosurgery;  Laterality: N/A;   CARDIAC CATHETERIZATION     with 2 stents    COLECTOMY  05/2018   COLONOSCOPY     CORONARY ARTERY BYPASS GRAFT N/A 01/19/2021   Procedure: CORONARY ARTERY BYPASS GRAFTING (CABG), ON PUMP TIMES FOUR, USING LEFT INTERNAL MAMMARY ARTERY AND ENDOSOCPICALLY HARVESTED RIGHT GREATER SAPHENOUS VEIN;  Surgeon: Lajuana Matte, MD;  Location: [facility];  Service: Open Heart Surgery;  Laterality: N/A;   CYSTOSCOPY WITH URETHRAL DILATATION N/A 01/14/2021   Procedure: CYSTOSCOPY WITH BALLOON  DILATATION;  Surgeon: Bjorn Loser, MD;  [facility];  Service: Urology;  Laterality: N/A;   EYE SURGERY  02/2020   bilaterl  cataracts   IR THORACENTESIS ASP PLEURAL SPACE W/IMG GUIDE  02/02/2021   LEFT HEART CATH AND CORONARY ANGIOGRAPHY N/A 12/24/2020   Procedure: LEFT HEART CATH AND CORONARY ANGIOGRAPHY;  Surgeon: Martinique, Peter M, MD;  Location: [facility];  Service: Cardiovascular;  Laterality: N/A;   PROSTATECTOMY     TEE WITHOUT CARDIOVERSION N/A 01/19/2021   Procedure: TRANSESOPHAGEAL ECHOCARDIOGRAM (TEE);  Surgeon: Lajuana Matte, MD;  Location: [facility];  Service: Open Heart Surgery;  Laterality: N/A;   TOTAL HIP ARTHROPLASTY Left 11/10/2015   Procedure: LEFT TOTAL HIP ARTHROPLASTY ANTERIOR APPROACH;  Surgeon: Mcarthur Rossetti, MD;  Location: [facility];  Service: Orthopedics;  Laterality: Left;    Family History  Problem Relation Age of Onset   Alzheimer's disease Mother    Stroke Father    Diabetes Sister    Heart attack Maternal Grandmother    Cancer Maternal Uncle        prostate   Prostate cancer Maternal Uncle    Cancer Maternal Uncle        prostate   Prostate cancer Maternal Uncle    Colon polyps Brother    Colon cancer Neg Hx    Esophageal cancer Neg Hx    Rectal cancer Neg Hx    Stomach cancer Neg Hx     Social History   Socioeconomic History   Marital status: Married    Spouse name: Not  on file   Number of children: 0   Years of education: Not on file   Highest education level: Not on file  Occupational History   Occupation: Employed at [employer]  Tobacco Use   Smoking status: Never   Smokeless tobacco: Never  Vaping Use   Vaping Use: Never used  Substance and Sexual Activity   Alcohol use: Not Currently    Alcohol/week: 0.0 standard drinks of alcohol    Comment: rarely   Drug use: No   Sexual activity: Yes    Partners: Female    Birth control/protection: None  Other Topics Concern   Not on file  Social History Narrative   Not on file   Social Determinants of Health   Financial Resource Strain: Not on file  Food Insecurity: Not on file   Transportation Needs: Not on file  Physical Activity: Not on file  Stress: Not on file  Social Connections: Not on file  Intimate Partner Violence: Not on file    Outpatient Medications Prior to Visit  Medication Sig Dispense Refill   acetaminophen (TYLENOL) 500 MG tablet Take 1,000 mg by mouth every 8 (eight) hours as needed for mild pain.     aspirin EC 81 MG tablet Take 81 mg by mouth daily. Swallow whole.     clonazePAM (KLONOPIN) 1 MG tablet TAKE 1 TABLET BY MOUTH TWICE A DAY AS NEEDED FOR ANXIETY 60 tablet 3   clopidogrel (PLAVIX) 75 MG tablet TAKE 1 TABLET (75 MG TOTAL) BY MOUTH DAILY. STOP TAKING PLAVIX ON SATURDAY NOV. 5, 2022 90 tablet 3   Eszopiclone 3 MG TABS TAKE 1 TABLET BY MOUTH EVERYDAY AT BEDTIME 30 tablet 2   Evolocumab (REPATHA SURECLICK) 951 MG/ML SOAJ INJECT 140 MG INTO THE SKIN EVERY 14 (FOURTEEN) DAYS. 6 mL 3   ezetimibe (ZETIA) 10 MG tablet TAKE 1 TABLET BY MOUTH EVERY DAY 90 tablet 3   metoprolol succinate (TOPROL-XL) 25 MG 24 hr tablet TAKE 0.5 TABLET BY MOUTH DAILY. TAKE WITH OR IMMEDIATELY FOLLOWING A MEAL. 45 tablet 2   Omega-3 Fatty Acids (OMEGA 3 PO) Take 1 capsule by mouth 2 (two) times daily.     No facility-administered medications prior to visit.    Allergies  Allergen Reactions   Ranolazine     Chest discomfort/Reflux   Atorvastatin     myalgias Other reaction(s): Myalgias (intolerance) myalgias   Pravastatin     myalgias Other reaction(s): Myalgias (intolerance) myalgias   Rosuvastatin     myalgias Other reaction(s): Myalgias (intolerance) myalgias   Statins Other (See Comments)    myalgias    Review of Systems  Constitutional:  Positive for fatigue. Negative for fever.  HENT:  Positive for congestion. Negative for ear pain and sore throat.   Respiratory:  Positive for cough, shortness of breath and wheezing.   Cardiovascular:  Negative for chest pain and palpitations.  Gastrointestinal:  Negative for abdominal pain, constipation,  diarrhea, nausea and vomiting.  Endocrine: Negative for polydipsia, polyphagia and polyuria.  Genitourinary:  Negative for dysuria and frequency.  Musculoskeletal:  Negative for arthralgias and back pain.  Neurological:  Negative for dizziness and headaches.  Psychiatric/Behavioral:  Negative for dysphoric mood. The patient is not nervous/anxious.        Objective:    Physical Exam Vitals reviewed.  Constitutional:      General: He is in acute distress.     Appearance: He is ill-appearing.  HENT:     Right Ear: Tympanic membrane  normal.     Left Ear: Tympanic membrane normal.     Nose: Congestion present.  Neck:     Vascular: No carotid bruit.  Cardiovascular:     Rate and Rhythm: Normal rate and regular rhythm.     Pulses: Normal pulses.     Heart sounds: Normal heart sounds.  Pulmonary:     Breath sounds: Wheezing (bilateral upper lobes) present. No rales (faint rales in Bilateral lower lobes).  Abdominal:     Tenderness: There is no abdominal tenderness.  Neurological:     Mental Status: He is alert and oriented to person, place, and time.  Psychiatric:        Mood and Affect: Mood normal.        Behavior: Behavior normal.    BP 138/70   Pulse 65   Temp 97.6 F (36.4 C)   Resp 18   Ht _0  (1.803 m)   Wt 223 lb (101.2 kg)   SpO2 98%   BMI 31.10 kg/m  Wt Readings from Last 3 Encounters:  03/03/22 223 lb (101.2 kg)  02/07/22 228 lb (103.4 kg)  01/26/22 221 lb (100.2 kg)    Health Maintenance Due  Topic Date Due   Medicare Annual Wellness (AWV)  Never done   DTaP/Tdap/Td (1 - Tdap) Never done   Zoster Vaccines- Shingrix (1 of 2) Never done   Pneumonia Vaccine 66+ Years old (1 - PCV) Never done    There are no preventive care reminders to display for this patient.   Lab Results  Component Value Date   TSH 2.450 07/05/2021   Lab Results  Component Value Date   WBC 8.0 11/11/2021   HGB 16.4 11/11/2021   HCT 48.6 11/11/2021   MCV 85 11/11/2021    PLT 212 11/11/2021   Lab Results  Component Value Date   NA 140 12/29/2021   K 4.3 12/29/2021   CO2 19 (L) 12/29/2021   GLUCOSE 75 12/29/2021   BUN 19 12/29/2021   CREATININE 1.25 12/29/2021   BILITOT 0.7 12/29/2021   ALKPHOS 71 12/29/2021   AST 28 12/29/2021   ALT 31 12/29/2021   PROT 7.2 12/29/2021   ALBUMIN 4.6 12/29/2021   CALCIUM 9.3 12/29/2021   ANIONGAP 8 08/26/2021   EGFR 63 12/29/2021   Lab Results  Component Value Date   CHOL 75 (L) 07/05/2021   Lab Results  Component Value Date   HDL 36 (L) 07/05/2021   Lab Results  Component Value Date   LDLCALC 20 07/05/2021   Lab Results  Component Value Date   TRIG 94 07/05/2021   Lab Results  Component Value Date   CHOLHDL 2.1 07/05/2021   Lab Results  Component Value Date   HGBA1C 5.8 (H) 03/04/2021     Assessment & Plan:   Problem List Items Addressed This Visit       Respiratory   Bronchitis with acute wheezing - Primary     Take medicines as prescribed, taper the prednisone dose  Take Promethazine-DM up to 4 times daily for cough Drink plenty of fluids Follow-up as needed       Relevant Medications   azithromycin (ZITHROMAX) 250 MG tablet   predniSONE (DELTASONE) 10 MG tablet   promethazine-dextromethorphan (PROMETHAZINE-DM) 6.25-15 MG/5ML syrup     Other   Acute cough    Recommend rest, fluids, 3 meals perday.  Fever reducing medicines.  OTC cold and congestion medicines.        Relevant Orders  POCT Influenza A/B (Completed)     Follow-up: as needed I, Jacody Beneke have reviewed all documentation for this visit. The documentation on 03/03/22   for the exam, diagnosis, procedures, and orders are all accurate and complete.       An After Visit Summary was printed and given to the patient.  Neil Crouch, DNP, FNP-BC, FNP-C [facility] [phone]

## 2022-03-21 ENCOUNTER — Telehealth: Payer: Self-pay | Admitting: Pharmacist

## 2022-03-21 NOTE — Telephone Encounter (Signed)
PA renewal request received Key: [id number] PA submitted

## 2022-03-24 ENCOUNTER — Telehealth: Payer: Self-pay | Admitting: Student

## 2022-03-24 NOTE — Telephone Encounter (Signed)
STAT if HR is under 50 or over 120 (normal HR is 60-100 beats per minute)  What is your heart rate?  70-75 to 145 stays for 2-5 minutes and drops back down.  He states it has happened multiple times a day  Do you have a log of your heart rate readings (document readings)?   Do you have any other symptoms? No   Pt states he has been experiencing some hr issues and would like to speak to [place], [place] about it.

## 2022-03-24 NOTE — Telephone Encounter (Signed)
Spoke with pt regarding issues with heart rate over the last month or two. Pt states that he had RSV and then shortly after that he had Covid, he states that he has had more frequent episodes since then. Pt states that he can tell when it is about to happen he says the room goes dim and then he places his pulse ox on and his heart rate shoot up to 145bpm. He says that an episode can last around 2-5 minutes and then heart rate will return to a normal range around 75bpm. Pt states that episodes can happen at anytime, sometimes a few times a day or one every couple of days. Pt states nothing makes it stop, he has tried stopping whatever activity he is doing and slow breathing. Pt states this does not feel like a-fib. Pt does not have anyway of checking his heart rhythm at home. Pt would like office visit to discuss with [facility], [place] and decide on "game plan." Office visit scheduled with pt. Pt verbalizes understanding.  Per review of pt's chart has been treated by Dr. Henrene Pastor (PCP) for paroxysmal SVT in the past.    Will route to [facility] to make aware.

## 2022-03-28 ENCOUNTER — Telehealth: Payer: Self-pay | Admitting: Physical Medicine and Rehabilitation

## 2022-03-28 NOTE — Telephone Encounter (Signed)
Patient would like to come back to discuss injection for back pain he had to cancel last year because he had a surgery

## 2022-03-29 ENCOUNTER — Ambulatory Visit (INDEPENDENT_AMBULATORY_CARE_PROVIDER_SITE_OTHER): Payer: Managed Care, Other (non HMO)

## 2022-03-29 ENCOUNTER — Encounter: Payer: Self-pay | Admitting: Physician Assistant

## 2022-03-29 ENCOUNTER — Ambulatory Visit: Payer: Managed Care, Other (non HMO) | Admitting: Physician Assistant

## 2022-03-29 DIAGNOSIS — M25552 Pain in left hip: Secondary | ICD-10-CM | POA: Diagnosis not present

## 2022-03-29 MED ORDER — LIDOCAINE HCL 1 % IJ SOLN
2.0000 mL | INTRAMUSCULAR | Status: AC | PRN
  Administered 2022-03-29: 2 mL

## 2022-03-29 MED ORDER — BUPIVACAINE HCL 0.25 % IJ SOLN
2.0000 mL | INTRAMUSCULAR | Status: AC | PRN
  Administered 2022-03-29: 2 mL via INTRA_ARTICULAR

## 2022-03-29 MED ORDER — METHYLPREDNISOLONE ACETATE 40 MG/ML IJ SUSP
80.0000 mg | INTRAMUSCULAR | Status: AC | PRN
  Administered 2022-03-29: 80 mg via INTRA_ARTICULAR

## 2022-03-29 NOTE — Telephone Encounter (Signed)
LVM to return call to get more information

## 2022-03-29 NOTE — Progress Notes (Signed)
Office Visit Note   Patient: Jacob Collier           Date of Birth: 04-08-1955           MRN: [id number] Visit Date: 03/29/2022              Requested by: No referring provider defined for this encounter. PCP: [name], MD (Inactive)  Chief Complaint  Patient presents with  . Left Hip - Pain      HPI: Jacob Collier comes in today accompanied by his wife.  He is complaining of left lateral hip pain.  He does have a history of trochanteric bursitis and has had it injected a couple times in the past.  Over the holidays he did have a hard fall onto his left hip.  He is 7 years status post left hip replacement by Dr. Ninfa Linden.  Denies any groin pain.  He complains only of isolated pain to the lateral side of his hip worse when he rolls over at night.  Assessment & Plan: Visit Diagnoses:  1. Pain of left hip     Plan: X-rays do not demonstrate any fractures or dislocation in his components of his hip replacement actually look quite good.  He does have some calcification off the greater trochanter.  Exam consistent with trochanteric bursitis.  Will go forward and inject him today.  May follow-up with Dr. Ninfa Linden as needed  Follow-Up Instructions: No follow-ups on file.   Ortho Exam  Patient is alert, oriented, no adenopathy, well-dressed, normal affect, normal respiratory effort. Left hip he has no erythema no fluctuance.  He has a well-healed anterior incision from his hip replacement.  His compartments are soft and nontender he is neurovascularly intact without any radicular findings.  No pain with manipulation of his hip.  He does have pain over the greater trochanter  Imaging: XR HIP UNILAT W OR W/O PELVIS 2-3 VIEWS LEFT  Result Date: 03/29/2022 Radiographs of his left hip were taken today.  He is status post left total hip replacement.  Components are in good position no evidence of dislocation no evidence of any fracture no evidence of any significant wear  No images are  attached to the encounter.  Labs: Lab Results  Component Value Date   HGBA1C 5.8 (H) 03/04/2021   HGBA1C 5.8 (H) 01/15/2021   HGBA1C 5.8 (H) 11/28/2019   ESRSEDRATE 9 04/01/2020   CRP 1 10/26/2020   REPTSTATUS 09/26/2014 FINAL 09/24/2014   CULT  09/24/2014    >=100,000 COLONIES/mL KLEBSIELLA PNEUMONIAE Performed at [facility] 09/24/2014     Lab Results  Component Value Date   ALBUMIN 4.6 12/29/2021   ALBUMIN 4.5 12/21/2021   ALBUMIN 4.3 07/05/2021    Lab Results  Component Value Date   MG 2.2 01/20/2021   MG 2.2 01/20/2021   MG 2.3 01/19/2021   No results found for: "VD25OH"  No results found for: "PREALBUMIN"    Latest Ref Rng & Units 11/11/2021    4:28 PM 08/26/2021    3:10 PM 07/05/2021    8:55 AM  CBC EXTENDED  WBC 3.4 - 10.8 x10E3/uL 8.0  7.7  6.8   RBC 4.14 - 5.80 x10E6/uL 5.69  6.14  5.82   Hemoglobin 13.0 - 17.7 g/dL 16.4  18.4  16.8   HCT 37.5 - 51.0 % 48.6  54.9  50.5   Platelets 150 - 450 x10E3/uL 212  185  187  There is no height or weight on file to calculate BMI.  Orders:  Orders Placed This Encounter  Procedures  . XR HIP UNILAT W OR W/O PELVIS 2-3 VIEWS LEFT   No orders of the defined types were placed in this encounter.    Procedures: Large Joint Inj: L greater trochanter on 03/29/2022 2:26 PM Indications: pain and diagnostic evaluation Details: 25 G 1.5 in needle, lateral approach  Arthrogram: No  Medications: 2 mL lidocaine 1 %; 80 mg methylPREDNISolone acetate 40 MG/ML; 2 mL bupivacaine 0.25 % Outcome: tolerated well, no immediate complications Procedure, treatment alternatives, risks and benefits explained, specific risks discussed. Consent was given by the patient.    Clinical Data: No additional findings.  ROS:  All other systems negative, except as noted in the HPI. Review of Systems  Objective: Vital Signs: There were no vitals taken for this visit.  Specialty Comments:   MRI CERVICAL SPINE WITHOUT CONTRAST   TECHNIQUE: Multiplanar, multisequence MR imaging of the cervical spine was performed. No intravenous contrast was administered.   COMPARISON:  X-ray cervical 04/05/2021. CT cervical spine November 30, 2011.   FINDINGS: Alignment: 3 mm anterolisthesis of C4 over C5 and 4 mm anterolisthesis of C7 over T1. Small retrolisthesis of C5 over C6.   Vertebrae: No fracture, evidence of discitis, or bone lesion. Endplate degenerative changes at C5-6 and C6-7.   Cord: Mass effect on the cord at C5-6 and C6-7 with increase of the T2 signal within the cord at C5-6.   Posterior Fossa, vertebral arteries, paraspinal tissues: Negative.   Disc levels:   C2-3: Facet degenerative changes. No spinal canal or neural foraminal stenosis.   C3-4: Posterior disc osteophyte complex resulting in mild spinal canal stenosis. Uncovertebral and facet degenerative changes with prominent hypertrophy of the right facet joint resulting in severe right neural foraminal narrowing.   C4-5: Posterior disc osteophyte complex resulting in mild spinal canal stenosis. Uncovertebral and facet degenerative changes. Prominent hypertrophy of the right facet joint resulting in severe bilateral neural foraminal narrowing.   C5-6: Posterior disc osteophyte complex resulting in moderate to severe spinal canal stenosis with mass effect on the cord and increased T2 signal within the cord suggesting cord edema versus myelomalacia. Uncovertebral and facet degenerative changes resulting in severe bilateral neural foraminal narrowing.   C6-7: Posterior disc osteophyte complex resulting in moderate spinal canal stenosis with mild mass effect on the cord. Uncovertebral and facet degenerative changes resulting in severe bilateral neural foraminal narrowing.   C7-T1: Anterolisthesis, disc bulge/disc uncovering and hypertrophic facet degenerative changes resulting in moderate bilateral  neural foraminal narrowing. No significant spinal canal stenosis.   IMPRESSION: 1. Advanced degenerative changes of the cervical spine, worst at C5-6 where there is moderate to severe spinal canal stenosis with mass effect on the cord and increased cord signal on T2, suggesting cord edema versus myelomalacia. 2. Moderate spinal canal stenosis at C6-7. 3. Severe bilateral neural foraminal narrowing at C4-5, C5-6 and C6-7 and on the right at C3-4. 4. Moderate bilateral neural foraminal narrowing at C7-T1.     Electronically Signed   By: Pedro Earls M.D.   On: 05/10/2021 09:52  PMFS History: Patient Active Problem List   Diagnosis Date Noted  . Bronchitis with acute wheezing 03/03/2022  . Acute cough 02/18/2022  . Acute bronchitis 01/26/2022  . Acute gingivitis 11/05/2021  . Joint pain 11/05/2021  . Cervical spondylosis with myelopathy and radiculopathy 09/03/2021  . Sacroiliitis ([place]) 07/13/2021  . Hyperglycemia 03/04/2021  .  Urethral stricture 01/21/2021  . S/P CABG x 4 01/19/2021  . Other forms of angina pectoris 12/24/2020  . Hypertension 12/08/2020  . Paroxysmal supraventricular tachycardia 11/26/2020  . Lower respiratory tract infection due to COVID-19 virus 09/21/2020  . Polycythemia 06/22/2020  . Chronic arthralgias of knees and hips 05/19/2020  . Myalgia due to statin 05/19/2020  . Bronchopneumonia 02/03/2020  . Bursitis of left shoulder 11/28/2019  . ED (erectile dysfunction) 10/22/2019  . Sciatica without back pain, left 10/22/2019  . History of total left hip replacement 10/10/2019  . Primary insomnia 07/05/2019  . Anxiety   . Allergic rhinitis 05/16/2019  . CAD (coronary artery disease) 01/18/2019  . H/O heart artery stent 01/18/2019  . Sigmoid stricture ([place]) 04/13/2018  . Osteoarthritis of left hip 11/10/2015  . Status post left hip replacement 11/10/2015  . Diverticulosis 09/24/2014  . Prostate cancer ([place]) 09/24/2014  . Chronic back  pain 09/24/2014  . Dyspnea 06/06/2014  . Murmur 06/06/2014   Past Medical History:  Diagnosis Date  . Anxiety   . Arthritis   . Coronary artery disease    s/p prior stenting to RCA. Last cath in 05/2019 showed in-stent CTO of RCA and CTO of LCX (medical therpay recommended)  . Diverticulosis   . Dyspnea   . Hard of hearing   . Hyperlipidemia   . Hypertension    hx of elevation on lyrica, off med and now normal   . Paroxysmal SVT (supraventricular tachycardia)   . Prostate cancer ([place])   . Wears glasses     Family History  Problem Relation Age of Onset  . Alzheimer's disease Mother   . Stroke Father   . Diabetes Sister   . Heart attack Maternal Grandmother   . Cancer Maternal Uncle        prostate  . Prostate cancer Maternal Uncle   . Cancer Maternal Uncle        prostate  . Prostate cancer Maternal Uncle   . Colon polyps Brother   . Colon cancer Neg Hx   . Esophageal cancer Neg Hx   . Rectal cancer Neg Hx   . Stomach cancer Neg Hx     Past Surgical History:  Procedure Laterality Date  . ANTERIOR CERVICAL DECOMP/DISCECTOMY FUSION N/A 09/03/2021   Procedure: CERVICAL FIVE-SIX, CERVICAL SIX-SEVEN ANTERIOR CERVICAL DECOMPRESSION/DISCECTOMY FUSION;  Surgeon: Earnie Larsson, MD;  Location: [place];  Service: Neurosurgery;  Laterality: N/A;  . CARDIAC CATHETERIZATION     with 2 stents   . COLECTOMY  05/2018  . COLONOSCOPY    . CORONARY ARTERY BYPASS GRAFT N/A 01/19/2021   Procedure: CORONARY ARTERY BYPASS GRAFTING (CABG), ON PUMP TIMES FOUR, USING LEFT INTERNAL MAMMARY ARTERY AND ENDOSOCPICALLY HARVESTED RIGHT GREATER SAPHENOUS VEIN;  Surgeon: Lajuana Matte, MD;  Location: [place];  Service: Open Heart Surgery;  Laterality: N/A;  . CYSTOSCOPY WITH URETHRAL DILATATION N/A 01/14/2021   Procedure: CYSTOSCOPY WITH BALLOON  DILATATION;  Surgeon: Bjorn Loser, MD;  [facility];  Service: Urology;  Laterality: N/A;  . EYE SURGERY  02/2020   bilaterl cataracts  . IR  THORACENTESIS ASP PLEURAL SPACE W/IMG GUIDE  02/02/2021  . LEFT HEART CATH AND CORONARY ANGIOGRAPHY N/A 12/24/2020   Procedure: LEFT HEART CATH AND CORONARY ANGIOGRAPHY;  Surgeon: Martinique, Peter M, MD;  Location: [facility];  Service: Cardiovascular;  Laterality: N/A;  . PROSTATECTOMY    . TEE WITHOUT CARDIOVERSION N/A 01/19/2021   Procedure: TRANSESOPHAGEAL ECHOCARDIOGRAM (TEE);  Surgeon: [name],  Lucile Crater, MD;  Location: [facility];  Service: Open Heart Surgery;  Laterality: N/A;  . TOTAL HIP ARTHROPLASTY Left 11/10/2015   Procedure: LEFT TOTAL HIP ARTHROPLASTY ANTERIOR APPROACH;  Surgeon: Mcarthur Rossetti, MD;  Location: [facility];  Service: Orthopedics;  Laterality: Left;   Social History   Occupational History  . Occupation: Employed at [employer]  Tobacco Use  . Smoking status: Never  . Smokeless tobacco: Never  Vaping Use  . Vaping Use: Never used  Substance and Sexual Activity  . Alcohol use: Not Currently    Alcohol/week: 0.0 standard drinks of alcohol    Comment: rarely  . Drug use: No  . Sexual activity: Yes    Partners: Female    Birth control/protection: None

## 2022-03-30 NOTE — Progress Notes (Signed)
Cardiology Office Note:    Date:  04/04/2022   ID:  Jacob Collier, DOB 1955-06-30, MRN [id number]  PCP:  [name], MD (Inactive)  Cardiologist:  [name], MD  Electrophysiologist:  None   Referring MD: No ref. provider found   Chief Complaint: elevated heart rate/ palpitations  History of Present Illness:    Jacob Collier is a 67 y.o. male with a history of CAD s/p DES to RCA in 01/2019 and then CABG x4 (LIMA to LAD, reverse SVG to PDA, reverse SVG to OM1, and reverse SVG to 1st Diag) in 01/2021, left pleural effusion following CABG s/p thoracentesis, post-op atrial fibrillation not on anticoagulation, paroxysmal SVT noted on monitor in 07/2019, hypertension, hyperlipidemia intolerant to statins, anxiety, prior prostate cancer, and polycythemia who is followed by Dr. [name] and presents today for evaluation of elevated heart rate/ palpitations.   Patient has a known history of CAD and was previously followed by Cardiology at [facility] and now follows with Dr. [name]. Reviewed records in [place]. Cardiac catheterization in 01/2019 at [facility] showed CTO of LCX and RCA. Patient underwent successful PCI with DES to proximal to mid RCA lesion. Zio monitor was ordered in 02/2019 which showed frequent PVC (8.5% burden) and non-sustained VT. Myoview was therefore ordered to rule out ischemia as the cause. This was done in 05/2019 showed inferolateral wall defect with associated wall motion abnormality compatible with prior MI. He was started on Ranexa but was unable to tolerate this. [facility] that month with chest pain. Echo showed LVEF of 55-60% with akinesis of the basal inferior and inferoseptal wall and hypokinesis of inferolateral walls. He underwent repeat cardiac catheterization which showed instent CTO of RCA. Medical therapy was recommended with consideration for CABG if unsatisfactory response with optimization of  antianginals.    He presented to [facility] ED in 06/2019 with palpitations and tightness in his neck and was found to be in SVT with rates in the 200s but converted back to sinus rhythm on his own. He was started on Toprol-XL and Event Monitor was ordered which showed underlying sinus rhythm with average heart rate of 65 bpm and occasional PVCs/ventricular couplets, one 4 beat episodes of non-sustained VT, and a 36 second episode of SVT with rates in the 170s. There were no pauses or evidence of atrial fibrillation. Toprol-XL was increased.   At an office visit with me in 12/2020, patient reported recurrent chest pain requiring multiple doses of Nitro. Cardiac catheterization was recommended and showed severe 3 vessel CAD with a new 75% mid LAD lesion compared to prior cath in 2021. Patient was referred to CT surgery for consideratio of CABG. Echo showed LVEF of 55% with hypokinesis of antero-lateral wall, posterior wall, and basal inferior segment as well as grade 2 diastolic dysfunction, mild MR, and mild dilatation of the ascending aorta measuring 41 mm. Patient ultimately underwent CABG x4 with LIMA to LAD, reverse SVG to PDA, reverse SVG to OM1, and reverse SVG to 1st Diag with Dr. [name] on 01/19/2021. Urology placed a foley catheter prior to surgery given history of bulbar urethral stricture and this was kept throughout hospitalization and at time of discharge. He did develop post-op atrial fibrillation and was started on IV Amiodarone and converted back to sinus rhythm. He was discharged on PO Amiodarone but was not started on anticoagulation. Post-op course also complicated by bladder spasms from indwelling foley which was treated with  Oxybutin and phlebitis due to IV infiltration of Amiodarone which was treated with Keflex. Following discharge, he also required thoracentesis on 02/02/2021 for left pleural effusion. Thankfully, since CABG he has had significant improvement and has been able to go back to  working over 40 hours per week.  Patient was seen by me in 11/2021 for evaluation of lower extremity edema that is worse at the end of the day. He denied any other CHF symptoms at that time and had not had any recurrent chest pain. He had increased his Amlodipine to '5mg'$  three times daily on his own for additional BP control. Edema was felt to likely be due to high dose Amlodipine. Therefore, he was advised to decrease this to '5mg'$  once daily and was started on HCTZ 12.'5mg'$  daily. He was last seen by Dr. [name] in 02/2022 at which time he was doing well from a cardiac standpoint. He was taking Amlodipine '5mg'$  in the morning and 2.'5mg'$  in the evening at that time and was not taking HCTZ (unable to tolerate this due to multiple side effects). BP was well controlled on this and he was tolerating in well so he was continued on this as well as low dose Toprol-XL.  Patient called our office on 04/03/2022 with concerns of elevated heart rate over the last month. Therefore, this visit was scheduled for further evaluation. Patient reports he had RSV around Thanksgiving and then [place] around Christmas. He has recovered from these respiratory illnesses but since then has had more frequent episodes of "tachycardia." He states he can feel these episodes coming. First, his surroundings will go slightly dim and then he will develop a very mild chest pressure that feels like his heart is starting to increase. He will then place a pulse oximeter on his finger and will notice that his heart rates will increase to the 140s and his O2 sat will drop to the low 90s. This will last for typically 2-5 minutes and then heart rate will drop back down to the 70s and O2 sat will go back up to 97%. He states he will feel fatigue for a while after this. This occurs multiple times a week.He denies any other chest pain and nothing that feels like his prior angina. He also denies any shortness of breath, orthopnea, PND, lightheadedness, dizziness, or  syncope. Lower extremity edema has improved.   His PCP recently recommended taking Diltiazem which he had an old prescription of so he took a dose of Diltiazem '60mg'$  this morning but has felt more sluggish after taking this. EKG in the office today shows normal sinus rhythm with rate of 60 bpm.   He states he normally takes Amlodipine '5mg'$ , Toprol-XL 12.'5mg'$  daily, Aspirin '81mg'$ , and Zetia '10mg'$  in the morning and then Amlodipine 2.'5mg'$  and Plavix '75mg'$  in the evening. However, this morning he only took Amlodipine 2.'5mg'$  and the Diltiazem '60mg'$  (as well as the Aspirin and Zetia). BP borderline elevated in the office at 136/78.  Past Medical History:  Diagnosis Date   Anxiety    Arthritis    Coronary artery disease    a. S/p DES to RCA in 01/2019 b. s/p CABG x4 (LIMA-LAD, reverse SVG-PDA, reverse SVG-OM1, reverse SVG-1st Diag) in 01/2021   Diverticulosis    Dyspnea    Hard of hearing    Hyperlipidemia    Hypertension    hx of elevation on lyrica, off med and now normal    Paroxysmal SVT (supraventricular tachycardia)    Post-op atrial fibrillation  Prostate cancer [facility])    Wears glasses     Past Surgical History:  Procedure Laterality Date   ANTERIOR CERVICAL DECOMP/DISCECTOMY FUSION N/A 09/03/2021   Procedure: CERVICAL FIVE-SIX, CERVICAL SIX-SEVEN ANTERIOR CERVICAL DECOMPRESSION/DISCECTOMY FUSION;  Surgeon: Earnie Larsson, MD;  Location: [place];  Service: Neurosurgery;  Laterality: N/A;   CARDIAC CATHETERIZATION     with 2 stents    COLECTOMY  05/2018   COLONOSCOPY     CORONARY ARTERY BYPASS GRAFT N/A 01/19/2021   Procedure: CORONARY ARTERY BYPASS GRAFTING (CABG), ON PUMP TIMES FOUR, USING LEFT INTERNAL MAMMARY ARTERY AND ENDOSOCPICALLY HARVESTED RIGHT GREATER SAPHENOUS VEIN;  Surgeon: Lajuana Matte, MD;  Location: [place];  Service: Open Heart Surgery;  Laterality: N/A;   CYSTOSCOPY WITH URETHRAL DILATATION N/A 01/14/2021   Procedure: CYSTOSCOPY WITH BALLOON  DILATATION;  Surgeon:  Bjorn Loser, MD;  Location: [facility];  Service: Urology;  Laterality: N/A;   EYE SURGERY  02/2020   bilaterl cataracts   IR THORACENTESIS ASP PLEURAL SPACE W/IMG GUIDE  02/02/2021   LEFT HEART CATH AND CORONARY ANGIOGRAPHY N/A 12/24/2020   Procedure: LEFT HEART CATH AND CORONARY ANGIOGRAPHY;  Surgeon: Martinique, Peter M, MD;  Location: [place] [facility];  Service: Cardiovascular;  Laterality: N/A;   PROSTATECTOMY     TEE WITHOUT CARDIOVERSION N/A 01/19/2021   Procedure: TRANSESOPHAGEAL ECHOCARDIOGRAM (TEE);  Surgeon: Lajuana Matte, MD;  Location: [place];  Service: Open Heart Surgery;  Laterality: N/A;   TOTAL HIP ARTHROPLASTY Left 11/10/2015   Procedure: LEFT TOTAL HIP ARTHROPLASTY ANTERIOR APPROACH;  Surgeon: Mcarthur Rossetti, MD;  Location: [place];  Service: Orthopedics;  Laterality: Left;    Current Medications: Current Meds  Medication Sig   acetaminophen (TYLENOL) 500 MG tablet Take 1,000 mg by mouth every 8 (eight) hours as needed for mild pain.   aspirin EC 81 MG tablet Take 81 mg by mouth daily. Swallow whole.   clonazePAM (KLONOPIN) 1 MG tablet TAKE 1 TABLET BY MOUTH TWICE A DAY AS NEEDED FOR ANXIETY   clopidogrel (PLAVIX) 75 MG tablet TAKE 1 TABLET (75 MG TOTAL) BY MOUTH DAILY. STOP TAKING PLAVIX ON SATURDAY NOV. 5, 2022   Eszopiclone 3 MG TABS TAKE 1 TABLET BY MOUTH EVERYDAY AT BEDTIME   Evolocumab (REPATHA SURECLICK) 703 MG/ML SOAJ INJECT 140 MG INTO THE SKIN EVERY 14 (FOURTEEN) DAYS.   ezetimibe (ZETIA) 10 MG tablet TAKE 1 TABLET BY MOUTH EVERY DAY   metoprolol succinate (TOPROL-XL) 25 MG 24 hr tablet TAKE 0.5 TABLET BY MOUTH DAILY. TAKE WITH OR IMMEDIATELY FOLLOWING A MEAL.   Omega-3 Fatty Acids (OMEGA 3 PO) Take 1 capsule by mouth 2 (two) times daily.   promethazine-dextromethorphan (PROMETHAZINE-DM) 6.25-15 MG/5ML syrup Take 5 mLs by mouth 4 (four) times daily as needed.     Allergies:   Ranolazine, Atorvastatin, Pravastatin, Rosuvastatin, and Statins    Social History   Socioeconomic History   Marital status: Married    Spouse name: Not on file   Number of children: 0   Years of education: Not on file   Highest education level: Not on file  Occupational History   Occupation: Employed at [employer]  Tobacco Use   Smoking status: Never   Smokeless tobacco: Never  Vaping Use   Vaping Use: Never used  Substance and Sexual Activity   Alcohol use: Not Currently    Alcohol/week: 0.0 standard drinks of alcohol    Comment: rarely   Drug use: No   Sexual activity: Yes  Partners: Female    Birth control/protection: None  Other Topics Concern   Not on file  Social History Narrative   Not on file   Social Determinants of Health   Financial Resource Strain: Not on file  Food Insecurity: Not on file  Transportation Needs: Not on file  Physical Activity: Not on file  Stress: Not on file  Social Connections: Not on file     Family History: The patient's family history includes Alzheimer's disease in his mother; Cancer in his maternal uncle and maternal uncle; Colon polyps in his brother; Diabetes in his sister; Heart attack in his maternal grandmother; Prostate cancer in his maternal uncle and maternal uncle; Stroke in his father. There is no history of Colon cancer, Esophageal cancer, Rectal cancer, or Stomach cancer.  ROS:   Please see the history of present illness.     EKGs/Labs/Other Studies Reviewed:    The following studies were reviewed:  Event Monitor 07/15/2019 to 08/13/2019: The patient was monitored for 30 days from Jul 15, 2019 through August 13, 2019.  The predominant rhythm was sinus rhythm with an average rate at 65 bpm.  The slowest heart rate was sinus bradycardia at 42 bpm which occurred on May 16 at 10:40 AM.  The fastest heart rate was sinus tachycardia at 164 bpm which occurred on May 24 at 1:32 PM.  The patient had occasional PVCs with a ventricular couplet, as well as several episodes of ventricular  bigeminal rhythm.  There was an episode of 4 beats of nonsustained ventricular tachycardia on Jul 18, 2019 at 6:58 PM at 192 bpm.  There was a 36-second episode of supraventricular tachycardia with PACs at 174 bpm.  There were no pauses.  There was no episodes of atrial fibrillation. _______________   Left Cardiac Catheterization 12/24/2020:   Prox LAD to Mid LAD lesion is 75% stenosed.   1st Diag lesion is 80% stenosed.   Prox Cx to Mid Cx lesion is 100% stenosed.   Prox RCA to Mid RCA lesion is 100% stenosed.   The left ventricular systolic function is normal.   LV end diastolic pressure is mildly elevated.   The left ventricular ejection fraction is 50-55% by visual estimate.   There is no aortic valve stenosis.   3 vessel obstructive CAD. 75% mid LAD, 80% first diagonal, 100% mid LCx and 100% proximal RCA. Compared to prior cath in March 2021 the LAD stenosis is new.  Mild LV dysfunction with inferobasal HK. EF 50% Mildly elevated LVEDP   Plan: needs CT surgery evaluation for CABG. Will hold Plavix. Plan to refer to Pharm D for PCSK 9 inhibitor.    Diagnostic Dominance: Right  _______________   Echocardiogram 01/15/2021: Impressions: 1. Left ventricular ejection fraction, by estimation, is 55%. The left  ventricle has normal function. The left ventricle demonstrates regional  wall motion abnormalities (see scoring diagram/findings for description).  There is mild left ventricular  hypertrophy. Left ventricular diastolic parameters are consistent with  Grade II diastolic dysfunction (pseudonormalization).   2. Right ventricular systolic function is normal. The right ventricular  size is normal.   3. Left atrial size was mildly dilated.   4. The mitral valve is grossly normal. Mild mitral valve regurgitation.  No evidence of mitral stenosis.   5. The aortic valve is tricuspid. There is mild calcification of the  aortic valve. Aortic valve regurgitation is not visualized. No  aortic  stenosis is present.   6. Aortic dilatation noted. There is  mild dilatation of the ascending  aorta, measuring 41 mm.   7. The inferior vena cava is normal in size with greater than 50%  respiratory variability, suggesting right atrial pressure of 3 mmHg.  _______________   Pre-CABG Dopplers 01/15/2021: Summary:  - Right Carotid: Velocities in the right ICA are consistent with a 1-39%  stenosis.  - Left Carotid: Velocities in the left ICA are consistent with a 1-39%  stenosis.  - Vertebrals: Bilateral vertebral arteries demonstrate antegrade flow.   - Right Upper Extremity: Doppler waveforms remain within normal limits with right radial compression. Doppler waveforms remain within normal limits with right ulnar compression.  - Left Upper Extremity: Doppler waveforms remain within normal limits with left radial compression. Doppler waveforms decrease 50% with left ulnar compression.   EKG:  EKG ordered today. EKG personally reviewed and demonstrates normal sinus rhythm, rate 60 bpm, with non-specific T wave flattening in leads I and aVL. Normal axis. Normal PR and QRS intervals. QTc 436 ms.  Recent Labs: 07/05/2021: TSH 2.450 11/11/2021: Hemoglobin 16.4; Platelets 212 12/29/2021: ALT 31; BUN 19; Creatinine, Ser 1.25; Potassium 4.3; Sodium 140  Recent Lipid Panel    Component Value Date/Time   CHOL 75 (L) 07/05/2021 0855   TRIG 94 07/05/2021 0855   HDL 36 (L) 07/05/2021 0855   CHOLHDL 2.1 07/05/2021 0855   CHOLHDL 4.8 06/06/2019 0612   VLDL 29 06/06/2019 0612   LDLCALC 20 07/05/2021 0855    Physical Exam:    Vital Signs: BP 136/78 (BP Location: Right Arm, Patient Position: Sitting, Cuff Size: Large)   Pulse 60   Ht '5\' 11"'$  (1.803 m)   Wt 229 lb (103.9 kg)   BMI 31.94 kg/m     Wt Readings from Last 3 Encounters:  04/04/22 229 lb (103.9 kg)  03/03/22 223 lb (101.2 kg)  02/07/22 228 lb (103.4 kg)     General: 67 y.o. Caucasian male in no acute distress. HEENT:  Normocephalic and atraumatic. Sclera clear.  Neck: Supple. No carotid bruits. No JVD. Heart: RRR. Distinct S1 and S2. No murmurs, gallops, or rubs. Lungs: No increased work of breathing. Clear to ausculation bilaterally. No wheezes, rhonchi, or rales.  Abdomen: Soft, non-distended, and non-tender to palpation.  Extremities: No lower extremity edema.    Skin: Warm and dry. Neuro: Alert and oriented x3. No focal deficits. Psych: Normal affect. Responds appropriately.  Assessment:    1. Palpitations   2. Paroxysmal SVT (supraventricular tachycardia)   3. History of bradycardia   4. Post-op atrial fibrillation   5. Coronary artery disease involving native coronary artery of native heart without angina pectoris   6. Hypertension, unspecified type   7. Hyperlipidemia, unspecified hyperlipidemia type     Plan:    Palpitations  Paroxysmal SVT History of Bradycardia Monitor in May/June 2021 showed underlying sinus rhythm with average heart rate of 65 bpm and occasional PVCs/ventricular couplets, one 4 beat episodes of non-sustained VT, and a 36 second episode of SVT with rates in the 170s. He has also had some bradycardia with rates in the 40s in the past which has limited up-titration of beta-blocker.  - Patient has been having recurrent episodes of tachycardia lasting for about 2-5 minutes. This occurs multiple times a week and patient will feel fatigued afterwards. - EKG today shows normal sinus rhythm with rate of 60 bpm.   - Continue Toprol-XL 12.'5mg'$  daily. Previously unable to tolerate high doses of this. - PCP recommended Diltiazem. He took Diltiazem '60mg'$  this  morning and has felt more sluggish today. He is also on Amlodipine. Discussed that Amlodipine and Diltiazem are both calcium channel blockers so would recommend that he not take both. Discussed stopping Diltiazem and continuing current medications (Amlodipine and Toprol-XL) vs stopping Amlodipine and continuing diltiazem (however,  if we did this I think we would also need an additional medication to help control BP). Patient has been very sensitivity to medications in the past. Per shared decision making, will stop Diltiazem and continue Toprol-XL alone for rate control.  - Suspect these episode may be paroxysmal SVT but also want to make sure he has not had any recurrent atrial fibrillation. Will order 2 week Zio monitor for further evaluation. If monitor confirms SVT, will plan to refer patient to EP for consideration of ablation. Will also order BMET, Mag, and TSH.  Post Atrial fibrillation Following CABG. No documented recurrence so not on anticoagulation. No longer on Amiodarone. - Maintaining sinus rhythm. - Continue Toprol-XL 12.'5mg'$  daily. - If recurrent atrial fibrillation is noted on monitor, will need to start Eliquis.  CAD s/p CABG History of CAD s/p DES  to CTO of RCA in 01/2021 and then repeat DES for in-stent restenosis in 05/2019. He had chronic angina following this and ultimately underwent CABG x4 LIMA to LAD, reverse SVG to PDA, reverse SVG to OM1, and reverse SVG to 1st Diag in 01/2021. - Doing well. He has some mild chest pressure associated with the episodes of tachycardia but no other chest pain. Nothing that feels like his prior angina. - Continue Toprol-XL 12.'5mg'$  daily. - Continue DAPT with Aspirin and  Plavix. - Intolerant to multiple statins. Continue Zetia and Repatha.  Hypertension BP borderline elevated; however, patient took his medications differently this morning (please see HPI for details).  - Continue Amlodipine '5mg'$  in the morning and 2.'5mg'$  in the evening. This regimen seems to be working well for him. - Continue Toprol-XL 12.'5mg'$  daily.   Hyperlipidemia Lipid panel on 07/05/2021: Total Cholesterol 75, Triglycerides 94, HDL 36, LDL 20. LDL goal <55.  - Intolerant to multiple statins in the past including Lipitor, Crestor, and Pravastatin.  - Continue Zetia '10mg'$  daily and Repatha.    Disposition: Patient already has a follow-up visit with Dr. [name] in 08/2022.    Medication Adjustments/Labs and Tests Ordered: Current medicines are reviewed at length with the patient today.  Concerns regarding medicines are outlined above.  Orders Placed This Encounter  Procedures   Basic metabolic panel   Magnesium   TSH   LONG TERM MONITOR (3-14 DAYS)   EKG 12-Lead   No orders of the defined types were placed in this encounter.   Patient Instructions  Medication Instructions:  No changes *If you need a refill on your cardiac medications before your next appointment, please call your pharmacy*   Lab Work: Today- BMET, Mag, TSH If you have labs (blood work) drawn today and your tests are completely normal, you will receive your results only by: [place] (if you have MyChart) OR A paper copy in the mail If you have any lab test that is abnormal or we need to change your treatment, we will call you to review the results.   Testing/Procedures: Your physician has recommended that you wear a 14 DAY ZIO-PATCH monitor. The Zio patch cardiac monitor continuously records heart rhythm data for up to 14 days, this is for patients being evaluated for multiple types heart rhythms. For the first 24 hours post application, please avoid getting the  Zio monitor wet in the shower or by excessive sweating during exercise. After that, feel free to carry on with regular activities. Keep soaps and lotions away from the ZIO XT Patch.  This will be mailed to you, please expect 7-10 days to receive.    Applying the monitor   Shave hair from upper left chest.   Hold abrader disc by orange tab.  Rub abrader in 40 strokes over left upper chest as indicated in your monitor instructions.   Clean area with 4 enclosed alcohol pads .  Use all pads to assure area is cleaned thoroughly.  Let dry.   Apply patch as indicated in monitor instructions.  Patch will be placed under collarbone on left  side of chest with arrow pointing upward.   Rub patch adhesive wings for 2 minutes.Remove white label marked "1".  Remove white label marked "2".  Rub patch adhesive wings for 2 additional minutes.   While looking in a mirror, press and release button in center of patch.  A small green light will flash 3-4 times .  This will be your only indicator the monitor has been turned on.     Do not shower for the first 24 hours.  You may shower after the first 24 hours.   Press button if you feel a symptom. You will hear a small click.  Record Date, Time and Symptom in the Patient Log Book.   When you are ready to remove patch, follow instructions on last 2 pages of Patient Log Book.  Stick patch monitor onto last page of Patient Log Book.   Place Patient Log Book in [place] box.  Use locking tab on box and tape box closed securely.  The [employer] has [employer] on it.  Please place in mailbox as soon as possible.  Your physician should have your test results approximately 7 days after the monitor has been mailed back to [facility].   Call [place] at [phone] if you have questions regarding your ZIO XT patch monitor.  Call them immediately if you see an orange light blinking on your monitor.   If your monitor falls off in less than 4 days contact our Monitor department at [phone].  If your monitor becomes loose or falls off after 4 days call [employer] at [phone] for suggestions on securing your monitor    Follow-Up: At [facility], you and your health needs are our priority.  As part of our continuing mission to provide you with exceptional heart care, we have created designated Provider Care Teams.  These Care Teams include your primary Cardiologist (physician) and Advanced Practice Providers (APPs -  Physician Assistants and Nurse Practitioners) who all work together to provide you with the care you need, when you need it.  We recommend  signing up for the patient portal called "MyChart".  Sign up information is provided on this After Visit Summary.  MyChart is used to connect with patients for Virtual Visits (Telemedicine).  Patients are able to view lab/test results, encounter notes, upcoming appointments, etc.  Non-urgent messages can be sent to your provider as well.   To learn more about what you can do with MyChart, go to [email or web address].    Your next appointment:   08/19/22 at [facility] with Dr [name]     Signed, Darreld Mclean, PA-C  04/04/2022 4:53 PM    [place]

## 2022-03-30 NOTE — Telephone Encounter (Signed)
Thanks for the heads up.

## 2022-03-30 NOTE — Telephone Encounter (Signed)
PT HAS SCHEDULED APPT 04/04/2022 at 2:45 PM

## 2022-03-31 ENCOUNTER — Ambulatory Visit: Payer: Managed Care, Other (non HMO) | Admitting: Family Medicine

## 2022-04-01 ENCOUNTER — Telehealth: Payer: Self-pay | Admitting: *Deleted

## 2022-04-01 ENCOUNTER — Other Ambulatory Visit: Payer: Self-pay

## 2022-04-01 DIAGNOSIS — I25110 Atherosclerotic heart disease of native coronary artery with unstable angina pectoris: Secondary | ICD-10-CM

## 2022-04-01 MED ORDER — CLONAZEPAM 1 MG PO TABS: ORAL_TABLET | ORAL | 3 refills | Status: DC

## 2022-04-01 NOTE — Telephone Encounter (Signed)
   Pre-operative Risk Assessment    Patient Name: Jacob Collier  DOB: 08/07/1955 MRN: [id number]      Request for Surgical Clearance    Procedure:   1 DENTAL IMPLANT PLACEMENT TO BE DONE  Date of Surgery:  Clearance TBD                                 Surgeon:  DR. Dione Housekeeper, DDS Surgeon's Group or Practice Name:  [facility] Phone number:  [phone] Fax number:  [phone]   Type of Clearance Requested:   - Medical  - Pharmacy:  Hold Aspirin and Clopidogrel (Plavix)     Type of Anesthesia:   CONSCIOUS SEDATION    Additional requests/questions:    Jiles Prows   04/01/2022, 6:11 PM

## 2022-04-04 ENCOUNTER — Ambulatory Visit: Payer: Managed Care, Other (non HMO) | Attending: Student | Admitting: Student

## 2022-04-04 ENCOUNTER — Encounter: Payer: Self-pay | Admitting: Student

## 2022-04-04 ENCOUNTER — Ambulatory Visit (INDEPENDENT_AMBULATORY_CARE_PROVIDER_SITE_OTHER): Payer: Managed Care, Other (non HMO)

## 2022-04-04 VITALS — BP 136/78 | HR 60 | Ht 71.0 in | Wt 229.0 lb

## 2022-04-04 DIAGNOSIS — I471 Supraventricular tachycardia, unspecified: Secondary | ICD-10-CM | POA: Diagnosis not present

## 2022-04-04 DIAGNOSIS — I480 Paroxysmal atrial fibrillation: Secondary | ICD-10-CM

## 2022-04-04 DIAGNOSIS — R002 Palpitations: Secondary | ICD-10-CM | POA: Diagnosis not present

## 2022-04-04 DIAGNOSIS — Z87898 Personal history of other specified conditions: Secondary | ICD-10-CM

## 2022-04-04 DIAGNOSIS — I2510 Atherosclerotic heart disease of native coronary artery without angina pectoris: Secondary | ICD-10-CM | POA: Diagnosis not present

## 2022-04-04 DIAGNOSIS — E785 Hyperlipidemia, unspecified: Secondary | ICD-10-CM | POA: Insufficient documentation

## 2022-04-04 DIAGNOSIS — I10 Essential (primary) hypertension: Secondary | ICD-10-CM

## 2022-04-04 NOTE — Patient Instructions (Addendum)
Medication Instructions:  No changes *If you need a refill on your cardiac medications before your next appointment, please call your pharmacy*   Lab Work: Today- BMET, Mag, TSH If you have labs (blood work) drawn today and your tests are completely normal, you will receive your results only by: [facility] (if you have MyChart) OR A paper copy in the mail If you have any lab test that is abnormal or we need to change your treatment, we will call you to review the results.   Testing/Procedures: Your physician has recommended that you wear a 14 DAY ZIO-PATCH monitor. The Zio patch cardiac monitor continuously records heart rhythm data for up to 14 days, this is for patients being evaluated for multiple types heart rhythms. For the first 24 hours post application, please avoid getting the Zio monitor wet in the shower or by excessive sweating during exercise. After that, feel free to carry on with regular activities. Keep soaps and lotions away from the ZIO XT Patch.  This will be mailed to you, please expect 7-10 days to receive.    Applying the monitor   Shave hair from upper left chest.   Hold abrader disc by orange tab.  Rub abrader in 40 strokes over left upper chest as indicated in your monitor instructions.   Clean area with 4 enclosed alcohol pads .  Use all pads to assure area is cleaned thoroughly.  Let dry.   Apply patch as indicated in monitor instructions.  Patch will be placed under collarbone on left side of chest with arrow pointing upward.   Rub patch adhesive wings for 2 minutes.Remove white label marked "1".  Remove white label marked "2".  Rub patch adhesive wings for 2 additional minutes.   While looking in a mirror, press and release button in center of patch.  A small green light will flash 3-4 times .  This will be your only indicator the monitor has been turned on.     Do not shower for the first 24 hours.  You may shower after the first 24 hours.   Press  button if you feel a symptom. You will hear a small click.  Record Date, Time and Symptom in the Patient Log Book.   When you are ready to remove patch, follow instructions on last 2 pages of Patient Log Book.  Stick patch monitor onto last page of Patient Log Book.   Place Patient Log Book in [place] box.  Use locking tab on box and tape box closed securely.  The [employer] and [employer] has [employer] on it.  Please place in mailbox as soon as possible.  Your physician should have your test results approximately 7 days after the monitor has been mailed back to [facility]   Call [facility] at [phone] if you have questions regarding your ZIO XT patch monitor.  Call them immediately if you see an orange light blinking on your monitor.   If your monitor falls off in less than 4 days contact our Monitor department at [phone].  If your monitor becomes loose or falls off after 4 days call [employer] at [phone] for suggestions on securing your monitor    Follow-Up: At [facility], you and your health needs are our priority.  As part of our continuing mission to provide you with exceptional heart care, we have created designated Provider Care Teams.  These Care Teams include your primary Cardiologist (physician) and Advanced Practice Providers (APPs -  Physician Assistants and Nurse Practitioners) who all work together to provide you with the care you need, when you need it.  We recommend signing up for the patient portal called "MyChart".  Sign up information is provided on this After Visit Summary.  MyChart is used to connect with patients for Virtual Visits (Telemedicine).  Patients are able to view lab/test results, encounter notes, upcoming appointments, etc.  Non-urgent messages can be sent to your provider as well.   To learn more about what you can do with MyChart, go to [email or web address].    Your next appointment:   08/19/22 at [place] with Dr  [name]

## 2022-04-04 NOTE — Progress Notes (Unsigned)
Enrolled for [employer] to mail a ZIO XT long term holter monitor to the patients address on file.   Dr. [name] to read.

## 2022-04-04 NOTE — Telephone Encounter (Signed)
Patient has an appointment today with Sande Rives PA at 2:45, clearance request can we discussed at this time. Therefore, I am removing this request from the pool.  Trudi Ida, NP

## 2022-04-05 ENCOUNTER — Other Ambulatory Visit (HOSPITAL_COMMUNITY): Payer: Self-pay

## 2022-04-05 LAB — BASIC METABOLIC PANEL
BUN/Creatinine Ratio: 12 (ref 10–24)
BUN: 13 mg/dL (ref 8–27)
CO2: 19 mmol/L — ABNORMAL LOW (ref 20–29)
Calcium: 9.5 mg/dL (ref 8.6–10.2)
Chloride: 104 mmol/L (ref 96–106)
Creatinine, Ser: 1.06 mg/dL (ref 0.76–1.27)
Glucose: 77 mg/dL (ref 70–99)
Potassium: 4.7 mmol/L (ref 3.5–5.2)
Sodium: 140 mmol/L (ref 134–144)
eGFR: 76 mL/min/{1.73_m2} (ref >59)

## 2022-04-05 LAB — MAGNESIUM: Magnesium: 2.2 mg/dL (ref 1.6–2.3)

## 2022-04-05 LAB — TSH: TSH: 1.57 u[IU]/mL (ref 0.450–4.500)

## 2022-04-06 ENCOUNTER — Telehealth: Payer: Self-pay

## 2022-04-06 NOTE — Telephone Encounter (Signed)
Pt c/o medication issue:  1. Name of Medication: Repatha   2. How are you currently taking this medication (dosage and times per day)?   3. Are you having a reaction (difficulty breathing--STAT)?   4. What is your medication issue? Pt is now on a [employer] plan and this auth no longer is valid. [facility] requesting that this Prior Auth be sent again.   [employer] BIN # [id number] PCN : [id number]  ID : [id number]

## 2022-04-06 NOTE — Telephone Encounter (Signed)
Prior auth approved through 03/25/23.

## 2022-04-06 NOTE — Telephone Encounter (Signed)
Patient saw Audrea Muscat Persons, PA on 03/29/22

## 2022-04-06 NOTE — Telephone Encounter (Signed)
Pharmacy Patient Advocate Encounter  Prior Authorization for REPATHA 140 MG/ML INJ has been approved.    PA# [id number] Effective dates: 04/05/22 through 10/04/22  Received notification from [place] that prior authorization for REPATHA 140 MG/ML INJ is needed.    PA submitted on 04/05/22 Key [id number] Status is pending  Karie Soda, [place] Patient Advocate Specialist Direct Number: [phone] Fax: [phone]

## 2022-04-07 ENCOUNTER — Other Ambulatory Visit (HOSPITAL_COMMUNITY): Payer: Self-pay

## 2022-04-07 NOTE — Telephone Encounter (Signed)
Per test claim PA is still approved and valid till 10/04/22. Patient still has active coverage under [employer] insurance [place]: [id number] also has a PA approval on file till 03/25/23 but their cost is more if they bill under [place].

## 2022-04-10 DIAGNOSIS — R002 Palpitations: Secondary | ICD-10-CM | POA: Diagnosis not present

## 2022-04-11 ENCOUNTER — Ambulatory Visit: Payer: Managed Care, Other (non HMO) | Admitting: Physician Assistant

## 2022-04-12 ENCOUNTER — Telehealth: Payer: Self-pay | Admitting: Orthopedic Surgery

## 2022-04-12 NOTE — Telephone Encounter (Signed)
Patient scheduled.

## 2022-04-12 NOTE — Telephone Encounter (Signed)
Jacob Collier wanted to let [name] know that the injection in his bursa only gave him about 2 days of relief.  He would like to know if you can go ahead and order an MRI scan so that he can find out what it is going on.  He can be reached at [phone].

## 2022-04-24 NOTE — Progress Notes (Signed)
Subjective:  Patient ID: Jacob Collier, male    DOB: 23-Oct-1955  Age: 67 y.o. MRN: [id number]  No chief complaint on file.    History of Present illness:  HYPERTENSION / HYPERLIPIDEMIA  Satisfied with current treatment? {Blank single:19197::"yes","no"} Duration of hypertension: {Blank single:19197::"chronic","months","years"} BP monitoring frequency: {Blank single:19197::"not checking","rarely","daily","weekly","monthly","a few times a day","a few times a week","a few times a month"} BP range:  BP medication side effects: {Blank single:19197::"yes","no"} Past BP meds: {Blank A999333 (bystolic)","carvedilol","chlorthalidone","clonidine","diltiazem","exforge HCT","HCTZ","irbesartan (avapro)","labetalol","lisinopril","lisinopril-HCTZ","losartan (cozaar)","methyldopa","nifedipine","olmesartan (benicar)","olmesartan-HCTZ","quinapril","ramipril","spironalactone","tekturna","valsartan","valsartan-HCTZ","verapamil"} Duration of hyperlipidemia: {Blank single:19197::"chronic","months","years"} Cholesterol medication side effects: {Blank single:19197::"yes","no"} Cholesterol supplements: {Blank multiple:19196::"none","fish oil","niacin","red yeast rice"} Past cholesterol medications: {Blank multiple:19196::"none","atorvastain (lipitor)","lovastatin (mevacor)","pravastatin (pravachol)","rosuvastatin (crestor)","simvastatin (zocor)","vytorin","fenofibrate (tricor)","gemfibrozil","ezetimide (zetia)","niaspan","lovaza"} Medication compliance: {Blank single:19197::"excellent compliance","good compliance","fair compliance","poor compliance"} Aspirin: {Blank single:19197::"yes","no"} Recent stressors: {Blank single:19197::"yes","no"} Recurrent headaches: {Blank single:19197::"yes","no"} Visual changes: {Blank single:19197::"yes","no"} Palpitations: {Blank single:19197::"yes","no"} Dyspnea: {Blank  single:19197::"yes","no"} Chest pain: {Blank single:19197::"yes","no"} Lower extremity edema: {Blank single:19197::"yes","no"} Dizzy/lightheaded: {Blank single:19197::"yes","no"}    Current Outpatient Medications on File Prior to Visit  Medication Sig Dispense Refill   acetaminophen (TYLENOL) 500 MG tablet Take 1,000 mg by mouth every 8 (eight) hours as needed for mild pain.     aspirin EC 81 MG tablet Take 81 mg by mouth daily. Swallow whole.     clonazePAM (KLONOPIN) 1 MG tablet TAKE 1 TABLET BY MOUTH TWICE A DAY AS NEEDED FOR ANXIETY 60 tablet 3   clopidogrel (PLAVIX) 75 MG tablet TAKE 1 TABLET (75 MG TOTAL) BY MOUTH DAILY. STOP TAKING PLAVIX ON SATURDAY NOV. 5, 2022 90 tablet 3   Eszopiclone 3 MG TABS TAKE 1 TABLET BY MOUTH EVERYDAY AT BEDTIME 30 tablet 2   Evolocumab (REPATHA SURECLICK) XX123456 MG/ML SOAJ INJECT 140 MG INTO THE SKIN EVERY 14 (FOURTEEN) DAYS. 6 mL 3   ezetimibe (ZETIA) 10 MG tablet TAKE 1 TABLET BY MOUTH EVERY DAY 90 tablet 3   metoprolol succinate (TOPROL-XL) 25 MG 24 hr tablet TAKE 0.5 TABLET BY MOUTH DAILY. TAKE WITH OR IMMEDIATELY FOLLOWING A MEAL. 45 tablet 2   Omega-3 Fatty Acids (OMEGA 3 PO) Take 1 capsule by mouth 2 (two) times daily.     promethazine-dextromethorphan (PROMETHAZINE-DM) 6.25-15 MG/5ML syrup Take 5 mLs by mouth 4 (four) times daily as needed. 118 mL 0   No current facility-administered medications on file prior to visit.   Past Medical History:  Diagnosis Date   Anxiety    Arthritis    Coronary artery disease    a. S/p DES to RCA in 01/2019 b. s/p CABG x4 (LIMA-LAD, reverse SVG-PDA, reverse SVG-OM1, reverse SVG-1st Diag) in 01/2021   Diverticulosis    Dyspnea    Hard of hearing    Hyperlipidemia    Hypertension    hx of elevation on lyrica, off med and now normal    Paroxysmal SVT (supraventricular tachycardia)    Post-op atrial fibrillation    Prostate cancer ([place])    Wears glasses    Past Surgical History:  Procedure Laterality Date    ANTERIOR CERVICAL DECOMP/DISCECTOMY FUSION N/A 09/03/2021   Procedure: CERVICAL FIVE-SIX, CERVICAL SIX-SEVEN ANTERIOR CERVICAL DECOMPRESSION/DISCECTOMY FUSION;  Surgeon: Earnie Larsson, MD;  Location: [facility];  Service: Neurosurgery;  Laterality: N/A;   CARDIAC CATHETERIZATION     with 2 stents    COLECTOMY  05/2018   COLONOSCOPY     CORONARY ARTERY BYPASS GRAFT N/A 01/19/2021   Procedure: CORONARY ARTERY BYPASS GRAFTING (CABG), ON PUMP TIMES FOUR, USING LEFT INTERNAL MAMMARY ARTERY AND ENDOSOCPICALLY HARVESTED RIGHT GREATER SAPHENOUS VEIN;  Surgeon: [name],  Lucile Crater, MD;  Location: [facility];  Service: Open Heart Surgery;  Laterality: N/A;   CYSTOSCOPY WITH URETHRAL DILATATION N/A 01/14/2021   Procedure: CYSTOSCOPY WITH BALLOON  DILATATION;  Surgeon: Bjorn Loser, MD;  Location: [facility];  Service: Urology;  Laterality: N/A;   EYE SURGERY  02/2020   bilaterl cataracts   IR THORACENTESIS ASP PLEURAL SPACE W/IMG GUIDE  02/02/2021   LEFT HEART CATH AND CORONARY ANGIOGRAPHY N/A 12/24/2020   Procedure: LEFT HEART CATH AND CORONARY ANGIOGRAPHY;  Surgeon: Martinique, Peter M, MD;  Location: [facility];  Service: Cardiovascular;  Laterality: N/A;   PROSTATECTOMY     TEE WITHOUT CARDIOVERSION N/A 01/19/2021   Procedure: TRANSESOPHAGEAL ECHOCARDIOGRAM (TEE);  Surgeon: Lajuana Matte, MD;  Location: [facility];  Service: Open Heart Surgery;  Laterality: N/A;   TOTAL HIP ARTHROPLASTY Left 11/10/2015   Procedure: LEFT TOTAL HIP ARTHROPLASTY ANTERIOR APPROACH;  Surgeon: Mcarthur Rossetti, MD;  Location: [facility];  Service: Orthopedics;  Laterality: Left;    Family History  Problem Relation Age of Onset   Alzheimer's disease Mother    Stroke Father    Diabetes Sister    Heart attack Maternal Grandmother    Cancer Maternal Uncle        prostate   Prostate cancer Maternal Uncle    Cancer Maternal Uncle        prostate   Prostate cancer Maternal Uncle    Colon polyps Brother    Colon cancer Neg  Hx    Esophageal cancer Neg Hx    Rectal cancer Neg Hx    Stomach cancer Neg Hx    Social History   Socioeconomic History   Marital status: Married    Spouse name: Not on file   Number of children: 0   Years of education: Not on file   Highest education level: Not on file  Occupational History   Occupation: Employed at [employer]  Tobacco Use   Smoking status: Never   Smokeless tobacco: Never  Vaping Use   Vaping Use: Never used  Substance and Sexual Activity   Alcohol use: Not Currently    Alcohol/week: 0.0 standard drinks of alcohol    Comment: rarely   Drug use: No   Sexual activity: Yes    Partners: Female    Birth control/protection: None  Other Topics Concern   Not on file  Social History Narrative   Not on file   Social Determinants of Health   Financial Resource Strain: Not on file  Food Insecurity: Not on file  Transportation Needs: Not on file  Physical Activity: Not on file  Stress: Not on file  Social Connections: Not on file    Review of Systems   Objective:  There were no vitals taken for this visit.     04/04/2022    2:43 PM 03/03/2022    1:32 PM 02/07/2022   10:36 AM  BP/Weight  Systolic BP XX123456 [id number]  Diastolic BP 78 70 62  Wt. (Lbs) 229 223 228  BMI 31.94 kg/m2 31.1 kg/m2 31.8 kg/m2    Physical Exam  Diabetic Foot Exam - Simple   No data filed        11/20/2020   11:03 AM 10/26/2020    1:52 PM 05/19/2020    1:40 PM 05/01/2019   11:44 AM 06/22/2016    9:13 AM  Depression screen PHQ 2/9  Decreased Interest 0 0 0 0 0  Down, Depressed, Hopeless 0 0  0 0 0  PHQ - 2 Score 0 0 0 0 0       01/23/2021    9:00 PM 01/24/2021    7:30 AM 03/04/2021    8:15 AM 09/03/2021    9:15 AM 09/03/2021    2:40 PM  Fall Risk  Falls in the past year?   0    Was there an injury with Fall?   0    Fall Risk Category Calculator   0    Fall Risk Category (Retired)   Low    (RETIRED) Patient Fall Risk Level Moderate fall risk Moderate fall risk  Low fall risk Moderate fall risk Moderate fall risk  Patient at Risk for Falls Due to   No Fall Risks    Fall risk Follow up   Falls evaluation completed      Lab Results  Component Value Date   WBC 8.0 11/11/2021   HGB 16.4 11/11/2021   HCT 48.6 11/11/2021   PLT 212 11/11/2021   GLUCOSE 77 04/04/2022   CHOL 75 (L) 07/05/2021   TRIG 94 07/05/2021   HDL 36 (L) 07/05/2021   LDLCALC 20 07/05/2021   ALT 31 12/29/2021   AST 28 12/29/2021   NA 140 04/04/2022   K 4.7 04/04/2022   CL 104 04/04/2022   CREATININE 1.06 04/04/2022   BUN 13 04/04/2022   CO2 19 (L) 04/04/2022   TSH 1.570 04/04/2022   INR 1.4 (H) 01/19/2021   HGBA1C 5.8 (H) 03/04/2021      Assessment & Plan:   There are no diagnoses linked to this encounter.   Follow-up: No follow-ups on file.  An After Visit Summary was printed and given to the patient.  I, Neil Crouch have reviewed all documentation for this visit. The documentation on 04/24/22   for the exam, diagnosis, procedures, and orders are all accurate and complete.    Neil Crouch, DNP, [facility] [phone]

## 2022-04-25 ENCOUNTER — Ambulatory Visit (INDEPENDENT_AMBULATORY_CARE_PROVIDER_SITE_OTHER): Payer: Managed Care, Other (non HMO) | Admitting: Nurse Practitioner

## 2022-04-25 ENCOUNTER — Ambulatory Visit (INDEPENDENT_AMBULATORY_CARE_PROVIDER_SITE_OTHER): Payer: Managed Care, Other (non HMO) | Admitting: Orthopaedic Surgery

## 2022-04-25 ENCOUNTER — Other Ambulatory Visit: Payer: Self-pay | Admitting: Nurse Practitioner

## 2022-04-25 ENCOUNTER — Encounter: Payer: Self-pay | Admitting: Nurse Practitioner

## 2022-04-25 VITALS — BP 130/72 | HR 76 | Temp 97.2°F | Resp 16 | Ht 71.0 in | Wt 215.0 lb

## 2022-04-25 DIAGNOSIS — I471 Supraventricular tachycardia, unspecified: Secondary | ICD-10-CM | POA: Diagnosis not present

## 2022-04-25 DIAGNOSIS — N35919 Unspecified urethral stricture, male, unspecified site: Secondary | ICD-10-CM | POA: Diagnosis not present

## 2022-04-25 DIAGNOSIS — M25552 Pain in left hip: Secondary | ICD-10-CM | POA: Diagnosis not present

## 2022-04-25 DIAGNOSIS — E559 Vitamin D deficiency, unspecified: Secondary | ICD-10-CM | POA: Insufficient documentation

## 2022-04-25 DIAGNOSIS — R5383 Other fatigue: Secondary | ICD-10-CM

## 2022-04-25 DIAGNOSIS — F5101 Primary insomnia: Secondary | ICD-10-CM

## 2022-04-25 DIAGNOSIS — I25110 Atherosclerotic heart disease of native coronary artery with unstable angina pectoris: Secondary | ICD-10-CM

## 2022-04-25 DIAGNOSIS — E782 Mixed hyperlipidemia: Secondary | ICD-10-CM

## 2022-04-25 DIAGNOSIS — I10 Essential (primary) hypertension: Secondary | ICD-10-CM | POA: Diagnosis not present

## 2022-04-25 DIAGNOSIS — E785 Hyperlipidemia, unspecified: Secondary | ICD-10-CM

## 2022-04-25 MED ORDER — METHYLPREDNISOLONE 4 MG PO TABS: ORAL_TABLET | ORAL | 0 refills | Status: DC

## 2022-04-25 NOTE — Assessment & Plan Note (Signed)
Vitamin D level will be checked

## 2022-04-25 NOTE — Assessment & Plan Note (Signed)
Continue omega-3 fatty acids two times daily  Nutrition: Stressed importance of moderation in sodium intake, saturated fat and cholesterol, caloric balance, sufficient intake of complex carbohydrates, fiber, calcium and iron.   Exercise: Stressed the importance of regular exercise.
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
## 2022-04-25 NOTE — Patient Instructions (Signed)
Will call you back with your electrolytes result and add some medicines as needed Follow up in 3 months Please follow up with your cardiology and urology too  Fatigue If you have fatigue, you feel tired all the time and have a lack of energy or a lack of motivation. Fatigue may make it difficult to start or complete tasks because of exhaustion. Occasional or mild fatigue is often a normal response to activity or life. However, long-term (chronic) or extreme fatigue may be a symptom of a medical condition such as: Depression. Not having enough red blood cells or hemoglobin in the blood (anemia). A problem with a small gland located in the lower front part of the neck (thyroid disorder). Rheumatologic conditions. These are problems related to the body's defense system (immune system). Infections, especially certain viral infections. Fatigue can also lead to negative health outcomes over time. Follow these instructions at home: Medicines Take over-the-counter and prescription medicines only as told by your health care provider. Take a multivitamin if told by your health care provider. Do not use herbal or dietary supplements unless they are approved by your health care provider. Eating and drinking  Avoid heavy meals in the evening. Eat a well-balanced diet, which includes lean proteins, whole grains, plenty of fruits and vegetables, and low-fat dairy products. Avoid eating or drinking too many products with caffeine in them. Avoid alcohol. Drink enough fluid to keep your urine pale yellow. Activity  Exercise regularly, as told by your health care provider. Use or practice techniques to help you relax, such as yoga, tai chi, meditation, or massage therapy. Lifestyle Change situations that cause you stress. Try to keep your work and personal schedules in balance. Do not use recreational or illegal drugs. General instructions Monitor your fatigue for any changes. Go to bed and get up at  the same time every day. Avoid fatigue by pacing yourself during the day and getting enough sleep at night. Maintain a healthy weight. Contact a health care provider if: Your fatigue does not get better. You have a fever. You suddenly lose or gain weight. You have headaches. You have trouble falling asleep or sleeping through the night. You feel angry, guilty, anxious, or sad. You have swelling in your legs or another part of your body. Get help right away if: You feel confused, feel like you might faint, or faint. Your vision is blurry or you have a severe headache. You have severe pain in your abdomen, your back, or the area between your waist and hips (pelvis). You have chest pain, shortness of breath, or an irregular or fast heartbeat. You are unable to urinate, or you urinate less than normal. You have abnormal bleeding from the rectum, nose, lungs, nipples, or, if you are male, the vagina. You vomit blood. You have thoughts about hurting yourself or others. These symptoms may be an emergency. Get help right away. Call 911. Do not wait to see if the symptoms will go away. Do not drive yourself to the hospital. Get help right away if you feel like you may hurt yourself or others, or have thoughts about taking your own life. Go to your nearest emergency room or: Call 911. Call the [facility] at [phone] or [phone]. This is open 24 hours a day. Text the Crisis Text Line at 906-378-0412. Summary If you have fatigue, you feel tired all the time and have a lack of energy or a lack of motivation. Fatigue may make it difficult to  start or complete tasks because of exhaustion. Long-term (chronic) or extreme fatigue may be a symptom of a medical condition. Exercise regularly, as told by your health care provider. Change situations that cause you stress. Try to keep your work and personal schedules in balance. This information is not intended to replace advice given  to you by your health care provider. Make sure you discuss any questions you have with your health care provider. Document Revised: 12/14/2020 Document Reviewed: 12/14/2020 Elsevier Patient Education  [place].

## 2022-04-25 NOTE — Assessment & Plan Note (Signed)
Continue to urology

## 2022-04-25 NOTE — Assessment & Plan Note (Signed)
Labs will be checked today and medicines will be send to his pharmacy if needed

## 2022-04-25 NOTE — Assessment & Plan Note (Signed)
Continue follow-up with Cardiology

## 2022-04-25 NOTE — Assessment & Plan Note (Signed)
  Well controlled Continue klonopin and lunesta as ordered

## 2022-04-25 NOTE — Assessment & Plan Note (Signed)
Well controlled Amlodipine 5 mg OD Metoprolol XL 25 mg OD  Nutrition: Stressed importance of moderation in sodium intake, saturated fat and cholesterol, caloric balance, sufficient intake of complex carbohydrates, fiber, calcium and iron.   Exercise: Stressed the importance of regular exercise.

## 2022-04-28 ENCOUNTER — Telehealth: Payer: Self-pay | Admitting: Cardiovascular Disease

## 2022-04-28 NOTE — Telephone Encounter (Signed)
[facility] have not received clearance for this patient.  They would like that faxed to them at [phone]. They also wanted to add patient is having two dental implants instead of one.

## 2022-04-28 NOTE — Telephone Encounter (Signed)
    Patient reported some palpitations at my last visit with him on 04/04/2022, and I ordered a 2 week Zio monitor. This is still pending. However, we do not have to wait on this. Dental procedures are low risk and he was otherwise doing well at my visit with him. He was staying active (able to complete >4.0 METS) and denied any angina, shortness of breath, acute CHF symptoms, or syncope. Therefore, OK to procedure with dental implant. Dr. [name] has previously given the patient the OK to hold Aspirin and Plavix for 5-7 days prior to spinal surgery in 08/2021. He has had no new cardiac events since then so this same recommendation can be used. If procedure can be done without holding Aspirin that would be ideal but if necessary OK to hold both Aspirin and Plavix for 5-7 days. Please restart both as soon as safely possible following procedure. Recommend continuing Toprol-XL perioperatively.   I will go ahead and route this to requesting office via the Epic fax function.   Darreld Mclean, PA-C 04/28/2022 4:08 PM

## 2022-04-30 ENCOUNTER — Other Ambulatory Visit: Payer: Self-pay | Admitting: Student

## 2022-04-30 LAB — COMPREHENSIVE METABOLIC PANEL
ALT: 32 IU/L (ref 0–44)
AST: 28 IU/L (ref 0–40)
Albumin/Globulin Ratio: 1.7 (ref 1.2–2.2)
Albumin: 4.5 g/dL (ref 3.9–4.9)
Alkaline Phosphatase: 80 IU/L (ref 44–121)
BUN/Creatinine Ratio: 11 (ref 10–24)
BUN: 11 mg/dL (ref 8–27)
Bilirubin Total: 0.8 mg/dL (ref 0.0–1.2)
CO2: 19 mmol/L — ABNORMAL LOW (ref 20–29)
Calcium: 9.2 mg/dL (ref 8.6–10.2)
Chloride: 104 mmol/L (ref 96–106)
Creatinine, Ser: 0.98 mg/dL (ref 0.76–1.27)
Globulin, Total: 2.6 g/dL (ref 1.5–4.5)
Glucose: 86 mg/dL (ref 70–99)
Potassium: 4.8 mmol/L (ref 3.5–5.2)
Sodium: 139 mmol/L (ref 134–144)
Total Protein: 7.1 g/dL (ref 6.0–8.5)
eGFR: 84 mL/min/{1.73_m2} (ref >59)

## 2022-04-30 LAB — LIPID PANEL
Chol/HDL Ratio: 3 ratio (ref 0.0–5.0)
Cholesterol, Total: 87 mg/dL — ABNORMAL LOW (ref 100–199)
HDL: 29 mg/dL — ABNORMAL LOW (ref >39)
LDL Chol Calc (NIH): 37 mg/dL (ref 0–99)
Triglycerides: 115 mg/dL (ref 0–149)
VLDL Cholesterol Cal: 21 mg/dL (ref 5–40)

## 2022-04-30 LAB — CBC WITH DIFFERENTIAL/PLATELET
Basophils Absolute: 0 10*3/uL (ref 0.0–0.2)
Basos: 1 %
EOS (ABSOLUTE): 0.2 10*3/uL (ref 0.0–0.4)
Eos: 3 %
Hematocrit: 59.2 % — ABNORMAL HIGH (ref 37.5–51.0)
Hemoglobin: 19.6 g/dL — ABNORMAL HIGH (ref 13.0–17.7)
Immature Grans (Abs): 0 10*3/uL (ref 0.0–0.1)
Immature Granulocytes: 0 %
Lymphocytes Absolute: 1.6 10*3/uL (ref 0.7–3.1)
Lymphs: 22 %
MCH: 29 pg (ref 26.6–33.0)
MCHC: 33.1 g/dL (ref 31.5–35.7)
MCV: 88 fL (ref 79–97)
Monocytes Absolute: 0.4 10*3/uL (ref 0.1–0.9)
Monocytes: 6 %
Neutrophils Absolute: 4.8 10*3/uL (ref 1.4–7.0)
Neutrophils: 68 %
Platelets: 230 10*3/uL (ref 150–450)
RBC: 6.75 x10E6/uL — ABNORMAL HIGH (ref 4.14–5.80)
RDW: 14.7 % (ref 11.6–15.4)
WBC: 7.1 10*3/uL (ref 3.4–10.8)

## 2022-04-30 LAB — IRON,TIBC AND FERRITIN PANEL
Ferritin: 171 ng/mL (ref 30–400)
Iron Saturation: 31 % (ref 15–55)
Iron: 99 ug/dL (ref 38–169)
Total Iron Binding Capacity: 322 ug/dL (ref 250–450)
UIBC: 223 ug/dL (ref 111–343)

## 2022-04-30 LAB — VITAMIN B12: Vitamin B-12: 211 pg/mL — ABNORMAL LOW (ref 232–1245)

## 2022-04-30 LAB — MAGNESIUM: Magnesium: 2.1 mg/dL (ref 1.6–2.3)

## 2022-04-30 LAB — ZINC: Zinc: 79 ug/dL (ref 44–115)

## 2022-04-30 LAB — VITAMIN D 25 HYDROXY (VIT D DEFICIENCY, FRACTURES): Vit D, 25-Hydroxy: 16.6 ng/mL — ABNORMAL LOW (ref 30.0–100.0)

## 2022-04-30 LAB — CARDIOVASCULAR RISK ASSESSMENT

## 2022-05-01 ENCOUNTER — Other Ambulatory Visit: Payer: Self-pay | Admitting: Nurse Practitioner

## 2022-05-01 DIAGNOSIS — E538 Deficiency of other specified B group vitamins: Secondary | ICD-10-CM

## 2022-05-01 DIAGNOSIS — E559 Vitamin D deficiency, unspecified: Secondary | ICD-10-CM

## 2022-05-01 MED ORDER — VITAMIN B-12 1000 MCG PO TABS: 1000.0000 ug | ORAL_TABLET | Freq: Every day | ORAL | 1 refills | Status: DC

## 2022-05-01 MED ORDER — VITAMIN D (ERGOCALCIFEROL) 1.25 MG (50000 UNIT) PO CAPS: 50000.0000 [IU] | ORAL_CAPSULE | ORAL | 3 refills | Status: DC

## 2022-05-03 DIAGNOSIS — R002 Palpitations: Secondary | ICD-10-CM | POA: Diagnosis not present

## 2022-05-05 ENCOUNTER — Telehealth: Payer: Self-pay | Admitting: Cardiovascular Disease

## 2022-05-05 NOTE — Telephone Encounter (Signed)
*  STAT* If patient is at the pharmacy, call can be transferred to refill team.   1. Which medications need to be refilled? (please list name of each medication and dose if known)   amLODipine (NORVASC) 5 MG tablet   2. Which pharmacy/location (including street and city if local pharmacy) is medication to be sent to?  [id number] [place]  3. Do they need a 30 day or 90 day supply?   90 day   Patient stated he is completely out of this medication.

## 2022-05-06 MED ORDER — AMLODIPINE BESYLATE 5 MG PO TABS: 5.0000 mg | ORAL_TABLET | Freq: Every day | ORAL | 3 refills | Status: DC

## 2022-05-10 ENCOUNTER — Other Ambulatory Visit: Payer: Self-pay | Admitting: Cardiovascular Disease

## 2022-05-12 ENCOUNTER — Encounter: Payer: Self-pay | Admitting: Radiology

## 2022-05-14 ENCOUNTER — Emergency Department (HOSPITAL_BASED_OUTPATIENT_CLINIC_OR_DEPARTMENT_OTHER): Payer: Managed Care, Other (non HMO) | Admitting: Radiology

## 2022-05-14 ENCOUNTER — Encounter (HOSPITAL_BASED_OUTPATIENT_CLINIC_OR_DEPARTMENT_OTHER): Payer: Self-pay

## 2022-05-14 ENCOUNTER — Other Ambulatory Visit: Payer: Self-pay

## 2022-05-14 ENCOUNTER — Emergency Department (HOSPITAL_BASED_OUTPATIENT_CLINIC_OR_DEPARTMENT_OTHER): Payer: Managed Care, Other (non HMO)

## 2022-05-14 ENCOUNTER — Emergency Department (HOSPITAL_BASED_OUTPATIENT_CLINIC_OR_DEPARTMENT_OTHER)
Admission: EM | Admit: 2022-05-14 | Discharge: 2022-05-14 | Disposition: A | Discharge status: Home / Self Care | Payer: Managed Care, Other (non HMO) | Attending: Emergency Medicine | Admitting: Emergency Medicine

## 2022-05-14 DIAGNOSIS — Z8546 Personal history of malignant neoplasm of prostate: Secondary | ICD-10-CM | POA: Insufficient documentation

## 2022-05-14 DIAGNOSIS — Z7982 Long term (current) use of aspirin: Secondary | ICD-10-CM | POA: Diagnosis not present

## 2022-05-14 DIAGNOSIS — Z79899 Other long term (current) drug therapy: Secondary | ICD-10-CM | POA: Insufficient documentation

## 2022-05-14 DIAGNOSIS — I2510 Atherosclerotic heart disease of native coronary artery without angina pectoris: Secondary | ICD-10-CM | POA: Insufficient documentation

## 2022-05-14 DIAGNOSIS — R0602 Shortness of breath: Secondary | ICD-10-CM | POA: Diagnosis not present

## 2022-05-14 DIAGNOSIS — I10 Essential (primary) hypertension: Secondary | ICD-10-CM | POA: Diagnosis not present

## 2022-05-14 DIAGNOSIS — Z951 Presence of aortocoronary bypass graft: Secondary | ICD-10-CM | POA: Insufficient documentation

## 2022-05-14 DIAGNOSIS — Z7902 Long term (current) use of antithrombotics/antiplatelets: Secondary | ICD-10-CM | POA: Insufficient documentation

## 2022-05-14 DIAGNOSIS — K0889 Other specified disorders of teeth and supporting structures: Secondary | ICD-10-CM | POA: Diagnosis present

## 2022-05-14 DIAGNOSIS — M2763 Post-osseointegration mechanical failure of dental implant: Secondary | ICD-10-CM | POA: Insufficient documentation

## 2022-05-14 DIAGNOSIS — Z1152 Encounter for screening for COVID-19: Secondary | ICD-10-CM | POA: Insufficient documentation

## 2022-05-14 LAB — RESP PANEL BY RT-PCR (RSV, FLU A&B, COVID)  RVPGX2
Influenza A by PCR: NEGATIVE
Influenza B by PCR: NEGATIVE
Resp Syncytial Virus by PCR: NEGATIVE
SARS Coronavirus 2 by RT PCR: NEGATIVE

## 2022-05-14 LAB — CBC WITH DIFFERENTIAL/PLATELET
Abs Immature Granulocytes: 0.3 10*3/uL — ABNORMAL HIGH (ref 0.00–0.07)
Basophils Absolute: 0 10*3/uL (ref 0.0–0.1)
Basophils Relative: 0 %
Eosinophils Absolute: 0 10*3/uL (ref 0.0–0.5)
Eosinophils Relative: 0 %
HCT: 51.2 % (ref 39.0–52.0)
Hemoglobin: 17.3 g/dL — ABNORMAL HIGH (ref 13.0–17.0)
Immature Granulocytes: 2 %
Lymphocytes Relative: 6 %
Lymphs Abs: 1 10*3/uL (ref 0.7–4.0)
MCH: 29.8 pg (ref 26.0–34.0)
MCHC: 33.8 g/dL (ref 30.0–36.0)
MCV: 88.1 fL (ref 80.0–100.0)
Monocytes Absolute: 0.6 10*3/uL (ref 0.1–1.0)
Monocytes Relative: 4 %
Neutro Abs: 13.5 10*3/uL — ABNORMAL HIGH (ref 1.7–7.7)
Neutrophils Relative %: 88 %
Platelets: 163 10*3/uL (ref 150–400)
RBC: 5.81 MIL/uL (ref 4.22–5.81)
RDW: 15.4 % (ref 11.5–15.5)
WBC: 15.4 10*3/uL — ABNORMAL HIGH (ref 4.0–10.5)
nRBC: 0.1 % (ref 0.0–0.2)

## 2022-05-14 LAB — BASIC METABOLIC PANEL
Anion gap: 9 (ref 5–15)
BUN: 24 mg/dL — ABNORMAL HIGH (ref 8–23)
CO2: 21 mmol/L — ABNORMAL LOW (ref 22–32)
Calcium: 8.8 mg/dL — ABNORMAL LOW (ref 8.9–10.3)
Chloride: 107 mmol/L (ref 98–111)
Creatinine, Ser: 0.94 mg/dL (ref 0.61–1.24)
GFR, Estimated: >60 mL/min (ref >60)
Glucose, Bld: 166 mg/dL — ABNORMAL HIGH (ref 70–99)
Potassium: 3.9 mmol/L (ref 3.5–5.1)
Sodium: 137 mmol/L (ref 135–145)

## 2022-05-14 LAB — BRAIN NATRIURETIC PEPTIDE: B Natriuretic Peptide: 204.5 pg/mL — ABNORMAL HIGH (ref 0.0–100.0)

## 2022-05-14 MED ORDER — IOHEXOL 300 MG/ML  SOLN
100.0000 mL | Freq: Once | INTRAMUSCULAR | Status: AC | PRN
  Administered 2022-05-14: 75 mL via INTRAVENOUS

## 2022-05-14 MED ORDER — OXYCODONE HCL 5 MG PO TABS: 5.0000 mg | ORAL_TABLET | ORAL | 0 refills | Status: DC | PRN

## 2022-05-14 MED ORDER — CLINDAMYCIN HCL 300 MG PO CAPS: 300.0000 mg | ORAL_CAPSULE | Freq: Three times a day (TID) | ORAL | 0 refills | Status: AC

## 2022-05-14 MED ORDER — OXYCODONE HCL 5 MG PO TABS: 5.0000 mg | ORAL_TABLET | ORAL | 0 refills | Status: AC | PRN

## 2022-05-14 NOTE — ED Triage Notes (Signed)
Patient here POV from Home.  Endorses having recent Dental Procedure completed 2 Weeks ago. Had 2 Posts inserted and the One in the Lower Left has been very painful. Swelling and Painful.   No Known fevers. Became SOB a few days ago prompting ED Visit. Dexamethasone has been helpful with Swelling and was given Amoxicillin as well.   NAD Noted during Triage. A&Ox4. GCS 15. Ambulatory.

## 2022-05-14 NOTE — ED Notes (Signed)
Discharge instructions discussed with pt. Pt verbalized understanding. Pt stable and ambulatory.  °

## 2022-05-14 NOTE — ED Provider Notes (Signed)
Care of patient received from prior provider at 3:11 PM, please see their note for complete H/P and care plan.  Received handoff per ED course.  Clinical Course as of 05/14/22 1511  Sat May 14, 2022  1508 Stable 66 YOM with dental pain #19 bottom left pain. Facial swelling CT soft tissue neck  Screening labs If negative ok for outpatient.  Consider clinda for dental problems.  [CC]    Clinical Course User Index [CC] Tretha Sciara, MD    Reassessment: CT with no acute findings.  Per prior provider, placed patient on clinda and have follow-up with primary dentist.  Patient is highly concerned about ongoing pain control throughout the weekend.  He states that he recently finished his steroid prescription but that was not controlling the pain.  Is not been able to eat because of severe pain in the mouth.  Given recent operative procedure, I believe would be reasonable for short course of pain control throughout the week and with plan for patient to call dentist on Monday.  Patient discharged with no further acute events.     Tretha Sciara, MD 05/14/22 [phone]

## 2022-05-18 ENCOUNTER — Telehealth: Payer: Self-pay

## 2022-05-18 NOTE — Telephone Encounter (Signed)
        Patient  visited [facility] on 05/14/2022  for Fracture of dental implant.   Telephone encounter attempt :  1st  A HIPAA compliant voice message was left requesting a return call.  Instructed patient to call back at [phone].   [place] Resource Care Guide   [email or web address]  ?? [phone]   Website: [email or web address]  [email or web address]

## 2022-05-19 ENCOUNTER — Telehealth: Payer: Self-pay

## 2022-05-19 NOTE — Telephone Encounter (Signed)
        Patient  visited [facility] on 05/14/2022  for Fracture of dental implant.   Telephone encounter attempt :  2nd  A HIPAA compliant voice message was left requesting a return call.  Instructed patient to call back at [phone].   [place] Resource Care Guide   [email or web address]  ?? [phone]   Website: [email or web address]  [email or web address]

## 2022-05-23 ENCOUNTER — Telehealth: Payer: Self-pay

## 2022-05-23 NOTE — Telephone Encounter (Signed)
     Patient  visit on 05/14/2022  at [facility] was for Fracture of dental implant.  Have you been able to follow up with your primary care physician? Patient followed up with Oral Surgeon.  The patient was or was not able to obtain any needed medicine or equipment. Patient was able to obtain medication.  Are there diet recommendations that you are having difficulty following? No  Patient expresses understanding of discharge instructions and education provided has no other needs at this time. Yes   [place] Resource Care Guide   [email or web address]  ?? [phone]   Website: [email or web address]  [email or web address]

## 2022-05-26 ENCOUNTER — Other Ambulatory Visit: Payer: Self-pay

## 2022-05-26 ENCOUNTER — Telehealth: Payer: Self-pay | Admitting: Cardiovascular Disease

## 2022-05-26 DIAGNOSIS — I25110 Atherosclerotic heart disease of native coronary artery with unstable angina pectoris: Secondary | ICD-10-CM

## 2022-05-26 DIAGNOSIS — I471 Supraventricular tachycardia, unspecified: Secondary | ICD-10-CM

## 2022-05-26 DIAGNOSIS — F5101 Primary insomnia: Secondary | ICD-10-CM

## 2022-05-26 MED ORDER — ESZOPICLONE 3 MG PO TABS: ORAL_TABLET | ORAL | 2 refills | Status: DC

## 2022-05-26 NOTE — Telephone Encounter (Signed)
Pt wanted to give [facility] PA the go ahead to schedule the evaluation of the ablation.

## 2022-05-27 NOTE — Telephone Encounter (Signed)
Jacob Collier, can we please refer him to EP for consideration of SVT ablation?  Thank you

## 2022-05-30 ENCOUNTER — Other Ambulatory Visit: Payer: Self-pay

## 2022-05-30 DIAGNOSIS — F5101 Primary insomnia: Secondary | ICD-10-CM

## 2022-05-31 MED ORDER — ESZOPICLONE 3 MG PO TABS: ORAL_TABLET | ORAL | 2 refills | Status: DC

## 2022-05-31 NOTE — Telephone Encounter (Signed)
Referral entered. Mychart message sent to pt.

## 2022-06-09 NOTE — ED Provider Notes (Signed)
[place] EMERGENCY DEPARTMENT AT [facility] Provider Note   CSN: [id number] Arrival date & time: 05/14/22  1346     History  Chief Complaint  Patient presents with   Dental Pain    Jacob Collier is a 67 y.o. male with CAD s/p CABG x4, paroxysmal SVT, prostate cancer s/p prostatectomy 2015, urethral stricture, HLD, who  presents with dental pain.  Patient presents with dental pain and swelling of his face. He had two posts inserted, one upper and one lower. The lower one has been very painful over a few days ago a/w swelling of the left side of his face. No fevers chills, difficulty swallowing, difficulty moving his neck, SOB or stridor. Has already taken amoxicillin for similar pain. He and his wife also feel like his whole whole face and upper body has been flushed and swollen lately, unsure why.    Dental Pain      Home Medications Prior to Admission medications   Medication Sig Start Date End Date Taking? Authorizing Provider  acetaminophen (TYLENOL) 500 MG tablet Take 1,000 mg by mouth every 8 (eight) hours as needed for mild pain.    [provider]  amLODipine (NORVASC) 5 MG tablet Take 1 tablet (5 mg total) by mouth at bedtime. 05/06/22   Troy Sine, MD  aspirin EC 81 MG tablet Take 81 mg by mouth daily. Swallow whole.    [provider]  clonazePAM (KLONOPIN) 1 MG tablet TAKE 1 TABLET BY MOUTH TWICE A DAY AS NEEDED FOR ANXIETY 04/01/22   Cox, Kirsten, MD  clopidogrel (PLAVIX) 75 MG tablet TAKE 1 TABLET (75 MG TOTAL) BY MOUTH DAILY. STOP TAKING PLAVIX ON SATURDAY NOV. 5, 2022 11/03/21   Troy Sine, MD  cyanocobalamin (VITAMIN B12) 1000 MCG tablet Take 1 tablet (1,000 mcg total) by mouth daily. 05/01/22   Neil Crouch, FNP  Eszopiclone 3 MG TABS TAKE 1 TABLET BY MOUTH EVERYDAY AT BEDTIME 05/31/22   Cox, Kirsten, MD  Evolocumab (REPATHA SURECLICK) [id number] MG/ML SOAJ INJECT 140 MG INTO THE SKIN EVERY 14 (FOURTEEN) DAYS. 01/31/22   Troy Sine,  MD  ezetimibe (ZETIA) 10 MG tablet TAKE 1 TABLET BY MOUTH EVERY DAY 05/10/22   Troy Sine, MD  metoprolol succinate (TOPROL-XL) 25 MG 24 hr tablet TAKE 0.5 TABLET BY MOUTH DAILY. TAKE WITH OR IMMEDIATELY FOLLOWING A MEAL. 03/01/22   Troy Sine, MD  Omega-3 Fatty Acids (OMEGA 3 PO) Take 1 capsule by mouth 2 (two) times daily.    [provider]  Vitamin D, Ergocalciferol, (DRISDOL) 1.25 MG (50000 UNIT) CAPS capsule Take 1 capsule (50,000 Units total) by mouth every 7 (seven) days. 05/01/22   Neil Crouch, FNP      Allergies    Ranolazine, Atorvastatin, Pravastatin, Rosuvastatin, and Statins    Review of Systems   Review of Systems Review of systems Negative for f/c.  A 10 point review of systems was performed and is negative unless otherwise reported in HPI.  Physical Exam Updated Vital Signs BP (!) 146/79 (BP Location: Right Arm)   Pulse (!) 54   Temp 98 F (36.7 C)   Resp (!) 22   Ht 5\' 11"  (1.803 m)   Wt 97.5 kg   SpO2 98%   BMI 29.98 kg/m  Physical Exam General: Normal appearing male, lying in bed.  HEENT: PERRLA, Sclera anicteric, MMM, trachea midline. Poor dentition with posts in several places. Gingiva surrounding left post at tooth #19 is  swollen/erythematous with TTP. No posterior orpharyngeal swelling. Tonsils appear normal. Mild swelling to left buccal area with TTP. +TTP noted submandibularly as well as in the midline though no overt swelling appreciated. Face, neck, and upper chest are flushed red without any induration/fluctuance. Cardiology: RRR, no murmurs/rubs/gallops. Resp: Normal respiratory rate and effort. CTAB, no wheezes, rhonchi, crackles.  Abd: Soft, non-tender, non-distended. No rebound tenderness or guarding.  GU: Deferred. MSK: No peripheral edema or signs of trauma. Extremities without deformity or TTP. No cyanosis or clubbing. Skin: warm, dry.  Neuro: A&Ox4, CNs II-XII grossly intact. MAEs. Sensation grossly intact.  Psych: Normal mood  and affect.   ED Results / Procedures / Treatments   Labs (all labs ordered are listed, but only abnormal results are displayed) Labs Reviewed  CBC WITH DIFFERENTIAL/PLATELET - Abnormal; Notable for the following components:      Result Value   WBC 15.4 (*)    Hemoglobin 17.3 (*)    Neutro Abs 13.5 (*)    Abs Immature Granulocytes 0.30 (*)    All other components within normal limits  BASIC METABOLIC PANEL - Abnormal; Notable for the following components:   CO2 21 (*)    Glucose, Bld 166 (*)    BUN 24 (*)    Calcium 8.8 (*)    All other components within normal limits  BRAIN NATRIURETIC PEPTIDE - Abnormal; Notable for the following components:   B Natriuretic Peptide 204.5 (*)    All other components within normal limits  RESP PANEL BY RT-PCR (RSV, FLU A&B, COVID)  [id number]    EKG EKG Interpretation  Date/Time:  Saturday May 14 2022 14:00:33 EDT Ventricular Rate:  59 PR Interval:  155 QRS Duration: 113 QT Interval:  462 QTC Calculation: 458 R Axis:   83 Text Interpretation: Sinus rhythm Probable left atrial enlargement LVH with secondary repolarization abnormality Inferior infarct, old Confirmed by Nanda Quinton [id number]) on 05/15/2022 10:56:53 AM  Radiology No results found.  Procedures Procedures    Medications Ordered in ED Medications  iohexol (OMNIPAQUE) 300 MG/ML solution 100 mL (75 mLs Intravenous Contrast Given 05/14/22 1605)    ED Course/ Medical Decision Making/ A&P                          Medical Decision Making Amount and/or Complexity of Data Reviewed Labs: ordered. Radiology: ordered.  Risk Prescription drug management.    This patient presents to the ED for concern of dental pain/facial swelling, this involves an extensive number of treatment options, and is a complaint that carries with it a high risk of complications and morbidity.  However patient is overall very well-appearing, HDS.  MDM:    Consider dental abscess vs infection of post  vs surrounding teeth. Could be failure or issue with implant itself such as fracture o rmalpositioning. Consider facial abscess given swelling/tenderness. Also must consider ludwig's angina given patient's submandibular tenderness though no gross swelling appreciated, no stridor/SOB. Will obtain CT soft tissue neck to r/o extension of infection and likely treat with cilnda as he has already been treated with amoxicillin. I instructed patient that the solution to this problem is to see a dentist, preferably his dentist, and he will call them in the AM.  Given the report of flushed/swollen face, consider a thoracic outlet syndrome, has h/o cancer, but CXR doesn't show any tumors or e/o TOS, just mild bronchial wall thickening.  Clinical Course as of 06/09/22 1423  Sat May 14, 2022  1508 Stable 66 YOM with dental pain #19 bottom left pain. Facial swelling CT soft tissue neck  Screening labs If negative ok for outpatient.  Consider clinda for dental problems.  [CC]    Clinical Course User Index [CC] Tretha Sciara, MD    Labs: Pending  Imaging Studies ordered: I ordered imaging studies including CT soft tissue neck  Pending at signout  Social Determinants of Health: Patient lives independently   Disposition:  Signed out to oncoming physician Dr Oswald Hillock  Co morbidities that complicate the patient evaluation  Past Medical History:  Diagnosis Date   Anxiety    Arthritis    Coronary artery disease    a. S/p DES to RCA in 01/2019 b. s/p CABG x4 (LIMA-LAD, reverse SVG-PDA, reverse SVG-OM1, reverse SVG-1st Diag) in 01/2021   Diverticulosis    Dyspnea    Hard of hearing    Hyperlipidemia    Hypertension    hx of elevation on lyrica, off med and now normal    Paroxysmal SVT (supraventricular tachycardia)    Post-op atrial fibrillation    Prostate cancer ([place])    Wears glasses      Medicines Meds ordered this encounter  Medications   iohexol (OMNIPAQUE) 300 MG/ML solution  100 mL   DISCONTD: oxyCODONE (ROXICODONE) 5 MG immediate release tablet    Sig: Take 1 tablet (5 mg total) by mouth every 4 (four) hours as needed for up to 5 days for severe pain.    Dispense:  15 tablet    Refill:  0   oxyCODONE (ROXICODONE) 5 MG immediate release tablet    Sig: Take 1 tablet (5 mg total) by mouth every 4 (four) hours as needed for up to 5 days for severe pain.    Dispense:  15 tablet    Refill:  0   clindamycin (CLEOCIN) 300 MG capsule    Sig: Take 1 capsule (300 mg total) by mouth 3 (three) times daily for 5 days.    Dispense:  15 capsule    Refill:  0    I have reviewed the patients home medicines and have made adjustments as needed  Problem List / ED Course: Problem List Items Addressed This Visit   None Visit Diagnoses     Fracture of dental implant    -  Primary                   This note was created using dictation software, which may contain spelling or grammatical errors.    Audley Hose, MD 06/09/22 1434

## 2022-06-10 ENCOUNTER — Encounter: Payer: Self-pay | Admitting: Physician Assistant

## 2022-06-10 ENCOUNTER — Ambulatory Visit (INDEPENDENT_AMBULATORY_CARE_PROVIDER_SITE_OTHER): Payer: Managed Care, Other (non HMO) | Admitting: Physician Assistant

## 2022-06-10 DIAGNOSIS — M47816 Spondylosis without myelopathy or radiculopathy, lumbar region: Secondary | ICD-10-CM

## 2022-06-10 DIAGNOSIS — M25551 Pain in right hip: Secondary | ICD-10-CM

## 2022-06-10 MED ORDER — BUPIVACAINE HCL 0.25 % IJ SOLN
2.0000 mL | INTRAMUSCULAR | Status: AC | PRN
  Administered 2022-06-10: 2 mL via INTRA_ARTICULAR

## 2022-06-10 MED ORDER — METHYLPREDNISOLONE ACETATE 40 MG/ML IJ SUSP
80.0000 mg | INTRAMUSCULAR | Status: AC | PRN
  Administered 2022-06-10: 80 mg via INTRA_ARTICULAR

## 2022-06-10 MED ORDER — METHYLPREDNISOLONE 4 MG PO TBPK: ORAL_TABLET | ORAL | 0 refills | Status: DC

## 2022-06-10 MED ORDER — LIDOCAINE HCL 1 % IJ SOLN
2.0000 mL | INTRAMUSCULAR | Status: AC | PRN
  Administered 2022-06-10: 2 mL

## 2022-06-10 NOTE — Progress Notes (Signed)
Office Visit Note   Patient: Jacob Collier           Date of Birth: 11/01/1955           MRN: [id number] Visit Date: 06/10/2022              Requested by: Blane Ohara, MD [address] [place],  [place] [address] PCP: Blane Ohara, MD  No chief complaint on file.     HPI: Patient is a pleasant 67 year old gentleman with a history of left trochanteric bursitis.  He has had reoccurrence of the pain directly over the trochanteric bursa and comes in today requesting an injection.  Also has a long history of back problems for which she is seeing Dr. Dutch Quint.  He would like to be referred to Dr. Dutch Quint as he is having increasing pain and difficulties.  No new injury but he does work on Production designer, theatre/television/film & Plan: Visit Diagnoses:  1. Spondylosis without myelopathy or radiculopathy, lumbar region   Trochanteric bursitis left hip  Plan: Injection to the trochanteric bursa given today will follow-up with me as needed referral placed  Follow-Up Instructions: No follow-ups on file.   Ortho Exam  Patient is alert, oriented, no adenopathy, well-dressed, normal affect, normal respiratory effort. Examination no groin pain no pain with motion.  He is focally tender over the trochanteric bursa there is no erythema or warmth  Imaging: No results found. No images are attached to the encounter.  Labs: Lab Results  Component Value Date   HGBA1C 5.8 (H) 03/04/2021   HGBA1C 5.8 (H) 01/15/2021   HGBA1C 5.8 (H) 11/28/2019   ESRSEDRATE 9 04/01/2020   CRP 1 10/26/2020   REPTSTATUS 09/26/2014 FINAL 09/24/2014   CULT  09/24/2014    >=100,000 COLONIES/mL KLEBSIELLA PNEUMONIAE Performed at [facility]    [facility] KLEBSIELLA PNEUMONIAE 09/24/2014     Lab Results  Component Value Date   ALBUMIN 4.5 04/25/2022   ALBUMIN 4.6 12/29/2021   ALBUMIN 4.5 12/21/2021    Lab Results  Component Value Date   MG 2.1 04/25/2022   MG 2.2 04/04/2022   MG 2.2 01/20/2021    Lab Results  Component Value Date   VD25OH 16.6 (L) 04/25/2022    No results found for: "PREALBUMIN"    Latest Ref Rng & Units 05/14/2022    2:50 PM 04/25/2022    4:17 PM 11/11/2021    4:28 PM  CBC EXTENDED  WBC 4.0 - 10.5 K/uL 15.4  7.1  8.0   RBC 4.22 - 5.81 MIL/uL 5.81  6.75  5.69   Hemoglobin 13.0 - 17.0 g/dL 81.1  57.2  62.0   HCT 39.0 - 52.0 % 51.2  59.2  48.6   Platelets 150 - 400 K/uL 163  230  212   NEUT# 1.7 - 7.7 K/uL 13.5  4.8    Lymph# 0.7 - 4.0 K/uL 1.0  1.6       There is no height or weight on file to calculate BMI.  Orders:  Orders Placed This Encounter  Procedures  . Ambulatory referral to Neurosurgery   Meds ordered this encounter  Medications  . methylPREDNISolone (MEDROL DOSEPAK) 4 MG TBPK tablet    Sig: Take with foodas directed    Dispense:  21 tablet    Refill:  0     Procedures: Large Joint Inj: L greater trochanter on 06/10/2022 3:49 PM Indications: pain and diagnostic evaluation Details: 25 G 1.5 in needle,  lateral approach  Arthrogram: No  Medications: 2 mL lidocaine 1 %; 80 mg methylPREDNISolone acetate 40 MG/ML; 2 mL bupivacaine 0.25 % Outcome: tolerated well, no immediate complications Procedure, treatment alternatives, risks and benefits explained, specific risks discussed. Consent was given by the patient. Immediately prior to procedure a time out was called to verify the correct patient, procedure, equipment, support staff and site/side marked as required. Patient was prepped and draped in the usual sterile fashion.    Clinical Data: No additional findings.  ROS:  All other systems negative, except as noted in the HPI. Review of Systems  Objective: Vital Signs: There were no vitals taken for this visit.  Specialty Comments:  MRI CERVICAL SPINE WITHOUT CONTRAST   TECHNIQUE: Multiplanar, multisequence MR imaging of the cervical spine was performed. No intravenous contrast was administered.   COMPARISON:  X-ray cervical  04/05/2021. CT cervical spine November 30, 2011.   FINDINGS: Alignment: 3 mm anterolisthesis of C4 over C5 and 4 mm anterolisthesis of C7 over T1. Small retrolisthesis of C5 over C6.   Vertebrae: No fracture, evidence of discitis, or bone lesion. Endplate degenerative changes at C5-6 and C6-7.   Cord: Mass effect on the cord at C5-6 and C6-7 with increase of the T2 signal within the cord at C5-6.   Posterior Fossa, vertebral arteries, paraspinal tissues: Negative.   Disc levels:   C2-3: Facet degenerative changes. No spinal canal or neural foraminal stenosis.   C3-4: Posterior disc osteophyte complex resulting in mild spinal canal stenosis. Uncovertebral and facet degenerative changes with prominent hypertrophy of the right facet joint resulting in severe right neural foraminal narrowing.   C4-5: Posterior disc osteophyte complex resulting in mild spinal canal stenosis. Uncovertebral and facet degenerative changes. Prominent hypertrophy of the right facet joint resulting in severe bilateral neural foraminal narrowing.   C5-6: Posterior disc osteophyte complex resulting in moderate to severe spinal canal stenosis with mass effect on the cord and increased T2 signal within the cord suggesting cord edema versus myelomalacia. Uncovertebral and facet degenerative changes resulting in severe bilateral neural foraminal narrowing.   C6-7: Posterior disc osteophyte complex resulting in moderate spinal canal stenosis with mild mass effect on the cord. Uncovertebral and facet degenerative changes resulting in severe bilateral neural foraminal narrowing.   C7-T1: Anterolisthesis, disc bulge/disc uncovering and hypertrophic facet degenerative changes resulting in moderate bilateral neural foraminal narrowing. No significant spinal canal stenosis.   IMPRESSION: 1. Advanced degenerative changes of the cervical spine, worst at C5-6 where there is moderate to severe spinal canal  stenosis with mass effect on the cord and increased cord signal on T2, suggesting cord edema versus myelomalacia. 2. Moderate spinal canal stenosis at C6-7. 3. Severe bilateral neural foraminal narrowing at C4-5, C5-6 and C6-7 and on the right at C3-4. 4. Moderate bilateral neural foraminal narrowing at C7-T1.     Electronically Signed   By: Baldemar Lenis M.D.   On: 05/10/2021 09:52  PMFS History: Patient Active Problem List   Diagnosis Date Noted  . Other fatigue 04/25/2022  . Vitamin D deficiency 04/25/2022  . Coronary artery disease 04/04/2022  . Hyperlipidemia 04/04/2022  . Joint pain 11/05/2021  . Cervical spondylosis with myelopathy and radiculopathy 09/03/2021  . Sacroiliitis 07/13/2021  . Hyperglycemia 03/04/2021  . Urethral stricture 01/21/2021  . S/P CABG x 4 01/19/2021  . Hypertension 12/08/2020  . Paroxysmal supraventricular tachycardia 11/26/2020  . Lower respiratory tract infection due to COVID-19 virus 09/21/2020  . Polycythemia 06/22/2020  .  Chronic arthralgias of knees and hips 05/19/2020  . Myalgia due to statin 05/19/2020  . Bronchopneumonia 02/03/2020  . ED (erectile dysfunction) 10/22/2019  . Sciatica without back pain, left 10/22/2019  . History of total left hip replacement 10/10/2019  . Primary insomnia 07/05/2019  . Anxiety   . Allergic rhinitis 05/16/2019  . H/O heart artery stent 01/18/2019  . Sigmoid stricture 04/13/2018  . Osteoarthritis of left hip 11/10/2015  . Status post left hip replacement 11/10/2015  . Diverticulosis 09/24/2014  . Prostate cancer 09/24/2014  . Chronic back pain 09/24/2014  . Murmur 06/06/2014   Past Medical History:  Diagnosis Date  . Anxiety   . Arthritis   . Coronary artery disease    a. S/p DES to RCA in 01/2019 b. s/p CABG x4 (LIMA-LAD, reverse SVG-PDA, reverse SVG-OM1, reverse SVG-1st Diag) in 01/2021  . Diverticulosis   . Dyspnea   . Hard of hearing   . Hyperlipidemia   . Hypertension     hx of elevation on lyrica, off med and now normal   . Paroxysmal SVT (supraventricular tachycardia)   . Post-op atrial fibrillation   . Prostate cancer ([facility])   . Wears glasses     Family History  Problem Relation Age of Onset  . Alzheimer's disease Mother   . Stroke Father   . Diabetes Sister   . Heart attack Maternal Grandmother   . Cancer Maternal Uncle        prostate  . Prostate cancer Maternal Uncle   . Cancer Maternal Uncle        prostate  . Prostate cancer Maternal Uncle   . Colon polyps Brother   . Colon cancer Neg Hx   . Esophageal cancer Neg Hx   . Rectal cancer Neg Hx   . Stomach cancer Neg Hx     Past Surgical History:  Procedure Laterality Date  . ANTERIOR CERVICAL DECOMP/DISCECTOMY FUSION N/A 09/03/2021   Procedure: CERVICAL FIVE-SIX, CERVICAL SIX-SEVEN ANTERIOR CERVICAL DECOMPRESSION/DISCECTOMY FUSION;  Surgeon: Julio SicksPool, Henry, MD;  [facility];  Service: Neurosurgery;  Laterality: N/A;  . CARDIAC CATHETERIZATION     with 2 stents   . COLECTOMY  05/2018  . COLONOSCOPY    . CORONARY ARTERY BYPASS GRAFT N/A 01/19/2021   Procedure: CORONARY ARTERY BYPASS GRAFTING (CABG), ON PUMP TIMES FOUR, USING LEFT INTERNAL MAMMARY ARTERY AND ENDOSOCPICALLY HARVESTED RIGHT GREATER SAPHENOUS VEIN;  Surgeon: Corliss SkainsLightfoot, Harrell O, MD;  Location: [facility] OR;  Service: Open Heart Surgery;  Laterality: N/A;  . CYSTOSCOPY WITH URETHRAL DILATATION N/A 01/14/2021   Procedure: CYSTOSCOPY WITH BALLOON  DILATATION;  Surgeon: Alfredo MartinezMacDiarmid, Scott, MD;  Location: [facility] ORS;  Service: Urology;  Laterality: N/A;  . EYE SURGERY  02/2020   bilaterl cataracts  . IR THORACENTESIS ASP PLEURAL SPACE W/IMG GUIDE  02/02/2021  . LEFT HEART CATH AND CORONARY ANGIOGRAPHY N/A 12/24/2020   Procedure: LEFT HEART CATH AND CORONARY ANGIOGRAPHY;  Surgeon: SwazilandJordan, Peter M, MD;  [facility];  Service: Cardiovascular;  Laterality: N/A;  . PROSTATECTOMY    . TEE WITHOUT CARDIOVERSION N/A 01/19/2021    Procedure: TRANSESOPHAGEAL ECHOCARDIOGRAM (TEE);  Surgeon: Corliss SkainsLightfoot, Harrell O, MD;  [facility];  Service: Open Heart Surgery;  Laterality: N/A;  . TOTAL HIP ARTHROPLASTY Left 11/10/2015   Procedure: LEFT TOTAL HIP ARTHROPLASTY ANTERIOR APPROACH;  Surgeon: Kathryne Hitchhristopher Y Blackman, MD;  Location: [facility] OR;  Service: Orthopedics;  Laterality: Left;   Social History   Occupational History  . Occupation: Employed  at [employer]  Tobacco Use  . Smoking status: Never  . Smokeless tobacco: Never  Vaping Use  . Vaping Use: Never used  Substance and Sexual Activity  . Alcohol use: Not Currently    Alcohol/week: 0.0 standard drinks of alcohol    Comment: rarely  . Drug use: No  . Sexual activity: Yes    Partners: Female    Birth control/protection: None

## 2022-06-12 NOTE — Progress Notes (Unsigned)
Electrophysiology Office Note:    Date:  06/13/2022   ID:  Jacob Collier, DOB 19-Apr-1955, MRN [id number]  PCP:  Blane Oharaox, Kirsten, MD   [facility] Providers Cardiologist:  Nicki Guadalajarahomas Kelly, MD Cardiology APP:  Corrin ParkerGoodrich, Callie E, PA-C  Electrophysiologist:  Maurice SmallAugustus E Shardee Dieu, MD     Referring MD: Corrin ParkerGoodrich, Callie E, PA-C   History of Present Illness:    Jacob Clockimothy L Casanova is a 67 y.o. male with a hx listed below, significant for paroxysmal SVT, referred for arrhythmia management.  He recently wore a monitor that detected occasional episodes of tachycardia. The longest lasted almost 15 minutes.  Atrial activity is difficult to discern due to artifact.  He has had rapid and irregular rhythms for years, predating his CABG.  Seems like they have gotten worse recently.  He is particular bothered by rapid rates which last for a few minutes.  He uses a pulse ox and it frequently shows rates of about 145 bpm.  These occur with rapid onset but oftentimes slow down gradually.  He also notices occasional irregular rhythms.  He has not had syncope.  He has had edema with amlodipine.  He does not tolerate higher doses of beta-blocker very well.   Past Medical History:  Diagnosis Date   Anxiety    Arthritis    Coronary artery disease    a. S/p DES to RCA in 01/2019 b. s/p CABG x4 (LIMA-LAD, reverse SVG-PDA, reverse SVG-OM1, reverse SVG-1st Diag) in 01/2021   Diverticulosis    Dyspnea    Hard of hearing    Hyperlipidemia    Hypertension    hx of elevation on lyrica, off med and now normal    Paroxysmal SVT (supraventricular tachycardia)    Post-op atrial fibrillation    Prostate cancer    Wears glasses     Past Surgical History:  Procedure Laterality Date   ANTERIOR CERVICAL DECOMP/DISCECTOMY FUSION N/A 09/03/2021   Procedure: CERVICAL FIVE-SIX, CERVICAL SIX-SEVEN ANTERIOR CERVICAL DECOMPRESSION/DISCECTOMY FUSION;  Surgeon: Julio SicksPool, Henry, MD;  Location: [facility] OR;  Service: Neurosurgery;   Laterality: N/A;   CARDIAC CATHETERIZATION     with 2 stents    COLECTOMY  05/2018   COLONOSCOPY     CORONARY ARTERY BYPASS GRAFT N/A 01/19/2021   Procedure: CORONARY ARTERY BYPASS GRAFTING (CABG), ON PUMP TIMES FOUR, USING LEFT INTERNAL MAMMARY ARTERY AND ENDOSOCPICALLY HARVESTED RIGHT GREATER SAPHENOUS VEIN;  Surgeon: Corliss SkainsLightfoot, Harrell O, MD;  Location: [facility] OR;  Service: Open Heart Surgery;  Laterality: N/A;   CYSTOSCOPY WITH URETHRAL DILATATION N/A 01/14/2021   Procedure: CYSTOSCOPY WITH BALLOON  DILATATION;  Surgeon: Alfredo MartinezMacDiarmid, Scott, MD;  Location: [facility] OR[facility];  Service: Urology;  Laterality: N/A;   EYE SURGERY  02/2020   bilaterl cataracts   IR THORACENTESIS ASP PLEURAL SPACE W/IMG GUIDE  02/02/2021   LEFT HEART CATH AND CORONARY ANGIOGRAPHY N/A 12/24/2020   Procedure: LEFT HEART CATH AND CORONARY ANGIOGRAPHY;  Surgeon: SwazilandJordan, Peter M, MD;  [facility];  Service: Cardiovascular;  Laterality: N/A;   PROSTATECTOMY     TEE WITHOUT CARDIOVERSION N/A 01/19/2021   Procedure: TRANSESOPHAGEAL ECHOCARDIOGRAM (TEE);  Surgeon: Corliss SkainsLightfoot, Harrell O, MD;  [facility];  Service: Open Heart Surgery;  Laterality: N/A;   TOTAL HIP ARTHROPLASTY Left 11/10/2015   Procedure: LEFT TOTAL HIP ARTHROPLASTY ANTERIOR APPROACH;  Surgeon: Kathryne Hitchhristopher Y Blackman, MD;  Location: [facility] OR;  Service: Orthopedics;  Laterality: Left;    Current Medications: Current Meds  Medication Sig  acetaminophen (TYLENOL) 500 MG tablet Take 1,000 mg by mouth every 8 (eight) hours as needed for mild pain.   amLODipine (NORVASC) 5 MG tablet Take 1 tablet (5 mg total) by mouth at bedtime.   aspirin EC 81 MG tablet Take 81 mg by mouth daily. Swallow whole.   clonazePAM (KLONOPIN) 1 MG tablet TAKE 1 TABLET BY MOUTH TWICE A DAY AS NEEDED FOR ANXIETY   clopidogrel (PLAVIX) 75 MG tablet TAKE 1 TABLET (75 MG TOTAL) BY MOUTH DAILY. STOP TAKING PLAVIX ON SATURDAY NOV. 5, 2022   cyanocobalamin (VITAMIN B12) 1000 MCG tablet  Take 1 tablet (1,000 mcg total) by mouth daily.   Eszopiclone 3 MG TABS TAKE 1 TABLET BY MOUTH EVERYDAY AT BEDTIME   Evolocumab (REPATHA SURECLICK) 140 MG/ML SOAJ INJECT 140 MG INTO THE SKIN EVERY 14 (FOURTEEN) DAYS.   ezetimibe (ZETIA) 10 MG tablet TAKE 1 TABLET BY MOUTH EVERY DAY   methylPREDNISolone (MEDROL DOSEPAK) 4 MG TBPK tablet Take with foodas directed   metoprolol succinate (TOPROL-XL) 25 MG 24 hr tablet TAKE 0.5 TABLET BY MOUTH DAILY. TAKE WITH OR IMMEDIATELY FOLLOWING A MEAL.   Omega-3 Fatty Acids (OMEGA 3 PO) Take 1 capsule by mouth 2 (two) times daily.   Vitamin D, Ergocalciferol, (DRISDOL) 1.25 MG (50000 UNIT) CAPS capsule Take 1 capsule (50,000 Units total) by mouth every 7 (seven) days.     Allergies:   Ranolazine, Atorvastatin, Pravastatin, Rosuvastatin, and Statins   Social and Family History: Reviewed in Epic  ROS:   Please see the history of present illness.    All other systems reviewed and are negative.  EKGs/Labs/Other Studies Reviewed Today:    Echocardiogram:  TEE 02/01/21 EF 50% with mildly hypokinetic inferior wall    Monitors:  ZioXT 11 days 04/2022 Sinus rhythm HR 47-99 bpm, avg 69. 2% PACs  Multiple episodes of narrow complex tachycardia One strip (2/10 10:11:03) appears to show a short R-P  EKG:  Last EKG results: sinus rhythm with PACs   Recent Labs: 04/04/2022: TSH 1.570 04/25/2022: ALT 32; Magnesium 2.1 05/14/2022: B Natriuretic Peptide 204.5; BUN 24; Creatinine, Ser 0.94; Hemoglobin 17.3; Platelets 163; Potassium 3.9; Sodium 137     Physical Exam:    VS:  BP 134/80   Pulse 69   Ht 5\' 11"  (1.803 m)   Wt 220 lb (99.8 kg)   SpO2 95%   BMI 30.68 kg/m     Wt Readings from Last 3 Encounters:  06/13/22 220 lb (99.8 kg)  05/14/22 214 lb 15.2 oz (97.5 kg)  04/25/22 215 lb (97.5 kg)     GEN: Well nourished, well developed in no acute distress CARDIAC: RRR, no murmurs, rubs, gallops RESPIRATORY:  Normal work of  breathing MUSCULOSKELETAL: no edema    ASSESSMENT & PLAN:    SVT Appears to be short R-P on monitor He has had frequent PACs in the past I recommended EP study.  If we are able to identify a pathway or induce sustained atrial tachycardia, we will proceed with ablation  We discussed the indication, rationale, logistics, anticipated benefits, and potential risks of the ablation procedure including but not limited to -- bleed at the groin access site, chest pain, damage to nearby organs such as the diaphragm, lungs, or esophagus, need for a drainage tube, or prolonged hospitalization. I explained that the risk for stroke, heart attack, need for open chest surgery, or even death is very low but not zero. he  expressed understanding and wishes to proceed.  CAD s/p DES and CABG  On ASA and plavix. Will need to hold plavix the day prior to the procedure Continue metoprolol            Medication Adjustments/Labs and Tests Ordered: Current medicines are reviewed at length with the patient today.  Concerns regarding medicines are outlined above.  Orders Placed This Encounter  Procedures   EKG 12-Lead   No orders of the defined types were placed in this encounter.    Signed, Maurice Small, MD  06/13/2022 9:28 AM    [facility]

## 2022-06-13 ENCOUNTER — Encounter: Payer: Self-pay | Admitting: Cardiovascular Disease

## 2022-06-13 ENCOUNTER — Other Ambulatory Visit: Payer: Self-pay

## 2022-06-13 ENCOUNTER — Ambulatory Visit: Payer: Managed Care, Other (non HMO) | Attending: Cardiovascular Disease | Admitting: Cardiovascular Disease

## 2022-06-13 VITALS — BP 134/80 | HR 69 | Ht 71.0 in | Wt 220.0 lb

## 2022-06-13 DIAGNOSIS — Z01818 Encounter for other preprocedural examination: Secondary | ICD-10-CM

## 2022-06-13 DIAGNOSIS — I471 Supraventricular tachycardia, unspecified: Secondary | ICD-10-CM | POA: Diagnosis not present

## 2022-06-13 NOTE — Patient Instructions (Addendum)
Medication Instructions:  Your physician recommends that you continue on your current medications as directed. Please refer to the Current Medication list given to you today.  *If you need a refill on your cardiac medications before your next appointment, please call your pharmacy*  Lab Work: We will draw a CBC and BMET, pre-procedure labs within 30 days of your scheduled procedure.    If you have labs (blood work) drawn today and your tests are completely normal, you will receive your results only by: MyChart Message (if you have MyChart) OR A paper copy in the mail If you have any lab test that is abnormal or we need to change your treatment, we will call you to review the results.  Testing/Procedures: None ordered.  Follow-Up: Dr.  York Pellant ordered an EP Study, and SVT Ablation with Carto and MAC Anesthesia, Conscious Sedation; We will reach out to you in the near future to schedule this procedure, and schedule a Lab draw appointment.     ( Pt requested Ultra Sensitive  PSA lab test, Pt states overdue for this.  Requested it be drawn with procedure labs. )    The format for your next appointment:   In Person  Provider:   York Pellant, MD{or one of the following Advanced Practice Providers on your designated Care Team:   Francis Dowse, [place] Casimiro Needle "Mardelle Matte" Lanna Poche, [place]  Cardiac Ablation Cardiac ablation is a procedure to destroy, or ablate, a small amount of heart tissue that is causing problems. The heart has many electrical connections. Sometimes, these connections are abnormal and can cause the heart to beat very fast or irregularly. Ablating the abnormal areas can improve the heart's rhythm or return it to normal. Ablation may be done for people who: Have irregular or rapid heartbeats (arrhythmias). Have Wolff-Parkinson-White syndrome. Have taken medicines for an arrhythmia that did not work or caused side effects. Have a high-risk heartbeat that may be  life-threatening. Tell a health care provider about: Any allergies you have. All medicines you are taking, including vitamins, herbs, eye drops, creams, and over-the-counter medicines. Any problems you or family members have had with anesthesia. Any bleeding problems you have. Any surgeries you have had. Any medical conditions you have. Whether you are pregnant or may be pregnant. What are the risks? Your health care provider will talk with you about risks. These may include: Infection. Bruising and bleeding. Stroke or blood clots. Damage to nearby structures or organs. Allergic reaction to medicines or dyes. Needing a pacemaker if the heart gets damaged. A pacemaker is a device that helps the heart beat normally. Failure of the procedure. A repeat procedure may be needed. What happens before the procedure? Medicines Ask your health care provider about: Changing or stopping your regular medicines. These include any heart rhythm medicines, diabetes medicines, or blood thinners you take. Taking medicines such as aspirin and ibuprofen. These medicines can thin your blood. Do not take them unless your health care provider tells you to. Taking over-the-counter medicines, vitamins, herbs, and supplements. General instructions Follow instructions from your health care provider about what you may eat and drink. If you will be going home right after the procedure, plan to have a responsible adult: Take you home from the hospital or clinic. You will not be allowed to drive. Care for you for the time you are told. Ask your health care provider what steps will be taken to prevent infection. What happens during the procedure?  An IV will be inserted into  one of your veins. You may be given: A sedative. This helps you relax. Anesthesia. This will: Numb certain areas of your body. An incision will be made in your neck or your groin. A needle will be inserted through the incision and into a  large vein in your neck or groin. The small, thin tube (catheter) will be inserted through the needle and moved to your heart. A type of X-ray (fluoroscopy) will be used to help guide the catheter and provide images of the heart on a monitor. Dye may be injected through the catheter to help your surgeon see the area of the heart that needs treatment. Electrical currents will be sent from the catheter to destroy heart tissue in certain areas. There are three types of energy that may be used to do this: Heat (radiofrequency energy). Laser energy. Extreme cold (cryoablation). When the tissue has been destroyed, the catheter will be removed. Pressure will be held on the insertion area to prevent bleeding. A bandage (dressing) will be placed over the insertion area. The procedure may vary among health care providers and hospitals. What happens after the procedure? Your blood pressure, heart rate and rhythm, breathing rate, and blood oxygen level will be monitored until you leave the hospital or clinic. Your insertion area will be checked for bleeding. You will need to lie still for a few hours. If your groin was used, you will need to keep your leg straight for a few hours after the catheter is removed. This information is not intended to replace advice given to you by your health care provider. Make sure you discuss any questions you have with your health care provider. Document Revised: 08/10/2021 Document Reviewed: 08/10/2021 Elsevier Patient Education  2023 [employer].

## 2022-06-29 ENCOUNTER — Other Ambulatory Visit: Payer: Self-pay

## 2022-06-29 DIAGNOSIS — I25110 Atherosclerotic heart disease of native coronary artery with unstable angina pectoris: Secondary | ICD-10-CM

## 2022-06-29 MED ORDER — CLONAZEPAM 1 MG PO TABS: ORAL_TABLET | ORAL | 3 refills | Status: DC

## 2022-07-07 ENCOUNTER — Other Ambulatory Visit: Payer: Self-pay | Admitting: Physician Assistant

## 2022-07-18 ENCOUNTER — Telehealth: Payer: Self-pay

## 2022-07-18 NOTE — Telephone Encounter (Signed)
Pts wife called today to request a same day appointment for the following symptoms:severe abdominal pain around his naval. Leia Alf took the call, she has advised the patient to go to the emergency department due to his symptoms.

## 2022-07-22 ENCOUNTER — Telehealth: Payer: Self-pay

## 2022-07-22 NOTE — Telephone Encounter (Signed)
Attempted to contact patient to move ablation procedure up - left message to call back

## 2022-07-25 NOTE — Telephone Encounter (Signed)
2nd attempt to contact patient to move ablation procedure up - LM to call back

## 2022-07-26 NOTE — Telephone Encounter (Signed)
Pt stated that he has a lot going on and can not move this procedure date. I have made a note on the calendar.

## 2022-07-27 ENCOUNTER — Encounter: Payer: Self-pay | Admitting: Physician Assistant

## 2022-07-27 ENCOUNTER — Ambulatory Visit (INDEPENDENT_AMBULATORY_CARE_PROVIDER_SITE_OTHER): Payer: Managed Care, Other (non HMO) | Admitting: Physician Assistant

## 2022-07-27 VITALS — BP 102/60 | HR 68 | Temp 98.7°F | Ht 71.0 in | Wt 227.0 lb

## 2022-07-27 DIAGNOSIS — I10 Essential (primary) hypertension: Secondary | ICD-10-CM | POA: Diagnosis not present

## 2022-07-27 DIAGNOSIS — E782 Mixed hyperlipidemia: Secondary | ICD-10-CM | POA: Diagnosis not present

## 2022-07-27 DIAGNOSIS — H6120 Impacted cerumen, unspecified ear: Secondary | ICD-10-CM | POA: Insufficient documentation

## 2022-07-27 DIAGNOSIS — C61 Malignant neoplasm of prostate: Secondary | ICD-10-CM | POA: Diagnosis not present

## 2022-07-27 DIAGNOSIS — E538 Deficiency of other specified B group vitamins: Secondary | ICD-10-CM

## 2022-07-27 DIAGNOSIS — H6122 Impacted cerumen, left ear: Secondary | ICD-10-CM

## 2022-07-27 NOTE — Assessment & Plan Note (Signed)
Well controlled.  Continue to work on eating a healthy diet and exercise.  Labs drawn today.   No major side effects reported, and no issues with compliance. The current medical regimen is effective;  continue present plan  

## 2022-07-27 NOTE — Assessment & Plan Note (Signed)
Assessing PSA levels to monitor previous history of prostate cancer.

## 2022-07-27 NOTE — Progress Notes (Signed)
Acute Office Visit  Subjective:    Patient ID: Jacob Collier, male    DOB: [birth year], 67 y.o.   MRN: [id number]  Chief Complaint  Patient presents with   Ear Pain    HPI: Patient is in today for left ear pain since 5 days ago. He noticed some discharge and fullness. He took amoxicillin once a day for 4 days and it helps a little bit. He denied chest pain, sob, sore throat, sinus pain. Denies fever, sinus pain. Patient states it started on Friday or Saturday. Patient has been taking Claritin as well.   Past Medical History:  Diagnosis Date   Anxiety    Arthritis    Coronary artery disease    a. S/p DES to RCA in 01/2019 b. s/p CABG x4 (LIMA-LAD, reverse SVG-PDA, reverse SVG-OM1, reverse SVG-1st Diag) in 01/2021   Diverticulosis    Dyspnea    Hard of hearing    Hyperlipidemia    Hypertension    hx of elevation on lyrica, off med and now normal    Paroxysmal SVT (supraventricular tachycardia)    Post-op atrial fibrillation    Prostate cancer (HCC)    Wears glasses     Past Surgical History:  Procedure Laterality Date   ANTERIOR CERVICAL DECOMP/DISCECTOMY FUSION N/A 09/03/2021   Procedure: CERVICAL FIVE-SIX, CERVICAL SIX-SEVEN ANTERIOR CERVICAL DECOMPRESSION/DISCECTOMY FUSION;  Surgeon: Julio Sicks, MD;  Location: [facility] OR;  Service: Neurosurgery;  Laterality: N/A;   CARDIAC CATHETERIZATION     with 2 stents    COLECTOMY  05/2018   COLONOSCOPY     CORONARY ARTERY BYPASS GRAFT N/A 01/19/2021   Procedure: CORONARY ARTERY BYPASS GRAFTING (CABG), ON PUMP TIMES FOUR, USING LEFT INTERNAL MAMMARY ARTERY AND ENDOSOCPICALLY HARVESTED RIGHT GREATER SAPHENOUS VEIN;  Surgeon: Corliss Skains, MD;  Location: [facility] OR;  Service: Open Heart Surgery;  Laterality: N/A;   CYSTOSCOPY WITH URETHRAL DILATATION N/A 01/14/2021   Procedure: CYSTOSCOPY WITH BALLOON  DILATATION;  Surgeon: Alfredo Martinez, MD;  Location: [facility] ORS;  Service: Urology;  Laterality: N/A;   EYE SURGERY  02/2020    bilaterl cataracts   IR THORACENTESIS ASP PLEURAL SPACE W/IMG GUIDE  02/02/2021   LEFT HEART CATH AND CORONARY ANGIOGRAPHY N/A 12/24/2020   Procedure: LEFT HEART CATH AND CORONARY ANGIOGRAPHY;  Surgeon: Swaziland, Peter M, MD;  Location: [facility] INVASIVE CV LAB;  Service: Cardiovascular;  Laterality: N/A;   PROSTATECTOMY     TEE WITHOUT CARDIOVERSION N/A 01/19/2021   Procedure: TRANSESOPHAGEAL ECHOCARDIOGRAM (TEE);  Surgeon: Corliss Skains, MD;  Location: [facility] OR;  Service: Open Heart Surgery;  Laterality: N/A;   TOTAL HIP ARTHROPLASTY Left 11/10/2015   Procedure: LEFT TOTAL HIP ARTHROPLASTY ANTERIOR APPROACH;  Surgeon: Kathryne Hitch, MD;  Location: [facility] OR;  Service: Orthopedics;  Laterality: Left;    Family History  Problem Relation Age of Onset   Alzheimer's disease Mother    Stroke Father    Diabetes Sister    Heart attack Maternal Grandmother    Cancer Maternal Uncle        prostate   Prostate cancer Maternal Uncle    Cancer Maternal Uncle        prostate   Prostate cancer Maternal Uncle    Colon polyps Brother    Colon cancer Neg Hx    Esophageal cancer Neg Hx    Rectal cancer Neg Hx    Stomach cancer Neg Hx     Social History   Socioeconomic History   Marital  status: Married    Spouse name: Not on file   Number of children: 0   Years of education: Not on file   Highest education level: Not on file  Occupational History   Occupation: Employed at [employer]  Tobacco Use   Smoking status: Never   Smokeless tobacco: Never  Vaping Use   Vaping Use: Never used  Substance and Sexual Activity   Alcohol use: Not Currently    Alcohol/week: 0.0 standard drinks of alcohol    Comment: rarely   Drug use: No   Sexual activity: Yes    Partners: Female    Birth control/protection: None  Other Topics Concern   Not on file  Social History Narrative   Not on file   Social Determinants of Health   Financial Resource Strain: Not on file  Food Insecurity: Not on file   Transportation Needs: Not on file  Physical Activity: Not on file  Stress: Not on file  Social Connections: Not on file  Intimate Partner Violence: Not on file    Outpatient Medications Prior to Visit  Medication Sig Dispense Refill   acetaminophen (TYLENOL) 500 MG tablet Take 1,000 mg by mouth every 8 (eight) hours as needed for mild pain.     amLODipine (NORVASC) 5 MG tablet Take 1 tablet (5 mg total) by mouth at bedtime. 90 tablet 3   aspirin EC 81 MG tablet Take 81 mg by mouth daily. Swallow whole.     clonazePAM (KLONOPIN) 1 MG tablet TAKE 1 TABLET BY MOUTH TWICE A DAY AS NEEDED FOR ANXIETY 60 tablet 3   clopidogrel (PLAVIX) 75 MG tablet TAKE 1 TABLET (75 MG TOTAL) BY MOUTH DAILY. STOP TAKING PLAVIX ON SATURDAY NOV. 5, 2022 90 tablet 3   cyanocobalamin (VITAMIN B12) 1000 MCG tablet Take 1 tablet (1,000 mcg total) by mouth daily. 90 tablet 1   Eszopiclone 3 MG TABS TAKE 1 TABLET BY MOUTH EVERYDAY AT BEDTIME 30 tablet 2   Evolocumab (REPATHA SURECLICK) 140 MG/ML SOAJ INJECT 140 MG INTO THE SKIN EVERY 14 (FOURTEEN) DAYS. 6 mL 3   ezetimibe (ZETIA) 10 MG tablet TAKE 1 TABLET BY MOUTH EVERY DAY 90 tablet 3   methylPREDNISolone (MEDROL DOSEPAK) 4 MG TBPK tablet Take with foodas directed 21 tablet 0   metoprolol succinate (TOPROL-XL) 25 MG 24 hr tablet TAKE 0.5 TABLET BY MOUTH DAILY. TAKE WITH OR IMMEDIATELY FOLLOWING A MEAL. 45 tablet 2   Omega-3 Fatty Acids (OMEGA 3 PO) Take 1 capsule by mouth 2 (two) times daily.     Vitamin D, Ergocalciferol, (DRISDOL) 1.25 MG (50000 UNIT) CAPS capsule Take 1 capsule (50,000 Units total) by mouth every 7 (seven) days. 5 capsule 3   No facility-administered medications prior to visit.    Allergies  Allergen Reactions   Ranolazine Other (See Comments)    Chest discomfort/Reflux   Atorvastatin Other (See Comments)    myalgias  Other reaction(s): Myalgias (intolerance)   Pravastatin Other (See Comments)    myalgias  Other reaction(s): Myalgias  (intolerance)   Rosuvastatin Other (See Comments)    myalgias  Other reaction(s): Myalgias (intolerance)   Statins Other (See Comments)    myalgias    Review of Systems  Constitutional:  Negative for chills, fatigue, fever and unexpected weight change.  HENT:  Positive for ear pain (Left ear). Negative for congestion, sinus pain and sore throat.   Respiratory:  Negative for cough and shortness of breath.   Cardiovascular:  Negative for chest pain  and palpitations.  Gastrointestinal:  Negative for abdominal pain, blood in stool, constipation, diarrhea, nausea and vomiting.  Endocrine: Negative for polydipsia.  Genitourinary:  Negative for dysuria.  Musculoskeletal:  Negative for back pain.  Skin:  Negative for rash.  Neurological:  Negative for headaches.       Objective:        07/27/2022    3:07 PM 06/13/2022    9:25 AM 05/14/2022    5:20 PM  Vitals with BMI  Height 5\' 11"  5\' 11"    Weight 227 lbs 220 lbs   BMI 31.67 30.7   Systolic 102 134   Diastolic 60 80   Pulse 68 69 54    No data found.   Physical Exam Constitutional:      Appearance: Normal appearance.  HENT:     Right Ear: Tympanic membrane normal.     Left Ear: Tympanic membrane normal. There is impacted cerumen.     Nose: Nose normal.  Eyes:     Conjunctiva/sclera: Conjunctivae normal.  Cardiovascular:     Rate and Rhythm: Normal rate and regular rhythm.     Heart sounds: Normal heart sounds.  Pulmonary:     Effort: Pulmonary effort is normal.     Breath sounds: Normal breath sounds.  Abdominal:     General: Bowel sounds are normal.     Palpations: Abdomen is soft.     Tenderness: There is no abdominal tenderness.  Skin:    Findings: No lesion or rash.  Neurological:     Mental Status: He is alert and oriented to person, place, and time.  Psychiatric:        Behavior: Behavior normal.     Health Maintenance Due  Topic Date Due   Medicare Annual Wellness (AWV)  Never done   DTaP/Tdap/Td (1  - Tdap) Never done   Zoster Vaccines- Shingrix (1 of 2) Never done   Pneumonia Vaccine 74+ Years old (1 of 1 - PCV) Never done    There are no preventive care reminders to display for this patient.   Lab Results  Component Value Date   TSH 1.570 04/04/2022   Lab Results  Component Value Date   WBC 15.4 (H) 05/14/2022   HGB 17.3 (H) 05/14/2022   HCT 51.2 05/14/2022   MCV 88.1 05/14/2022   PLT 163 05/14/2022   Lab Results  Component Value Date   NA 137 05/14/2022   K 3.9 05/14/2022   CO2 21 (L) 05/14/2022   GLUCOSE 166 (H) 05/14/2022   BUN 24 (H) 05/14/2022   CREATININE 0.94 05/14/2022   BILITOT 0.8 04/25/2022   ALKPHOS 80 04/25/2022   AST 28 04/25/2022   ALT 32 04/25/2022   PROT 7.1 04/25/2022   ALBUMIN 4.5 04/25/2022   CALCIUM 8.8 (L) 05/14/2022   ANIONGAP 9 05/14/2022   EGFR 84 04/25/2022   Lab Results  Component Value Date   CHOL 87 (L) 04/25/2022   Lab Results  Component Value Date   HDL 29 (L) 04/25/2022   Lab Results  Component Value Date   LDLCALC 37 04/25/2022   Lab Results  Component Value Date   TRIG 115 04/25/2022   Lab Results  Component Value Date   CHOLHDL 3.0 04/25/2022   Lab Results  Component Value Date   HGBA1C 5.8 (H) 03/04/2021       Assessment & Plan:  Prostate cancer [facility]) Assessment & Plan: Assessing PSA levels to monitor previous history of prostate cancer.  Orders: -  PSA, total and free  Vitamin B12 deficiency Assessment & Plan: Assessment levels today for previous history of deficiency.  Orders: -     B12 and Folate Panel  Mixed hyperlipidemia Assessment & Plan: Well controlled.  Continue to work on eating a healthy diet and exercise.  Labs drawn today.   No major side effects reported, and no issues with compliance. The current medical regimen is effective;  continue present plan    Orders: -     Comprehensive metabolic panel -     Lipid panel  Primary hypertension Assessment & Plan: Well  controlled.  Continue to work on eating a healthy diet and exercise.  Labs drawn today.   No major side effects reported, and no issues with compliance. The current medical regimen is effective;  continue present plan   Orders: -     CBC with Differential/Platelet  Impacted cerumen of left ear Assessment & Plan: Ears were cleaned thoroughly with warm saline and normal water.  Tympanic membrane was inspected after with no signs of perforation no edema or erythema.      No orders of the defined types were placed in this encounter.   Orders Placed This Encounter  Procedures   B12 and Folate Panel   CBC with Differential/Platelet   Comprehensive metabolic panel   Lipid panel   PSA, total and free     Follow-up: No follow-ups on file.  An After Visit Summary was printed and given to the patient.  Langley Gauss, [name] [phone]

## 2022-07-27 NOTE — Assessment & Plan Note (Signed)
Ears were cleaned thoroughly with warm saline and normal water.  Tympanic membrane was inspected after with no signs of perforation no edema or erythema.

## 2022-07-27 NOTE — Assessment & Plan Note (Signed)
Assessment levels today for previous history of deficiency.

## 2022-07-28 ENCOUNTER — Other Ambulatory Visit: Payer: Self-pay | Admitting: Physician Assistant

## 2022-07-28 DIAGNOSIS — R899 Unspecified abnormal finding in specimens from other organs, systems and tissues: Secondary | ICD-10-CM

## 2022-07-28 LAB — LIPID PANEL
Chol/HDL Ratio: 2.2 ratio (ref 0.0–5.0)
Cholesterol, Total: 84 mg/dL — ABNORMAL LOW (ref 100–199)
HDL: 39 mg/dL — ABNORMAL LOW (ref >39)
LDL Chol Calc (NIH): 22 mg/dL (ref 0–99)
Triglycerides: 132 mg/dL (ref 0–149)
VLDL Cholesterol Cal: 23 mg/dL (ref 5–40)

## 2022-07-28 LAB — COMPREHENSIVE METABOLIC PANEL
ALT: 40 IU/L (ref 0–44)
AST: 29 IU/L (ref 0–40)
Albumin/Globulin Ratio: 1.9 (ref 1.2–2.2)
Albumin: 4.4 g/dL (ref 3.9–4.9)
Alkaline Phosphatase: 81 IU/L (ref 44–121)
BUN/Creatinine Ratio: 16 (ref 10–24)
BUN: 20 mg/dL (ref 8–27)
Bilirubin Total: 0.6 mg/dL (ref 0.0–1.2)
CO2: 20 mmol/L (ref 20–29)
Calcium: 9.6 mg/dL (ref 8.6–10.2)
Chloride: 102 mmol/L (ref 96–106)
Creatinine, Ser: 1.28 mg/dL — ABNORMAL HIGH (ref 0.76–1.27)
Globulin, Total: 2.3 g/dL (ref 1.5–4.5)
Glucose: 77 mg/dL (ref 70–99)
Potassium: 4.4 mmol/L (ref 3.5–5.2)
Sodium: 138 mmol/L (ref 134–144)
Total Protein: 6.7 g/dL (ref 6.0–8.5)
eGFR: 61 mL/min/{1.73_m2} (ref >59)

## 2022-07-28 LAB — CBC WITH DIFFERENTIAL/PLATELET
Basophils Absolute: 0.1 10*3/uL (ref 0.0–0.2)
Basos: 1 %
EOS (ABSOLUTE): 0.2 10*3/uL (ref 0.0–0.4)
Eos: 2 %
Hematocrit: 55.9 % — ABNORMAL HIGH (ref 37.5–51.0)
Hemoglobin: 19 g/dL — ABNORMAL HIGH (ref 13.0–17.7)
Immature Grans (Abs): 0.1 10*3/uL (ref 0.0–0.1)
Immature Granulocytes: 1 %
Lymphocytes Absolute: 2.1 10*3/uL (ref 0.7–3.1)
Lymphs: 25 %
MCH: 30.4 pg (ref 26.6–33.0)
MCHC: 34 g/dL (ref 31.5–35.7)
MCV: 89 fL (ref 79–97)
Monocytes Absolute: 0.6 10*3/uL (ref 0.1–0.9)
Monocytes: 7 %
Neutrophils Absolute: 5.4 10*3/uL (ref 1.4–7.0)
Neutrophils: 64 %
Platelets: 223 10*3/uL (ref 150–450)
RBC: 6.26 x10E6/uL — ABNORMAL HIGH (ref 4.14–5.80)
RDW: 14.5 % (ref 11.6–15.4)
WBC: 8.4 10*3/uL (ref 3.4–10.8)

## 2022-07-28 LAB — B12 AND FOLATE PANEL
Folate: 14.2 ng/mL (ref >3.0)
Vitamin B-12: 1979 pg/mL — ABNORMAL HIGH (ref 232–1245)

## 2022-07-28 LAB — PSA, TOTAL AND FREE
PSA, Free Pct: <10 %
PSA, Free: <0.02 ng/mL
Prostate Specific Ag, Serum: 0.2 ng/mL (ref 0.0–4.0)

## 2022-07-28 LAB — CARDIOVASCULAR RISK ASSESSMENT

## 2022-08-04 ENCOUNTER — Telehealth: Payer: Self-pay

## 2022-08-04 ENCOUNTER — Ambulatory Visit (INDEPENDENT_AMBULATORY_CARE_PROVIDER_SITE_OTHER): Payer: Managed Care, Other (non HMO) | Admitting: Family Medicine

## 2022-08-04 VITALS — BP 128/72 | HR 63 | Ht 71.0 in | Wt 229.0 lb

## 2022-08-04 DIAGNOSIS — D582 Other hemoglobinopathies: Secondary | ICD-10-CM

## 2022-08-04 DIAGNOSIS — R400 Somnolence: Secondary | ICD-10-CM

## 2022-08-04 DIAGNOSIS — R29898 Other symptoms and signs involving the musculoskeletal system: Secondary | ICD-10-CM

## 2022-08-04 DIAGNOSIS — R0683 Snoring: Secondary | ICD-10-CM | POA: Diagnosis not present

## 2022-08-04 DIAGNOSIS — R5383 Other fatigue: Secondary | ICD-10-CM | POA: Diagnosis not present

## 2022-08-04 LAB — POCT URINALYSIS DIP (CLINITEK)
Bilirubin, UA: NEGATIVE
Blood, UA: NEGATIVE
Glucose, UA: NEGATIVE mg/dL
Ketones, POC UA: NEGATIVE mg/dL
Leukocytes, UA: NEGATIVE
Nitrite, UA: NEGATIVE
POC PROTEIN,UA: NEGATIVE
Spec Grav, UA: 1.01 (ref 1.010–1.025)
Urobilinogen, UA: 0.2 E.U./dL
pH, UA: 6 (ref 5.0–8.0)

## 2022-08-04 NOTE — Telephone Encounter (Addendum)
Patient called stating that he has had no energy for the past 3-4 days. He states his wife has been having to help him get out of bed to get up in the mornings. He states that the past two days he has not been able to work neither. I asked patient about his pulse and his blood pressure and he states both have been great. He also states that he is still waiting to go back to see Dr. Dutch Quint his back doctor to discuss other options since insurance will not pay for epidural to be done on the patient. Patient wanted to schedule an appointment with [place] because he's not feeling good but is concerned about his CBC that was done and his RBC count being high. He recently had lots of blood work done on 07/27/22 if you need to reference back to it. Please advise.

## 2022-08-04 NOTE — Progress Notes (Signed)
Acute Office Visit  Subjective:    Patient ID: Jacob Collier, male    DOB: 1955/08/13, 67 y.o.   MRN: [id number]  Chief Complaint  Patient presents with   Fatigue    HPI: Patient is in today for fatigue/weak x 2-3 days. No sinus symptoms, states his arms, legs are weak, feels exhausted. States his CBC was elevated. WBC 8.4, RBC 6.26, HGB 19.0, Hematocrit 55.9  Past Medical History:  Diagnosis Date   Anxiety    Arthritis    Coronary artery disease    a. S/p DES to RCA in 01/2019 b. s/p CABG x4 (LIMA-LAD, reverse SVG-PDA, reverse SVG-OM1, reverse SVG-1st Diag) in 01/2021   Diverticulosis    Dyspnea    Hard of hearing    Hyperlipidemia    Hypertension    hx of elevation on lyrica, off med and now normal    Paroxysmal SVT (supraventricular tachycardia)    Post-op atrial fibrillation    Prostate cancer (HCC)    Wears glasses     Past Surgical History:  Procedure Laterality Date   ANTERIOR CERVICAL DECOMP/DISCECTOMY FUSION N/A 09/03/2021   Procedure: CERVICAL FIVE-SIX, CERVICAL SIX-SEVEN ANTERIOR CERVICAL DECOMPRESSION/DISCECTOMY FUSION;  Surgeon: Julio Sicks, MD;  Location: [facility] OR;  Service: Neurosurgery;  Laterality: N/A;   CARDIAC CATHETERIZATION     with 2 stents    COLECTOMY  05/2018   COLONOSCOPY     CORONARY ARTERY BYPASS GRAFT N/A 01/19/2021   Procedure: CORONARY ARTERY BYPASS GRAFTING (CABG), ON PUMP TIMES FOUR, USING LEFT INTERNAL MAMMARY ARTERY AND ENDOSOCPICALLY HARVESTED RIGHT GREATER SAPHENOUS VEIN;  Surgeon: Corliss Skains, MD;  Location: [facility] OR;  Service: Open Heart Surgery;  Laterality: N/A;   CYSTOSCOPY WITH URETHRAL DILATATION N/A 01/14/2021   Procedure: CYSTOSCOPY WITH BALLOON  DILATATION;  Surgeon: Alfredo Martinez, MD;  Location: [facility] ORS;  Service: Urology;  Laterality: N/A;   EYE SURGERY  02/2020   bilaterl cataracts   IR THORACENTESIS ASP PLEURAL SPACE W/IMG GUIDE  02/02/2021   LEFT HEART CATH AND CORONARY ANGIOGRAPHY N/A 12/24/2020    Procedure: LEFT HEART CATH AND CORONARY ANGIOGRAPHY;  Surgeon: Swaziland, Peter M, MD;  Location: [facility] INVASIVE CV LAB;  Service: Cardiovascular;  Laterality: N/A;   PROSTATECTOMY     TEE WITHOUT CARDIOVERSION N/A 01/19/2021   Procedure: TRANSESOPHAGEAL ECHOCARDIOGRAM (TEE);  Surgeon: Corliss Skains, MD;  Location: [facility] OR;  Service: Open Heart Surgery;  Laterality: N/A;   TOTAL HIP ARTHROPLASTY Left 11/10/2015   Procedure: LEFT TOTAL HIP ARTHROPLASTY ANTERIOR APPROACH;  Surgeon: Kathryne Hitch, MD;  Location: [facility] OR;  Service: Orthopedics;  Laterality: Left;    Family History  Problem Relation Age of Onset   Alzheimer's disease Mother    Stroke Father    Diabetes Sister    Heart attack Maternal Grandmother    Cancer Maternal Uncle        prostate   Prostate cancer Maternal Uncle    Cancer Maternal Uncle        prostate   Prostate cancer Maternal Uncle    Colon polyps Brother    Colon cancer Neg Hx    Esophageal cancer Neg Hx    Rectal cancer Neg Hx    Stomach cancer Neg Hx     Social History   Socioeconomic History   Marital status: Married    Spouse name: Not on file   Number of children: 0   Years of education: Not on file   Highest education level: Not  on file  Occupational History   Occupation: Employed at [employer]  Tobacco Use   Smoking status: Never   Smokeless tobacco: Never  Vaping Use   Vaping Use: Never used  Substance and Sexual Activity   Alcohol use: Not Currently    Alcohol/week: 0.0 standard drinks of alcohol    Comment: rarely   Drug use: No   Sexual activity: Yes    Partners: Female    Birth control/protection: None  Other Topics Concern   Not on file  Social History Narrative   Not on file   Social Determinants of Health   Financial Resource Strain: Low Risk  (08/04/2022)   Overall Financial Resource Strain (CARDIA)    Difficulty of Paying Living Expenses: Not hard at all  Food Insecurity: No Food Insecurity (08/04/2022)   Hunger  Vital Sign    Worried About Running Out of Food in the Last Year: Never true    Ran Out of Food in the Last Year: Never true  Transportation Needs: No Transportation Needs (08/04/2022)   PRAPARE - Administrator, Civil Service (Medical): No    Lack of Transportation (Non-Medical): No  Physical Activity: Sufficiently Active (08/04/2022)   Exercise Vital Sign    Days of Exercise per Week: 5 days    Minutes of Exercise per Session: 60 min  Stress: No Stress Concern Present (08/04/2022)   [employer] of Occupational Health - Occupational Stress Questionnaire    Feeling of Stress : Not at all  Social Connections: Moderately Isolated (08/04/2022)   Social Connection and Isolation Panel [NHANES]    Frequency of Communication with Friends and Family: More than three times a week    Frequency of Social Gatherings with Friends and Family: More than three times a week    Attends Religious Services: Never    Database administrator or Organizations: No    Attends Banker Meetings: Never    Marital Status: Married  Catering manager Violence: Not At Risk (08/04/2022)   Humiliation, Afraid, Rape, and Kick questionnaire    Fear of Current or Ex-Partner: No    Emotionally Abused: No    Physically Abused: No    Sexually Abused: No    Outpatient Medications Prior to Visit  Medication Sig Dispense Refill   acetaminophen (TYLENOL) 500 MG tablet Take 1,000 mg by mouth every 8 (eight) hours as needed for mild pain.     amLODipine (NORVASC) 5 MG tablet Take 1 tablet (5 mg total) by mouth at bedtime. 90 tablet 3   aspirin EC 81 MG tablet Take 81 mg by mouth daily. Swallow whole.     clonazePAM (KLONOPIN) 1 MG tablet TAKE 1 TABLET BY MOUTH TWICE A DAY AS NEEDED FOR ANXIETY 60 tablet 3   clopidogrel (PLAVIX) 75 MG tablet TAKE 1 TABLET (75 MG TOTAL) BY MOUTH DAILY. STOP TAKING PLAVIX ON SATURDAY NOV. 5, 2022 90 tablet 3   cyanocobalamin (VITAMIN B12) 1000 MCG tablet Take 1 tablet  (1,000 mcg total) by mouth daily. 90 tablet 1   Eszopiclone 3 MG TABS TAKE 1 TABLET BY MOUTH EVERYDAY AT BEDTIME 30 tablet 2   Evolocumab (REPATHA SURECLICK) 140 MG/ML SOAJ INJECT 140 MG INTO THE SKIN EVERY 14 (FOURTEEN) DAYS. 6 mL 3   ezetimibe (ZETIA) 10 MG tablet TAKE 1 TABLET BY MOUTH EVERY DAY 90 tablet 3   metoprolol succinate (TOPROL-XL) 25 MG 24 hr tablet TAKE 0.5 TABLET BY MOUTH DAILY. TAKE WITH OR IMMEDIATELY  FOLLOWING A MEAL. 45 tablet 2   Omega-3 Fatty Acids (OMEGA 3 PO) Take 1 capsule by mouth 2 (two) times daily.     Vitamin D, Ergocalciferol, (DRISDOL) 1.25 MG (50000 UNIT) CAPS capsule Take 1 capsule (50,000 Units total) by mouth every 7 (seven) days. 5 capsule 3   methylPREDNISolone (MEDROL DOSEPAK) 4 MG TBPK tablet Take with foodas directed 21 tablet 0   No facility-administered medications prior to visit.    Allergies  Allergen Reactions   Ranolazine Other (See Comments)    Chest discomfort/Reflux   Atorvastatin Other (See Comments)    myalgias  Other reaction(s): Myalgias (intolerance)   Pravastatin Other (See Comments)    myalgias  Other reaction(s): Myalgias (intolerance)   Rosuvastatin Other (See Comments)    myalgias  Other reaction(s): Myalgias (intolerance)   Statins Other (See Comments)    myalgias    Review of Systems  Constitutional:  Positive for fatigue. Negative for chills and fever.  HENT:  Negative for congestion, ear pain, postnasal drip, rhinorrhea, sinus pressure, sinus pain and sore throat.   Respiratory:  Negative for cough and shortness of breath.   Cardiovascular:  Negative for chest pain.  Gastrointestinal:  Negative for diarrhea, nausea and vomiting.  Neurological:  Positive for weakness. Negative for dizziness and headaches.       Objective:        08/04/2022    4:11 PM 07/27/2022    3:07 PM 06/13/2022    9:25 AM  Vitals with BMI  Height 5\' 11"  5\' 11"  5\' 11"   Weight 229 lbs 227 lbs 220 lbs  BMI 31.95 31.67 30.7  Systolic 128  102 134  Diastolic 72 60 80  Pulse 63 68 69    No data found.   Physical Exam Vitals reviewed.  Constitutional:      Appearance: Normal appearance.  Cardiovascular:     Rate and Rhythm: Normal rate and regular rhythm.     Heart sounds: Normal heart sounds.  Pulmonary:     Effort: Pulmonary effort is normal.     Breath sounds: Normal breath sounds. No wheezing, rhonchi or rales.  Abdominal:     General: Bowel sounds are normal.     Palpations: Abdomen is soft.     Tenderness: There is no abdominal tenderness.  Neurological:     Mental Status: He is alert.     Motor: No weakness.     Gait: Gait normal.  Psychiatric:        Mood and Affect: Mood normal.        Behavior: Behavior normal.     Health Maintenance Due  Topic Date Due   Medicare Annual Wellness (AWV)  Never done   DTaP/Tdap/Td (1 - Tdap) Never done   Zoster Vaccines- Shingrix (1 of 2) Never done   Pneumonia Vaccine 2+ Years old (1 of 1 - PCV) Never done    There are no preventive care reminders to display for this patient.   Lab Results  Component Value Date   TSH 1.570 04/04/2022   Lab Results  Component Value Date   WBC 7.4 08/04/2022   HGB 19.4 (H) 08/04/2022   HCT 59.0 (H) 08/04/2022   MCV 90 08/04/2022   PLT 225 08/04/2022   Lab Results  Component Value Date   NA 143 08/04/2022   K 4.6 08/04/2022   CO2 22 08/04/2022   GLUCOSE 101 (H) 08/04/2022   BUN 13 08/04/2022   CREATININE 1.16 08/04/2022   BILITOT  0.3 08/04/2022   ALKPHOS 82 08/04/2022   AST 26 08/04/2022   ALT 36 08/04/2022   PROT 6.8 08/04/2022   ALBUMIN 4.2 08/04/2022   CALCIUM 9.2 08/04/2022   ANIONGAP 9 05/14/2022   EGFR 69 08/04/2022   Lab Results  Component Value Date   CHOL 84 (L) 07/27/2022   Lab Results  Component Value Date   HDL 39 (L) 07/27/2022   Lab Results  Component Value Date   LDLCALC 22 07/27/2022   Lab Results  Component Value Date   TRIG 132 07/27/2022   Lab Results  Component Value Date    CHOLHDL 2.2 07/27/2022   Lab Results  Component Value Date   HGBA1C 5.8 (H) 03/04/2021       Assessment & Plan:  Other fatigue Assessment & Plan: Check for rapid covid Check UA Labs drawn today.  Orders: -     POC COVID-19 BinaxNow -     CBC with Differential/Platelet -     Comprehensive metabolic panel -     POCT URINALYSIS DIP (CLINITEK)  Weakness of both lower extremities Assessment & Plan: Check labs  Orders: -     Sedimentation rate -     C-reactive protein  Elevated hemoglobin (HCC) Assessment & Plan: Home sleep study was ordered.  Orders: -     Home sleep test; Future  Snores Assessment & Plan: Home sleep study was ordered.  Orders: -     Home sleep test; Future  Daytime somnolence Assessment & Plan: Home sleep study was ordered.  Orders: -     Home sleep test; Future     No orders of the defined types were placed in this encounter.   Orders Placed This Encounter  Procedures   CBC with Differential/Platelet   Comprehensive metabolic panel   Sedimentation Rate   C-reactive protein   POC COVID-19 BinaxNow   POCT URINALYSIS DIP (CLINITEK)   Home sleep test     Follow-up: Return if symptoms worsen or fail to improve.  An After Visit Summary was printed and given to the patient.   Clayborn Bigness I Leal-Borjas,acting as a scribe for Blane Ohara, MD.,have documented all relevant documentation on the behalf of Blane Ohara, MD,as directed by  Blane Ohara, MD while in the presence of Blane Ohara, MD.   Blane Ohara, MD [facility] [phone]

## 2022-08-04 NOTE — Telephone Encounter (Signed)
Patient informed, patient has been scheduled to come in.

## 2022-08-05 ENCOUNTER — Encounter: Payer: Self-pay | Admitting: Family Medicine

## 2022-08-05 ENCOUNTER — Telehealth: Payer: Self-pay | Admitting: *Deleted

## 2022-08-05 DIAGNOSIS — R29898 Other symptoms and signs involving the musculoskeletal system: Secondary | ICD-10-CM | POA: Insufficient documentation

## 2022-08-05 DIAGNOSIS — R0683 Snoring: Secondary | ICD-10-CM | POA: Insufficient documentation

## 2022-08-05 DIAGNOSIS — D582 Other hemoglobinopathies: Secondary | ICD-10-CM | POA: Insufficient documentation

## 2022-08-05 DIAGNOSIS — R400 Somnolence: Secondary | ICD-10-CM | POA: Insufficient documentation

## 2022-08-05 LAB — CBC WITH DIFFERENTIAL/PLATELET
Basophils Absolute: 0.1 10*3/uL (ref 0.0–0.2)
Basos: 1 %
EOS (ABSOLUTE): 0.3 10*3/uL (ref 0.0–0.4)
Eos: 4 %
Hematocrit: 59 % — ABNORMAL HIGH (ref 37.5–51.0)
Hemoglobin: 19.4 g/dL — ABNORMAL HIGH (ref 13.0–17.7)
Immature Grans (Abs): 0.1 10*3/uL (ref 0.0–0.1)
Immature Granulocytes: 1 %
Lymphocytes Absolute: 2 10*3/uL (ref 0.7–3.1)
Lymphs: 26 %
MCH: 29.7 pg (ref 26.6–33.0)
MCHC: 32.9 g/dL (ref 31.5–35.7)
MCV: 90 fL (ref 79–97)
Monocytes Absolute: 0.5 10*3/uL (ref 0.1–0.9)
Monocytes: 7 %
Neutrophils Absolute: 4.6 10*3/uL (ref 1.4–7.0)
Neutrophils: 61 %
Platelets: 225 10*3/uL (ref 150–450)
RBC: 6.54 x10E6/uL — ABNORMAL HIGH (ref 4.14–5.80)
RDW: 14.9 % (ref 11.6–15.4)
WBC: 7.4 10*3/uL (ref 3.4–10.8)

## 2022-08-05 LAB — COMPREHENSIVE METABOLIC PANEL
ALT: 36 IU/L (ref 0–44)
AST: 26 IU/L (ref 0–40)
Albumin/Globulin Ratio: 1.6 (ref 1.2–2.2)
Albumin: 4.2 g/dL (ref 3.9–4.9)
Alkaline Phosphatase: 82 IU/L (ref 44–121)
BUN/Creatinine Ratio: 11 (ref 10–24)
BUN: 13 mg/dL (ref 8–27)
Bilirubin Total: 0.3 mg/dL (ref 0.0–1.2)
CO2: 22 mmol/L (ref 20–29)
Calcium: 9.2 mg/dL (ref 8.6–10.2)
Chloride: 106 mmol/L (ref 96–106)
Creatinine, Ser: 1.16 mg/dL (ref 0.76–1.27)
Globulin, Total: 2.6 g/dL (ref 1.5–4.5)
Glucose: 101 mg/dL — ABNORMAL HIGH (ref 70–99)
Potassium: 4.6 mmol/L (ref 3.5–5.2)
Sodium: 143 mmol/L (ref 134–144)
Total Protein: 6.8 g/dL (ref 6.0–8.5)
eGFR: 69 mL/min/{1.73_m2} (ref >59)

## 2022-08-05 LAB — C-REACTIVE PROTEIN: CRP: 2 mg/L (ref 0–10)

## 2022-08-05 LAB — SEDIMENTATION RATE: Sed Rate: 16 mm/hr (ref 0–30)

## 2022-08-05 LAB — POC COVID19 BINAXNOW: SARS Coronavirus 2 Ag: NEGATIVE

## 2022-08-05 NOTE — Assessment & Plan Note (Signed)
Check labs 

## 2022-08-05 NOTE — Assessment & Plan Note (Signed)
Home sleep study was ordered. °

## 2022-08-05 NOTE — Assessment & Plan Note (Signed)
Check for rapid covid Check UA Labs drawn today.

## 2022-08-05 NOTE — Telephone Encounter (Signed)
Received call from pt. He states he had labs done @ PCP office and his HGB is 19.4 and HCT is 59. His PCP wants him seen here again. Pt was last here in January 2023 for the same issue. Pt has not had sleep study done  yet. His polycythemia is likely due to sleep apnea and needs to have this done. He is not a candidate for therapeutic phlebotomy. TCT back to patient. Advised that he does not need an appointment here but needs to get the sleep study done. Explained the issue with sleep apnea and his body's response to that. He voiced understanding. His PCP has resent in the referral for a sleep study.

## 2022-08-19 ENCOUNTER — Ambulatory Visit: Payer: Managed Care, Other (non HMO) | Admitting: Cardiovascular Disease

## 2022-09-02 ENCOUNTER — Telehealth: Payer: Self-pay

## 2022-09-02 NOTE — Telephone Encounter (Signed)
Patient aware of sleep results. Agreed to start use of CPAP. Orders faxed to [facility].

## 2022-09-06 ENCOUNTER — Other Ambulatory Visit: Payer: Self-pay

## 2022-09-06 ENCOUNTER — Encounter: Payer: Self-pay | Admitting: Cardiovascular Disease

## 2022-09-06 DIAGNOSIS — D582 Other hemoglobinopathies: Secondary | ICD-10-CM

## 2022-09-06 DIAGNOSIS — R0683 Snoring: Secondary | ICD-10-CM

## 2022-09-06 DIAGNOSIS — R400 Somnolence: Secondary | ICD-10-CM

## 2022-09-12 ENCOUNTER — Ambulatory Visit: Payer: Managed Care, Other (non HMO) | Admitting: Family Medicine

## 2022-09-14 ENCOUNTER — Telehealth: Payer: Self-pay | Admitting: Hematology and Oncology

## 2022-09-19 ENCOUNTER — Ambulatory Visit (INDEPENDENT_AMBULATORY_CARE_PROVIDER_SITE_OTHER): Payer: Managed Care, Other (non HMO) | Admitting: Family Medicine

## 2022-09-19 ENCOUNTER — Other Ambulatory Visit (HOSPITAL_COMMUNITY): Payer: Self-pay

## 2022-09-19 VITALS — BP 150/78 | HR 62 | Temp 97.9°F | Ht 71.0 in | Wt 225.0 lb

## 2022-09-19 DIAGNOSIS — Z9049 Acquired absence of other specified parts of digestive tract: Secondary | ICD-10-CM | POA: Diagnosis not present

## 2022-09-19 DIAGNOSIS — R1012 Left upper quadrant pain: Secondary | ICD-10-CM | POA: Diagnosis not present

## 2022-09-19 NOTE — Progress Notes (Unsigned)
Acute Office Visit  Subjective:    Patient ID: Jacob Collier, male    DOB: 1955-08-05, 67 y.o.   MRN: [id number]  Chief Complaint  Patient presents with   Abdominal Pain   HPI: Patient is in today for Left upper quadrant abdominal pain. Patient states it has been going on for several months but has intensified over the past few weeks. Patient states its a constant dull ache. He states that he is a little more constipated than normal. He denies diarrhea/vomiting/nausea/indigestion.  Patient went to the emergency department 1 week ago at [facility].  Blood work was all normal.  Patient became frustrated with the wait after for after hours and on opted to leave AGAINST MEDICAL ADVICE.  Patient has a history of partial colectomy secondary to his colon adherent to his bladder.  This was in 2018.  Certain movements seem to worsen the pain.  Past Medical History:  Diagnosis Date   Anxiety    Arthritis    Coronary artery disease    a. S/p DES to RCA in 01/2019 b. s/p CABG x4 (LIMA-LAD, reverse SVG-PDA, reverse SVG-OM1, reverse SVG-1st Diag) in 01/2021   Diverticulosis    Dyspnea    Hard of hearing    Hyperlipidemia    Hypertension    hx of elevation on lyrica, off med and now normal    Paroxysmal SVT (supraventricular tachycardia)    Post-op atrial fibrillation    Prostate cancer (HCC)    Wears glasses     Past Surgical History:  Procedure Laterality Date   ANTERIOR CERVICAL DECOMP/DISCECTOMY FUSION N/A 09/03/2021   Procedure: CERVICAL FIVE-SIX, CERVICAL SIX-SEVEN ANTERIOR CERVICAL DECOMPRESSION/DISCECTOMY FUSION;  Surgeon: Julio Sicks, MD;  Location: [facility] OR;  Service: Neurosurgery;  Laterality: N/A;   CARDIAC CATHETERIZATION     with 2 stents    COLECTOMY  05/2018   COLONOSCOPY     CORONARY ARTERY BYPASS GRAFT N/A 01/19/2021   Procedure: CORONARY ARTERY BYPASS GRAFTING (CABG), ON PUMP TIMES FOUR, USING LEFT INTERNAL MAMMARY ARTERY AND ENDOSOCPICALLY HARVESTED RIGHT GREATER  SAPHENOUS VEIN;  Surgeon: Corliss Skains, MD;  Location: [facility] OR;  Service: Open Heart Surgery;  Laterality: N/A;   CYSTOSCOPY WITH URETHRAL DILATATION N/A 01/14/2021   Procedure: CYSTOSCOPY WITH BALLOON  DILATATION;  Surgeon: Alfredo Martinez, MD;  Location: [facility] ORS;  Service: Urology;  Laterality: N/A;   EYE SURGERY  02/2020   bilaterl cataracts   IR THORACENTESIS ASP PLEURAL SPACE W/IMG GUIDE  02/02/2021   LEFT HEART CATH AND CORONARY ANGIOGRAPHY N/A 12/24/2020   Procedure: LEFT HEART CATH AND CORONARY ANGIOGRAPHY;  Surgeon: Swaziland, Peter M, MD;  Location: [facility] INVASIVE CV LAB;  Service: Cardiovascular;  Laterality: N/A;   PROSTATECTOMY     TEE WITHOUT CARDIOVERSION N/A 01/19/2021   Procedure: TRANSESOPHAGEAL ECHOCARDIOGRAM (TEE);  Surgeon: Corliss Skains, MD;  Location: [facility] OR;  Service: Open Heart Surgery;  Laterality: N/A;   TOTAL HIP ARTHROPLASTY Left 11/10/2015   Procedure: LEFT TOTAL HIP ARTHROPLASTY ANTERIOR APPROACH;  Surgeon: Kathryne Hitch, MD;  Location: [facility] OR;  Service: Orthopedics;  Laterality: Left;    Family History  Problem Relation Age of Onset   Alzheimer's disease Mother    Stroke Father    Diabetes Sister    Heart attack Maternal Grandmother    Cancer Maternal Uncle        prostate   Prostate cancer Maternal Uncle    Cancer Maternal Uncle        prostate  Prostate cancer Maternal Uncle    Colon polyps Brother    Colon cancer Neg Hx    Esophageal cancer Neg Hx    Rectal cancer Neg Hx    Stomach cancer Neg Hx     Social History   Socioeconomic History   Marital status: Married    Spouse name: Not on file   Number of children: 0   Years of education: Not on file   Highest education level: Not on file  Occupational History   Occupation: Employed at [employer]  Tobacco Use   Smoking status: Never   Smokeless tobacco: Never  Vaping Use   Vaping status: Never Used  Substance and Sexual Activity   Alcohol use: Not Currently     Alcohol/week: 0.0 standard drinks of alcohol    Comment: rarely   Drug use: No   Sexual activity: Yes    Partners: Female    Birth control/protection: None  Other Topics Concern   Not on file  Social History Narrative   Not on file   Social Determinants of Health   Financial Resource Strain: Low Risk  (08/04/2022)   Overall Financial Resource Strain (CARDIA)    Difficulty of Paying Living Expenses: Not hard at all  Food Insecurity: No Food Insecurity (08/04/2022)   Hunger Vital Sign    Worried About Running Out of Food in the Last Year: Never true    Ran Out of Food in the Last Year: Never true  Transportation Needs: No Transportation Needs (08/04/2022)   PRAPARE - Administrator, Civil Service (Medical): No    Lack of Transportation (Non-Medical): No  Physical Activity: Sufficiently Active (08/04/2022)   Exercise Vital Sign    Days of Exercise per Week: 5 days    Minutes of Exercise per Session: 60 min  Stress: No Stress Concern Present (08/04/2022)   [employer] of Occupational Health - Occupational Stress Questionnaire    Feeling of Stress : Not at all  Social Connections: Moderately Isolated (08/04/2022)   Social Connection and Isolation Panel [NHANES]    Frequency of Communication with Friends and Family: More than three times a week    Frequency of Social Gatherings with Friends and Family: More than three times a week    Attends Religious Services: Never    Database administrator or Organizations: No    Attends Banker Meetings: Never    Marital Status: Married  Catering manager Violence: Not At Risk (08/04/2022)   Humiliation, Afraid, Rape, and Kick questionnaire    Fear of Current or Ex-Partner: No    Emotionally Abused: No    Physically Abused: No    Sexually Abused: No    Outpatient Medications Prior to Visit  Medication Sig Dispense Refill   acetaminophen (TYLENOL) 500 MG tablet Take 1,000 mg by mouth every 8 (eight) hours as needed  for mild pain.     amLODipine (NORVASC) 10 MG tablet Take 7.5 mg by mouth daily.     aspirin EC 81 MG tablet Take 81 mg by mouth daily. Swallow whole.     clonazePAM (KLONOPIN) 1 MG tablet TAKE 1 TABLET BY MOUTH TWICE A DAY AS NEEDED FOR ANXIETY 60 tablet 3   clopidogrel (PLAVIX) 75 MG tablet TAKE 1 TABLET (75 MG TOTAL) BY MOUTH DAILY. STOP TAKING PLAVIX ON SATURDAY NOV. 5, 2022 90 tablet 3   cyanocobalamin (VITAMIN B12) 1000 MCG tablet Take 1 tablet (1,000 mcg total) by mouth daily. 90 tablet  1   Eszopiclone 3 MG TABS TAKE 1 TABLET BY MOUTH EVERYDAY AT BEDTIME 30 tablet 2   Evolocumab (REPATHA SURECLICK) 140 MG/ML SOAJ INJECT 140 MG INTO THE SKIN EVERY 14 (FOURTEEN) DAYS. 6 mL 3   ezetimibe (ZETIA) 10 MG tablet TAKE 1 TABLET BY MOUTH EVERY DAY 90 tablet 3   metoprolol succinate (TOPROL-XL) 25 MG 24 hr tablet TAKE 0.5 TABLET BY MOUTH DAILY. TAKE WITH OR IMMEDIATELY FOLLOWING A MEAL. 45 tablet 2   Omega-3 Fatty Acids (OMEGA 3 PO) Take 1 capsule by mouth 2 (two) times daily.     Vitamin D, Ergocalciferol, (DRISDOL) 1.25 MG (50000 UNIT) CAPS capsule Take 1 capsule (50,000 Units total) by mouth every 7 (seven) days. 5 capsule 3   amLODipine (NORVASC) 5 MG tablet Take 1 tablet (5 mg total) by mouth at bedtime. 90 tablet 3   No facility-administered medications prior to visit.    Allergies  Allergen Reactions   Ranolazine Other (See Comments)    Chest discomfort/Reflux   Atorvastatin Other (See Comments)    myalgias  Other reaction(s): Myalgias (intolerance)   Pravastatin Other (See Comments)    myalgias  Other reaction(s): Myalgias (intolerance)   Rosuvastatin Other (See Comments)    myalgias  Other reaction(s): Myalgias (intolerance)   Statins Other (See Comments)    myalgias    Review of Systems  Constitutional:  Negative for appetite change, fatigue and fever.  HENT:  Negative for congestion, ear pain, sinus pressure and sore throat.   Respiratory:  Negative for cough, chest  tightness, shortness of breath and wheezing.   Cardiovascular:  Negative for chest pain and palpitations.  Gastrointestinal:  Positive for abdominal pain (Left upper quadrant abdominal pain) and constipation (Constipation worse than normal.). Negative for diarrhea, nausea and vomiting.  Genitourinary:  Negative for dysuria and hematuria.  Musculoskeletal:  Negative for arthralgias, back pain, joint swelling and myalgias.  Skin:  Negative for rash.  Neurological:  Negative for dizziness, weakness and headaches.  Psychiatric/Behavioral:  Negative for dysphoric mood. The patient is not nervous/anxious.        Objective:    Physical Exam Vitals reviewed.  Constitutional:      Appearance: Normal appearance. He is well-developed and normal weight.  Cardiovascular:     Rate and Rhythm: Normal rate and regular rhythm.     Pulses: Normal pulses.     Heart sounds: Normal heart sounds.  Pulmonary:     Effort: Pulmonary effort is normal.     Breath sounds: Normal breath sounds.  Abdominal:     General: Abdomen is flat. Bowel sounds are normal.     Palpations: Abdomen is soft.     Tenderness: There is abdominal tenderness in the left upper quadrant. There is no right CVA tenderness, left CVA tenderness, guarding or rebound.  Neurological:     Mental Status: He is alert and oriented to person, place, and time.  Psychiatric:        Mood and Affect: Mood normal.        Behavior: Behavior normal.     BP (!) 150/78 (BP Location: Left Arm, Patient Position: Sitting)   Pulse 62   Temp 97.9 F (36.6 C) (Temporal)   Ht 5\' 11"  (1.803 m)   Wt 225 lb (102.1 kg)   SpO2 96%   BMI 31.38 kg/m  Wt Readings from Last 3 Encounters:  09/19/22 225 lb (102.1 kg)  08/04/22 229 lb (103.9 kg)  07/27/22 227 lb (103 kg)  Health Maintenance Due  Topic Date Due   Medicare Annual Wellness (AWV)  Never done   DTaP/Tdap/Td (1 - Tdap) Never done   Zoster Vaccines- Shingrix (1 of 2) Never done   Pneumonia  Vaccine 30+ Years old (1 of 1 - PCV) Never done    There are no preventive care reminders to display for this patient.   Lab Results  Component Value Date   TSH 1.570 04/04/2022   Lab Results  Component Value Date   WBC 7.4 08/04/2022   HGB 19.4 (H) 08/04/2022   HCT 59.0 (H) 08/04/2022   MCV 90 08/04/2022   PLT 225 08/04/2022   Lab Results  Component Value Date   NA 143 08/04/2022   K 4.6 08/04/2022   CO2 22 08/04/2022   GLUCOSE 101 (H) 08/04/2022   BUN 13 08/04/2022   CREATININE 1.16 08/04/2022   BILITOT 0.3 08/04/2022   ALKPHOS 82 08/04/2022   AST 26 08/04/2022   ALT 36 08/04/2022   PROT 6.8 08/04/2022   ALBUMIN 4.2 08/04/2022   CALCIUM 9.2 08/04/2022   ANIONGAP 9 05/14/2022   EGFR 69 08/04/2022   Lab Results  Component Value Date   CHOL 84 (L) 07/27/2022   Lab Results  Component Value Date   HDL 39 (L) 07/27/2022   Lab Results  Component Value Date   LDLCALC 22 07/27/2022   Lab Results  Component Value Date   TRIG 132 07/27/2022   Lab Results  Component Value Date   CHOLHDL 2.2 07/27/2022   Lab Results  Component Value Date   HGBA1C 5.8 (H) 03/04/2021         Assessment & Plan:  Acute abdominal pain in left upper quadrant Assessment & Plan: Ordering CT scan of abdomen with contrast.  Due to pain going on for several months and intermittently worsening, I will not get stat.  Patient agrees with this.  He also agrees to go to the hospital if his pain becomes severe again.  Differential diagnosis: Highest on my list is adhesions but this may be a low-level diverticulitis.  Orders: -     CT ABDOMEN PELVIS W CONTRAST; Future  History of partial colectomy Assessment & Plan: May have caused scar tissue.  Patient may be having intermittent bowel obstructions     No orders of the defined types were placed in this encounter. Total time spent on today's visit was greater than 30 minutes, including both face-to-face time and nonface-to-face time  personally spent on review of chart (labs and imaging), discussing labs and goals, discussing further work-up, treatment options, referrals to specialist if needed, reviewing outside records of pertinent, answering patient's questions, and coordinating care.   I,Lauren M Auman,acting as a scribe for Blane Ohara, MD.,have documented all relevant documentation on the behalf of Blane Ohara, MD,as directed by  Blane Ohara, MD while in the presence of Blane Ohara, MD.   Blane Ohara, MD

## 2022-09-19 NOTE — Assessment & Plan Note (Signed)
May have caused scar tissue.  Patient may be having intermittent bowel obstructions

## 2022-09-19 NOTE — Assessment & Plan Note (Addendum)
Ordering CT scan of abdomen with contrast.  Due to pain going on for several months and intermittently worsening, I will not get stat.  Patient agrees with this.  He also agrees to go to the hospital if his pain becomes severe again.  Differential diagnosis: Highest on my list is adhesions but this may be a low-level diverticulitis.

## 2022-09-20 ENCOUNTER — Encounter: Payer: Self-pay | Admitting: Family Medicine

## 2022-09-22 ENCOUNTER — Telehealth: Payer: Self-pay

## 2022-09-22 NOTE — Telephone Encounter (Signed)
Patient's wife called inquiring for patient about his CT scan results. Patient had the CT scan done yesterday and we do not have the report back. Informed patient's wife that it can take up to 24 to 48 hours for [place] to get report back. Told her as soon as we get the report we will call them to give them results. Patient's wife understood verbally.

## 2022-09-23 ENCOUNTER — Telehealth: Payer: Self-pay

## 2022-09-23 NOTE — Telephone Encounter (Signed)
The pts wife called this morning to follow-up on the CT scan that Her husband had done a few days ago. She was notified that we do not have the results as of yet but will let a nurse know that has called to follow-up on it.   Can someone please call the pt or his wife back with the results or more information at [phone].

## 2022-09-24 ENCOUNTER — Other Ambulatory Visit: Payer: Self-pay | Admitting: Family Medicine

## 2022-09-24 ENCOUNTER — Telehealth: Payer: Self-pay | Admitting: Family Medicine

## 2022-09-24 NOTE — Telephone Encounter (Signed)
Patient's wife called about his CT scan of his abdomen and pelvis which was done midweek.  I had not received the results yet.  The patient's had continued left upper quadrant abdominal pain.  It has not changed per se other than time more frequently.  He has a poor appetite.  Patient has a history of a peptic ulcer at age 67.  I called [facility] radiology and had them read his CT scan.  Per verbal report on the phone patient had aortic atherosclerosis and diverticular disease without diverticulitis.  No bowel obstruction or partial obstruction.  Otherwise entirely normal.  Recommended patient get some Nexium OTC take once a day to see if this helps.  Patient may also get Mylanta.  Patient is also concerned about his left rib pain.  Recommended patient call midweek if his symptoms have not significantly improved.  At that time I would refer to GI.  Also for his rib pain if he would like to get x-rays of his ribs he just needs to give [place] a call and we can give him an order.

## 2022-09-26 ENCOUNTER — Other Ambulatory Visit: Payer: Self-pay

## 2022-09-26 DIAGNOSIS — R1012 Left upper quadrant pain: Secondary | ICD-10-CM

## 2022-09-26 NOTE — Telephone Encounter (Signed)
I spoke with patient over the weekend. See telephone note. Dr. Sedalia Muta

## 2022-09-27 ENCOUNTER — Encounter: Payer: Self-pay | Admitting: Family Medicine

## 2022-09-28 ENCOUNTER — Telehealth: Payer: Self-pay | Admitting: Family Medicine

## 2022-09-28 NOTE — Telephone Encounter (Signed)
Pt called today to request a same day appointment for the following symptoms: wife called wanting him to be covid tested due to having all the covid like sxs. Unfortunately, our schedule is full and we have no openings between today or tomorrow. Pt was notified that they can wait on a call from our triage or they can do an e-visit through MyChart with a [facility] Provider from home if they have the ability .

## 2022-09-29 ENCOUNTER — Ambulatory Visit: Payer: Managed Care, Other (non HMO)

## 2022-10-04 ENCOUNTER — Ambulatory Visit: Payer: Managed Care, Other (non HMO) | Attending: Cardiovascular Disease

## 2022-10-04 DIAGNOSIS — I471 Supraventricular tachycardia, unspecified: Secondary | ICD-10-CM

## 2022-10-04 DIAGNOSIS — Z01818 Encounter for other preprocedural examination: Secondary | ICD-10-CM

## 2022-10-05 ENCOUNTER — Telehealth: Payer: Self-pay | Admitting: Hematology and Oncology

## 2022-10-05 LAB — CBC
Hematocrit: 50.1 % (ref 37.5–51.0)
Hemoglobin: 16.5 g/dL (ref 13.0–17.7)
MCH: 29.7 pg (ref 26.6–33.0)
MCHC: 32.9 g/dL (ref 31.5–35.7)
MCV: 90 fL (ref 79–97)
Platelets: 218 10*3/uL (ref 150–450)
RBC: 5.55 x10E6/uL (ref 4.14–5.80)
RDW: 13.5 % (ref 11.6–15.4)
WBC: 10.2 10*3/uL (ref 3.4–10.8)

## 2022-10-05 LAB — BASIC METABOLIC PANEL WITH GFR
BUN/Creatinine Ratio: 17 (ref 10–24)
BUN: 19 mg/dL (ref 8–27)
CO2: 22 mmol/L (ref 20–29)
Calcium: 8.8 mg/dL (ref 8.6–10.2)
Chloride: 106 mmol/L (ref 96–106)
Creatinine, Ser: 1.14 mg/dL (ref 0.76–1.27)
Glucose: 80 mg/dL (ref 70–99)
Potassium: 4.5 mmol/L (ref 3.5–5.2)
Sodium: 142 mmol/L (ref 134–144)
eGFR: 70 mL/min/{1.73_m2} (ref >59)

## 2022-10-10 ENCOUNTER — Ambulatory Visit: Payer: Managed Care, Other (non HMO) | Admitting: Hematology and Oncology

## 2022-10-10 ENCOUNTER — Other Ambulatory Visit: Payer: Managed Care, Other (non HMO)

## 2022-10-20 ENCOUNTER — Ambulatory Visit (HOSPITAL_COMMUNITY): Admit: 2022-10-20 | Payer: Managed Care, Other (non HMO) | Admitting: Cardiovascular Disease

## 2022-10-20 ENCOUNTER — Encounter (HOSPITAL_COMMUNITY): Payer: Self-pay | Admitting: Certified Registered Nurse Anesthetist

## 2022-10-20 ENCOUNTER — Encounter (HOSPITAL_COMMUNITY)
Admission: RE | Disposition: A | Discharge status: Home / Self Care | Payer: Self-pay | Source: Home / Self Care | Attending: Cardiovascular Disease

## 2022-10-20 ENCOUNTER — Ambulatory Visit (HOSPITAL_COMMUNITY)
Admission: RE | Admit: 2022-10-20 | Discharge: 2022-10-20 | Disposition: A | Discharge status: Home / Self Care | Payer: Managed Care, Other (non HMO) | Attending: Cardiovascular Disease | Admitting: Cardiovascular Disease

## 2022-10-20 ENCOUNTER — Other Ambulatory Visit: Payer: Self-pay

## 2022-10-20 DIAGNOSIS — I4719 Other supraventricular tachycardia: Secondary | ICD-10-CM | POA: Insufficient documentation

## 2022-10-20 DIAGNOSIS — I471 Supraventricular tachycardia, unspecified: Secondary | ICD-10-CM

## 2022-10-20 HISTORY — PX: SVT ABLATION: EP1225

## 2022-10-20 SURGERY — SVT ABLATION

## 2022-10-20 SURGERY — SVT ABLATION
Anesthesia: General

## 2022-10-20 MED ORDER — ONDANSETRON HCL 4 MG/2ML IJ SOLN: 4.0000 mg | Freq: Four times a day (QID) | INTRAMUSCULAR | Status: DC | PRN

## 2022-10-20 MED ORDER — ACETAMINOPHEN 325 MG PO TABS: 650.0000 mg | ORAL_TABLET | ORAL | Status: DC | PRN

## 2022-10-20 MED ORDER — BUPIVACAINE HCL (PF) 0.25 % IJ SOLN
INTRAMUSCULAR | Status: DC | PRN
  Administered 2022-10-20: 30 mL

## 2022-10-20 MED ORDER — ISOPROTERENOL HCL 0.2 MG/ML IJ SOLN
INTRAMUSCULAR | Status: AC
  Filled 2022-10-20: qty 5

## 2022-10-20 MED ORDER — MIDAZOLAM HCL 5 MG/5ML IJ SOLN
INTRAMUSCULAR | Status: AC
  Filled 2022-10-20: qty 5

## 2022-10-20 MED ORDER — FENTANYL CITRATE (PF) 100 MCG/2ML IJ SOLN
INTRAMUSCULAR | Status: DC | PRN
  Administered 2022-10-20: 25 ug via INTRAVENOUS
  Administered 2022-10-20: 50 ug via INTRAVENOUS

## 2022-10-20 MED ORDER — MIDAZOLAM HCL 5 MG/5ML IJ SOLN
INTRAMUSCULAR | Status: DC | PRN
  Administered 2022-10-20: 2 mg via INTRAVENOUS
  Administered 2022-10-20: 1 mg via INTRAVENOUS

## 2022-10-20 MED ORDER — ACETAMINOPHEN 10 MG/ML IV SOLN: 1000.0000 mg | Freq: Once | INTRAVENOUS | Status: DC | PRN

## 2022-10-20 MED ORDER — FENTANYL CITRATE (PF) 100 MCG/2ML IJ SOLN
INTRAMUSCULAR | Status: AC
  Filled 2022-10-20: qty 2

## 2022-10-20 MED ORDER — OXYCODONE HCL 5 MG PO TABS: 5.0000 mg | ORAL_TABLET | Freq: Once | ORAL | Status: DC | PRN

## 2022-10-20 MED ORDER — HEPARIN (PORCINE) IN NACL 1000-0.9 UT/500ML-% IV SOLN
INTRAVENOUS | Status: DC | PRN
  Administered 2022-10-20: 500 mL

## 2022-10-20 MED ORDER — DROPERIDOL 2.5 MG/ML IJ SOLN: 0.6250 mg | Freq: Once | INTRAMUSCULAR | Status: DC | PRN

## 2022-10-20 MED ORDER — ISOPROTERENOL HCL 0.2 MG/ML IJ SOLN
INTRAVENOUS | Status: DC | PRN
  Administered 2022-10-20: 2 ug/min via INTRAVENOUS

## 2022-10-20 MED ORDER — SODIUM CHLORIDE 0.9 % IV SOLN: 250.0000 mL | INTRAVENOUS | Status: DC | PRN

## 2022-10-20 MED ORDER — SODIUM CHLORIDE 0.9 % IV SOLN: INTRAVENOUS | Status: DC

## 2022-10-20 MED ORDER — SODIUM CHLORIDE 0.9% FLUSH: 3.0000 mL | INTRAVENOUS | Status: DC | PRN

## 2022-10-20 MED ORDER — OXYCODONE HCL 5 MG/5ML PO SOLN: 5.0000 mg | Freq: Once | ORAL | Status: DC | PRN

## 2022-10-20 MED ORDER — FENTANYL CITRATE (PF) 100 MCG/2ML IJ SOLN: 25.0000 ug | INTRAMUSCULAR | Status: DC | PRN

## 2022-10-20 MED ORDER — BUPIVACAINE HCL (PF) 0.25 % IJ SOLN
INTRAMUSCULAR | Status: AC
  Filled 2022-10-20: qty 30

## 2022-10-20 SURGICAL SUPPLY — 10 items
CATH DECANAV F CURVE (CATHETERS) IMPLANT
CATH EZ STEER NAV 4MM F-J CUR (ABLATOR) IMPLANT
CATH JOSEPH QUAD ALLRED 6F REP (CATHETERS) IMPLANT
DEVICE CLOSURE MYNXGRIP 6/7F (Vascular Products) IMPLANT
PACK EP LATEX FREE (CUSTOM PROCEDURE TRAY) ×1
PACK EP LF (CUSTOM PROCEDURE TRAY) ×1 IMPLANT
PAD DEFIB RADIO PHYSIO CONN (PAD) ×1 IMPLANT
PATCH CARTO3 (PAD) IMPLANT
SHEATH PINNACLE 6F 10CM (SHEATH) IMPLANT
SHEATH PINNACLE 8F 10CM (SHEATH) IMPLANT

## 2022-10-20 NOTE — H&P (Signed)
Electrophysiology Office Note:    Date:  10/20/2022   ID:  Jacob Collier, DOB [birth year], MRN [id number]  PCP:  Blane Ohara, MD   [facility] Providers Cardiologist:  Nicki Guadalajara, MD Cardiology APP:  Corrin Parker, PA-C  Electrophysiologist:  Maurice Small, MD     Referring MD: Nelly Laurence Roberts Gaudy, MD   History of Present Illness:    Jacob Collier is a 67 y.o. male with a hx listed below, significant for paroxysmal SVT, referred for arrhythmia management.  He recently wore a monitor that detected occasional episodes of tachycardia. The longest lasted almost 15 minutes.  Atrial activity is difficult to discern due to artifact.  He has had rapid and irregular rhythms for years, predating his CABG.  Seems like they have gotten worse recently.  He is particular bothered by rapid rates which last for a few minutes.  He uses a pulse ox and it frequently shows rates of about 145 bpm.  These occur with rapid onset but oftentimes slow down gradually.  He also notices occasional irregular rhythms.  He has not had syncope.  He has had edema with amlodipine.  He does not tolerate higher doses of beta-blocker very well.  He presents for EP study and possible ablation of SVT.  Past Medical History:  Diagnosis Date   Anxiety    Arthritis    Coronary artery disease    a. S/p DES to RCA in 01/2019 b. s/p CABG x4 (LIMA-LAD, reverse SVG-PDA, reverse SVG-OM1, reverse SVG-1st Diag) in 01/2021   Diverticulosis    Dyspnea    Hard of hearing    Hyperlipidemia    Hypertension    hx of elevation on lyrica, off med and now normal    Paroxysmal SVT (supraventricular tachycardia)    Post-op atrial fibrillation    Prostate cancer (HCC)    Wears glasses     Past Surgical History:  Procedure Laterality Date   ANTERIOR CERVICAL DECOMP/DISCECTOMY FUSION N/A 09/03/2021   Procedure: CERVICAL FIVE-SIX, CERVICAL SIX-SEVEN ANTERIOR CERVICAL DECOMPRESSION/DISCECTOMY FUSION;  Surgeon:  Julio Sicks, MD;  Location: [facility] OR;  Service: Neurosurgery;  Laterality: N/A;   CARDIAC CATHETERIZATION     with 2 stents    COLECTOMY  05/2018   COLONOSCOPY     CORONARY ARTERY BYPASS GRAFT N/A 01/19/2021   Procedure: CORONARY ARTERY BYPASS GRAFTING (CABG), ON PUMP TIMES FOUR, USING LEFT INTERNAL MAMMARY ARTERY AND ENDOSOCPICALLY HARVESTED RIGHT GREATER SAPHENOUS VEIN;  Surgeon: Corliss Skains, MD;  Location: [facility] OR;  Service: Open Heart Surgery;  Laterality: N/A;   CYSTOSCOPY WITH URETHRAL DILATATION N/A 01/14/2021   Procedure: CYSTOSCOPY WITH BALLOON  DILATATION;  Surgeon: Alfredo Martinez, MD;  Location: [facility] ORS;  Service: Urology;  Laterality: N/A;   EYE SURGERY  02/2020   bilaterl cataracts   IR THORACENTESIS ASP PLEURAL SPACE W/IMG GUIDE  02/02/2021   LEFT HEART CATH AND CORONARY ANGIOGRAPHY N/A 12/24/2020   Procedure: LEFT HEART CATH AND CORONARY ANGIOGRAPHY;  Surgeon: Swaziland, Peter M, MD;  Location: [facility] INVASIVE CV LAB;  Service: Cardiovascular;  Laterality: N/A;   PROSTATECTOMY     TEE WITHOUT CARDIOVERSION N/A 01/19/2021   Procedure: TRANSESOPHAGEAL ECHOCARDIOGRAM (TEE);  Surgeon: Corliss Skains, MD;  Location: [facility] OR;  Service: Open Heart Surgery;  Laterality: N/A;   TOTAL HIP ARTHROPLASTY Left 11/10/2015   Procedure: LEFT TOTAL HIP ARTHROPLASTY ANTERIOR APPROACH;  Surgeon: Kathryne Hitch, MD;  Location: [facility] OR;  Service: Orthopedics;  Laterality: Left;  Current Medications: Current Meds  Medication Sig   acetaminophen (TYLENOL) 500 MG tablet Take 1,000 mg by mouth every 8 (eight) hours as needed for mild pain.   amLODipine (NORVASC) 5 MG tablet Take 2.5-5 mg by mouth See admin instructions. Take 1 tablet (5 mg) by mouth every morning & take 0.5 tablet (2.5 mg) by mouth at night.   aspirin EC 81 MG tablet Take 81 mg by mouth daily. Swallow whole.   clonazePAM (KLONOPIN) 1 MG tablet TAKE 1 TABLET BY MOUTH TWICE A DAY AS NEEDED FOR ANXIETY   clopidogrel (PLAVIX)  75 MG tablet TAKE 1 TABLET (75 MG TOTAL) BY MOUTH DAILY. STOP TAKING PLAVIX ON SATURDAY NOV. 5, 2022   cyclobenzaprine (FLEXERIL) 10 MG tablet Take 10 mg by mouth 3 (three) times daily as needed for muscle spasms.   Eszopiclone 3 MG TABS TAKE 1 TABLET BY MOUTH EVERYDAY AT BEDTIME (Patient taking differently: Take 3 mg by mouth daily as needed (sleep).)   Evolocumab (REPATHA SURECLICK) 140 MG/ML SOAJ INJECT 140 MG INTO THE SKIN EVERY 14 (FOURTEEN) DAYS.   ezetimibe (ZETIA) 10 MG tablet TAKE 1 TABLET BY MOUTH EVERY DAY   metoprolol succinate (TOPROL-XL) 25 MG 24 hr tablet TAKE 0.5 TABLET BY MOUTH DAILY. TAKE WITH OR IMMEDIATELY FOLLOWING A MEAL.   Omega-3 Fatty Acids (OMEGA 3 PO) Take 500 mg by mouth every morning.     Allergies:   Ranolazine, Amoxicillin, Atorvastatin, Pravastatin, Rosuvastatin, and Statins   Social and Family History: Reviewed in Epic  ROS:   Please see the history of present illness.    All other systems reviewed and are negative.  EKGs/Labs/Other Studies Reviewed Today:    Echocardiogram:  TEE 02/01/21 EF 50% with mildly hypokinetic inferior wall    Monitors:  ZioXT 11 days 04/2022 Sinus rhythm HR 47-99 bpm, avg 69. 2% PACs  Multiple episodes of narrow complex tachycardia One strip (2/10 10:11:03) appears to show a short R-P  EKG:  Last EKG results: sinus rhythm with PACs   Recent Labs: 04/04/2022: TSH 1.570 04/25/2022: Magnesium 2.1 05/14/2022: B Natriuretic Peptide 204.5 08/04/2022: ALT 36 10/04/2022: BUN 19; Creatinine, Ser 1.14; Hemoglobin 16.5; Platelets 218; Potassium 4.5; Sodium 142     Physical Exam:    VS:  BP (!) 149/97   Pulse 75   Temp 98.8 F (37.1 C) (Oral)   Resp 18   Ht 5\' 11"  (1.803 m)   Wt 102.1 kg   SpO2 97%   BMI 31.38 kg/m     Wt Readings from Last 3 Encounters:  10/20/22 102.1 kg  09/19/22 102.1 kg  08/04/22 103.9 kg     GEN: Well nourished, well developed in no acute distress CARDIAC: RRR, no murmurs, rubs,  gallops RESPIRATORY:  Normal work of breathing MUSCULOSKELETAL: no edema    ASSESSMENT & PLAN:    SVT Appears to be short R-P on monitor He has had frequent PACs in the past I recommended EP study.  If we are able to identify a pathway or induce sustained atrial tachycardia, we will proceed with ablation  We discussed the indication, rationale, logistics, anticipated benefits, and potential risks of the ablation procedure including but not limited to -- bleed at the groin access site, chest pain, damage to nearby organs such as the diaphragm, lungs, or esophagus, need for a drainage tube, or prolonged hospitalization. I explained that the risk for stroke, heart attack, need for open chest surgery, or even death is very low but not zero.  he  expressed understanding and wishes to proceed.   CAD s/p DES and CABG  On ASA and plavix. Will need to hold plavix the day prior to the procedure Continue metoprolol            Medication Adjustments/Labs and Tests Ordered: Current medicines are reviewed at length with the patient today.  Concerns regarding medicines are outlined above.  Orders Placed This Encounter  Procedures   Informed Consent Details: Physician/Practitioner Attestation; Transcribe to consent form and obtain patient signature   Initiate Pre-op Protocol   Void on call to EP Lab   Confirm CBC and BMP (or CMP) results within 7 days for inpatient and 30 days for outpatient:   Clip right and left femoral area PM before surgery   Clip right internal jugular area PM before surgery   Pre-admission testing diagnosis   EP STUDY   Insert peripheral IV   Meds ordered this encounter  Medications   0.9 %  sodium chloride infusion     Signed, Maurice Small, MD  10/20/2022 4:10 PM     [facility]

## 2022-10-20 NOTE — Anesthesia Preprocedure Evaluation (Deleted)
Anesthesia Evaluation  Patient identified by MRN, date of birth, ID band Patient awake    Reviewed: Allergy & Precautions, NPO status , Patient's Chart, lab work & pertinent test results, reviewed documented beta blocker date and time   Airway Mallampati: II  TM Distance: >3 FB Neck ROM: Full    Dental no notable dental hx.    Pulmonary    Pulmonary exam normal breath sounds clear to auscultation       Cardiovascular hypertension, + CAD and + CABG  Normal cardiovascular exam Rhythm:Regular Rate:Normal  SVT   Neuro/Psych   Anxiety        GI/Hepatic   Endo/Other    Renal/GU    Hx prostate CA    Musculoskeletal  (+) Arthritis ,    Abdominal   Peds  Hematology   Anesthesia Other Findings   Reproductive/Obstetrics                             Anesthesia Physical Anesthesia Plan  ASA: 3  Anesthesia Plan: MAC   Post-op Pain Management:    Induction: Intravenous  PONV Risk Score and Plan: Treatment may vary due to age or medical condition  Airway Management Planned: Natural Airway  Additional Equipment:   Intra-op Plan:   Post-operative Plan:   Informed Consent: I have reviewed the patients History and Physical, chart, labs and discussed the procedure including the risks, benefits and alternatives for the proposed anesthesia with the patient or authorized representative who has indicated his/her understanding and acceptance.     Dental advisory given  Plan Discussed with: CRNA  Anesthesia Plan Comments:         Anesthesia Quick Evaluation

## 2022-10-20 NOTE — Discharge Instructions (Signed)

## 2022-10-20 NOTE — Progress Notes (Signed)
Patient walked to the bathroom without difficulties. Right groin site, level 0, clean, dry and intact

## 2022-10-21 ENCOUNTER — Encounter (HOSPITAL_COMMUNITY): Payer: Self-pay | Admitting: Cardiovascular Disease

## 2022-10-24 ENCOUNTER — Telehealth: Payer: Self-pay | Admitting: Cardiovascular Disease

## 2022-10-24 DIAGNOSIS — Z0279 Encounter for issue of other medical certificate: Secondary | ICD-10-CM

## 2022-10-24 NOTE — Telephone Encounter (Signed)
Pt would like a callback regarding FMLA paperwork. Please advise

## 2022-10-24 NOTE — Telephone Encounter (Signed)
Pt has dropped off fmla pw. Has paid. Will be in provider box by eod.

## 2022-10-25 ENCOUNTER — Other Ambulatory Visit: Payer: Self-pay | Admitting: Family Medicine

## 2022-10-25 ENCOUNTER — Other Ambulatory Visit: Payer: Self-pay | Admitting: Cardiovascular Disease

## 2022-10-25 DIAGNOSIS — I25110 Atherosclerotic heart disease of native coronary artery with unstable angina pectoris: Secondary | ICD-10-CM

## 2022-10-26 MED ORDER — AMLODIPINE BESYLATE 5 MG PO TABS: ORAL_TABLET | ORAL | 1 refills | Status: DC

## 2022-10-27 ENCOUNTER — Telehealth: Payer: Self-pay | Admitting: Cardiovascular Disease

## 2022-10-27 ENCOUNTER — Telehealth: Payer: Self-pay

## 2022-10-27 NOTE — Telephone Encounter (Signed)
The patients wife called this morning stating that Jacob Collier is having a difficulty time breathing. The patient stated that he is having chest pain and coughing. The wife stated that he is achy and feels hot.  Due to the chest pain and difficulty breathing it was recommended for the wife to take him to the emergency department. She stated that she can take him to the UC, however I insisted that she take him to the emergency department due to his chest pain with difficulty breathing. She stated that she would try to talk him into it.

## 2022-10-27 NOTE — Telephone Encounter (Signed)
Pt c/o Shortness Of Breath: STAT if SOB developed within the last 24 hours or pt is noticeably SOB on the phone  1. Are you currently SOB (can you hear that pt is SOB on the phone)?    2. How long have you been experiencing SOB?  Has been going on since 8/15 ablation but became much worse today  3. Are you SOB when sitting or when up moving around?  Both   4. Are you currently experiencing any other symptoms?  Coughing, weakness, loss of appetite, fever

## 2022-10-27 NOTE — Telephone Encounter (Signed)
I spoke w the patient and his wife. He apparently has been sob since before his SVT ablation one week ago but it has gotten worse but "I can handle the shortness of breath".  He also reports that he woke this am and his body ached so bad he could not get out of bed.  Also reporting a fever of 98.4 and cough..  Home Covid test today is negative.  I advised he should go to the ER for evaluation as we cannot eval those symptoms in the office.  He does not want to go to the hospital at 4:30 pm and he would get up and go in the morning, or he will go to UC this evening.

## 2022-10-27 NOTE — Telephone Encounter (Signed)
Agree. Dr. Cox  

## 2022-10-28 ENCOUNTER — Other Ambulatory Visit: Payer: Self-pay

## 2022-10-28 ENCOUNTER — Inpatient Hospital Stay (HOSPITAL_COMMUNITY)
Admission: EM | Admit: 2022-10-28 | Discharge: 2022-11-02 | DRG: 299 | Disposition: A | Discharge status: Home / Self Care | Payer: Managed Care, Other (non HMO) | Attending: Internal Medicine | Admitting: Internal Medicine

## 2022-10-28 ENCOUNTER — Emergency Department (HOSPITAL_COMMUNITY): Payer: Managed Care, Other (non HMO)

## 2022-10-28 DIAGNOSIS — Z83719 Family history of colon polyps, unspecified: Secondary | ICD-10-CM

## 2022-10-28 DIAGNOSIS — Z880 Allergy status to penicillin: Secondary | ICD-10-CM

## 2022-10-28 DIAGNOSIS — E860 Dehydration: Secondary | ICD-10-CM | POA: Diagnosis not present

## 2022-10-28 DIAGNOSIS — T81718A Complication of other artery following a procedure, not elsewhere classified, initial encounter: Principal | ICD-10-CM | POA: Diagnosis present

## 2022-10-28 DIAGNOSIS — E871 Hypo-osmolality and hyponatremia: Secondary | ICD-10-CM | POA: Diagnosis present

## 2022-10-28 DIAGNOSIS — I4891 Unspecified atrial fibrillation: Secondary | ICD-10-CM | POA: Diagnosis present

## 2022-10-28 DIAGNOSIS — Z8249 Family history of ischemic heart disease and other diseases of the circulatory system: Secondary | ICD-10-CM

## 2022-10-28 DIAGNOSIS — E669 Obesity, unspecified: Secondary | ICD-10-CM | POA: Diagnosis present

## 2022-10-28 DIAGNOSIS — I441 Atrioventricular block, second degree: Secondary | ICD-10-CM | POA: Diagnosis present

## 2022-10-28 DIAGNOSIS — I2699 Other pulmonary embolism without acute cor pulmonale: Secondary | ICD-10-CM | POA: Diagnosis present

## 2022-10-28 DIAGNOSIS — N179 Acute kidney failure, unspecified: Secondary | ICD-10-CM | POA: Diagnosis present

## 2022-10-28 DIAGNOSIS — Z955 Presence of coronary angioplasty implant and graft: Secondary | ICD-10-CM

## 2022-10-28 DIAGNOSIS — Z9049 Acquired absence of other specified parts of digestive tract: Secondary | ICD-10-CM

## 2022-10-28 DIAGNOSIS — J9601 Acute respiratory failure with hypoxia: Secondary | ICD-10-CM | POA: Diagnosis present

## 2022-10-28 DIAGNOSIS — Z7902 Long term (current) use of antithrombotics/antiplatelets: Secondary | ICD-10-CM

## 2022-10-28 DIAGNOSIS — I2510 Atherosclerotic heart disease of native coronary artery without angina pectoris: Secondary | ICD-10-CM | POA: Diagnosis present

## 2022-10-28 DIAGNOSIS — Z23 Encounter for immunization: Secondary | ICD-10-CM

## 2022-10-28 DIAGNOSIS — Z951 Presence of aortocoronary bypass graft: Secondary | ICD-10-CM

## 2022-10-28 DIAGNOSIS — Z833 Family history of diabetes mellitus: Secondary | ICD-10-CM

## 2022-10-28 DIAGNOSIS — M1990 Unspecified osteoarthritis, unspecified site: Secondary | ICD-10-CM | POA: Diagnosis present

## 2022-10-28 DIAGNOSIS — E8809 Other disorders of plasma-protein metabolism, not elsewhere classified: Secondary | ICD-10-CM | POA: Diagnosis not present

## 2022-10-28 DIAGNOSIS — Z6832 Body mass index (BMI) 32.0-32.9, adult: Secondary | ICD-10-CM

## 2022-10-28 DIAGNOSIS — I10 Essential (primary) hypertension: Secondary | ICD-10-CM | POA: Diagnosis present

## 2022-10-28 DIAGNOSIS — Z1152 Encounter for screening for COVID-19: Secondary | ICD-10-CM

## 2022-10-28 DIAGNOSIS — Z96642 Presence of left artificial hip joint: Secondary | ICD-10-CM | POA: Diagnosis present

## 2022-10-28 DIAGNOSIS — Z7901 Long term (current) use of anticoagulants: Secondary | ICD-10-CM

## 2022-10-28 DIAGNOSIS — R509 Fever, unspecified: Secondary | ICD-10-CM | POA: Diagnosis not present

## 2022-10-28 DIAGNOSIS — R7989 Other specified abnormal findings of blood chemistry: Secondary | ICD-10-CM | POA: Diagnosis present

## 2022-10-28 DIAGNOSIS — H9190 Unspecified hearing loss, unspecified ear: Secondary | ICD-10-CM | POA: Diagnosis present

## 2022-10-28 DIAGNOSIS — Z823 Family history of stroke: Secondary | ICD-10-CM

## 2022-10-28 DIAGNOSIS — D751 Secondary polycythemia: Secondary | ICD-10-CM | POA: Diagnosis present

## 2022-10-28 DIAGNOSIS — F411 Generalized anxiety disorder: Secondary | ICD-10-CM | POA: Diagnosis present

## 2022-10-28 DIAGNOSIS — E785 Hyperlipidemia, unspecified: Secondary | ICD-10-CM | POA: Diagnosis present

## 2022-10-28 DIAGNOSIS — Z7982 Long term (current) use of aspirin: Secondary | ICD-10-CM

## 2022-10-28 DIAGNOSIS — Z8546 Personal history of malignant neoplasm of prostate: Secondary | ICD-10-CM

## 2022-10-28 DIAGNOSIS — Z79899 Other long term (current) drug therapy: Secondary | ICD-10-CM

## 2022-10-28 DIAGNOSIS — Z888 Allergy status to other drugs, medicaments and biological substances status: Secondary | ICD-10-CM

## 2022-10-28 DIAGNOSIS — I119 Hypertensive heart disease without heart failure: Secondary | ICD-10-CM | POA: Diagnosis present

## 2022-10-28 DIAGNOSIS — Z981 Arthrodesis status: Secondary | ICD-10-CM

## 2022-10-28 DIAGNOSIS — Z820 Family history of epilepsy and other diseases of the nervous system: Secondary | ICD-10-CM

## 2022-10-28 DIAGNOSIS — Z8042 Family history of malignant neoplasm of prostate: Secondary | ICD-10-CM

## 2022-10-28 DIAGNOSIS — E872 Acidosis, unspecified: Secondary | ICD-10-CM | POA: Diagnosis present

## 2022-10-28 DIAGNOSIS — Y838 Other surgical procedures as the cause of abnormal reaction of the patient, or of later complication, without mention of misadventure at the time of the procedure: Secondary | ICD-10-CM | POA: Diagnosis present

## 2022-10-28 LAB — BRAIN NATRIURETIC PEPTIDE: B Natriuretic Peptide: 40.3 pg/mL (ref 0.0–100.0)

## 2022-10-28 LAB — BASIC METABOLIC PANEL
Anion gap: 15 (ref 5–15)
BUN: 11 mg/dL (ref 8–23)
CO2: 18 mmol/L — ABNORMAL LOW (ref 22–32)
Calcium: 8.8 mg/dL — ABNORMAL LOW (ref 8.9–10.3)
Chloride: 101 mmol/L (ref 98–111)
Creatinine, Ser: 1.35 mg/dL — ABNORMAL HIGH (ref 0.61–1.24)
GFR, Estimated: 57 mL/min — ABNORMAL LOW (ref >60)
Glucose, Bld: 102 mg/dL — ABNORMAL HIGH (ref 70–99)
Potassium: 4.3 mmol/L (ref 3.5–5.1)
Sodium: 134 mmol/L — ABNORMAL LOW (ref 135–145)

## 2022-10-28 LAB — CBC
HCT: 56.7 % — ABNORMAL HIGH (ref 39.0–52.0)
Hemoglobin: 18.8 g/dL — ABNORMAL HIGH (ref 13.0–17.0)
MCH: 28.5 pg (ref 26.0–34.0)
MCHC: 33.2 g/dL (ref 30.0–36.0)
MCV: 85.9 fL (ref 80.0–100.0)
Platelets: 228 10*3/uL (ref 150–400)
RBC: 6.6 MIL/uL — ABNORMAL HIGH (ref 4.22–5.81)
RDW: 14.5 % (ref 11.5–15.5)
WBC: 5.3 10*3/uL (ref 4.0–10.5)
nRBC: 0 % (ref 0.0–0.2)

## 2022-10-28 LAB — TROPONIN I (HIGH SENSITIVITY)
Troponin I (High Sensitivity): 35 ng/L — ABNORMAL HIGH (ref <18)
Troponin I (High Sensitivity): 33 ng/L — ABNORMAL HIGH (ref <18)

## 2022-10-28 LAB — PROTIME-INR
INR: 1.1 (ref 0.8–1.2)
Prothrombin Time: 14.4 seconds (ref 11.4–15.2)

## 2022-10-28 MED ORDER — SODIUM CHLORIDE 0.9 % IV BOLUS
500.0000 mL | Freq: Once | INTRAVENOUS | Status: AC
  Administered 2022-10-29: 500 mL via INTRAVENOUS

## 2022-10-28 NOTE — ED Provider Notes (Signed)
 [facility] Emergency Department Provider Note MRN:  [id number]  Arrival date & time: 10/29/22     Chief Complaint   Shortness of Breath   History of Present Illness   Jacob Collier is a 67 y.o. year-old male with a history of hypertension, CAD, prostate cancer presenting to the ED with chief complaint of shortness of breath.  Patient explains that he recently had an ablation.  The next day he felt generally unwell with malaise and fatigue, body aches, persistent cough, shortness of breath.  Subjective fever as well.  Up all night coughing.  Wife noting short rapid breathing.  Says this is the sickest he is ever felt.  Tested negative for COVID yesterday at urgent care.  A bit sleepier than normal per wife.  Review of Systems  A thorough review of systems was obtained and all systems are negative except as noted in the HPI and PMH.   Patient's Health History    Past Medical History:  Diagnosis Date   Anxiety    Arthritis    Coronary artery disease    a. S/p DES to RCA in 01/2019 b. s/p CABG x4 (LIMA-LAD, reverse SVG-PDA, reverse SVG-OM1, reverse SVG-1st Diag) in 01/2021   Diverticulosis    Dyspnea    Hard of hearing    Hyperlipidemia    Hypertension    hx of elevation on lyrica , off med and now normal    Paroxysmal SVT (supraventricular tachycardia)    Post-op atrial fibrillation    Prostate cancer (HCC)    Wears glasses     Past Surgical History:  Procedure Laterality Date   ANTERIOR CERVICAL DECOMP/DISCECTOMY FUSION N/A 09/03/2021   Procedure: CERVICAL FIVE-SIX, CERVICAL SIX-SEVEN ANTERIOR CERVICAL DECOMPRESSION/DISCECTOMY FUSION;  Surgeon: Agustina Aldrich, MD;  Location: [facility];  Service: Neurosurgery;  Laterality: N/A;   CARDIAC CATHETERIZATION     with 2 stents    COLECTOMY  05/2018   COLONOSCOPY     CORONARY ARTERY BYPASS GRAFT N/A 01/19/2021   Procedure: CORONARY ARTERY BYPASS GRAFTING (CABG), ON PUMP TIMES FOUR, USING LEFT INTERNAL MAMMARY  ARTERY AND ENDOSOCPICALLY HARVESTED RIGHT GREATER SAPHENOUS VEIN;  Surgeon: Hilarie Lovely, MD;  Location: [facility] OR;  Service: Open Heart Surgery;  Laterality: N/A;   CYSTOSCOPY WITH URETHRAL DILATATION N/A 01/14/2021   Procedure: CYSTOSCOPY WITH BALLOON  DILATATION;  Surgeon: Erman Hayward, MD;  Location: [facility] ORS;  Service: Urology;  Laterality: N/A;   EYE SURGERY  02/2020   bilaterl cataracts   IR THORACENTESIS ASP PLEURAL SPACE W/IMG GUIDE  02/02/2021   LEFT HEART CATH AND CORONARY ANGIOGRAPHY N/A 12/24/2020   Procedure: LEFT HEART CATH AND CORONARY ANGIOGRAPHY;  Surgeon: Swazil[place], Peter M, MD;  Location: [facility] INVASIVE CV LAB;  Service: Cardiovascular;  Laterality: N/A;   PROSTATECTOMY     SVT ABLATION N/A 10/20/2022   Procedure: SVT ABLATION;  Surgeon: Efraim Grange, MD;  Location: [facility] INVASIVE CV LAB;  Service: Cardiovascular;  Laterality: N/A;   TEE WITHOUT CARDIOVERSION N/A 01/19/2021   Procedure: TRANSESOPHAGEAL ECHOCARDIOGRAM (TEE);  Surgeon: Hilarie Lovely, MD;  Location: [facility] OR;  Service: Open Heart Surgery;  Laterality: N/A;   TOTAL HIP ARTHROPLASTY Left 11/10/2015   Procedure: LEFT TOTAL HIP ARTHROPLASTY ANTERIOR APPROACH;  Surgeon: Arnie Lao, MD;  Location: [facility];  Service: Orthopedics;  Laterality: Left;    Family History  Problem Relation Age of Onset   Alzheimer's disease Mother    Stroke Father    Diabetes Sister  Heart attack Maternal Grandmother    Cancer Maternal Uncle        prostate   Prostate cancer Maternal Uncle    Cancer Maternal Uncle        prostate   Prostate cancer Maternal Uncle    Colon polyps Brother    Colon cancer Neg Hx    Esophageal cancer Neg Hx    Rectal cancer Neg Hx    Stomach cancer Neg Hx     Social History   Socioeconomic History   Marital status: Married    Spouse name: Not on file   Number of children: 0   Years of education: Not on file   Highest education level: Not on file  Occupational History    Occupation: Employed at [employer]  Tobacco Use   Smoking status: Never   Smokeless tobacco: Never  Vaping Use   Vaping status: Never Used  Substance and Sexual Activity   Alcohol use: Not Currently    Alcohol/week: 0.0 standard drinks of alcohol    Comment: rarely   Drug use: No   Sexual activity: Yes    Partners: Female    Birth control/protection: None  Other Topics Concern   Not on file  Social History Narrative   Not on file   Social Determinants of Health   Financial Resource Strain: Low Risk  (08/04/2022)   Overall Financial Resource Strain (CARDIA)    Difficulty of Paying Living Expenses: Not hard at all  Food Insecurity: No Food Insecurity (08/04/2022)   Hunger Vital Sign    Worried About Running Out of Food in the Last Year: Never true    Ran Out of Food in the Last Year: Never true  Transportation Needs: No Transportation Needs (08/04/2022)   PRAPARE - Administrator, Civil Service (Medical): No    Lack of Transportation (Non-Medical): No  Physical Activity: Sufficiently Active (08/04/2022)   Exercise Vital Sign    Days of Exercise per Week: 5 days    Minutes of Exercise per Session: 60 min  Stress: No Stress Concern Present (08/04/2022)   [employer] of Occupational Health - Occupational Stress Questionnaire    Feeling of Stress : Not at all  Social Connections: Moderately Isolated (08/04/2022)   Social Connection and Isolation Panel [NHANES]    Frequency of Communication with Friends and Family: More than three times a week    Frequency of Social Gatherings with Friends and Family: More than three times a week    Attends Religious Services: Never    Database administrator or Organizations: No    Attends Banker Meetings: Never    Marital Status: Married  Catering manager Violence: Not At Risk (08/04/2022)   Humiliation, Afraid, Rape, and Kick questionnaire    Fear of Current or Ex-Partner: No    Emotionally Abused: No     Physically Abused: No    Sexually Abused: No     Physical Exam   Vitals:   10/28/22 2011 10/28/22 2345  BP: 119/75 116/76  Pulse: 80 64  Resp: (!) 22 20  Temp: 99 F (37.2 C) 97.9 F (36.6 C)  SpO2: 95% 95%    CONSTITUTIONAL: Well-appearing, NAD NEURO/PSYCH:  Alert and oriented x 3, no focal deficits EYES:  eyes equal and reactive ENT/NECK:  no LAD, no JVD CARDIO: Regular rate, well-perfused, normal S1 and S2 PULM:  CTAB no wheezing or rhonchi GI/GU:  non-distended, non-tender MSK/SPINE:  No gross deformities, no edema  SKIN:  no rash, atraumatic   *Additional and/or pertinent findings included in MDM below  Diagnostic and Interventional Summary    EKG Interpretation Date/Time:  Friday October 28 2022 19:53:12 EDT Ventricular Rate:  79 PR Interval:  174 QRS Duration:  98 QT Interval:  384 QTC Calculation: 440 R Axis:   90  Text Interpretation: Normal sinus rhythm Rightward axis Cannot rule out Anterior infarct , age undetermined Abnormal ECG No significant change since last tracing Confirmed by Albertus Hughs [phone]) on 10/28/2022 10:35:28 PM       Labs Reviewed  BASIC METABOLIC PANEL - Abnormal; Notable for the following components:      Result Value   Sodium 134 (*)    CO2 18 (*)    Glucose, Bld 102 (*)    Creatinine, Ser 1.35 (*)    Calcium  8.8 (*)    GFR, Estimated 57 (*)    All other components within normal limits  CBC - Abnormal; Notable for the following components:   RBC 6.60 (*)    Hemoglobin 18.8 (*)    HCT 56.7 (*)    All other components within normal limits  HEPATIC FUNCTION PANEL - Abnormal; Notable for the following components:   Total Protein 6.2 (*)    Albumin  3.1 (*)    All other components within normal limits  TROPONIN I (HIGH SENSITIVITY) - Abnormal; Notable for the following components:   Troponin I (High Sensitivity) 35 (*)    All other components within normal limits  TROPONIN I (HIGH SENSITIVITY) - Abnormal; Notable for the  following components:   Troponin I (High Sensitivity) 33 (*)    All other components within normal limits  SARS CORONAVIRUS 2 BY RT PCR  CULTURE, BLOOD (ROUTINE X 2)  CULTURE, BLOOD (ROUTINE X 2)  BRAIN NATRIURETIC PEPTIDE  PROTIME-INR  CK  URINALYSIS, ROUTINE W REFLEX MICROSCOPIC  HEPARIN  LEVEL (UNFRACTIONATED)    CT Angio Chest Pulmonary Embolism (PE) W or WO Contrast  Final Result    DG Chest 2 View  Final Result      Medications  heparin  ADULT infusion 100 units/mL (25000 units/250mL) (1,700 Units/hr Intravenous New Bag/Given 10/29/22 0113)  sodium chloride  0.9 % bolus 500 mL (500 mLs Intravenous New Bag/Given 10/29/22 0111)  iohexol  (OMNIPAQUE ) 350 MG/ML injection 75 mL (75 mLs Intravenous Contrast Given 10/29/22 0033)  heparin  bolus via infusion 5,000 Units (5,000 Units Intravenous Bolus from Bag 10/29/22 0113)     Procedures  /  Critical Care .Critical Care  Performed by: Edson Graces, MD Authorized by: Edson Graces, MD   Critical care provider statement:    Critical care time (minutes):  35   Critical care was necessary to treat or prevent imminent or life-threatening deterioration of the following conditions: Pulmonary embolism.   Critical care was time spent personally by me on the following activities:  Development of treatment plan with patient or surrogate, discussions with consultants, evaluation of patient's response to treatment, examination of patient, ordering and review of laboratory studies, ordering and review of radiographic studies, ordering and performing treatments and interventions, pulse oximetry, re-evaluation of patient's condition and review of old charts   ED Course and Medical Decision Making  Initial Impression and Ddx Differential diagnosis includes COVID-19, pneumonia, viral illness, PE is also considered.  Past medical/surgical history that increases complexity of ED encounter: Tachyarrhythmia status post ablation  Interpretation of  Diagnostics I personally reviewed the EKG and my interpretation is as follows: Sinus rhythm  Labs  overall reassuring with no significant blood count or electrolyte disturbance, mild acidosis, mild elevation in creatinine, troponin minimally elevated.  CTA revealing a segmental pulmonary embolism without heart strain.  Patient Reassessment and Ultimate Disposition/Management     Patient had oxygen saturations in the upper 80s and is now on 2 L nasal cannula.  Will admit to medicine.  Patient management required discussion with the following services or consulting groups:  Hospitalist Service  Complexity of Problems Addressed Acute illness or injury that poses threat of life of bodily function  Additional Data Reviewed and Analyzed Further history obtained from: Further history from spouse/family member  Additional Factors Impacting ED Encounter Risk Consideration of hospitalization  Merrick Abe. Harless Lien, MD [facility] Emergency Medicine [facility] [email or web address]  Final Clinical Impressions(s) / ED Diagnoses     ICD-10-CM   1. PE (pulmonary thromboembolism) (HCC)  I26.99       ED Discharge Orders     None        Discharge Instructions Discussed with and Provided to Patient:   Discharge Instructions   None      Edson Graces, MD 10/29/22 [phone]

## 2022-10-28 NOTE — ED Triage Notes (Signed)
Patient reports worsening SOB with generalized weakness/fatigue onset last week , his cardiologist is Dr. Tresa Endo .

## 2022-10-29 ENCOUNTER — Encounter (HOSPITAL_COMMUNITY): Payer: Self-pay | Admitting: Emergency Medicine

## 2022-10-29 ENCOUNTER — Observation Stay (HOSPITAL_COMMUNITY): Payer: Managed Care, Other (non HMO)

## 2022-10-29 ENCOUNTER — Observation Stay (HOSPITAL_BASED_OUTPATIENT_CLINIC_OR_DEPARTMENT_OTHER): Payer: Managed Care, Other (non HMO)

## 2022-10-29 ENCOUNTER — Emergency Department (HOSPITAL_COMMUNITY): Payer: Managed Care, Other (non HMO)

## 2022-10-29 DIAGNOSIS — I2602 Saddle embolus of pulmonary artery with acute cor pulmonale: Secondary | ICD-10-CM

## 2022-10-29 DIAGNOSIS — E8809 Other disorders of plasma-protein metabolism, not elsewhere classified: Secondary | ICD-10-CM | POA: Diagnosis not present

## 2022-10-29 DIAGNOSIS — I2699 Other pulmonary embolism without acute cor pulmonale: Secondary | ICD-10-CM | POA: Diagnosis present

## 2022-10-29 DIAGNOSIS — Z8546 Personal history of malignant neoplasm of prostate: Secondary | ICD-10-CM | POA: Diagnosis not present

## 2022-10-29 DIAGNOSIS — I2510 Atherosclerotic heart disease of native coronary artery without angina pectoris: Secondary | ICD-10-CM | POA: Diagnosis present

## 2022-10-29 DIAGNOSIS — R7989 Other specified abnormal findings of blood chemistry: Secondary | ICD-10-CM | POA: Diagnosis present

## 2022-10-29 DIAGNOSIS — D751 Secondary polycythemia: Secondary | ICD-10-CM | POA: Diagnosis present

## 2022-10-29 DIAGNOSIS — E669 Obesity, unspecified: Secondary | ICD-10-CM | POA: Diagnosis present

## 2022-10-29 DIAGNOSIS — N179 Acute kidney failure, unspecified: Secondary | ICD-10-CM | POA: Diagnosis present

## 2022-10-29 DIAGNOSIS — Y838 Other surgical procedures as the cause of abnormal reaction of the patient, or of later complication, without mention of misadventure at the time of the procedure: Secondary | ICD-10-CM | POA: Diagnosis present

## 2022-10-29 DIAGNOSIS — Z955 Presence of coronary angioplasty implant and graft: Secondary | ICD-10-CM | POA: Diagnosis not present

## 2022-10-29 DIAGNOSIS — E860 Dehydration: Secondary | ICD-10-CM | POA: Diagnosis not present

## 2022-10-29 DIAGNOSIS — I119 Hypertensive heart disease without heart failure: Secondary | ICD-10-CM | POA: Diagnosis present

## 2022-10-29 DIAGNOSIS — E871 Hypo-osmolality and hyponatremia: Secondary | ICD-10-CM | POA: Diagnosis present

## 2022-10-29 DIAGNOSIS — E782 Mixed hyperlipidemia: Secondary | ICD-10-CM

## 2022-10-29 DIAGNOSIS — E872 Acidosis, unspecified: Secondary | ICD-10-CM | POA: Diagnosis present

## 2022-10-29 DIAGNOSIS — F411 Generalized anxiety disorder: Secondary | ICD-10-CM | POA: Diagnosis present

## 2022-10-29 DIAGNOSIS — Z96642 Presence of left artificial hip joint: Secondary | ICD-10-CM | POA: Diagnosis present

## 2022-10-29 DIAGNOSIS — Z6832 Body mass index (BMI) 32.0-32.9, adult: Secondary | ICD-10-CM | POA: Diagnosis not present

## 2022-10-29 DIAGNOSIS — T81718A Complication of other artery following a procedure, not elsewhere classified, initial encounter: Secondary | ICD-10-CM | POA: Diagnosis present

## 2022-10-29 DIAGNOSIS — J9601 Acute respiratory failure with hypoxia: Secondary | ICD-10-CM | POA: Diagnosis present

## 2022-10-29 DIAGNOSIS — Z1152 Encounter for screening for COVID-19: Secondary | ICD-10-CM | POA: Diagnosis not present

## 2022-10-29 DIAGNOSIS — I10 Essential (primary) hypertension: Secondary | ICD-10-CM | POA: Diagnosis not present

## 2022-10-29 DIAGNOSIS — Z86711 Personal history of pulmonary embolism: Secondary | ICD-10-CM

## 2022-10-29 DIAGNOSIS — Z8249 Family history of ischemic heart disease and other diseases of the circulatory system: Secondary | ICD-10-CM | POA: Diagnosis not present

## 2022-10-29 DIAGNOSIS — E785 Hyperlipidemia, unspecified: Secondary | ICD-10-CM | POA: Diagnosis present

## 2022-10-29 DIAGNOSIS — Z23 Encounter for immunization: Secondary | ICD-10-CM | POA: Diagnosis not present

## 2022-10-29 DIAGNOSIS — Z951 Presence of aortocoronary bypass graft: Secondary | ICD-10-CM | POA: Diagnosis not present

## 2022-10-29 DIAGNOSIS — I4891 Unspecified atrial fibrillation: Secondary | ICD-10-CM | POA: Diagnosis present

## 2022-10-29 DIAGNOSIS — Z79899 Other long term (current) drug therapy: Secondary | ICD-10-CM | POA: Diagnosis not present

## 2022-10-29 LAB — CBC WITH DIFFERENTIAL/PLATELET
Abs Immature Granulocytes: 0.02 10*3/uL (ref 0.00–0.07)
Basophils Absolute: 0 10*3/uL (ref 0.0–0.1)
Basophils Relative: 1 %
Eosinophils Absolute: 0.2 10*3/uL (ref 0.0–0.5)
Eosinophils Relative: 3 %
HCT: 54.8 % — ABNORMAL HIGH (ref 39.0–52.0)
Hemoglobin: 18.1 g/dL — ABNORMAL HIGH (ref 13.0–17.0)
Immature Granulocytes: 0 %
Lymphocytes Relative: 17 %
Lymphs Abs: 1 10*3/uL (ref 0.7–4.0)
MCH: 29.3 pg (ref 26.0–34.0)
MCHC: 33 g/dL (ref 30.0–36.0)
MCV: 88.8 fL (ref 80.0–100.0)
Monocytes Absolute: 0.3 10*3/uL (ref 0.1–1.0)
Monocytes Relative: 6 %
Neutro Abs: 4.2 10*3/uL (ref 1.7–7.7)
Neutrophils Relative %: 73 %
Platelets: 176 10*3/uL (ref 150–400)
RBC: 6.17 MIL/uL — ABNORMAL HIGH (ref 4.22–5.81)
RDW: 14.2 % (ref 11.5–15.5)
WBC: 5.8 10*3/uL (ref 4.0–10.5)
nRBC: 0 % (ref 0.0–0.2)

## 2022-10-29 LAB — CK: Total CK: 363 U/L (ref 49–397)

## 2022-10-29 LAB — MRSA NEXT GEN BY PCR, NASAL: MRSA by PCR Next Gen: NOT DETECTED

## 2022-10-29 LAB — COMPREHENSIVE METABOLIC PANEL
ALT: 22 U/L (ref 0–44)
AST: 30 U/L (ref 15–41)
Albumin: 3 g/dL — ABNORMAL LOW (ref 3.5–5.0)
Alkaline Phosphatase: 60 U/L (ref 38–126)
Anion gap: 9 (ref 5–15)
BUN: 11 mg/dL (ref 8–23)
CO2: 24 mmol/L (ref 22–32)
Calcium: 8.3 mg/dL — ABNORMAL LOW (ref 8.9–10.3)
Chloride: 101 mmol/L (ref 98–111)
Creatinine, Ser: 1.51 mg/dL — ABNORMAL HIGH (ref 0.61–1.24)
GFR, Estimated: 50 mL/min — ABNORMAL LOW (ref >60)
Glucose, Bld: 125 mg/dL — ABNORMAL HIGH (ref 70–99)
Potassium: 4.1 mmol/L (ref 3.5–5.1)
Sodium: 134 mmol/L — ABNORMAL LOW (ref 135–145)
Total Bilirubin: 0.4 mg/dL (ref 0.3–1.2)
Total Protein: 5.8 g/dL — ABNORMAL LOW (ref 6.5–8.1)

## 2022-10-29 LAB — URINALYSIS, ROUTINE W REFLEX MICROSCOPIC
Bacteria, UA: NONE SEEN
Bilirubin Urine: NEGATIVE
Glucose, UA: NEGATIVE mg/dL
Ketones, ur: NEGATIVE mg/dL
Leukocytes,Ua: NEGATIVE
Nitrite: NEGATIVE
Protein, ur: NEGATIVE mg/dL
Specific Gravity, Urine: >1.046 — ABNORMAL HIGH (ref 1.005–1.030)
pH: 5 (ref 5.0–8.0)

## 2022-10-29 LAB — HEPATIC FUNCTION PANEL
ALT: 23 U/L (ref 0–44)
AST: 29 U/L (ref 15–41)
Albumin: 3.1 g/dL — ABNORMAL LOW (ref 3.5–5.0)
Alkaline Phosphatase: 63 U/L (ref 38–126)
Bilirubin, Direct: 0.2 mg/dL (ref 0.0–0.2)
Indirect Bilirubin: 0.7 mg/dL (ref 0.3–0.9)
Total Bilirubin: 0.9 mg/dL (ref 0.3–1.2)
Total Protein: 6.2 g/dL — ABNORMAL LOW (ref 6.5–8.1)

## 2022-10-29 LAB — ECHOCARDIOGRAM COMPLETE
Area-P 1/2: 2.39 cm2
Height: 71 in
S' Lateral: 3.8 cm
Weight: 3583.8 oz

## 2022-10-29 LAB — MAGNESIUM: Magnesium: 2.2 mg/dL (ref 1.7–2.4)

## 2022-10-29 LAB — HEPARIN LEVEL (UNFRACTIONATED)
Heparin Unfractionated: 0.37 [IU]/mL (ref 0.30–0.70)
Heparin Unfractionated: 0.26 [IU]/mL — ABNORMAL LOW (ref 0.30–0.70)

## 2022-10-29 LAB — SARS CORONAVIRUS 2 BY RT PCR: SARS Coronavirus 2 by RT PCR: NEGATIVE

## 2022-10-29 LAB — PHOSPHORUS: Phosphorus: 3.2 mg/dL (ref 2.5–4.6)

## 2022-10-29 MED ORDER — ACETAMINOPHEN 650 MG RE SUPP: 650.0000 mg | Freq: Four times a day (QID) | RECTAL | Status: DC | PRN

## 2022-10-29 MED ORDER — HEPARIN (PORCINE) 25000 UT/250ML-% IV SOLN
2000.0000 [IU]/h | INTRAVENOUS | Status: AC
  Administered 2022-10-29: 1700 [IU]/h via INTRAVENOUS
  Administered 2022-10-29: 1800 [IU]/h via INTRAVENOUS
  Administered 2022-10-30 – 2022-11-01 (×5): 2000 [IU]/h via INTRAVENOUS
  Filled 2022-10-29 (×7): qty 250

## 2022-10-29 MED ORDER — ONDANSETRON HCL 4 MG/2ML IJ SOLN: 4.0000 mg | Freq: Four times a day (QID) | INTRAMUSCULAR | Status: DC | PRN

## 2022-10-29 MED ORDER — MELATONIN 3 MG PO TABS
3.0000 mg | ORAL_TABLET | Freq: Every evening | ORAL | Status: DC | PRN
  Administered 2022-10-29 – 2022-11-01 (×4): 3 mg via ORAL
  Filled 2022-10-29 (×4): qty 1

## 2022-10-29 MED ORDER — HEPARIN BOLUS VIA INFUSION
5000.0000 [IU] | Freq: Once | INTRAVENOUS | Status: AC
  Administered 2022-10-29: 5000 [IU] via INTRAVENOUS
  Filled 2022-10-29: qty 5000

## 2022-10-29 MED ORDER — PNEUMOCOCCAL 20-VAL CONJ VACC 0.5 ML IM SUSY
0.5000 mL | PREFILLED_SYRINGE | INTRAMUSCULAR | Status: AC
  Administered 2022-11-02: 0.5 mL via INTRAMUSCULAR
  Filled 2022-10-29: qty 0.5

## 2022-10-29 MED ORDER — IOHEXOL 350 MG/ML SOLN
75.0000 mL | Freq: Once | INTRAVENOUS | Status: AC | PRN
  Administered 2022-10-29: 75 mL via INTRAVENOUS

## 2022-10-29 MED ORDER — FENTANYL CITRATE PF 50 MCG/ML IJ SOSY: 12.5000 ug | PREFILLED_SYRINGE | INTRAMUSCULAR | Status: DC | PRN

## 2022-10-29 MED ORDER — EZETIMIBE 10 MG PO TABS
10.0000 mg | ORAL_TABLET | Freq: Every day | ORAL | Status: DC
  Administered 2022-10-29 – 2022-11-02 (×5): 10 mg via ORAL
  Filled 2022-10-29 (×5): qty 1

## 2022-10-29 MED ORDER — ACETAMINOPHEN 325 MG PO TABS
650.0000 mg | ORAL_TABLET | Freq: Four times a day (QID) | ORAL | Status: DC | PRN
  Administered 2022-10-29 – 2022-11-02 (×7): 650 mg via ORAL
  Filled 2022-10-29 (×7): qty 2

## 2022-10-29 MED ORDER — GUAIFENESIN-DM 100-10 MG/5ML PO SYRP
5.0000 mL | ORAL_SOLUTION | ORAL | Status: DC | PRN
  Administered 2022-10-29: 5 mL via ORAL
  Filled 2022-10-29: qty 5

## 2022-10-29 MED ORDER — HEPARIN BOLUS VIA INFUSION
1500.0000 [IU] | Freq: Once | INTRAVENOUS | Status: AC
  Administered 2022-10-29: 1500 [IU] via INTRAVENOUS
  Filled 2022-10-29: qty 1500

## 2022-10-29 MED ORDER — CLONAZEPAM 0.5 MG PO TABS
0.5000 mg | ORAL_TABLET | Freq: Two times a day (BID) | ORAL | Status: DC | PRN
  Administered 2022-10-29 – 2022-11-01 (×4): 0.5 mg via ORAL
  Filled 2022-10-29 (×4): qty 1

## 2022-10-29 MED ORDER — NALOXONE HCL 0.4 MG/ML IJ SOLN: 0.4000 mg | INTRAMUSCULAR | Status: DC | PRN

## 2022-10-29 NOTE — Progress Notes (Signed)
PROGRESS NOTE    Jacob Collier  QVZ:[id number] DOB: 1956-02-18 DOA: 10/28/2022 PCP: Blane Ohara, MD   Brief Narrative:  The patient is a 67 year old obese Caucasian male with a past medical history significant for but not limited too essential hypertension, hyperlipidemia, paroxysmal SVT status post recent ablation as well as other comorbidities who was admitted to [facility], ED for shortness of breath and was found to have an acute PE.  He underwent a ablation for paroxysmal SVT at [facility] on 8 15-8 16 and subsequently the last few days developed shortness of breath but subjective fever and a nonproductive cough.  Denied any PND, worsening of peripheral edema and denied any history of DVT or PE.  Has been on daily aspirin as an outpatient normally.  Further workup showed that he had an acute PE and echocardiogram and extremity venous duplex have been ordered.  Will continue heparin drip and anticipating transition to oral anticoagulation in the morning.  Assessment and Plan:  Acute PE -Presented with 1 to 2 days of progressive shortness of breath associated with subjective fevers, new cough and new supplemental oxygen requirement -Previously underwent a ablation procedure for tachyarrhythmia -CTA of the chest done and showed "Right middle lobe segmental pulmonary artery embolism. No CT evidence of right heart straining. Trace bilateral pleural effusions and bibasilar subpleural atelectasis.  Aortic Atherosclerosis" -Placed on Heparin gtt and continue for now and will need to Transition to DOAC -Check ECHOCardiogram and LE Venous Duplex -ECHO done and show "Left ventricular ejection fraction, by estimation, is 50 to 55%. Left ventricular ejection fraction by PLAX is 54 %. The left ventricle has low normal function. The left ventricle has no regional wall motion abnormalities. There is mild concentric left ventricular hypertrophy. Left ventricular diastolic parameters are consistent with Grade  I diastolic dysfunction (impaired relaxation). There is moderate hypokinesis of the left ventricular, basal-mid inferior wall. Right ventricular systolic function is mildly reduced. The right ventricular size is mildly enlarged. There is normal pulmonary artery systolic pressure. The estimated right ventricular systolic pressure is 22.0 mmHg. Possible atrial level shunting consistent with recent ablation. cannot  exclude a small PFO. The mitral valve is normal in structure. Trivial mitral valve regurgitation. The aortic valve is tricuspid. Aortic valve regurgitation is not visualized. There is borderline dilatation of the ascending aorta, measuring 39 mm. The inferior vena cava is normal in size with greater than 50% respiratory variability, suggesting right atrial pressure of 3 mmHg. Comparison(s): Changes from prior study are noted. 01/19/2021 (TEE): LVEF 50%, inferior wall hypokinesis." -Troponin went from 35 -> 33 -LE VENOUS showed "RIGHT: There is no evidence of deep vein thrombosis in the lower extremity. No cystic structure found in the popliteal fossa. LEFT: There is no evidence of deep vein thrombosis in the lower extremity. No cystic structure found in the popliteal fossa." -PESI Class at Least III given Age, Sex, and Hypoxia on admission -Patient will need ambulatory home O2 screen and transition to oral anticoagulation likely in the morning  Acute Respiratory Failure with Hypoxia -Patient presents with SOB and Room Air Saturation <90% (80's ) -SpO2: 98 % O2 Flow Rate (L/min): 2 L/min -C/w Guaifenesin-Dextromethorphan 5 mL po q4hprn Cough -Continuous Pulse Oximetry and Maintain O2 Saturations >90% -Continue Supplemental O2 via [place] and [place] O2 as Tolerated -Will need an Ambulatory Home O2 Screen prior to D/C and repeat CXR in the AM  Hyponatremia -Na+ Trend: Recent Labs  Lab 10/04/22 1530 10/28/22 2021 10/29/22 0336  NA  142 134* 134*  -Continue to Monitor and Trend and Repeat CMP in  the AM  AKI Metabolic Acidosis -BUN/Cr Trend: Recent Labs  Lab 10/04/22 1530 10/28/22 2021 10/29/22 0336  BUN 19 11 11   CREATININE 1.14 1.35* 1.51*  -Patient has a CO2 of 18, AG of 15, and a Chloride Level of 101 on Admission -U/A relatively unremarkable except Small Hgb on Urine Dipstick -Avoid Nephrotoxic Medications, Contrast Dyes, Hypotension and Dehydration to Ensure Adequate Renal Perfusion and will need to Renally Adjust Meds -Continue to Monitor and Trend Renal Function carefully and repeat CMP in the AM   Erythrocytosis -Hgb/Hct Trend: Recent Labs  Lab 10/04/22 1530 10/28/22 2021 10/28/22 2021 10/29/22 0336  HGB 16.5 18.8*  --  18.1*  HCT 50.1 56.7*   < > 54.8*  MCV 90 85.9   < > 88.8   < > = values in this interval not displayed.  -Continue to Monitor and Trend  GAD -C/w Home Clonazepam 0.5 mg po BIDprn Anxiety  -Not on any Scheduled SSRI's or SNRI's  Essential Hypertension -Holding his home amlodipine for now given his acute pulmonary embolism -Continue to monitor blood pressures per protocol -Last blood pressure reading was 120/69  HLD -C/w Home Ezetimibe 10 mg po Daily  Hypoalbuminemia -Patient's Albumin Trend: Recent Labs  Lab 10/29/22 0011 10/29/22 0336  ALBUMIN 3.1* 3.0*  -Continue to Monitor and Trend and repeat CMP in the AM  Obesity -Complicates overall prognosis and care -Estimated body mass index is 31.24 kg/m as calculated from the following:   Height as of this encounter: 5\' 11"  (1.803 m).   Weight as of this encounter: 101.6 kg.  -Weight Loss and Dietary Counseling given   DVT prophylaxis: Anticoagulated with Heparin gtt    Code Status: Full Code Family Communication: No family present at bedside  Disposition Plan:  Level of care: Telemetry Medical Status is: Observation The patient will require care spanning > 2 midnights and should be moved to inpatient because:    Consultants:  None  Procedures:   ECHOCARDIOGRAM IMPRESSIONS     1. Left ventricular ejection fraction, by estimation, is 50 to 55%. Left  ventricular ejection fraction by PLAX is 54 %. The left ventricle has low  normal function. The left ventricle has no regional wall motion  abnormalities. There is mild concentric  left ventricular hypertrophy. Left ventricular diastolic parameters are  consistent with Grade I diastolic dysfunction (impaired relaxation). There  is moderate hypokinesis of the left ventricular, basal-mid inferior wall.   2. Right ventricular systolic function is mildly reduced. The right  ventricular size is mildly enlarged. There is normal pulmonary artery  systolic pressure. The estimated right ventricular systolic pressure is  22.0 mmHg.   3. Possible atrial level shunting consistent with recent ablation. cannot  exclude a small PFO.   4. The mitral valve is normal in structure. Trivial mitral valve  regurgitation.   5. The aortic valve is tricuspid. Aortic valve regurgitation is not  visualized.   6. There is borderline dilatation of the ascending aorta, measuring 39  mm.   7. The inferior vena cava is normal in size with greater than 50%  respiratory variability, suggesting right atrial pressure of 3 mmHg.   Comparison(s): Changes from prior study are noted. 01/19/2021 (TEE): LVEF  50%, inferior wall hypokinesis.   FINDINGS   Left Ventricle: Left ventricular ejection fraction, by estimation, is 50  to 55%. Left ventricular ejection fraction by PLAX is 54 %. The left  ventricle has low normal function. The left ventricle has no regional wall  motion abnormalities. Moderate  hypokinesis of the left ventricular, basal-mid inferior wall. The left  ventricular internal cavity size was normal in size. There is mild  concentric left ventricular hypertrophy. Left ventricular diastolic  parameters are consistent with Grade I diastolic   dysfunction (impaired relaxation). Indeterminate filling  pressures.     LV Wall Scoring:  The posterior wall is hypokinetic.   Right Ventricle: The right ventricular size is mildly enlarged. No  increase in right ventricular wall thickness. Right ventricular systolic  function is mildly reduced. There is normal pulmonary artery systolic  pressure. The tricuspid regurgitant velocity   is 2.18 m/s, and with an assumed right atrial pressure of 3 mmHg, the  estimated right ventricular systolic pressure is 22.0 mmHg.   Left Atrium: Left atrial size was normal in size.   Right Atrium: Right atrial size was normal in size.   Pericardium: There is no evidence of pericardial effusion.   Mitral Valve: The mitral valve is normal in structure. Trivial mitral  valve regurgitation.   Tricuspid Valve: The tricuspid valve is normal in structure. Tricuspid  valve regurgitation is trivial.   Aortic Valve: The aortic valve is tricuspid. Aortic valve regurgitation is  not visualized.   Pulmonic Valve: The pulmonic valve was normal in structure. Pulmonic valve  regurgitation is trivial.   Aorta: The aortic root is normal in size and structure. There is  borderline dilatation of the ascending aorta, measuring 39 mm.   Venous: The inferior vena cava is normal in size with greater than 50%  respiratory variability, suggesting right atrial pressure of 3 mmHg.   IAS/Shunts: The interatrial septum appears to be lipomatous. Cannot  exclude a small PFO.     LEFT VENTRICLE  PLAX 2D  LV EF:         Left            Diastology                 ventricular     LV e' medial:    5.22 cm/s                 ejection        LV E/e' medial:  7.8                 fraction by     LV e' lateral:   11.70 cm/s                 PLAX is 54      LV E/e' lateral: 3.5                 %.  LVIDd:         5.30 cm  LVIDs:         3.80 cm  LV PW:         1.20 cm  LV IVS:        1.10 cm  LVOT diam:     1.90 cm  LV SV:         65  LV SV Index:   29  LVOT Area:     2.84 cm      RIGHT VENTRICLE  RV S prime:     8.49 cm/s  TAPSE (M-mode): 1.4 cm   LEFT ATRIUM             Index  RIGHT ATRIUM           Index  LA diam:        4.00 cm 1.81 cm/m   RA Area:     20.90 cm  LA Vol (A2C):   63.9 ml 28.88 ml/m  RA Volume:   52.10 ml  23.55 ml/m  LA Vol (A4C):   74.2 ml 33.54 ml/m  LA Biplane Vol: 71.3 ml 32.23 ml/m   AORTIC VALVE  LVOT Vmax:   139.00 cm/s  LVOT Vmean:  98.200 cm/s  LVOT VTI:    0.230 m    AORTA  Ao Root diam: 3.50 cm  Ao Asc diam:  3.90 cm   MITRAL VALVE               TRICUSPID VALVE  MV Area (PHT): 2.39 cm    TR Peak grad:   19.0 mmHg  MV Decel Time: 318 msec    TR Vmax:        218.00 cm/s  MV E velocity: 40.60 cm/s  MV A velocity: 55.00 cm/s  SHUNTS  MV E/A ratio:  0.74        Systemic VTI:  0.23 m                             Systemic Diam: 1.90 cm   LE VENOUS DUPLEX Summary:  RIGHT:   - There is no evidence of deep vein thrombosis in the lower extremity.    - No cystic structure found in the popliteal fossa.    LEFT:    - There is no evidence of deep vein thrombosis in the lower extremity.    - No cystic structure found in the popliteal fossa.     *See table(s) above for measurements and observations.   Antimicrobials:  Anti-infectives (From admission, onward)    None      Objective: Vitals:   10/29/22 0300 10/29/22 0822 10/29/22 1109 10/29/22 1200  BP: (!) 151/83 110/71 120/69   Pulse: 76     Resp: 18 (!) 25 (!) 25   Temp: 97.8 F (36.6 C) 98.7 F (37.1 C) 98.3 F (36.8 C)   TempSrc: Oral Oral Oral   SpO2: 98%   90%  Weight:      Height:        Intake/Output Summary (Last 24 hours) at 10/29/2022 1333 Last data filed at 10/29/2022 1200 Gross per 24 hour  Intake 1174.85 ml  Output 100 ml  Net 1074.85 ml   Filed Weights   10/29/22 0117 10/29/22 0246  Weight: 100.7 kg 101.6 kg   Data Reviewed: I have personally reviewed following labs and imaging studies  CBC: Recent Labs  Lab  10/28/22 2021 10/29/22 0336  WBC 5.3 5.8  NEUTROABS  --  4.2  HGB 18.8* 18.1*  HCT 56.7* 54.8*  MCV 85.9 88.8  PLT 228 176   Basic Metabolic Panel: Recent Labs  Lab 10/28/22 2021 10/29/22 0336  NA 134* 134*  K 4.3 4.1  CL 101 101  CO2 18* 24  GLUCOSE 102* 125*  BUN 11 11  CREATININE 1.35* 1.51*  CALCIUM 8.8* 8.3*  MG  --  2.2  PHOS  --  3.2   GFR: Estimated Creatinine Clearance: 56.8 mL/min (A) (by C-G formula based on SCr of 1.51 mg/dL (H)). Liver Function Tests: Recent Labs  Lab 10/29/22 0011 10/29/22 0336  AST 29 30  ALT 23 22  ALKPHOS  63 60  BILITOT 0.9 0.4  PROT 6.2* 5.8*  ALBUMIN 3.1* 3.0*   No results for input(s): "LIPASE", "AMYLASE" in the last 168 hours. No results for input(s): "AMMONIA" in the last 168 hours. Coagulation Profile: Recent Labs  Lab 10/28/22 2227  INR 1.1   Cardiac Enzymes: Recent Labs  Lab 10/29/22 0011  CKTOTAL 363   BNP (last 3 results) No results for input(s): "PROBNP" in the last 8760 hours. HbA1C: No results for input(s): "HGBA1C" in the last 72 hours. CBG: No results for input(s): "GLUCAP" in the last 168 hours. Lipid Profile: No results for input(s): "CHOL", "HDL", "LDLCALC", "TRIG", "CHOLHDL", "LDLDIRECT" in the last 72 hours. Thyroid Function Tests: No results for input(s): "TSH", "T4TOTAL", "FREET4", "T3FREE", "THYROIDAB" in the last 72 hours. Anemia Panel: No results for input(s): "VITAMINB12", "FOLATE", "FERRITIN", "TIBC", "IRON", "RETICCTPCT" in the last 72 hours. Sepsis Labs: No results for input(s): "PROCALCITON", "LATICACIDVEN" in the last 168 hours.  Recent Results (from the past 240 hour(s))  Culture, blood (Routine X 2) w Reflex to ID Panel     Status: None (Preliminary result)   Collection Time: 10/29/22 12:11 AM   Specimen: BLOOD  Result Value Ref Range Status   Specimen Description BLOOD BLOOD LEFT ARM  Final   Special Requests   Final    BOTTLES DRAWN AEROBIC AND ANAEROBIC Blood Culture  adequate volume   Culture   Final    NO GROWTH < 12 HOURS Performed at [facility], [address] [place], [place] [address]    Report Status PENDING  Incomplete  Culture, blood (Routine X 2) w Reflex to ID Panel     Status: None (Preliminary result)   Collection Time: 10/29/22 12:11 AM   Specimen: BLOOD  Result Value Ref Range Status   Specimen Description BLOOD BLOOD RIGHT HAND  Final   Special Requests   Final    BOTTLES DRAWN AEROBIC AND ANAEROBIC Blood Culture adequate volume   Culture   Final    NO GROWTH < 12 HOURS Performed at [facility], [address] [place], [place] [address]    Report Status PENDING  Incomplete  SARS Coronavirus 2 by RT PCR (hospital order, performed in [facility] hospital lab) *cepheid single result test* Anterior Nasal Swab     Status: None   Collection Time: 10/29/22  1:15 AM   Specimen: Anterior Nasal Swab  Result Value Ref Range Status   SARS Coronavirus 2 by RT PCR NEGATIVE NEGATIVE Final    Comment: Performed at [facility], [address], [place], [place] [address]  MRSA Next Gen by PCR, Nasal     Status: None   Collection Time: 10/29/22  2:37 AM   Specimen: Nasal Mucosa; Nasal Swab  Result Value Ref Range Status   MRSA by PCR Next Gen NOT DETECTED NOT DETECTED Final    Comment: (NOTE) The GeneXpert MRSA Assay (FDA approved for NASAL specimens only), is one component of a comprehensive MRSA colonization surveillance program. It is not intended to diagnose MRSA infection nor to guide or monitor treatment for MRSA infections. Test performance is not FDA approved in patients less than 65 years old. Performed at [facility], [address], [place], [place] [address]     Radiology Studies: ECHOCARDIOGRAM COMPLETE  Result Date: 10/29/2022    ECHOCARDIOGRAM REPORT   Patient Name:   Jacob Collier Date of Exam: 10/29/2022 Medical Rec #:  [id number]  Height:       71.0 in Accession #:    [id number]     Weight:        224.0 lb Date of Birth:  [birth year]     BSA:          2.213 m Patient Age:    66 years       BP:           120/71 mmHg Patient Gender: M              HR:           80 bpm. Exam Location:  Inpatient Procedure: 2D Echo, Color Doppler and Cardiac Doppler Indications:    I26.02 Pulmonary embolus  History:        Patient has prior history of Echocardiogram examinations, most                 recent 01/15/2021. Prior CABG; Risk Factors:Hypertension and                 Dyslipidemia.  Sonographer:    Irving Burton Senior RDCS Referring Phys: [id number] Angie Fava IMPRESSIONS  1. Left ventricular ejection fraction, by estimation, is 50 to 55%. Left ventricular ejection fraction by PLAX is 54 %. The left ventricle has low normal function. The left ventricle has no regional wall motion abnormalities. There is mild concentric left ventricular hypertrophy. Left ventricular diastolic parameters are consistent with Grade I diastolic dysfunction (impaired relaxation). There is moderate hypokinesis of the left ventricular, basal-mid inferior wall.  2. Right ventricular systolic function is mildly reduced. The right ventricular size is mildly enlarged. There is normal pulmonary artery systolic pressure. The estimated right ventricular systolic pressure is 22.0 mmHg.  3. Possible atrial level shunting consistent with recent ablation. cannot exclude a small PFO.  4. The mitral valve is normal in structure. Trivial mitral valve regurgitation.  5. The aortic valve is tricuspid. Aortic valve regurgitation is not visualized.  6. There is borderline dilatation of the ascending aorta, measuring 39 mm.  7. The inferior vena cava is normal in size with greater than 50% respiratory variability, suggesting right atrial pressure of 3 mmHg. Comparison(s): Changes from prior study are noted. 01/19/2021 (TEE): LVEF 50%, inferior wall hypokinesis. FINDINGS  Left Ventricle: Left ventricular ejection fraction, by estimation, is 50 to 55%. Left  ventricular ejection fraction by PLAX is 54 %. The left ventricle has low normal function. The left ventricle has no regional wall motion abnormalities. Moderate hypokinesis of the left ventricular, basal-mid inferior wall. The left ventricular internal cavity size was normal in size. There is mild concentric left ventricular hypertrophy. Left ventricular diastolic parameters are consistent with Grade I diastolic  dysfunction (impaired relaxation). Indeterminate filling pressures.  LV Wall Scoring: The posterior wall is hypokinetic. Right Ventricle: The right ventricular size is mildly enlarged. No increase in right ventricular wall thickness. Right ventricular systolic function is mildly reduced. There is normal pulmonary artery systolic pressure. The tricuspid regurgitant velocity  is 2.18 m/s, and with an assumed right atrial pressure of 3 mmHg, the estimated right ventricular systolic pressure is 22.0 mmHg. Left Atrium: Left atrial size was normal in size. Right Atrium: Right atrial size was normal in size. Pericardium: There is no evidence of pericardial effusion. Mitral Valve: The mitral valve is normal in structure. Trivial mitral valve regurgitation. Tricuspid Valve: The tricuspid valve is normal in structure. Tricuspid valve regurgitation is trivial. Aortic Valve: The aortic valve is tricuspid. Aortic valve regurgitation is  not visualized. Pulmonic Valve: The pulmonic valve was normal in structure. Pulmonic valve regurgitation is trivial. Aorta: The aortic root is normal in size and structure. There is borderline dilatation of the ascending aorta, measuring 39 mm. Venous: The inferior vena cava is normal in size with greater than 50% respiratory variability, suggesting right atrial pressure of 3 mmHg. IAS/Shunts: The interatrial septum appears to be lipomatous. Cannot exclude a small PFO.  LEFT VENTRICLE PLAX 2D LV EF:         Left            Diastology                ventricular     LV e' medial:    5.22  cm/s                ejection        LV E/e' medial:  7.8                fraction by     LV e' lateral:   11.70 cm/s                PLAX is 54      LV E/e' lateral: 3.5                %. LVIDd:         5.30 cm LVIDs:         3.80 cm LV PW:         1.20 cm LV IVS:        1.10 cm LVOT diam:     1.90 cm LV SV:         65 LV SV Index:   29 LVOT Area:     2.84 cm  RIGHT VENTRICLE RV S prime:     8.49 cm/s TAPSE (M-mode): 1.4 cm LEFT ATRIUM             Index        RIGHT ATRIUM           Index LA diam:        4.00 cm 1.81 cm/m   RA Area:     20.90 cm LA Vol (A2C):   63.9 ml 28.88 ml/m  RA Volume:   52.10 ml  23.55 ml/m LA Vol (A4C):   74.2 ml 33.54 ml/m LA Biplane Vol: 71.3 ml 32.23 ml/m  AORTIC VALVE LVOT Vmax:   139.00 cm/s LVOT Vmean:  98.200 cm/s LVOT VTI:    0.230 m  AORTA Ao Root diam: 3.50 cm Ao Asc diam:  3.90 cm MITRAL VALVE               TRICUSPID VALVE MV Area (PHT): 2.39 cm    TR Peak grad:   19.0 mmHg MV Decel Time: 318 msec    TR Vmax:        218.00 cm/s MV E velocity: 40.60 cm/s MV A velocity: 55.00 cm/s  SHUNTS MV E/A ratio:  0.74        Systemic VTI:  0.23 m                            Systemic Diam: 1.90 cm Zoila Shutter MD Electronically signed by Zoila Shutter MD Signature Date/Time: 10/29/2022/1:13:16 PM    Final    VAS [place] LOWER EXTREMITY VENOUS (DVT)  Result Date: 10/29/2022  Lower Venous DVT Study Patient Name:  Jacob  Collier Collier  Date of Exam:   10/29/2022 Medical Rec #: [id number]       Accession #:    [id number] Date of Birth: [birth year]      Patient Gender: M Patient Age:   67 years Exam Location:  [facility] Procedure:      VAS [place] LOWER EXTREMITY VENOUS (DVT) Referring Phys: Marguerita Merles --------------------------------------------------------------------------------  Indications: Pulmonary embolism.  Risk Factors: Confirmed PE. Anticoagulation: Heparin. Comparison Study: No prior studies. Performing Technologist: Chanda Busing RVT  Examination Guidelines: A complete evaluation  includes B-mode imaging, spectral Doppler, color Doppler, and power Doppler as needed of all accessible portions of each vessel. Bilateral testing is considered an integral part of a complete examination. Limited examinations for reoccurring indications may be performed as noted. The reflux portion of the exam is performed with the patient in reverse Trendelenburg.  +---------+---------------+---------+-----------+----------+--------------+ RIGHT    CompressibilityPhasicitySpontaneityPropertiesThrombus Aging +---------+---------------+---------+-----------+----------+--------------+ CFV      Full           Yes      Yes                                 +---------+---------------+---------+-----------+----------+--------------+ SFJ      Full                                                        +---------+---------------+---------+-----------+----------+--------------+ FV Prox  Full                                                        +---------+---------------+---------+-----------+----------+--------------+ FV Mid   Full                                                        +---------+---------------+---------+-----------+----------+--------------+ FV DistalFull                                                        +---------+---------------+---------+-----------+----------+--------------+ PFV      Full                                                        +---------+---------------+---------+-----------+----------+--------------+ POP      Full           Yes      Yes                                 +---------+---------------+---------+-----------+----------+--------------+ PTV      Full                                                        +---------+---------------+---------+-----------+----------+--------------+  PERO     Full                                                         +---------+---------------+---------+-----------+----------+--------------+   +---------+---------------+---------+-----------+----------+--------------+ LEFT     CompressibilityPhasicitySpontaneityPropertiesThrombus Aging +---------+---------------+---------+-----------+----------+--------------+ CFV      Full           Yes      Yes                                 +---------+---------------+---------+-----------+----------+--------------+ SFJ      Full                                                        +---------+---------------+---------+-----------+----------+--------------+ FV Prox  Full                                                        +---------+---------------+---------+-----------+----------+--------------+ FV Mid   Full                                                        +---------+---------------+---------+-----------+----------+--------------+ FV DistalFull                                                        +---------+---------------+---------+-----------+----------+--------------+ PFV      Full                                                        +---------+---------------+---------+-----------+----------+--------------+ POP      Full           Yes      Yes                                 +---------+---------------+---------+-----------+----------+--------------+ PTV      Full                                                        +---------+---------------+---------+-----------+----------+--------------+ PERO     Full                                                        +---------+---------------+---------+-----------+----------+--------------+  Summary: RIGHT: - There is no evidence of deep vein thrombosis in the lower extremity.  - No cystic structure found in the popliteal fossa.  LEFT: - There is no evidence of deep vein thrombosis in the lower extremity.  - No cystic structure found in the popliteal fossa.   *See table(s) above for measurements and observations. Electronically signed by Coral Else MD on 10/29/2022 at 10:34:58 AM.    Final    CT Angio Chest Pulmonary Embolism (PE) W or WO Contrast  Result Date: 10/29/2022 CLINICAL DATA:  Shortness of breath. Concern for pulmonary embolism. EXAM: CT ANGIOGRAPHY CHEST WITH CONTRAST TECHNIQUE: Multidetector CT imaging of the chest was performed using the standard protocol during bolus administration of intravenous contrast. Multiplanar CT image reconstructions and MIPs were obtained to evaluate the vascular anatomy. RADIATION DOSE REDUCTION: This exam was performed according to the departmental dose-optimization program which includes automated exposure control, adjustment of the mA and/or kV according to patient size and/or use of iterative reconstruction technique. CONTRAST:  75mL OMNIPAQUE IOHEXOL 350 MG/ML SOLN COMPARISON:  Chest CT dated 01/06/2022. FINDINGS: Cardiovascular: Mild cardiomegaly. No pericardial effusion. There is coronary vascular calcification and postsurgical changes of CABG. Mild atherosclerotic calcification of the thoracic aorta. No aneurysmal dilatation. Right middle lobe segmental pulmonary artery embolism. No CT evidence of right heart straining. Mediastinum/Nodes: No hilar adenopathy. The esophagus is grossly unremarkable. No mediastinal fluid collection. Lungs/Pleura: Trace bilateral pleural effusions and bibasilar subpleural atelectasis. No pneumothorax. The central airways are patent. Upper Abdomen: No acute abnormality. Musculoskeletal: Degenerative changes of the spine. Median sternotomy wires. No acute osseous pathology. Review of the MIP images confirms the above findings. IMPRESSION: 1. Right middle lobe segmental pulmonary artery embolism. No CT evidence of right heart straining. 2. Trace bilateral pleural effusions and bibasilar subpleural atelectasis. 3.  Aortic Atherosclerosis (ICD10-I70.0). These results were called by  telephone at the time of interpretation on 10/29/2022 at 12:44 am to provider [facility] , who verbally acknowledged these results. Electronically Signed   By: Elgie Collard M.D.   On: 10/29/2022 00:46   DG Chest 2 View  Result Date: 10/28/2022 CLINICAL DATA:  Shortness of breath. EXAM: CHEST - 2 VIEW COMPARISON:  PA Lat 05/14/2022 FINDINGS: The cardiac size is normal. There are old CABG changes. Slight aortic tortuosity with mild atherosclerosis and stable mediastinum. The lungs are clear of infiltrates. A calcified granuloma is again noted in the left lower lung field. Thoracic spondylosis and mild dextroscoliosis. Three level lower cervical ACDF plating is again shown. IMPRESSION: No active cardiopulmonary disease. Old granulomatous disease. Post CABG chest. Electronically Signed   By: Almira Bar M.D.   On: 10/28/2022 21:12    Scheduled Meds:  ezetimibe  10 mg Oral Daily   [START ON 10/30/2022] pneumococcal 20-valent conjugate vaccine  0.5 mL Intramuscular Tomorrow-1000   Continuous Infusions:  heparin 1,800 Units/hr (10/29/22 1309)    LOS: 0 days   Marguerita Merles, DO [facility] Available via Epic secure chat 7am-7pm After these hours, please refer to coverage provider listed on [email or web address] 10/29/2022, 1:33 PM

## 2022-10-29 NOTE — Progress Notes (Signed)
MEWS Progress Note  Patient Details Name: Jacob Collier MRN: [id number] DOB: [birth year] Today's Date: 10/29/2022   MEWS Flowsheet Documentation:  Assess: MEWS Score Temp: (!) 102.1 F (38.9 C) (tylenol given) BP: 120/69 MAP (mmHg): 82 Pulse Rate: 76 ECG Heart Rate: 77 Resp: (!) 25 Level of Consciousness: Alert SpO2: 90 % O2 Device: Room Air O2 Flow Rate (L/min): 2 L/min Assess: MEWS Score MEWS Temp: 2 MEWS Systolic: 0 MEWS Pulse: 0 MEWS RR: 1 MEWS LOC: 0 MEWS Score: 3 MEWS Score Color: Yellow Assess: SIRS CRITERIA SIRS Temperature : 1 SIRS Respirations : 1 SIRS Pulse: 0 SIRS WBC: 0 SIRS Score Sum : 2 SIRS Temperature : 1 SIRS Pulse: 0 SIRS Respirations : 1 SIRS WBC: 0 SIRS Score Sum : 2 Assess: if the MEWS score is Yellow or Red Were vital signs accurate and taken at a resting state?: Yes Does the patient meet 2 or more of the SIRS criteria?: No MEWS guidelines implemented : Yes, yellow Treat MEWS Interventions: Considered administering scheduled or prn medications/treatments as ordered Take Vital Signs Increase Vital Sign Frequency : Yellow: Q2hr x1, continue Q4hrs until patient remains green for 12hrs Escalate MEWS: Escalate: Yellow: Discuss with charge nurse and consider notifying provider and/or RRT Provider Notification Provider Name/Title: Dr. Lisabeth Register Date Provider Notified: 10/29/22 Time Provider Notified: [id number] Method of Notification: Call Notification Reason: Critical Result Provider response: See new orders Date of Provider Response: 10/29/22 Time of Provider Response: [id number]      Arvella Merles I 10/29/2022, 7:07 PM

## 2022-10-29 NOTE — Progress Notes (Signed)
ANTICOAGULATION CONSULT NOTE - Initial Consult  Pharmacy Consult for Heparin Indication: pulmonary embolus  Allergies  Allergen Reactions   Ranolazine Other (See Comments)    Chest discomfort/Reflux   Amoxicillin Hives   Atorvastatin Other (See Comments)    myalgias  Other reaction(s): Myalgias (intolerance)   Pravastatin Other (See Comments)    myalgias  Other reaction(s): Myalgias (intolerance)   Rosuvastatin Other (See Comments)    myalgias  Other reaction(s): Myalgias (intolerance)   Statins Other (See Comments)    myalgias    Patient Measurements:   Heparin Dosing Weight: 95 kg  Vital Signs: Temp: 97.9 F (36.6 C) (08/23 2345) Temp Source: Oral (08/23 2345) BP: 116/76 (08/23 2345) Pulse Rate: 64 (08/23 2345)  Labs: Recent Labs    10/28/22 2021 10/28/22 2227  HGB 18.8*  --   HCT 56.7*  --   PLT 228  --   LABPROT  --  14.4  INR  --  1.1  CREATININE 1.35*  --   TROPONINIHS 35* 33*    Estimated Creatinine Clearance: 63.7 mL/min (A) (by C-G formula based on SCr of 1.35 mg/dL (H)).   Medical History: Past Medical History:  Diagnosis Date   Anxiety    Arthritis    Coronary artery disease    a. S/p DES to RCA in 01/2019 b. s/p CABG x4 (LIMA-LAD, reverse SVG-PDA, reverse SVG-OM1, reverse SVG-1st Diag) in 01/2021   Diverticulosis    Dyspnea    Hard of hearing    Hyperlipidemia    Hypertension    hx of elevation on lyrica, off med and now normal    Paroxysmal SVT (supraventricular tachycardia)    Post-op atrial fibrillation    Prostate cancer (HCC)    Wears glasses     Medications:  No current facility-administered medications on file prior to encounter.   Current Outpatient Medications on File Prior to Encounter  Medication Sig Dispense Refill   acetaminophen (TYLENOL) 500 MG tablet Take 1,000 mg by mouth every 8 (eight) hours as needed for mild pain.     amLODipine (NORVASC) 5 MG tablet Take 1 tablet (5 mg) by mouth every morning & take 0.5  tablet (2.5 mg) by mouth at night. 135 tablet 1   aspirin EC 81 MG tablet Take 81 mg by mouth daily. Swallow whole.     clonazePAM (KLONOPIN) 1 MG tablet TAKE 1 TABLET BY MOUTH TWICE A DAY AS NEEDED FOR ANXIETY 60 tablet 3   clopidogrel (PLAVIX) 75 MG tablet TAKE 1 TABLET (75 MG TOTAL) BY MOUTH DAILY. STOP TAKING PLAVIX ON SATURDAY NOV. 5, 2022 90 tablet 3   cyclobenzaprine (FLEXERIL) 10 MG tablet Take 10 mg by mouth 3 (three) times daily as needed for muscle spasms.     Eszopiclone 3 MG TABS TAKE 1 TABLET BY MOUTH EVERYDAY AT BEDTIME (Patient taking differently: Take 3 mg by mouth daily as needed (sleep).) 30 tablet 2   Evolocumab (REPATHA SURECLICK) 140 MG/ML SOAJ INJECT 140 MG INTO THE SKIN EVERY 14 (FOURTEEN) DAYS. 6 mL 3   ezetimibe (ZETIA) 10 MG tablet TAKE 1 TABLET BY MOUTH EVERY DAY 90 tablet 3   Omega-3 Fatty Acids (OMEGA 3 PO) Take 500 mg by mouth every morning.       Assessment: 67 y.o. male with PE for heparin Goal of Therapy:  Heparin level 0.3-0.7 units/ml Monitor platelets by anticoagulation protocol: Yes   Plan:  Heparin 5000 units IV bolus, then start heparin 1700 units/hr Check heparin level in  6 hours.   Eddie Candle 10/29/2022,12:48 AM

## 2022-10-29 NOTE — Progress Notes (Signed)
Mobility Specialist Progress Note    10/29/22 1214  Mobility  Activity Ambulated with assistance in hallway  Level of Assistance Contact guard assist, steadying assist  Assistive Device Other (Comment) (HHA)  Distance Ambulated (ft) 250 ft  Activity Response Tolerated well  $Mobility charge 1 Mobility  Mobility Specialist Start Time (ACUTE ONLY) 1156  Mobility Specialist Stop Time (ACUTE ONLY) 1212  Mobility Specialist Time Calculation (min) (ACUTE ONLY) 16 min   Pre-Mobility: 80 HR, 133/79 (94) BP, 94% SpO2 During Mobility: 90 HR, 92% SpO2 Post-Mobility: 88 HR, 97% SpO2  Pt received in bed and agreeable. No complaints on walk. On RA. Returned to bed with call bell in reach.   [place] Mobility Specialist  Please Neurosurgeon or Rehab Office at [phone]

## 2022-10-29 NOTE — Progress Notes (Signed)
ANTICOAGULATION CONSULT NOTE  Pharmacy Consult for Heparin Indication: pulmonary embolus  Allergies  Allergen Reactions   Ranolazine Other (See Comments)    Chest discomfort/Reflux   Amoxicillin Hives   Atorvastatin Other (See Comments)    myalgias  Other reaction(s): Myalgias (intolerance)   Pravastatin Other (See Comments)    myalgias  Other reaction(s): Myalgias (intolerance)   Rosuvastatin Other (See Comments)    myalgias  Other reaction(s): Myalgias (intolerance)   Statins Other (See Comments)    myalgias    Patient Measurements: Height: 5\' 11"  (180.3 cm) Weight: 101.6 kg (223 lb 15.8 oz) IBW/kg (Calculated) : 75.3 Heparin Dosing Weight: 95 kg  Vital Signs: Temp: 98.7 F (37.1 C) (08/24 0822) Temp Source: Oral (08/24 0822) BP: 110/71 (08/24 0822) Pulse Rate: 76 (08/24 0300)  Labs: Recent Labs    10/28/22 2021 10/28/22 2227 10/29/22 0011 10/29/22 0336 10/29/22 0707  HGB 18.8*  --   --  18.1*  --   HCT 56.7*  --   --  54.8*  --   PLT 228  --   --  176  --   LABPROT  --  14.4  --   --   --   INR  --  1.1  --   --   --   HEPARINUNFRC  --   --   --   --  0.37  CREATININE 1.35*  --   --  1.51*  --   CKTOTAL  --   --  363  --   --   TROPONINIHS 35* 33*  --   --   --     Estimated Creatinine Clearance: 56.8 mL/min (A) (by C-G formula based on SCr of 1.51 mg/dL (H)).  Assessment: 67 y.o. male with PE continues on heparin  Initial heparin level 0.37  Goal of Therapy:  Heparin level 0.3-0.7 units/ml Monitor platelets by anticoagulation protocol: Yes   Plan:  Increase heparin to 1800 units / hr Heparin level at 4 pm   Thank you Okey Regal, PharmD  10/29/2022,10:20 AM

## 2022-10-29 NOTE — ED Notes (Signed)
Pt sating in the high 80s. Pt placed on 2 L currently 95 N/C.

## 2022-10-29 NOTE — Progress Notes (Signed)
Bilateral lower extremity venous duplex has been completed. Preliminary results can be found in CV Proc through chart review.   10/29/22 9:45 AM Olen Cordial RVT

## 2022-10-29 NOTE — ED Notes (Signed)
ED TO INPATIENT HANDOFF REPORT  ED Nurse Name and Phone #: Pearletha Forge RN   S Name/Age/Gender Jacob Collier 66 y.o. male Room/Bed: RESUSC/RESUSC  Code Status   Code Status: Full Code  Home/SNF/Other Home Patient oriented to: self, place, time, and situation Is this baseline? Yes   Triage Complete: Triage complete  Chief Complaint Acute pulmonary embolism [facility]) [I26.99]  Triage Note Patient reports worsening SOB with generalized weakness/fatigue onset last week , his cardiologist is Dr. Tresa Endo .    Allergies Allergies  Allergen Reactions   Ranolazine Other (See Comments)    Chest discomfort/Reflux   Amoxicillin Hives   Atorvastatin Other (See Comments)    myalgias  Other reaction(s): Myalgias (intolerance)   Pravastatin Other (See Comments)    myalgias  Other reaction(s): Myalgias (intolerance)   Rosuvastatin Other (See Comments)    myalgias  Other reaction(s): Myalgias (intolerance)   Statins Other (See Comments)    myalgias    Level of Care/Admitting Diagnosis ED Disposition     ED Disposition  Admit   Condition  --   Comment  Hospital Area: [facility] [[id number]]  Level of Care: Telemetry Medical [104]  May place patient in observation at [facility] or [facility] if equivalent level of care is available:: No  Covid Evaluation: Asymptomatic - no recent exposure (last 10 days) testing not required  Diagnosis: Acute pulmonary embolism [facility]) [[id number]]  Admitting Physician: Angie Fava [[id number]]  Attending Physician: Angie Fava [[id number]]          B Medical/Surgery History Past Medical History:  Diagnosis Date   Anxiety    Arthritis    Coronary artery disease    a. S/p DES to RCA in 01/2019 b. s/p CABG x4 (LIMA-LAD, reverse SVG-PDA, reverse SVG-OM1, reverse SVG-1st Diag) in 01/2021   Diverticulosis    Dyspnea    Hard of hearing    Hyperlipidemia    Hypertension    hx of elevation on lyrica, off med and now normal     Paroxysmal SVT (supraventricular tachycardia)    Post-op atrial fibrillation    Prostate cancer (HCC)    Wears glasses    Past Surgical History:  Procedure Laterality Date   ANTERIOR CERVICAL DECOMP/DISCECTOMY FUSION N/A 09/03/2021   Procedure: CERVICAL FIVE-SIX, CERVICAL SIX-SEVEN ANTERIOR CERVICAL DECOMPRESSION/DISCECTOMY FUSION;  Surgeon: Julio Sicks, MD;  Location: [facility] OR;  Service: Neurosurgery;  Laterality: N/A;   CARDIAC CATHETERIZATION     with 2 stents    COLECTOMY  05/2018   COLONOSCOPY     CORONARY ARTERY BYPASS GRAFT N/A 01/19/2021   Procedure: CORONARY ARTERY BYPASS GRAFTING (CABG), ON PUMP TIMES FOUR, USING LEFT INTERNAL MAMMARY ARTERY AND ENDOSOCPICALLY HARVESTED RIGHT GREATER SAPHENOUS VEIN;  Surgeon: Corliss Skains, MD;  Location: [facility] OR;  Service: Open Heart Surgery;  Laterality: N/A;   CYSTOSCOPY WITH URETHRAL DILATATION N/A 01/14/2021   Procedure: CYSTOSCOPY WITH BALLOON  DILATATION;  Surgeon: Alfredo Martinez, MD;  Location: [facility] ORS;  Service: Urology;  Laterality: N/A;   EYE SURGERY  02/2020   bilaterl cataracts   IR THORACENTESIS ASP PLEURAL SPACE W/IMG GUIDE  02/02/2021   LEFT HEART CATH AND CORONARY ANGIOGRAPHY N/A 12/24/2020   Procedure: LEFT HEART CATH AND CORONARY ANGIOGRAPHY;  Surgeon: Swaziland, Peter M, MD;  Location: [facility] INVASIVE CV LAB;  Service: Cardiovascular;  Laterality: N/A;   PROSTATECTOMY     SVT ABLATION N/A 10/20/2022   Procedure: SVT ABLATION;  Surgeon: Maurice Small, MD;  Location:  [facility] INVASIVE CV LAB;  Service: Cardiovascular;  Laterality: N/A;   TEE WITHOUT CARDIOVERSION N/A 01/19/2021   Procedure: TRANSESOPHAGEAL ECHOCARDIOGRAM (TEE);  Surgeon: Corliss Skains, MD;  Location[facility] OR;  Service: Open Heart Surgery;  Laterality: N/A;   TOTAL HIP ARTHROPLASTY Left 11/10/2015   Procedure: LEFT TOTAL HIP ARTHROPLASTY ANTERIOR APPROACH;  Surgeon: Kathryne Hitch, MD;  Location: [facility] OR;  Service: Orthopedics;  Laterality: Left;      A IV Location/Drains/Wounds Patient Lines/Drains/Airways Status     Active Line/Drains/Airways     Name Placement date Placement time Site Days   Peripheral IV 10/29/22 18 G 1.16" Left Forearm 10/29/22  0020  Forearm  less than 1            Intake/Output Last 24 hours  Intake/Output Summary (Last 24 hours) at 10/29/2022 0201 Last data filed at 10/29/2022 0200 Gross per 24 hour  Intake 501.68 ml  Output --  Net 501.68 ml    Labs/Imaging Results for orders placed or performed during the hospital encounter of 10/28/22 (from the past 48 hour(s))  Basic metabolic panel     Status: Abnormal   Collection Time: 10/28/22  8:21 PM  Result Value Ref Range   Sodium 134 (L) 135 - 145 mmol/L   Potassium 4.3 3.5 - 5.1 mmol/L   Chloride 101 98 - 111 mmol/L   CO2 18 (L) 22 - 32 mmol/L   Glucose, Bld 102 (H) 70 - 99 mg/dL    Comment: Glucose reference range applies only to samples taken after fasting for at least 8 hours.   BUN 11 8 - 23 mg/dL   Creatinine, Ser 1.61 (H) 0.61 - 1.24 mg/dL   Calcium 8.8 (L) 8.9 - 10.3 mg/dL   GFR, Estimated 57 (L) >60 mL/min    Comment: (NOTE) Calculated using the CKD-EPI Creatinine Equation (2021)    Anion gap 15 5 - 15    Comment: Performed at [facility], [address], [place], [place] [address]  CBC     Status: Abnormal   Collection Time: 10/28/22  8:21 PM  Result Value Ref Range   WBC 5.3 4.0 - 10.5 K/uL   RBC 6.60 (H) 4.22 - 5.81 MIL/uL   Hemoglobin 18.8 (H) 13.0 - 17.0 g/dL   HCT 54.0 (H) 98.1 - 19.1 %    Comment: REPEATED TO VERIFY   MCV 85.9 80.0 - 100.0 fL   MCH 28.5 26.0 - 34.0 pg   MCHC 33.2 30.0 - 36.0 g/dL   RDW 47.8 29.5 - 62.1 %   Platelets 228 150 - 400 K/uL   nRBC 0.0 0.0 - 0.2 %    Comment: Performed at [facility], [address], [place], [place] [address]  Troponin I (High Sensitivity)     Status: Abnormal   Collection Time: 10/28/22  8:21 PM  Result Value Ref Range   Troponin I (High Sensitivity) 35  (H) <18 ng/L    Comment: (NOTE) Elevated high sensitivity troponin I (hsTnI) values and significant  changes across serial measurements may suggest ACS but many other  chronic and acute conditions are known to elevate hsTnI results.  Refer to the "Links" section for chest pain algorithms and additional  guidance. Performed at [facility], [address] [place], [place] [address]   Brain natriuretic peptide     Status: None   Collection Time: 10/28/22  8:21 PM  Result Value Ref Range   B Natriuretic Peptide  40.3 0.0 - 100.0 pg/mL    Comment: Performed at [facility], [address] [place], [place] [address]  Protime-INR     Status: None   Collection Time: 10/28/22 10:27 PM  Result Value Ref Range   Prothrombin Time 14.4 11.4 - 15.2 seconds    Comment: SPECIMEN COLLECTED IN ANTICOAGULANT ADJUSTED TUBE DUE TO ELEVATED HEMATOCRIT   INR 1.1 0.8 - 1.2    Comment: SPECIMEN COLLECTED IN ANTICOAGULANT ADJUSTED TUBE DUE TO ELEVATED HEMATOCRIT (NOTE) INR goal varies based on device and disease states. Performed at [facility], [address] [place], [place] [address]   Troponin I (High Sensitivity)     Status: Abnormal   Collection Time: 10/28/22 10:27 PM  Result Value Ref Range   Troponin I (High Sensitivity) 33 (H) <18 ng/L    Comment: (NOTE) Elevated high sensitivity troponin I (hsTnI) values and significant  changes across serial measurements may suggest ACS but many other  chronic and acute conditions are known to elevate hsTnI results.  Refer to the "Links" section for chest pain algorithms and additional  guidance. Performed at [facility], [address], [place], [place] [address]   CK     Status: None   Collection Time: 10/29/22 12:11 AM  Result Value Ref Range   Total CK 363 49 - 397 U/L    Comment: Performed at [facility], [address] [place], [place] [address]  Hepatic function panel     Status: Abnormal   Collection Time:  10/29/22 12:11 AM  Result Value Ref Range   Total Protein 6.2 (L) 6.5 - 8.1 g/dL   Albumin 3.1 (L) 3.5 - 5.0 g/dL   AST 29 15 - 41 U/L   ALT 23 0 - 44 U/L   Alkaline Phosphatase 63 38 - 126 U/L   Total Bilirubin 0.9 0.3 - 1.2 mg/dL   Bilirubin, Direct 0.2 0.0 - 0.2 mg/dL   Indirect Bilirubin 0.7 0.3 - 0.9 mg/dL    Comment: Performed at [facility], [address] [place], [place] [address]   CT Angio Chest Pulmonary Embolism (PE) W or WO Contrast  Result Date: 10/29/2022 CLINICAL DATA:  Shortness of breath. Concern for pulmonary embolism. EXAM: CT ANGIOGRAPHY CHEST WITH CONTRAST TECHNIQUE: Multidetector CT imaging of the chest was performed using the standard protocol during bolus administration of intravenous contrast. Multiplanar CT image reconstructions and MIPs were obtained to evaluate the vascular anatomy. RADIATION DOSE REDUCTION: This exam was performed according to the departmental dose-optimization program which includes automated exposure control, adjustment of the mA and/or kV according to patient size and/or use of iterative reconstruction technique. CONTRAST:  75mL OMNIPAQUE IOHEXOL 350 MG/ML SOLN COMPARISON:  Chest CT dated 01/06/2022. FINDINGS: Cardiovascular: Mild cardiomegaly. No pericardial effusion. There is coronary vascular calcification and postsurgical changes of CABG. Mild atherosclerotic calcification of the thoracic aorta. No aneurysmal dilatation. Right middle lobe segmental pulmonary artery embolism. No CT evidence of right heart straining. Mediastinum/Nodes: No hilar adenopathy. The esophagus is grossly unremarkable. No mediastinal fluid collection. Lungs/Pleura: Trace bilateral pleural effusions and bibasilar subpleural atelectasis. No pneumothorax. The central airways are patent. Upper Abdomen: No acute abnormality. Musculoskeletal: Degenerative changes of the spine. Median sternotomy wires. No acute osseous pathology. Review of the MIP images confirms the above  findings. IMPRESSION: 1. Right middle lobe segmental pulmonary artery embolism. No CT evidence of right heart straining. 2. Trace bilateral pleural effusions and bibasilar subpleural atelectasis. 3.  Aortic Atherosclerosis (ICD10-I70.0). These results were called by telephone at the time of interpretation on 10/29/2022 at 12:44 am to provider [facility] , who verbally acknowledged these results. Electronically Signed   By: Elgie Collard M.D.   On: 10/29/2022 00:46   DG Chest 2 View  Result Date: 10/28/2022 CLINICAL DATA:  Shortness of breath. EXAM: CHEST - 2 VIEW COMPARISON:  PA Lat 05/14/2022 FINDINGS: The cardiac size is normal. There are old CABG changes. Slight aortic tortuosity with mild atherosclerosis and stable mediastinum. The lungs are clear of infiltrates. A calcified granuloma is again noted in the left lower lung field. Thoracic spondylosis and mild dextroscoliosis. Three level lower cervical ACDF plating is again shown. IMPRESSION: No active cardiopulmonary disease. Old granulomatous disease. Post CABG chest. Electronically Signed   By: Almira Bar M.D.   On: 10/28/2022 21:12    Pending Labs Unresulted Labs (From admission, onward)     Start     Ordered   10/30/22 0500  Heparin level (unfractionated)  Daily,   R      10/29/22 0050   10/30/22 0500  CBC  Daily,   R      10/29/22 0050   10/29/22 0800  Heparin level (unfractionated)  Once-Timed,   URGENT        10/29/22 0050   10/29/22 0500  CBC with Differential/Platelet  Tomorrow morning,   R        10/29/22 0143   10/29/22 0500  Comprehensive metabolic panel  Tomorrow morning,   R        10/29/22 0143   10/29/22 0500  Magnesium  Tomorrow morning,   R        10/29/22 0143   10/29/22 0500  Phosphorus  Tomorrow morning,   R        10/29/22 0143   10/28/22 2308  Culture, blood (Routine X 2) w Reflex to ID Panel  BLOOD CULTURE X 2,   R (with STAT occurrences)      10/28/22 2308   10/28/22 2305  SARS Coronavirus 2 by RT PCR  (hospital order, performed in [facility] hospital lab) *cepheid single result test* Anterior Nasal Swab  (Tier 2 - SARS Coronavirus 2 by RT PCR (hospital order, performed in [facility] hospital lab) *cepheid single result test*)  Once,   URGENT        10/28/22 2304   10/28/22 2305  Urinalysis, Routine w reflex microscopic -Urine, Clean Catch  Once,   URGENT       Question:  Specimen Source  Answer:  Urine, Clean Catch   10/28/22 2304            Vitals/Pain Today's Vitals   10/28/22 2011 10/28/22 2012 10/28/22 2345 10/29/22 0117  BP: 119/75  116/76   Pulse: 80  64   Resp: (!) 22  20   Temp: 99 F (37.2 C)  97.9 F (36.6 C)   TempSrc: Oral  Oral   SpO2: 95%  95%   Weight:    100.7 kg  Height:    5\' 11"  (1.803 m)  PainSc:  0-No pain      Isolation Precautions Airborne and Contact precautions  Medications Medications  heparin ADULT infusion 100 units/mL (25000 units/270mL) (1,700 Units/hr Intravenous New Bag/Given 10/29/22 0113)  acetaminophen (TYLENOL) tablet 650 mg (has no administration in time range)    Or  acetaminophen (TYLENOL) suppository 650 mg (has no administration in time range)  melatonin tablet 3 mg (has no  administration in time range)  ondansetron (ZOFRAN) injection 4 mg (has no administration in time range)  sodium chloride 0.9 % bolus 500 mL (0 mLs Intravenous Stopped 10/29/22 0200)  iohexol (OMNIPAQUE) 350 MG/ML injection 75 mL (75 mLs Intravenous Contrast Given 10/29/22 0033)  heparin bolus via infusion 5,000 Units (5,000 Units Intravenous Bolus from Bag 10/29/22 0113)    Mobility walks     Focused Assessments Pulmonary Assessment Handoff:  Lung sounds:   2L [place]      R Recommendations: See Admitting Provider Note  Report given to:   Additional Notes:

## 2022-10-29 NOTE — H&P (Signed)
History and Physical      Jacob Collier EAV:[id number] DOB: 1955-03-10 DOA: 10/28/2022; DOS: 10/29/2022  PCP: Blane Ohara, MD  Patient coming from: home   I have personally briefly reviewed patient's old medical records in [facility]  Chief Complaint: sob  HPI: Jacob Collier is a 67 y.o. male with medical history significant for essential hypertension, hyperlipidemia, paroxysmal SVT status post recent ablation, who is admitted to [facility] on 10/28/2022 with acute pulmonary embolism after presenting from home to [facility] ED complaining of shortness of breath.   In the setting of a history of paroxysmal SVT, the patient underwent ablation procedure at [facility] during hospitalization from 10/20/2022 to 10/21/2022.  Subsequently, over the last 1 to 2 days, he has developed progressive shortness of breath associated with subjective fever, nonproductive cough.  Denies any associated chest pain, palpitations, diaphoresis, dizziness, presyncope, or syncope.  He also conveys that his shortness of breath not been associate any orthopnea, PND, or worsening of peripheral edema.  No hemoptysis.  Denies any known prior history of DVT or PE.  Is on a daily baby aspirin as an outpatient.  Otherwise, on no blood thinners at home.  Conveys no known baseline supplemental oxygen requirements.  No recent headache, neck stiffness, rash, abdominal pain, diarrhea, dysuria, or gross hematuria.     ED Course:  Vital signs in the ED were notable for the following: Temperature max nine 9.0; heart rates in the 60s to 80s; systolic blood pressures in the 1 teens to 120s; respiratory rate 20-26, initial oxygen saturation 88% room air, Sosan improving into the range of 95 to 96% on 2 L nasal cannula.  Labs were notable for the following: CMP notable for the following: Potassium 4.3, creatinine 1.35 compared to most recent prior serum creatinine did point of 1.14 on 10/04/2022, calcium, adjusted for mild  hypoalbuminemia noted to be 9.5, albumin 3.1, otherwise, liver enzymes within normal limits.  BNP 40.  High-sensitivity troponin I initially noted to be 35, with repeat value trending down to 33, relative to most recent prior high sensitive troponin I value of 13 in October 2022.  CBC notable for white cell count 5300.  INR 1.1.  Urinalysis ordered, Therazole currently pending.  COVID-19 PCR is pending.  Per my interpretation, EKG in ED demonstrated the following: Sinus rhythm with heart rate 79, normal intervals, nonspecific T wave inversion limited to V6, no evidence of ST changes, including no evidence of ST elevation.  Imaging in the ED, per corresponding formal radiology read, was notable for the following: CTA chest PE study showed acute segmental pulmonary embolism involving the right middle lobe, without CT evidence of right heart strain, will also showing trace bilateral pleural effusions, bibasilar atelectasis, but no evidence of infiltrate or pneumothorax.  While in the ED, the following were administered: Heparin 5000 units IV x 1 dose followed by initiation of heparin drip.  Normal saline has 500 cc bolus.  Subsequently, the patient was admitted for further evaluation management of acute pulmonary embolism complicated by acute hypoxic respiratory distress.     Review of Systems: As per HPI otherwise 10 point review of systems negative.   Past Medical History:  Diagnosis Date   Anxiety    Arthritis    Coronary artery disease    a. S/p DES to RCA in 01/2019 b. s/p CABG x4 (LIMA-LAD, reverse SVG-PDA, reverse SVG-OM1, reverse SVG-1st Diag) in 01/2021   Diverticulosis    Dyspnea  Hard of hearing    Hyperlipidemia    Hypertension    hx of elevation on lyrica, off med and now normal    Paroxysmal SVT (supraventricular tachycardia)    Post-op atrial fibrillation    Prostate cancer (HCC)    Wears glasses     Past Surgical History:  Procedure Laterality Date   ANTERIOR  CERVICAL DECOMP/DISCECTOMY FUSION N/A 09/03/2021   Procedure: CERVICAL FIVE-SIX, CERVICAL SIX-SEVEN ANTERIOR CERVICAL DECOMPRESSION/DISCECTOMY FUSION;  Surgeon: Julio Sicks, MD;  Location: [facility] OR;  Service: Neurosurgery;  Laterality: N/A;   CARDIAC CATHETERIZATION     with 2 stents    COLECTOMY  05/2018   COLONOSCOPY     CORONARY ARTERY BYPASS GRAFT N/A 01/19/2021   Procedure: CORONARY ARTERY BYPASS GRAFTING (CABG), ON PUMP TIMES FOUR, USING LEFT INTERNAL MAMMARY ARTERY AND ENDOSOCPICALLY HARVESTED RIGHT GREATER SAPHENOUS VEIN;  Surgeon: Corliss Skains, MD;  Location: [facility] OR;  Service: Open Heart Surgery;  Laterality: N/A;   CYSTOSCOPY WITH URETHRAL DILATATION N/A 01/14/2021   Procedure: CYSTOSCOPY WITH BALLOON  DILATATION;  Surgeon: Alfredo Martinez, MD;  Location: [facility] ORS;  Service: Urology;  Laterality: N/A;   EYE SURGERY  02/2020   bilaterl cataracts   IR THORACENTESIS ASP PLEURAL SPACE W/IMG GUIDE  02/02/2021   LEFT HEART CATH AND CORONARY ANGIOGRAPHY N/A 12/24/2020   Procedure: LEFT HEART CATH AND CORONARY ANGIOGRAPHY;  Surgeon: Swaziland, Peter M, MD;  Location: [facility] INVASIVE CV LAB;  Service: Cardiovascular;  Laterality: N/A;   PROSTATECTOMY     SVT ABLATION N/A 10/20/2022   Procedure: SVT ABLATION;  Surgeon: Maurice Small, MD;  Location: [facility] INVASIVE CV LAB;  Service: Cardiovascular;  Laterality: N/A;   TEE WITHOUT CARDIOVERSION N/A 01/19/2021   Procedure: TRANSESOPHAGEAL ECHOCARDIOGRAM (TEE);  Surgeon: Corliss Skains, MD;  Location: [facility] OR;  Service: Open Heart Surgery;  Laterality: N/A;   TOTAL HIP ARTHROPLASTY Left 11/10/2015   Procedure: LEFT TOTAL HIP ARTHROPLASTY ANTERIOR APPROACH;  Surgeon: Kathryne Hitch, MD;  Location: [facility] OR;  Service: Orthopedics;  Laterality: Left;    Social History:  reports that he has never smoked. He has never used smokeless tobacco. He reports that he does not currently use alcohol. He reports that he does not use drugs.   Allergies   Allergen Reactions   Ranolazine Other (See Comments)    Chest discomfort/Reflux   Amoxicillin Hives   Atorvastatin Other (See Comments)    myalgias  Other reaction(s): Myalgias (intolerance)   Pravastatin Other (See Comments)    myalgias  Other reaction(s): Myalgias (intolerance)   Rosuvastatin Other (See Comments)    myalgias  Other reaction(s): Myalgias (intolerance)   Statins Other (See Comments)    myalgias    Family History  Problem Relation Age of Onset   Alzheimer's disease Mother    Stroke Father    Diabetes Sister    Heart attack Maternal Grandmother    Cancer Maternal Uncle        prostate   Prostate cancer Maternal Uncle    Cancer Maternal Uncle        prostate   Prostate cancer Maternal Uncle    Colon polyps Brother    Colon cancer Neg Hx    Esophageal cancer Neg Hx    Rectal cancer Neg Hx    Stomach cancer Neg Hx     Family history reviewed and not pertinent    Prior to Admission medications   Medication Sig Start Date End Date Taking? Authorizing Provider  acetaminophen (TYLENOL) 500 MG tablet Take 1,000 mg by mouth every 8 (eight) hours as needed for mild pain.    [provider]  amLODipine (NORVASC) 5 MG tablet Take 1 tablet (5 mg) by mouth every morning & take 0.5 tablet (2.5 mg) by mouth at night. 10/26/22   Lennette Bihari, MD  aspirin EC 81 MG tablet Take 81 mg by mouth daily. Swallow whole.    [provider]  clonazePAM (KLONOPIN) 1 MG tablet TAKE 1 TABLET BY MOUTH TWICE A DAY AS NEEDED FOR ANXIETY 10/25/22   Sirivol, Kristie Cowman, MD  clopidogrel (PLAVIX) 75 MG tablet TAKE 1 TABLET (75 MG TOTAL) BY MOUTH DAILY. STOP TAKING PLAVIX ON SATURDAY NOV. 5, 2022 11/03/21   Lennette Bihari, MD  cyclobenzaprine (FLEXERIL) 10 MG tablet Take 10 mg by mouth 3 (three) times daily as needed for muscle spasms. 10/13/22   [provider]  Eszopiclone 3 MG TABS TAKE 1 TABLET BY MOUTH EVERYDAY AT BEDTIME Patient taking differently: Take 3 mg  by mouth daily as needed (sleep). 05/31/22   Cox, Kirsten, MD  Evolocumab (REPATHA SURECLICK) 140 MG/ML SOAJ INJECT 140 MG INTO THE SKIN EVERY 14 (FOURTEEN) DAYS. 01/31/22   Lennette Bihari, MD  ezetimibe (ZETIA) 10 MG tablet TAKE 1 TABLET BY MOUTH EVERY DAY 05/10/22   Lennette Bihari, MD  Omega-3 Fatty Acids (OMEGA 3 PO) Take 500 mg by mouth every morning.    [provider]     Objective    Physical Exam: Vitals:   10/28/22 2011 10/28/22 2345 10/29/22 0117  BP: 119/75 116/76   Pulse: 80 64   Resp: (!) 22 20   Temp: 99 F (37.2 C) 97.9 F (36.6 C)   TempSrc: Oral Oral   SpO2: 95% 95%   Weight:   100.7 kg  Height:   5\' 11"  (1.803 m)    General: appears to be stated age; alert, oriented Skin: warm, dry, no rash Head:  AT/[place] Mouth:  Oral mucosa membranes appear moist, normal dentition Neck: supple; trachea midline Heart:  RRR; did not appreciate any M/R/G Lungs: CTAB, did not appreciate any wheezes, rales, or rhonchi Abdomen: + BS; soft, ND, NT Vascular: 2+ pedal pulses b/l; 2+ radial pulses b/l Extremities: no peripheral edema, no muscle wasting Neuro: strength and sensation intact in upper and lower extremities b/l     Labs on Admission: I have personally reviewed following labs and imaging studies  CBC: Recent Labs  Lab 10/28/22 2021  WBC 5.3  HGB 18.8*  HCT 56.7*  MCV 85.9  PLT 228   Basic Metabolic Panel: Recent Labs  Lab 10/28/22 2021  NA 134*  K 4.3  CL 101  CO2 18*  GLUCOSE 102*  BUN 11  CREATININE 1.35*  CALCIUM 8.8*   GFR: Estimated Creatinine Clearance: 63.3 mL/min (A) (by C-G formula based on SCr of 1.35 mg/dL (H)). Liver Function Tests: Recent Labs  Lab 10/29/22 0011  AST 29  ALT 23  ALKPHOS 63  BILITOT 0.9  PROT 6.2*  ALBUMIN 3.1*   No results for input(s): "LIPASE", "AMYLASE" in the last 168 hours. No results for input(s): "AMMONIA" in the last 168 hours. Coagulation Profile: Recent Labs  Lab 10/28/22 2227  INR 1.1    Cardiac Enzymes: Recent Labs  Lab 10/29/22 0011  CKTOTAL 363   BNP (last 3 results) No results for input(s): "PROBNP" in the last 8760 hours. HbA1C: No results for input(s): "HGBA1C" in the last 72  hours. CBG: No results for input(s): "GLUCAP" in the last 168 hours. Lipid Profile: No results for input(s): "CHOL", "HDL", "LDLCALC", "TRIG", "CHOLHDL", "LDLDIRECT" in the last 72 hours. Thyroid Function Tests: No results for input(s): "TSH", "T4TOTAL", "FREET4", "T3FREE", "THYROIDAB" in the last 72 hours. Anemia Panel: No results for input(s): "VITAMINB12", "FOLATE", "FERRITIN", "TIBC", "IRON", "RETICCTPCT" in the last 72 hours. Urine analysis:    Component Value Date/Time   COLORURINE YELLOW 01/15/2021 1520   APPEARANCEUR HAZY (A) 01/15/2021 1520   LABSPEC 1.027 01/15/2021 1520   PHURINE 5.0 01/15/2021 1520   GLUCOSEU NEGATIVE 01/15/2021 1520   HGBUR LARGE (A) 01/15/2021 1520   BILIRUBINUR negative 08/04/2022 1658   KETONESUR negative 08/04/2022 1658   KETONESUR NEGATIVE 01/15/2021 1520   PROTEINUR 30 (A) 01/15/2021 1520   UROBILINOGEN 0.2 08/04/2022 1658   UROBILINOGEN 0.2 09/24/2014 1017   NITRITE Negative 08/04/2022 1658   NITRITE NEGATIVE 01/15/2021 1520   LEUKOCYTESUR Negative 08/04/2022 1658   LEUKOCYTESUR NEGATIVE 01/15/2021 1520    Radiological Exams on Admission: CT Angio Chest Pulmonary Embolism (PE) W or WO Contrast  Result Date: 10/29/2022 CLINICAL DATA:  Shortness of breath. Concern for pulmonary embolism. EXAM: CT ANGIOGRAPHY CHEST WITH CONTRAST TECHNIQUE: Multidetector CT imaging of the chest was performed using the standard protocol during bolus administration of intravenous contrast. Multiplanar CT image reconstructions and MIPs were obtained to evaluate the vascular anatomy. RADIATION DOSE REDUCTION: This exam was performed according to the departmental dose-optimization program which includes automated exposure control, adjustment of the mA and/or kV  according to patient size and/or use of iterative reconstruction technique. CONTRAST:  75mL OMNIPAQUE IOHEXOL 350 MG/ML SOLN COMPARISON:  Chest CT dated 01/06/2022. FINDINGS: Cardiovascular: Mild cardiomegaly. No pericardial effusion. There is coronary vascular calcification and postsurgical changes of CABG. Mild atherosclerotic calcification of the thoracic aorta. No aneurysmal dilatation. Right middle lobe segmental pulmonary artery embolism. No CT evidence of right heart straining. Mediastinum/Nodes: No hilar adenopathy. The esophagus is grossly unremarkable. No mediastinal fluid collection. Lungs/Pleura: Trace bilateral pleural effusions and bibasilar subpleural atelectasis. No pneumothorax. The central airways are patent. Upper Abdomen: No acute abnormality. Musculoskeletal: Degenerative changes of the spine. Median sternotomy wires. No acute osseous pathology. Review of the MIP images confirms the above findings. IMPRESSION: 1. Right middle lobe segmental pulmonary artery embolism. No CT evidence of right heart straining. 2. Trace bilateral pleural effusions and bibasilar subpleural atelectasis. 3.  Aortic Atherosclerosis (ICD10-I70.0). These results were called by telephone at the time of interpretation on 10/29/2022 at 12:44 am to provider [facility] , who verbally acknowledged these results. Electronically Signed   By: Elgie Collard M.D.   On: 10/29/2022 00:46   DG Chest 2 View  Result Date: 10/28/2022 CLINICAL DATA:  Shortness of breath. EXAM: CHEST - 2 VIEW COMPARISON:  PA Lat 05/14/2022 FINDINGS: The cardiac size is normal. There are old CABG changes. Slight aortic tortuosity with mild atherosclerosis and stable mediastinum. The lungs are clear of infiltrates. A calcified granuloma is again noted in the left lower lung field. Thoracic spondylosis and mild dextroscoliosis. Three level lower cervical ACDF plating is again shown. IMPRESSION: No active cardiopulmonary disease. Old granulomatous  disease. Post CABG chest. Electronically Signed   By: Almira Bar M.D.   On: 10/28/2022 21:12      Assessment/Plan   Principal Problem:   Acute pulmonary embolism (HCC) Active Problems:   Essential hypertension   Hyperlipidemia   Acute hypoxic respiratory failure (HCC)   Elevated troponin   GAD (generalized anxiety  disorder)     #) Acute Pulmonary Embolism: In the context of presenting 1 to 2 days of progressive shortness of breath associate with subjective fever, new cough, and new supplemental oxygen requirement, with CTA chest demonstarting evidence of an acute PE involving the right middle lobe. No clinical or radiographic evidence of associated cor pulmonale. No evidence of associated hypotension to warrant consideration for TPA administration. Appears to be patient's first PE/DVT.  Appears provoked in the setting of recent ablation procedure for the patient's history of paroxysmal SVT.  No utility in evaluating for inheritable hypercoagulability at this time as the inflammatory phase associated with an acute PE contributes to misleading data associated with this workup. Overall, will require at least 3-6 months of anticoagulation.  Aside from daily baby aspirin, not on any blood-thinners at home.   He was started on heparin drip in the ED this evening.  mildly elevated troponin appears consistent with finding of acute PE, and ACS is felt to be less likely at this time, particular in the absence of any associated chest pain, while EKG demonstrates no evidence of acute ischemic changes.  Additional factors that may be contributing to the patient's initially mildly elevated troponin include generalized relative decline in perfusion as consequence of acute hypoxic respiratory distress as well as potential contribution from recent cardiac ablation procedure about 8 days ago.    Plan: Heparin drip overnight.  Monitor on telemetry. Monitor for development of hypotension. Prn supplemental  O2 in order to maintain oxygen saturations greater than or equal to 92%.  Prn IV fentanyl. Incentive spirometry.  Recheck CBC in the morning.  Echocardiogram ordered for the morning.                 #) Acute hypoxic respiratory distress: in the context of acute respiratory symptoms and no known baseline supplemental O2 requirements, presenting O2 sat in the high 80s, Sosan improving into the mid 90s on 2 L nasal cannula, thereby meeting criteria for acute hypoxic respiratory distress as opposed to acute hypoxic respiratory failure at this time. Appears to be on basis of presenting acute pulmonary embolism identified on CTA chest, which showed no evidence of infiltrate to suggest pneumonia nor any evidence of pneumothorax.  Furthermore, no clinical or radiographic evidence to suggest acutely decompensated heart failure at this time.  Suspect that mildly elevated troponin is on the basis of supply demand mismatch resulting from presenting acute pulmonary embolism as well as the decrease in oxygen delivery capacity that is a/w presenting acute hypoxic respiratory distress as opposed to representing a type I process due to acute plaque rupture, particularly given presenting ekg that shows no e/o acute ischemic changes, including no evidence of STEMI, and the absence of any reported chest pain a/w this presentation.   Overall, ACS is felt to be less likely relative to type 2 supply demand mismatch, as above, but will closely monitor on telemetry overnight while treating acute pulmonary embolism, as above.  Of note, COVID-19 PCR result is currently pending.   Plan: further evaluation/management of presenting acute pulmonary embolism, as above, including continuation of heparin drip. Monitor continuous pulse ox with prn supplemental O2 to maintain O2 sats greater than or equal to 92%. monitor on telemetry. CMP/CBC in the AM. Check serum Mg and Phos levels. incentive spirometry.  Follow for result  of COVID-19 PCR.                   #) Generalized anxiety disorder: documented h/o  such. On as needed clonazepam as outpatient, in the absence of any scheduled SSRI or SNRI.    Plan: Continue outpatient as needed clonazepam.                  #) Essential Hypertension: documented h/o such, with outpatient antihypertensive regimen including amlodipine.  SBP's in the ED today: 1 teens to 120s mmHg. in the setting of presenting acute pulmonary embolism, with associated increased risk for hypotension, will hold home amlodipine for now.  Plan: Close monitoring of subsequent BP via routine VS. hold home amlodipine for now, as above.                   #) Hyperlipidemia: documented h/o such. On Zetia as outpatient.   Plan: continue home Zetia.       DVT prophylaxis: Heparin drip, as above Code Status: Full code Family Communication: none Disposition Plan: Per Rounding Team Consults called: none;  Admission status: Observation     I SPENT GREATER THAN 75  MINUTES IN CLINICAL CARE TIME/MEDICAL DECISION-MAKING IN COMPLETING THIS ADMISSION.      Chaney Born Ladeana Laplant DO [facility]  From 7PM - 7AM   10/29/2022, 1:46 AM

## 2022-10-29 NOTE — Progress Notes (Signed)
Echocardiogram 2D Echocardiogram has been performed.  Warren Lacy Alvia Tory RDCS 10/29/2022, 10:50 AM

## 2022-10-29 NOTE — Progress Notes (Signed)
ANTICOAGULATION CONSULT NOTE   Pharmacy Consult for Heparin Indication: pulmonary embolus  Allergies  Allergen Reactions   Ranolazine Other (See Comments)    Chest discomfort/Reflux   Amoxicillin Hives   Atorvastatin Other (See Comments)    myalgias  Other reaction(s): Myalgias (intolerance)   Pravastatin Other (See Comments)    myalgias  Other reaction(s): Myalgias (intolerance)   Rosuvastatin Other (See Comments)    myalgias  Other reaction(s): Myalgias (intolerance)   Statins Other (See Comments)    myalgias    Patient Measurements: Height: 5\' 11"  (180.3 cm) Weight: 101.6 kg (223 lb 15.8 oz) IBW/kg (Calculated) : 75.3 Heparin Dosing Weight: 95 kg  Vital Signs: Temp: 99.2 F (37.3 C) (08/24 1517) Temp Source: Oral (08/24 1517) BP: 120/69 (08/24 1109)  Labs: Recent Labs    10/28/22 2021 10/28/22 2227 10/29/22 0011 10/29/22 0336 10/29/22 0707 10/29/22 1604  HGB 18.8*  --   --  18.1*  --   --   HCT 56.7*  --   --  54.8*  --   --   PLT 228  --   --  176  --   --   LABPROT  --  14.4  --   --   --   --   INR  --  1.1  --   --   --   --   HEPARINUNFRC  --   --   --   --  0.37 0.26*  CREATININE 1.35*  --   --  1.51*  --   --   CKTOTAL  --   --  363  --   --   --   TROPONINIHS 35* 33*  --   --   --   --     Estimated Creatinine Clearance: 56.8 mL/min (A) (by C-G formula based on SCr of 1.51 mg/dL (H)).  Assessment: 67 y.o. male with PE continues on heparin.  Heparin level down to subtherapeutic (0.26) on infusion at 1800 units/hr. No issues with line or bleeding reported per RN.  Goal of Therapy:  Heparin level 0.3-0.7 units/ml Monitor platelets by anticoagulation protocol: Yes   Plan:  Rebolus heparin 1500 units now Increase heparin to 2000 units / hr F/u 6 hr heparin level  Christoper Fabian, PharmD, BCPS Please see amion for complete clinical pharmacist phone list 10/29/2022,5:38 PM

## 2022-10-29 NOTE — Hospital Course (Addendum)
The patient is a 67 year old obese Caucasian male with a past medical history significant for but not limited too essential hypertension, hyperlipidemia, paroxysmal SVT status post recent ablation as well as other comorbidities who was admitted to [facility], ED for shortness of breath and was found to have an acute PE.  He underwent a ablation for paroxysmal SVT at [facility] on 8 15-8 16 and subsequently the last few days developed shortness of breath but subjective fever and a nonproductive cough.  Denied any PND, worsening of peripheral edema and denied any history of DVT or PE.  Has been on daily aspirin as an outpatient normally.  Further workup showed that he had an acute PE and echocardiogram and extremity venous duplex have been ordered.  Will continue heparin drip and anticipating transition to oral anticoagulation in the morning.  Assessment and Plan:  Acute PE -Presented with 1 to 2 days of progressive shortness of breath associated with subjective fevers, new cough and new supplemental oxygen requirement -Previously underwent a ablation procedure for tachyarrhythmia -CTA of the chest done and showed "Right middle lobe segmental pulmonary artery embolism. No CT evidence of right heart straining. Trace bilateral pleural effusions and bibasilar subpleural atelectasis.  Aortic Atherosclerosis" -Placed on Heparin gtt and continue for now and will need to Transition to DOAC -Check ECHOCardiogram and LE Venous Duplex -ECHO done and show "Left ventricular ejection fraction, by estimation, is 50 to 55%. Left ventricular ejection fraction by PLAX is 54 %. The left ventricle has low normal function. The left ventricle has no regional wall motion abnormalities. There is mild concentric left ventricular hypertrophy. Left ventricular diastolic parameters are consistent with Grade I diastolic dysfunction (impaired relaxation). There is moderate hypokinesis of the left ventricular, basal-mid inferior wall.  Right ventricular systolic function is mildly reduced. The right ventricular size is mildly enlarged. There is normal pulmonary artery systolic pressure. The estimated right ventricular systolic pressure is 22.0 mmHg. Possible atrial level shunting consistent with recent ablation. cannot  exclude a small PFO. The mitral valve is normal in structure. Trivial mitral valve regurgitation. The aortic valve is tricuspid. Aortic valve regurgitation is not visualized. There is borderline dilatation of the ascending aorta, measuring 39 mm. The inferior vena cava is normal in size with greater than 50% respiratory variability, suggesting right atrial pressure of 3 mmHg. Comparison(s): Changes from prior study are noted. 01/19/2021 ([facility]): LVEF 50%, inferior wall hypokinesis." -Troponin went from 35 -> 33 -LE VENOUS showed "RIGHT: There is no evidence of deep vein thrombosis in the lower extremity. No cystic structure found in the popliteal fossa. LEFT: There is no evidence of deep vein thrombosis in the lower extremity. No cystic structure found in the popliteal fossa." -PESI Class at Least III given Age, Sex, and Hypoxia on admission -Patient will need ambulatory home O2 screen and transition to oral anticoagulation likely in the morning  Acute Respiratory Failure with Hypoxia -Patient presents with SOB and Room Air Saturation <90% (80's ) -SpO2: 98 % O2 Flow Rate (L/min): 2 L/min -C/w Guaifenesin-Dextromethorphan 5 mL po q4hprn Cough -Continuous Pulse Oximetry and Maintain O2 Saturations >90% -Continue Supplemental O2 via [place] and Wean O2 as Tolerated -Will need an Ambulatory Home O2 Screen prior to D/C and repeat CXR in the AM  Hyponatremia -Na+ Trend: Recent Labs  Lab 10/04/22 1530 10/28/22 2021 10/29/22 0336  NA 142 134* 134*  -Continue to Monitor and Trend and Repeat CMP in the AM  AKI Metabolic Acidosis -BUN/Cr Trend: Recent Labs  Lab 10/04/22 1530 10/28/22 2021 10/29/22 0336  BUN 19 11  11   CREATININE 1.14 1.35* 1.51*  -Patient has a CO2 of 18, AG of 15, and a Chloride Level of 101 on Admission -U/A relatively unremarkable except Small Hgb on Urine Dipstick -Avoid Nephrotoxic Medications, Contrast Dyes, Hypotension and Dehydration to Ensure Adequate Renal Perfusion and will need to Renally Adjust Meds -Continue to Monitor and Trend Renal Function carefully and repeat CMP in the AM   Erythrocytosis -Hgb/Hct Trend: Recent Labs  Lab 10/04/22 1530 10/28/22 2021 10/28/22 2021 10/29/22 0336  HGB 16.5 18.8*  --  18.1*  HCT 50.1 56.7*   < > 54.8*  MCV 90 85.9   < > 88.8   < > = values in this interval not displayed.  -Continue to Monitor and Trend  GAD -C/w Home Clonazepam 0.5 mg po BIDprn Anxiety  -Not on any Scheduled SSRI's or SNRI's  Essential Hypertension -Holding his home amlodipine for now given his acute pulmonary embolism -Continue to monitor blood pressures per protocol -Last blood pressure reading was 120/69  HLD -C/w Home Ezetimibe 10 mg po Daily  Hypoalbuminemia -Patient's Albumin Trend: Recent Labs  Lab 10/29/22 0011 10/29/22 0336  ALBUMIN 3.1* 3.0*  -Continue to Monitor and Trend and repeat CMP in the AM  Obesity -Complicates overall prognosis and care -Estimated body mass index is 31.24 kg/m as calculated from the following:   Height as of this encounter: 5\' 11"  (1.803 m).   Weight as of this encounter: 101.6 kg.  -Weight Loss and Dietary Counseling given

## 2022-10-29 NOTE — ED Notes (Signed)
Patient transported to CT 

## 2022-10-30 ENCOUNTER — Inpatient Hospital Stay (HOSPITAL_COMMUNITY): Payer: Managed Care, Other (non HMO)

## 2022-10-30 DIAGNOSIS — R509 Fever, unspecified: Secondary | ICD-10-CM

## 2022-10-30 DIAGNOSIS — R7989 Other specified abnormal findings of blood chemistry: Secondary | ICD-10-CM | POA: Diagnosis not present

## 2022-10-30 DIAGNOSIS — I10 Essential (primary) hypertension: Secondary | ICD-10-CM | POA: Diagnosis not present

## 2022-10-30 DIAGNOSIS — I2699 Other pulmonary embolism without acute cor pulmonale: Secondary | ICD-10-CM | POA: Diagnosis not present

## 2022-10-30 DIAGNOSIS — J9601 Acute respiratory failure with hypoxia: Secondary | ICD-10-CM | POA: Diagnosis not present

## 2022-10-30 LAB — CBC WITH DIFFERENTIAL/PLATELET
Abs Immature Granulocytes: 0.03 10*3/uL (ref 0.00–0.07)
Basophils Absolute: 0 10*3/uL (ref 0.0–0.1)
Basophils Relative: 1 %
Eosinophils Absolute: 0.1 10*3/uL (ref 0.0–0.5)
Eosinophils Relative: 2 %
HCT: 49.6 % (ref 39.0–52.0)
Hemoglobin: 16.4 g/dL (ref 13.0–17.0)
Immature Granulocytes: 1 %
Lymphocytes Relative: 23 %
Lymphs Abs: 1 10*3/uL (ref 0.7–4.0)
MCH: 29.7 pg (ref 26.0–34.0)
MCHC: 33.1 g/dL (ref 30.0–36.0)
MCV: 89.7 fL (ref 80.0–100.0)
Monocytes Absolute: 0.3 10*3/uL (ref 0.1–1.0)
Monocytes Relative: 7 %
Neutro Abs: 2.9 10*3/uL (ref 1.7–7.7)
Neutrophils Relative %: 66 %
Platelets: 174 10*3/uL (ref 150–400)
RBC: 5.53 MIL/uL (ref 4.22–5.81)
RDW: 13.9 % (ref 11.5–15.5)
WBC: 4.4 10*3/uL (ref 4.0–10.5)
nRBC: 0 % (ref 0.0–0.2)

## 2022-10-30 LAB — COMPREHENSIVE METABOLIC PANEL
ALT: 24 U/L (ref 0–44)
AST: 31 U/L (ref 15–41)
Albumin: 2.8 g/dL — ABNORMAL LOW (ref 3.5–5.0)
Alkaline Phosphatase: 52 U/L (ref 38–126)
Anion gap: 11 (ref 5–15)
BUN: 12 mg/dL (ref 8–23)
CO2: 16 mmol/L — ABNORMAL LOW (ref 22–32)
Calcium: 7.8 mg/dL — ABNORMAL LOW (ref 8.9–10.3)
Chloride: 107 mmol/L (ref 98–111)
Creatinine, Ser: 1.25 mg/dL — ABNORMAL HIGH (ref 0.61–1.24)
GFR, Estimated: >60 mL/min (ref >60)
Glucose, Bld: 92 mg/dL (ref 70–99)
Potassium: 3.9 mmol/L (ref 3.5–5.1)
Sodium: 134 mmol/L — ABNORMAL LOW (ref 135–145)
Total Bilirubin: 0.7 mg/dL (ref 0.3–1.2)
Total Protein: 5.6 g/dL — ABNORMAL LOW (ref 6.5–8.1)

## 2022-10-30 LAB — PHOSPHORUS: Phosphorus: 2.8 mg/dL (ref 2.5–4.6)

## 2022-10-30 LAB — HEPARIN LEVEL (UNFRACTIONATED)
Heparin Unfractionated: 0.54 [IU]/mL (ref 0.30–0.70)
Heparin Unfractionated: 0.57 [IU]/mL (ref 0.30–0.70)

## 2022-10-30 LAB — MAGNESIUM: Magnesium: 1.9 mg/dL (ref 1.7–2.4)

## 2022-10-30 MED ORDER — SODIUM BICARBONATE 650 MG PO TABS
1300.0000 mg | ORAL_TABLET | Freq: Two times a day (BID) | ORAL | Status: DC
  Administered 2022-10-30 – 2022-11-01 (×6): 1300 mg via ORAL
  Filled 2022-10-30 (×6): qty 2

## 2022-10-30 NOTE — Progress Notes (Signed)
ANTICOAGULATION CONSULT NOTE  Pharmacy Consult for Heparin Indication: pulmonary embolus Brief A/P: Heparin level within goal range Continue Heparin at current rate   Allergies  Allergen Reactions   Ranolazine Other (See Comments)    Chest discomfort/Reflux   Amoxicillin Hives   Atorvastatin Other (See Comments)    myalgias  Other reaction(s): Myalgias (intolerance)   Pravastatin Other (See Comments)    myalgias  Other reaction(s): Myalgias (intolerance)   Rosuvastatin Other (See Comments)    myalgias  Other reaction(s): Myalgias (intolerance)   Statins Other (See Comments)    myalgias    Patient Measurements: Height: 5\' 11"  (180.3 cm) Weight: 101.6 kg (223 lb 15.8 oz) IBW/kg (Calculated) : 75.3 Heparin Dosing Weight: 95 kg  Vital Signs: Temp: 97.6 F (36.4 C) (08/24 2318) Temp Source: Oral (08/24 2318) BP: 132/67 (08/24 2318) Pulse Rate: 73 (08/24 2318)  Labs: Recent Labs    10/28/22 2021 10/28/22 2227 10/29/22 0011 10/29/22 0336 10/29/22 0707 10/29/22 1604 10/30/22 0041  HGB 18.8*  --   --  18.1*  --   --   --   HCT 56.7*  --   --  54.8*  --   --   --   PLT 228  --   --  176  --   --   --   LABPROT  --  14.4  --   --   --   --   --   INR  --  1.1  --   --   --   --   --   HEPARINUNFRC  --   --   --   --  0.37 0.26* 0.54  CREATININE 1.35*  --   --  1.51*  --   --   --   CKTOTAL  --   --  363  --   --   --   --   TROPONINIHS 35* 33*  --   --   --   --   --     Estimated Creatinine Clearance: 56.8 mL/min (A) (by C-G formula based on SCr of 1.51 mg/dL (H)).  Assessment: 67 y.o. male with PE for heparin Goal of Therapy:  Heparin level 0.3-0.7 units/ml Monitor platelets by anticoagulation protocol: Yes   Plan:  No change to heparin   Eddie Candle 10/30/2022,1:34 AM

## 2022-10-30 NOTE — Progress Notes (Signed)
SATURATION QUALIFICATIONS: (This note is used to comply with regulatory documentation for home oxygen)  Patient Saturations on Room Air at Rest = 95%  Patient Saturations on Room Air while Ambulating = 92%   

## 2022-10-30 NOTE — Progress Notes (Signed)
PROGRESS NOTE    Jacob Collier  ZOX:[id number] DOB: April 22, 1955 DOA: 10/28/2022 PCP: Blane Ohara, MD   Brief Narrative:  The patient is a 67 year old obese Caucasian male with a past medical history significant for but not limited too essential hypertension, hyperlipidemia, paroxysmal SVT status post recent ablation as well as other comorbidities who was admitted to [facility], ED for shortness of breath and was found to have an acute PE.  He underwent a ablation for paroxysmal SVT at [facility] on 8 15-8 16 and subsequently the last few days developed shortness of breath but subjective fever and a nonproductive cough.  Denied any PND, worsening of peripheral edema and denied any history of DVT or PE.  Has been on daily aspirin as an outpatient normally.    Further workup showed that he had an acute PE and echocardiogram and extremity venous duplex have been ordered.  Will continue heparin drip and anticipating transition to oral anticoagulation in the morning. Yesterday he had a temperature of 102.1 and will monitor overnight and if afebrile likely can be D/C'd in the AM.   Assessment and Plan:  Acute PE -Presented with 1 to 2 days of progressive shortness of breath associated with subjective fevers, new cough and new supplemental oxygen requirement -Previously underwent a ablation procedure for tachyarrhythmia -CTA of the chest done and showed "Right middle lobe segmental pulmonary artery embolism. No CT evidence of right heart straining. Trace bilateral pleural effusions and bibasilar subpleural atelectasis.  Aortic Atherosclerosis" -Placed on Heparin gtt and continue for now and will need to Transition to DOAC -Check ECHOCardiogram and LE Venous Duplex -ECHO done and show "Left ventricular ejection fraction, by estimation, is 50 to 55%. Left ventricular ejection fraction by PLAX is 54 %. The left ventricle has low normal function. The left ventricle has no regional wall motion abnormalities.  There is mild concentric left ventricular hypertrophy. Left ventricular diastolic parameters are consistent with Grade I diastolic dysfunction (impaired relaxation). There is moderate hypokinesis of the left ventricular, basal-mid inferior wall. Right ventricular systolic function is mildly reduced. The right ventricular size is mildly enlarged. There is normal pulmonary artery systolic pressure. The estimated right ventricular systolic pressure is 22.0 mmHg. Possible atrial level shunting consistent with recent ablation. cannot  exclude a small PFO. The mitral valve is normal in structure. Trivial mitral valve regurgitation. The aortic valve is tricuspid. Aortic valve regurgitation is not visualized. There is borderline dilatation of the ascending aorta, measuring 39 mm. The inferior vena cava is normal in size with greater than 50% respiratory variability, suggesting right atrial pressure of 3 mmHg. Comparison(s): Changes from prior study are noted. 01/19/2021 (TEE): LVEF 50%, inferior wall hypokinesis." -Troponin went from 35 -> 33 -Patient was Febrile yesterday and had a fever of 102.1 yesterday  -LE VENOUS showed "RIGHT: There is no evidence of deep vein thrombosis in the lower extremity. No cystic structure found in the popliteal fossa. LEFT: There is no evidence of deep vein thrombosis in the lower extremity. No cystic structure found in the popliteal fossa." -PESI Class at Least III given Age, Sex, and Hypoxia on admission -Ambulatory home O2 screen  done and transition to oral anticoagulation likely in the morning  Acute Respiratory Failure with Hypoxia, improved  -Patient presents with SOB and Room Air Saturation <90% (80's ) -SpO2: 95 % O2 Flow Rate (L/min): 2 L/min; Now weaned off of O2 and did not Desaturate on Ambulatory Home O2 screen -C/w Guaifenesin-Dextromethorphan 5 mL po q4hprn  Cough -Continuous Pulse Oximetry and Maintain O2 Saturations >90% -Continue Supplemental O2 via [place] and  Wean O2 as Tolerated -Ambulatory Home O2 Screen prior to D/C done today and showed Rest Sats of 95% and Sats on Room Air while Ambulating at 92% -Repeat CXR this AM done and showed "Mild cardiomegaly is exaggerated by low lung volumes. Mild pulmonary vascular congestion is present without frank edema. No focal airspace disease is present. The visualized soft tissues and bony thorax are unremarkable."  Fever -In the setting of Above -Has no leukocytosis but had a fever 102.1 -Repeat CXR as above -Check Blood Cx x2 and pending  -Will need to ensure patient is afebrile for least 24 hours prior to discharging and if afebrile today can change to oral AC and D/C home in the AM  Hyponatremia -Na+ Trend: Recent Labs  Lab 10/04/22 1530 10/28/22 2021 10/29/22 0336 10/30/22 0703  NA 142 134* 134* 134*  -Continue to Monitor and Trend and Repeat CMP in the AM  AKI, improving  Metabolic Acidosis -BUN/Cr Trend: Recent Labs  Lab 10/04/22 1530 10/28/22 2021 10/29/22 0336 10/30/22 0703  BUN 19 11 11 12   CREATININE 1.14 1.35* 1.51* 1.25*  -Patient had a CO2 of 18, AG of 15, and a Chloride Level of 101 on Admission; Now has a CO2 of 16, AG of 11, Chloride Level of 107 -Start patient on Sodium Bicarbonate 1300 mg po BID -U/A relatively unremarkable except Small Hgb on Urine Dipstick -Avoid Nephrotoxic Medications, Contrast Dyes, Hypotension and Dehydration to Ensure Adequate Renal Perfusion and will need to Renally Adjust Meds -Continue to Monitor and Trend Renal Function carefully and repeat CMP in the AM   Erythrocytosis -Hgb/Hct Trend: Recent Labs  Lab 10/04/22 1530 10/28/22 2021 10/28/22 2021 10/29/22 0336 10/30/22 0703  HGB 16.5 18.8*  --  18.1* 16.4  HCT 50.1 56.7*   < > 54.8* 49.6  MCV 90 85.9   < > 88.8 89.7   < > = values in this interval not displayed.  -Continue to Monitor and Trend  GAD -C/w Home Clonazepam 0.5 mg po BIDprn Anxiety  -Not on any Scheduled SSRI's or  SNRI's  Essential Hypertension -Holding his home amlodipine for now given his acute pulmonary embolism -Continue to monitor blood pressures per protocol -Last blood pressure reading was 120/69  HLD -C/w Home Ezetimibe 10 mg po Daily  Hypoalbuminemia -Patient's Albumin Trend: Recent Labs  Lab 10/29/22 0011 10/29/22 0336 10/30/22 0703  ALBUMIN 3.1* 3.0* 2.8*  -Continue to Monitor and Trend and repeat CMP in the AM  Obesity -Complicates overall prognosis and care -Estimated body mass index is 30.78 kg/m as calculated from the following:   Height as of this encounter: 5\' 11"  (1.803 m).   Weight as of this encounter: 100.1 kg.  -Weight Loss and Dietary Counseling given   DVT prophylaxis: Anticoagulated with Heparin gtt    Code Status: Full Code Family Communication: No family present at bedside  Disposition Plan:  Level of care: Telemetry Medical Status is: Inpatient Remains inpatient appropriate because: Had a fever yesterday and is afebrile today can D/C in AM after transitioning to oral anticoagulation   Consultants:  As delineated as above  Procedures:  ECHOCARDIOGRAM IMPRESSIONS     1. Left ventricular ejection fraction, by estimation, is 50 to 55%. Left  ventricular ejection fraction by PLAX is 54 %. The left ventricle has low  normal function. The left ventricle has no regional wall motion  abnormalities. There is mild concentric  left ventricular hypertrophy. Left ventricular diastolic parameters are  consistent with Grade I diastolic dysfunction (impaired relaxation). There  is moderate hypokinesis of the left ventricular, basal-mid inferior wall.   2. Right ventricular systolic function is mildly reduced. The right  ventricular size is mildly enlarged. There is normal pulmonary artery  systolic pressure. The estimated right ventricular systolic pressure is  22.0 mmHg.   3. Possible atrial level shunting consistent with recent ablation. cannot  exclude a  small PFO.   4. The mitral valve is normal in structure. Trivial mitral valve  regurgitation.   5. The aortic valve is tricuspid. Aortic valve regurgitation is not  visualized.   6. There is borderline dilatation of the ascending aorta, measuring 39  mm.   7. The inferior vena cava is normal in size with greater than 50%  respiratory variability, suggesting right atrial pressure of 3 mmHg.   Comparison(s): Changes from prior study are noted. 01/19/2021 (TEE): LVEF  50%, inferior wall hypokinesis.   FINDINGS   Left Ventricle: Left ventricular ejection fraction, by estimation, is 50  to 55%. Left ventricular ejection fraction by PLAX is 54 %. The left  ventricle has low normal function. The left ventricle has no regional wall  motion abnormalities. Moderate  hypokinesis of the left ventricular, basal-mid inferior wall. The left  ventricular internal cavity size was normal in size. There is mild  concentric left ventricular hypertrophy. Left ventricular diastolic  parameters are consistent with Grade I diastolic   dysfunction (impaired relaxation). Indeterminate filling pressures.     LV Wall Scoring:  The posterior wall is hypokinetic.   Right Ventricle: The right ventricular size is mildly enlarged. No  increase in right ventricular wall thickness. Right ventricular systolic  function is mildly reduced. There is normal pulmonary artery systolic  pressure. The tricuspid regurgitant velocity   is 2.18 m/s, and with an assumed right atrial pressure of 3 mmHg, the  estimated right ventricular systolic pressure is 22.0 mmHg.   Left Atrium: Left atrial size was normal in size.   Right Atrium: Right atrial size was normal in size.   Pericardium: There is no evidence of pericardial effusion.   Mitral Valve: The mitral valve is normal in structure. Trivial mitral  valve regurgitation.   Tricuspid Valve: The tricuspid valve is normal in structure. Tricuspid  valve regurgitation is  trivial.   Aortic Valve: The aortic valve is tricuspid. Aortic valve regurgitation is  not visualized.   Pulmonic Valve: The pulmonic valve was normal in structure. Pulmonic valve  regurgitation is trivial.   Aorta: The aortic root is normal in size and structure. There is  borderline dilatation of the ascending aorta, measuring 39 mm.   Venous: The inferior vena cava is normal in size with greater than 50%  respiratory variability, suggesting right atrial pressure of 3 mmHg.   IAS/Shunts: The interatrial septum appears to be lipomatous. Cannot  exclude a small PFO.     LEFT VENTRICLE  PLAX 2D  LV EF:         Left            Diastology                 ventricular     LV e' medial:    5.22 cm/s                 ejection        LV E/e' medial:  7.8  fraction by     LV e' lateral:   11.70 cm/s                 PLAX is 54      LV E/e' lateral: 3.5                 %.  LVIDd:         5.30 cm  LVIDs:         3.80 cm  LV PW:         1.20 cm  LV IVS:        1.10 cm  LVOT diam:     1.90 cm  LV SV:         65  LV SV Index:   29  LVOT Area:     2.84 cm     RIGHT VENTRICLE  RV S prime:     8.49 cm/s  TAPSE (M-mode): 1.4 cm   LEFT ATRIUM             Index        RIGHT ATRIUM           Index  LA diam:        4.00 cm 1.81 cm/m   RA Area:     20.90 cm  LA Vol (A2C):   63.9 ml 28.88 ml/m  RA Volume:   52.10 ml  23.55 ml/m  LA Vol (A4C):   74.2 ml 33.54 ml/m  LA Biplane Vol: 71.3 ml 32.23 ml/m   AORTIC VALVE  LVOT Vmax:   139.00 cm/s  LVOT Vmean:  98.200 cm/s  LVOT VTI:    0.230 m    AORTA  Ao Root diam: 3.50 cm  Ao Asc diam:  3.90 cm   MITRAL VALVE               TRICUSPID VALVE  MV Area (PHT): 2.39 cm    TR Peak grad:   19.0 mmHg  MV Decel Time: 318 msec    TR Vmax:        218.00 cm/s  MV E velocity: 40.60 cm/s  MV A velocity: 55.00 cm/s  SHUNTS  MV E/A ratio:  0.74        Systemic VTI:  0.23 m                             Systemic Diam: 1.90 cm    LE  VENOUS DUPLEX Summary:  RIGHT:   - There is no evidence of deep vein thrombosis in the lower extremity.    - No cystic structure found in the popliteal fossa.    LEFT:    - There is no evidence of deep vein thrombosis in the lower extremity.    - No cystic structure found in the popliteal fossa.     *See table(s) above for measurements and observations.   Antimicrobials:  Anti-infectives (From admission, onward)    None       Subjective: Seen and examined at bedside and he was doing okay and better today.  States he had a temperature yesterday and was "soaked" and woke up with the bed being wet.  No nausea or vomiting.  Denies any chest pain and shortness of breath is improved.  About to ambulate with the nurse tech to do an ambulatory home O2 screen.  No other concerns or complaints at this time.  Objective: Vitals:  10/30/22 0735 10/30/22 1055 10/30/22 1119 10/30/22 1527  BP: 127/81  (!) 98/58 131/77  Pulse:  83    Resp:  20  18  Temp: 98.5 F (36.9 C) 99.4 F (37.4 C)  98.5 F (36.9 C)  TempSrc: Oral Oral Axillary Oral  SpO2:  95%    Weight:      Height:        Intake/Output Summary (Last 24 hours) at 10/30/2022 1558 Last data filed at 10/30/2022 1204 Gross per 24 hour  Intake 768.44 ml  Output 750 ml  Net 18.44 ml   Filed Weights   10/29/22 0117 10/29/22 0246 10/30/22 0301  Weight: 100.7 kg 101.6 kg 100.1 kg   Examination: Physical Exam:  Constitutional: WN/WD obese Caucasian male in no acute distress Respiratory: Diminished to auscultation bilaterally, no wheezing, rales, rhonchi or crackles. Normal respiratory effort and patient is not tachypenic. No accessory muscle use.  Unlabored breathing Cardiovascular: RRR, no murmurs / rubs / gallops. S1 and S2 auscultated.  Trace to mild extremity edema Abdomen: Soft, non-tender, distended secondary to body habitus. Bowel sounds positive.  GU: Deferred. Musculoskeletal: No clubbing / cyanosis of  digits/nails. No joint deformity upper and lower extremities Skin: No rashes, lesions, ulcers on a limited skin evaluation. No induration; Warm and dry.  Neurologic: CN 2-12 grossly intact with no focal deficits. Romberg sign and cerebellar reflexes not assessed.  Psychiatric: Normal judgment and insight. Alert and oriented x 3. Normal mood and appropriate affect.   Data Reviewed: I have personally reviewed following labs and imaging studies  CBC: Recent Labs  Lab 10/28/22 2021 10/29/22 0336 10/30/22 0703  WBC 5.3 5.8 4.4  NEUTROABS  --  4.2 2.9  HGB 18.8* 18.1* 16.4  HCT 56.7* 54.8* 49.6  MCV 85.9 88.8 89.7  PLT 228 176 174   Basic Metabolic Panel: Recent Labs  Lab 10/28/22 2021 10/29/22 0336 10/30/22 0703  NA 134* 134* 134*  K 4.3 4.1 3.9  CL 101 101 107  CO2 18* 24 16*  GLUCOSE 102* 125* 92  BUN 11 11 12   CREATININE 1.35* 1.51* 1.25*  CALCIUM 8.8* 8.3* 7.8*  MG  --  2.2 1.9  PHOS  --  3.2 2.8   GFR: Estimated Creatinine Clearance: 68.2 mL/min (A) (by C-G formula based on SCr of 1.25 mg/dL (H)). Liver Function Tests: Recent Labs  Lab 10/29/22 0011 10/29/22 0336 10/30/22 0703  AST 29 30 31   ALT 23 22 24   ALKPHOS 63 60 52  BILITOT 0.9 0.4 0.7  PROT 6.2* 5.8* 5.6*  ALBUMIN 3.1* 3.0* 2.8*   No results for input(s): "LIPASE", "AMYLASE" in the last 168 hours. No results for input(s): "AMMONIA" in the last 168 hours. Coagulation Profile: Recent Labs  Lab 10/28/22 2227  INR 1.1   Cardiac Enzymes: Recent Labs  Lab 10/29/22 0011  CKTOTAL 363   BNP (last 3 results) No results for input(s): "PROBNP" in the last 8760 hours. HbA1C: No results for input(s): "HGBA1C" in the last 72 hours. CBG: No results for input(s): "GLUCAP" in the last 168 hours. Lipid Profile: No results for input(s): "CHOL", "HDL", "LDLCALC", "TRIG", "CHOLHDL", "LDLDIRECT" in the last 72 hours. Thyroid Function Tests: No results for input(s): "TSH", "T4TOTAL", "FREET4", "T3FREE",  "THYROIDAB" in the last 72 hours. Anemia Panel: No results for input(s): "VITAMINB12", "FOLATE", "FERRITIN", "TIBC", "IRON", "RETICCTPCT" in the last 72 hours. Sepsis Labs: No results for input(s): "PROCALCITON", "LATICACIDVEN" in the last 168 hours.  Recent Results (from the past 240  hour(s))  Culture, blood (Routine X 2) w Reflex to ID Panel     Status: None (Preliminary result)   Collection Time: 10/29/22 12:11 AM   Specimen: BLOOD  Result Value Ref Range Status   Specimen Description BLOOD BLOOD LEFT ARM  Final   Special Requests   Final    BOTTLES DRAWN AEROBIC AND ANAEROBIC Blood Culture adequate volume   Culture   Final    NO GROWTH 1 DAY Performed at [facility], [address] [place], [place] [address]    Report Status PENDING  Incomplete  Culture, blood (Routine X 2) w Reflex to ID Panel     Status: None (Preliminary result)   Collection Time: 10/29/22 12:11 AM   Specimen: BLOOD  Result Value Ref Range Status   Specimen Description BLOOD BLOOD RIGHT HAND  Final   Special Requests   Final    BOTTLES DRAWN AEROBIC AND ANAEROBIC Blood Culture adequate volume   Culture   Final    NO GROWTH 1 DAY Performed at [facility], [address] [place], [place] [address]    Report Status PENDING  Incomplete  SARS Coronavirus 2 by RT PCR (hospital order, performed in [facility] hospital lab) *cepheid single result test* Anterior Nasal Swab     Status: None   Collection Time: 10/29/22  1:15 AM   Specimen: Anterior Nasal Swab  Result Value Ref Range Status   SARS Coronavirus 2 by RT PCR NEGATIVE NEGATIVE Final    Comment: Performed at [facility], [address] [place], [place] [address]  MRSA Next Gen by PCR, Nasal     Status: None   Collection Time: 10/29/22  2:37 AM   Specimen: Nasal Mucosa; Nasal Swab  Result Value Ref Range Status   MRSA by PCR Next Gen NOT DETECTED NOT DETECTED Final    Comment: (NOTE) The GeneXpert MRSA Assay (FDA approved for  NASAL specimens only), is one component of a comprehensive MRSA colonization surveillance program. It is not intended to diagnose MRSA infection nor to guide or monitor treatment for MRSA infections. Test performance is not FDA approved in patients less than 80 years old. Performed at [facility], [address], [place], [place] [address]   Culture, blood (Routine X 2) w Reflex to ID Panel     Status: None (Preliminary result)   Collection Time: 10/29/22  8:27 PM   Specimen: BLOOD  Result Value Ref Range Status   Specimen Description BLOOD BLOOD RIGHT HAND  Final   Special Requests   Final    BOTTLES DRAWN AEROBIC AND ANAEROBIC Blood Culture adequate volume   Culture   Final    NO GROWTH < 12 HOURS Performed at [facility], [address], [place], [place] [address]    Report Status PENDING  Incomplete  Culture, blood (Routine X 2) w Reflex to ID Panel     Status: None (Preliminary result)   Collection Time: 10/29/22  8:29 PM   Specimen: BLOOD  Result Value Ref Range Status   Specimen Description BLOOD BLOOD RIGHT ARM  Final   Special Requests   Final    BOTTLES DRAWN AEROBIC AND ANAEROBIC Blood Culture adequate volume   Culture   Final    NO GROWTH < 12 HOURS Performed at [facility], [address] [place], [place] [address]    Report Status PENDING  Incomplete    Radiology Studies: DG CHEST  PORT 1 VIEW  Result Date: 10/30/2022 CLINICAL DATA:  Fever. EXAM: PORTABLE CHEST 1 VIEW COMPARISON:  Two-view chest x-ray 10/28/2022 FINDINGS: Mild cardiomegaly is exaggerated by low lung volumes. Mild pulmonary vascular congestion is present without frank edema. No focal airspace disease is present. The visualized soft tissues and bony thorax are unremarkable. IMPRESSION: Mild cardiomegaly and pulmonary vascular congestion without frank edema. Electronically Signed   By: Marin Roberts M.D.   On: 10/30/2022 13:56   ECHOCARDIOGRAM COMPLETE  Result Date:  10/29/2022    ECHOCARDIOGRAM REPORT   Patient Name:   RAMSSES STMARIE Date of Exam: 10/29/2022 Medical Rec #:  [id number]      Height:       71.0 in Accession #:    [id number]     Weight:       224.0 lb Date of Birth:  [birth year]     BSA:          2.213 m Patient Age:    66 years       BP:           120/71 mmHg Patient Gender: M              HR:           80 bpm. Exam Location:  Inpatient Procedure: 2D Echo, Color Doppler and Cardiac Doppler Indications:    I26.02 Pulmonary embolus  History:        Patient has prior history of Echocardiogram examinations, most                 recent 01/15/2021. Prior CABG; Risk Factors:Hypertension and                 Dyslipidemia.  Sonographer:    Irving Burton Senior RDCS Referring Phys: [id number] Angie Fava IMPRESSIONS  1. Left ventricular ejection fraction, by estimation, is 50 to 55%. Left ventricular ejection fraction by PLAX is 54 %. The left ventricle has low normal function. The left ventricle has no regional wall motion abnormalities. There is mild concentric left ventricular hypertrophy. Left ventricular diastolic parameters are consistent with Grade I diastolic dysfunction (impaired relaxation). There is moderate hypokinesis of the left ventricular, basal-mid inferior wall.  2. Right ventricular systolic function is mildly reduced. The right ventricular size is mildly enlarged. There is normal pulmonary artery systolic pressure. The estimated right ventricular systolic pressure is 22.0 mmHg.  3. Possible atrial level shunting consistent with recent ablation. cannot exclude a small PFO.  4. The mitral valve is normal in structure. Trivial mitral valve regurgitation.  5. The aortic valve is tricuspid. Aortic valve regurgitation is not visualized.  6. There is borderline dilatation of the ascending aorta, measuring 39 mm.  7. The inferior vena cava is normal in size with greater than 50% respiratory variability, suggesting right atrial pressure of 3 mmHg. Comparison(s): Changes  from prior study are noted. 01/19/2021 (TEE): LVEF 50%, inferior wall hypokinesis. FINDINGS  Left Ventricle: Left ventricular ejection fraction, by estimation, is 50 to 55%. Left ventricular ejection fraction by PLAX is 54 %. The left ventricle has low normal function. The left ventricle has no regional wall motion abnormalities. Moderate hypokinesis of the left ventricular, basal-mid inferior wall. The left ventricular internal cavity size was normal in size. There is mild concentric left ventricular hypertrophy. Left ventricular diastolic parameters are consistent with Grade I diastolic  dysfunction (impaired relaxation). Indeterminate filling pressures.  LV Wall Scoring: The posterior wall is hypokinetic. Right Ventricle: The right ventricular size  is mildly enlarged. No increase in right ventricular wall thickness. Right ventricular systolic function is mildly reduced. There is normal pulmonary artery systolic pressure. The tricuspid regurgitant velocity  is 2.18 m/s, and with an assumed right atrial pressure of 3 mmHg, the estimated right ventricular systolic pressure is 22.0 mmHg. Left Atrium: Left atrial size was normal in size. Right Atrium: Right atrial size was normal in size. Pericardium: There is no evidence of pericardial effusion. Mitral Valve: The mitral valve is normal in structure. Trivial mitral valve regurgitation. Tricuspid Valve: The tricuspid valve is normal in structure. Tricuspid valve regurgitation is trivial. Aortic Valve: The aortic valve is tricuspid. Aortic valve regurgitation is not visualized. Pulmonic Valve: The pulmonic valve was normal in structure. Pulmonic valve regurgitation is trivial. Aorta: The aortic root is normal in size and structure. There is borderline dilatation of the ascending aorta, measuring 39 mm. Venous: The inferior vena cava is normal in size with greater than 50% respiratory variability, suggesting right atrial pressure of 3 mmHg. IAS/Shunts: The interatrial  septum appears to be lipomatous. Cannot exclude a small PFO.  LEFT VENTRICLE PLAX 2D LV EF:         Left            Diastology                ventricular     LV e' medial:    5.22 cm/s                ejection        LV E/e' medial:  7.8                fraction by     LV e' lateral:   11.70 cm/s                PLAX is 54      LV E/e' lateral: 3.5                %. LVIDd:         5.30 cm LVIDs:         3.80 cm LV PW:         1.20 cm LV IVS:        1.10 cm LVOT diam:     1.90 cm LV SV:         65 LV SV Index:   29 LVOT Area:     2.84 cm  RIGHT VENTRICLE RV S prime:     8.49 cm/s TAPSE (M-mode): 1.4 cm LEFT ATRIUM             Index        RIGHT ATRIUM           Index LA diam:        4.00 cm 1.81 cm/m   RA Area:     20.90 cm LA Vol (A2C):   63.9 ml 28.88 ml/m  RA Volume:   52.10 ml  23.55 ml/m LA Vol (A4C):   74.2 ml 33.54 ml/m LA Biplane Vol: 71.3 ml 32.23 ml/m  AORTIC VALVE LVOT Vmax:   139.00 cm/s LVOT Vmean:  98.200 cm/s LVOT VTI:    0.230 m  AORTA Ao Root diam: 3.50 cm Ao Asc diam:  3.90 cm MITRAL VALVE               TRICUSPID VALVE MV Area (PHT): 2.39 cm    TR Peak grad:   19.0 mmHg MV Decel Time:  318 msec    TR Vmax:        218.00 cm/s MV E velocity: 40.60 cm/s MV A velocity: 55.00 cm/s  SHUNTS MV E/A ratio:  0.74        Systemic VTI:  0.23 m                            Systemic Diam: 1.90 cm Zoila Shutter MD Electronically signed by Zoila Shutter MD Signature Date/Time: 10/29/2022/1:13:16 PM    Final    VAS [place] LOWER EXTREMITY VENOUS (DVT)  Result Date: 10/29/2022  Lower Venous DVT Study Patient Name:  TOLBERT GAAL  Date of Exam:   10/29/2022 Medical Rec #: [id number]       Accession #:    [id number] Date of Birth: Feb 12, 1956      Patient Gender: M Patient Age:   23 years Exam Location:  [facility] Procedure:      VAS [place] LOWER EXTREMITY VENOUS (DVT) Referring Phys: Marguerita Merles --------------------------------------------------------------------------------  Indications: Pulmonary embolism.   Risk Factors: Confirmed PE. Anticoagulation: Heparin. Comparison Study: No prior studies. Performing Technologist: Chanda Busing RVT  Examination Guidelines: A complete evaluation includes B-mode imaging, spectral Doppler, color Doppler, and power Doppler as needed of all accessible portions of each vessel. Bilateral testing is considered an integral part of a complete examination. Limited examinations for reoccurring indications may be performed as noted. The reflux portion of the exam is performed with the patient in reverse Trendelenburg.  +---------+---------------+---------+-----------+----------+--------------+ RIGHT    CompressibilityPhasicitySpontaneityPropertiesThrombus Aging +---------+---------------+---------+-----------+----------+--------------+ CFV      Full           Yes      Yes                                 +---------+---------------+---------+-----------+----------+--------------+ SFJ      Full                                                        +---------+---------------+---------+-----------+----------+--------------+ FV Prox  Full                                                        +---------+---------------+---------+-----------+----------+--------------+ FV Mid   Full                                                        +---------+---------------+---------+-----------+----------+--------------+ FV DistalFull                                                        +---------+---------------+---------+-----------+----------+--------------+ PFV      Full                                                        +---------+---------------+---------+-----------+----------+--------------+  POP      Full           Yes      Yes                                 +---------+---------------+---------+-----------+----------+--------------+ PTV      Full                                                         +---------+---------------+---------+-----------+----------+--------------+ PERO     Full                                                        +---------+---------------+---------+-----------+----------+--------------+   +---------+---------------+---------+-----------+----------+--------------+ LEFT     CompressibilityPhasicitySpontaneityPropertiesThrombus Aging +---------+---------------+---------+-----------+----------+--------------+ CFV      Full           Yes      Yes                                 +---------+---------------+---------+-----------+----------+--------------+ SFJ      Full                                                        +---------+---------------+---------+-----------+----------+--------------+ FV Prox  Full                                                        +---------+---------------+---------+-----------+----------+--------------+ FV Mid   Full                                                        +---------+---------------+---------+-----------+----------+--------------+ FV DistalFull                                                        +---------+---------------+---------+-----------+----------+--------------+ PFV      Full                                                        +---------+---------------+---------+-----------+----------+--------------+ POP      Full           Yes      Yes                                 +---------+---------------+---------+-----------+----------+--------------+  PTV      Full                                                        +---------+---------------+---------+-----------+----------+--------------+ PERO     Full                                                        +---------+---------------+---------+-----------+----------+--------------+     Summary: RIGHT: - There is no evidence of deep vein thrombosis in the lower extremity.  - No cystic structure found in  the popliteal fossa.  LEFT: - There is no evidence of deep vein thrombosis in the lower extremity.  - No cystic structure found in the popliteal fossa.  *See table(s) above for measurements and observations. Electronically signed by Coral Else MD on 10/29/2022 at 10:34:58 AM.    Final    CT Angio Chest Pulmonary Embolism (PE) W or WO Contrast  Result Date: 10/29/2022 CLINICAL DATA:  Shortness of breath. Concern for pulmonary embolism. EXAM: CT ANGIOGRAPHY CHEST WITH CONTRAST TECHNIQUE: Multidetector CT imaging of the chest was performed using the standard protocol during bolus administration of intravenous contrast. Multiplanar CT image reconstructions and MIPs were obtained to evaluate the vascular anatomy. RADIATION DOSE REDUCTION: This exam was performed according to the departmental dose-optimization program which includes automated exposure control, adjustment of the mA and/or kV according to patient size and/or use of iterative reconstruction technique. CONTRAST:  75mL OMNIPAQUE IOHEXOL 350 MG/ML SOLN COMPARISON:  Chest CT dated 01/06/2022. FINDINGS: Cardiovascular: Mild cardiomegaly. No pericardial effusion. There is coronary vascular calcification and postsurgical changes of CABG. Mild atherosclerotic calcification of the thoracic aorta. No aneurysmal dilatation. Right middle lobe segmental pulmonary artery embolism. No CT evidence of right heart straining. Mediastinum/Nodes: No hilar adenopathy. The esophagus is grossly unremarkable. No mediastinal fluid collection. Lungs/Pleura: Trace bilateral pleural effusions and bibasilar subpleural atelectasis. No pneumothorax. The central airways are patent. Upper Abdomen: No acute abnormality. Musculoskeletal: Degenerative changes of the spine. Median sternotomy wires. No acute osseous pathology. Review of the MIP images confirms the above findings. IMPRESSION: 1. Right middle lobe segmental pulmonary artery embolism. No CT evidence of right heart straining.  2. Trace bilateral pleural effusions and bibasilar subpleural atelectasis. 3.  Aortic Atherosclerosis (ICD10-I70.0). These results were called by telephone at the time of interpretation on 10/29/2022 at 12:44 am to provider [facility] , who verbally acknowledged these results. Electronically Signed   By: Elgie Collard M.D.   On: 10/29/2022 00:46   DG Chest 2 View  Result Date: 10/28/2022 CLINICAL DATA:  Shortness of breath. EXAM: CHEST - 2 VIEW COMPARISON:  PA Lat 05/14/2022 FINDINGS: The cardiac size is normal. There are old CABG changes. Slight aortic tortuosity with mild atherosclerosis and stable mediastinum. The lungs are clear of infiltrates. A calcified granuloma is again noted in the left lower lung field. Thoracic spondylosis and mild dextroscoliosis. Three level lower cervical ACDF plating is again shown. IMPRESSION: No active cardiopulmonary disease. Old granulomatous disease. Post CABG chest. Electronically Signed   By: Almira Bar M.D.   On: 10/28/2022 21:12     Scheduled Meds:  ezetimibe  10 mg Oral Daily  pneumococcal 20-valent conjugate vaccine  0.5 mL Intramuscular Tomorrow-1000   sodium bicarbonate  1,300 mg Oral BID   Continuous Infusions:  heparin 2,000 Units/hr (10/30/22 1204)    LOS: 1 day   Marguerita Merles, DO [facility] Available via Epic secure chat 7am-7pm After these hours, please refer to coverage provider listed on [email or web address] 10/30/2022, 3:58 PM

## 2022-10-31 ENCOUNTER — Other Ambulatory Visit (HOSPITAL_COMMUNITY): Payer: Self-pay

## 2022-10-31 DIAGNOSIS — I2699 Other pulmonary embolism without acute cor pulmonale: Secondary | ICD-10-CM | POA: Diagnosis not present

## 2022-10-31 LAB — PHOSPHORUS: Phosphorus: 3.1 mg/dL (ref 2.5–4.6)

## 2022-10-31 LAB — CBC WITH DIFFERENTIAL/PLATELET
Abs Immature Granulocytes: 0.02 10*3/uL (ref 0.00–0.07)
Basophils Absolute: 0 10*3/uL (ref 0.0–0.1)
Basophils Relative: 1 %
Eosinophils Absolute: 0 10*3/uL (ref 0.0–0.5)
Eosinophils Relative: 1 %
HCT: 51.8 % (ref 39.0–52.0)
Hemoglobin: 17.1 g/dL — ABNORMAL HIGH (ref 13.0–17.0)
Immature Granulocytes: 1 %
Lymphocytes Relative: 32 %
Lymphs Abs: 1.1 10*3/uL (ref 0.7–4.0)
MCH: 27.9 pg (ref 26.0–34.0)
MCHC: 33 g/dL (ref 30.0–36.0)
MCV: 84.6 fL (ref 80.0–100.0)
Monocytes Absolute: 0.3 10*3/uL (ref 0.1–1.0)
Monocytes Relative: 8 %
Neutro Abs: 2.1 10*3/uL (ref 1.7–7.7)
Neutrophils Relative %: 57 %
Platelets: 185 10*3/uL (ref 150–400)
RBC: 6.12 MIL/uL — ABNORMAL HIGH (ref 4.22–5.81)
RDW: 13.7 % (ref 11.5–15.5)
WBC: 3.6 10*3/uL — ABNORMAL LOW (ref 4.0–10.5)
nRBC: 0 % (ref 0.0–0.2)

## 2022-10-31 LAB — COMPREHENSIVE METABOLIC PANEL
ALT: 29 U/L (ref 0–44)
AST: 42 U/L — ABNORMAL HIGH (ref 15–41)
Albumin: 3 g/dL — ABNORMAL LOW (ref 3.5–5.0)
Alkaline Phosphatase: 54 U/L (ref 38–126)
Anion gap: 9 (ref 5–15)
BUN: 10 mg/dL (ref 8–23)
CO2: 22 mmol/L (ref 22–32)
Calcium: 8.3 mg/dL — ABNORMAL LOW (ref 8.9–10.3)
Chloride: 102 mmol/L (ref 98–111)
Creatinine, Ser: 1.26 mg/dL — ABNORMAL HIGH (ref 0.61–1.24)
GFR, Estimated: >60 mL/min (ref >60)
Glucose, Bld: 95 mg/dL (ref 70–99)
Potassium: 3.8 mmol/L (ref 3.5–5.1)
Sodium: 133 mmol/L — ABNORMAL LOW (ref 135–145)
Total Bilirubin: 0.8 mg/dL (ref 0.3–1.2)
Total Protein: 5.9 g/dL — ABNORMAL LOW (ref 6.5–8.1)

## 2022-10-31 LAB — HEPARIN LEVEL (UNFRACTIONATED): Heparin Unfractionated: 0.56 [IU]/mL (ref 0.30–0.70)

## 2022-10-31 LAB — MAGNESIUM: Magnesium: 2 mg/dL (ref 1.7–2.4)

## 2022-10-31 NOTE — TOC Benefit Eligibility Note (Signed)
Patient Product/process development scientist completed.    The patient is insured through  [employer] . Patient has Medicare and is not eligible for a copay card, but may be able to apply for patient assistance, if available.    Ran test claim for Eliquis Starter Pack and the current 30 day co-pay is $11.20.   This test claim was processed through [facility]- copay amounts may vary at other pharmacies due to pharmacy/plan contracts, or as the patient moves through the different stages of their insurance plan.     Roland Earl, CPHT Pharmacy Technician III Certified Patient Advocate [facility] Patient Advocate Team Direct Number: [phone]  Fax: [phone]

## 2022-10-31 NOTE — Progress Notes (Addendum)
Mobility Specialist Progress Note:   10/31/22 1056  Mobility  Activity Ambulated with assistance in hallway  Level of Assistance Standby assist, set-up cues, supervision of patient - no hands on  Assistive Device  (IV Pole)  Distance Ambulated (ft) 340 ft  Activity Response Tolerated well  Mobility Referral Yes  $Mobility charge 1 Mobility  Mobility Specialist Start Time (ACUTE ONLY) 1020  Mobility Specialist Stop Time (ACUTE ONLY) 1030  Mobility Specialist Time Calculation (min) (ACUTE ONLY) 10 min   Pre Mobility: 86 HR , 93% SpO2 RA During Mobility: 93 HR , 95% SpO2 RA  Post Mobility: 84 HR , 93% SpO2 RA  Pt received in bed, agreeable to mobility. Denied any discomfort during ambulation, asymptomatic throughout. Pt returned to bed with call bell in hand and all needs met.   Leory Plowman  Mobility Specialist Please contact via [employer] office at [phone]

## 2022-10-31 NOTE — Progress Notes (Signed)
   10/31/22 1019  Spiritual Encounters  Type of Visit Initial  Care provided to: Patient  Conversation partners present during encounter Nurse  Referral source Clinical staff  Reason for visit Routine spiritual support  OnCall Visit No  Spiritual Framework  Presenting Themes Meaning/purpose/sources of inspiration  Community/Connection Family;Friend(s)  Patient Stress Factors None identified  Family Stress Factors None identified  Interventions  Spiritual Care Interventions Made Established relationship of care and support;Reflective listening;Explored values/beliefs/practices/strengths  Intervention Outcomes  Outcomes Connection to spiritual care;Connection to values and goals of care   Chaplain Levon Hedger went to visit Pt, originally because of a consult requesting Pt receiving education for Advanced Care Directives. When Chaplain arrived in the room, Pt mentioned he already had the document and that he would fill it. Chaplain invited Pt to explore how he feels while being at the hospital. Pt shared with Chaplain about how grateful he is with God and life for what Pt calls as having repeated opportunities to continue living. Pt feels God may be asking him for a specific mission he will gratefully fulfill. Pt shared with Chaplain about how his plans in life are connected with his faith beliefs and how these days are making him think and reconsider some of the goals in his life. Pt was grateful for Chaplain's visit.

## 2022-10-31 NOTE — Progress Notes (Signed)
   10/31/22 1557  TOC Brief Assessment  Insurance and Status Reviewed  Patient has primary care physician Yes  Home environment has been reviewed home  Prior level of function: self/independent  Prior/Current Home Services No current home services  Social Determinants of Health Reivew SDOH reviewed no interventions necessary  Readmission risk has been reviewed Yes  Transition of care needs no transition of care needs at this time

## 2022-10-31 NOTE — Progress Notes (Signed)
PROGRESS NOTE    Jacob Collier  OZH:[id number] DOB: 1955/09/27 DOA: 10/28/2022 PCP: Blane Ohara, MD    Brief Narrative:  This 67 year old obese male with PMH significant for Essential hypertension, hyperlipidemia, paroxysmal SVT status post recent ablation presented in the ED for shortness of breath and found to have acute PE. He underwent ablation for paroxysmal SVT at [facility] on 8/15-8/16 and subsequently in the last few days developed shortness of breath, Subjective fever and  nonproductive cough.  Patient is admitted for further evaluation.  Assessment & Plan:   Principal Problem:   Acute pulmonary embolism (HCC) Active Problems:   Essential hypertension   Hyperlipidemia   Acute hypoxic respiratory failure (HCC)   Elevated troponin   GAD (generalized anxiety disorder)   Acute pulmonary embolism: Patient presented with progressive shortness of breath associated with subjective fever, cough and new oxygen requirement. Patient underwent ablation procedure for tachyarrhythmia.   CTA chest showed  right middle lobe pulmonary embolism.No evidence of right heart strain. Continue IV heparin with the plan to transition to DOAC before discharge. Echo showed LVEF 50 to 55%, Left ventricle has normal function.  Right systolic function mildly reduced. Troponin slightly elevated could be due to demand ischemia Lower extremity venous duplex >  No evidence of DVT in the lower extremities. Ambulatory home oxygen needs to be checked before discharge.  Acute hypoxic respiratory failure likely secondary to acute PE Patient successfully weaned down to room air. Chest x-ray mild cardiomegaly,  no focal infiltrate.  Fever: Likely reactive, Patient has no leukocytosis, had a fever 102.1 Chest x-ray no focal infiltrate. Follow-up blood and urine cultures. Patient need to remain afebrile for 24 hours before discharge.  Hyponatremia: Continue to monitor, serum sodium improving  Acute  kidney injury: Likely due to dehydration, avoid nephrotoxic medication Serum creatinine improving.  Metabolic acidosis: Resolved.  Continue sodium bicarbonate.  Generalized anxiety disorder Continue clonazepam 0.5 mg twice daily  Essential hypertension: Blood pressure is well-controlled. Resume amlodipine.  Hyperlipidemia: Continue Zetia 10 mg daily   Obesity: Diet and exercise discussed in detail. Estimated body mass index is 30.44 kg/m as calculated from the following:   Height as of this encounter: 5\' 11"  (1.803 m).   Weight as of this encounter: 99 kg.    DVT prophylaxis: Heparin IV Code Status: Full code. Family Communication: No family at bed side. Disposition Plan:     Status is: Inpatient Remains inpatient appropriate because: Admitted for acute hypoxic respiratory failure secondary to acute pulmonary embolism requiring IV heparin.    Consultants:  None  Procedures: CTA chest Antimicrobials:  Anti-infectives (From admission, onward)    None      Subjective: Patient was seen and examined at bedside.  Overnight events noted.   Patient reports doing much better,  he remains on room air. Denies any chest pain,  shortness of breath or palpitations.  Objective: Vitals:   10/30/22 2341 10/31/22 0538 10/31/22 0731 10/31/22 1042  BP: (!) 140/79 127/76 127/80 (!) 138/92  Pulse: 89 74  82  Resp: 20 20  15   Temp: 97.7 F (36.5 C) 99.7 F (37.6 C) 98.5 F (36.9 C) 98.5 F (36.9 C)  TempSrc: Oral Axillary Oral Oral  SpO2: 99% 97%  95%  Weight:  99 kg    Height:        Intake/Output Summary (Last 24 hours) at 10/31/2022 1116 Last data filed at 10/31/2022 0700 Gross per 24 hour  Intake 541.02 ml  Output 325 ml  Net 216.02 ml   Filed Weights   10/29/22 0246 10/30/22 0301 10/31/22 0538  Weight: 101.6 kg 100.1 kg 99 kg    Examination:  General exam: Appears calm and comfortable, not in any acute distress Respiratory system: Clear to auscultation.  Respiratory effort normal.  RR 16 Cardiovascular system: S1 & S2 heard, RRR. No JVD, murmurs, rubs, gallops or clicks. No pedal edema. Gastrointestinal system: Abdomen is soft, non distended, non tender.Normal bowel sounds heard. Central nervous system: Alert and oriented x 3. No focal neurological deficits. Extremities: Symmetric 5 x 5 power. Skin: No rashes, lesions or ulcers Psychiatry: Judgement and insight appear normal. Mood & affect appropriate.     Data Reviewed: I have personally reviewed following labs and imaging studies  CBC: Recent Labs  Lab 10/28/22 2021 10/29/22 0336 10/30/22 0703 10/31/22 0335  WBC 5.3 5.8 4.4 3.6*  NEUTROABS  --  4.2 2.9 2.1  HGB 18.8* 18.1* 16.4 17.1*  HCT 56.7* 54.8* 49.6 51.8  MCV 85.9 88.8 89.7 84.6  PLT 228 176 174 185   Basic Metabolic Panel: Recent Labs  Lab 10/28/22 2021 10/29/22 0336 10/30/22 0703 10/31/22 0335  NA 134* 134* 134* 133*  K 4.3 4.1 3.9 3.8  CL 101 101 107 102  CO2 18* 24 16* 22  GLUCOSE 102* 125* 92 95  BUN 11 11 12 10   CREATININE 1.35* 1.51* 1.25* 1.26*  CALCIUM 8.8* 8.3* 7.8* 8.3*  MG  --  2.2 1.9 2.0  PHOS  --  3.2 2.8 3.1   GFR: Estimated Creatinine Clearance: 67.3 mL/min (A) (by C-G formula based on SCr of 1.26 mg/dL (H)). Liver Function Tests: Recent Labs  Lab 10/29/22 0011 10/29/22 0336 10/30/22 0703 10/31/22 0335  AST 29 30 31  42*  ALT 23 22 24 29   ALKPHOS 63 60 52 54  BILITOT 0.9 0.4 0.7 0.8  PROT 6.2* 5.8* 5.6* 5.9*  ALBUMIN 3.1* 3.0* 2.8* 3.0*   No results for input(s): "LIPASE", "AMYLASE" in the last 168 hours. No results for input(s): "AMMONIA" in the last 168 hours. Coagulation Profile: Recent Labs  Lab 10/28/22 2227  INR 1.1   Cardiac Enzymes: Recent Labs  Lab 10/29/22 0011  CKTOTAL 363   BNP (last 3 results) No results for input(s): "PROBNP" in the last 8760 hours. HbA1C: No results for input(s): "HGBA1C" in the last 72 hours. CBG: No results for input(s): "GLUCAP"  in the last 168 hours. Lipid Profile: No results for input(s): "CHOL", "HDL", "LDLCALC", "TRIG", "CHOLHDL", "LDLDIRECT" in the last 72 hours. Thyroid Function Tests: No results for input(s): "TSH", "T4TOTAL", "FREET4", "T3FREE", "THYROIDAB" in the last 72 hours. Anemia Panel: No results for input(s): "VITAMINB12", "FOLATE", "FERRITIN", "TIBC", "IRON", "RETICCTPCT" in the last 72 hours. Sepsis Labs: No results for input(s): "PROCALCITON", "LATICACIDVEN" in the last 168 hours.  Recent Results (from the past 240 hour(s))  Culture, blood (Routine X 2) w Reflex to ID Panel     Status: None (Preliminary result)   Collection Time: 10/29/22 12:11 AM   Specimen: BLOOD  Result Value Ref Range Status   Specimen Description BLOOD BLOOD LEFT ARM  Final   Special Requests   Final    BOTTLES DRAWN AEROBIC AND ANAEROBIC Blood Culture adequate volume   Culture   Final    NO GROWTH 1 DAY Performed at [facility], [address] [place], [place] [address]    Report Status PENDING  Incomplete  Culture, blood (Routine X 2) w Reflex to ID Panel  Status: None (Preliminary result)   Collection Time: 10/29/22 12:11 AM   Specimen: BLOOD  Result Value Ref Range Status   Specimen Description BLOOD BLOOD RIGHT HAND  Final   Special Requests   Final    BOTTLES DRAWN AEROBIC AND ANAEROBIC Blood Culture adequate volume   Culture   Final    NO GROWTH 1 DAY Performed at [facility], [address] [place], [place] [address]    Report Status PENDING  Incomplete  SARS Coronavirus 2 by RT PCR (hospital order, performed in [facility] hospital lab) *cepheid single result test* Anterior Nasal Swab     Status: None   Collection Time: 10/29/22  1:15 AM   Specimen: Anterior Nasal Swab  Result Value Ref Range Status   SARS Coronavirus 2 by RT PCR NEGATIVE NEGATIVE Final    Comment: Performed at [facility], [address], [place], [place] [address]  MRSA Next Gen by PCR, Nasal     Status:  None   Collection Time: 10/29/22  2:37 AM   Specimen: Nasal Mucosa; Nasal Swab  Result Value Ref Range Status   MRSA by PCR Next Gen NOT DETECTED NOT DETECTED Final    Comment: (NOTE) The GeneXpert MRSA Assay (FDA approved for NASAL specimens only), is one component of a comprehensive MRSA colonization surveillance program. It is not intended to diagnose MRSA infection nor to guide or monitor treatment for MRSA infections. Test performance is not FDA approved in patients less than 68 years old. Performed at [facility], [address] [place], [place] [address]   Culture, blood (Routine X 2) w Reflex to ID Panel     Status: None (Preliminary result)   Collection Time: 10/29/22  8:27 PM   Specimen: BLOOD  Result Value Ref Range Status   Specimen Description BLOOD BLOOD RIGHT HAND  Final   Special Requests   Final    BOTTLES DRAWN AEROBIC AND ANAEROBIC Blood Culture adequate volume   Culture   Final    NO GROWTH < 12 HOURS Performed at [facility], [address] [place], [place] [address]    Report Status PENDING  Incomplete  Culture, blood (Routine X 2) w Reflex to ID Panel     Status: None (Preliminary result)   Collection Time: 10/29/22  8:29 PM   Specimen: BLOOD  Result Value Ref Range Status   Specimen Description BLOOD BLOOD RIGHT ARM  Final   Special Requests   Final    BOTTLES DRAWN AEROBIC AND ANAEROBIC Blood Culture adequate volume   Culture   Final    NO GROWTH < 12 HOURS Performed at [facility], [address], [place], [place] [address]    Report Status PENDING  Incomplete    Radiology Studies: DG CHEST PORT 1 VIEW  Result Date: 10/30/2022 CLINICAL DATA:  Fever. EXAM: PORTABLE CHEST 1 VIEW COMPARISON:  Two-view chest x-ray 10/28/2022 FINDINGS: Mild cardiomegaly is exaggerated by low lung volumes. Mild pulmonary vascular congestion is present without frank edema. No focal airspace disease is present. The visualized soft tissues and bony  thorax are unremarkable. IMPRESSION: Mild cardiomegaly and pulmonary vascular congestion without frank edema. Electronically Signed   By: Marin Roberts M.D.   On: 10/30/2022 13:56    Scheduled Meds:  ezetimibe  10 mg Oral Daily   pneumococcal 20-valent conjugate vaccine  0.5 mL Intramuscular Tomorrow-1000   sodium bicarbonate  1,300 mg Oral BID   Continuous  Infusions:  heparin 2,000 Units/hr (10/31/22 0505)     LOS: 2 days    Time spent: 50 mins    Willeen Niece, MD [facility]   If 7PM-7AM, please contact night-coverage

## 2022-10-31 NOTE — Plan of Care (Signed)
  Problem: Education: Goal: Understanding of disease, treatment, and recovery process will improve Outcome: Progressing   Problem: Activity: Goal: Ability to return to baseline activity level will improve Outcome: Progressing   Problem: Cardiac: Goal: Ability to maintain adequate cardiovascular perfusion will improve Outcome: Progressing Goal: Vascular access site(s) Level 0-1 will be maintained Outcome: Progressing   Problem: Health Behavior/ Discharge Planning: Goal: Ability to safely manage health related needs after discharge Outcome: Progressing   Problem: Education: Goal: Knowledge of General Education information will improve Description: Including pain rating scale, medication(s)/side effects and non-pharmacologic comfort measures Outcome: Progressing   Problem: Health Behavior/Discharge Planning: Goal: Ability to manage health-related needs will improve Outcome: Progressing   Problem: Clinical Measurements: Goal: Ability to maintain clinical measurements within normal limits will improve Outcome: Progressing Goal: Will remain free from infection Outcome: Progressing Goal: Diagnostic test results will improve Outcome: Progressing Goal: Respiratory complications will improve Outcome: Progressing Goal: Cardiovascular complication will be avoided Outcome: Progressing   Problem: Activity: Goal: Risk for activity intolerance will decrease Outcome: Progressing   Problem: Nutrition: Goal: Adequate nutrition will be maintained Outcome: Progressing   Problem: Coping: Goal: Level of anxiety will decrease Outcome: Progressing   Problem: Elimination: Goal: Will not experience complications related to bowel motility Outcome: Progressing Goal: Will not experience complications related to urinary retention Outcome: Progressing   Problem: Pain Managment: Goal: General experience of comfort will improve Outcome: Progressing   Problem: Safety: Goal: Ability to  remain free from injury will improve Outcome: Progressing   Problem: Skin Integrity: Goal: Risk for impaired skin integrity will decrease Outcome: Progressing   

## 2022-11-01 ENCOUNTER — Other Ambulatory Visit (HOSPITAL_COMMUNITY): Payer: Self-pay

## 2022-11-01 ENCOUNTER — Telehealth: Payer: Self-pay

## 2022-11-01 DIAGNOSIS — Z951 Presence of aortocoronary bypass graft: Secondary | ICD-10-CM

## 2022-11-01 DIAGNOSIS — I2510 Atherosclerotic heart disease of native coronary artery without angina pectoris: Secondary | ICD-10-CM | POA: Diagnosis not present

## 2022-11-01 DIAGNOSIS — I2699 Other pulmonary embolism without acute cor pulmonale: Secondary | ICD-10-CM | POA: Diagnosis not present

## 2022-11-01 DIAGNOSIS — J9601 Acute respiratory failure with hypoxia: Secondary | ICD-10-CM | POA: Diagnosis not present

## 2022-11-01 DIAGNOSIS — R7989 Other specified abnormal findings of blood chemistry: Secondary | ICD-10-CM | POA: Diagnosis not present

## 2022-11-01 LAB — BASIC METABOLIC PANEL
Anion gap: 9 (ref 5–15)
BUN: 10 mg/dL (ref 8–23)
CO2: 20 mmol/L — ABNORMAL LOW (ref 22–32)
Calcium: 8.1 mg/dL — ABNORMAL LOW (ref 8.9–10.3)
Chloride: 107 mmol/L (ref 98–111)
Creatinine, Ser: 1.21 mg/dL (ref 0.61–1.24)
GFR, Estimated: >60 mL/min (ref >60)
Glucose, Bld: 134 mg/dL — ABNORMAL HIGH (ref 70–99)
Potassium: 3.5 mmol/L (ref 3.5–5.1)
Sodium: 136 mmol/L (ref 135–145)

## 2022-11-01 LAB — CBC
HCT: 48.5 % (ref 39.0–52.0)
Hemoglobin: 16.2 g/dL (ref 13.0–17.0)
MCH: 28 pg (ref 26.0–34.0)
MCHC: 33.4 g/dL (ref 30.0–36.0)
MCV: 83.9 fL (ref 80.0–100.0)
Platelets: 185 10*3/uL (ref 150–400)
RBC: 5.78 MIL/uL (ref 4.22–5.81)
RDW: 13.9 % (ref 11.5–15.5)
WBC: 4.4 10*3/uL (ref 4.0–10.5)
nRBC: 0 % (ref 0.0–0.2)

## 2022-11-01 LAB — HEPARIN LEVEL (UNFRACTIONATED): Heparin Unfractionated: 0.52 [IU]/mL (ref 0.30–0.70)

## 2022-11-01 LAB — GLUCOSE, CAPILLARY: Glucose-Capillary: 117 mg/dL — ABNORMAL HIGH (ref 70–99)

## 2022-11-01 MED ORDER — CLOPIDOGREL BISULFATE 75 MG PO TABS
75.0000 mg | ORAL_TABLET | Freq: Every day | ORAL | Status: DC
  Filled 2022-11-01: qty 1

## 2022-11-01 MED ORDER — APIXABAN 5 MG PO TABS: 5.0000 mg | ORAL_TABLET | Freq: Two times a day (BID) | ORAL | Status: DC

## 2022-11-01 MED ORDER — ASPIRIN 81 MG PO TBEC
81.0000 mg | DELAYED_RELEASE_TABLET | Freq: Every day | ORAL | Status: DC
  Administered 2022-11-01 – 2022-11-02 (×2): 81 mg via ORAL
  Filled 2022-11-01 (×3): qty 1

## 2022-11-01 MED ORDER — APIXABAN 5 MG PO TABS
10.0000 mg | ORAL_TABLET | Freq: Two times a day (BID) | ORAL | Status: DC
  Administered 2022-11-01 – 2022-11-02 (×3): 10 mg via ORAL
  Filled 2022-11-01 (×3): qty 2

## 2022-11-01 NOTE — Progress Notes (Signed)
Mobility Specialist Progress Note:   11/01/22 1019  Mobility  Activity Ambulated with assistance in hallway  Level of Assistance Contact guard assist, steadying assist  Assistive Device None  Distance Ambulated (ft) 350 ft  Activity Response Tolerated well  Mobility Referral Yes  $Mobility charge 1 Mobility  Mobility Specialist Start Time (ACUTE ONLY) 1000  Mobility Specialist Stop Time (ACUTE ONLY) 1010  Mobility Specialist Time Calculation (min) (ACUTE ONLY) 10 min   Pre Mobility: 73 HR During Mobility: 90 HR ,  94% SpO2 Post Mobility: 68 HR ,  94% SpO2  Pt received in bed, agreeable to mobility. Denied any feelings of discomfort, asymptomatic throughout. Pt returned to bed with call bell in reach and all needs met.   Leory Plowman  Mobility Specialist Please contact via [employer] office at [phone]

## 2022-11-01 NOTE — Telephone Encounter (Signed)
Pharmacy Patient Advocate Encounter   Received notification from CoverMyMeds that prior authorization for REPATHA is required/requested.   Insurance verification completed.   The patient is insured through [facility] .   Per test claim: PA required; PA submitted to [facility] via CoverMyMeds Key/confirmation #/[id number] Status is pending

## 2022-11-01 NOTE — Plan of Care (Signed)
  Problem: Education: Goal: Understanding of disease, treatment, and recovery process will improve Outcome: Progressing   Problem: Activity: Goal: Ability to return to baseline activity level will improve Outcome: Progressing   Problem: Cardiac: Goal: Ability to maintain adequate cardiovascular perfusion will improve Outcome: Progressing Goal: Vascular access site(s) Level 0-1 will be maintained Outcome: Progressing   Problem: Health Behavior/ Discharge Planning: Goal: Ability to safely manage health related needs after discharge Outcome: Progressing   Problem: Education: Goal: Knowledge of General Education information will improve Description: Including pain rating scale, medication(s)/side effects and non-pharmacologic comfort measures Outcome: Progressing   Problem: Health Behavior/Discharge Planning: Goal: Ability to manage health-related needs will improve Outcome: Progressing   Problem: Clinical Measurements: Goal: Ability to maintain clinical measurements within normal limits will improve Outcome: Progressing Goal: Will remain free from infection Outcome: Progressing Goal: Diagnostic test results will improve Outcome: Progressing Goal: Respiratory complications will improve Outcome: Progressing Goal: Cardiovascular complication will be avoided Outcome: Progressing   Problem: Activity: Goal: Risk for activity intolerance will decrease Outcome: Progressing   Problem: Nutrition: Goal: Adequate nutrition will be maintained Outcome: Progressing   Problem: Coping: Goal: Level of anxiety will decrease Outcome: Progressing   Problem: Elimination: Goal: Will not experience complications related to bowel motility Outcome: Progressing Goal: Will not experience complications related to urinary retention Outcome: Progressing   Problem: Pain Managment: Goal: General experience of comfort will improve Outcome: Progressing   Problem: Safety: Goal: Ability to  remain free from injury will improve Outcome: Progressing   Problem: Skin Integrity: Goal: Risk for impaired skin integrity will decrease Outcome: Progressing   

## 2022-11-01 NOTE — Discharge Instructions (Signed)
Information on my medicine - ELIQUIS® (apixaban) ° °Why was Eliquis® prescribed for you? °Eliquis® was prescribed for you to reduce the risk of forming blood clots that can cause a stroke if you have a medical condition called atrial fibrillation (a type of irregular heartbeat) OR to reduce the risk of a blood clots forming after orthopedic surgery. ° °What do You need to know about Eliquis® ? °Take your Eliquis® TWICE DAILY - one tablet in the morning and one tablet in the evening with or without food.  It would be best to take the doses about the same time each day. ° °If you have difficulty swallowing the tablet whole please discuss with your pharmacist how to take the medication safely. ° °Take Eliquis® exactly as prescribed by your doctor and DO NOT stop taking Eliquis® without talking to the doctor who prescribed the medication.  Stopping may increase your risk of developing a new clot or stroke.  Refill your prescription before you run out. ° °After discharge, you should have regular check-up appointments with your healthcare provider that is prescribing your Eliquis®.  In the future your dose may need to be changed if your kidney function or weight changes by a significant amount or as you get older. ° °What do you do if you miss a dose? °If you miss a dose, take it as soon as you remember on the same day and resume taking twice daily.  Do not take more than one dose of ELIQUIS at the same time. ° °Important Safety Information °A possible side effect of Eliquis® is bleeding. You should call your healthcare provider right away if you experience any of the following: °Bleeding from an injury or your nose that does not stop. °Unusual colored urine (red or dark brown) or unusual colored stools (red or black). °Unusual bruising for unknown reasons. °A serious fall or if you hit your head (even if there is no bleeding). ° °Some medicines may interact with Eliquis® and might increase your risk of bleeding or  clotting while on Eliquis®. To help avoid this, consult your healthcare provider or pharmacist prior to using any new prescription or non-prescription medications, including herbals, vitamins, non-steroidal anti-inflammatory drugs (NSAIDs) and supplements. ° °This website has more information on Eliquis® (apixaban): http://www.eliquis.com/eliquis/home °  °

## 2022-11-01 NOTE — Consult Note (Addendum)
Thank you very much  Cardiology Consultation   Patient ID: Jacob Collier MRN: [id number]; DOB: 04-12-1955  Admit date: 10/28/2022 Date of Consult: 11/01/2022  PCP:  Blane Ohara, MD   [facility] Providers Cardiologist:  Nicki Guadalajara, MD  Cardiology APP:  Corrin Parker, PA-C  Electrophysiologist:  Maurice Small, MD  {   Patient Profile:   Jacob Collier is a 67 y.o. male with a hx of CAD s/p DES to RCA in 01/2019 and then CABG x4 (LIMA to LAD, reverse SVG to PDA, reverse SVG to OM1, and reverse SVG to 1st Diag) in 01/2021, left pleural effusion following CABG s/p thoracentesis, post-op atrial fibrillation not on anticoagulation, paroxysmal SVT noted on monitor in 07/2019, hypertension, hyperlipidemia intolerant to statins, anxiety, prior prostate cancer, and polycythemia who is being seen 11/01/2022 for the evaluation of anticoagulation/antiplatelet therapy at the request of Dr. Idelle Leech.  History of Present Illness:   Jacob Collier is a 67 yo male with PMH noted above who has been followed by Dr. Tresa Endo as an outpatient. He was previously followed at [facility]. Underwent cardiac cath 01/2019 at [facility] showing CTO of Lcx and RCA with successful PCI/DEA to [facility]. Wore Zio monitor 02/2019 showing freq PVCS (8.5% burden). Myoview 05/2019 with inferolateral  wall defect with associated WMA compatible with prior MI. Placed on Ranexa but unable to tolerate. Presented to [facility] later that month with chest pain and underwent repeat cardiac cath with ISR/CTO of RCA with medical therapy recommended, consideration of CABG if failed medical therapy.   Presented to the [facility] ED 06/2019 with palpitations and tightness in his neck and found to have SVT with rates in the 200s. Placed on Toprol XL. Outpatient event monitor showed an average heart rate of 65 bpm and occasional PVCs/ventricular couplets, one 4 beat episodes of non-sustained VT, and a 36 second episode of SVT with rates in the 170s. There  were no pauses or evidence of atrial fibrillation. Toprol-XL was increased.   At office visit on 12/2020 reported recurrent chest pain, outpatient cardiac cath showed severe 3 vessel CAD with a new 75% mid LAD lesion compared to prior cath in 2021. Patient was referred to CT surgery for consideration of CABG. Underwent 4v CABG with LIMA-LAD, reverse SVG-PDA, SVG-OM1, and SVG-Diag 1 with Dr. Cliffton Asters. Echo showed LVEF of 55% with hypokinesis of antero-lateral, posterior and basal inferior wall, g2DD, mild MR and mild dilatation of ascending aorta of 41mm. Developed post op atrial fibrillation, converted to sinus rhythm with IV amiodarone. Post discharge he required a left sided thoracentesis.   Called the office on 03/2022 with complaints of elevated HR. PCP attempted to add Diltiazem but he did not tolerate very well. Ordered for outpatient event monitor, noted occasional episodes of SVT. Given unable to tolerate higher doses of metoprolol, he was referred to EP. He was seen by Dr. Nelly Laurence and EP study with successful ablation.   Presented to the ED on 8/24 with complaints of shortness of breath. Admission labs showed Na+ 135, K+ 4.3, Cr 1.35, BNP 40, hsTn 35>>33, WBC 5.3, Hgb 18. CXR negative. Underwent CT angio and found to have right middle lobe PE with no evidence of right heart strain. Echo showed LVEF of 50-55%, mildly reduced RV function. Initially placed on IV heparin, now transitioned to Eliquis. Cardiology asked to evaluate in regards to need for antiplatelet with new need of anticoagulation.   Past Medical History:  Diagnosis Date   Anxiety  Arthritis    Coronary artery disease    a. S/p DES to RCA in 01/2019 b. s/p CABG x4 (LIMA-LAD, reverse SVG-PDA, reverse SVG-OM1, reverse SVG-1st Diag) in 01/2021   Diverticulosis    Dyspnea    Hard of hearing    Hyperlipidemia    Hypertension    hx of elevation on lyrica, off med and now normal    Paroxysmal SVT (supraventricular tachycardia)     Post-op atrial fibrillation    Prostate cancer (HCC)    Wears glasses     Past Surgical History:  Procedure Laterality Date   ANTERIOR CERVICAL DECOMP/DISCECTOMY FUSION N/A 09/03/2021   Procedure: CERVICAL FIVE-SIX, CERVICAL SIX-SEVEN ANTERIOR CERVICAL DECOMPRESSION/DISCECTOMY FUSION;  Surgeon: Julio Sicks, MD;  Location: [facility] OR;  Service: Neurosurgery;  Laterality: N/A;   CARDIAC CATHETERIZATION     with 2 stents    COLECTOMY  05/2018   COLONOSCOPY     CORONARY ARTERY BYPASS GRAFT N/A 01/19/2021   Procedure: CORONARY ARTERY BYPASS GRAFTING (CABG), ON PUMP TIMES FOUR, USING LEFT INTERNAL MAMMARY ARTERY AND ENDOSOCPICALLY HARVESTED RIGHT GREATER SAPHENOUS VEIN;  Surgeon: Corliss Skains, MD;  Location: [facility] OR;  Service: Open Heart Surgery;  Laterality: N/A;   CYSTOSCOPY WITH URETHRAL DILATATION N/A 01/14/2021   Procedure: CYSTOSCOPY WITH BALLOON  DILATATION;  Surgeon: Alfredo Martinez, MD;  Location: [facility] ORS;  Service: Urology;  Laterality: N/A;   EYE SURGERY  02/2020   bilaterl cataracts   IR THORACENTESIS ASP PLEURAL SPACE W/IMG GUIDE  02/02/2021   LEFT HEART CATH AND CORONARY ANGIOGRAPHY N/A 12/24/2020   Procedure: LEFT HEART CATH AND CORONARY ANGIOGRAPHY;  Surgeon: Swaziland, Peter M, MD;  Location: [facility] INVASIVE CV LAB;  Service: Cardiovascular;  Laterality: N/A;   PROSTATECTOMY     SVT ABLATION N/A 10/20/2022   Procedure: SVT ABLATION;  Surgeon: Maurice Small, MD;  Location: [facility] INVASIVE CV LAB;  Service: Cardiovascular;  Laterality: N/A;   TEE WITHOUT CARDIOVERSION N/A 01/19/2021   Procedure: TRANSESOPHAGEAL ECHOCARDIOGRAM (TEE);  Surgeon: Corliss Skains, MD;  Location: [facility] OR;  Service: Open Heart Surgery;  Laterality: N/A;   TOTAL HIP ARTHROPLASTY Left 11/10/2015   Procedure: LEFT TOTAL HIP ARTHROPLASTY ANTERIOR APPROACH;  Surgeon: Kathryne Hitch, MD;  Location: [facility] OR;  Service: Orthopedics;  Laterality: Left;     Home Medications:  Prior to Admission medications    Medication Sig Start Date End Date Taking? Authorizing Provider  acetaminophen (TYLENOL) 500 MG tablet Take 1,000 mg by mouth every 8 (eight) hours as needed for mild pain.   Yes [provider]  amLODipine (NORVASC) 5 MG tablet Take 1 tablet (5 mg) by mouth every morning & take 0.5 tablet (2.5 mg) by mouth at night. 10/26/22  Yes Lennette Bihari, MD  aspirin EC 81 MG tablet Take 81 mg by mouth daily. Swallow whole.   Yes [provider]  benzonatate (TESSALON) 100 MG capsule Take by mouth. 10/27/22  Yes [provider]  clonazePAM (KLONOPIN) 1 MG tablet TAKE 1 TABLET BY MOUTH TWICE A DAY AS NEEDED FOR ANXIETY Patient taking differently: Take 1 mg by mouth 2 (two) times daily. TAKE 1 TABLET BY MOUTH TWICE A DAY AS NEEDED FOR ANXIETY 10/25/22  Yes Sirivol, Mamatha, MD  clopidogrel (PLAVIX) 75 MG tablet TAKE 1 TABLET (75 MG TOTAL) BY MOUTH DAILY. STOP TAKING PLAVIX ON SATURDAY NOV. 5, 2022 Patient taking differently: Take 75 mg by mouth daily. 11/03/21  Yes Lennette Bihari, MD  cyclobenzaprine (FLEXERIL) 10  MG tablet Take 10 mg by mouth 3 (three) times daily as needed for muscle spasms. 10/13/22  Yes [provider]  Eszopiclone 3 MG TABS TAKE 1 TABLET BY MOUTH EVERYDAY AT BEDTIME Patient taking differently: Take 3 mg by mouth daily as needed (sleep). 05/31/22  Yes Cox, Kirsten, MD  Evolocumab (REPATHA SURECLICK) 140 MG/ML SOAJ INJECT 140 MG INTO THE SKIN EVERY 14 (FOURTEEN) DAYS. 01/31/22  Yes Lennette Bihari, MD  ezetimibe (ZETIA) 10 MG tablet TAKE 1 TABLET BY MOUTH EVERY DAY 05/10/22  Yes Lennette Bihari, MD  Omega-3 Fatty Acids (OMEGA 3 PO) Take 500 mg by mouth every morning.   Yes [provider]  Phenylephrine HCl (AFRIN ALLERGY NA) Place 2 sprays into the nose 2 (two) times daily.   Yes [provider]    Inpatient Medications: Scheduled Meds:  apixaban  10 mg Oral BID   Followed by   Melene Muller ON 11/08/2022] apixaban  5 mg Oral BID   aspirin EC   81 mg Oral Daily   ezetimibe  10 mg Oral Daily   pneumococcal 20-valent conjugate vaccine  0.5 mL Intramuscular Tomorrow-1000   sodium bicarbonate  1,300 mg Oral BID   Continuous Infusions:  PRN Meds: acetaminophen **OR** acetaminophen, clonazePAM, fentaNYL (SUBLIMAZE) injection, guaiFENesin-dextromethorphan, melatonin, naLOXone (NARCAN)  injection, ondansetron (ZOFRAN) IV  Allergies:    Allergies  Allergen Reactions   Ranolazine Other (See Comments)    Chest discomfort/Reflux   Amoxicillin Hives   Atorvastatin Other (See Comments)    myalgias  Other reaction(s): Myalgias (intolerance)   Pravastatin Other (See Comments)    myalgias  Other reaction(s): Myalgias (intolerance)   Rosuvastatin Other (See Comments)    myalgias  Other reaction(s): Myalgias (intolerance)   Statins Other (See Comments)    myalgias    Social History:   Social History   Socioeconomic History   Marital status: Married    Spouse name: Not on file   Number of children: 0   Years of education: Not on file   Highest education level: Not on file  Occupational History   Occupation: Employed at [employer]  Tobacco Use   Smoking status: Never   Smokeless tobacco: Never  Vaping Use   Vaping status: Never Used  Substance and Sexual Activity   Alcohol use: Not Currently    Alcohol/week: 0.0 standard drinks of alcohol    Comment: rarely   Drug use: No   Sexual activity: Yes    Partners: Female    Birth control/protection: None  Other Topics Concern   Not on file  Social History Narrative   Not on file   Social Determinants of Health   Financial Resource Strain: Low Risk  (08/04/2022)   Overall Financial Resource Strain (CARDIA)    Difficulty of Paying Living Expenses: Not hard at all  Food Insecurity: No Food Insecurity (10/29/2022)   Hunger Vital Sign    Worried About Running Out of Food in the Last Year: Never true    Ran Out of Food in the Last Year: Never true  Transportation Needs:  No Transportation Needs (10/29/2022)   PRAPARE - Administrator, Civil Service (Medical): No    Lack of Transportation (Non-Medical): No  Physical Activity: Sufficiently Active (08/04/2022)   Exercise Vital Sign    Days of Exercise per Week: 5 days    Minutes of Exercise per Session: 60 min  Stress: No Stress Concern Present (08/04/2022)   [employer] of Occupational Health -  Occupational Stress Questionnaire    Feeling of Stress : Not at all  Social Connections: Moderately Isolated (08/04/2022)   Social Connection and Isolation Panel [NHANES]    Frequency of Communication with Friends and Family: More than three times a week    Frequency of Social Gatherings with Friends and Family: More than three times a week    Attends Religious Services: Never    Database administrator or Organizations: No    Attends Banker Meetings: Never    Marital Status: Married  Catering manager Violence: Not At Risk (10/29/2022)   Humiliation, Afraid, Rape, and Kick questionnaire    Fear of Current or Ex-Partner: No    Emotionally Abused: No    Physically Abused: No    Sexually Abused: No    Family History:    Family History  Problem Relation Age of Onset   Alzheimer's disease Mother    Stroke Father    Diabetes Sister    Heart attack Maternal Grandmother    Cancer Maternal Uncle        prostate   Prostate cancer Maternal Uncle    Cancer Maternal Uncle        prostate   Prostate cancer Maternal Uncle    Colon polyps Brother    Colon cancer Neg Hx    Esophageal cancer Neg Hx    Rectal cancer Neg Hx    Stomach cancer Neg Hx      ROS:  Please see the history of present illness.   All other ROS reviewed and negative.     Physical Exam/Data:   Vitals:   10/31/22 2358 11/01/22 0500 11/01/22 0849 11/01/22 1127  BP:   127/89 117/73  Pulse:   67 62  Resp:  20 17 19   Temp: (!) 97.5 F (36.4 C) 98 F (36.7 C) (!) 96.9 F (36.1 C) 98.4 F (36.9 C)  TempSrc: Oral  Axillary Oral Oral  SpO2:   91% 90%  Weight:  106.3 kg    Height:        Intake/Output Summary (Last 24 hours) at 11/01/2022 1441 Last data filed at 11/01/2022 0855 Gross per 24 hour  Intake 1251.86 ml  Output --  Net 1251.86 ml      11/01/2022    5:00 AM 10/31/2022    5:38 AM 10/30/2022    3:01 AM  Last 3 Weights  Weight (lbs) 234 lb 5.6 oz 218 lb 4.1 oz 220 lb 10.9 oz  Weight (kg) 106.3 kg 99 kg 100.1 kg     Body mass index is 32.69 kg/m.  General:  Well nourished, well developed, in no acute distress HEENT: normal Neck: no JVD Vascular: No carotid bruits; Distal pulses 2+ bilaterally Cardiac:  normal S1, S2; RRR; soft systolic murmur  Lungs:  clear to auscultation bilaterally, no wheezing, rhonchi or rales  Abd: soft, nontender, no hepatomegaly  Ext: no edema Musculoskeletal:  No deformities, BUE and BLE strength normal and equal Skin: warm and dry  Neuro:  CNs 2-12 intact, no focal abnormalities noted Psych:  Normal affect   EKG:  The EKG was personally reviewed and demonstrates:  Sinus Rhythm, 79 bpm, LVH  Relevant CV Studies:  Echo: 10/29/2022  IMPRESSIONS     1. Left ventricular ejection fraction, by estimation, is 50 to 55%. Left  ventricular ejection fraction by PLAX is 54 %. The left ventricle has low  normal function. The left ventricle has no regional wall motion  abnormalities. There is  mild concentric  left ventricular hypertrophy. Left ventricular diastolic parameters are  consistent with Grade I diastolic dysfunction (impaired relaxation). There  is moderate hypokinesis of the left ventricular, basal-mid inferior wall.   2. Right ventricular systolic function is mildly reduced. The right  ventricular size is mildly enlarged. There is normal pulmonary artery  systolic pressure. The estimated right ventricular systolic pressure is  22.0 mmHg.   3. Possible atrial level shunting consistent with recent ablation. cannot  exclude a small PFO.   4. The  mitral valve is normal in structure. Trivial mitral valve  regurgitation.   5. The aortic valve is tricuspid. Aortic valve regurgitation is not  visualized.   6. There is borderline dilatation of the ascending aorta, measuring 39  mm.   7. The inferior vena cava is normal in size with greater than 50%  respiratory variability, suggesting right atrial pressure of 3 mmHg.   Comparison(s): Changes from prior study are noted. 01/19/2021 (TEE): LVEF  50%, inferior wall hypokinesis.  => This is  FINDINGS   Left Ventricle: Left ventricular ejection fraction, by estimation, is 50  to 55%. Left ventricular ejection fraction by PLAX is 54 %. The left  ventricle has low normal function. The left ventricle has no regional wall  motion abnormalities. Moderate  hypokinesis of the left ventricular, basal-mid inferior wall. The left  ventricular internal cavity size was normal in size. There is mild  concentric left ventricular hypertrophy. Left ventricular diastolic  parameters are consistent with Grade I diastolic   dysfunction (impaired relaxation). Indeterminate filling pressures.     LV Wall Scoring:  The posterior wall is hypokinetic.   Right Ventricle: The right ventricular size is mildly enlarged. No  increase in right ventricular wall thickness. Right ventricular systolic  function is mildly reduced. There is normal pulmonary artery systolic  pressure. The tricuspid regurgitant velocity   is 2.18 m/s, and with an assumed right atrial pressure of 3 mmHg, the  estimated right ventricular systolic pressure is 22.0 mmHg.   Left Atrium: Left atrial size was normal in size.   Right Atrium: Right atrial size was normal in size.   Pericardium: There is no evidence of pericardial effusion.   Mitral Valve: The mitral valve is normal in structure. Trivial mitral  valve regurgitation.   Tricuspid Valve: The tricuspid valve is normal in structure. Tricuspid  valve regurgitation is trivial.    Aortic Valve: The aortic valve is tricuspid. Aortic valve regurgitation is  not visualized.   Pulmonic Valve: The pulmonic valve was normal in structure. Pulmonic valve  regurgitation is trivial.   Aorta: The aortic root is normal in size and structure. There is  borderline dilatation of the ascending aorta, measuring 39 mm.   Venous: The inferior vena cava is normal in size with greater than 50%  respiratory variability, suggesting right atrial pressure of 3 mmHg.   IAS/Shunts: The interatrial septum appears to be lipomatous. Cannot  exclude a small PFO.    Laboratory Data:  High Sensitivity Troponin:   Recent Labs  Lab 10/28/22 2021 10/28/22 2227  TROPONINIHS 35* 33*     Chemistry Recent Labs  Lab 10/29/22 0336 10/30/22 0703 10/31/22 0335 11/01/22 0923  NA 134* 134* 133* 136  K 4.1 3.9 3.8 3.5  CL 101 107 102 107  CO2 24 16* 22 20*  GLUCOSE 125* 92 95 134*  BUN 11 12 10 10   CREATININE 1.51* 1.25* 1.26* 1.21  CALCIUM 8.3* 7.8* 8.3* 8.1*  MG  2.2 1.9 2.0  --   GFRNONAA 50* >60 >60 >60  ANIONGAP 9 11 9 9     Recent Labs  Lab 10/29/22 0336 10/30/22 0703 10/31/22 0335  PROT 5.8* 5.6* 5.9*  ALBUMIN 3.0* 2.8* 3.0*  AST 30 31 42*  ALT 22 24 29   ALKPHOS 60 52 54  BILITOT 0.4 0.7 0.8   Lipids No results for input(s): "CHOL", "TRIG", "HDL", "LABVLDL", "LDLCALC", "CHOLHDL" in the last 168 hours.  Hematology Recent Labs  Lab 10/30/22 0703 10/31/22 0335 11/01/22 0359  WBC 4.4 3.6* 4.4  RBC 5.53 6.12* 5.78  HGB 16.4 17.1* 16.2  HCT 49.6 51.8 48.5  MCV 89.7 84.6 83.9  MCH 29.7 27.9 28.0  MCHC 33.1 33.0 33.4  RDW 13.9 13.7 13.9  PLT 174 185 185   Thyroid No results for input(s): "TSH", "FREET4" in the last 168 hours.  BNP Recent Labs  Lab 10/28/22 2021  BNP 40.3    DDimer No results for input(s): "DDIMER" in the last 168 hours.   Radiology/Studies:  DG CHEST PORT 1 VIEW  Result Date: 10/30/2022 CLINICAL DATA:  Fever. EXAM: PORTABLE CHEST 1 VIEW  COMPARISON:  Two-view chest x-ray 10/28/2022 FINDINGS: Mild cardiomegaly is exaggerated by low lung volumes. Mild pulmonary vascular congestion is present without frank edema. No focal airspace disease is present. The visualized soft tissues and bony thorax are unremarkable. IMPRESSION: Mild cardiomegaly and pulmonary vascular congestion without frank edema. Electronically Signed   By: Marin Roberts M.D.   On: 10/30/2022 13:56 VAS [place] LOWER EXTREMITY VENOUS (DVT)  Result Date: 10/29/2022  Lower Venous DVT Study Patient Name:  JOHNAVAN DOEDEN  Date of Exam:   10/29/2022 Medical Rec #: [id number]       Accession #:    [id number] Date of Birth: 11/08/1955      Patient Gender: M Patient Age:   44 years Exam Location:  [facility] Procedure:      VAS [place] LOWER EXTREMITY VENOUS (DVT) Referring Phys: Marguerita Merles --------------------------------------------------------------------------------  Indications: Pulmonary embolism.  Risk Factors: Confirmed PE. Anticoagulation: Heparin. Comparison Study: No prior studies. Performing Technologist: Chanda Busing RVT  Examination Guidelines: A complete evaluation includes B-mode imaging, spectral Doppler, color Doppler, and power Doppler as needed of all accessible portions of each vessel. Bilateral testing is considered an integral part of a complete examination. Limited examinations for reoccurring indications may be performed as noted. The reflux portion of the exam is performed with the patient in reverse Trendelenburg.  +---------+---------------+---------+-----------+----------+--------------+ RIGHT    CompressibilityPhasicitySpontaneityPropertiesThrombus Aging +---------+---------------+---------+-----------+----------+--------------+ CFV      Full           Yes      Yes                                 +---------+---------------+---------+-----------+----------+--------------+ SFJ      Full                                                         +---------+---------------+---------+-----------+----------+--------------+ FV Prox  Full                                                        +---------+---------------+---------+-----------+----------+--------------+  FV Mid   Full                                                        +---------+---------------+---------+-----------+----------+--------------+ FV DistalFull                                                        +---------+---------------+---------+-----------+----------+--------------+ PFV      Full                                                        +---------+---------------+---------+-----------+----------+--------------+ POP      Full           Yes      Yes                                 +---------+---------------+---------+-----------+----------+--------------+ PTV      Full                                                        +---------+---------------+---------+-----------+----------+--------------+ PERO     Full                                                        +---------+---------------+---------+-----------+----------+--------------+   +---------+---------------+---------+-----------+----------+--------------+ LEFT     CompressibilityPhasicitySpontaneityPropertiesThrombus Aging +---------+---------------+---------+-----------+----------+--------------+ CFV      Full           Yes      Yes                                 +---------+---------------+---------+-----------+----------+--------------+ SFJ      Full                                                        +---------+---------------+---------+-----------+----------+--------------+ FV Prox  Full                                                        +---------+---------------+---------+-----------+----------+--------------+ FV Mid   Full                                                         +---------+---------------+---------+-----------+----------+--------------+  FV DistalFull                                                        +---------+---------------+---------+-----------+----------+--------------+ PFV      Full                                                        +---------+---------------+---------+-----------+----------+--------------+ POP      Full           Yes      Yes                                 +---------+---------------+---------+-----------+----------+--------------+ PTV      Full                                                        +---------+---------------+---------+-----------+----------+--------------+ PERO     Full                                                        +---------+---------------+---------+-----------+----------+--------------+     Summary: RIGHT: - There is no evidence of deep vein thrombosis in the lower extremity.  - No cystic structure found in the popliteal fossa.  LEFT: - There is no evidence of deep vein thrombosis in the lower extremity.  - No cystic structure found in the popliteal fossa.  *See table(s) above for measurements and observations. Electronically signed by Coral Else MD on 10/29/2022 at 10:34:58 AM.    Final    CT Angio Chest Pulmonary Embolism (PE) W or WO Contrast  Result Date: 10/29/2022 CLINICAL DATA:  Shortness of breath. Concern for pulmonary embolism. EXAM: CT ANGIOGRAPHY CHEST WITH CONTRAST TECHNIQUE: Multidetector CT imaging of the chest was performed using the standard protocol during bolus administration of intravenous contrast. Multiplanar CT image reconstructions and MIPs were obtained to evaluate the vascular anatomy. RADIATION DOSE REDUCTION: This exam was performed according to the departmental dose-optimization program which includes automated exposure control, adjustment of the mA and/or kV according to patient size and/or use of iterative reconstruction technique. CONTRAST:   75mL OMNIPAQUE IOHEXOL 350 MG/ML SOLN COMPARISON:  Chest CT dated 01/06/2022. FINDINGS: Cardiovascular: Mild cardiomegaly. No pericardial effusion. There is coronary vascular calcification and postsurgical changes of CABG. Mild atherosclerotic calcification of the thoracic aorta. No aneurysmal dilatation. Right middle lobe segmental pulmonary artery embolism. No CT evidence of right heart straining. Mediastinum/Nodes: No hilar adenopathy. The esophagus is grossly unremarkable. No mediastinal fluid collection. Lungs/Pleura: Trace bilateral pleural effusions and bibasilar subpleural atelectasis. No pneumothorax. The central airways are patent. Upper Abdomen: No acute abnormality. Musculoskeletal: Degenerative changes of the spine. Median sternotomy wires. No acute osseous pathology. Review of the MIP images confirms the above findings. IMPRESSION: 1. Right middle lobe segmental pulmonary artery embolism.  No CT evidence of right heart straining. 2. Trace bilateral pleural effusions and bibasilar subpleural atelectasis. 3.  Aortic Atherosclerosis (ICD10-I70.0). These results were called by telephone at the time of interpretation on 10/29/2022 at 12:44 am to provider [facility] , who verbally acknowledged these results. Electronically Signed   By: Elgie Collard M.D.   On: 10/29/2022 00:46   DG Chest 2 View  Result Date: 10/28/2022 CLINICAL DATA:  Shortness of breath. EXAM: CHEST - 2 VIEW COMPARISON:  PA Lat 05/14/2022 FINDINGS: The cardiac size is normal. There are old CABG changes. Slight aortic tortuosity with mild atherosclerosis and stable mediastinum. The lungs are clear of infiltrates. A calcified granuloma is again noted in the left lower lung field. Thoracic spondylosis and mild dextroscoliosis. Three level lower cervical ACDF plating is again shown. IMPRESSION: No active cardiopulmonary disease. Old granulomatous disease. Post CABG chest. Electronically Signed   By: Almira Bar M.D.   On: 10/28/2022  21:12     Assessment and Plan:   Jacob Collier is a 67 y.o. male with a hx of CAD s/p DES to RCA in 01/2019 and then CABG x4 (LIMA to LAD, reverse SVG to PDA, reverse SVG to OM1, and reverse SVG to 1st Diag) in 01/2021, left pleural effusion following CABG s/p thoracentesis, post-op atrial fibrillation not on anticoagulation, paroxysmal SVT noted on monitor in 07/2019, hypertension, hyperlipidemia intolerant to statins, anxiety, prior prostate cancer, and polycythemia who is being seen 11/01/2022 for the evaluation of anticoagulation/antiplatelet therapy at the request of Dr. Idelle Leech.  Acute PE -- presented with shortness of breath, fever, chills and sweats. Underwent CT angio showing right middle lobe segmental PE. Initially placed on IV heparin, now transitioned to Eliquis PE dosing. No right heart strain on echo. Negative LE dopplers. Has been weaned to RA  CAD s/p 4v CABG '22 -- has done relatively well since surgery. Denies any chest pain. Has been maintained on DAPT with ASA/plavix since CABG -- now with the need for [facility], will need to drop one antiplatelet agent. Will stop plavix, continue ASA 81mg  daily  Would recommend stopping Plavix and continuing aspirin simply because the DOAC will be short-lived, and I do want her to be off of aspirin once the DOAC course is completed. ->  Would defer to his primary cardiologist about whether or not we would restart Plavix after completing his DOAC course. -- continue zetia, repatha  Hx of SVT Palpitations -- s/p recent ablation with Dr. Nelly Laurence, no recurrent episodes noted on telemetry It is possible that some onset second-degree AV block ablation related thrombus could be the nidus for his PE.  HTN -- controlled -- continue amlodipine   HLD -- statin intolerant -- continue Zetia, Repatha   Per Primary Fever GAD Hyponatremia   For questions or updates, please contact [facility] Please consult [email or web address] for contact info  under    Signed, Laverda Page, NP  11/01/2022 2:41 PM    ATTENDING ATTESTATION  I have seen, examined and evaluated the patient this afternoon in consultation, along with Laverda Page, NP.  After reviewing all the available data and chart, we discussed the patients laboratory, study & physical findings as well as symptoms in detail.  I agree with her findings, examination as well as impression recommendations as per our discussion.    Attending adjustments noted in italics.   Restenosis question was reference holding antiplatelet agents in the setting of starting DOAC for PE.  Manage the question is better semantics,  but the given the duration of DOAC will be short-lived, reasonable to continue aspirin and stop Plavix.  If he has significant bleeding and bruising with aspirin plus DOAC, we will simply this.  Discontinue aspirin and continue DOAC alone until DOAC therapy completed and ensure that he is placed back on aspirin when DOAC course is completed.   [facility] will sign off.   Medication Recommendations: Stop Plavix continue aspirin, continue Eliquis until Multaq blocker due to underlying Other recommendations (labs, testing, etc): no further testing Follow up as an outpatient: Will arrange follow-up as already scheduled post ablation.  Will also set up follow-up with general cardiology    Marykay Lex, MD, MS Bryan Lemma, M.D., M.S. Interventional Cardiologist  [facility]  Pager # [phone] Phone # [phone] [address] [place], [place] [address]

## 2022-11-01 NOTE — Progress Notes (Signed)
PROGRESS NOTE    Jacob Collier  ZOX:[id number] DOB: 07/19/1955 DOA: 10/28/2022 PCP: Blane Ohara, MD    Brief Narrative:  This 67 year old obese male with PMH significant for Essential hypertension, hyperlipidemia, paroxysmal SVT status post recent ablation presented in the ED for shortness of breath and found to have acute PE. He underwent ablation for paroxysmal SVT at [facility] on 8/15-8/16 and subsequently in the last few days developed shortness of breath, Subjective fever and  nonproductive cough.  Patient is admitted for further evaluation.  Assessment & Plan:   Principal Problem:   Acute pulmonary embolism (HCC) Active Problems:   Essential hypertension   Hyperlipidemia   Acute hypoxic respiratory failure (HCC)   Elevated troponin   GAD (generalized anxiety disorder)   Acute pulmonary embolism: Patient presented with progressive shortness of breath associated with subjective fever, cough and new oxygen requirement. Patient underwent ablation procedure for tachyarrhythmia recently.   CTA chest showed right middle lobe pulmonary embolism. No evidence of right heart strain. Continue IV heparin with the plan to transition to DOAC before discharge. Echo showed LVEF 50 to 55%, Left ventricle has normal function.  Right systolic function mildly reduced. Troponin slightly elevated could be due to demand ischemia Lower extremity venous duplex >  No evidence of DVT in the lower extremities. Patient successfully transitioned to Eliquis. Ambulatory home oxygen needs to be checked before discharge.  Acute hypoxic respiratory failure likely secondary to acute PE Patient successfully weaned down to room air. Chest x-ray mild cardiomegaly,  no focal infiltrate.  Fever: Likely reactive, Patient has no leukocytosis, had a fever 102.1 Chest x-ray no focal infiltrate. Blood cultures no growth so far. Patient had recurrent episodes where he reports flushing,  profuse sweating. This  could be related to acute pulmonary embolism, explained to the patient.  History of coronary artery disease: Patient reports having quadruple bypass surgery and been taking aspirin and Plavix PTA. Cardiology is consulted to decide whether to continue aspirin and Plavix or monotherapy since patient is now on Eliquis.  Hyponatremia: > Resolved. Could be due to dehydration.  Now resolved.  Acute kidney injury: > Resolved. Likely due to dehydration, avoid nephrotoxic medication Serum creatinine back to baseline.  Metabolic acidosis: Resolved.  Continue sodium bicarbonate.  Generalized anxiety disorder Continue clonazepam 0.5 mg twice daily  Essential hypertension: Blood pressure is well-controlled. Continue amlodipine.  Hyperlipidemia: Continue Zetia 10 mg daily  Obesity: Diet and exercise discussed in detail. Estimated body mass index is 32.69 kg/m as calculated from the following:   Height as of this encounter: 5\' 11"  (1.803 m).   Weight as of this encounter: 106.3 kg.    DVT prophylaxis: Heparin IV Code Status: Full code. Family Communication: No family at bed side. Disposition Plan:     Status is: Inpatient Remains inpatient appropriate because: Admitted for acute hypoxic respiratory failure secondary to acute pulmonary embolism requiring IV heparin.  Patient is started on Eliquis.    Consultants:  Cardiology  Procedures: CTA chest Antimicrobials:  Anti-infectives (From admission, onward)    None      Subjective: Patient was seen and examined at bedside.  Overnight events noted.   Patient reports doing better, he remains on room air. Last night he had an episode where he was profusely sweating with flushing,  temp was normal. Denies any chest pain,  shortness of breath or palpitations.  Objective: Vitals:   10/31/22 2259 10/31/22 2358 11/01/22 0500 11/01/22 0849  BP:    127/89  Pulse:    67  Resp:   20 17  Temp: 99.5 F (37.5 C) (!) 97.5 F (36.4  C) 98 F (36.7 C) (!) 96.9 F (36.1 C)  TempSrc: Oral Oral Axillary Oral  SpO2:    91%  Weight:   106.3 kg   Height:        Intake/Output Summary (Last 24 hours) at 11/01/2022 1121 Last data filed at 11/01/2022 0855 Gross per 24 hour  Intake 1251.86 ml  Output --  Net 1251.86 ml   Filed Weights   10/30/22 0301 10/31/22 0538 11/01/22 0500  Weight: 100.1 kg 99 kg 106.3 kg    Examination:  General exam: Appears calm and comfortable, not in any acute distress. Respiratory system: CTA bilaterally. Respiratory effort normal.  RR 14 Cardiovascular system: S1 & S2 heard, RRR. No JVD, murmurs, rubs, gallops or clicks. No pedal edema. Gastrointestinal system: Abdomen is soft, non distended, non tender. Normal bowel sounds heard. Central nervous system: Alert and oriented x 3. No focal neurological deficits. Extremities: Symmetric 5 x 5 power. Skin: No rashes, lesions or ulcers Psychiatry: Judgement and insight appear normal. Mood & affect appropriate.     Data Reviewed: I have personally reviewed following labs and imaging studies  CBC: Recent Labs  Lab 10/28/22 2021 10/29/22 0336 10/30/22 0703 10/31/22 0335 11/01/22 0359  WBC 5.3 5.8 4.4 3.6* 4.4  NEUTROABS  --  4.2 2.9 2.1  --   HGB 18.8* 18.1* 16.4 17.1* 16.2  HCT 56.7* 54.8* 49.6 51.8 48.5  MCV 85.9 88.8 89.7 84.6 83.9  PLT 228 176 174 185 185   Basic Metabolic Panel: Recent Labs  Lab 10/28/22 2021 10/29/22 0336 10/30/22 0703 10/31/22 0335 11/01/22 0923  NA 134* 134* 134* 133* 136  K 4.3 4.1 3.9 3.8 3.5  CL 101 101 107 102 107  CO2 18* 24 16* 22 20*  GLUCOSE 102* 125* 92 95 134*  BUN 11 11 12 10 10   CREATININE 1.35* 1.51* 1.25* 1.26* 1.21  CALCIUM 8.8* 8.3* 7.8* 8.3* 8.1*  MG  --  2.2 1.9 2.0  --   PHOS  --  3.2 2.8 3.1  --    GFR: Estimated Creatinine Clearance: 72.5 mL/min (by C-G formula based on SCr of 1.21 mg/dL). Liver Function Tests: Recent Labs  Lab 10/29/22 0011 10/29/22 0336  10/30/22 0703 10/31/22 0335  AST 29 30 31  42*  ALT 23 22 24 29   ALKPHOS 63 60 52 54  BILITOT 0.9 0.4 0.7 0.8  PROT 6.2* 5.8* 5.6* 5.9*  ALBUMIN 3.1* 3.0* 2.8* 3.0*   No results for input(s): "LIPASE", "AMYLASE" in the last 168 hours. No results for input(s): "AMMONIA" in the last 168 hours. Coagulation Profile: Recent Labs  Lab 10/28/22 2227  INR 1.1   Cardiac Enzymes: Recent Labs  Lab 10/29/22 0011  CKTOTAL 363   BNP (last 3 results) No results for input(s): "PROBNP" in the last 8760 hours. HbA1C: No results for input(s): "HGBA1C" in the last 72 hours. CBG: Recent Labs  Lab 11/01/22 0501  GLUCAP 117*   Lipid Profile: No results for input(s): "CHOL", "HDL", "LDLCALC", "TRIG", "CHOLHDL", "LDLDIRECT" in the last 72 hours. Thyroid Function Tests: No results for input(s): "TSH", "T4TOTAL", "FREET4", "T3FREE", "THYROIDAB" in the last 72 hours. Anemia Panel: No results for input(s): "VITAMINB12", "FOLATE", "FERRITIN", "TIBC", "IRON", "RETICCTPCT" in the last 72 hours. Sepsis Labs: No results for input(s): "PROCALCITON", "LATICACIDVEN" in the last 168 hours.  Recent Results (from the past 240 hour(s))  Culture, blood (Routine X 2) w Reflex to ID Panel     Status: None (Preliminary result)   Collection Time: 10/29/22 12:11 AM   Specimen: BLOOD  Result Value Ref Range Status   Specimen Description BLOOD BLOOD LEFT ARM  Final   Special Requests   Final    BOTTLES DRAWN AEROBIC AND ANAEROBIC Blood Culture adequate volume   Culture   Final    NO GROWTH 2 DAYS Performed at [facility] Lab, [address] [place], [place] [address]    Report Status PENDING  Incomplete  Culture, blood (Routine X 2) w Reflex to ID Panel     Status: None (Preliminary result)   Collection Time: 10/29/22 12:11 AM   Specimen: BLOOD  Result Value Ref Range Status   Specimen Description BLOOD BLOOD RIGHT HAND  Final   Special Requests   Final    BOTTLES DRAWN AEROBIC AND ANAEROBIC Blood  Culture adequate volume   Culture   Final    NO GROWTH 2 DAYS Performed at [facility], [address], [place], [place] [address]    Report Status PENDING  Incomplete  SARS Coronavirus 2 by RT PCR (hospital order, performed in [facility] hospital lab) *cepheid single result test* Anterior Nasal Swab     Status: None   Collection Time: 10/29/22  1:15 AM   Specimen: Anterior Nasal Swab  Result Value Ref Range Status   SARS Coronavirus 2 by RT PCR NEGATIVE NEGATIVE Final    Comment: Performed at [facility] Lab, [address], [place], [place] [address]  MRSA Next Gen by PCR, Nasal     Status: None   Collection Time: 10/29/22  2:37 AM   Specimen: Nasal Mucosa; Nasal Swab  Result Value Ref Range Status   MRSA by PCR Next Gen NOT DETECTED NOT DETECTED Final    Comment: (NOTE) The GeneXpert MRSA Assay (FDA approved for NASAL specimens only), is one component of a comprehensive MRSA colonization surveillance program. It is not intended to diagnose MRSA infection nor to guide or monitor treatment for MRSA infections. Test performance is not FDA approved in patients less than 75 years old. Performed at [facility], [address], [place], [place] [address]   Culture, blood (Routine X 2) w Reflex to ID Panel     Status: None (Preliminary result)   Collection Time: 10/29/22  8:27 PM   Specimen: BLOOD  Result Value Ref Range Status   Specimen Description BLOOD BLOOD RIGHT HAND  Final   Special Requests   Final    BOTTLES DRAWN AEROBIC AND ANAEROBIC Blood Culture adequate volume   Culture   Final    NO GROWTH 2 DAYS Performed at [facility], [address], [place], [place] [address]    Report Status PENDING  Incomplete  Culture, blood (Routine X 2) w Reflex to ID Panel     Status: None (Preliminary result)   Collection Time: 10/29/22  8:29 PM   Specimen: BLOOD  Result Value Ref Range Status   Specimen Description BLOOD BLOOD RIGHT ARM  Final   Special  Requests   Final    BOTTLES DRAWN AEROBIC AND ANAEROBIC Blood Culture adequate volume   Culture   Final    NO GROWTH 2 DAYS Performed at [facility] Lab, [address] [place], [place] [address]    Report Status PENDING  Incomplete    Radiology Studies: No results found.  Scheduled  Meds:  apixaban  10 mg Oral BID   Followed by   Melene Muller ON 11/08/2022] apixaban  5 mg Oral BID   aspirin EC  81 mg Oral Daily   clopidogrel  75 mg Oral Daily   ezetimibe  10 mg Oral Daily   pneumococcal 20-valent conjugate vaccine  0.5 mL Intramuscular Tomorrow-1000   sodium bicarbonate  1,300 mg Oral BID   Continuous Infusions:     LOS: 3 days    Time spent: 35 mins    Willeen Niece, MD [facility]   If 7PM-7AM, please contact night-coverage

## 2022-11-01 NOTE — Telephone Encounter (Signed)
Pharmacy Patient Advocate Encounter  Received notification from [facility] that Prior Authorization for REPATHA has been APPROVED from 11/01/22 to 11/01/23. Ran test claim, Copay is $11.20. This test claim was processed through [facility]- copay amounts may vary at other pharmacies due to pharmacy/plan contracts, or as the patient moves through the different stages of their insurance plan.

## 2022-11-01 NOTE — Progress Notes (Signed)
ANTICOAGULATION CONSULT NOTE   Pharmacy Consult for Heparin>>apixaban Indication: pulmonary embolus  Allergies  Allergen Reactions   Ranolazine Other (See Comments)    Chest discomfort/Reflux   Amoxicillin Hives   Atorvastatin Other (See Comments)    myalgias  Other reaction(s): Myalgias (intolerance)   Pravastatin Other (See Comments)    myalgias  Other reaction(s): Myalgias (intolerance)   Rosuvastatin Other (See Comments)    myalgias  Other reaction(s): Myalgias (intolerance)   Statins Other (See Comments)    myalgias    Patient Measurements: Height: 5\' 11"  (180.3 cm) Weight: 106.3 kg (234 lb 5.6 oz) IBW/kg (Calculated) : 75.3 Heparin Dosing Weight: 95 kg  Vital Signs: Temp: 98 F (36.7 C) (08/27 0500) Temp Source: Axillary (08/27 0500)  Labs: Recent Labs    10/30/22 0703 10/31/22 0335 11/01/22 0359  HGB 16.4 17.1* 16.2  HCT 49.6 51.8 48.5  PLT 174 185 185  HEPARINUNFRC 0.57 0.56 0.52  CREATININE 1.25* 1.26*  --     Estimated Creatinine Clearance: 69.6 mL/min (A) (by C-G formula based on SCr of 1.26 mg/dL (H)).  Assessment: 67 y.o. male with PE started on heparin. Pharmacy consulted to transition to apixaban. Patient has been therapeutic on heparin x 3 days. Co-pay for apixaban $11.20.   Goal of Therapy:  Heparin level 0.3-0.7 units/ml Monitor platelets by anticoagulation protocol: Yes   Plan:  STOP heparin infusion  START apixaban 10 mg BID x7 days, then 5 mg BID- administer first dose at the same time heparin infusion is stopped   Jani Gravel, PharmD Clinical Pharmacist  11/01/2022 8:31 AM

## 2022-11-02 ENCOUNTER — Telehealth: Payer: Self-pay

## 2022-11-02 ENCOUNTER — Other Ambulatory Visit (HOSPITAL_COMMUNITY): Payer: Self-pay

## 2022-11-02 DIAGNOSIS — I2699 Other pulmonary embolism without acute cor pulmonale: Secondary | ICD-10-CM | POA: Diagnosis not present

## 2022-11-02 LAB — CBC
HCT: 51.5 % (ref 39.0–52.0)
Hemoglobin: 17.3 g/dL — ABNORMAL HIGH (ref 13.0–17.0)
MCH: 28.4 pg (ref 26.0–34.0)
MCHC: 33.6 g/dL (ref 30.0–36.0)
MCV: 84.4 fL (ref 80.0–100.0)
Platelets: 204 10*3/uL (ref 150–400)
RBC: 6.1 MIL/uL — ABNORMAL HIGH (ref 4.22–5.81)
RDW: 14 % (ref 11.5–15.5)
WBC: 5.3 10*3/uL (ref 4.0–10.5)
nRBC: 0 % (ref 0.0–0.2)

## 2022-11-02 LAB — BASIC METABOLIC PANEL
Anion gap: 9 (ref 5–15)
BUN: 12 mg/dL (ref 8–23)
CO2: 20 mmol/L — ABNORMAL LOW (ref 22–32)
Calcium: 8.4 mg/dL — ABNORMAL LOW (ref 8.9–10.3)
Chloride: 106 mmol/L (ref 98–111)
Creatinine, Ser: 1.1 mg/dL (ref 0.61–1.24)
GFR, Estimated: >60 mL/min (ref >60)
Glucose, Bld: 113 mg/dL — ABNORMAL HIGH (ref 70–99)
Potassium: 3.7 mmol/L (ref 3.5–5.1)
Sodium: 135 mmol/L (ref 135–145)

## 2022-11-02 LAB — MAGNESIUM: Magnesium: 2.2 mg/dL (ref 1.7–2.4)

## 2022-11-02 LAB — PHOSPHORUS: Phosphorus: 3.6 mg/dL (ref 2.5–4.6)

## 2022-11-02 MED ORDER — ACETAMINOPHEN 325 MG PO TABS: 650.0000 mg | ORAL_TABLET | Freq: Four times a day (QID) | ORAL | Status: DC | PRN

## 2022-11-02 MED ORDER — APIXABAN 5 MG PO TABS
ORAL_TABLET | ORAL | 0 refills | Status: DC
  Filled 2022-11-02: qty 72, 29d supply, fill #0

## 2022-11-02 MED ORDER — POTASSIUM CHLORIDE CRYS ER 20 MEQ PO TBCR
40.0000 meq | EXTENDED_RELEASE_TABLET | Freq: Once | ORAL | Status: AC
  Administered 2022-11-02: 40 meq via ORAL
  Filled 2022-11-02: qty 2

## 2022-11-02 MED ORDER — APIXABAN 5 MG PO TABS: 5.0000 mg | ORAL_TABLET | Freq: Two times a day (BID) | ORAL | 1 refills | Status: DC

## 2022-11-02 NOTE — Progress Notes (Addendum)
Subjective:  Patient ID: Jacob Collier, male    DOB: [birth year]  Age: 67 y.o. MRN: [id number]  Chief Complaint  Patient presents with   Hospitalization Follow-up    HPI  Patient present today for a hospital follow up. Patient was admitted to the hospital on 10/28/22 for pulmonary thromboembolism and discharged on 11/02/22.  Had questions about his Alfonso Patten and stating that it has started to not be as affective. Would like to change it if possible. Admits to having more issues recently of staying asleep. Admits to having been on a medicine before that he would take two at night before bed and it would help him sleep great till his alarm in the morning. He believes it was another Benzo but can't remember the name of it.   Filled out FMLA paperwork      11/03/2022   10:40 AM 07/27/2022    3:21 PM 11/20/2020   11:03 AM 10/26/2020    1:52 PM 05/19/2020    1:40 PM  Depression screen PHQ 2/9  Decreased Interest 0 0 0 0 0  Down, Depressed, Hopeless 0 0 0 0 0  PHQ - 2 Score 0 0 0 0 0  Altered sleeping 0 0     Tired, decreased energy 1 1     Change in appetite 0 0     Feeling bad or failure about yourself  0 0     Trouble concentrating 0 0     Moving slowly or fidgety/restless 0 0     Suicidal thoughts 0 0     PHQ-9 Score 1 1     Difficult doing work/chores Not difficult at all Not difficult at all           11/03/2022   10:40 AM  Fall Risk   Falls in the past year? 0  Number falls in past yr: 0  Injury with Fall? 0  Follow up Falls evaluation completed    Patient Care Team: Blane Ohara, MD as PCP - General (Family Medicine) Lennette Bihari, MD as PCP - Cardiology (Cardiology) Mealor, Roberts Gaudy, MD as PCP - Electrophysiology (Cardiology) Crista Elliot, MD as Consulting Physician (Urology) Smitty Knudsen as Physician Assistant (Cardiology)   Review of Systems  Constitutional:  Negative for chills, fatigue and fever.  HENT:  Negative for congestion, ear pain and  sore throat.   Respiratory:  Negative for cough and shortness of breath.   Cardiovascular:  Negative for chest pain and palpitations.  Gastrointestinal:  Negative for abdominal pain, constipation, diarrhea, nausea and vomiting.  Genitourinary:  Negative for difficulty urinating and dysuria.  Musculoskeletal:  Negative for arthralgias, back pain and myalgias.  Skin:  Negative for rash.  Neurological:  Negative for dizziness and headaches.  Psychiatric/Behavioral:  Negative for dysphoric mood.     Current Outpatient Medications on File Prior to Visit  Medication Sig Dispense Refill   acetaminophen (TYLENOL) 500 MG tablet Take 1,000 mg by mouth every 8 (eight) hours as needed for mild pain.     amLODipine (NORVASC) 5 MG tablet Take 1 tablet (5 mg) by mouth every morning & take 0.5 tablet (2.5 mg) by mouth at night. 135 tablet 1   apixaban (ELIQUIS) 5 MG TABS tablet *Starting evening of 8/28, take 2 tablets (10 mg total) by mouth 2 (two) times daily for 6 days, THEN 1 tablet (5 mg total) 2 (two) times daily starting 11/08/22 72 tablet 0   aspirin EC 81  MG tablet Take 81 mg by mouth daily. Swallow whole.     benzonatate (TESSALON) 100 MG capsule Take by mouth.     clonazePAM (KLONOPIN) 1 MG tablet TAKE 1 TABLET BY MOUTH TWICE A DAY AS NEEDED FOR ANXIETY (Patient taking differently: Take 1 mg by mouth 2 (two) times daily. TAKE 1 TABLET BY MOUTH TWICE A DAY AS NEEDED FOR ANXIETY) 60 tablet 3   cyclobenzaprine (FLEXERIL) 10 MG tablet Take 10 mg by mouth 3 (three) times daily as needed for muscle spasms.     Evolocumab (REPATHA SURECLICK) 140 MG/ML SOAJ INJECT 140 MG INTO THE SKIN EVERY 14 (FOURTEEN) DAYS. 6 mL 3   ezetimibe (ZETIA) 10 MG tablet TAKE 1 TABLET BY MOUTH EVERY DAY 90 tablet 3   Omega-3 Fatty Acids (OMEGA 3 PO) Take 500 mg by mouth every morning.     Phenylephrine HCl (AFRIN ALLERGY NA) Place 2 sprays into the nose 2 (two) times daily.     No current facility-administered medications on  file prior to visit.   Past Medical History:  Diagnosis Date   Anxiety    Arthritis    Coronary artery disease    a. S/p DES to RCA in 01/2019 b. s/p CABG x4 (LIMA-LAD, reverse SVG-PDA, reverse SVG-OM1, reverse SVG-1st Diag) in 01/2021   Diverticulosis    Dyspnea    Hard of hearing    Hyperlipidemia    Hypertension    hx of elevation on lyrica, off med and now normal    Paroxysmal SVT (supraventricular tachycardia)    Post-op atrial fibrillation    Prostate cancer (HCC)    Wears glasses    Past Surgical History:  Procedure Laterality Date   ANTERIOR CERVICAL DECOMP/DISCECTOMY FUSION N/A 09/03/2021   Procedure: CERVICAL FIVE-SIX, CERVICAL SIX-SEVEN ANTERIOR CERVICAL DECOMPRESSION/DISCECTOMY FUSION;  Surgeon: Julio Sicks, MD;  Location: [facility] OR;  Service: Neurosurgery;  Laterality: N/A;   CARDIAC CATHETERIZATION     with 2 stents    COLECTOMY  05/2018   COLONOSCOPY     CORONARY ARTERY BYPASS GRAFT N/A 01/19/2021   Procedure: CORONARY ARTERY BYPASS GRAFTING (CABG), ON PUMP TIMES FOUR, USING LEFT INTERNAL MAMMARY ARTERY AND ENDOSOCPICALLY HARVESTED RIGHT GREATER SAPHENOUS VEIN;  Surgeon: Corliss Skains, MD;  Location: [facility] OR;  Service: Open Heart Surgery;  Laterality: N/A;   CYSTOSCOPY WITH URETHRAL DILATATION N/A 01/14/2021   Procedure: CYSTOSCOPY WITH BALLOON  DILATATION;  Surgeon: Alfredo Martinez, MD;  Location: [facility] ORS;  Service: Urology;  Laterality: N/A;   EYE SURGERY  02/2020   bilaterl cataracts   IR THORACENTESIS ASP PLEURAL SPACE W/IMG GUIDE  02/02/2021   LEFT HEART CATH AND CORONARY ANGIOGRAPHY N/A 12/24/2020   Procedure: LEFT HEART CATH AND CORONARY ANGIOGRAPHY;  Surgeon: Swaziland, Peter M, MD;  Location: [facility] INVASIVE CV LAB;  Service: Cardiovascular;  Laterality: N/A;   PROSTATECTOMY     SVT ABLATION N/A 10/20/2022   Procedure: SVT ABLATION;  Surgeon: Maurice Small, MD;  Location: [facility] INVASIVE CV LAB;  Service: Cardiovascular;  Laterality: N/A;   TEE WITHOUT  CARDIOVERSION N/A 01/19/2021   Procedure: TRANSESOPHAGEAL ECHOCARDIOGRAM (TEE);  Surgeon: Corliss Skains, MD;  Location[facility] OR;  Service: Open Heart Surgery;  Laterality: N/A;   TOTAL HIP ARTHROPLASTY Left 11/10/2015   Procedure: LEFT TOTAL HIP ARTHROPLASTY ANTERIOR APPROACH;  Surgeon: Kathryne Hitch, MD;  Location: [facility] OR;  Service: Orthopedics;  Laterality: Left;    Family History  Problem Relation Age of Onset   Alzheimer's disease  Mother    Stroke Father    Diabetes Sister    Heart attack Maternal Grandmother    Cancer Maternal Uncle        prostate   Prostate cancer Maternal Uncle    Cancer Maternal Uncle        prostate   Prostate cancer Maternal Uncle    Colon polyps Brother    Colon cancer Neg Hx    Esophageal cancer Neg Hx    Rectal cancer Neg Hx    Stomach cancer Neg Hx    Social History   Socioeconomic History   Marital status: Married    Spouse name: Not on file   Number of children: 0   Years of education: Not on file   Highest education level: Not on file  Occupational History   Occupation: Employed at [employer]  Tobacco Use   Smoking status: Never   Smokeless tobacco: Never  Vaping Use   Vaping status: Never Used  Substance and Sexual Activity   Alcohol use: Not Currently    Alcohol/week: 0.0 standard drinks of alcohol    Comment: rarely   Drug use: No   Sexual activity: Yes    Partners: Female    Birth control/protection: None  Other Topics Concern   Not on file  Social History Narrative   Not on file   Social Determinants of Health   Financial Resource Strain: Low Risk  (08/04/2022)   Overall Financial Resource Strain (CARDIA)    Difficulty of Paying Living Expenses: Not hard at all  Food Insecurity: No Food Insecurity (10/29/2022)   Hunger Vital Sign    Worried About Running Out of Food in the Last Year: Never true    Ran Out of Food in the Last Year: Never true  Transportation Needs: No Transportation Needs (10/29/2022)    PRAPARE - Administrator, Civil Service (Medical): No    Lack of Transportation (Non-Medical): No  Physical Activity: Sufficiently Active (08/04/2022)   Exercise Vital Sign    Days of Exercise per Week: 5 days    Minutes of Exercise per Session: 60 min  Stress: No Stress Concern Present (08/04/2022)   [employer] of Occupational Health - Occupational Stress Questionnaire    Feeling of Stress : Not at all  Social Connections: Moderately Isolated (08/04/2022)   Social Connection and Isolation Panel [NHANES]    Frequency of Communication with Friends and Family: More than three times a week    Frequency of Social Gatherings with Friends and Family: More than three times a week    Attends Religious Services: Never    Database administrator or Organizations: No    Attends Engineer, structural: Never    Marital Status: Married    Objective:  BP (!) 124/58 (BP Location: Left Arm, Patient Position: Sitting, Cuff Size: Large)   Pulse 71   Temp (!) 97.3 F (36.3 C) (Temporal)   Resp 16   Ht 5\' 11"  (1.803 m)   Wt 217 lb 9.6 oz (98.7 kg)   SpO2 93%   BMI 30.35 kg/m      11/03/2022   10:45 AM 11/02/2022   11:15 AM 11/02/2022    7:24 AM  BP/Weight  Systolic BP 124 132 148  Diastolic BP 58 80 75  Wt. (Lbs) 217.6    BMI 30.35 kg/m2      Physical Exam Vitals reviewed.  Constitutional:      Appearance: Normal appearance.  Cardiovascular:  Rate and Rhythm: Normal rate and regular rhythm.     Heart sounds: Normal heart sounds.  Pulmonary:     Effort: Pulmonary effort is normal.     Breath sounds: Normal breath sounds.  Abdominal:     General: Bowel sounds are normal.     Palpations: Abdomen is soft.     Tenderness: There is no abdominal tenderness.  Neurological:     Mental Status: He is alert and oriented to person, place, and time.  Psychiatric:        Mood and Affect: Mood normal.        Behavior: Behavior normal.     Diabetic Foot Exam - Simple    No data filed      Lab Results  Component Value Date   WBC 5.3 11/02/2022   HGB 17.3 (H) 11/02/2022   HCT 51.5 11/02/2022   PLT 204 11/02/2022   GLUCOSE 113 (H) 11/02/2022   CHOL 84 (L) 07/27/2022   TRIG 132 07/27/2022   HDL 39 (L) 07/27/2022   LDLCALC 22 07/27/2022   ALT 29 10/31/2022   AST 42 (H) 10/31/2022   NA 135 11/02/2022   K 3.7 11/02/2022   CL 106 11/02/2022   CREATININE 1.10 11/02/2022   BUN 12 11/02/2022   CO2 20 (L) 11/02/2022   TSH 1.570 04/04/2022   INR 1.1 10/28/2022   HGBA1C 5.8 (H) 03/04/2021      Assessment & Plan:    Hospital discharge follow-up Assessment & Plan: Reports no issues or complaints since coming out of hospital Continue to monitor signs and symptoms Has chronic follow up on September 3rd with Dr. Faylene Kurtz.    Other acute pulmonary embolism without acute cor pulmonale (HCC) Assessment & Plan: Controlled Continue on Eliquis 10mg  as directed Will have chronic follow up on Wednesday Discussed and educated on severe bleeding precautions.    Primary insomnia Assessment & Plan: Uncontrolled Patient will follow up with Dr. Faylene Kurtz about changing due to decreased efficacy.   Orders: -     Eszopiclone; Take 1 tablet (3 mg total) by mouth daily as needed (sleep).  Dispense: 30 tablet; Refill: 0     Meds ordered this encounter  Medications   Eszopiclone 3 MG TABS    Sig: Take 1 tablet (3 mg total) by mouth daily as needed (sleep).    Dispense:  30 tablet    Refill:  0    Not to exceed 3 additional fills before 02/17/2022    No orders of the defined types were placed in this encounter.    Follow-up: No follow-ups on file.   I,Jacqua L Marsh,acting as a scribe for [employer], PA.,have documented all relevant documentation on the behalf of Langley Gauss, PA,as directed by  Langley Gauss, PA while in the presence of Langley Gauss, [name].   An After Visit Summary was printed and given to the patient.  Langley Gauss, [name] [facility] [phone]

## 2022-11-02 NOTE — Telephone Encounter (Signed)
Patient wife called and stated that he will need paperwork filled out for his job, he is in the hospital and was told that his PCP would have to fill out FMLA paperwork.  Made wife aware that she can drop paperwork off for provider to have a look at it.

## 2022-11-02 NOTE — Discharge Summary (Signed)
DISCHARGE SUMMARY  Jacob Collier  MR#: [id number]  DOB:Jul 03, 1955  Date of Admission: 10/28/2022 Date of Discharge: 11/02/2022  Attending Physician:Thi Sisemore Silvestre Gunner, MD  Patient's PCP:Cox, Fritzi Mandes, MD  Disposition: D/C home   Follow-up Appts:  Follow-up Information     Cox, Kirsten, MD Follow up in 1 week(s).   Specialty: Family Medicine Contact information: [address] [place] [address] [phone]                 Tests Needing Follow-up: -routine f/u of CBC advised in patient newly started on a blood thinner  -re-evaluate patient status after 3 months of anticoagulation, as d/c of DOAC should be considered at that time   Discharge Diagnoses: Acute pulmonary embolism Acute hypoxic respiratory failure Fever of unknown origin CAD status post CABG times 06/24/2020 Hyponatremia Acute renal injury Generalized anxiety disorder HTN HLD Obesity - Body mass index is 32.69 kg/m.  Initial presentation: 67 year old with a history of obesity, HTN, HLD, and paroxysmal SVT status post ablation 10/21/2022 who presented to the ER 10/28/2022 with shortness of breath and was diagnosed with acute PE.   Hospital Course:  Acute pulmonary embolism Noted on CTa chest and located in right middle lobe -no evidence of significant right heart strain -treated initially with IV heparin and then transitioned to DOAC -TTE confirmed preserved LVEF and only mildly reduced RV systolic function -lower extremity venous duplex negative for DVT bilaterally - Cardiology opined "It is possible that some onset second-degree AV block ablation related thrombus could be the nidus for his PE." - at this time plan is to complete 3 months of anticoagulation    Acute hypoxic respiratory failure Present at time of admission -patient was able to be weaned to room air prior to discharge -felt to be due to acute PE -no evidence of CHF on exam   Fever of unknown origin Likely reactive  in setting of acute PE -Tmax of 102.1 during this hospitalization -blood cultures unrevealing -CXR with no focal infiltrate  -afebrile for >24hrs prior to d/c    CAD status post CABG times 06/24/2020 Was on aspirin and Plavix prior to admission -given new need for Eliquis Plavix has been discontinued - to continue ASA unless bleeding difficulty encountered - primary Cardiologist to determine if Plavix to be resumed at such time that patient stops DOAC   Hyponatremia Likely simply due to volume depletion - now resolved   Acute renal injury Felt to be simply prerenal/due to dehydration -resolved with volume resuscitation   Generalized anxiety disorder Continue usual clonazepam dose w/ good efffect    HTN BP well-controlled   HLD Continue Zetia   Obesity - Body mass index is 32.69 kg/m. She has been counseled on the need to lose weight in a healthy fashion    Allergies as of 11/02/2022       Reactions   Ranolazine Other (See Comments)   Chest discomfort/Reflux   Amoxicillin Hives   Atorvastatin Other (See Comments)   myalgias Other reaction(s): Myalgias (intolerance)   Pravastatin Other (See Comments)   myalgias Other reaction(s): Myalgias (intolerance)   Rosuvastatin Other (See Comments)   myalgias Other reaction(s): Myalgias (intolerance)   Statins Other (See Comments)   myalgias        Medication List     STOP taking these medications    clopidogrel 75 MG tablet Commonly known as: PLAVIX       TAKE these medications    acetaminophen 500 MG  tablet Commonly known as: TYLENOL Take 1,000 mg by mouth every 8 (eight) hours as needed for mild pain.   AFRIN ALLERGY NA Place 2 sprays into the nose 2 (two) times daily.   amLODipine 5 MG tablet Commonly known as: NORVASC Take 1 tablet (5 mg) by mouth every morning & take 0.5 tablet (2.5 mg) by mouth at night.   apixaban 5 MG Tabs tablet Commonly known as: ELIQUIS Take 2 tablets (10 mg total) by mouth 2 (two)  times daily for 7 days, THEN 1 tablet (5 mg total) 2 (two) times daily. Start taking on: November 02, 2022   aspirin EC 81 MG tablet Take 81 mg by mouth daily. Swallow whole.   benzonatate 100 MG capsule Commonly known as: TESSALON Take by mouth.   clonazePAM 1 MG tablet Commonly known as: KLONOPIN TAKE 1 TABLET BY MOUTH TWICE A DAY AS NEEDED FOR ANXIETY What changed: See the new instructions.   cyclobenzaprine 10 MG tablet Commonly known as: FLEXERIL Take 10 mg by mouth 3 (three) times daily as needed for muscle spasms.   Eszopiclone 3 MG Tabs TAKE 1 TABLET BY MOUTH EVERYDAY AT BEDTIME What changed:  how much to take how to take this when to take this reasons to take this additional instructions   ezetimibe 10 MG tablet Commonly known as: ZETIA TAKE 1 TABLET BY MOUTH EVERY DAY   OMEGA 3 PO Take 500 mg by mouth every morning.   Repatha SureClick 140 MG/ML Soaj Generic drug: Evolocumab INJECT 140 MG INTO THE SKIN EVERY 14 (FOURTEEN) DAYS.        Day of Discharge BP 132/80 (BP Location: Right Arm)   Pulse 61   Temp 97.7 F (36.5 C) (Oral)   Resp 20   Ht 5\' 11"  (1.803 m)   Wt 106.3 kg   SpO2 94%   BMI 32.69 kg/m   Physical Exam: General: No acute respiratory distress Lungs: Clear to auscultation bilaterally without wheezes or crackles Cardiovascular: Regular rate and rhythm without murmur gallop or rub normal S1 and S2 Abdomen: Nontender, nondistended, soft, bowel sounds positive, no rebound, no ascites, no appreciable mass Extremities: No significant cyanosis, clubbing, or edema bilateral lower extremities  Basic Metabolic Panel: Recent Labs  Lab 10/29/22 0336 10/30/22 0703 10/31/22 0335 11/01/22 0923 11/02/22 0333  NA 134* 134* 133* 136 135  K 4.1 3.9 3.8 3.5 3.7  CL 101 107 102 107 106  CO2 24 16* 22 20* 20*  GLUCOSE 125* 92 95 134* 113*  BUN 11 12 10 10 12   CREATININE 1.51* 1.25* 1.26* 1.21 1.10  CALCIUM 8.3* 7.8* 8.3* 8.1* 8.4*  MG 2.2 1.9  2.0  --  2.2  PHOS 3.2 2.8 3.1  --  3.6    CBC: Recent Labs  Lab 10/29/22 0336 10/30/22 0703 10/31/22 0335 11/01/22 0359 11/02/22 0333  WBC 5.8 4.4 3.6* 4.4 5.3  NEUTROABS 4.2 2.9 2.1  --   --   HGB 18.1* 16.4 17.1* 16.2 17.3*  HCT 54.8* 49.6 51.8 48.5 51.5  MCV 88.8 89.7 84.6 83.9 84.4  PLT 176 174 185 185 204    Time spent in discharge (includes decision making & examination of pt): 35 minutes  11/02/2022, 12:37 PM   Lonia Blood, MD [facility] Office  [phone]

## 2022-11-02 NOTE — Plan of Care (Signed)
  Problem: Education: Goal: Understanding of disease, treatment, and recovery process will improve Outcome: Adequate for Discharge   Problem: Activity: Goal: Ability to return to baseline activity level will improve Outcome: Adequate for Discharge   Problem: Cardiac: Goal: Ability to maintain adequate cardiovascular perfusion will improve Outcome: Adequate for Discharge Goal: Vascular access site(s) Level 0-1 will be maintained Outcome: Adequate for Discharge   Problem: Health Behavior/ Discharge Planning: Goal: Ability to safely manage health related needs after discharge Outcome: Adequate for Discharge   Problem: Education: Goal: Knowledge of General Education information will improve Description: Including pain rating scale, medication(s)/side effects and non-pharmacologic comfort measures Outcome: Adequate for Discharge   Problem: Health Behavior/Discharge Planning: Goal: Ability to manage health-related needs will improve Outcome: Adequate for Discharge   Problem: Clinical Measurements: Goal: Ability to maintain clinical measurements within normal limits will improve Outcome: Adequate for Discharge Goal: Will remain free from infection Outcome: Adequate for Discharge Goal: Diagnostic test results will improve Outcome: Adequate for Discharge Goal: Respiratory complications will improve Outcome: Adequate for Discharge Goal: Cardiovascular complication will be avoided Outcome: Adequate for Discharge   Problem: Activity: Goal: Risk for activity intolerance will decrease Outcome: Adequate for Discharge   Problem: Nutrition: Goal: Adequate nutrition will be maintained Outcome: Adequate for Discharge   Problem: Coping: Goal: Level of anxiety will decrease Outcome: Adequate for Discharge   Problem: Elimination: Goal: Will not experience complications related to bowel motility Outcome: Adequate for Discharge Goal: Will not experience complications related to urinary  retention Outcome: Adequate for Discharge   Problem: Pain Managment: Goal: General experience of comfort will improve Outcome: Adequate for Discharge   Problem: Safety: Goal: Ability to remain free from injury will improve Outcome: Adequate for Discharge   Problem: Skin Integrity: Goal: Risk for impaired skin integrity will decrease Outcome: Adequate for Discharge

## 2022-11-02 NOTE — Telephone Encounter (Signed)
Dr. Sedalia Muta,  When and where can we schedule this patient?  Patient notified that we do not have an opening within the timeframe that he is requesting to be seen and will follow-up with the provider for further advisement.  Hospital:  [facility] Admitted: 10/28/22  Discharged: 11/02/22   The patient is wanting to be seen before 11/09/2022 so that he can have his FMLA paper work filled out so that he can go back to work on 11/09/2022.  Please advise.

## 2022-11-02 NOTE — Progress Notes (Signed)
Mobility Specialist Progress Note:   11/02/22 1021  Mobility  Activity Ambulated with assistance in hallway  Level of Assistance Contact guard assist, steadying assist  Assistive Device None  Distance Ambulated (ft) 350 ft  Activity Response Tolerated well  Mobility Referral Yes  $Mobility charge 1 Mobility  Mobility Specialist Start Time (ACUTE ONLY) 0920  Mobility Specialist Stop Time (ACUTE ONLY) 0930  Mobility Specialist Time Calculation (min) (ACUTE ONLY) 10 min    Pt received in bed, agreeable to mobility. Denied any feelings of pain during ambulation, asymptomatic throughout. Pt returned to bed with call bell in reach and all needs met.   Leory Plowman  Mobility Specialist Please contact via [employer] office at [phone]

## 2022-11-02 NOTE — Plan of Care (Signed)
  Problem: Education: Goal: Understanding of disease, treatment, and recovery process will improve Outcome: Progressing   Problem: Activity: Goal: Ability to return to baseline activity level will improve Outcome: Progressing   Problem: Cardiac: Goal: Ability to maintain adequate cardiovascular perfusion will improve Outcome: Progressing Goal: Vascular access site(s) Level 0-1 will be maintained Outcome: Progressing   Problem: Health Behavior/ Discharge Planning: Goal: Ability to safely manage health related needs after discharge Outcome: Progressing   Problem: Education: Goal: Knowledge of General Education information will improve Description: Including pain rating scale, medication(s)/side effects and non-pharmacologic comfort measures Outcome: Progressing   Problem: Health Behavior/Discharge Planning: Goal: Ability to manage health-related needs will improve Outcome: Progressing   Problem: Clinical Measurements: Goal: Ability to maintain clinical measurements within normal limits will improve Outcome: Progressing Goal: Will remain free from infection Outcome: Progressing Goal: Diagnostic test results will improve Outcome: Progressing Goal: Respiratory complications will improve Outcome: Progressing Goal: Cardiovascular complication will be avoided Outcome: Progressing   Problem: Activity: Goal: Risk for activity intolerance will decrease Outcome: Progressing   Problem: Nutrition: Goal: Adequate nutrition will be maintained Outcome: Progressing   Problem: Coping: Goal: Level of anxiety will decrease Outcome: Progressing   Problem: Elimination: Goal: Will not experience complications related to bowel motility Outcome: Progressing Goal: Will not experience complications related to urinary retention Outcome: Progressing   Problem: Pain Managment: Goal: General experience of comfort will improve Outcome: Progressing   Problem: Safety: Goal: Ability to  remain free from injury will improve Outcome: Progressing   Problem: Skin Integrity: Goal: Risk for impaired skin integrity will decrease Outcome: Progressing   

## 2022-11-02 NOTE — Telephone Encounter (Signed)
Per Dr. Sedalia Muta "schedule on [facility] tomorrow morning at 10:40. "  Patient notified of appointment time and arrival time.

## 2022-11-03 ENCOUNTER — Ambulatory Visit (INDEPENDENT_AMBULATORY_CARE_PROVIDER_SITE_OTHER): Payer: Managed Care, Other (non HMO) | Admitting: Physician Assistant

## 2022-11-03 ENCOUNTER — Encounter: Payer: Self-pay | Admitting: Physician Assistant

## 2022-11-03 VITALS — BP 124/58 | HR 71 | Temp 97.3°F | Resp 16 | Ht 71.0 in | Wt 217.6 lb

## 2022-11-03 DIAGNOSIS — I2699 Other pulmonary embolism without acute cor pulmonale: Secondary | ICD-10-CM | POA: Diagnosis not present

## 2022-11-03 DIAGNOSIS — F5101 Primary insomnia: Secondary | ICD-10-CM | POA: Diagnosis not present

## 2022-11-03 DIAGNOSIS — Z09 Encounter for follow-up examination after completed treatment for conditions other than malignant neoplasm: Secondary | ICD-10-CM

## 2022-11-03 LAB — CULTURE, BLOOD (ROUTINE X 2)
Culture: NO GROWTH
Culture: NO GROWTH
Culture: NO GROWTH
Culture: NO GROWTH
Special Requests: ADEQUATE
Special Requests: ADEQUATE
Special Requests: ADEQUATE
Special Requests: ADEQUATE

## 2022-11-03 MED ORDER — ESZOPICLONE 3 MG PO TABS
3.0000 mg | ORAL_TABLET | Freq: Every day | ORAL | 0 refills | Status: DC | PRN
Start: 2022-11-03 — End: 2022-12-14

## 2022-11-03 NOTE — Assessment & Plan Note (Signed)
Uncontrolled Patient will follow up with Dr. Faylene Kurtz about changing due to decreased efficacy.

## 2022-11-03 NOTE — Assessment & Plan Note (Signed)
Reports no issues or complaints since coming out of hospital Continue to monitor signs and symptoms Has chronic follow up on September 3rd with Dr. Faylene Kurtz.

## 2022-11-07 ENCOUNTER — Other Ambulatory Visit: Payer: Self-pay | Admitting: Physician Assistant

## 2022-11-07 DIAGNOSIS — R1013 Epigastric pain: Secondary | ICD-10-CM

## 2022-11-07 MED ORDER — DICYCLOMINE HCL 20 MG PO TABS
20.0000 mg | ORAL_TABLET | Freq: Four times a day (QID) | ORAL | 1 refills | Status: DC
Start: 2022-11-07 — End: 2022-11-23

## 2022-11-08 NOTE — Assessment & Plan Note (Signed)
Controlled Continue on Eliquis 10mg  as directed Will have chronic follow up on Wednesday Discussed and educated on severe bleeding precautions.

## 2022-11-08 NOTE — Progress Notes (Signed)
Subjective:  Patient ID: Jacob Collier, male    DOB: 02-Aug-1955  Age: 67 y.o. MRN: [id number]  Chief Complaint  Patient presents with   Medical Management of Chronic Issues    HPI   Hypertension: Taking Amlodipine 5 mg 1 tablet in the AM and 1/2 tablet(2.5 mg) in the evening.  Hyperlipidemia: Taking Repatha 140 mg injection every two weeks, Zetia 10 mg 1 tablet daily, aspirin 81 mg daily, Omega 3 fatty acids 500 mg take 1 tablet daily.  Acute Pulmonary Embolism: Taking Eliquis 5 mg take 1 tablet twice daily.  Primary Insomnia: Taking Lunesta 3 mg 1 tablet at night time.        11/03/2022   10:40 AM 07/27/2022    3:21 PM 11/20/2020   11:03 AM 10/26/2020    1:52 PM 05/19/2020    1:40 PM  Depression screen PHQ 2/9  Decreased Interest 0 0 0 0 0  Down, Depressed, Hopeless 0 0 0 0 0  PHQ - 2 Score 0 0 0 0 0  Altered sleeping 0 0     Tired, decreased energy 1 1     Change in appetite 0 0     Feeling bad or failure about yourself  0 0     Trouble concentrating 0 0     Moving slowly or fidgety/restless 0 0     Suicidal thoughts 0 0     PHQ-9 Score 1 1     Difficult doing work/chores Not difficult at all Not difficult at all           11/03/2022   10:40 AM  Fall Risk   Falls in the past year? 0  Number falls in past yr: 0  Injury with Fall? 0  Follow up Falls evaluation completed    Patient Care Team: Blane Ohara, MD as PCP - General (Family Medicine) Lennette Bihari, MD as PCP - Cardiology (Cardiology) Mealor, Roberts Gaudy, MD as PCP - Electrophysiology (Cardiology) Crista Elliot, MD as Consulting Physician (Urology) Corrin Parker, PA-C as Physician Assistant (Cardiology)   Review of Systems  Constitutional:  Negative for appetite change, fatigue and fever.  HENT:  Negative for congestion, ear pain, sinus pressure and sore throat.   Respiratory:  Negative for cough, chest tightness, shortness of breath and wheezing.   Cardiovascular:  Negative for chest  pain and palpitations.  Gastrointestinal:  Negative for abdominal pain, constipation, diarrhea, nausea and vomiting.  Genitourinary:  Negative for dysuria and hematuria.  Musculoskeletal:  Negative for arthralgias, back pain, joint swelling and myalgias.  Skin:  Negative for rash.  Neurological:  Negative for dizziness, weakness and headaches.  Psychiatric/Behavioral:  Negative for dysphoric mood. The patient is not nervous/anxious.     Current Outpatient Medications on File Prior to Visit  Medication Sig Dispense Refill   acetaminophen (TYLENOL) 500 MG tablet Take 1,000 mg by mouth every 8 (eight) hours as needed for mild pain.     amLODipine (NORVASC) 5 MG tablet Take 1 tablet (5 mg) by mouth every morning & take 0.5 tablet (2.5 mg) by mouth at night. 135 tablet 1   apixaban (ELIQUIS) 5 MG TABS tablet *Starting evening of 8/28, take 2 tablets (10 mg total) by mouth 2 (two) times daily for 6 days, THEN 1 tablet (5 mg total) 2 (two) times daily starting 11/08/22 72 tablet 0   aspirin EC 81 MG tablet Take 81 mg by mouth daily. Swallow whole.  benzonatate (TESSALON) 100 MG capsule Take by mouth.     clonazePAM (KLONOPIN) 1 MG tablet TAKE 1 TABLET BY MOUTH TWICE A DAY AS NEEDED FOR ANXIETY (Patient taking differently: Take 1 mg by mouth 2 (two) times daily. TAKE 1 TABLET BY MOUTH TWICE A DAY AS NEEDED FOR ANXIETY) 60 tablet 3   cyclobenzaprine (FLEXERIL) 10 MG tablet Take 10 mg by mouth 3 (three) times daily as needed for muscle spasms.     dicyclomine (BENTYL) 20 MG tablet Take 1 tablet (20 mg total) by mouth every 6 (six) hours. 20 tablet 1   Eszopiclone 3 MG TABS Take 1 tablet (3 mg total) by mouth daily as needed (sleep). 30 tablet 0   Evolocumab (REPATHA SURECLICK) 140 MG/ML SOAJ INJECT 140 MG INTO THE SKIN EVERY 14 (FOURTEEN) DAYS. 6 mL 3   ezetimibe (ZETIA) 10 MG tablet TAKE 1 TABLET BY MOUTH EVERY DAY 90 tablet 3   Omega-3 Fatty Acids (OMEGA 3 PO) Take 500 mg by mouth every morning.      Phenylephrine HCl (AFRIN ALLERGY NA) Place 2 sprays into the nose 2 (two) times daily.     No current facility-administered medications on file prior to visit.   Past Medical History:  Diagnosis Date   Anxiety    Arthritis    Coronary artery disease    a. S/p DES to RCA in 01/2019 b. s/p CABG x4 (LIMA-LAD, reverse SVG-PDA, reverse SVG-OM1, reverse SVG-1st Diag) in 01/2021   Diverticulosis    Dyspnea    Hard of hearing    Hyperlipidemia    Hypertension    hx of elevation on lyrica, off med and now normal    Paroxysmal SVT (supraventricular tachycardia)    Post-op atrial fibrillation    Prostate cancer (HCC)    Wears glasses    Past Surgical History:  Procedure Laterality Date   ANTERIOR CERVICAL DECOMP/DISCECTOMY FUSION N/A 09/03/2021   Procedure: CERVICAL FIVE-SIX, CERVICAL SIX-SEVEN ANTERIOR CERVICAL DECOMPRESSION/DISCECTOMY FUSION;  Surgeon: Julio Sicks, MD;  Location: [facility] OR;  Service: Neurosurgery;  Laterality: N/A;   CARDIAC CATHETERIZATION     with 2 stents    COLECTOMY  05/2018   COLONOSCOPY     CORONARY ARTERY BYPASS GRAFT N/A 01/19/2021   Procedure: CORONARY ARTERY BYPASS GRAFTING (CABG), ON PUMP TIMES FOUR, USING LEFT INTERNAL MAMMARY ARTERY AND ENDOSOCPICALLY HARVESTED RIGHT GREATER SAPHENOUS VEIN;  Surgeon: Corliss Skains, MD;  Location: [facility] OR;  Service: Open Heart Surgery;  Laterality: N/A;   CYSTOSCOPY WITH URETHRAL DILATATION N/A 01/14/2021   Procedure: CYSTOSCOPY WITH BALLOON  DILATATION;  Surgeon: Alfredo Martinez, MD;  Location: [facility] ORS;  Service: Urology;  Laterality: N/A;   EYE SURGERY  02/2020   bilaterl cataracts   IR THORACENTESIS ASP PLEURAL SPACE W/IMG GUIDE  02/02/2021   LEFT HEART CATH AND CORONARY ANGIOGRAPHY N/A 12/24/2020   Procedure: LEFT HEART CATH AND CORONARY ANGIOGRAPHY;  Surgeon: [place], Peter M, MD;  Location: [facility] INVASIVE CV LAB;  Service: Cardiovascular;  Laterality: N/A;   PROSTATECTOMY     SVT ABLATION N/A 10/20/2022   Procedure: SVT  ABLATION;  Surgeon: Maurice Small, MD;  Location: [facility] INVASIVE CV LAB;  Service: Cardiovascular;  Laterality: N/A;   TEE WITHOUT CARDIOVERSION N/A 01/19/2021   Procedure: TRANSESOPHAGEAL ECHOCARDIOGRAM (TEE);  Surgeon: Corliss Skains, MD;  Location: [facility] OR;  Service: Open Heart Surgery;  Laterality: N/A;   TOTAL HIP ARTHROPLASTY Left 11/10/2015   Procedure: LEFT TOTAL HIP ARTHROPLASTY ANTERIOR APPROACH;  Surgeon:  Kathryne Hitch, MD;  Location: [facility] OR;  Service: Orthopedics;  Laterality: Left;    Family History  Problem Relation Age of Onset   Alzheimer's disease Mother    Stroke Father    Diabetes Sister    Heart attack Maternal Grandmother    Cancer Maternal Uncle        prostate   Prostate cancer Maternal Uncle    Cancer Maternal Uncle        prostate   Prostate cancer Maternal Uncle    Colon polyps Brother    Colon cancer Neg Hx    Esophageal cancer Neg Hx    Rectal cancer Neg Hx    Stomach cancer Neg Hx    Social History   Socioeconomic History   Marital status: Married    Spouse name: Not on file   Number of children: 0   Years of education: Not on file   Highest education level: Not on file  Occupational History   Occupation: Employed at [employer]  Tobacco Use   Smoking status: Never   Smokeless tobacco: Never  Vaping Use   Vaping status: Never Used  Substance and Sexual Activity   Alcohol use: Not Currently    Alcohol/week: 0.0 standard drinks of alcohol    Comment: rarely   Drug use: No   Sexual activity: Yes    Partners: Female    Birth control/protection: None  Other Topics Concern   Not on file  Social History Narrative   Not on file   Social Determinants of Health   Financial Resource Strain: Low Risk  (08/04/2022)   Overall Financial Resource Strain (CARDIA)    Difficulty of Paying Living Expenses: Not hard at all  Food Insecurity: No Food Insecurity (10/29/2022)   Hunger Vital Sign    Worried About Running Out of Food in the  Last Year: Never true    Ran Out of Food in the Last Year: Never true  Transportation Needs: No Transportation Needs (10/29/2022)   PRAPARE - Administrator, Civil Service (Medical): No    Lack of Transportation (Non-Medical): No  Physical Activity: Sufficiently Active (08/04/2022)   Exercise Vital Sign    Days of Exercise per Week: 5 days    Minutes of Exercise per Session: 60 min  Stress: No Stress Concern Present (08/04/2022)   [employer] of Occupational Health - Occupational Stress Questionnaire    Feeling of Stress : Not at all  Social Connections: Moderately Isolated (08/04/2022)   Social Connection and Isolation Panel [NHANES]    Frequency of Communication with Friends and Family: More than three times a week    Frequency of Social Gatherings with Friends and Family: More than three times a week    Attends Religious Services: Never    Database administrator or Organizations: No    Attends Banker Meetings: Never    Marital Status: Married    Objective:  There were no vitals taken for this visit.     11/03/2022   10:45 AM 11/02/2022   11:15 AM 11/02/2022    7:24 AM  BP/Weight  Systolic BP 124 132 148  Diastolic BP 58 80 75  Wt. (Lbs) 217.6    BMI 30.35 kg/m2      Physical Exam  Diabetic Foot Exam - Simple   No data filed      Lab Results  Component Value Date   WBC 5.3 11/02/2022   HGB 17.3 (H) 11/02/2022  HCT 51.5 11/02/2022   PLT 204 11/02/2022   GLUCOSE 113 (H) 11/02/2022   CHOL 84 (L) 07/27/2022   TRIG 132 07/27/2022   HDL 39 (L) 07/27/2022   LDLCALC 22 07/27/2022   ALT 29 10/31/2022   AST 42 (H) 10/31/2022   NA 135 11/02/2022   K 3.7 11/02/2022   CL 106 11/02/2022   CREATININE 1.10 11/02/2022   BUN 12 11/02/2022   CO2 20 (L) 11/02/2022   TSH 1.570 04/04/2022   INR 1.1 10/28/2022   HGBA1C 5.8 (H) 03/04/2021      Assessment & Plan:    Mixed hyperlipidemia  Primary hypertension  Paroxysmal supraventricular  tachycardia  Other acute pulmonary embolism without acute cor pulmonale (HCC)  Primary insomnia  GAD (generalized anxiety disorder)     No orders of the defined types were placed in this encounter.   No orders of the defined types were placed in this encounter.    Follow-up: No follow-ups on file.   I,Lauren M Auman,acting as a Neurosurgeon for [employer], MD.,have documented all relevant documentation on the behalf of Windell Moment, MD,as directed by  Windell Moment, MD while in the presence of Windell Moment, MD.   An After Visit Summary was printed and given to the patient.  Windell Moment, MD [facility] [phone]

## 2022-11-08 NOTE — Addendum Note (Signed)
Addended by: Langley Gauss on: 11/08/2022 08:33 AM   Modules accepted: Level of Service

## 2022-11-16 IMAGING — CR DG CHEST 2V
2 series · 2 of 2 positions shown · non-contrast
Comparison: 06/11/2019.

CLINICAL DATA: Chest discomfort and shortness of breath beginning 2
days ago. History of cardiac stents.

EXAM:
CHEST - 2 VIEW

[chest pa]
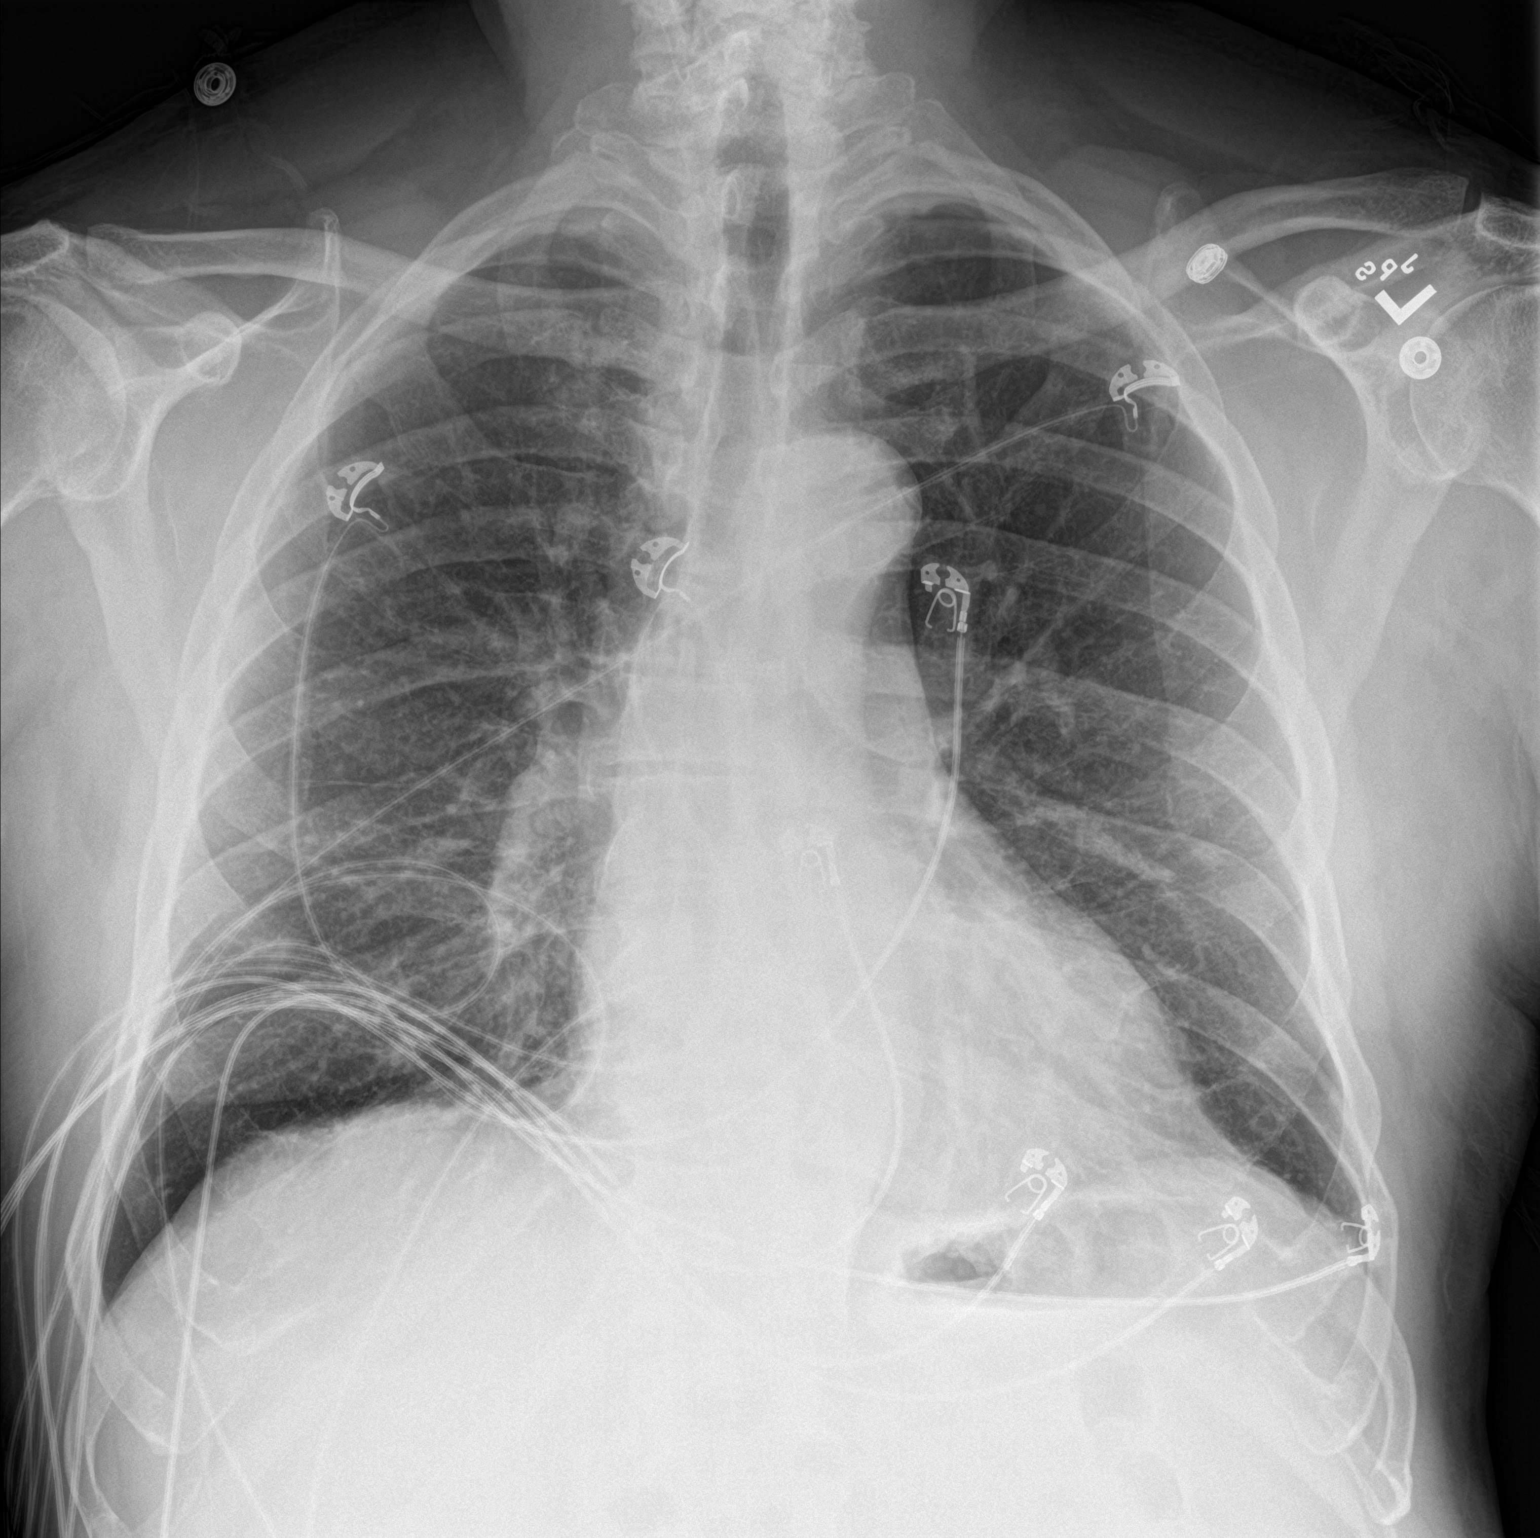

[chest lat]
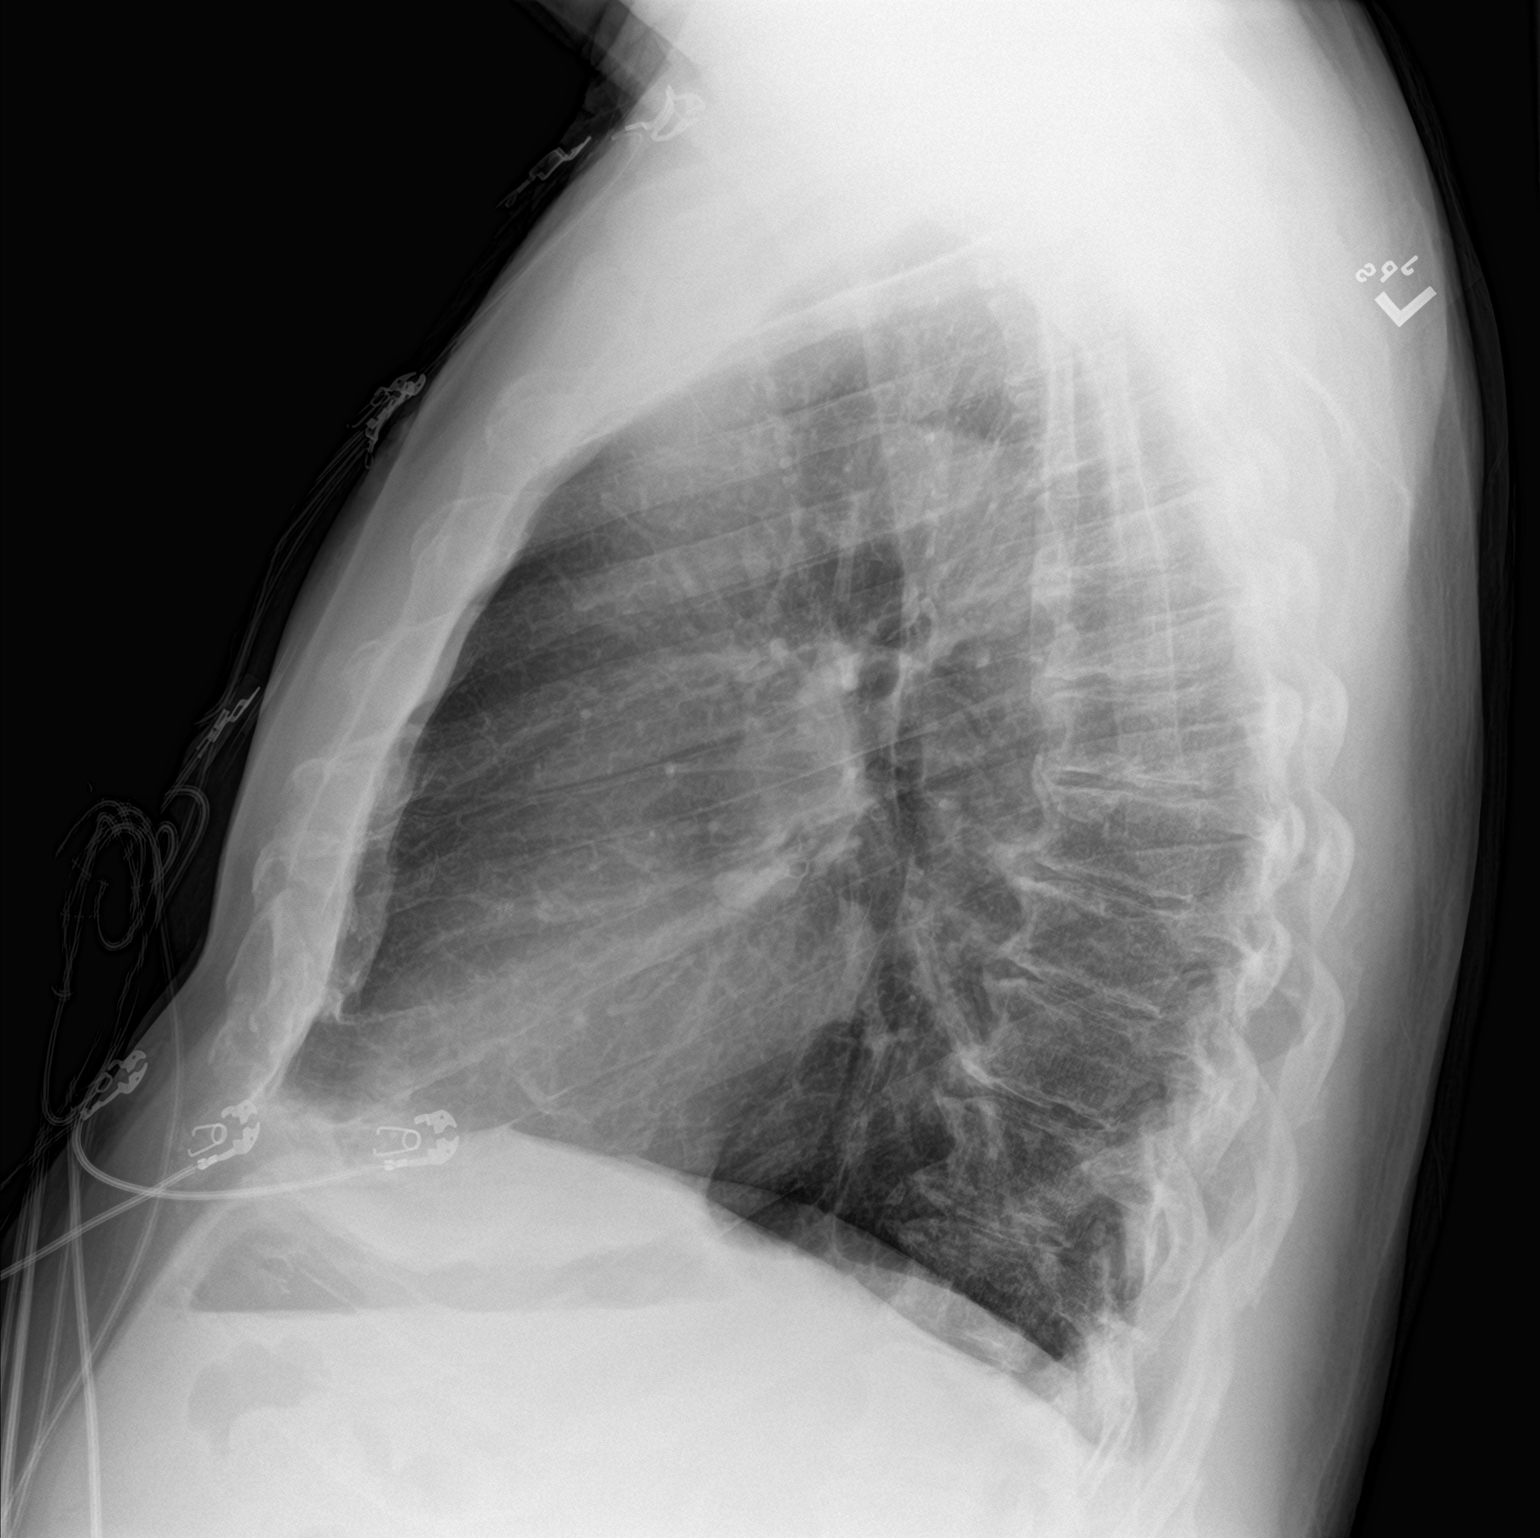

[2 of 2 positions shown; findings below may reference images not displayed]

FINDINGS: Cardiac silhouette is normal in size. No mediastinal or hilar masses
or evidence of adenopathy.

Lungs are clear.  No pleural effusion or pneumothorax.

Skeletal structures are intact.
IMPRESSION: No active cardiopulmonary disease.

## 2022-11-18 ENCOUNTER — Ambulatory Visit: Payer: Managed Care, Other (non HMO) | Admitting: Cardiovascular Disease

## 2022-11-23 ENCOUNTER — Telehealth: Payer: Self-pay | Admitting: Pharmacy Technician

## 2022-11-23 ENCOUNTER — Encounter: Payer: Self-pay | Admitting: Physician Assistant

## 2022-11-23 ENCOUNTER — Other Ambulatory Visit (HOSPITAL_COMMUNITY): Payer: Self-pay

## 2022-11-23 ENCOUNTER — Ambulatory Visit (INDEPENDENT_AMBULATORY_CARE_PROVIDER_SITE_OTHER): Payer: Managed Care, Other (non HMO) | Admitting: Physician Assistant

## 2022-11-23 ENCOUNTER — Telehealth: Payer: Self-pay

## 2022-11-23 VITALS — BP 122/72 | HR 57 | Temp 98.1°F | Ht 71.0 in | Wt 217.0 lb

## 2022-11-23 DIAGNOSIS — I10 Essential (primary) hypertension: Secondary | ICD-10-CM

## 2022-11-23 DIAGNOSIS — E538 Deficiency of other specified B group vitamins: Secondary | ICD-10-CM

## 2022-11-23 DIAGNOSIS — R1013 Epigastric pain: Secondary | ICD-10-CM | POA: Insufficient documentation

## 2022-11-23 DIAGNOSIS — E559 Vitamin D deficiency, unspecified: Secondary | ICD-10-CM

## 2022-11-23 DIAGNOSIS — J301 Allergic rhinitis due to pollen: Secondary | ICD-10-CM

## 2022-11-23 DIAGNOSIS — E782 Mixed hyperlipidemia: Secondary | ICD-10-CM | POA: Diagnosis not present

## 2022-11-23 MED ORDER — DICYCLOMINE HCL 20 MG PO TABS
20.0000 mg | ORAL_TABLET | Freq: Four times a day (QID) | ORAL | 2 refills | Status: AC
Start: 2022-11-23 — End: ?

## 2022-11-23 MED ORDER — LORATADINE 10 MG PO TABS: 10.0000 mg | ORAL_TABLET | Freq: Every day | ORAL | 11 refills | Status: DC

## 2022-11-23 NOTE — Assessment & Plan Note (Addendum)
Well controlled.  Continue to work on eating a healthy diet and exercise.  Labs drawn today.   No major side effects reported, and no issues with compliance. The current medical regimen is effective;  continue present plan with Zetia 10mg , Repatha 140mg  Will adjust medication as needed depending on labs Lab Results  Component Value Date   LDLCALC 22 07/27/2022

## 2022-11-23 NOTE — Assessment & Plan Note (Signed)
Labs drawn today Will adjust treatment depending on labs

## 2022-11-23 NOTE — Progress Notes (Signed)
Acute Office Visit  Subjective:    Patient ID: Jacob Collier, male    DOB: Jun 24, 1955, 67 y.o.   MRN: [id number]  Chief Complaint  Patient presents with   Cough    Shortness of breath    HPI: Patient is in today for cough/shortness of breath. Patient states that August 15th of this year, patient was admitted to the hospital for an ablation in which procedure went fine. Patient then two days later spiked fever of 102 then went back to the ER with Shortness of breath and CT of the chest showed a blood clot in the lung which patient was placed on Eliquis 5 mg 2 tablets twice daily for 6 days and then after the 6 days the patient was to take the 5 mg 1 tablet twice daily. Patient has continued to take the eliquis 5 mg 1 tablet twice daily and continues to feel fatigued, shortness of breath and with a dry cough that doesn't stop. Patient states that while in the hospital he had 2L of fluid pulled off his left lung. States that he did feel a lot better after that, states that he looked into his new medicine and that a dry cough can come from the eliquis.   Was concerned about some popping he was having in his right ear. States he always has sinus congestion and that it has started to cause some popping in his ear. Currently uses afrin at night to help open up his sinuses so he can use his CPAP machine. Continues to have some sinus congestion and some sinus drainage.   Past Medical History:  Diagnosis Date   Anxiety    Arthritis    Coronary artery disease    a. S/p DES to RCA in 01/2019 b. s/p CABG x4 (LIMA-LAD, reverse SVG-PDA, reverse SVG-OM1, reverse SVG-1st Diag) in 01/2021   Diverticulosis    Dyspnea    Hard of hearing    Hyperlipidemia    Hypertension    hx of elevation on lyrica, off med and now normal    Paroxysmal SVT (supraventricular tachycardia)    Post-op atrial fibrillation    Prostate cancer (HCC)    Wears glasses     Past Surgical History:  Procedure Laterality Date    ANTERIOR CERVICAL DECOMP/DISCECTOMY FUSION N/A 09/03/2021   Procedure: CERVICAL FIVE-SIX, CERVICAL SIX-SEVEN ANTERIOR CERVICAL DECOMPRESSION/DISCECTOMY FUSION;  Surgeon: Julio Sicks, MD;  Location: [facility] OR;  Service: Neurosurgery;  Laterality: N/A;   CARDIAC CATHETERIZATION     with 2 stents    COLECTOMY  05/2018   COLONOSCOPY     CORONARY ARTERY BYPASS GRAFT N/A 01/19/2021   Procedure: CORONARY ARTERY BYPASS GRAFTING (CABG), ON PUMP TIMES FOUR, USING LEFT INTERNAL MAMMARY ARTERY AND ENDOSOCPICALLY HARVESTED RIGHT GREATER SAPHENOUS VEIN;  Surgeon: Corliss Skains, MD;  Location: [facility] OR;  Service: Open Heart Surgery;  Laterality: N/A;   CYSTOSCOPY WITH URETHRAL DILATATION N/A 01/14/2021   Procedure: CYSTOSCOPY WITH BALLOON  DILATATION;  Surgeon: Alfredo Martinez, MD;  Location: [facility] ORS;  Service: Urology;  Laterality: N/A;   EYE SURGERY  02/2020   bilaterl cataracts   IR THORACENTESIS ASP PLEURAL SPACE W/IMG GUIDE  02/02/2021   LEFT HEART CATH AND CORONARY ANGIOGRAPHY N/A 12/24/2020   Procedure: LEFT HEART CATH AND CORONARY ANGIOGRAPHY;  Surgeon: Swaziland, Peter M, MD;  Location: [facility] INVASIVE CV LAB;  Service: Cardiovascular;  Laterality: N/A;   PROSTATECTOMY     SVT ABLATION N/A 10/20/2022   Procedure: SVT ABLATION;  Surgeon: Maurice Small, MD;  Location: [facility] INVASIVE CV LAB;  Service: Cardiovascular;  Laterality: N/A;   TEE WITHOUT CARDIOVERSION N/A 01/19/2021   Procedure: TRANSESOPHAGEAL ECHOCARDIOGRAM (TEE);  Surgeon: Corliss Skains, MD;  Location: [facility] OR;  Service: Open Heart Surgery;  Laterality: N/A;   TOTAL HIP ARTHROPLASTY Left 11/10/2015   Procedure: LEFT TOTAL HIP ARTHROPLASTY ANTERIOR APPROACH;  Surgeon: Kathryne Hitch, MD;  Location: [facility] OR;  Service: Orthopedics;  Laterality: Left;    Family History  Problem Relation Age of Onset   Alzheimer's disease Mother    Stroke Father    Diabetes Sister    Heart attack Maternal Grandmother    Cancer Maternal Uncle         prostate   Prostate cancer Maternal Uncle    Cancer Maternal Uncle        prostate   Prostate cancer Maternal Uncle    Colon polyps Brother    Colon cancer Neg Hx    Esophageal cancer Neg Hx    Rectal cancer Neg Hx    Stomach cancer Neg Hx     Social History   Socioeconomic History   Marital status: Married    Spouse name: Not on file   Number of children: 0   Years of education: Not on file   Highest education level: Not on file  Occupational History   Occupation: Employed at [employer]  Tobacco Use   Smoking status: Never   Smokeless tobacco: Never  Vaping Use   Vaping status: Never Used  Substance and Sexual Activity   Alcohol use: Not Currently    Alcohol/week: 0.0 standard drinks of alcohol    Comment: rarely   Drug use: No   Sexual activity: Yes    Partners: Female    Birth control/protection: None  Other Topics Concern   Not on file  Social History Narrative   Not on file   Social Determinants of Health   Financial Resource Strain: Low Risk  (08/04/2022)   Overall Financial Resource Strain (CARDIA)    Difficulty of Paying Living Expenses: Not hard at all  Food Insecurity: No Food Insecurity (10/29/2022)   Hunger Vital Sign    Worried About Running Out of Food in the Last Year: Never true    Ran Out of Food in the Last Year: Never true  Transportation Needs: No Transportation Needs (10/29/2022)   PRAPARE - Administrator, Civil Service (Medical): No    Lack of Transportation (Non-Medical): No  Physical Activity: Sufficiently Active (08/04/2022)   Exercise Vital Sign    Days of Exercise per Week: 5 days    Minutes of Exercise per Session: 60 min  Stress: No Stress Concern Present (08/04/2022)   [employer] of Occupational Health - Occupational Stress Questionnaire    Feeling of Stress : Not at all  Social Connections: Moderately Isolated (08/04/2022)   Social Connection and Isolation Panel [NHANES]    Frequency of Communication with  Friends and Family: More than three times a week    Frequency of Social Gatherings with Friends and Family: More than three times a week    Attends Religious Services: Never    Database administrator or Organizations: No    Attends Banker Meetings: Never    Marital Status: Married  Catering manager Violence: Not At Risk (10/29/2022)   Humiliation, Afraid, Rape, and Kick questionnaire    Fear of Current or Ex-Partner: No    Emotionally Abused:  No    Physically Abused: No    Sexually Abused: No    Outpatient Medications Prior to Visit  Medication Sig Dispense Refill   acetaminophen (TYLENOL) 500 MG tablet Take 1,000 mg by mouth every 8 (eight) hours as needed for mild pain.     amLODipine (NORVASC) 5 MG tablet Take 1 tablet (5 mg) by mouth every morning & take 0.5 tablet (2.5 mg) by mouth at night. 135 tablet 1   apixaban (ELIQUIS) 5 MG TABS tablet *Starting evening of 8/28, take 2 tablets (10 mg total) by mouth 2 (two) times daily for 6 days, THEN 1 tablet (5 mg total) 2 (two) times daily starting 11/08/22 72 tablet 0   aspirin EC 81 MG tablet Take 81 mg by mouth daily. Swallow whole.     benzonatate (TESSALON) 100 MG capsule Take by mouth.     clonazePAM (KLONOPIN) 1 MG tablet TAKE 1 TABLET BY MOUTH TWICE A DAY AS NEEDED FOR ANXIETY (Patient taking differently: Take 1 mg by mouth 2 (two) times daily. TAKE 1 TABLET BY MOUTH TWICE A DAY AS NEEDED FOR ANXIETY) 60 tablet 3   cyclobenzaprine (FLEXERIL) 10 MG tablet Take 10 mg by mouth 3 (three) times daily as needed for muscle spasms.     Eszopiclone 3 MG TABS Take 1 tablet (3 mg total) by mouth daily as needed (sleep). 30 tablet 0   Evolocumab (REPATHA SURECLICK) 140 MG/ML SOAJ INJECT 140 MG INTO THE SKIN EVERY 14 (FOURTEEN) DAYS. 6 mL 3   ezetimibe (ZETIA) 10 MG tablet TAKE 1 TABLET BY MOUTH EVERY DAY 90 tablet 3   Omega-3 Fatty Acids (OMEGA 3 PO) Take 500 mg by mouth every morning.     Phenylephrine HCl (AFRIN ALLERGY NA) Place 2  sprays into the nose 2 (two) times daily.     dicyclomine (BENTYL) 20 MG tablet Take 1 tablet (20 mg total) by mouth every 6 (six) hours. 20 tablet 1   No facility-administered medications prior to visit.    Allergies  Allergen Reactions   Ranolazine Other (See Comments)    Chest discomfort/Reflux   Amoxicillin Hives   Atorvastatin Other (See Comments)    myalgias  Other reaction(s): Myalgias (intolerance)   Pravastatin Other (See Comments)    myalgias  Other reaction(s): Myalgias (intolerance)   Rosuvastatin Other (See Comments)    myalgias  Other reaction(s): Myalgias (intolerance)   Statins Other (See Comments)    myalgias    Review of Systems  Constitutional:  Positive for fatigue. Negative for appetite change and fever.  HENT:  Negative for congestion, ear pain, sinus pressure and sore throat.   Respiratory:  Positive for cough (Dry) and shortness of breath. Negative for chest tightness and wheezing.   Cardiovascular:  Negative for chest pain and palpitations.  Gastrointestinal:  Negative for abdominal pain, constipation, diarrhea, nausea and vomiting.  Genitourinary:  Negative for dysuria and hematuria.  Musculoskeletal:  Negative for arthralgias, back pain, joint swelling and myalgias.  Skin:  Negative for rash.  Neurological:  Negative for dizziness, weakness and headaches.  Psychiatric/Behavioral:  Negative for dysphoric mood. The patient is not nervous/anxious.        Objective:        11/23/2022    1:46 PM 11/03/2022   10:45 AM 11/02/2022   11:15 AM  Vitals with BMI  Height 5\' 11"  5\' 11"    Weight 217 lbs 217 lbs 10 oz   BMI 30.28 30.36   Systolic 122 124 696  Diastolic 72 58 80  Pulse 57 71 61    Orthostatic VS for the past 72 hrs (Last 3 readings):  Patient Position BP Location  11/23/22 1346 Sitting Left Arm     Physical Exam Vitals reviewed.  Constitutional:      Appearance: Normal appearance.  HENT:     Right Ear: Hearing and ear canal  normal. No middle ear effusion. No mastoid tenderness. Tympanic membrane is bulging. Tympanic membrane is not injected or erythematous.     Left Ear: Hearing, tympanic membrane and ear canal normal.  Cardiovascular:     Rate and Rhythm: Normal rate and regular rhythm.     Heart sounds: Normal heart sounds.  Pulmonary:     Effort: Pulmonary effort is normal.     Breath sounds: Normal breath sounds.  Abdominal:     General: Bowel sounds are normal.     Palpations: Abdomen is soft.     Tenderness: There is no abdominal tenderness.  Neurological:     Mental Status: He is alert and oriented to person, place, and time.  Psychiatric:        Mood and Affect: Mood normal.        Behavior: Behavior normal.     Health Maintenance Due  Topic Date Due   Medicare Annual Wellness (AWV)  Never done   DTaP/Tdap/Td (1 - Tdap) Never done   Zoster Vaccines- Shingrix (1 of 2) Never done   INFLUENZA VACCINE  10/06/2022    There are no preventive care reminders to display for this patient.   Lab Results  Component Value Date   TSH 1.570 04/04/2022   Lab Results  Component Value Date   WBC 5.3 11/02/2022   HGB 17.3 (H) 11/02/2022   HCT 51.5 11/02/2022   MCV 84.4 11/02/2022   PLT 204 11/02/2022   Lab Results  Component Value Date   NA 135 11/02/2022   K 3.7 11/02/2022   CO2 20 (L) 11/02/2022   GLUCOSE 113 (H) 11/02/2022   BUN 12 11/02/2022   CREATININE 1.10 11/02/2022   BILITOT 0.8 10/31/2022   ALKPHOS 54 10/31/2022   AST 42 (H) 10/31/2022   ALT 29 10/31/2022   PROT 5.9 (L) 10/31/2022   ALBUMIN 3.0 (L) 10/31/2022   CALCIUM 8.4 (L) 11/02/2022   ANIONGAP 9 11/02/2022   EGFR 70 10/04/2022   Lab Results  Component Value Date   CHOL 84 (L) 07/27/2022   Lab Results  Component Value Date   HDL 39 (L) 07/27/2022   Lab Results  Component Value Date   LDLCALC 22 07/27/2022   Lab Results  Component Value Date   TRIG 132 07/27/2022   Lab Results  Component Value Date    CHOLHDL 2.2 07/27/2022   Lab Results  Component Value Date   HGBA1C 5.8 (H) 03/04/2021       Assessment & Plan:  Mixed hyperlipidemia Assessment & Plan: Well controlled.  Continue to work on eating a healthy diet and exercise.  Labs drawn today.   No major side effects reported, and no issues with compliance. The current medical regimen is effective;  continue present plan with Zetia 10mg , Repatha 140mg  Will adjust medication as needed depending on labs Lab Results  Component Value Date   LDLCALC 22 07/27/2022     Orders: -     Lipid panel  Epigastric pain Assessment & Plan: Refilled Dicyclomine 20mg  Continue taking as prescribed  Orders: -     Dicyclomine HCl; Take 1  tablet (20 mg total) by mouth every 6 (six) hours.  Dispense: 60 tablet; Refill: 2  Vitamin B12 deficiency Assessment & Plan: Labs drawn today Will adjust treatment depending on results  Orders: -     B12 and Folate Panel -     VITAMIN D 25 Hydroxy (Vit-D Deficiency, Fractures)  Essential hypertension Assessment & Plan: Well controlled.  Continue to work on eating a healthy diet and exercise.  Labs drawn today.   No major side effects reported, and no issues with compliance. The current medical regimen is effective;  continue present plan with Amlodipine 5mg , Aspirin 81mg  Will adjust medication as needed depending on labs BP Readings from Last 3 Encounters:  11/23/22 122/72  11/03/22 (!) 124/58  11/02/22 132/80     Orders: -     CBC with Differential/Platelet -     Comprehensive metabolic panel  Seasonal allergic rhinitis due to pollen Assessment & Plan: Sent Claritin 10mg  Take daily for seasonal allergies   Orders: -     Loratadine; Take 1 tablet (10 mg total) by mouth daily.  Dispense: 30 tablet; Refill: 11  Vitamin D deficiency Assessment & Plan: Labs drawn today Will adjust treatment depending on labs.      Meds ordered this encounter  Medications   dicyclomine  (BENTYL) 20 MG tablet    Sig: Take 1 tablet (20 mg total) by mouth every 6 (six) hours.    Dispense:  60 tablet    Refill:  2   loratadine (CLARITIN) 10 MG tablet    Sig: Take 1 tablet (10 mg total) by mouth daily.    Dispense:  30 tablet    Refill:  11    Orders Placed This Encounter  Procedures   CBC with Differential/Platelet   B12 and Folate Panel   Comprehensive metabolic panel   VITAMIN D 25 Hydroxy (Vit-D Deficiency, Fractures)   Lipid panel     Follow-up: Return in about 3 months (around 02/22/2023) for Chronic, fasting, Huston Foley.  An After Visit Summary was printed and given to the patient.   I,Lauren M Auman,acting as a Neurosurgeon for [employer], PA.,have documented all relevant documentation on the behalf of Langley Gauss, PA,as directed by  Langley Gauss, PA while in the presence of Langley Gauss, [name].    Langley Gauss, [name] [facility] [phone]

## 2022-11-23 NOTE — Telephone Encounter (Signed)
Pharmacy Patient Advocate Encounter   Received notification from CoverMyMeds that prior authorization for repatha is required/requested.   Insurance verification completed.   The patient is insured through  [employer]  .   Per test claim: The current 11/23/22 day co-pay is, $11.20.  No PA needed at this time. This test claim was processed through [facility]- copay amounts may vary at other pharmacies due to pharmacy/plan contracts, or as the patient moves through the different stages of their insurance plan.

## 2022-11-23 NOTE — Assessment & Plan Note (Signed)
Well controlled.  Continue to work on eating a healthy diet and exercise.  Labs drawn today.   No major side effects reported, and no issues with compliance. The current medical regimen is effective;  continue present plan with Amlodipine 5mg , Aspirin 81mg  Will adjust medication as needed depending on labs BP Readings from Last 3 Encounters:  11/23/22 122/72  11/03/22 (!) 124/58  11/02/22 132/80

## 2022-11-23 NOTE — Assessment & Plan Note (Signed)
Refilled Dicyclomine 20mg  Continue taking as prescribed

## 2022-11-23 NOTE — Assessment & Plan Note (Signed)
Labs drawn today Will adjust treatment depending on results.

## 2022-11-23 NOTE — Telephone Encounter (Signed)
Patient wife called patient has been feeling very fatigue, SOB, cough, and congestion. Wife stated he starting feeling bad on Monday. Wife is going to test him for covid and he has an appointment at 1:30.   Recommend if his symptoms get worst he needs to go to he nearest ED to be seen.

## 2022-11-23 NOTE — Assessment & Plan Note (Addendum)
Sent Claritin 10mg  Take daily for seasonal allergies

## 2022-11-24 LAB — CBC WITH DIFFERENTIAL/PLATELET
Basophils Absolute: 0.1 10*3/uL (ref 0.0–0.2)
Basos: 1 %
EOS (ABSOLUTE): 0.3 10*3/uL (ref 0.0–0.4)
Eos: 4 %
Hematocrit: 56.5 % — ABNORMAL HIGH (ref 37.5–51.0)
Hemoglobin: 18.6 g/dL — ABNORMAL HIGH (ref 13.0–17.7)
Immature Grans (Abs): 0 10*3/uL (ref 0.0–0.1)
Immature Granulocytes: 0 %
Lymphocytes Absolute: 2.1 10*3/uL (ref 0.7–3.1)
Lymphs: 27 %
MCH: 28.9 pg (ref 26.6–33.0)
MCHC: 32.9 g/dL (ref 31.5–35.7)
MCV: 88 fL (ref 79–97)
Monocytes Absolute: 0.6 10*3/uL (ref 0.1–0.9)
Monocytes: 8 %
Neutrophils Absolute: 4.7 10*3/uL (ref 1.4–7.0)
Neutrophils: 60 %
Platelets: 198 10*3/uL (ref 150–450)
RBC: 6.44 x10E6/uL — ABNORMAL HIGH (ref 4.14–5.80)
RDW: 15.4 % (ref 11.6–15.4)
WBC: 7.8 10*3/uL (ref 3.4–10.8)

## 2022-11-24 LAB — COMPREHENSIVE METABOLIC PANEL
ALT: 34 IU/L (ref 0–44)
AST: 24 IU/L (ref 0–40)
Albumin: 4.6 g/dL (ref 3.9–4.9)
Alkaline Phosphatase: 87 IU/L (ref 44–121)
BUN/Creatinine Ratio: 13 (ref 10–24)
BUN: 14 mg/dL (ref 8–27)
Bilirubin Total: 0.6 mg/dL (ref 0.0–1.2)
CO2: 17 mmol/L — ABNORMAL LOW (ref 20–29)
Calcium: 9.5 mg/dL (ref 8.6–10.2)
Chloride: 104 mmol/L (ref 96–106)
Creatinine, Ser: 1.1 mg/dL (ref 0.76–1.27)
Globulin, Total: 2.3 g/dL (ref 1.5–4.5)
Glucose: 88 mg/dL (ref 70–99)
Potassium: 4.7 mmol/L (ref 3.5–5.2)
Sodium: 141 mmol/L (ref 134–144)
Total Protein: 6.9 g/dL (ref 6.0–8.5)
eGFR: 73 mL/min/{1.73_m2} (ref >59)

## 2022-11-24 LAB — LIPID PANEL
Chol/HDL Ratio: 2.6 ratio (ref 0.0–5.0)
Cholesterol, Total: 86 mg/dL — ABNORMAL LOW (ref 100–199)
HDL: 33 mg/dL — ABNORMAL LOW (ref >39)
LDL Chol Calc (NIH): 27 mg/dL (ref 0–99)
Triglycerides: 150 mg/dL — ABNORMAL HIGH (ref 0–149)
VLDL Cholesterol Cal: 26 mg/dL (ref 5–40)

## 2022-11-24 LAB — B12 AND FOLATE PANEL
Folate: 10.6 ng/mL (ref >3.0)
Vitamin B-12: 691 pg/mL (ref 232–1245)

## 2022-11-24 LAB — VITAMIN D 25 HYDROXY (VIT D DEFICIENCY, FRACTURES): Vit D, 25-Hydroxy: 30.1 ng/mL (ref 30.0–100.0)

## 2022-11-28 ENCOUNTER — Telehealth: Payer: Self-pay | Admitting: Cardiovascular Disease

## 2022-11-28 ENCOUNTER — Other Ambulatory Visit: Payer: Self-pay | Admitting: Physician Assistant

## 2022-11-28 ENCOUNTER — Telehealth: Payer: Self-pay

## 2022-11-28 ENCOUNTER — Encounter: Payer: Self-pay | Admitting: Physician Assistant

## 2022-11-28 DIAGNOSIS — I2699 Other pulmonary embolism without acute cor pulmonale: Secondary | ICD-10-CM

## 2022-11-28 MED ORDER — RIVAROXABAN 20 MG PO TABS: 20.0000 mg | ORAL_TABLET | Freq: Every day | ORAL | 3 refills | Status: DC

## 2022-11-28 NOTE — Telephone Encounter (Signed)
Jacob Collier called to request that he be changed from Eliquis to Xarelto.  He is experiencing joint pain, shortness of breath and generally feeling lousy.  He talked with cardiology and they advised that he call us for approval.  Symptoms dicussed with Jacob Gauss, PA.  He will send Xarelto.

## 2022-11-28 NOTE — Telephone Encounter (Signed)
Pt c/o medication issue:  1. Name of Medication:   apixaban (ELIQUIS) 5 MG TABS tablet    2. How are you currently taking this medication (dosage and times per day)?  *Starting evening of 8/28, take 2 tablets (10 mg total) by mouth 2 (two) times daily for 6 days, THEN 1 tablet (5 mg total) 2 (two) times daily starting 11/08/22       3. Are you having a reaction (difficulty breathing--STAT)? No  4. What is your medication issue? Pt is requesting a callback regarding this medication causing him to have some issues breathing and a dry couugh, some dizziness and lots of joint pains. He'd like to discuss other options as well. Please advise.

## 2022-11-28 NOTE — Telephone Encounter (Signed)
Called the patient back.  He states he has joint pain, and SOB that is new that he thinks has come on since starting the Eliquis.  I adv it could be the PE itself as to why he is SOB, not so much the Eliquis.   The joint pain he said is his shoulders and fingers most notably.    I adv the PE treatment will likely be from PCP, not Dr. Nelly Laurence.  He is going to call Dr. Sedalia Muta for direction and will as about the possibility of switching to Xarelto.  Adv I would forward to Dr. Nelly Laurence and if he has any new recommendations we will call the patient back.

## 2022-11-30 ENCOUNTER — Other Ambulatory Visit: Payer: Self-pay | Admitting: Physician Assistant

## 2022-11-30 DIAGNOSIS — R7989 Other specified abnormal findings of blood chemistry: Secondary | ICD-10-CM

## 2022-11-30 DIAGNOSIS — R1013 Epigastric pain: Secondary | ICD-10-CM

## 2022-12-02 ENCOUNTER — Encounter: Payer: Self-pay | Admitting: Cardiovascular Disease

## 2022-12-02 ENCOUNTER — Ambulatory Visit: Payer: Managed Care, Other (non HMO) | Attending: Cardiovascular Disease | Admitting: Cardiovascular Disease

## 2022-12-02 VITALS — BP 132/70 | HR 70 | Ht 71.0 in | Wt 226.2 lb

## 2022-12-02 DIAGNOSIS — I471 Supraventricular tachycardia, unspecified: Secondary | ICD-10-CM | POA: Diagnosis not present

## 2022-12-02 NOTE — Progress Notes (Signed)
Electrophysiology Office Note:    Date:  12/02/2022   ID:  JAWORSKI ROUND, DOB 09-Dec-1955, MRN [id number]  PCP:  Blane Ohara, MD   [facility] Providers Cardiologist:  Nicki Guadalajara, MD Cardiology APP:  Corrin Parker, PA-C  Electrophysiologist:  Maurice Small, MD     Referring MD: Blane Ohara, MD   History of Present Illness:    Jacob Collier is a 67 y.o. male with a hx listed below, significant for paroxysmal SVT, referred for arrhythmia management.  He recently wore a monitor that detected occasional episodes of tachycardia. The longest lasted almost 15 minutes.  Atrial activity is difficult to discern due to artifact.  He has had rapid and irregular rhythms for years, predating his CABG.  Seems like they have gotten worse recently.  He is particular bothered by rapid rates which last for a few minutes.  He uses a pulse ox and it frequently shows rates of about 145 bpm.  These occur with rapid onset but oftentimes slow down gradually.  He also notices occasional irregular rhythms.  He has not had syncope.  He has had edema with amlodipine.  He does not tolerate higher doses of beta-blocker very well.   Past Medical History:  Diagnosis Date   Anxiety    Arthritis    Coronary artery disease    a. S/p DES to RCA in 01/2019 b. s/p CABG x4 (LIMA-LAD, reverse SVG-PDA, reverse SVG-OM1, reverse SVG-1st Diag) in 01/2021   Diverticulosis    Dyspnea    Hard of hearing    Hyperlipidemia    Hypertension    hx of elevation on lyrica, off med and now normal    Paroxysmal SVT (supraventricular tachycardia)    Post-op atrial fibrillation    Prostate cancer (HCC)    Wears glasses     Past Surgical History:  Procedure Laterality Date   ANTERIOR CERVICAL DECOMP/DISCECTOMY FUSION N/A 09/03/2021   Procedure: CERVICAL FIVE-SIX, CERVICAL SIX-SEVEN ANTERIOR CERVICAL DECOMPRESSION/DISCECTOMY FUSION;  Surgeon: Julio Sicks, MD;  Location: [facility] OR;  Service: Neurosurgery;   Laterality: N/A;   CARDIAC CATHETERIZATION     with 2 stents    COLECTOMY  05/2018   COLONOSCOPY     CORONARY ARTERY BYPASS GRAFT N/A 01/19/2021   Procedure: CORONARY ARTERY BYPASS GRAFTING (CABG), ON PUMP TIMES FOUR, USING LEFT INTERNAL MAMMARY ARTERY AND ENDOSOCPICALLY HARVESTED RIGHT GREATER SAPHENOUS VEIN;  Surgeon: Corliss Skains, MD;  Location: [facility] OR;  Service: Open Heart Surgery;  Laterality: N/A;   CYSTOSCOPY WITH URETHRAL DILATATION N/A 01/14/2021   Procedure: CYSTOSCOPY WITH BALLOON  DILATATION;  Surgeon: Alfredo Martinez, MD;  Location: [facility] ORS;  Service: Urology;  Laterality: N/A;   EYE SURGERY  02/2020   bilaterl cataracts   IR THORACENTESIS ASP PLEURAL SPACE W/IMG GUIDE  02/02/2021   LEFT HEART CATH AND CORONARY ANGIOGRAPHY N/A 12/24/2020   Procedure: LEFT HEART CATH AND CORONARY ANGIOGRAPHY;  Surgeon: Swaziland, Peter M, MD;  Location: [facility] INVASIVE CV LAB;  Service: Cardiovascular;  Laterality: N/A;   PROSTATECTOMY     SVT ABLATION N/A 10/20/2022   Procedure: SVT ABLATION;  Surgeon: Maurice Small, MD;  Location: [facility] INVASIVE CV LAB;  Service: Cardiovascular;  Laterality: N/A;   TEE WITHOUT CARDIOVERSION N/A 01/19/2021   Procedure: TRANSESOPHAGEAL ECHOCARDIOGRAM (TEE);  Surgeon: Corliss Skains, MD;  Location: [facility] OR;  Service: Open Heart Surgery;  Laterality: N/A;   TOTAL HIP ARTHROPLASTY Left 11/10/2015   Procedure: LEFT TOTAL HIP ARTHROPLASTY ANTERIOR  APPROACH;  Surgeon: Kathryne Hitch, MD;  Location: [facility] OR;  Service: Orthopedics;  Laterality: Left;    Current Medications: Current Meds  Medication Sig   acetaminophen (TYLENOL) 500 MG tablet Take 1,000 mg by mouth every 8 (eight) hours as needed for mild pain.   amLODipine (NORVASC) 5 MG tablet Take 1 tablet (5 mg) by mouth every morning & take 0.5 tablet (2.5 mg) by mouth at night.   aspirin EC 81 MG tablet Take 81 mg by mouth daily. Swallow whole.   clonazePAM (KLONOPIN) 1 MG tablet TAKE 1 TABLET BY  MOUTH TWICE A DAY AS NEEDED FOR ANXIETY (Patient taking differently: Take 1 mg by mouth 2 (two) times daily. TAKE 1 TABLET BY MOUTH TWICE A DAY AS NEEDED FOR ANXIETY)   Eszopiclone 3 MG TABS Take 1 tablet (3 mg total) by mouth daily as needed (sleep).   Evolocumab (REPATHA SURECLICK) 140 MG/ML SOAJ INJECT 140 MG INTO THE SKIN EVERY 14 (FOURTEEN) DAYS.   ezetimibe (ZETIA) 10 MG tablet TAKE 1 TABLET BY MOUTH EVERY DAY   loratadine (CLARITIN) 10 MG tablet Take 1 tablet (10 mg total) by mouth daily.   Omega-3 Fatty Acids (OMEGA 3 PO) Take 500 mg by mouth every morning.   Phenylephrine HCl (AFRIN ALLERGY NA) Place 2 sprays into the nose 2 (two) times daily.   rivaroxaban (XARELTO) 20 MG TABS tablet Take 1 tablet (20 mg total) by mouth daily with supper.   [DISCONTINUED] benzonatate (TESSALON) 100 MG capsule Take by mouth.   [DISCONTINUED] cyclobenzaprine (FLEXERIL) 10 MG tablet Take 10 mg by mouth 3 (three) times daily as needed for muscle spasms.   [DISCONTINUED] dicyclomine (BENTYL) 20 MG tablet Take 1 tablet (20 mg total) by mouth every 6 (six) hours.     Allergies:   Ranolazine, Amoxicillin, Atorvastatin, Pravastatin, Rosuvastatin, and Statins   Social and Family History: Reviewed in Epic  ROS:   Please see the history of present illness.    All other systems reviewed and are negative.  EKGs/Labs/Other Studies Reviewed Today:    Echocardiogram:  TEE 02/01/21 EF 50% with mildly hypokinetic inferior wall    Monitors:  ZioXT 11 days 04/2022 Sinus rhythm HR 47-99 bpm, avg 69. 2% PACs  Multiple episodes of narrow complex tachycardia One strip (2/10 10:11:03) appears to show a short R-P  EKG:  Last EKG results: sinus rhythm with PACs   Recent Labs: 04/04/2022: TSH 1.570 10/28/2022: B Natriuretic Peptide 40.3 11/02/2022: Magnesium 2.2 11/23/2022: ALT 34; BUN 14; Creatinine, Ser 1.10; Hemoglobin 18.6; Platelets 198; Potassium 4.7; Sodium 141     Physical Exam:    VS:  BP 132/70    Pulse 70   Ht 5\' 11"  (1.803 m)   Wt 226 lb 3.2 oz (102.6 kg)   SpO2 92%   BMI 31.55 kg/m     Wt Readings from Last 3 Encounters:  12/02/22 226 lb 3.2 oz (102.6 kg)  11/23/22 217 lb (98.4 kg)  11/03/22 217 lb 9.6 oz (98.7 kg)     GEN: Well nourished, well developed in no acute distress CARDIAC: RRR, no murmurs, rubs, gallops RESPIRATORY:  Normal work of breathing MUSCULOSKELETAL: no edema    ASSESSMENT & PLAN:    AVNRT AVNRT induced during EP study and the slow pathway was modified He has not had any symptoms to suggest recurrence of AVNRT after the ablation He has had frequent PACs in the past  Pulmonary embolus I suspect this is due to venous vascular access  and stasis after his ablation, would consider this a provoked PE Continue Xarelto for at least 3 months postevent   CAD s/p DES and CABG  On ASA and plavix.  Currently on Xarelto and aspirin (Plavix held) due to PE Continue metoprolol            Medication Adjustments/Labs and Tests Ordered: Current medicines are reviewed at length with the patient today.  Concerns regarding medicines are outlined above.  Orders Placed This Encounter  Procedures   EKG 12-Lead   No orders of the defined types were placed in this encounter.    Signed, Maurice Small, MD  12/02/2022 4:09 PM    [facility]

## 2022-12-02 NOTE — Patient Instructions (Signed)
Medication Instructions:  Your physician recommends that you continue on your current medications as directed. Please refer to the Current Medication list given to you today.  *If you need a refill on your cardiac medications before your next appointment, please call your pharmacy*  Follow-Up: At [facility], you and your health needs are our priority.  As part of our continuing mission to provide you with exceptional heart care, we have created designated Provider Care Teams.  These Care Teams include your primary Cardiologist (physician) and Advanced Practice Providers (APPs -  Physician Assistants and Nurse Practitioners) who all work together to provide you with the care you need, when you need it.  Your next appointment:   1 year  Provider:   You may see Maurice Small, MD or one of the following Advanced Practice Providers on your designated Care Team:   Francis Dowse, [place] [address] [place], [place] Sherie Don, NP Canary Brim, NP

## 2022-12-04 ENCOUNTER — Encounter (HOSPITAL_COMMUNITY): Payer: Self-pay

## 2022-12-04 ENCOUNTER — Emergency Department (HOSPITAL_COMMUNITY): Payer: Managed Care, Other (non HMO)

## 2022-12-04 ENCOUNTER — Other Ambulatory Visit: Payer: Self-pay

## 2022-12-04 ENCOUNTER — Emergency Department (HOSPITAL_COMMUNITY)
Admission: EM | Admit: 2022-12-04 | Discharge: 2022-12-04 | Disposition: A | Discharge status: Home / Self Care | Payer: Managed Care, Other (non HMO) | Attending: Emergency Medicine | Admitting: Emergency Medicine

## 2022-12-04 DIAGNOSIS — Z951 Presence of aortocoronary bypass graft: Secondary | ICD-10-CM | POA: Diagnosis not present

## 2022-12-04 DIAGNOSIS — I2510 Atherosclerotic heart disease of native coronary artery without angina pectoris: Secondary | ICD-10-CM | POA: Diagnosis not present

## 2022-12-04 DIAGNOSIS — R0602 Shortness of breath: Secondary | ICD-10-CM | POA: Insufficient documentation

## 2022-12-04 DIAGNOSIS — Z7982 Long term (current) use of aspirin: Secondary | ICD-10-CM | POA: Insufficient documentation

## 2022-12-04 DIAGNOSIS — M549 Dorsalgia, unspecified: Secondary | ICD-10-CM | POA: Insufficient documentation

## 2022-12-04 DIAGNOSIS — T148XXA Other injury of unspecified body region, initial encounter: Secondary | ICD-10-CM

## 2022-12-04 LAB — I-STAT CHEM 8, ED
BUN: 14 mg/dL (ref 8–23)
Calcium, Ion: 0.91 mmol/L — ABNORMAL LOW (ref 1.15–1.40)
Chloride: 111 mmol/L (ref 98–111)
Creatinine, Ser: 0.8 mg/dL (ref 0.61–1.24)
Glucose, Bld: 110 mg/dL — ABNORMAL HIGH (ref 70–99)
HCT: 46 % (ref 39.0–52.0)
Hemoglobin: 15.6 g/dL (ref 13.0–17.0)
Potassium: 5.4 mmol/L — ABNORMAL HIGH (ref 3.5–5.1)
Sodium: 139 mmol/L (ref 135–145)
TCO2: 20 mmol/L — ABNORMAL LOW (ref 22–32)

## 2022-12-04 LAB — CBC WITH DIFFERENTIAL/PLATELET
Abs Immature Granulocytes: 0.04 10*3/uL (ref 0.00–0.07)
Basophils Absolute: 0.1 10*3/uL (ref 0.0–0.1)
Basophils Relative: 1 %
Eosinophils Absolute: 0.4 10*3/uL (ref 0.0–0.5)
Eosinophils Relative: 4 %
HCT: 48.8 % (ref 39.0–52.0)
Hemoglobin: 16.5 g/dL (ref 13.0–17.0)
Immature Granulocytes: 0 %
Lymphocytes Relative: 28 %
Lymphs Abs: 2.8 10*3/uL (ref 0.7–4.0)
MCH: 29.1 pg (ref 26.0–34.0)
MCHC: 33.8 g/dL (ref 30.0–36.0)
MCV: 86.1 fL (ref 80.0–100.0)
Monocytes Absolute: 0.6 10*3/uL (ref 0.1–1.0)
Monocytes Relative: 6 %
Neutro Abs: 6 10*3/uL (ref 1.7–7.7)
Neutrophils Relative %: 61 %
Platelets: 185 10*3/uL (ref 150–400)
RBC: 5.67 MIL/uL (ref 4.22–5.81)
RDW: 14.8 % (ref 11.5–15.5)
WBC: 9.9 10*3/uL (ref 4.0–10.5)
nRBC: 0 % (ref 0.0–0.2)

## 2022-12-04 LAB — COMPREHENSIVE METABOLIC PANEL
ALT: 29 U/L (ref 0–44)
AST: 22 U/L (ref 15–41)
Albumin: 3.3 g/dL — ABNORMAL LOW (ref 3.5–5.0)
Alkaline Phosphatase: 58 U/L (ref 38–126)
Anion gap: 10 (ref 5–15)
BUN: 11 mg/dL (ref 8–23)
CO2: 20 mmol/L — ABNORMAL LOW (ref 22–32)
Calcium: 8.5 mg/dL — ABNORMAL LOW (ref 8.9–10.3)
Chloride: 108 mmol/L (ref 98–111)
Creatinine, Ser: 1.09 mg/dL (ref 0.61–1.24)
GFR, Estimated: >60 mL/min (ref >60)
Glucose, Bld: 131 mg/dL — ABNORMAL HIGH (ref 70–99)
Potassium: 4 mmol/L (ref 3.5–5.1)
Sodium: 138 mmol/L (ref 135–145)
Total Bilirubin: 0.3 mg/dL (ref 0.3–1.2)
Total Protein: 6 g/dL — ABNORMAL LOW (ref 6.5–8.1)

## 2022-12-04 LAB — PROTIME-INR
INR: 1 (ref 0.8–1.2)
Prothrombin Time: 13.4 s (ref 11.4–15.2)

## 2022-12-04 LAB — TROPONIN I (HIGH SENSITIVITY)
Troponin I (High Sensitivity): 23 ng/L — ABNORMAL HIGH (ref <18)
Troponin I (High Sensitivity): 23 ng/L — ABNORMAL HIGH (ref <18)
Troponin I (High Sensitivity): 25 ng/L — ABNORMAL HIGH (ref <18)

## 2022-12-04 LAB — LIPASE, BLOOD: Lipase: 26 U/L (ref 11–51)

## 2022-12-04 MED ORDER — HYDROMORPHONE HCL 1 MG/ML IJ SOLN
1.0000 mg | Freq: Once | INTRAMUSCULAR | Status: AC
  Administered 2022-12-04: 1 mg via INTRAVENOUS
  Filled 2022-12-04: qty 1

## 2022-12-04 MED ORDER — FENTANYL CITRATE PF 50 MCG/ML IJ SOSY
50.0000 ug | PREFILLED_SYRINGE | Freq: Once | INTRAMUSCULAR | Status: AC
  Administered 2022-12-04: 50 ug via INTRAVENOUS
  Filled 2022-12-04: qty 1

## 2022-12-04 MED ORDER — ACETAMINOPHEN 500 MG PO TABS
1000.0000 mg | ORAL_TABLET | Freq: Once | ORAL | Status: AC
  Administered 2022-12-04: 1000 mg via ORAL
  Filled 2022-12-04: qty 2

## 2022-12-04 MED ORDER — LIDOCAINE 5 % EX PTCH
1.0000 | MEDICATED_PATCH | CUTANEOUS | Status: DC
  Administered 2022-12-04: 1 via TRANSDERMAL
  Filled 2022-12-04: qty 1

## 2022-12-04 MED ORDER — IOHEXOL 350 MG/ML SOLN
100.0000 mL | Freq: Once | INTRAVENOUS | Status: AC | PRN
  Administered 2022-12-04: 100 mL via INTRAVENOUS

## 2022-12-04 NOTE — ED Triage Notes (Addendum)
Pt from home c/o 3-4 hours of severe back pain between shoulder blades. Hx of CABG and blood clots. Given 324 ASA, 50 mcg fentanyl abnd 1 nitro spray

## 2022-12-04 NOTE — ED Provider Notes (Signed)
  Physical Exam  BP (!) 122/56   Pulse 63   Temp 98.7 F (37.1 C) (Oral)   Resp 17   Ht 5\' 11"  (1.803 m)   Wt 101.6 kg   SpO2 95%   BMI 31.24 kg/m   Physical Exam  Procedures  Procedures  ED Course / MDM    Medical Decision Making Amount and/or Complexity of Data Reviewed Labs: ordered. Radiology: ordered.  Risk OTC drugs. Prescription drug management.   Jacob Collier, assumed care for this patient.  In brief this is a 67 year old male who presented to the emergency department for an episode of left-sided chest pain and back pain.  Patient was signed out to me pending delta troponin.  I reassessed the patient, he is no longer having any pain or discomfort.  He had a CT scan of his aorta that was negative for dissection.  He is currently on blood thinners.  Patient had a 4-hour troponin done that also did not show elevation.  His EKG is nonischemic.  I agree with the prior assessment and think that this patient was likely experiencing musculoskeletal pain.  Based on his reassuring workup, do not believe this patient requires admission.  I discussed this with the patient and he was agreeable with this plan.  Return precautions were discussed with the patient at bedside.  He will follow-up with his primary care doctor.       [name] T, DO 12/04/22 1219

## 2022-12-04 NOTE — ED Provider Notes (Signed)
Jacob Collier EMERGENCY DEPARTMENT AT [facility] Provider Note   CSN: [id number] Arrival date & time: 12/04/22  0418     History  Chief Complaint  Patient presents with   Back Pain    Jacob Collier is a 67 y.o. male.  The history is provided by the patient, the EMS personnel and medical records.  Back Pain Jacob Collier is a 67 y.o. male who presents to the Emergency Department complaining of back pain.  He presents to the emergency department for evaluation of severe right-sided back pain that started 3 to 4 hours prior to ED arrival.  Patient describes it as a burning since this back.  He has associated shortness of breath.  No fevers, cough, abdominal pain.  No prior similar symptoms.  He does have a history of coronary artery disease status post CABG as well as pulmonary embolism.  He was intolerant of Eliquis and was transition to Xarelto for his PE.  PE was in August.  He did not have prior pain with his PE.  EMS reports blood pressures 260s.  He received aspirin, nitroglycerin and fentanyl prior to ED arrival with minimal improvement in his symptoms.     Home Medications Prior to Admission medications   Medication Sig Start Date End Date Taking? Authorizing Provider  acetaminophen (TYLENOL) 500 MG tablet Take 1,000 mg by mouth every 8 (eight) hours as needed for mild pain.    [provider]  amLODipine (NORVASC) 5 MG tablet Take 1 tablet (5 mg) by mouth every morning & take 0.5 tablet (2.5 mg) by mouth at night. 10/26/22   Lennette Bihari, MD  aspirin EC 81 MG tablet Take 81 mg by mouth daily. Swallow whole.    [provider]  clonazePAM (KLONOPIN) 1 MG tablet TAKE 1 TABLET BY MOUTH TWICE A DAY AS NEEDED FOR ANXIETY Patient taking differently: Take 1 mg by mouth 2 (two) times daily. TAKE 1 TABLET BY MOUTH TWICE A DAY AS NEEDED FOR ANXIETY 10/25/22   Sirivol, Kristie Cowman, MD  Eszopiclone 3 MG TABS Take 1 tablet (3 mg total) by mouth daily as needed  (sleep). 11/03/22   Langley Gauss, PA  Evolocumab (REPATHA SURECLICK) 140 MG/ML SOAJ INJECT 140 MG INTO THE SKIN EVERY 14 (FOURTEEN) DAYS. 01/31/22   Lennette Bihari, MD  ezetimibe (ZETIA) 10 MG tablet TAKE 1 TABLET BY MOUTH EVERY DAY 05/10/22   Lennette Bihari, MD  loratadine (CLARITIN) 10 MG tablet Take 1 tablet (10 mg total) by mouth daily. 11/23/22   Langley Gauss, PA  Omega-3 Fatty Acids (OMEGA 3 PO) Take 500 mg by mouth every morning.    [provider]  Phenylephrine HCl (AFRIN ALLERGY NA) Place 2 sprays into the nose 2 (two) times daily.    [provider]  rivaroxaban (XARELTO) 20 MG TABS tablet Take 1 tablet (20 mg total) by mouth daily with supper. 11/28/22   Langley Gauss, PA      Allergies    Ranolazine, Amoxicillin, Atorvastatin, Pravastatin, Rosuvastatin, and Statins    Review of Systems   Review of Systems  Musculoskeletal:  Positive for back pain.  All other systems reviewed and are negative.   Physical Exam Updated Vital Signs BP (!) 146/86   Pulse 67   Temp 98.3 F (36.8 C) (Oral)   Resp 18   Ht 5\' 11"  (1.803 m)   Wt 101.6 kg   SpO2 98%   BMI 31.24 kg/m  Physical Exam Vitals  and nursing note reviewed.  Constitutional:      General: He is in acute distress.     Appearance: He is well-developed. He is ill-appearing.  HENT:     Head: Normocephalic and atraumatic.  Cardiovascular:     Rate and Rhythm: Normal rate and regular rhythm.     Heart sounds: No murmur heard. Pulmonary:     Effort: Pulmonary effort is normal. No respiratory distress.     Breath sounds: Normal breath sounds.  Abdominal:     Palpations: Abdomen is soft.     Tenderness: There is no abdominal tenderness. There is no guarding or rebound.  Musculoskeletal:        General: No tenderness.     Comments: 2+ radial and DP pulses bilaterally  Skin:    General: Skin is warm and dry.     Coloration: Skin is not pale.  Neurological:     Mental Status: He is alert and oriented to  person, place, and time.  Psychiatric:        Behavior: Behavior normal.     ED Results / Procedures / Treatments   Labs (all labs ordered are listed, but only abnormal results are displayed) Labs Reviewed  COMPREHENSIVE METABOLIC PANEL - Abnormal; Notable for the following components:      Result Value   CO2 20 (*)    Glucose, Bld 131 (*)    Calcium 8.5 (*)    Total Protein 6.0 (*)    Albumin 3.3 (*)    All other components within normal limits  I-STAT CHEM 8, ED - Abnormal; Notable for the following components:   Potassium 5.4 (*)    Glucose, Bld 110 (*)    Calcium, Ion 0.91 (*)    TCO2 20 (*)    All other components within normal limits  TROPONIN I (HIGH SENSITIVITY) - Abnormal; Notable for the following components:   Troponin I (High Sensitivity) 23 (*)    All other components within normal limits  CBC WITH DIFFERENTIAL/PLATELET  PROTIME-INR  LIPASE, BLOOD    EKG None  Radiology CT Angio Chest/Abd/Pel for Dissection W and/or W/WO  Result Date: 12/04/2022 CLINICAL DATA:  Severe back pain for 3-4 hours acute aortic syndrome suspected, history of prostate cancer status post prostatectomy * Tracking Code: BO * EXAM: CT ANGIOGRAPHY CHEST, ABDOMEN AND PELVIS TECHNIQUE: Non-contrast CT of the chest was initially obtained. Multidetector CT imaging through the chest, abdomen and pelvis was performed using the standard protocol during bolus administration of intravenous contrast. Multiplanar reconstructed images and MIPs were obtained and reviewed to evaluate the vascular anatomy. RADIATION DOSE REDUCTION: This exam was performed according to the departmental dose-optimization program which includes automated exposure control, adjustment of the mA and/or kV according to patient size and/or use of iterative reconstruction technique. CONTRAST:  OMNIPAQUE IOHEXOL 350 MG/ML SOLN COMPARISON:  CT chest angiogram, 10/29/2022, CT abdomen pelvis, 09/21/2022 FINDINGS: CTA CHEST FINDINGS  VASCULAR Aorta: Satisfactory opacification of the aorta. Normal contour and caliber of the thoracic aorta. No evidence of aneurysm, dissection, or other acute aortic pathology. Cardiovascular: No evidence of pulmonary embolism on limited non-tailored examination. Cardiomegaly. Left and right coronary artery calcifications status post median sternotomy and CABG. No pericardial effusion. Review of the MIP images confirms the above findings. NON VASCULAR Mediastinum/Nodes: No enlarged mediastinal, hilar, or axillary lymph nodes. Thyroid gland, trachea, and esophagus demonstrate no significant findings. Lungs/Pleura: Lungs are clear. No pleural effusion or pneumothorax. Musculoskeletal: No chest wall abnormality. No acute osseous  findings. Review of the MIP images confirms the above findings. CTA ABDOMEN AND PELVIS FINDINGS VASCULAR Normal contour and caliber of the abdominal aorta. No evidence of aneurysm, dissection, or other acute aortic pathology. 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 Sharp impression of the median arcuate ligament on the origin of the celiac axis (series 11, image 108). Review of the MIP images confirms the above findings. NON-VASCULAR Hepatobiliary: No solid liver abnormality is seen. Hepatic steatosis. No gallstones, gallbladder wall thickening, or biliary dilatation. Pancreas: Unremarkable. No pancreatic ductal dilatation or surrounding inflammatory changes. Spleen: Normal in size without significant abnormality. Adrenals/Urinary Tract: Adrenal glands are unremarkable. Small nonobstructive calculus of the inferior pole of the right kidney. No left-sided calculi, ureteral calculi, or hydronephrosis. Simple, benign right renal cortical cysts, for which no further follow-up or characterization is required. Bladder is unremarkable. Stomach/Bowel: Stomach is within normal limits. Appendix appears normal. No  evidence of bowel wall thickening, distention, or inflammatory changes. Sigmoid diverticulosis. Moderate burden of stool and stool balls throughout the colon and rectum. Lymphatic: No enlarged abdominal or pelvic lymph nodes. Reproductive: Status post prostatectomy. Other: No abdominal wall hernia or abnormality. No ascites. Musculoskeletal: No acute osseous findings. Status post left hip total arthroplasty. IMPRESSION: 1. Normal contour and caliber of the thoracic and abdominal aorta. No evidence of aneurysm, dissection, or other acute aortic pathology. 2. Sharp impression of the median arcuate ligament on the origin of the celiac axis, which may be seen in the setting of median arcuate ligament syndrome if clinically referable signs and symptoms are present. 3. No acute non-vascular findings of the chest, abdomen, or pelvis. 4. Cardiomegaly and coronary artery disease status post median sternotomy and CABG. 5. Hepatic steatosis. 6. Nonobstructive right nephrolithiasis. 7. Sigmoid diverticulosis. 8. Status post prostatectomy. Electronically Signed   By: Jearld Lesch M.D.   On: 12/04/2022 07:01   DG Chest Port 1 View  Result Date: 12/04/2022 CLINICAL DATA:  Chest and back pain EXAM: PORTABLE CHEST 1 VIEW COMPARISON:  10/30/2022 FINDINGS: Artifact from to limit tree leads overlies the left lower lung. Previous median sternotomy. Stable cardiac enlargement. No pleural fluid, interstitial edema or airspace disease. Pulmonary vascular congestion. No airspace opacities. Status post ACDF. IMPRESSION: Cardiomegaly and pulmonary vascular congestion. Electronically Signed   By: Signa Kell M.D.   On: 12/04/2022 05:24    Procedures Procedures    Medications Ordered in ED Medications  lidocaine (LIDODERM) 5 % 1 patch (has no administration in time range)  fentaNYL (SUBLIMAZE) injection 50 mcg (50 mcg Intravenous Given 12/04/22 0435)  fentaNYL (SUBLIMAZE) injection 50 mcg (50 mcg Intravenous Given 12/04/22 0518)   iohexol (OMNIPAQUE) 350 MG/ML injection 100 mL (100 mLs Intravenous Contrast Given 12/04/22 0616)  HYDROmorphone (DILAUDID) injection 1 mg (1 mg Intravenous Given 12/04/22 6295)    ED Course/ Medical Decision Making/ A&P                                 Medical Decision Making Amount and/or Complexity of Data Reviewed Labs: ordered. Radiology: ordered.  Risk Prescription drug management.   Patient with history of coronary artery disease status post CABG, recent PE on anticoagulation here for evaluation of right-sided thoracic back pain.  He was activated as a STEMI alert by EMS, this was discontinued by cardiology prior to ED arrival.  EKG does not have acute ischemic changes.  He does have pain referred to his right scapular region on examination without any overlying soft tissue changes.  This is not reproducible on exam.  He is neurologically intact with good perfusion in all 4 extremities.  CTA is negative for dissection.  He does have findings that could be consistent with median arcuate ligament syndrome.  He does not have any abdominal symptoms, current clinical picture is not consistent with this contributing to his symptoms.  Initial troponin is mildly elevated.  His pain is improved after medications in the emergency department.  Plan to treat with additional medications, check repeat troponin.  Patient care transferred pending repeat troponin.        Final Clinical Impression(s) / ED Diagnoses Final diagnoses:  None    Rx / DC Orders ED Discharge Orders     None         Tilden Fossa, MD 12/04/22 [phone]

## 2022-12-04 NOTE — Discharge Instructions (Signed)
While you are in the emergency department, you had blood test done to look for signs of injury to your heart, or your lungs.  These tests were normal.  Your CT scan of your chest did not show a blood clot, or problems with your aorta.  I would like you to follow-up with your primary care doctor this week.  Come back to the emergency department if you experience new or worsening pain, difficulty with breathing or nausea.

## 2022-12-05 ENCOUNTER — Encounter: Payer: Self-pay | Admitting: Physician Assistant

## 2022-12-05 ENCOUNTER — Other Ambulatory Visit: Payer: Self-pay | Admitting: Physician Assistant

## 2022-12-05 ENCOUNTER — Ambulatory Visit (INDEPENDENT_AMBULATORY_CARE_PROVIDER_SITE_OTHER): Payer: Managed Care, Other (non HMO) | Admitting: Physician Assistant

## 2022-12-05 VITALS — BP 132/80 | HR 88 | Temp 97.7°F | Ht 71.0 in | Wt 225.0 lb

## 2022-12-05 DIAGNOSIS — I2699 Other pulmonary embolism without acute cor pulmonale: Secondary | ICD-10-CM | POA: Diagnosis not present

## 2022-12-05 DIAGNOSIS — M130 Polyarthritis, unspecified: Secondary | ICD-10-CM | POA: Insufficient documentation

## 2022-12-05 MED ORDER — TRIAMCINOLONE ACETONIDE 40 MG/ML IJ SUSP
80.0000 mg | Freq: Once | INTRAMUSCULAR | Status: AC
Start: 2022-12-05 — End: 2022-12-05
  Administered 2022-12-05: 80 mg via INTRAMUSCULAR

## 2022-12-05 MED ORDER — HYDROCODONE-ACETAMINOPHEN 10-325 MG PO TABS
1.0000 | ORAL_TABLET | Freq: Three times a day (TID) | ORAL | 0 refills | Status: AC | PRN
Start: 2022-12-05 — End: 2022-12-10

## 2022-12-05 MED ORDER — EDOXABAN TOSYLATE 60 MG PO TABS
60.0000 mg | ORAL_TABLET | Freq: Every day | ORAL | 3 refills | Status: DC
Start: 2022-12-05 — End: 2023-01-09

## 2022-12-05 NOTE — Progress Notes (Signed)
Subjective:  Patient ID: Jacob Collier, male    DOB: [birth year]  Age: 67 y.o. MRN: [id number]  Chief Complaint  Patient presents with   Hospitalization Follow-up    Back pain    HPI   Patient present in office today for hospital follow up. Patient was seen at [facility] ED. Saturday was last day he took blood thinner. States that he has had full body joint pain and that nothing has helped with the pain. States that he also continues to have a cough and that it is not getting any better. Denies fever, chills, or sinus congestion. Admits to the cough being dry and waking him up at night. States that he has discontinued all his medications besides taking an aspirin and tylenol. States that he had a similar issue with statins and that is why he had to discontinue using them because they all caused him major joint pain. Patient states that he has tried prednisone before to help with joint pain but that it caused him stomach pain. States he went to the ED because he had severe shoulder pain in his right shoulder and that when the ambulance got there his bp was 250/120. They gave him pain medicine at the ED and after one night there he began feeling better and was released. They told him that even though his troponin was elevated it was not high enough to show he was having a MI. Patient would like to change his anti coagulant due to him still having a cough and joint pain.      11/03/2022   10:40 AM 07/27/2022    3:21 PM 11/20/2020   11:03 AM 10/26/2020    1:52 PM 05/19/2020    1:40 PM  Depression screen PHQ 2/9  Decreased Interest 0 0 0 0 0  Down, Depressed, Hopeless 0 0 0 0 0  PHQ - 2 Score 0 0 0 0 0  Altered sleeping 0 0     Tired, decreased energy 1 1     Change in appetite 0 0     Feeling bad or failure about yourself  0 0     Trouble concentrating 0 0     Moving slowly or fidgety/restless 0 0     Suicidal thoughts 0 0     PHQ-9 Score 1 1     Difficult doing work/chores Not difficult at  all Not difficult at all           11/03/2022   10:40 AM  Fall Risk   Falls in the past year? 0  Number falls in past yr: 0  Injury with Fall? 0  Follow up Falls evaluation completed    Patient Care Team: Blane Ohara, MD as PCP - General (Family Medicine) Lennette Bihari, MD as PCP - Cardiology (Cardiology) Mealor, Roberts Gaudy, MD as PCP - Electrophysiology (Cardiology) Crista Elliot, MD as Consulting Physician (Urology) Smitty Knudsen as Physician Assistant (Cardiology)   Review of Systems  Constitutional:  Positive for fatigue.  HENT:  Negative for congestion, ear pain and sore throat.   Respiratory:  Positive for shortness of breath. Negative for cough.   Cardiovascular:  Negative for chest pain.  Gastrointestinal:  Negative for abdominal pain, constipation, diarrhea, nausea and vomiting.  Genitourinary:  Negative for dysuria, frequency and urgency.  Musculoskeletal:  Positive for arthralgias and myalgias. Negative for back pain.  Neurological:  Positive for headaches. Negative for dizziness.  Psychiatric/Behavioral:  Negative for  agitation and sleep disturbance. The patient is not nervous/anxious.     Current Outpatient Medications on File Prior to Visit  Medication Sig Dispense Refill   amLODipine (NORVASC) 5 MG tablet Take 1 tablet (5 mg) by mouth every morning & take 0.5 tablet (2.5 mg) by mouth at night. 135 tablet 1   aspirin EC 81 MG tablet Take 81 mg by mouth daily. Swallow whole.     clonazePAM (KLONOPIN) 1 MG tablet TAKE 1 TABLET BY MOUTH TWICE A DAY AS NEEDED FOR ANXIETY (Patient taking differently: Take 1 mg by mouth 2 (two) times daily. TAKE 1 TABLET BY MOUTH TWICE A DAY AS NEEDED FOR ANXIETY) 60 tablet 3   Eszopiclone 3 MG TABS Take 1 tablet (3 mg total) by mouth daily as needed (sleep). 30 tablet 0   Evolocumab (REPATHA SURECLICK) 140 MG/ML SOAJ INJECT 140 MG INTO THE SKIN EVERY 14 (FOURTEEN) DAYS. 6 mL 3   ezetimibe (ZETIA) 10 MG tablet TAKE 1  TABLET BY MOUTH EVERY DAY 90 tablet 3   loratadine (CLARITIN) 10 MG tablet Take 1 tablet (10 mg total) by mouth daily. 30 tablet 11   Omega-3 Fatty Acids (OMEGA 3 PO) Take 500 mg by mouth every morning.     Phenylephrine HCl (AFRIN ALLERGY NA) Place 2 sprays into the nose 2 (two) times daily.     No current facility-administered medications on file prior to visit.   Past Medical History:  Diagnosis Date   Anxiety    Arthritis    Coronary artery disease    a. S/p DES to RCA in 01/2019 b. s/p CABG x4 (LIMA-LAD, reverse SVG-PDA, reverse SVG-OM1, reverse SVG-1st Diag) in 01/2021   Diverticulosis    Dyspnea    Hard of hearing    Hyperlipidemia    Hypertension    hx of elevation on lyrica, off med and now normal    Paroxysmal SVT (supraventricular tachycardia)    Post-op atrial fibrillation    Prostate cancer (HCC)    Wears glasses    Past Surgical History:  Procedure Laterality Date   ANTERIOR CERVICAL DECOMP/DISCECTOMY FUSION N/A 09/03/2021   Procedure: CERVICAL FIVE-SIX, CERVICAL SIX-SEVEN ANTERIOR CERVICAL DECOMPRESSION/DISCECTOMY FUSION;  Surgeon: Julio Sicks, MD;  Location: [facility] OR;  Service: Neurosurgery;  Laterality: N/A;   CARDIAC CATHETERIZATION     with 2 stents    COLECTOMY  05/2018   COLONOSCOPY     CORONARY ARTERY BYPASS GRAFT N/A 01/19/2021   Procedure: CORONARY ARTERY BYPASS GRAFTING (CABG), ON PUMP TIMES FOUR, USING LEFT INTERNAL MAMMARY ARTERY AND ENDOSOCPICALLY HARVESTED RIGHT GREATER SAPHENOUS VEIN;  Surgeon: Corliss Skains, MD;  Location: [facility] OR;  Service: Open Heart Surgery;  Laterality: N/A;   CYSTOSCOPY WITH URETHRAL DILATATION N/A 01/14/2021   Procedure: CYSTOSCOPY WITH BALLOON  DILATATION;  Surgeon: Alfredo Martinez, MD;  Location: [facility] ORS;  Service: Urology;  Laterality: N/A;   EYE SURGERY  02/2020   bilaterl cataracts   IR THORACENTESIS ASP PLEURAL SPACE W/IMG GUIDE  02/02/2021   LEFT HEART CATH AND CORONARY ANGIOGRAPHY N/A 12/24/2020   Procedure: LEFT  HEART CATH AND CORONARY ANGIOGRAPHY;  Surgeon: Swaziland, Peter M, MD;  Location: [facility] INVASIVE CV LAB;  Service: Cardiovascular;  Laterality: N/A;   PROSTATECTOMY     SVT ABLATION N/A 10/20/2022   Procedure: SVT ABLATION;  Surgeon: Maurice Small, MD;  Location: [facility] INVASIVE CV LAB;  Service: Cardiovascular;  Laterality: N/A;   TEE WITHOUT CARDIOVERSION N/A 01/19/2021   Procedure: TRANSESOPHAGEAL ECHOCARDIOGRAM (TEE);  Surgeon: Corliss Skains, MD;  Location: [facility] OR;  Service: Open Heart Surgery;  Laterality: N/A;   TOTAL HIP ARTHROPLASTY Left 11/10/2015   Procedure: LEFT TOTAL HIP ARTHROPLASTY ANTERIOR APPROACH;  Surgeon: Kathryne Hitch, MD;  Location: [facility] OR;  Service: Orthopedics;  Laterality: Left;    Family History  Problem Relation Age of Onset   Alzheimer's disease Mother    Stroke Father    Diabetes Sister    Heart attack Maternal Grandmother    Cancer Maternal Uncle        prostate   Prostate cancer Maternal Uncle    Cancer Maternal Uncle        prostate   Prostate cancer Maternal Uncle    Colon polyps Brother    Colon cancer Neg Hx    Esophageal cancer Neg Hx    Rectal cancer Neg Hx    Stomach cancer Neg Hx    Social History   Socioeconomic History   Marital status: Married    Spouse name: Not on file   Number of children: 0   Years of education: Not on file   Highest education level: Not on file  Occupational History   Occupation: Employed at [employer]  Tobacco Use   Smoking status: Never   Smokeless tobacco: Never  Vaping Use   Vaping status: Never Used  Substance and Sexual Activity   Alcohol use: Not Currently    Alcohol/week: 0.0 standard drinks of alcohol    Comment: rarely   Drug use: No   Sexual activity: Yes    Partners: Female    Birth control/protection: None  Other Topics Concern   Not on file  Social History Narrative   Not on file   Social Determinants of Health   Financial Resource Strain: Low Risk  (08/04/2022)   Overall  Financial Resource Strain (CARDIA)    Difficulty of Paying Living Expenses: Not hard at all  Food Insecurity: No Food Insecurity (10/29/2022)   Hunger Vital Sign    Worried About Running Out of Food in the Last Year: Never true    Ran Out of Food in the Last Year: Never true  Transportation Needs: No Transportation Needs (10/29/2022)   PRAPARE - Administrator, Civil Service (Medical): No    Lack of Transportation (Non-Medical): No  Physical Activity: Sufficiently Active (08/04/2022)   Exercise Vital Sign    Days of Exercise per Week: 5 days    Minutes of Exercise per Session: 60 min  Stress: No Stress Concern Present (08/04/2022)   [employer] of Occupational Health - Occupational Stress Questionnaire    Feeling of Stress : Not at all  Social Connections: Moderately Isolated (08/04/2022)   Social Connection and Isolation Panel [NHANES]    Frequency of Communication with Friends and Family: More than three times a week    Frequency of Social Gatherings with Friends and Family: More than three times a week    Attends Religious Services: Never    Database administrator or Organizations: No    Attends Engineer, structural: Never    Marital Status: Married    Objective:  BP 132/80 (BP Location: Left Arm, Patient Position: Sitting, Cuff Size: Large)   Pulse 88   Temp 97.7 F (36.5 C) (Temporal)   Ht 5\' 11"  (1.803 m)   Wt 225 lb (102.1 kg)   SpO2 95%   BMI 31.38 kg/m      12/05/2022    3:28 PM  12/04/2022   12:00 PM 12/04/2022   11:28 AM  BP/Weight  Systolic BP 132 139 122  Diastolic BP 80 76 56  Wt. (Lbs) 225    BMI 31.38 kg/m2      Physical Exam Vitals reviewed.  Constitutional:      Appearance: Normal appearance.  Cardiovascular:     Rate and Rhythm: Normal rate and regular rhythm.     Heart sounds: Normal heart sounds.  Pulmonary:     Effort: Pulmonary effort is normal.     Breath sounds: Normal breath sounds.  Abdominal:     General: Bowel  sounds are normal.     Palpations: Abdomen is soft.     Tenderness: There is no abdominal tenderness.  Musculoskeletal:        General: Tenderness present.     Right elbow: Tenderness present.     Left elbow: Tenderness present.     Right wrist: Tenderness present. No swelling or effusion.     Left wrist: Tenderness present. No swelling or effusion.     Right hip: Tenderness present.     Left hip: Tenderness present.     Right knee: Tenderness present.     Left knee: Tenderness present.  Neurological:     Mental Status: He is alert and oriented to person, place, and time.  Psychiatric:        Mood and Affect: Mood normal.        Behavior: Behavior normal.     Diabetic Foot Exam - Simple   No data filed      Lab Results  Component Value Date   WBC 9.9 12/04/2022   HGB 15.6 12/04/2022   HCT 46.0 12/04/2022   PLT 185 12/04/2022   GLUCOSE 131 (H) 12/04/2022   CHOL 86 (L) 11/23/2022   TRIG 150 (H) 11/23/2022   HDL 33 (L) 11/23/2022   LDLCALC 27 11/23/2022   ALT 29 12/04/2022   AST 22 12/04/2022   NA 138 12/04/2022   K 4.0 12/04/2022   CL 108 12/04/2022   CREATININE 1.09 12/04/2022   BUN 11 12/04/2022   CO2 20 (L) 12/04/2022   TSH 1.570 04/04/2022   INR 1.0 12/04/2022   HGBA1C 5.8 (H) 03/04/2021      Assessment & Plan:    Polyarticular arthritis Assessment & Plan: Given Kenalog 80mg  injection IM Prescribed Hydrocodone-acetaminophen 10-325mg  to help with pain  Will adjust treatment depending on labs  Orders: -     Sedimentation rate -     C-reactive protein -     CK -     Rheumatoid factor -     CYCLIC CITRUL PEPTIDE ANTIBODY, IGG/IGA -     ANA w/Reflex -     Uric acid -     Parvovirus B19 antibody, IgG and IgM -     Triamcinolone Acetonide -     HYDROcodone-Acetaminophen; Take 1 tablet by mouth every 8 (eight) hours as needed for up to 5 days.  Dispense: 15 tablet; Refill: 0  Other acute pulmonary embolism without acute cor pulmonale (HCC) Assessment  & Plan: No sign of clot on CT done on 12/03/2022 Prescribed Savaysa 60mg  Continue to monitor symptoms Will adjust treatment as needed  Orders: -     Edoxaban Tosylate; Take 60 mg by mouth daily.  Dispense: 30 tablet; Refill: 3     Meds ordered this encounter  Medications   triamcinolone acetonide (KENALOG-40) injection 80 mg   edoxaban (SAVAYSA) 60 MG TABS tablet  Sig: Take 60 mg by mouth daily.    Dispense:  30 tablet    Refill:  3   HYDROcodone-acetaminophen (NORCO) 10-325 MG tablet    Sig: Take 1 tablet by mouth every 8 (eight) hours as needed for up to 5 days.    Dispense:  15 tablet    Refill:  0    Orders Placed This Encounter  Procedures   Sedimentation Rate   C-reactive protein   CK   Rheumatoid factor   CYCLIC CITRUL PEPTIDE ANTIBODY, IGG/IGA   ANA w/Reflex   Uric acid   Parvovirus B19 antibody, IgG and IgM     Follow-up: No follow-ups on file.   I,Jacqua L Marsh,acting as a scribe for [employer], PA.,have documented all relevant documentation on the behalf of Langley Gauss, PA,as directed by  Langley Gauss, PA while in the presence of Langley Gauss, [name].   An After Visit Summary was printed and given to the patient.  Langley Gauss, [name] [facility] [phone]

## 2022-12-05 NOTE — Assessment & Plan Note (Signed)
Given Kenalog 80mg  injection IM Prescribed Hydrocodone-acetaminophen 10-325mg  to help with pain  Will adjust treatment depending on labs

## 2022-12-05 NOTE — Assessment & Plan Note (Signed)
No sign of clot on CT done on 12/03/2022 Prescribed Savaysa 60mg  Continue to monitor symptoms Will adjust treatment as needed

## 2022-12-06 ENCOUNTER — Telehealth (HOSPITAL_COMMUNITY): Payer: Self-pay

## 2022-12-06 LAB — C-REACTIVE PROTEIN: CRP: 111 mg/L — ABNORMAL HIGH (ref 0–10)

## 2022-12-06 LAB — CK: Total CK: 191 U/L (ref 41–331)

## 2022-12-06 LAB — SEDIMENTATION RATE: Sed Rate: 20 mm/h (ref 0–30)

## 2022-12-07 ENCOUNTER — Ambulatory Visit: Payer: Managed Care, Other (non HMO) | Admitting: Physician Assistant

## 2022-12-07 ENCOUNTER — Telehealth: Payer: Self-pay

## 2022-12-07 LAB — CYCLIC CITRUL PEPTIDE ANTIBODY, IGG/IGA: Cyclic Citrullin Peptide Ab: 6 U (ref 0–19)

## 2022-12-07 LAB — PARVOVIRUS B19 ANTIBODY, IGG AND IGM
Parvovirus B19 IgG: 2.8 {index} — ABNORMAL HIGH (ref 0.0–0.8)
Parvovirus B19 IgM: 0.2 {index} (ref 0.0–0.8)

## 2022-12-07 LAB — RHEUMATOID FACTOR: Rheumatoid fact SerPl-aCnc: 17.4 [IU]/mL — ABNORMAL HIGH (ref <14.0)

## 2022-12-07 LAB — URIC ACID: Uric Acid: 5.7 mg/dL (ref 3.8–8.4)

## 2022-12-07 LAB — ANA W/REFLEX: Anti Nuclear Antibody (ANA): NEGATIVE

## 2022-12-07 NOTE — Telephone Encounter (Signed)
PA submitted and approved via covermymeds for Savaysa 60 mg daily.

## 2022-12-08 ENCOUNTER — Ambulatory Visit: Payer: Managed Care, Other (non HMO) | Admitting: Physician Assistant

## 2022-12-08 ENCOUNTER — Encounter: Payer: Self-pay | Admitting: Physician Assistant

## 2022-12-08 VITALS — BP 130/62 | HR 63 | Temp 97.4°F | Ht 71.0 in | Wt 225.0 lb

## 2022-12-08 DIAGNOSIS — R0683 Snoring: Secondary | ICD-10-CM | POA: Diagnosis not present

## 2022-12-08 DIAGNOSIS — M05742 Rheumatoid arthritis with rheumatoid factor of left hand without organ or systems involvement: Secondary | ICD-10-CM | POA: Diagnosis not present

## 2022-12-08 DIAGNOSIS — M05741 Rheumatoid arthritis with rheumatoid factor of right hand without organ or systems involvement: Secondary | ICD-10-CM | POA: Diagnosis not present

## 2022-12-08 DIAGNOSIS — M2559 Pain in other specified joint: Secondary | ICD-10-CM | POA: Diagnosis not present

## 2022-12-08 LAB — POCT INFLUENZA A/B
Influenza A, POC: NEGATIVE
Influenza B, POC: NEGATIVE

## 2022-12-08 LAB — POC COVID19 BINAXNOW: SARS Coronavirus 2 Ag: NEGATIVE

## 2022-12-08 NOTE — Progress Notes (Signed)
Acute Office Visit  Subjective:    Patient ID: Jacob Collier, male    DOB: 1956-02-15, 67 y.o.   MRN: [id number]  Chief Complaint  Patient presents with   Not feeling better    HPI: Patient is in today for follow up. States he just got his rx for savaysa. States he still feels "bad" no longer in pain between the pain medication and kenalog pain has subsided. States he feels tired. Patient states that he has been excessively tired still and that is what is getting him the most.   Get pulmonologist or sleep specialist to help him get his CPAP figured out. States that he right now has the nasal canula that is pushing too much air and he has difficulty breathing and sleeping. Would like to meet with someone to help him get the settings correct and the right mask.  Send to rheumatologist to work on RA treatment and monitoring.   Past Medical History:  Diagnosis Date   Anxiety    Arthritis    Coronary artery disease    a. S/p DES to RCA in 01/2019 b. s/p CABG x4 (LIMA-LAD, reverse SVG-PDA, reverse SVG-OM1, reverse SVG-1st Diag) in 01/2021   Diverticulosis    Dyspnea    Hard of hearing    Hyperlipidemia    Hypertension    hx of elevation on lyrica, off med and now normal    Paroxysmal SVT (supraventricular tachycardia) (HCC)    Post-op atrial fibrillation    Prostate cancer (HCC)    Wears glasses     Past Surgical History:  Procedure Laterality Date   ANTERIOR CERVICAL DECOMP/DISCECTOMY FUSION N/A 09/03/2021   Procedure: CERVICAL FIVE-SIX, CERVICAL SIX-SEVEN ANTERIOR CERVICAL DECOMPRESSION/DISCECTOMY FUSION;  Surgeon: Julio Sicks, MD;  Location: [facility] OR;  Service: Neurosurgery;  Laterality: N/A;   CARDIAC CATHETERIZATION     with 2 stents    COLECTOMY  05/2018   COLONOSCOPY     CORONARY ARTERY BYPASS GRAFT N/A 01/19/2021   Procedure: CORONARY ARTERY BYPASS GRAFTING (CABG), ON PUMP TIMES FOUR, USING LEFT INTERNAL MAMMARY ARTERY AND ENDOSOCPICALLY HARVESTED RIGHT GREATER  SAPHENOUS VEIN;  Surgeon: Corliss Skains, MD;  Location: [facility] OR;  Service: Open Heart Surgery;  Laterality: N/A;   CYSTOSCOPY WITH URETHRAL DILATATION N/A 01/14/2021   Procedure: CYSTOSCOPY WITH BALLOON  DILATATION;  Surgeon: Alfredo Martinez, MD;  Location: [facility];  Service: Urology;  Laterality: N/A;   EYE SURGERY  02/2020   bilaterl cataracts   IR THORACENTESIS ASP PLEURAL SPACE W/IMG GUIDE  02/02/2021   LEFT HEART CATH AND CORONARY ANGIOGRAPHY N/A 12/24/2020   Procedure: LEFT HEART CATH AND CORONARY ANGIOGRAPHY;  Surgeon: Swaziland, Peter M, MD;  Location: [facility] INVASIVE CV LAB;  Service: Cardiovascular;  Laterality: N/A;   PROSTATECTOMY     SVT ABLATION N/A 10/20/2022   Procedure: SVT ABLATION;  Surgeon: Maurice Small, MD;  Location: [facility] INVASIVE CV LAB;  Service: Cardiovascular;  Laterality: N/A;   TEE WITHOUT CARDIOVERSION N/A 01/19/2021   Procedure: TRANSESOPHAGEAL ECHOCARDIOGRAM (TEE);  Surgeon: Corliss Skains, MD;  Location: [facility] OR;  Service: Open Heart Surgery;  Laterality: N/A;   TOTAL HIP ARTHROPLASTY Left 11/10/2015   Procedure: LEFT TOTAL HIP ARTHROPLASTY ANTERIOR APPROACH;  Surgeon: Kathryne Hitch, MD;  Location: [facility] OR;  Service: Orthopedics;  Laterality: Left;    Family History  Problem Relation Age of Onset   Alzheimer's disease Mother    Stroke Father    Diabetes Sister    Heart  attack Maternal Grandmother    Cancer Maternal Uncle        prostate   Prostate cancer Maternal Uncle    Cancer Maternal Uncle        prostate   Prostate cancer Maternal Uncle    Colon polyps Brother    Colon cancer Neg Hx    Esophageal cancer Neg Hx    Rectal cancer Neg Hx    Stomach cancer Neg Hx     Social History   Socioeconomic History   Marital status: Married    Spouse name: Not on file   Number of children: 0   Years of education: Not on file   Highest education level: Not on file  Occupational History   Occupation: Employed at [employer]  Tobacco Use    Smoking status: Never   Smokeless tobacco: Never  Vaping Use   Vaping status: Never Used  Substance and Sexual Activity   Alcohol use: Not Currently    Alcohol/week: 0.0 standard drinks of alcohol    Comment: rarely   Drug use: No   Sexual activity: Yes    Partners: Female    Birth control/protection: None  Other Topics Concern   Not on file  Social History Narrative   Not on file   Social Determinants of Health   Financial Resource Strain: Low Risk  (08/04/2022)   Overall Financial Resource Strain (CARDIA)    Difficulty of Paying Living Expenses: Not hard at all  Food Insecurity: No Food Insecurity (10/29/2022)   Hunger Vital Sign    Worried About Running Out of Food in the Last Year: Never true    Ran Out of Food in the Last Year: Never true  Transportation Needs: No Transportation Needs (10/29/2022)   PRAPARE - Administrator, Civil Service (Medical): No    Lack of Transportation (Non-Medical): No  Physical Activity: Sufficiently Active (08/04/2022)   Exercise Vital Sign    Days of Exercise per Week: 5 days    Minutes of Exercise per Session: 60 min  Stress: No Stress Concern Present (08/04/2022)   [employer] of Occupational Health - Occupational Stress Questionnaire    Feeling of Stress : Not at all  Social Connections: Moderately Isolated (08/04/2022)   Social Connection and Isolation Panel [NHANES]    Frequency of Communication with Friends and Family: More than three times a week    Frequency of Social Gatherings with Friends and Family: More than three times a week    Attends Religious Services: Never    Database administrator or Organizations: No    Attends Banker Meetings: Never    Marital Status: Married  Catering manager Violence: Not At Risk (10/29/2022)   Humiliation, Afraid, Rape, and Kick questionnaire    Fear of Current or Ex-Partner: No    Emotionally Abused: No    Physically Abused: No    Sexually Abused: No     Outpatient Medications Prior to Visit  Medication Sig Dispense Refill   amLODipine (NORVASC) 5 MG tablet Take 1 tablet (5 mg) by mouth every morning & take 0.5 tablet (2.5 mg) by mouth at night. 135 tablet 1   aspirin EC 81 MG tablet Take 81 mg by mouth daily. Swallow whole.     clonazePAM (KLONOPIN) 1 MG tablet TAKE 1 TABLET BY MOUTH TWICE A DAY AS NEEDED FOR ANXIETY (Patient taking differently: Take 1 mg by mouth 2 (two) times daily. TAKE 1 TABLET BY MOUTH TWICE A  DAY AS NEEDED FOR ANXIETY) 60 tablet 3   edoxaban (SAVAYSA) 60 MG TABS tablet Take 60 mg by mouth daily. 30 tablet 3   Eszopiclone 3 MG TABS Take 1 tablet (3 mg total) by mouth daily as needed (sleep). 30 tablet 0   Evolocumab (REPATHA SURECLICK) 140 MG/ML SOAJ INJECT 140 MG INTO THE SKIN EVERY 14 (FOURTEEN) DAYS. 6 mL 3   ezetimibe (ZETIA) 10 MG tablet TAKE 1 TABLET BY MOUTH EVERY DAY 90 tablet 3   HYDROcodone-acetaminophen (NORCO) 10-325 MG tablet Take 1 tablet by mouth every 8 (eight) hours as needed for up to 5 days. 15 tablet 0   loratadine (CLARITIN) 10 MG tablet Take 1 tablet (10 mg total) by mouth daily. 30 tablet 11   Omega-3 Fatty Acids (OMEGA 3 PO) Take 500 mg by mouth every morning.     Phenylephrine HCl (AFRIN ALLERGY NA) Place 2 sprays into the nose 2 (two) times daily.     No facility-administered medications prior to visit.    Allergies  Allergen Reactions   Ranolazine Other (See Comments)    Chest discomfort/Reflux   Amoxicillin Hives   Atorvastatin Other (See Comments)    myalgias  Other reaction(s): Myalgias (intolerance)   Pravastatin Other (See Comments)    myalgias  Other reaction(s): Myalgias (intolerance)   Rosuvastatin Other (See Comments)    myalgias  Other reaction(s): Myalgias (intolerance)   Statins Other (See Comments)    myalgias    Review of Systems  Constitutional:  Negative for chills, fatigue and fever.  Respiratory:  Positive for shortness of breath. Negative for cough.    Cardiovascular:  Negative for chest pain.       Objective:        12/08/2022    3:53 PM 12/05/2022    3:28 PM 12/04/2022   12:00 PM  Vitals with BMI  Height 5\' 11"  5\' 11"    Weight 225 lbs 225 lbs   BMI 31.39 31.39   Systolic 130 132 161  Diastolic 62 80 76  Pulse 63 88 56    No data found.   Physical Exam Vitals reviewed.  Constitutional:      Appearance: Normal appearance.  Cardiovascular:     Rate and Rhythm: Normal rate and regular rhythm.     Heart sounds: Normal heart sounds.  Pulmonary:     Effort: Pulmonary effort is normal.     Breath sounds: Normal breath sounds.  Abdominal:     General: Bowel sounds are normal.     Palpations: Abdomen is soft.     Tenderness: There is no abdominal tenderness.  Neurological:     Mental Status: He is alert and oriented to person, place, and time.  Psychiatric:        Mood and Affect: Mood normal.        Behavior: Behavior normal.     Health Maintenance Due  Topic Date Due   Medicare Annual Wellness (AWV)  Never done   DTaP/Tdap/Td (1 - Tdap) Never done   Zoster Vaccines- Shingrix (1 of 2) Never done   INFLUENZA VACCINE  10/06/2022    There are no preventive care reminders to display for this patient.   Lab Results  Component Value Date   TSH 1.570 04/04/2022   Lab Results  Component Value Date   WBC 9.9 12/04/2022   HGB 15.6 12/04/2022   HCT 46.0 12/04/2022   MCV 86.1 12/04/2022   PLT 185 12/04/2022   Lab Results  Component  Value Date   NA 138 12/04/2022   K 4.0 12/04/2022   CO2 20 (L) 12/04/2022   GLUCOSE 131 (H) 12/04/2022   BUN 11 12/04/2022   CREATININE 1.09 12/04/2022   BILITOT 0.3 12/04/2022   ALKPHOS 58 12/04/2022   AST 22 12/04/2022   ALT 29 12/04/2022   PROT 6.0 (L) 12/04/2022   ALBUMIN 3.3 (L) 12/04/2022   CALCIUM 8.5 (L) 12/04/2022   ANIONGAP 10 12/04/2022   EGFR 73 11/23/2022   Lab Results  Component Value Date   CHOL 86 (L) 11/23/2022   Lab Results  Component Value Date    HDL 33 (L) 11/23/2022   Lab Results  Component Value Date   LDLCALC 27 11/23/2022   Lab Results  Component Value Date   TRIG 150 (H) 11/23/2022   Lab Results  Component Value Date   CHOLHDL 2.6 11/23/2022   Lab Results  Component Value Date   HGBA1C 5.8 (H) 03/04/2021       Assessment & Plan:  There are no diagnoses linked to this encounter.   No orders of the defined types were placed in this encounter.   No orders of the defined types were placed in this encounter.    Follow-up: No follow-ups on file.  An After Visit Summary was printed and given to the patient.  Langley Gauss, [name] [facility] [phone]

## 2022-12-09 ENCOUNTER — Telehealth: Payer: Self-pay | Admitting: Hematology and Oncology

## 2022-12-12 ENCOUNTER — Other Ambulatory Visit: Payer: Self-pay | Admitting: Physician Assistant

## 2022-12-12 DIAGNOSIS — F5101 Primary insomnia: Secondary | ICD-10-CM

## 2022-12-13 ENCOUNTER — Other Ambulatory Visit: Payer: Self-pay | Admitting: Cardiovascular Disease

## 2022-12-13 ENCOUNTER — Other Ambulatory Visit: Payer: Self-pay

## 2022-12-13 ENCOUNTER — Telehealth: Payer: Self-pay | Admitting: Family Medicine

## 2022-12-13 ENCOUNTER — Other Ambulatory Visit: Payer: Self-pay | Admitting: Family Medicine

## 2022-12-13 DIAGNOSIS — F5101 Primary insomnia: Secondary | ICD-10-CM

## 2022-12-13 NOTE — Telephone Encounter (Signed)
ERROR

## 2022-12-14 ENCOUNTER — Other Ambulatory Visit: Payer: Self-pay

## 2022-12-14 ENCOUNTER — Encounter: Payer: Self-pay | Admitting: Physician Assistant

## 2022-12-15 ENCOUNTER — Ambulatory Visit: Payer: Managed Care, Other (non HMO) | Attending: Cardiovascular Disease | Admitting: Cardiovascular Disease

## 2022-12-15 ENCOUNTER — Encounter: Payer: Self-pay | Admitting: Cardiovascular Disease

## 2022-12-15 ENCOUNTER — Other Ambulatory Visit: Payer: Self-pay | Admitting: Physician Assistant

## 2022-12-15 VITALS — BP 160/90 | HR 68 | Ht 71.0 in | Wt 222.4 lb

## 2022-12-15 DIAGNOSIS — I2510 Atherosclerotic heart disease of native coronary artery without angina pectoris: Secondary | ICD-10-CM

## 2022-12-15 DIAGNOSIS — I10 Essential (primary) hypertension: Secondary | ICD-10-CM

## 2022-12-15 DIAGNOSIS — I2699 Other pulmonary embolism without acute cor pulmonale: Secondary | ICD-10-CM

## 2022-12-15 DIAGNOSIS — Z951 Presence of aortocoronary bypass graft: Secondary | ICD-10-CM | POA: Diagnosis not present

## 2022-12-15 DIAGNOSIS — J301 Allergic rhinitis due to pollen: Secondary | ICD-10-CM

## 2022-12-15 DIAGNOSIS — E785 Hyperlipidemia, unspecified: Secondary | ICD-10-CM

## 2022-12-15 DIAGNOSIS — I471 Supraventricular tachycardia, unspecified: Secondary | ICD-10-CM | POA: Diagnosis not present

## 2022-12-15 NOTE — Progress Notes (Signed)
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 06/06/2019 0612   LDLCALC 27 11/23/2022 1439   LABVLDL 26 11/23/2022 1439     RADIOLOGY: CT Angio Chest/Abd/Pel for Dissection W and/or W/WO  Result Date: 12/04/2022 CLINICAL DATA:  Severe back pain for 3-4 hours acute aortic syndrome suspected, history of prostate cancer status post prostatectomy * Tracking Code: BO * EXAM: CT ANGIOGRAPHY CHEST, ABDOMEN AND PELVIS TECHNIQUE: Non-contrast CT of the chest was initially obtained. Multidetector CT imaging through the chest, abdomen and pelvis was performed using the standard protocol during bolus administration of intravenous contrast. Multiplanar reconstructed images and MIPs were obtained and reviewed to evaluate the vascular anatomy. RADIATION DOSE REDUCTION: This exam was performed according to the departmental dose-optimization program which includes automated exposure control, adjustment of the mA and/or kV according to patient size and/or use of iterative reconstruction technique. CONTRAST:  OMNIPAQUE IOHEXOL 350 MG/ML SOLN COMPARISON:  CT chest angiogram, 10/29/2022, CT abdomen pelvis, 09/21/2022 FINDINGS: CTA CHEST  FINDINGS VASCULAR Aorta: Satisfactory opacification of the aorta. Normal contour and caliber of the thoracic aorta. No evidence of aneurysm, dissection, or other acute aortic pathology. Cardiovascular: No evidence of pulmonary embolism on limited non-tailored examination. Cardiomegaly. Left and right coronary artery calcifications status post median sternotomy and CABG. No pericardial effusion. Review of the MIP images confirms the above findings. NON VASCULAR Mediastinum/Nodes: No enlarged mediastinal, hilar, or axillary lymph nodes. Thyroid gland, trachea, and esophagus demonstrate no significant findings. Lungs/Pleura: Lungs are clear. No pleural effusion or pneumothorax. Musculoskeletal: No chest wall abnormality. No acute osseous findings. Review of the MIP images confirms the above findings. CTA ABDOMEN AND PELVIS FINDINGS VASCULAR Normal contour and caliber of the abdominal aorta. No evidence of aneurysm, dissection, or other acute aortic pathology. 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 Sharp impression of the median arcuate ligament on the origin of  the celiac axis (series 11, image 108). Review of the MIP images confirms the above findings. NON-VASCULAR Hepatobiliary: No solid liver abnormality is seen. Hepatic steatosis. No gallstones, gallbladder wall thickening, or biliary dilatation. Pancreas: Unremarkable. No pancreatic ductal dilatation or surrounding inflammatory changes. Spleen: Normal in size without significant abnormality. Adrenals/Urinary Tract: Adrenal glands are unremarkable. Small nonobstructive calculus of the inferior pole of the right kidney. No left-sided calculi, ureteral calculi, or hydronephrosis. Simple, benign right renal cortical cysts, for which no further follow-up or characterization is required. Bladder is unremarkable. Stomach/Bowel: Stomach is within normal limits. Appendix appears  normal. No evidence of bowel wall thickening, distention, or inflammatory changes. Sigmoid diverticulosis. Moderate burden of stool and stool balls throughout the colon and rectum. Lymphatic: No enlarged abdominal or pelvic lymph nodes. Reproductive: Status post prostatectomy. Other: No abdominal wall hernia or abnormality. No ascites. Musculoskeletal: No acute osseous findings. Status post left hip total arthroplasty. IMPRESSION: 1. Normal contour and caliber of the thoracic and abdominal aorta. No evidence of aneurysm, dissection, or other acute aortic pathology. 2. Sharp impression of the median arcuate ligament on the origin of the celiac axis, which may be seen in the setting of median arcuate ligament syndrome if clinically referable signs and symptoms are present. 3. No acute non-vascular findings of the chest, abdomen, or pelvis. 4. Cardiomegaly and coronary artery disease status post median sternotomy and CABG. 5. Hepatic steatosis. 6. Nonobstructive right nephrolithiasis. 7. Sigmoid diverticulosis. 8. Status post prostatectomy. Electronically Signed   By: Jearld Lesch M.D.   On: 12/04/2022 07:01   DG Chest Port 1 View  Result Date: 12/04/2022 CLINICAL DATA:  Chest and back pain EXAM: PORTABLE CHEST 1 VIEW COMPARISON:  10/30/2022 FINDINGS: Artifact from to limit tree leads overlies the left lower lung. Previous median sternotomy. Stable cardiac enlargement. No pleural fluid, interstitial edema or airspace disease. Pulmonary vascular congestion. No airspace opacities. Status post ACDF. IMPRESSION: Cardiomegaly and pulmonary vascular congestion. Electronically Signed   By: Signa Kell M.D.   On: 12/04/2022 05:24     Additional studies/ records that were reviewed today include:  I reviewed the patient's most recent hospitalization, and while in the hospital was able to review his outside angiographic films.    Prox LAD to Mid LAD lesion is 75% stenosed.   1st Diag lesion is 80% stenosed.   Prox  Cx to Mid Cx lesion is 100% stenosed.   Prox RCA to Mid RCA lesion is 100% stenosed.   The left ventricular systolic function is normal.   LV end diastolic pressure is mildly elevated.   The left ventricular ejection fraction is 50-55% by visual estimate.   There is no aortic valve stenosis.   3 vessel obstructive CAD. 75% mid LAD, 80% first diagonal, 100% mid LCx and 100% proximal RCA. Compared to prior cath in March 2021 the LAD stenosis is new.  Mild LV dysfunction with inferobasal HK. EF 50% Mildly elevated LVEDP   Plan: needs CT surgery evaluation for CABG. Will hold Plavix. Plan to refer to Pharm D for PCSK 9 inhibitor.      ASSESSMENT:    1. Coronary artery disease involving native coronary artery of native heart without angina pectoris   2. S/P CABG x 4   3. Primary hypertension   4. SVT (supraventricular tachycardia) (HCC)   5. Acute pulmonary embolism without acute cor pulmonale (HCC) s/p radiofrequency ablation   6. Hyperlipidemia with target LDL less than 70     PLAN:  Jacob Pacas  Collier is a 67 year-old gentleman who has known CAD and underwent initial PCI to his RCA in October 2020 at which time he also had known CTO of his circumflex.  He has a history of hypertension and hyperlipidemia and subsequently developed CTO of his RCA which upon angiographic review was not amenable for attempt at intervention.  He has been documented to have SVT which has improved with initiation of metoprolol succinate.  He believes his episodes have been anxiety mediated and since he had been placed on low-dose Klonopin he is not experiencing any tachydysrhythmia.  When I saw him in April 2022 he was not having recurrent anginal symptomatology but had experienced  some increase in shortness of breath when very active at work.  In the past, there is some issue regarding Ranexa intolerance.  He experienced myalgias with atorvastatin and on a trial of every other day rosuvastatin he also felt similar  symptoms and discontinued therapy.  He was on Zetia.  In addition, he subsequently had a trial of Livalo and also pravastatin.  Due to statin intolerance was subsequently started on PCSK9 inhibition with Repatha which he has tolerated.  Due to progressive CAD he underwent successful CABG revascularization in January 19 2021 by Dr. Cliffton Asters with a LIMA to LAD, SVG to PDA, SVG to diagonal and SVG to OM vessel.  He developed postoperative atrial fibrillation for which he was treated with amiodarone at 200 mg mg twice a day for 1 week and subsequently 200 mg daily.  He had undergone successful thoracentesis with removal of 2 L of pleural fluid following his CABG revascularization.  Due to development of recurrent SVT, he ultimately underwent radiofrequency ablation by Dr. Nelly Laurence on October 20, 2022 with ablation of the slow pathway of the AV node.  He subsequently developed increasing shortness of breath and was diagnosed with acute pulmonary embolism on hospitalization 1 week later.  Was felt most likely that the PE have been due to venous vascular access and stasis after his ablation and therefore would be considered provoked PVD.  He apparently could not tolerate Xarelto or Eliquis and is now on Savaysa (edoxaban) 60 mg daily.  He denies recurrent chest pain.  His breathing has improved.  Blood pressure today is stable on amlodipine 5 mg and metoprolol succinate 12.5 mg.  He continues to be on Zetia and Repatha.  He has issues with right knee discomfort.  He will be scheduled for follow-up CT imaging several months.    Medication Adjustments/Labs and Tests Ordered: Current medicines are reviewed at length with the patient today.  Concerns regarding medicines are outlined above.  Medication changes, Labs and Tests ordered today are listed in the Patient Instructions below. Patient Instructions  Medication Instructions:  No medication changes *If you need a refill on your cardiac medications before your next  appointment, please call your pharmacy*   Lab Work: No labs If you have labs (blood work) drawn today and your tests are completely normal, you will receive your results only by: MyChart Message (if you have MyChart) OR A paper copy in the mail If you have any lab test that is abnormal or we need to change your treatment, we will call you to review the results.   Testing/Procedures: None    Follow-Up: At [facility], you and your health needs are our priority.  As part of our continuing mission to provide you with exceptional heart care, we have created designated Provider Care Teams.  These Care  Teams include your primary Cardiologist (physician) and Advanced Practice Providers (APPs -  Physician Assistants and Nurse Practitioners) who all work together to provide you with the care you need, when you need it.  We recommend signing up for the patient portal called "MyChart".  Sign up information is provided on this After Visit Summary.  MyChart is used to connect with patients for Virtual Visits (Telemedicine).  Patients are able to view lab/test results, encounter notes, upcoming appointments, etc.  Non-urgent messages can be sent to your provider as well.   To learn more about what you can do with MyChart, go to [email or web address].    Your next appointment:   6-7 month(s)  Provider:   Nicki Guadalajara, MD      Signed, Nicki Guadalajara, MD  12/19/2022 3:29 PM    [facility] [address], [address], [place], [place]  [address] Phone: [phone]  8.I did okay

## 2022-12-15 NOTE — Patient Instructions (Signed)
Medication Instructions:  No medication changes *If you need a refill on your cardiac medications before your next appointment, please call your pharmacy*   Lab Work: No labs If you have labs (blood work) drawn today and your tests are completely normal, you will receive your results only by: MyChart Message (if you have MyChart) OR A paper copy in the mail If you have any lab test that is abnormal or we need to change your treatment, we will call you to review the results.   Testing/Procedures: None    Follow-Up: At [facility], you and your health needs are our priority.  As part of our continuing mission to provide you with exceptional heart care, we have created designated Provider Care Teams.  These Care Teams include your primary Cardiologist (physician) and Advanced Practice Providers (APPs -  Physician Assistants and Nurse Practitioners) who all work together to provide you with the care you need, when you need it.  We recommend signing up for the patient portal called "MyChart".  Sign up information is provided on this After Visit Summary.  MyChart is used to connect with patients for Virtual Visits (Telemedicine).  Patients are able to view lab/test results, encounter notes, upcoming appointments, etc.  Non-urgent messages can be sent to your provider as well.   To learn more about what you can do with MyChart, go to [email or web address].    Your next appointment:   6-7 month(s)  Provider:   Nicki Guadalajara, MD

## 2022-12-19 ENCOUNTER — Encounter: Payer: Self-pay | Admitting: Cardiovascular Disease

## 2022-12-23 NOTE — Telephone Encounter (Signed)
-----   Message from Langley Gauss sent at 12/23/2022 11:51 AM EDT ----- Regarding: Glyn Ade Can we call and see if he has been taking his blood thinner? Can we also look into his PA and see what we need to put in there to get him approved?

## 2022-12-26 ENCOUNTER — Ambulatory Visit (INDEPENDENT_AMBULATORY_CARE_PROVIDER_SITE_OTHER): Payer: Managed Care, Other (non HMO)

## 2022-12-26 VITALS — BP 118/62 | HR 63 | Temp 97.8°F | Ht 71.0 in | Wt 223.0 lb

## 2022-12-26 DIAGNOSIS — R0602 Shortness of breath: Secondary | ICD-10-CM | POA: Insufficient documentation

## 2022-12-26 DIAGNOSIS — Z86711 Personal history of pulmonary embolism: Secondary | ICD-10-CM | POA: Insufficient documentation

## 2022-12-26 LAB — POC COVID19 BINAXNOW: SARS Coronavirus 2 Ag: NEGATIVE

## 2022-12-26 LAB — POCT INFLUENZA A/B
Influenza A, POC: NEGATIVE
Influenza B, POC: NEGATIVE

## 2022-12-26 NOTE — Patient Instructions (Addendum)
The possibility of a blood clot in your lung is LOW TODAY But if you have any worsening breathing problems, call 911 Please do the chest x ray tomorrow Monitor your home Bps Follow up with Dr.Cox in 3-4 weeks

## 2022-12-26 NOTE — Assessment & Plan Note (Addendum)
Patient reports episode of shortness of breath and back pain yesterday which has improved today.  He was concerned due to his history of PE back in August. He is on anticoagulation, compliant with it. Physical exam is unremarkable except for few crackles in his lungs.  Plan: His Wells score for PE is low at 1.5%. Since he appears comfortable and does not appear to be in any distress, I do not feel the need for him to go to the emergency room Ordered chest x-ray in view of his crackles and history of pulmonary vascular congestion noted on prior x-rays He does understand to call 911 for any worsening symptoms I do not feel the need for any additional testing or blood work at this time. It is reassuring that he had negative flu and covid tests in office today.

## 2022-12-26 NOTE — Progress Notes (Signed)
Acute Office Visit  Subjective:    Patient ID: Jacob Collier, male    DOB: [birth year], 67 y.o.   MRN: [id number]  Chief Complaint  Patient presents with   shortness of breath    HPI: Patient is in today for Shortness of breath with mid back pain.  States he went to the emergency room back in September due to fear of recurrent blood clot.  He had a chest x-ray at that time which showed pulmonary vascular congestion and a CAT scan which was negative for recurrent PE  He reports a heart ablation for an arrhyhtmia on 10/20/22 and then had a PE a week after. Had excruciating SOB at that time. Was in hospital for 5 days then., eliquis gave him side effects and hence is on SAVAYSA.  STATES LAST NIGHT, the started to develop mild fever at 100 F, shortness of breath, He is worried about a recurrent clot in his lungs.  Denies any SOB at this time. Was worse yesterday. BP is elevated today  Compliant with the blood thinners.  Has not missed even a single dose of the medication.  Denies any recent immobilization.      Past Medical History:  Diagnosis Date   Anxiety    Arthritis    Coronary artery disease    a. S/p DES to RCA in 01/2019 b. s/p CABG x4 (LIMA-LAD, reverse SVG-PDA, reverse SVG-OM1, reverse SVG-1st Diag) in 01/2021   Diverticulosis    Dyspnea    Hard of hearing    Hyperlipidemia    Hypertension    hx of elevation on lyrica, off med and now normal    Paroxysmal SVT (supraventricular tachycardia) (HCC)    Post-op atrial fibrillation    Prostate cancer (HCC)    Wears glasses     Past Surgical History:  Procedure Laterality Date   ANTERIOR CERVICAL DECOMP/DISCECTOMY FUSION N/A 09/03/2021   Procedure: CERVICAL FIVE-SIX, CERVICAL SIX-SEVEN ANTERIOR CERVICAL DECOMPRESSION/DISCECTOMY FUSION;  Surgeon: Julio Sicks, MD;  Location: [facility] OR;  Service: Neurosurgery;  Laterality: N/A;   CARDIAC CATHETERIZATION     with 2 stents    COLECTOMY  05/2018   COLONOSCOPY      CORONARY ARTERY BYPASS GRAFT N/A 01/19/2021   Procedure: CORONARY ARTERY BYPASS GRAFTING (CABG), ON PUMP TIMES FOUR, USING LEFT INTERNAL MAMMARY ARTERY AND ENDOSOCPICALLY HARVESTED RIGHT GREATER SAPHENOUS VEIN;  Surgeon: Corliss Skains, MD;  Location: [facility] OR;  Service: Open Heart Surgery;  Laterality: N/A;   CYSTOSCOPY WITH URETHRAL DILATATION N/A 01/14/2021   Procedure: CYSTOSCOPY WITH BALLOON  DILATATION;  Surgeon: Alfredo Martinez, MD;  Location: [facility];  Service: Urology;  Laterality: N/A;   EYE SURGERY  02/2020   bilaterl cataracts   IR THORACENTESIS ASP PLEURAL SPACE W/IMG GUIDE  02/02/2021   LEFT HEART CATH AND CORONARY ANGIOGRAPHY N/A 12/24/2020   Procedure: LEFT HEART CATH AND CORONARY ANGIOGRAPHY;  Surgeon: Swaziland, Peter M, MD;  Location: [facility] INVASIVE CV LAB;  Service: Cardiovascular;  Laterality: N/A;   PROSTATECTOMY     SVT ABLATION N/A 10/20/2022   Procedure: SVT ABLATION;  Surgeon: Maurice Small, MD;  Location: [facility] INVASIVE CV LAB;  Service: Cardiovascular;  Laterality: N/A;   TEE WITHOUT CARDIOVERSION N/A 01/19/2021   Procedure: TRANSESOPHAGEAL ECHOCARDIOGRAM (TEE);  Surgeon: Corliss Skains, MD;  Location: [facility] OR;  Service: Open Heart Surgery;  Laterality: N/A;   TOTAL HIP ARTHROPLASTY Left 11/10/2015   Procedure: LEFT TOTAL HIP ARTHROPLASTY ANTERIOR APPROACH;  Surgeon: Kathryne Hitch,  MD;  Location: [facility] OR;  Service: Orthopedics;  Laterality: Left;    Family History  Problem Relation Age of Onset   Alzheimer's disease Mother    Stroke Father    Diabetes Sister    Heart attack Maternal Grandmother    Cancer Maternal Uncle        prostate   Prostate cancer Maternal Uncle    Cancer Maternal Uncle        prostate   Prostate cancer Maternal Uncle    Colon polyps Brother    Colon cancer Neg Hx    Esophageal cancer Neg Hx    Rectal cancer Neg Hx    Stomach cancer Neg Hx     Social History   Socioeconomic History   Marital status: Married    Spouse  name: Not on file   Number of children: 0   Years of education: Not on file   Highest education level: Not on file  Occupational History   Occupation: Employed at [employer]  Tobacco Use   Smoking status: Never   Smokeless tobacco: Never  Vaping Use   Vaping status: Never Used  Substance and Sexual Activity   Alcohol use: Not Currently    Alcohol/week: 0.0 standard drinks of alcohol    Comment: rarely   Drug use: No   Sexual activity: Yes    Partners: Female    Birth control/protection: None  Other Topics Concern   Not on file  Social History Narrative   Not on file   Social Determinants of Health   Financial Resource Strain: Low Risk  (08/04/2022)   Overall Financial Resource Strain (CARDIA)    Difficulty of Paying Living Expenses: Not hard at all  Food Insecurity: No Food Insecurity (10/29/2022)   Hunger Vital Sign    Worried About Running Out of Food in the Last Year: Never true    Ran Out of Food in the Last Year: Never true  Transportation Needs: No Transportation Needs (10/29/2022)   PRAPARE - Administrator, Civil Service (Medical): No    Lack of Transportation (Non-Medical): No  Physical Activity: Sufficiently Active (08/04/2022)   Exercise Vital Sign    Days of Exercise per Week: 5 days    Minutes of Exercise per Session: 60 min  Stress: No Stress Concern Present (08/04/2022)   [employer] of Occupational Health - Occupational Stress Questionnaire    Feeling of Stress : Not at all  Social Connections: Moderately Isolated (08/04/2022)   Social Connection and Isolation Panel [NHANES]    Frequency of Communication with Friends and Family: More than three times a week    Frequency of Social Gatherings with Friends and Family: More than three times a week    Attends Religious Services: Never    Database administrator or Organizations: No    Attends Banker Meetings: Never    Marital Status: Married  Catering manager Violence: Not At  Risk (10/29/2022)   Humiliation, Afraid, Rape, and Kick questionnaire    Fear of Current or Ex-Partner: No    Emotionally Abused: No    Physically Abused: No    Sexually Abused: No    Outpatient Medications Prior to Visit  Medication Sig Dispense Refill   amLODipine (NORVASC) 5 MG tablet Take 1 tablet (5 mg) by mouth every morning & take 0.5 tablet (2.5 mg) by mouth at night. 135 tablet 1   aspirin EC 81 MG tablet Take 81 mg by mouth daily. Swallow  whole.     clonazePAM (KLONOPIN) 1 MG tablet TAKE 1 TABLET BY MOUTH TWICE A DAY AS NEEDED FOR ANXIETY (Patient taking differently: Take 1 mg by mouth 2 (two) times daily. TAKE 1 TABLET BY MOUTH TWICE A DAY AS NEEDED FOR ANXIETY) 60 tablet 3   edoxaban (SAVAYSA) 60 MG TABS tablet Take 60 mg by mouth daily. 30 tablet 3   Eszopiclone 3 MG TABS TAKE 1 TABLET BY MOUTH EVERYDAY AT BEDTIME 30 tablet 5   Evolocumab (REPATHA SURECLICK) 140 MG/ML SOAJ INJECT 140 MG INTO THE SKIN EVERY 14 (FOURTEEN) DAYS. 6 mL 3   ezetimibe (ZETIA) 10 MG tablet TAKE 1 TABLET BY MOUTH EVERY DAY 90 tablet 3   loratadine (CLARITIN) 10 MG tablet TAKE 1 TABLET BY MOUTH EVERY DAY 90 tablet 1   metoprolol succinate (TOPROL-XL) 12.5 mg TB24 24 hr tablet Take 12.5 mg by mouth daily.     Omega-3 Fatty Acids (OMEGA 3 PO) Take 500 mg by mouth every morning.     Phenylephrine HCl (AFRIN ALLERGY NA) Place 2 sprays into the nose 2 (two) times daily.     No facility-administered medications prior to visit.    Allergies  Allergen Reactions   Ranolazine Other (See Comments)    Chest discomfort/Reflux   Amoxicillin Hives   Atorvastatin Other (See Comments)    myalgias  Other reaction(s): Myalgias (intolerance)   Pravastatin Other (See Comments)    myalgias  Other reaction(s): Myalgias (intolerance)   Rosuvastatin Other (See Comments)    myalgias  Other reaction(s): Myalgias (intolerance)   Statins Other (See Comments)    myalgias   Eliquis [Apixaban] Rash and Other (See  Comments)    Joint discomfort and elevated BP   Xarelto [Rivaroxaban] Rash    Joint discomfort, elevated BP    Review of Systems  Constitutional:  Negative for appetite change, fatigue and fever.  HENT:  Negative for congestion, ear pain, sinus pressure and sore throat.   Respiratory:  Positive for shortness of breath. Negative for cough, chest tightness and wheezing.   Cardiovascular:  Negative for chest pain and palpitations.  Gastrointestinal:  Negative for abdominal pain, constipation, diarrhea, nausea and vomiting.  Genitourinary:  Negative for dysuria and hematuria.  Musculoskeletal:  Positive for back pain. Negative for arthralgias, joint swelling and myalgias.  Skin:  Negative for rash.  Neurological:  Negative for dizziness, weakness and headaches.  Psychiatric/Behavioral:  Negative for dysphoric mood. The patient is not nervous/anxious.        Objective:        12/26/2022   12:22 PM 12/26/2022   11:56 AM 12/15/2022    4:38 PM  Vitals with BMI  Height  5\' 11"  5\' 11"   Weight  223 lbs 222 lbs 6 oz  BMI  31.12 31.03  Systolic 118 130 536  Diastolic 62 68 90  Pulse  63 68    Orthostatic VS for the past 72 hrs (Last 3 readings):  Patient Position BP Location  12/26/22 1222 Sitting Right Arm  12/26/22 1156 Sitting Left Arm     Physical Exam Vitals and nursing note reviewed.  Constitutional:      Appearance: He is obese.  HENT:     Head: Normocephalic and atraumatic.  Cardiovascular:     Rate and Rhythm: Normal rate and regular rhythm.  Pulmonary:     Effort: Pulmonary effort is normal.     Breath sounds: Rales (bilateral lungs) present.  Musculoskeletal:  General: Normal range of motion.  Neurological:     Mental Status: He is alert.     Health Maintenance Due  Topic Date Due   Medicare Annual Wellness (AWV)  Never done   DTaP/Tdap/Td (1 - Tdap) Never done   Zoster Vaccines- Shingrix (1 of 2) Never done   INFLUENZA VACCINE  10/06/2022     There are no preventive care reminders to display for this patient.   Lab Results  Component Value Date   TSH 1.570 04/04/2022   Lab Results  Component Value Date   WBC 9.9 12/04/2022   HGB 15.6 12/04/2022   HCT 46.0 12/04/2022   MCV 86.1 12/04/2022   PLT 185 12/04/2022   Lab Results  Component Value Date   NA 138 12/04/2022   K 4.0 12/04/2022   CO2 20 (L) 12/04/2022   GLUCOSE 131 (H) 12/04/2022   BUN 11 12/04/2022   CREATININE 1.09 12/04/2022   BILITOT 0.3 12/04/2022   ALKPHOS 58 12/04/2022   AST 22 12/04/2022   ALT 29 12/04/2022   PROT 6.0 (L) 12/04/2022   ALBUMIN 3.3 (L) 12/04/2022   CALCIUM 8.5 (L) 12/04/2022   ANIONGAP 10 12/04/2022   EGFR 73 11/23/2022   Lab Results  Component Value Date   CHOL 86 (L) 11/23/2022   Lab Results  Component Value Date   HDL 33 (L) 11/23/2022   Lab Results  Component Value Date   LDLCALC 27 11/23/2022   Lab Results  Component Value Date   TRIG 150 (H) 11/23/2022   Lab Results  Component Value Date   CHOLHDL 2.6 11/23/2022   Lab Results  Component Value Date   HGBA1C 5.8 (H) 03/04/2021       Assessment & Plan:  Shortness of breath Assessment & Plan: Patient reports episode of shortness of breath and back pain yesterday which has improved today.  He was concerned due to his history of PE back in August. He is on anticoagulation, compliant with it. Physical exam is unremarkable except for few crackles in his lungs.  Plan: His Wells score for PE is low at 1.5%. Since he appears comfortable and does not appear to be in any distress, I do not feel the need for him to go to the emergency room Ordered chest x-ray in view of his crackles and history of pulmonary vascular congestion noted on prior x-rays He does understand to call 911 for any worsening symptoms I do not feel the need for any additional testing or blood work at this time.  Orders: -     POCT Influenza A/B -     POC COVID-19 BinaxNow -     DG  Chest 2 View; Future  History of pulmonary embolism   On anticoagulation with Savaysa. If he feels worse, he should have a CTA lung to rule out PE and consideration for change in anticoagulation to warfarin.  No orders of the defined types were placed in this encounter.   Orders Placed This Encounter  Procedures   DG Chest 2 View   POCT Influenza A/B   POC COVID-19 BinaxNow     Follow-up: Return in about 4 weeks (around 01/23/2023).  An After Visit Summary was printed and given to the patient.    I,Lauren M Auman,acting as a Neurosurgeon for [employer], MD.,have documented all relevant documentation on the behalf of Windell Moment, MD,as directed by  Windell Moment, MD while in the presence of Windell Moment, MD.   Windell Moment, MD  [facility] [phone]

## 2023-01-04 ENCOUNTER — Telehealth (INDEPENDENT_AMBULATORY_CARE_PROVIDER_SITE_OTHER): Payer: Managed Care, Other (non HMO) | Admitting: Physician Assistant

## 2023-01-04 ENCOUNTER — Encounter: Payer: Self-pay | Admitting: Physician Assistant

## 2023-01-04 VITALS — Ht 71.0 in | Wt 223.0 lb

## 2023-01-04 DIAGNOSIS — R1013 Epigastric pain: Secondary | ICD-10-CM

## 2023-01-04 MED ORDER — DICYCLOMINE HCL 20 MG PO TABS
20.0000 mg | ORAL_TABLET | Freq: Four times a day (QID) | ORAL | 3 refills | Status: DC
Start: 2023-01-04 — End: 2023-02-13

## 2023-01-04 NOTE — Progress Notes (Signed)
 Virtual Visit via Video Note   This visit type was conducted either per patient request OR due to national recommendations for restrictions regarding the COVID-19 Pandemic (e.g. social distancing) in an effort to limit this patient's exposure and mitigate transmission in our community.  Due to his co-morbid illnesses, this patient is at least at moderate risk for complications without adequate follow up.  This format is felt to be most appropriate for this patient at this time.  All issues noted in this document were discussed and addressed.  A limited physical exam was performed with this format.  A verbal consent was obtained for the virtual visit.   Date:  01/05/2023   ID:  Jacob Collier, DOB [birth year], MRN [id number]  Patient Location: Home Provider Location: Office/Clinic  PCP:  Blane Ohara, MD   Chief Complaint:  Stomach pain  History of Present Illness:    Jacob Collier is a 67 y.o. male with Admits to having stool with dark spots in it. He is taking it at night with supper. His stomach has been hurting him for a few days. He has not had any symptoms similar to the other blood thinners. He has been taking tylenol while at work and state it has helped a little bit. He does have a history of stomach ulcers and states the pain he has been feeling is similar to how it felt before when he did have an ulcer. He is curious if he can now come off the edoxaban and go back onto his plavix. We will reach out to his cardiologist and see if they are ok having him discontinue it.  Chief Complaint  Patient presents with   Dark Blood spots in stool  .     Past Medical History:  Diagnosis Date   Anxiety    Arthritis    Coronary artery disease    a. S/p DES to RCA in 01/2019 b. s/p CABG x4 (LIMA-LAD, reverse SVG-PDA, reverse SVG-OM1, reverse SVG-1st Diag) in 01/2021   Diverticulosis    Dyspnea    Hard of hearing    Hyperlipidemia    Hypertension    hx of elevation on lyrica, off med  and now normal    Paroxysmal SVT (supraventricular tachycardia) (HCC)    Post-op atrial fibrillation    Prostate cancer (HCC)    Wears glasses     Past Surgical History:  Procedure Laterality Date   ANTERIOR CERVICAL DECOMP/DISCECTOMY FUSION N/A 09/03/2021   Procedure: CERVICAL FIVE-SIX, CERVICAL SIX-SEVEN ANTERIOR CERVICAL DECOMPRESSION/DISCECTOMY FUSION;  Surgeon: Julio Sicks, MD;  Location: [facility] OR;  Service: Neurosurgery;  Laterality: N/A;   CARDIAC CATHETERIZATION     with 2 stents    COLECTOMY  05/2018   COLONOSCOPY     CORONARY ARTERY BYPASS GRAFT N/A 01/19/2021   Procedure: CORONARY ARTERY BYPASS GRAFTING (CABG), ON PUMP TIMES FOUR, USING LEFT INTERNAL MAMMARY ARTERY AND ENDOSOCPICALLY HARVESTED RIGHT GREATER SAPHENOUS VEIN;  Surgeon: Corliss Skains, MD;  Location: [facility] OR;  Service: Open Heart Surgery;  Laterality: N/A;   CYSTOSCOPY WITH URETHRAL DILATATION N/A 01/14/2021   Procedure: CYSTOSCOPY WITH BALLOON  DILATATION;  Surgeon: Alfredo Martinez, MD;  Location: [facility] ORS;  Service: Urology;  Laterality: N/A;   EYE SURGERY  02/2020   bilaterl cataracts   IR THORACENTESIS ASP PLEURAL SPACE W/IMG GUIDE  02/02/2021   LEFT HEART CATH AND CORONARY ANGIOGRAPHY N/A 12/24/2020   Procedure: LEFT HEART CATH AND CORONARY ANGIOGRAPHY;  Surgeon: Swaziland, Peter M, MD;  Location: [facility] INVASIVE CV LAB;  Service: Cardiovascular;  Laterality: N/A;   PROSTATECTOMY     SVT ABLATION N/A 10/20/2022   Procedure: SVT ABLATION;  Surgeon: Maurice Small, MD;  Location: [facility]VAS[facility];  Service: Cardiovascular;  Laterality: N/A;   TEE WITHOUT CARDIOVERSION N/A 01/19/2021   Procedure: TRANSESOPHAGEAL ECHOCARDIOGRAM (TEE);  Surgeon: Corliss Skains, MD;  Location: [facility] OR;  Service: Open Heart Surgery;  Laterality: N/A;   TOTAL HIP ARTHROPLASTY Left 11/10/2015   Procedure: LEFT TOTAL HIP ARTHROPLASTY ANTERIOR APPROACH;  Surgeon: Kathryne Hitch, MD;  Location: [facility] OR;  Service: Orthopedics;   Laterality: Left;    Family History  Problem Relation Age of Onset   Alzheimer's disease Mother    Stroke Father    Diabetes Sister    Heart attack Maternal Grandmother    Cancer Maternal Uncle        prostate   Prostate cancer Maternal Uncle    Cancer Maternal Uncle        prostate   Prostate cancer Maternal Uncle    Colon polyps Brother    Colon cancer Neg Hx    Esophageal cancer Neg Hx    Rectal cancer Neg Hx    Stomach cancer Neg Hx     Social History   Socioeconomic History   Marital status: Married    Spouse name: Not on file   Number of children: 0   Years of education: Not on file   Highest education level: Not on file  Occupational History   Occupation: Employed at [employer]  Tobacco Use   Smoking status: Never   Smokeless tobacco: Never  Vaping Use   Vaping status: Never Used  Substance and Sexual Activity   Alcohol use: Not Currently    Alcohol/week: 0.0 standard drinks of alcohol    Comment: rarely   Drug use: No   Sexual activity: Yes    Partners: Female    Birth control/protection: None  Other Topics Concern   Not on file  Social History Narrative   Not on file   Social Determinants of Health   Financial Resource Strain: Low Risk  (08/04/2022)   Overall Financial Resource Strain (CARDIA)    Difficulty of Paying Living Expenses: Not hard at all  Food Insecurity: No Food Insecurity (10/29/2022)   Hunger Vital Sign    Worried About Running Out of Food in the Last Year: Never true    Ran Out of Food in the Last Year: Never true  Transportation Needs: No Transportation Needs (10/29/2022)   PRAPARE - Administrator, Civil Service (Medical): No    Lack of Transportation (Non-Medical): No  Physical Activity: Sufficiently Active (08/04/2022)   Exercise Vital Sign    Days of Exercise per Week: 5 days    Minutes of Exercise per Session: 60 min  Stress: No Stress Concern Present (08/04/2022)   [employer] of Occupational Health -  Occupational Stress Questionnaire    Feeling of Stress : Not at all  Social Connections: Moderately Isolated (08/04/2022)   Social Connection and Isolation Panel [NHANES]    Frequency of Communication with Friends and Family: More than three times a week    Frequency of Social Gatherings with Friends and Family: More than three times a week    Attends Religious Services: Never    Database administrator or Organizations: No    Attends Banker Meetings: Never    Marital Status: Married  Intimate  Partner Violence: Not At Risk (10/29/2022)   Humiliation, Afraid, Rape, and Kick questionnaire    Fear of Current or Ex-Partner: No    Emotionally Abused: No    Physically Abused: No    Sexually Abused: No    Outpatient Medications Prior to Visit  Medication Sig Dispense Refill   amLODipine (NORVASC) 5 MG tablet Take 1 tablet (5 mg) by mouth every morning & take 0.5 tablet (2.5 mg) by mouth at night. 135 tablet 1   aspirin EC 81 MG tablet Take 81 mg by mouth daily. Swallow whole.     clonazePAM (KLONOPIN) 1 MG tablet TAKE 1 TABLET BY MOUTH TWICE A DAY AS NEEDED FOR ANXIETY (Patient taking differently: Take 1 mg by mouth 2 (two) times daily. TAKE 1 TABLET BY MOUTH TWICE A DAY AS NEEDED FOR ANXIETY) 60 tablet 3   edoxaban (SAVAYSA) 60 MG TABS tablet Take 60 mg by mouth daily. 30 tablet 3   Eszopiclone 3 MG TABS TAKE 1 TABLET BY MOUTH EVERYDAY AT BEDTIME 30 tablet 5   Evolocumab (REPATHA SURECLICK) 140 MG/ML SOAJ INJECT 140 MG INTO THE SKIN EVERY 14 (FOURTEEN) DAYS. 6 mL 3   ezetimibe (ZETIA) 10 MG tablet TAKE 1 TABLET BY MOUTH EVERY DAY 90 tablet 3   loratadine (CLARITIN) 10 MG tablet TAKE 1 TABLET BY MOUTH EVERY DAY 90 tablet 1   metoprolol succinate (TOPROL-XL) 12.5 mg TB24 24 hr tablet Take 12.5 mg by mouth daily.     Omega-3 Fatty Acids (OMEGA 3 PO) Take 500 mg by mouth every morning.     Phenylephrine HCl (AFRIN ALLERGY NA) Place 2 sprays into the nose 2 (two) times daily.     No  facility-administered medications prior to visit.    Allergies  Allergen Reactions   Ranolazine Other (See Comments)    Chest discomfort/Reflux   Amoxicillin Hives   Atorvastatin Other (See Comments)    myalgias  Other reaction(s): Myalgias (intolerance)   Pravastatin Other (See Comments)    myalgias  Other reaction(s): Myalgias (intolerance)   Rosuvastatin Other (See Comments)    myalgias  Other reaction(s): Myalgias (intolerance)   Statins Other (See Comments)    myalgias   Eliquis [Apixaban] Rash and Other (See Comments)    Joint discomfort and elevated BP   Xarelto [Rivaroxaban] Rash    Joint discomfort, elevated BP     Social History   Tobacco Use   Smoking status: Never   Smokeless tobacco: Never  Vaping Use   Vaping status: Never Used  Substance Use Topics   Alcohol use: Not Currently    Alcohol/week: 0.0 standard drinks of alcohol    Comment: rarely   Drug use: No     Review of Systems  Constitutional:  Negative for fever and malaise/fatigue.  HENT:  Negative for congestion, ear pain and sore throat.   Respiratory:  Negative for cough and shortness of breath.   Cardiovascular:  Negative for chest pain and leg swelling.  Gastrointestinal:  Positive for abdominal pain and blood in stool. Negative for constipation, heartburn and nausea.  Genitourinary:  Negative for dysuria.  Musculoskeletal:  Negative for joint pain and myalgias.  Skin:  Negative for rash.  Neurological:  Negative for dizziness, weakness and headaches.     Labs/Other Tests and Data Reviewed:    Recent Labs: 04/04/2022: TSH 1.570 10/28/2022: B Natriuretic Peptide 40.3 11/02/2022: Magnesium 2.2 12/04/2022: ALT 29; BUN 11; Creatinine, Ser 1.09; Hemoglobin 15.6; Platelets 185; Potassium 4.0; Sodium 138  Recent Lipid Panel Lab Results  Component Value Date/Time   CHOL 86 (L) 11/23/2022 02:39 PM   TRIG 150 (H) 11/23/2022 02:39 PM   HDL 33 (L) 11/23/2022 02:39 PM   CHOLHDL 2.6  11/23/2022 02:39 PM   CHOLHDL 4.8 06/06/2019 06:12 AM   LDLCALC 27 11/23/2022 02:39 PM    Wt Readings from Last 3 Encounters:  01/04/23 223 lb (101.2 kg)  12/26/22 223 lb (101.2 kg)  12/15/22 222 lb 6.4 oz (100.9 kg)     Objective:    Vital Signs:  Ht 5\' 11"  (1.803 m)   Wt 223 lb (101.2 kg)   BMI 31.10 kg/m    Physical Exam  Unable to perform due to video visit Patient stated the pain is more epigastric. Denies any recent hemorrhoids or anal trauma.  ASSESSMENT & PLAN:   Epigastric pain Assessment & Plan: Will test with hemoccult cards  Reached out to Dr. Tresa Endo about discontinuing Edoxaban Will start PPI if he will be coming off edoxaban   Orders: -     Dicyclomine HCl; Take 1 tablet (20 mg total) by mouth every 6 (six) hours.  Dispense: 120 tablet; Refill: 3 -     Pantoprazole Sodium; Take 1 tablet (40 mg total) by mouth daily.  Dispense: 30 tablet; Refill: 3     No orders of the defined types were placed in this encounter.    Meds ordered this encounter  Medications   dicyclomine (BENTYL) 20 MG tablet    Sig: Take 1 tablet (20 mg total) by mouth every 6 (six) hours.    Dispense:  120 tablet    Refill:  3   pantoprazole (PROTONIX) 40 MG tablet    Sig: Take 1 tablet (40 mg total) by mouth daily.    Dispense:  30 tablet    Refill:  3    I spent 20 minutes dedicated to the care of this patient on the date of this encounter to include face-to-face time with the patient.  Follow Up:  In Person prn

## 2023-01-05 ENCOUNTER — Ambulatory Visit: Payer: Managed Care, Other (non HMO) | Admitting: Orthopaedic Surgery

## 2023-01-05 ENCOUNTER — Encounter: Payer: Self-pay | Admitting: Physician Assistant

## 2023-01-05 MED ORDER — PANTOPRAZOLE SODIUM 40 MG PO TBEC
40.0000 mg | DELAYED_RELEASE_TABLET | Freq: Every day | ORAL | 3 refills | Status: DC
Start: 2023-01-05 — End: 2023-01-31

## 2023-01-05 NOTE — Assessment & Plan Note (Signed)
Will test with hemoccult cards  Reached out to Dr. Tresa Endo about discontinuing Edoxaban Will start PPI if he will be coming off edoxaban

## 2023-01-06 ENCOUNTER — Telehealth: Payer: Self-pay

## 2023-01-06 NOTE — Telephone Encounter (Signed)
error 

## 2023-01-09 ENCOUNTER — Other Ambulatory Visit: Payer: Self-pay

## 2023-01-09 DIAGNOSIS — I2699 Other pulmonary embolism without acute cor pulmonale: Secondary | ICD-10-CM

## 2023-01-10 MED ORDER — EDOXABAN TOSYLATE 60 MG PO TABS
60.0000 mg | ORAL_TABLET | Freq: Every day | ORAL | 3 refills | Status: DC
Start: 2023-01-10 — End: 2023-02-13

## 2023-01-12 ENCOUNTER — Telehealth: Payer: Self-pay | Admitting: *Deleted

## 2023-01-12 NOTE — Telephone Encounter (Signed)
TCT patient regarding his appt for tomorrow. Pt had requested this appt. Per chart review-pt's HGB is stable/normal. Dr. Leonides Schanz is unclear as to why pt is coming. No answer but left vm message for pt to call back to discuss reason for appt.

## 2023-01-13 ENCOUNTER — Inpatient Hospital Stay: Payer: Managed Care, Other (non HMO) | Attending: Hematology and Oncology

## 2023-01-13 ENCOUNTER — Inpatient Hospital Stay (HOSPITAL_BASED_OUTPATIENT_CLINIC_OR_DEPARTMENT_OTHER): Payer: Managed Care, Other (non HMO) | Admitting: Hematology and Oncology

## 2023-01-13 ENCOUNTER — Other Ambulatory Visit: Payer: Self-pay | Admitting: *Deleted

## 2023-01-13 VITALS — BP 145/85 | HR 75 | Temp 98.9°F | Resp 15 | Wt 222.9 lb

## 2023-01-13 DIAGNOSIS — Z79890 Hormone replacement therapy (postmenopausal): Secondary | ICD-10-CM | POA: Diagnosis not present

## 2023-01-13 DIAGNOSIS — Z8546 Personal history of malignant neoplasm of prostate: Secondary | ICD-10-CM | POA: Insufficient documentation

## 2023-01-13 DIAGNOSIS — D751 Secondary polycythemia: Secondary | ICD-10-CM | POA: Diagnosis present

## 2023-01-13 DIAGNOSIS — G4733 Obstructive sleep apnea (adult) (pediatric): Secondary | ICD-10-CM | POA: Diagnosis not present

## 2023-01-13 LAB — CMP (CANCER CENTER ONLY)
ALT: 20 U/L (ref 0–44)
AST: 23 U/L (ref 15–41)
Albumin: 4.2 g/dL (ref 3.5–5.0)
Alkaline Phosphatase: 77 U/L (ref 38–126)
Anion gap: 7 (ref 5–15)
BUN: 15 mg/dL (ref 8–23)
CO2: 23 mmol/L (ref 22–32)
Calcium: 9.1 mg/dL (ref 8.9–10.3)
Chloride: 108 mmol/L (ref 98–111)
Creatinine: 1.19 mg/dL (ref 0.61–1.24)
GFR, Estimated: >60 mL/min (ref >60)
Glucose, Bld: 87 mg/dL (ref 70–99)
Potassium: 3.9 mmol/L (ref 3.5–5.1)
Sodium: 138 mmol/L (ref 135–145)
Total Bilirubin: 0.8 mg/dL (ref <1.2)
Total Protein: 7 g/dL (ref 6.5–8.1)

## 2023-01-13 LAB — CBC WITH DIFFERENTIAL (CANCER CENTER ONLY)
Abs Immature Granulocytes: 0.05 10*3/uL (ref 0.00–0.07)
Basophils Absolute: 0.1 10*3/uL (ref 0.0–0.1)
Basophils Relative: 1 %
Eosinophils Absolute: 0.2 10*3/uL (ref 0.0–0.5)
Eosinophils Relative: 2 %
HCT: 48.7 % (ref 39.0–52.0)
Hemoglobin: 16.6 g/dL (ref 13.0–17.0)
Immature Granulocytes: 1 %
Lymphocytes Relative: 21 %
Lymphs Abs: 1.8 10*3/uL (ref 0.7–4.0)
MCH: 29.3 pg (ref 26.0–34.0)
MCHC: 34.1 g/dL (ref 30.0–36.0)
MCV: 86 fL (ref 80.0–100.0)
Monocytes Absolute: 0.6 10*3/uL (ref 0.1–1.0)
Monocytes Relative: 7 %
Neutro Abs: 5.8 10*3/uL (ref 1.7–7.7)
Neutrophils Relative %: 68 %
Platelet Count: 190 10*3/uL (ref 150–400)
RBC: 5.66 MIL/uL (ref 4.22–5.81)
RDW: 16 % — ABNORMAL HIGH (ref 11.5–15.5)
WBC Count: 8.4 10*3/uL (ref 4.0–10.5)
nRBC: 0 % (ref 0.0–0.2)

## 2023-01-13 NOTE — Progress Notes (Unsigned)
[facility] [phone]   [phone]  PROGRESS NOTE  Patient Care Team: Blane Ohara, MD as PCP - General (Family Medicine) Lennette Bihari, MD as PCP - Cardiology (Cardiology) Mealor, Roberts Gaudy, MD as PCP - Electrophysiology (Cardiology) Crista Elliot, MD as Consulting Physician (Urology) Smitty Knudsen as Physician Assistant (Cardiology)  Hematological/Oncological History # Polycythemia, secondary. Due to OSA/Exogenous Testosterone  06/11/2019: WBC 6.9, Hgb 15.5, MCV 91.4, Plt 219 04/01/2020: WBC 8.6, Hgb 18.4, MCV 87, Plt 219 08/03/2020: WBC 8.8, Hgb 19.3, MCV 88.7, Plt 187 09/09/2020: establish care with Dr. Leonides Schanz. Hgb 18.2  03/17/2021: WBC 9.3, Hgb 14.6, MCV 88.8, Plt 170   Interval History:  [name] 67 y.o. male with medical history significant for secondary polycythemia who presents for a follow up visit. The patient's last visit was on 03/17/2021. In the interim since the last visit he has been diagnosed with obstructive sleep apnea but has been having difficulty with his CPAP machine.  On exam today [name] is accompanied by his wife.  He reports he reports that he has been diagnosed with OSA but he is "having a lot of difficulty with the CPAP machine".  He reports that there have been times where he is had his blood taken or where his undergone surgeries and he feels better afterwards due to the drop in his hemoglobin.  His primary doctor requested consideration for therapeutic phlebotomy.  The patient reports that the mask does not fit well and he does feel quite fatigued throughout the daytime as he is not sleeping well.  He notes he continues his testosterone shots but is also closely monitoring his PSA every 3 to 6 months.  He reports at last check it was less than 0.2.  He reports that he also underwent a CABG and ablation and developed a pulmonary embolism in the interim since our last visit.  He reports that he had a lot of  difficulty with Eliquis and Xarelto therapy.  Other than his cardiac issues he has felt well with no recent illnesses.  He is not having any runny nose, sore throat, or cough..  He currently denies any fevers, chills, sweats, nausea, vomiting or diarrhea.  A full 10 point ROS is listed below.  MEDICAL HISTORY:  Past Medical History:  Diagnosis Date   Anxiety    Arthritis    Coronary artery disease    a. S/p DES to RCA in 01/2019 b. s/p CABG x4 (LIMA-LAD, reverse SVG-PDA, reverse SVG-OM1, reverse SVG-1st Diag) in 01/2021   Diverticulosis    Dyspnea    Hard of hearing    Hyperlipidemia    Hypertension    hx of elevation on lyrica, off med and now normal    Paroxysmal SVT (supraventricular tachycardia) (HCC)    Post-op atrial fibrillation    Prostate cancer (HCC)    Wears glasses     SURGICAL HISTORY: Past Surgical History:  Procedure Laterality Date   ANTERIOR CERVICAL DECOMP/DISCECTOMY FUSION N/A 09/03/2021   Procedure: CERVICAL FIVE-SIX, CERVICAL SIX-SEVEN ANTERIOR CERVICAL DECOMPRESSION/DISCECTOMY FUSION;  Surgeon: Julio Sicks, MD;  Location: [facility] OR;  Service: Neurosurgery;  Laterality: N/A;   CARDIAC CATHETERIZATION     with 2 stents    COLECTOMY  05/2018   COLONOSCOPY     CORONARY ARTERY BYPASS GRAFT N/A 01/19/2021   Procedure: CORONARY ARTERY BYPASS GRAFTING (CABG), ON PUMP TIMES FOUR, USING LEFT INTERNAL MAMMARY ARTERY AND ENDOSOCPICALLY HARVESTED RIGHT GREATER SAPHENOUS VEIN;  Surgeon: Corliss Skains, MD;  Location: [facility] OR;  Service: Open Heart Surgery;  Laterality: N/A;   CYSTOSCOPY WITH URETHRAL DILATATION N/A 01/14/2021   Procedure: CYSTOSCOPY WITH BALLOON  DILATATION;  Surgeon: Alfredo Martinez, MD;  Location: [facility] ORS;  Service: Urology;  Laterality: N/A;   EYE SURGERY  02/2020   bilaterl cataracts   IR THORACENTESIS ASP PLEURAL SPACE W/IMG GUIDE  02/02/2021   LEFT HEART CATH AND CORONARY ANGIOGRAPHY N/A 12/24/2020   Procedure: LEFT HEART CATH AND CORONARY  ANGIOGRAPHY;  Surgeon: [place]az[place], Peter M, MD;  Location: [facility] INVASIVE CV LAB;  Service: Cardiovascular;  Laterality: N/A;   PROSTATECTOMY     SVT ABLATION N/A 10/20/2022   Procedure: SVT ABLATION;  Surgeon: Maurice Small, MD;  Location: [facility] INVASIVE CV LAB;  Service: Cardiovascular;  Laterality: N/A;   TEE WITHOUT CARDIOVERSION N/A 01/19/2021   Procedure: TRANSESOPHAGEAL ECHOCARDIOGRAM (TEE);  Surgeon: Corliss Skains, MD;  Location: [facility] OR;  Service: Open Heart Surgery;  Laterality: N/A;   TOTAL HIP ARTHROPLASTY Left 11/10/2015   Procedure: LEFT TOTAL HIP ARTHROPLASTY ANTERIOR APPROACH;  Surgeon: Kathryne Hitch, MD;  Location: [facility] OR;  Service: Orthopedics;  Laterality: Left;    SOCIAL HISTORY: Social History   Socioeconomic History   Marital status: Married    Spouse name: Not on file   Number of children: 0   Years of education: Not on file   Highest education level: Not on file  Occupational History   Occupation: Employed at [employer]  Tobacco Use   Smoking status: Never   Smokeless tobacco: Never  Vaping Use   Vaping status: Never Used  Substance and Sexual Activity   Alcohol use: Not Currently    Alcohol/week: 0.0 standard drinks of alcohol    Comment: rarely   Drug use: No   Sexual activity: Yes    Partners: Female    Birth control/protection: None  Other Topics Concern   Not on file  Social History Narrative   Not on file   Social Determinants of Health   Financial Resource Strain: Low Risk  (08/04/2022)   Overall Financial Resource Strain (CARDIA)    Difficulty of Paying Living Expenses: Not hard at all  Food Insecurity: No Food Insecurity (10/29/2022)   Hunger Vital Sign    Worried About Running Out of Food in the Last Year: Never true    Ran Out of Food in the Last Year: Never true  Transportation Needs: No Transportation Needs (10/29/2022)   PRAPARE - Administrator, Civil Service (Medical): No    Lack of Transportation  (Non-Medical): No  Physical Activity: Sufficiently Active (08/04/2022)   Exercise Vital Sign    Days of Exercise per Week: 5 days    Minutes of Exercise per Session: 60 min  Stress: No Stress Concern Present (08/04/2022)   [employer] of Occupational Health - Occupational Stress Questionnaire    Feeling of Stress : Not at all  Social Connections: Moderately Isolated (08/04/2022)   Social Connection and Isolation Panel [NHANES]    Frequency of Communication with Friends and Family: More than three times a week    Frequency of Social Gatherings with Friends and Family: More than three times a week    Attends Religious Services: Never    Database administrator or Organizations: No    Attends Banker Meetings: Never    Marital Status: Married  Catering manager Violence: Not At Risk (10/29/2022)   Humiliation, Afraid, Rape,  and Kick questionnaire    Fear of Current or Ex-Partner: No    Emotionally Abused: No    Physically Abused: No    Sexually Abused: No    FAMILY HISTORY: Family History  Problem Relation Age of Onset   Alzheimer's disease Mother    Stroke Father    Diabetes Sister    Heart attack Maternal Grandmother    Cancer Maternal Uncle        prostate   Prostate cancer Maternal Uncle    Cancer Maternal Uncle        prostate   Prostate cancer Maternal Uncle    Colon polyps Brother    Colon cancer Neg Hx    Esophageal cancer Neg Hx    Rectal cancer Neg Hx    Stomach cancer Neg Hx     ALLERGIES:  is allergic to ranolazine, amoxicillin, atorvastatin, pravastatin, rosuvastatin, statins, eliquis [apixaban], and xarelto [rivaroxaban].  MEDICATIONS:  Current Outpatient Medications  Medication Sig Dispense Refill   amLODipine (NORVASC) 5 MG tablet Take 1 tablet (5 mg) by mouth every morning & take 0.5 tablet (2.5 mg) by mouth at night. 135 tablet 1   aspirin EC 81 MG tablet Take 81 mg by mouth daily. Swallow whole.     clonazePAM (KLONOPIN) 1 MG tablet TAKE  1 TABLET BY MOUTH TWICE A DAY AS NEEDED FOR ANXIETY (Patient taking differently: Take 1 mg by mouth 2 (two) times daily. TAKE 1 TABLET BY MOUTH TWICE A DAY AS NEEDED FOR ANXIETY) 60 tablet 3   dicyclomine (BENTYL) 20 MG tablet Take 1 tablet (20 mg total) by mouth every 6 (six) hours. 120 tablet 3   edoxaban (SAVAYSA) 60 MG TABS tablet Take 60 mg by mouth daily. 30 tablet 3   Eszopiclone 3 MG TABS TAKE 1 TABLET BY MOUTH EVERYDAY AT BEDTIME 30 tablet 5   Evolocumab (REPATHA SURECLICK) 140 MG/ML SOAJ INJECT 140 MG INTO THE SKIN EVERY 14 (FOURTEEN) DAYS. 6 mL 3   ezetimibe (ZETIA) 10 MG tablet TAKE 1 TABLET BY MOUTH EVERY DAY 90 tablet 3   loratadine (CLARITIN) 10 MG tablet TAKE 1 TABLET BY MOUTH EVERY DAY 90 tablet 1   metoprolol succinate (TOPROL-XL) 12.5 mg TB24 24 hr tablet Take 12.5 mg by mouth daily.     Omega-3 Fatty Acids (OMEGA 3 PO) Take 500 mg by mouth every morning.     pantoprazole (PROTONIX) 40 MG tablet Take 1 tablet (40 mg total) by mouth daily. 30 tablet 3   Phenylephrine HCl (AFRIN ALLERGY NA) Place 2 sprays into the nose 2 (two) times daily.     No current facility-administered medications for this visit.    REVIEW OF SYSTEMS:   Constitutional: ( - ) fevers, ( - )  chills , ( - ) night sweats Eyes: ( - ) blurriness of vision, ( - ) double vision, ( - ) watery eyes Ears, nose, mouth, throat, and face: ( - ) mucositis, ( - ) sore throat Respiratory: ( - ) cough, ( - ) dyspnea, ( - ) wheezes Cardiovascular: ( - ) palpitation, ( - ) chest discomfort, ( - ) lower extremity swelling Gastrointestinal:  ( - ) nausea, ( - ) heartburn, ( - ) change in bowel habits Skin: ( - ) abnormal skin rashes Lymphatics: ( - ) new lymphadenopathy, ( - ) easy bruising Neurological: ( - ) numbness, ( - ) tingling, ( - ) new weaknesses Behavioral/Psych: ( - ) mood change, ( - )  new changes  All other systems were reviewed with the patient and are negative.  PHYSICAL EXAMINATION:  Vitals:    01/13/23 1519  BP: (!) 145/85  Pulse: 75  Resp: 15  Temp: 98.9 F (37.2 C)  SpO2: 93%   Filed Weights   01/13/23 1519  Weight: 222 lb 14.4 oz (101.1 kg)    GENERAL: Well-appearing elderly Caucasian male, alert, no distress and comfortable SKIN: skin color, texture, turgor are normal, no rashes or significant lesions EYES: conjunctiva are pink and non-injected, sclera clear LUNGS: clear to auscultation and percussion with normal breathing effort HEART: regular rate & rhythm and no murmurs and no lower extremity edema Musculoskeletal: no cyanosis of digits and no clubbing  PSYCH: alert & oriented x 3, fluent speech NEURO: no focal motor/sensory deficits  LABORATORY DATA:  I have reviewed the data as listed    Latest Ref Rng & Units 01/13/2023    2:55 PM 12/04/2022    5:11 AM 12/04/2022    4:27 AM  CBC  WBC 4.0 - 10.5 K/uL 8.4   9.9   Hemoglobin 13.0 - 17.0 g/dL 16.1  09.6  04.5   Hematocrit 39.0 - 52.0 % 48.7  46.0  48.8   Platelets 150 - 400 K/uL 190   185        Latest Ref Rng & Units 01/13/2023    2:55 PM 12/04/2022    5:50 AM 12/04/2022    5:11 AM  CMP  Glucose 70 - 99 mg/dL 87  409  811   BUN 8 - 23 mg/dL 15  11  14    Creatinine 0.61 - 1.24 mg/dL 9.14  7.82  9.56   Sodium 135 - 145 mmol/L 138  138  139   Potassium 3.5 - 5.1 mmol/L 3.9  4.0  5.4   Chloride 98 - 111 mmol/L 108  108  111   CO2 22 - 32 mmol/L 23  20    Calcium 8.9 - 10.3 mg/dL 9.1  8.5    Total Protein 6.5 - 8.1 g/dL 7.0  6.0    Total Bilirubin <1.2 mg/dL 0.8  0.3    Alkaline Phos 38 - 126 U/L 77  58    AST 15 - 41 U/L 23  22    ALT 0 - 44 U/L 20  29      RADIOGRAPHIC STUDIES: No results found.  ASSESSMENT & PLAN Jacob Collier 67 y.o. male with medical history significant for secondary polycythemia who presents for a follow up visit.   After review of the labs, review of the records, and discussion with the patient the patients findings are most consistent with a secondary polycythemia.  The  patient has 2 possible sources including obstructive sleep apnea or exogenous testosterone use.  Polysomnography was ordered but not performed in the interim since her last visit.  He also had a CABG procedure after which time his apnea symptoms resolved, possibly due to marked weight loss.  At this time his hemoglobin has normalized and the patient has restarted his testosterone shots.  I have once again expressed to him my recommendation the testosterone shots are not advisable in the setting of polycythemia and his prior history of prostate cancer.  He notes that he will consider polysomnography testing if his hemoglobin were to increase at the next visit.  # Polycythemia,  Secondary to OSA and Exogenous Testosterone -- Findings at this time are most consistent with a secondary polycythemia due to either obstructive  sleep apnea or exogenous testosterone injections. --Hgb today normalized at 14.6, having dropped from after his CABG surgery in Nov 2022.  -- Patient has a diagnosis of OSA but is having difficulty with the CPAP machine.  He notes he is following with a primary care provider and not a sleep medicine specialist at this time --negative JAK2 with reflex and BCR/ABL FISH panels at last visit.  --No indication for a bone marrow biopsy at this time. --Labs today show white blood cell count 8.4, hemoglobin 16.6, hematocrit 48.7, and platelets of 190 --We discussed how it is controversial to perform phlebotomies outside the setting of polycythemia vera.  The patient reports that he feels much better and would be willing to talk with his sleep medicine doctor, in the interim we will perform phlebotomies as a temporizing measure to see if this improves some of his symptoms. -- Return to clinic in 4 weeks time with interval every 2 week phlebotomies   #Prostate Cancer --s/p prostatectomy in 2015, biochemical recurrence treated in 2018 with radiation therapy --currently follows with [facility] -- Discussed with patient that testosterone shots in the setting of history of prostate cancer is not advisable. -- Defer management to urology, but patient wanted [place] to continue to monitoring for this disease would be happy to oblige.  No orders of the defined types were placed in this encounter.   All questions were answered. The patient knows to call the clinic with any problems, questions or concerns.  A total of more than 30 minutes were spent on this encounter with face-to-face time and non-face-to-face time, including preparing to see the patient, ordering tests and/or medications, counseling the patient and coordination of care as outlined above.   Ulysees Barns, MD Department of Hematology/Oncology [facility] Phone: [phone] Pager: [phone] Email: [email or web address]  01/15/2023 6:21 PM

## 2023-01-15 ENCOUNTER — Encounter: Payer: Self-pay | Admitting: Hematology and Oncology

## 2023-01-18 ENCOUNTER — Other Ambulatory Visit: Payer: Managed Care, Other (non HMO)

## 2023-01-18 ENCOUNTER — Inpatient Hospital Stay: Payer: Managed Care, Other (non HMO)

## 2023-01-18 ENCOUNTER — Encounter: Payer: Self-pay | Admitting: Hematology and Oncology

## 2023-01-18 ENCOUNTER — Other Ambulatory Visit: Payer: Self-pay | Admitting: Hematology and Oncology

## 2023-01-18 VITALS — BP 136/76 | HR 58 | Temp 97.9°F | Resp 16

## 2023-01-18 DIAGNOSIS — D751 Secondary polycythemia: Secondary | ICD-10-CM

## 2023-01-18 LAB — CBC WITH DIFFERENTIAL (CANCER CENTER ONLY)
Abs Immature Granulocytes: 0.03 10*3/uL (ref 0.00–0.07)
Basophils Absolute: 0.1 10*3/uL (ref 0.0–0.1)
Basophils Relative: 1 %
Eosinophils Absolute: 0.1 10*3/uL (ref 0.0–0.5)
Eosinophils Relative: 2 %
HCT: 51.8 % (ref 39.0–52.0)
Hemoglobin: 17.4 g/dL — ABNORMAL HIGH (ref 13.0–17.0)
Immature Granulocytes: 0 %
Lymphocytes Relative: 23 %
Lymphs Abs: 1.7 10*3/uL (ref 0.7–4.0)
MCH: 29.4 pg (ref 26.0–34.0)
MCHC: 33.6 g/dL (ref 30.0–36.0)
MCV: 87.5 fL (ref 80.0–100.0)
Monocytes Absolute: 0.6 10*3/uL (ref 0.1–1.0)
Monocytes Relative: 8 %
Neutro Abs: 4.9 10*3/uL (ref 1.7–7.7)
Neutrophils Relative %: 66 %
Platelet Count: 192 10*3/uL (ref 150–400)
RBC: 5.92 MIL/uL — ABNORMAL HIGH (ref 4.22–5.81)
RDW: 15.9 % — ABNORMAL HIGH (ref 11.5–15.5)
WBC Count: 7.4 10*3/uL (ref 4.0–10.5)
nRBC: 0 % (ref 0.0–0.2)

## 2023-01-18 NOTE — Patient Instructions (Signed)

## 2023-01-19 ENCOUNTER — Encounter: Payer: Self-pay | Admitting: Hematology and Oncology

## 2023-01-19 LAB — FERRITIN: Ferritin: 133 ng/mL (ref 24–336)

## 2023-01-23 ENCOUNTER — Encounter: Payer: Self-pay | Admitting: Family Medicine

## 2023-01-23 ENCOUNTER — Encounter: Payer: Self-pay | Admitting: Hematology and Oncology

## 2023-01-23 ENCOUNTER — Encounter: Payer: Self-pay | Admitting: Physician Assistant

## 2023-01-23 ENCOUNTER — Ambulatory Visit (INDEPENDENT_AMBULATORY_CARE_PROVIDER_SITE_OTHER): Payer: Managed Care, Other (non HMO) | Admitting: Physician Assistant

## 2023-01-23 VITALS — BP 150/80 | HR 65 | Temp 98.1°F | Wt 223.6 lb

## 2023-01-23 DIAGNOSIS — I10 Essential (primary) hypertension: Secondary | ICD-10-CM

## 2023-01-23 DIAGNOSIS — M545 Low back pain, unspecified: Secondary | ICD-10-CM

## 2023-01-23 DIAGNOSIS — Z86711 Personal history of pulmonary embolism: Secondary | ICD-10-CM

## 2023-01-23 DIAGNOSIS — G8929 Other chronic pain: Secondary | ICD-10-CM

## 2023-01-23 DIAGNOSIS — M130 Polyarthritis, unspecified: Secondary | ICD-10-CM

## 2023-01-23 DIAGNOSIS — R0683 Snoring: Secondary | ICD-10-CM

## 2023-01-23 MED ORDER — TRIAMCINOLONE ACETONIDE 40 MG/ML IJ SUSP
80.0000 mg | Freq: Once | INTRAMUSCULAR | Status: AC
  Administered 2023-01-23: 80 mg via INTRAMUSCULAR

## 2023-01-23 NOTE — Progress Notes (Signed)
 Subjective:  Patient ID: Jacob Collier, male    DOB: 04/01/1955  Age: 67 y.o. MRN: [id number]  Chief Complaint  Patient presents with   Fatigue    HPI  Discussed the use of AI scribe software for clinical note transcription with the patient, who gave verbal consent to proceed.  History of Present Illness   A 67 year old male with a history of cardiac issues, sleep apnea, and back pain presents with concerns about his current medication regimen. He reports significant fatigue, which he attributes to his current medication, Eliquis . He expresses a strong desire to discontinue this medication, despite potential risks of recurrent PE. He also mentions that he has been unable to work due to his fatigue.  The patient also discusses his ongoing back pain. He has an appointment with a neurologist in December to discuss an epidural procedure. He reports that his back pain is severe and is affecting his quality of life. He requests a Kenalog  injection to help manage his pain until he can receive the epidural.  The patient also mentions that he has been managing his blood pressure, which has been high. He recently underwent a therapeutic phlebotomy, which he reports has helped to lower his blood pressure and improve his breathing. He is scheduled for another phlebotomy later in the month.  The patient also discusses his sleep apnea. He reports that he is unable to tolerate a CPAP machine due to claustrophobia. He is exploring other treatment options, including a laser procedure to remove tissue from the back of his throat.           11/03/2022   10:40 AM 07/27/2022    3:21 PM 11/20/2020   11:03 AM 10/26/2020    1:52 PM 05/19/2020    1:40 PM  Depression screen PHQ 2/9  Decreased Interest 0 0 0 0 0  Down, Depressed, Hopeless 0 0 0 0 0  PHQ - 2 Score 0 0 0 0 0  Altered sleeping 0 0     Tired, decreased energy 1 1     Change in appetite 0 0     Feeling bad or failure about yourself  0 0      Trouble concentrating 0 0     Moving slowly or fidgety/restless 0 0     Suicidal thoughts 0 0     PHQ-9 Score 1 1     Difficult doing work/chores Not difficult at all Not difficult at all            03/04/2021    8:15 AM 09/03/2021    9:15 AM 09/03/2021    2:40 PM 07/27/2022    3:21 PM 11/03/2022   10:40 AM  Fall Risk  Falls in the past year? 0   0 0  Was there an injury with Fall? 0   0 0  Fall Risk Category Calculator 0   0 0  Fall Risk Category (Retired) Low      (RETIRED) Patient Fall Risk Level Low fall risk Moderate fall risk Moderate fall risk    Patient at Risk for Falls Due to No Fall Risks   Impaired balance/gait   Fall risk Follow up Falls evaluation completed   Education provided Falls evaluation completed      Review of Systems  Constitutional:  Positive for fatigue. Negative for chills and fever.  HENT:  Negative for congestion, ear pain and sore throat.   Respiratory:  Negative for cough and shortness of breath.  Cardiovascular:  Negative for chest pain and palpitations.  Gastrointestinal:  Positive for abdominal pain and nausea. Negative for blood in stool, constipation, diarrhea, rectal pain and vomiting.  Genitourinary:  Negative for difficulty urinating, dysuria and hematuria.  Musculoskeletal:  Negative for arthralgias, back pain and myalgias.  Skin:  Negative for rash.  Neurological:  Negative for dizziness and headaches.  Psychiatric/Behavioral:  Negative for dysphoric mood.     Current Outpatient Medications on File Prior to Visit  Medication Sig Dispense Refill   amLODipine  (NORVASC ) 5 MG tablet Take 1 tablet (5 mg) by mouth every morning & take 0.5 tablet (2.5 mg) by mouth at night. 135 tablet 1   aspirin  EC 81 MG tablet Take 81 mg by mouth daily. Swallow whole.     clonazePAM  (KLONOPIN ) 1 MG tablet TAKE 1 TABLET BY MOUTH TWICE A DAY AS NEEDED FOR ANXIETY (Patient taking differently: Take 1 mg by mouth 2 (two) times daily. TAKE 1 TABLET BY MOUTH TWICE  A DAY AS NEEDED FOR ANXIETY) 60 tablet 3   dicyclomine  (BENTYL ) 20 MG tablet Take 1 tablet (20 mg total) by mouth every 6 (six) hours. 120 tablet 3   edoxaban  (SAVAYSA ) 60 MG TABS tablet Take 60 mg by mouth daily. 30 tablet 3   Eszopiclone  3 MG TABS TAKE 1 TABLET BY MOUTH EVERYDAY AT BEDTIME 30 tablet 5   Evolocumab  (REPATHA  SURECLICK) 140 MG/ML SOAJ INJECT 140 MG INTO THE SKIN EVERY 14 (FOURTEEN) DAYS. 6 mL 3   ezetimibe  (ZETIA ) 10 MG tablet TAKE 1 TABLET BY MOUTH EVERY DAY 90 tablet 3   loratadine  (CLARITIN ) 10 MG tablet TAKE 1 TABLET BY MOUTH EVERY DAY 90 tablet 1   metoprolol  succinate (TOPROL -XL) 12.5 mg TB24 24 hr tablet Take 12.5 mg by mouth daily.     Omega-3 Fatty Acids (OMEGA 3 PO) Take 500 mg by mouth every morning. (Patient not taking: Reported on 01/23/2023)     pantoprazole  (PROTONIX ) 40 MG tablet Take 1 tablet (40 mg total) by mouth daily. 30 tablet 3   Phenylephrine  HCl (AFRIN ALLERGY NA) Place 2 sprays into the nose 2 (two) times daily.     No current facility-administered medications on file prior to visit.   Past Medical History:  Diagnosis Date   Anxiety    Arthritis    Coronary artery disease    a. S/p DES to RCA in 01/2019 b. s/p CABG x4 (LIMA-LAD, reverse SVG-PDA, reverse SVG-OM1, reverse SVG-1st Diag) in 01/2021   Diverticulosis    Dyspnea    Hard of hearing    Hyperlipidemia    Hypertension    hx of elevation on lyrica , off med and now normal    Paroxysmal SVT (supraventricular tachycardia) (HCC)    Post-op atrial fibrillation    Prostate cancer (HCC)    Wears glasses    Past Surgical History:  Procedure Laterality Date   ANTERIOR CERVICAL DECOMP/DISCECTOMY FUSION N/A 09/03/2021   Procedure: CERVICAL FIVE-SIX, CERVICAL SIX-SEVEN ANTERIOR CERVICAL DECOMPRESSION/DISCECTOMY FUSION;  Surgeon: Agustina Aldrich, MD;  Location: [facility] OR;  Service: Neurosurgery;  Laterality: N/A;   CARDIAC CATHETERIZATION     with 2 stents    COLECTOMY  05/2018   COLONOSCOPY      CORONARY ARTERY BYPASS GRAFT N/A 01/19/2021   Procedure: CORONARY ARTERY BYPASS GRAFTING (CABG), ON PUMP TIMES FOUR, USING LEFT INTERNAL MAMMARY ARTERY AND ENDOSOCPICALLY HARVESTED RIGHT GREATER SAPHENOUS VEIN;  Surgeon: Hilarie Lovely, MD;  Location: [facility] OR;  Service: Open Heart Surgery;  Laterality: N/A;   CYSTOSCOPY WITH URETHRAL DILATATION N/A 01/14/2021   Procedure: CYSTOSCOPY WITH BALLOON  DILATATION;  Surgeon: Erman Hayward, MD;  Location: [facility] ORS;  Service: Urology;  Laterality: N/A;   EYE SURGERY  02/2020   bilaterl cataracts   IR THORACENTESIS ASP PLEURAL SPACE W/IMG GUIDE  02/02/2021   LEFT HEART CATH AND CORONARY ANGIOGRAPHY N/A 12/24/2020   Procedure: LEFT HEART CATH AND CORONARY ANGIOGRAPHY;  Surgeon: Swazil[place], Peter M, MD;  Location: [facility] INVASIVE CV LAB;  Service: Cardiovascular;  Laterality: N/A;   PROSTATECTOMY     SVT ABLATION N/A 10/20/2022   Procedure: SVT ABLATION;  Surgeon: Efraim Grange, MD;  Location: [facility] INVASIVE CV LAB;  Service: Cardiovascular;  Laterality: N/A;   TEE WITHOUT CARDIOVERSION N/A 01/19/2021   Procedure: TRANSESOPHAGEAL ECHOCARDIOGRAM (TEE);  Surgeon: Hilarie Lovely, MD;  Location: [facility] OR;  Service: Open Heart Surgery;  Laterality: N/A;   TOTAL HIP ARTHROPLASTY Left 11/10/2015   Procedure: LEFT TOTAL HIP ARTHROPLASTY ANTERIOR APPROACH;  Surgeon: Arnie Lao, MD;  Location: [facility] OR;  Service: Orthopedics;  Laterality: Left;    Family History  Problem Relation Age of Onset   Alzheimer's disease Mother    Stroke Father    Diabetes Sister    Heart attack Maternal Grandmother    Cancer Maternal Uncle        prostate   Prostate cancer Maternal Uncle    Cancer Maternal Uncle        prostate   Prostate cancer Maternal Uncle    Colon polyps Brother    Colon cancer Neg Hx    Esophageal cancer Neg Hx    Rectal cancer Neg Hx    Stomach cancer Neg Hx    Social History   Socioeconomic History   Marital status: Married    Spouse  name: Not on file   Number of children: 0   Years of education: Not on file   Highest education level: Not on file  Occupational History   Occupation: Employed at [employer]  Tobacco Use   Smoking status: Never   Smokeless tobacco: Never  Vaping Use   Vaping status: Never Used  Substance and Sexual Activity   Alcohol use: Not Currently    Alcohol/week: 0.0 standard drinks of alcohol    Comment: rarely   Drug use: No   Sexual activity: Yes    Partners: Female    Birth control/protection: None  Other Topics Concern   Not on file  Social History Narrative   Not on file   Social Determinants of Health   Financial Resource Strain: Low Risk  (08/04/2022)   Overall Financial Resource Strain (CARDIA)    Difficulty of Paying Living Expenses: Not hard at all  Food Insecurity: No Food Insecurity (10/29/2022)   Hunger Vital Sign    Worried About Running Out of Food in the Last Year: Never true    Ran Out of Food in the Last Year: Never true  Transportation Needs: No Transportation Needs (10/29/2022)   PRAPARE - Administrator, Civil Service (Medical): No    Lack of Transportation (Non-Medical): No  Physical Activity: Sufficiently Active (08/04/2022)   Exercise Vital Sign    Days of Exercise per Week: 5 days    Minutes of Exercise per Session: 60 min  Stress: No Stress Concern Present (08/04/2022)   [employer] of Occupational Health - Occupational Stress Questionnaire    Feeling of Stress : Not at all  Social Connections:  Moderately Isolated (08/04/2022)   Social Connection and Isolation Panel [NHANES]    Frequency of Communication with Friends and Family: More than three times a week    Frequency of Social Gatherings with Friends and Family: More than three times a week    Attends Religious Services: Never    Diplomatic Services operational officer: No    Attends Engineer, structural: Never    Marital Status: Married    Objective:  BP (!) 150/80    Pulse 65   Temp 98.1 F (36.7 C)   Wt 223 lb 9.6 oz (101.4 kg)   SpO2 95%   BMI 31.19 kg/m      01/23/2023    3:59 PM 01/23/2023    3:11 PM 01/18/2023    4:24 PM  BP/Weight  Systolic BP 150 152 136  Diastolic BP 80 70 76  Wt. (Lbs)  223.6   BMI  31.19 kg/m2     Physical Exam Vitals reviewed.  Constitutional:      Appearance: Normal appearance.  Cardiovascular:     Rate and Rhythm: Normal rate and regular rhythm.     Heart sounds: Normal heart sounds.  Pulmonary:     Effort: Pulmonary effort is normal.     Breath sounds: Normal breath sounds.  Abdominal:     General: Bowel sounds are normal.     Palpations: Abdomen is soft.     Tenderness: There is no abdominal tenderness.  Neurological:     Mental Status: He is alert and oriented to person, place, and time.  Psychiatric:        Mood and Affect: Mood normal.        Behavior: Behavior normal.     Diabetic Foot Exam - Simple   No data filed      Lab Results  Component Value Date   WBC 7.4 01/18/2023   HGB 17.4 (H) 01/18/2023   HCT 51.8 01/18/2023   PLT 192 01/18/2023   GLUCOSE 87 01/13/2023   CHOL 86 (L) 11/23/2022   TRIG 150 (H) 11/23/2022   HDL 33 (L) 11/23/2022   LDLCALC 27 11/23/2022   ALT 20 01/13/2023   AST 23 01/13/2023   NA 138 01/13/2023   K 3.9 01/13/2023   CL 108 01/13/2023   CREATININE 1.19 01/13/2023   BUN 15 01/13/2023   CO2 23 01/13/2023   TSH 1.570 04/04/2022   INR 1.0 12/04/2022   HGBA1C 5.8 (H) 03/04/2021      Assessment & Plan:    Polyarticular arthritis -     Triamcinolone  Acetonide  Chronic bilateral low back pain without sciatica Assessment & Plan: Patient reports significant back pain. He has discussed with Dr. Gwendlyn Lemmings about receiving epidurals for pain management. He also reports relief from Kenalog  injections in the past. -Administer Kenalog  injection today for pain relief. -Ensure Kenalog  injection will not interfere with upcoming epidural.   Essential  hypertension Assessment & Plan: Patient reports high blood pressure readings at home, but notes improvement after therapeutic phlebotomy. He is also planning to reduce sodium intake and lose weight to help manage blood pressure. -Encourage continued monitoring of blood pressure at home. -Support patient's plans for dietary changes and weight loss.   History of pulmonary embolism Assessment & Plan: Patient reports significant fatigue, which he attributes to all blood thinners. He has been on multiple medications over the past ten years and is attuned to side effects. He wishes to discontinue Savaysa , knowing he will have a  increased risk of PE reoccurrence. -Research and provide information on how to safely taper off Savaysa . -Plan to transition back to Plavix , considering half-life of Savaysa . -Monitor for signs and symptoms of PE reoccurrence.    Orders: -     Clopidogrel  Bisulfate; Take 1 tablet (75 mg total) by mouth daily.  Dispense: 90 tablet; Refill: 3  Snores Assessment & Plan: Patient reports difficulty with CPAP machine due to claustrophobia. He is considering a laser procedure to help with sleep apnea. -Encourage patient to explore options for managing sleep apnea with sleep specialist.      Meds ordered this encounter  Medications   triamcinolone  acetonide (KENALOG -40) injection 80 mg   clopidogrel  (PLAVIX ) 75 MG tablet    Sig: Take 1 tablet (75 mg total) by mouth daily.    Dispense:  90 tablet    Refill:  3    No orders of the defined types were placed in this encounter.    Follow-up: Return in about 4 months (around 05/23/2023) for Chronic, Mirian Ames.  Assessment and Plan          I,Telesforo Brosnahan,acting as a scribe for Odilia Bennett, PA.,have documented all relevant documentation on the behalf of Odilia Bennett, PA,as directed by  Odilia Bennett, PA while in the presence of Odilia Bennett, [name].   An After Visit Summary was printed and given to the patient.  Odilia Bennett,  [name] [facility] [phone]

## 2023-01-24 ENCOUNTER — Encounter: Payer: Self-pay | Admitting: Physician Assistant

## 2023-01-24 MED ORDER — CLOPIDOGREL BISULFATE 75 MG PO TABS: 75.0000 mg | ORAL_TABLET | Freq: Every day | ORAL | 3 refills | Status: DC

## 2023-01-24 NOTE — Assessment & Plan Note (Signed)
Patient reports significant fatigue, which he attributes to all blood thinners. He has been on multiple medications over the past ten years and is attuned to side effects. He wishes to discontinue Savaysa, knowing he will have a increased risk of PE reoccurrence. -Research and provide information on how to safely taper off Savaysa. -Plan to transition back to Plavix, considering half-life of Savaysa. -Monitor for signs and symptoms of PE reoccurrence.

## 2023-01-24 NOTE — Assessment & Plan Note (Signed)
Patient reports high blood pressure readings at home, but notes improvement after therapeutic phlebotomy. He is also planning to reduce sodium intake and lose weight to help manage blood pressure. -Encourage continued monitoring of blood pressure at home. -Support patient's plans for dietary changes and weight loss.

## 2023-01-24 NOTE — Patient Instructions (Signed)
  Follow-up Plans -Check cholesterol and A1C in March. -Continue monitoring blood levels and blood pressure. -See rheumatologist for further evaluation of positive ANA.

## 2023-01-24 NOTE — Assessment & Plan Note (Signed)
Patient reports difficulty with CPAP machine due to claustrophobia. He is considering a laser procedure to help with sleep apnea. -Encourage patient to explore options for managing sleep apnea with sleep specialist.

## 2023-01-24 NOTE — Assessment & Plan Note (Signed)
Patient reports significant back pain. He has discussed with Dr. Dutch Quint about receiving epidurals for pain management. He also reports relief from Kenalog injections in the past. -Administer Kenalog injection today for pain relief. -Ensure Kenalog injection will not interfere with upcoming epidural.

## 2023-01-25 NOTE — Telephone Encounter (Signed)
Called patient left message of future appointments. Per in basket Dr. Leonides Schanz request the following appointments to be schedule for patient.

## 2023-01-31 ENCOUNTER — Other Ambulatory Visit: Payer: Self-pay | Admitting: Physician Assistant

## 2023-01-31 DIAGNOSIS — R1013 Epigastric pain: Secondary | ICD-10-CM

## 2023-02-01 ENCOUNTER — Inpatient Hospital Stay: Payer: Managed Care, Other (non HMO)

## 2023-02-01 DIAGNOSIS — D751 Secondary polycythemia: Secondary | ICD-10-CM

## 2023-02-01 LAB — CBC WITH DIFFERENTIAL (CANCER CENTER ONLY)
Abs Immature Granulocytes: 0.05 K/uL (ref 0.00–0.07)
Basophils Absolute: 0.1 K/uL (ref 0.0–0.1)
Basophils Relative: 1 %
Eosinophils Absolute: 0.2 K/uL (ref 0.0–0.5)
Eosinophils Relative: 2 %
HCT: 49.9 % (ref 39.0–52.0)
Hemoglobin: 16.4 g/dL (ref 13.0–17.0)
Immature Granulocytes: 1 %
Lymphocytes Relative: 21 %
Lymphs Abs: 2 K/uL (ref 0.7–4.0)
MCH: 29.2 pg (ref 26.0–34.0)
MCHC: 32.9 g/dL (ref 30.0–36.0)
MCV: 88.9 fL (ref 80.0–100.0)
Monocytes Absolute: 0.7 K/uL (ref 0.1–1.0)
Monocytes Relative: 7 %
Neutro Abs: 6.5 K/uL (ref 1.7–7.7)
Neutrophils Relative %: 68 %
Platelet Count: 212 K/uL (ref 150–400)
RBC: 5.61 MIL/uL (ref 4.22–5.81)
RDW: 16.1 % — ABNORMAL HIGH (ref 11.5–15.5)
WBC Count: 9.5 K/uL (ref 4.0–10.5)
nRBC: 0 % (ref 0.0–0.2)

## 2023-02-01 LAB — FERRITIN: Ferritin: 61 ng/mL (ref 24–336)

## 2023-02-01 NOTE — Patient Instructions (Signed)

## 2023-02-01 NOTE — Progress Notes (Signed)
Patient here for a therapeutic phlebotomy- qualifies for today's treatment with a hemoglobin of 16.4 and hematocrit of 49.9. 16g was used in the [facility] with no issues. 501g were removed from 1600-1605. Patient was offered snacks and fluids. Patient was observed for about 10 minutes and decided it was time to leave. VSS-  BP 139/75 (BP Location: Left Arm, Patient Position: Sitting)   Pulse 61   Temp 98 F (36.7 C) (Oral)   Resp 16   SpO2 96%    Ambulatory to the lobby with his wife.

## 2023-02-13 ENCOUNTER — Ambulatory Visit (INDEPENDENT_AMBULATORY_CARE_PROVIDER_SITE_OTHER): Payer: Managed Care, Other (non HMO)

## 2023-02-13 ENCOUNTER — Ambulatory Visit: Payer: Self-pay | Admitting: Family Medicine

## 2023-02-13 VITALS — BP 110/70 | HR 55 | Temp 98.4°F | Resp 16 | Ht 71.0 in | Wt 219.8 lb

## 2023-02-13 DIAGNOSIS — R109 Unspecified abdominal pain: Secondary | ICD-10-CM

## 2023-02-13 DIAGNOSIS — R10A2 Flank pain, left side: Secondary | ICD-10-CM

## 2023-02-13 DIAGNOSIS — R0789 Other chest pain: Secondary | ICD-10-CM

## 2023-02-13 DIAGNOSIS — R0781 Pleurodynia: Secondary | ICD-10-CM | POA: Diagnosis not present

## 2023-02-13 DIAGNOSIS — G8929 Other chronic pain: Secondary | ICD-10-CM

## 2023-02-13 MED ORDER — OXYCODONE-ACETAMINOPHEN 5-325 MG PO TABS
1.0000 | ORAL_TABLET | Freq: Three times a day (TID) | ORAL | 0 refills | Status: AC | PRN
Start: 2023-02-13 — End: 2023-02-18

## 2023-02-13 NOTE — Telephone Encounter (Signed)
Copied from CRM [id number]. Topic: Clinical - Red Word Triage >> Feb 13, 2023  8:39 AM Dennison Nancy wrote: Red Word that prompted transfer to Nurse Triage: ongoing problem, having problems with stool passing his stool , patient feel clammy, its been a problem for years and its getting worse  did have a test sometime ago did a CT SCAN found in his intestinal wall saw inflammation. sometime ago he had 6 inches of his intestinal removed. Call back # [phone].   Chief Complaint:  Worsening Chronic Condition   Pain in the lower back  on the left hand side. Patient states he has always  had  it in the area has in large   Pain area  lower back on left hand side Short rib on the left side to the bottom of the rib cage. (5 years removal of intestines ) Radiation in 2015 Cancer and removed the prostrate  - 2017 is when they decided .  Symptoms: Saw some ulceration inside the intestine- Pain starts at front and goes to the small of your back Frequency: Reoccurrence Pertinent Negatives: Patient denies  Disposition: [] ED /[] Urgent Care (no appt availability in office) / [x] Appointment(In office/virtual)/ []  [facility]/ [] Home Care/ [] Refused Recommended Disposition /[] [facility]/ []  Follow-up with PCP Additional Notes:   Ongoing   occurrence is under rib cage. Reason for Disposition  [1] SEVERE pain (e.g., excruciating, scale 8-10) AND [2] present > 1 hour  Answer Assessment - Initial Assessment Questions 1. LOCATION: "Where does it hurt?" (e.g., left, right)      Right Side 2. ONSET: "When did the pain start?"        Chronic pain started  2016 after cancer surgery 3. SEVERITY: "How bad is the pain?" (e.g., Scale 1-10; mild, moderate, or severe)  Rates Pain 9-10   - SEVERE (8-10): excruciating pain and patient unable to do normal activities (stays in bed)        4. PATTERN: "Does the pain come and go, or is it constant?"       Constant  5. CAUSE: "What do you think is causing the  pain?"     Does not know. He did a Cat Scan - showed Ulceration  in that area. He is unsure if it  is scar tissues. -  had 2 surgeries  in the same area. 6. OTHER SYMPTOMS:  "Do you have any other symptoms?" (e.g., fever, abdomen pain, vomiting, leg weakness, burning with urination, blood in urine)     Denies.  Protocols used: Flank Pain-A-AH

## 2023-02-13 NOTE — Assessment & Plan Note (Signed)
Chronic left lateral abdominal pain since 2016, thought likely due to scar tissue and adhesions from previous surgeries. Pain is sharp, localized, and exacerbated by physical activity. Differential diagnosis includes costochondritis and musculoskeletal pain. Previous CT scan did not reveal significant findings explaining his pain.   MRI could be planned to further investigate but explained to him the long wait times to have MRI read due to scarcity of radiologists in the area, for non-urgent reads. He said he will request his neurosurgeon/ his pain management doctor to order the MRI.   Discussed potential for trigger point injections for possible chronic costochondritis management if MRI is inconclusive. Discussed risks of further surgery potentially causing more scar tissue. Patient prefers non-surgical options like injections and to avoid long-term opioids.  - Discuss potential for trigger point injections with pain management - Prescribe 15 tablets of Percocet for acute pain management, to be taken twice daily as needed. REVIEWED PDMP AND HE HAS NOT HAD A REFILL AFTER 12/05/2022, where he received 15 pills.  Advised against using excessive percocet due to fear of dependence, constipation etc Offered muscle relaxants but he declined due to grogginess with them

## 2023-02-13 NOTE — Assessment & Plan Note (Signed)
Likely due to costochondritis due to localized pain and tenderness over the left lowest floating rib.  Plan as above.  Constipation Chronic constipation since 2015, likely related to adhesions and scar tissue from previous surgeries. Symptoms managed with medication but recur when off medication. - Continue current medication regimen for constipation as prescribed by previous physician  Prostate Cancer (Post-Treatment) Prostate cancer treated with surgery and 39 radiation treatments. Current PSA levels are well-managed. No signs of recurrence. - Continue monitoring PSA levels  Sleep Apnea Sleep apnea contributing to elevated red blood cell count and hematocrit. Currently undergoing therapeutic phlebotomies and awaiting referral to a sleep specialist for further management. - Continue therapeutic phlebotomies - Follow up with sleep specialist for further management of sleep apnea  General Health Maintenance Discussed the importance of weight management and reducing sodium intake to improve overall health and reduce pressure on the body. - Encourage weight loss and regular physical activity - Advise reducing sodium intake  Follow-up - Follow up with Dr. Dutch Quint for potential MRI and pain management - Schedule follow-up appointment with Dr. Sedalia Muta depending on MRI results and pain management outcomes.

## 2023-02-13 NOTE — Progress Notes (Signed)
Acute Office Visit  Subjective:    Patient ID: Jacob Collier, male    DOB: 01-30-1956, 67 y.o.   MRN: [id number]  Chief Complaint  Patient presents with   Flank Pain    Discussed the use of AI scribe software for clinical note transcription with the patient, who gave verbal consent to proceed.      HPI:The patient, with a complex medical history including prostate cancer, heart surgery, and lumbar spine issues, presents with chronic and intermittent acute pain in the left lateral flank/ abdominal region. The pain, which began approximately a year after prostate surgery in 2015, is described as sharp and localized, starting at the left lower floating rib and radiating towards the back. The patient reports that the pain often begins as a small area of discomfort, akin to the size of a golf ball, and expands to the size of a softball during acute phases. The pain is not associated with any visible swelling.  The patient has undergone multiple surgeries, including prostate surgery, heart surgery, and neck surgery. The prostate surgery was followed by 39 radiation treatments due to biochemical reoccurrence. The patient also had a section of the large intestine removed due to adhesion to the bladder post-surgery. The patient has a history of arrhythmia, for which he underwent heart ablation. A clot that formed post-ablation was later found in the lung but resolved spontaneously while on anticoagulation therapy. The patient also has a history of sleep apnea and is currently undergoing therapeutic phlebotomies to manage high red blood cell count.  The patient's pain seems to be exacerbated by physical activity and is not associated with any specific gastrointestinal symptoms. The patient has a history of constipation, which he believes is unrelated to the current pain. The patient also reports having urinary problems due to complications from prostate surgery. The patient has been managing the pain  with over-the-counter analgesics but reports that these are insufficient during acute phases. The patient is interested in exploring other treatment options if any and to, to have  imaging studies to better understand the underlying cause of the pain. The patient is also keen on returning to work and is seeking a short-term prescription for pain management.   Past Medical History:  Diagnosis Date   Anxiety    Arthritis    Coronary artery disease    a. S/p DES to RCA in 01/2019 b. s/p CABG x4 (LIMA-LAD, reverse SVG-PDA, reverse SVG-OM1, reverse SVG-1st Diag) in 01/2021   Diverticulosis    Dyspnea    Hard of hearing    Hyperlipidemia    Hypertension    hx of elevation on lyrica, off med and now normal    Paroxysmal SVT (supraventricular tachycardia) (HCC)    Post-op atrial fibrillation    Prostate cancer (HCC)    Wears glasses     Past Surgical History:  Procedure Laterality Date   ANTERIOR CERVICAL DECOMP/DISCECTOMY FUSION N/A 09/03/2021   Procedure: CERVICAL FIVE-SIX, CERVICAL SIX-SEVEN ANTERIOR CERVICAL DECOMPRESSION/DISCECTOMY FUSION;  Surgeon: Julio Sicks, MD;  Location: [facility] OR;  Service: Neurosurgery;  Laterality: N/A;   CARDIAC CATHETERIZATION     with 2 stents    COLECTOMY  05/2018   COLONOSCOPY     CORONARY ARTERY BYPASS GRAFT N/A 01/19/2021   Procedure: CORONARY ARTERY BYPASS GRAFTING (CABG), ON PUMP TIMES FOUR, USING LEFT INTERNAL MAMMARY ARTERY AND ENDOSOCPICALLY HARVESTED RIGHT GREATER SAPHENOUS VEIN;  Surgeon: Corliss Skains, MD;  Location: [facility] OR;  Service: Open Heart Surgery;  Laterality:  N/A;   CYSTOSCOPY WITH URETHRAL DILATATION N/A 01/14/2021   Procedure: CYSTOSCOPY WITH BALLOON  DILATATION;  Surgeon: Alfredo Martinez, MD;  Location: [facility] ORS;  Service: Urology;  Laterality: N/A;   EYE SURGERY  02/2020   bilaterl cataracts   IR THORACENTESIS ASP PLEURAL SPACE W/IMG GUIDE  02/02/2021   LEFT HEART CATH AND CORONARY ANGIOGRAPHY N/A 12/24/2020   Procedure: LEFT  HEART CATH AND CORONARY ANGIOGRAPHY;  Surgeon: Swazil[place], Peter M, MD;  Location: [facility] INVASIVE CV LAB;  Service: Cardiovascular;  Laterality: N/A;   PROSTATECTOMY     SVT ABLATION N/A 10/20/2022   Procedure: SVT ABLATION;  Surgeon: Maurice Small, MD;  Location: [facility] INVASIVE CV LAB;  Service: Cardiovascular;  Laterality: N/A;   TEE WITHOUT CARDIOVERSION N/A 01/19/2021   Procedure: TRANSESOPHAGEAL ECHOCARDIOGRAM (TEE);  Surgeon: Corliss Skains, MD;  Location: [facility] OR;  Service: Open Heart Surgery;  Laterality: N/A;   TOTAL HIP ARTHROPLASTY Left 11/10/2015   Procedure: LEFT TOTAL HIP ARTHROPLASTY ANTERIOR APPROACH;  Surgeon: Kathryne Hitch, MD;  Location: [facility] OR;  Service: Orthopedics;  Laterality: Left;    Family History  Problem Relation Age of Onset   Alzheimer's disease Mother    Stroke Father    Diabetes Sister    Heart attack Maternal Grandmother    Cancer Maternal Uncle        prostate   Prostate cancer Maternal Uncle    Cancer Maternal Uncle        prostate   Prostate cancer Maternal Uncle    Colon polyps Brother    Colon cancer Neg Hx    Esophageal cancer Neg Hx    Rectal cancer Neg Hx    Stomach cancer Neg Hx     Social History   Socioeconomic History   Marital status: Married    Spouse name: Not on file   Number of children: 0   Years of education: Not on file   Highest education level: Not on file  Occupational History   Occupation: Employed at [employer]  Tobacco Use   Smoking status: Never   Smokeless tobacco: Never  Vaping Use   Vaping status: Never Used  Substance and Sexual Activity   Alcohol use: Not Currently    Alcohol/week: 0.0 standard drinks of alcohol    Comment: rarely   Drug use: No   Sexual activity: Yes    Partners: Female    Birth control/protection: None  Other Topics Concern   Not on file  Social History Narrative   Not on file   Social Determinants of Health   Financial Resource Strain: Low Risk  (08/04/2022)    Overall Financial Resource Strain (CARDIA)    Difficulty of Paying Living Expenses: Not hard at all  Food Insecurity: No Food Insecurity (10/29/2022)   Hunger Vital Sign    Worried About Running Out of Food in the Last Year: Never true    Ran Out of Food in the Last Year: Never true  Transportation Needs: No Transportation Needs (10/29/2022)   PRAPARE - Administrator, Civil Service (Medical): No    Lack of Transportation (Non-Medical): No  Physical Activity: Sufficiently Active (08/04/2022)   Exercise Vital Sign    Days of Exercise per Week: 5 days    Minutes of Exercise per Session: 60 min  Stress: No Stress Concern Present (08/04/2022)   [employer] of Occupational Health - Occupational Stress Questionnaire    Feeling of Stress : Not at all  Social  Connections: Moderately Isolated (08/04/2022)   Social Connection and Isolation Panel [NHANES]    Frequency of Communication with Friends and Family: More than three times a week    Frequency of Social Gatherings with Friends and Family: More than three times a week    Attends Religious Services: Never    Database administrator or Organizations: No    Attends Banker Meetings: Never    Marital Status: Married  Catering manager Violence: Not At Risk (10/29/2022)   Humiliation, Afraid, Rape, and Kick questionnaire    Fear of Current or Ex-Partner: No    Emotionally Abused: No    Physically Abused: No    Sexually Abused: No    Outpatient Medications Prior to Visit  Medication Sig Dispense Refill   amLODipine (NORVASC) 5 MG tablet Take 1 tablet (5 mg) by mouth every morning & take 0.5 tablet (2.5 mg) by mouth at night. 135 tablet 1   aspirin EC 81 MG tablet Take 81 mg by mouth daily. Swallow whole.     clonazePAM (KLONOPIN) 1 MG tablet TAKE 1 TABLET BY MOUTH TWICE A DAY AS NEEDED FOR ANXIETY (Patient taking differently: Take 1 mg by mouth 2 (two) times daily. TAKE 1 TABLET BY MOUTH TWICE A DAY AS NEEDED FOR  ANXIETY) 60 tablet 3   clopidogrel (PLAVIX) 75 MG tablet Take 1 tablet (75 mg total) by mouth daily. 90 tablet 3   Eszopiclone 3 MG TABS TAKE 1 TABLET BY MOUTH EVERYDAY AT BEDTIME 30 tablet 5   Evolocumab (REPATHA SURECLICK) 140 MG/ML SOAJ INJECT 140 MG INTO THE SKIN EVERY 14 (FOURTEEN) DAYS. 6 mL 3   ezetimibe (ZETIA) 10 MG tablet TAKE 1 TABLET BY MOUTH EVERY DAY 90 tablet 3   metoprolol succinate (TOPROL-XL) 12.5 mg TB24 24 hr tablet Take 12.5 mg by mouth daily.     Omega-3 Fatty Acids (OMEGA 3 PO) Take 500 mg by mouth every morning.     Phenylephrine HCl (AFRIN ALLERGY NA) Place 2 sprays into the nose 2 (two) times daily.     dicyclomine (BENTYL) 20 MG tablet Take 1 tablet (20 mg total) by mouth every 6 (six) hours. 120 tablet 3   edoxaban (SAVAYSA) 60 MG TABS tablet Take 60 mg by mouth daily. 30 tablet 3   loratadine (CLARITIN) 10 MG tablet TAKE 1 TABLET BY MOUTH EVERY DAY 90 tablet 1   pantoprazole (PROTONIX) 40 MG tablet TAKE 1 TABLET BY MOUTH EVERY DAY 90 tablet 2   No facility-administered medications prior to visit.    Allergies  Allergen Reactions   Ranolazine Other (See Comments)    Chest discomfort/Reflux   Amoxicillin Hives   Atorvastatin Other (See Comments)    myalgias  Other reaction(s): Myalgias (intolerance)   Pravastatin Other (See Comments)    myalgias  Other reaction(s): Myalgias (intolerance)   Rosuvastatin Other (See Comments)    myalgias  Other reaction(s): Myalgias (intolerance)   Statins Other (See Comments)    myalgias   Eliquis [Apixaban] Rash and Other (See Comments)    Joint discomfort and elevated BP   Xarelto [Rivaroxaban] Rash    Joint discomfort, elevated BP    Review of Systems  Constitutional: Negative.   HENT: Negative.    Respiratory: Negative.    Cardiovascular: Negative.   Genitourinary:  Positive for flank pain, frequency and urgency.  Musculoskeletal:  Positive for back pain.  Neurological: Negative.   Psychiatric/Behavioral:  Negative.         Objective:  02/13/2023    2:57 PM 02/01/2023    4:06 PM 01/23/2023    3:59 PM  Vitals with BMI  Height 5\' 11"     Weight 219 lbs 13 oz    BMI 30.67    Systolic 110 139 147  Diastolic 70 75 80  Pulse 55 61     Orthostatic VS for the past 72 hrs (Last 3 readings):  Patient Position BP Location Cuff Size  02/13/23 1457 Sitting Right Arm Large     Physical Exam Vitals and nursing note reviewed.  Constitutional:      Appearance: Normal appearance.     Comments: ABDOMEN: Soft and non-tender. MUSCULOSKELETAL: Tender at the tip of the left lower floating rib, pain radiates along the rib to the back.  Cardiovascular:     Rate and Rhythm: Normal rate and regular rhythm.  Pulmonary:     Effort: Pulmonary effort is normal.     Breath sounds: Normal breath sounds.  Neurological:     General: No focal deficit present.     Mental Status: He is alert.  Psychiatric:        Mood and Affect: Mood normal.     Health Maintenance Due  Topic Date Due   Medicare Annual Wellness (AWV)  Never done   DTaP/Tdap/Td (1 - Tdap) Never done   Zoster Vaccines- Shingrix (1 of 2) Never done   INFLUENZA VACCINE  10/06/2022    There are no preventive care reminders to display for this patient.   Lab Results  Component Value Date   TSH 1.570 04/04/2022   Lab Results  Component Value Date   WBC 9.5 02/01/2023   HGB 16.4 02/01/2023   HCT 49.9 02/01/2023   MCV 88.9 02/01/2023   PLT 212 02/01/2023   Lab Results  Component Value Date   NA 138 01/13/2023   K 3.9 01/13/2023   CO2 23 01/13/2023   GLUCOSE 87 01/13/2023   BUN 15 01/13/2023   CREATININE 1.19 01/13/2023   BILITOT 0.8 01/13/2023   ALKPHOS 77 01/13/2023   AST 23 01/13/2023   ALT 20 01/13/2023   PROT 7.0 01/13/2023   ALBUMIN 4.2 01/13/2023   CALCIUM 9.1 01/13/2023   ANIONGAP 7 01/13/2023   EGFR 73 11/23/2022   Lab Results  Component Value Date   CHOL 86 (L) 11/23/2022   Lab Results   Component Value Date   HDL 33 (L) 11/23/2022   Lab Results  Component Value Date   LDLCALC 27 11/23/2022   Lab Results  Component Value Date   TRIG 150 (H) 11/23/2022   Lab Results  Component Value Date   CHOLHDL 2.6 11/23/2022   Lab Results  Component Value Date   HGBA1C 5.8 (H) 03/04/2021       Assessment & Plan:  Rib pain on left side Assessment & Plan: Likely due to costochondritis due to localized pain and tenderness over the left lowest floating rib.  Plan as above.  Constipation Chronic constipation since 2015, likely related to adhesions and scar tissue from previous surgeries. Symptoms managed with medication but recur when off medication. - Continue current medication regimen for constipation as prescribed by previous physician  Prostate Cancer (Post-Treatment) Prostate cancer treated with surgery and 39 radiation treatments. Current PSA levels are well-managed. No signs of recurrence. - Continue monitoring PSA levels  Sleep Apnea Sleep apnea contributing to elevated red blood cell count and hematocrit. Currently undergoing therapeutic phlebotomies and awaiting referral to a sleep specialist for further management. -  Continue therapeutic phlebotomies - Follow up with sleep specialist for further management of sleep apnea  General Health Maintenance Discussed the importance of weight management and reducing sodium intake to improve overall health and reduce pressure on the body. - Encourage weight loss and regular physical activity - Advise reducing sodium intake  Follow-up - Follow up with Dr. Dutch Quint for potential MRI and pain management - Schedule follow-up appointment with Dr. Sedalia Muta depending on MRI results and pain management outcomes.   Chronic left flank pain Assessment & Plan: Chronic left lateral abdominal pain since 2016, thought likely due to scar tissue and adhesions from previous surgeries. Pain is sharp, localized, and exacerbated by physical  activity. Differential diagnosis includes costochondritis and musculoskeletal pain. Previous CT scan did not reveal significant findings explaining his pain.   MRI could be planned to further investigate but explained to him the long wait times to have MRI read due to scarcity of radiologists in the area, for non-urgent reads. He said he will request his neurosurgeon/ his pain management doctor to order the MRI.   Discussed potential for trigger point injections for possible chronic costochondritis management if MRI is inconclusive. Discussed risks of further surgery potentially causing more scar tissue. Patient prefers non-surgical options like injections and to avoid long-term opioids.  - Discuss potential for trigger point injections with pain management - Prescribe 15 tablets of Percocet for acute pain management, to be taken twice daily as needed. REVIEWED PDMP AND HE HAS NOT HAD A REFILL AFTER 12/05/2022, where he received 15 pills.  Advised against using excessive percocet due to fear of dependence, constipation etc Offered muscle relaxants but he declined due to grogginess with them    Other orders -     oxyCODONE-Acetaminophen; Take 1 tablet by mouth every 8 (eight) hours as needed for up to 5 days for severe pain (pain score 7-10).  Dispense: 15 tablet; Refill: 0     Meds ordered this encounter  Medications   oxyCODONE-acetaminophen (PERCOCET/ROXICET) 5-325 MG tablet    Sig: Take 1 tablet by mouth every 8 (eight) hours as needed for up to 5 days for severe pain (pain score 7-10).    Dispense:  15 tablet    Refill:  0    Acute prescription    No orders of the defined types were placed in this encounter.    Follow-up: Return if symptoms worsen or fail to improve.  Total time spent on today's visit was 53 minutes, including both face-to-face time and nonface-to-face time personally spent on review of chart (labs and imaging), discussing labs and goals, discussing further  work-up, treatment options, referrals to specialist if needed, reviewing outside records of pertinent, answering patient's questions, and coordinating care.   An After Visit Summary was printed and given to the patient.  Windell Moment, MD [facility] [phone]

## 2023-02-13 NOTE — Patient Instructions (Signed)
VISIT SUMMARY:  During today's visit, we discussed your chronic left lateral abdominal pain, which has been ongoing since 2016 and is likely due to scar tissue and adhesions from previous surgeries. We also reviewed your chronic constipation, prostate cancer post-treatment status, and sleep apnea management. Additionally, we talked about general health maintenance, including weight management and reducing sodium intake.  YOUR PLAN:  -CHRONIC LEFT LATERAL ABDOMINAL PAIN: Your chronic left lateral abdominal pain is likely due to scar tissue and adhesions from previous surgeries. We will order an MRI to investigate further and discuss the potential for trigger point injections if the MRI is inconclusive. You have been prescribed 15 tablets of Percocet for acute pain management, to be taken twice daily as needed.  -CONSTIPATION: Your chronic constipation is likely related to adhesions and scar tissue from previous surgeries. Please continue your current medication regimen for constipation as prescribed by your previous physician.  -PROSTATE CANCER (POST-TREATMENT): Your prostate cancer was treated with surgery and radiation, and your current PSA levels are well-managed with no signs of recurrence. We will continue to monitor your PSA levels.  -SLEEP APNEA: Your sleep apnea is contributing to an elevated red blood cell count and hematocrit. You are currently undergoing therapeutic phlebotomies and awaiting a referral to a sleep specialist for further management. Please continue with the therapeutic phlebotomies and follow up with the sleep specialist.  -GENERAL HEALTH MAINTENANCE: We discussed the importance of weight management and reducing sodium intake to improve your overall health and reduce pressure on your body. Please focus on weight loss and regular physical activity, and try to reduce your sodium intake.  INSTRUCTIONS:  Please follow up with Dr. Dutch Quint for the potential MRI and pain management.  Additionally, schedule a follow-up appointment with Dr. Sedalia Muta depending on the MRI results and pain management outcomes.

## 2023-02-14 ENCOUNTER — Other Ambulatory Visit: Payer: Self-pay

## 2023-02-14 ENCOUNTER — Other Ambulatory Visit: Payer: Self-pay | Admitting: Cardiovascular Disease

## 2023-02-14 DIAGNOSIS — I25110 Atherosclerotic heart disease of native coronary artery with unstable angina pectoris: Secondary | ICD-10-CM

## 2023-02-15 ENCOUNTER — Other Ambulatory Visit: Payer: Managed Care, Other (non HMO)

## 2023-02-16 ENCOUNTER — Other Ambulatory Visit: Payer: Self-pay | Admitting: Cardiovascular Disease

## 2023-02-22 ENCOUNTER — Inpatient Hospital Stay: Payer: Managed Care, Other (non HMO) | Attending: Hematology and Oncology

## 2023-02-22 ENCOUNTER — Other Ambulatory Visit: Payer: Self-pay

## 2023-02-22 ENCOUNTER — Inpatient Hospital Stay: Payer: Managed Care, Other (non HMO)

## 2023-02-22 VITALS — BP 133/72 | HR 52 | Temp 98.0°F | Resp 18

## 2023-02-22 DIAGNOSIS — Z79890 Hormone replacement therapy (postmenopausal): Secondary | ICD-10-CM | POA: Diagnosis not present

## 2023-02-22 DIAGNOSIS — Z923 Personal history of irradiation: Secondary | ICD-10-CM | POA: Diagnosis not present

## 2023-02-22 DIAGNOSIS — Z9079 Acquired absence of other genital organ(s): Secondary | ICD-10-CM | POA: Insufficient documentation

## 2023-02-22 DIAGNOSIS — Z8546 Personal history of malignant neoplasm of prostate: Secondary | ICD-10-CM | POA: Diagnosis not present

## 2023-02-22 DIAGNOSIS — D751 Secondary polycythemia: Secondary | ICD-10-CM | POA: Diagnosis present

## 2023-02-22 DIAGNOSIS — G4733 Obstructive sleep apnea (adult) (pediatric): Secondary | ICD-10-CM | POA: Diagnosis not present

## 2023-02-22 LAB — CBC WITH DIFFERENTIAL (CANCER CENTER ONLY)
Abs Immature Granulocytes: 0.03 10*3/uL (ref 0.00–0.07)
Basophils Absolute: 0.1 10*3/uL (ref 0.0–0.1)
Basophils Relative: 1 %
Eosinophils Absolute: 0.2 10*3/uL (ref 0.0–0.5)
Eosinophils Relative: 2 %
HCT: 52.5 % — ABNORMAL HIGH (ref 39.0–52.0)
Hemoglobin: 17.6 g/dL — ABNORMAL HIGH (ref 13.0–17.0)
Immature Granulocytes: 0 %
Lymphocytes Relative: 29 %
Lymphs Abs: 2 10*3/uL (ref 0.7–4.0)
MCH: 29.7 pg (ref 26.0–34.0)
MCHC: 33.5 g/dL (ref 30.0–36.0)
MCV: 88.5 fL (ref 80.0–100.0)
Monocytes Absolute: 0.6 10*3/uL (ref 0.1–1.0)
Monocytes Relative: 8 %
Neutro Abs: 4.1 10*3/uL (ref 1.7–7.7)
Neutrophils Relative %: 60 %
Platelet Count: 219 10*3/uL (ref 150–400)
RBC: 5.93 MIL/uL — ABNORMAL HIGH (ref 4.22–5.81)
RDW: 14.7 % (ref 11.5–15.5)
WBC Count: 6.9 10*3/uL (ref 4.0–10.5)
nRBC: 0 % (ref 0.0–0.2)

## 2023-02-22 NOTE — Patient Instructions (Signed)

## 2023-02-22 NOTE — Progress Notes (Signed)
Theodoro Clock presents today for phlebotomy per MD orders. Phlebotomy procedure started at 1602 w/ 16 G phelbotomy set and ended at 1608. 530 grams removed. Patient declined 30 minutes observation after procedure vss stable at dischargt. Patient tolerated procedure well. IV needle removed intact.

## 2023-02-23 LAB — FERRITIN: Ferritin: 29 ng/mL (ref 24–336)

## 2023-02-28 ENCOUNTER — Other Ambulatory Visit: Payer: Managed Care, Other (non HMO)

## 2023-03-07 ENCOUNTER — Inpatient Hospital Stay: Payer: Managed Care, Other (non HMO)

## 2023-03-07 VITALS — BP 132/77 | HR 58 | Temp 98.1°F | Resp 18 | Wt 225.8 lb

## 2023-03-07 DIAGNOSIS — D751 Secondary polycythemia: Secondary | ICD-10-CM

## 2023-03-07 LAB — CBC WITH DIFFERENTIAL (CANCER CENTER ONLY)
Abs Immature Granulocytes: 0.03 10*3/uL (ref 0.00–0.07)
Basophils Absolute: 0 10*3/uL (ref 0.0–0.1)
Basophils Relative: 0 %
Eosinophils Absolute: 0.3 10*3/uL (ref 0.0–0.5)
Eosinophils Relative: 3 %
HCT: 49.2 % (ref 39.0–52.0)
Hemoglobin: 16.4 g/dL (ref 13.0–17.0)
Immature Granulocytes: 0 %
Lymphocytes Relative: 19 %
Lymphs Abs: 1.8 10*3/uL (ref 0.7–4.0)
MCH: 28.9 pg (ref 26.0–34.0)
MCHC: 33.3 g/dL (ref 30.0–36.0)
MCV: 86.6 fL (ref 80.0–100.0)
Monocytes Absolute: 0.7 10*3/uL (ref 0.1–1.0)
Monocytes Relative: 7 %
Neutro Abs: 6.6 10*3/uL (ref 1.7–7.7)
Neutrophils Relative %: 71 %
Platelet Count: 219 10*3/uL (ref 150–400)
RBC: 5.68 MIL/uL (ref 4.22–5.81)
RDW: 14.1 % (ref 11.5–15.5)
WBC Count: 9.4 10*3/uL (ref 4.0–10.5)
nRBC: 0 % (ref 0.0–0.2)

## 2023-03-07 LAB — FERRITIN: Ferritin: 27 ng/mL (ref 24–336)

## 2023-03-07 NOTE — Progress Notes (Signed)
 Jacob Collier presents today for phlebotomy per MD orders. Phlebotomy procedure started at 1552 and ended at 1557. 550 grams removed. 16 ga phlebotomy kit used to the left [facility]. Patient declined post observation following procedure. Patient tolerated procedure well. VSS at discharge. IV needle removed intact.

## 2023-03-20 ENCOUNTER — Ambulatory Visit (INDEPENDENT_AMBULATORY_CARE_PROVIDER_SITE_OTHER): Payer: Managed Care, Other (non HMO) | Admitting: Physician Assistant

## 2023-03-20 ENCOUNTER — Encounter: Payer: Self-pay | Admitting: Physician Assistant

## 2023-03-20 ENCOUNTER — Telehealth: Payer: Self-pay | Admitting: Pharmacy Technician

## 2023-03-20 ENCOUNTER — Other Ambulatory Visit (HOSPITAL_COMMUNITY): Payer: Self-pay

## 2023-03-20 ENCOUNTER — Encounter: Payer: Self-pay | Admitting: Hematology and Oncology

## 2023-03-20 VITALS — BP 110/60 | HR 67 | Temp 98.3°F | Resp 16 | Ht 71.0 in | Wt 226.6 lb

## 2023-03-20 DIAGNOSIS — E785 Hyperlipidemia, unspecified: Secondary | ICD-10-CM | POA: Diagnosis not present

## 2023-03-20 DIAGNOSIS — R1012 Left upper quadrant pain: Secondary | ICD-10-CM

## 2023-03-20 DIAGNOSIS — M130 Polyarthritis, unspecified: Secondary | ICD-10-CM | POA: Diagnosis not present

## 2023-03-20 DIAGNOSIS — M5414 Radiculopathy, thoracic region: Secondary | ICD-10-CM | POA: Insufficient documentation

## 2023-03-20 DIAGNOSIS — G5601 Carpal tunnel syndrome, right upper limb: Secondary | ICD-10-CM | POA: Insufficient documentation

## 2023-03-20 MED ORDER — TRIAMCINOLONE ACETONIDE 40 MG/ML IJ SUSP
80.0000 mg | Freq: Once | INTRAMUSCULAR | Status: AC
  Administered 2023-03-20: 80 mg via INTRAMUSCULAR
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
## 2023-03-20 NOTE — Telephone Encounter (Signed)
 Pharmacy Patient Advocate Encounter   Received notification from Physician's Office that prior authorization for repatha  is required/requested.   Insurance verification completed.   The patient is insured through [place] .   Per test claim: PA required; PA submitted to above mentioned insurance via CoverMyMeds Key/confirmation #/EOC [id number] Status is pending

## 2023-03-20 NOTE — Telephone Encounter (Addendum)
 Pharmacy Patient Advocate Encounter   Received notification from Physician's Office that prior authorization for repatha  is required/requested.   Insurance verification completed.   The patient is insured through [place] .  TRANSITION FILL- pA SUBMITTED   Per test claim: The current 03/20/23 day co-pay is, $12.15 one month.  No PA needed at this time. Patient may be eligible for a Medicare prescription Payment plan. The patient will need to reach out to their insurance company to enrol in the payment plan to spread out their payments throughout the year, If available. This test claim was processed through [facility]- copay amounts may vary at other pharmacies due to pharmacy/plan contracts, or as the patient moves through the different stages of their insurance plan.
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
## 2023-03-20 NOTE — Assessment & Plan Note (Signed)
 Difficulty maintaining Repatha due to insurance coverage issues. -Reach out to pharmacist Fulton Reek to explore options for reinstating previous discount plan.

## 2023-03-21 ENCOUNTER — Other Ambulatory Visit: Payer: Managed Care, Other (non HMO)

## 2023-03-21 ENCOUNTER — Ambulatory Visit: Payer: Managed Care, Other (non HMO)

## 2023-03-21 ENCOUNTER — Ambulatory Visit: Payer: Managed Care, Other (non HMO) | Admitting: Hematology and Oncology

## 2023-03-23 ENCOUNTER — Encounter: Payer: Self-pay | Admitting: Physician Assistant

## 2023-03-28 ENCOUNTER — Inpatient Hospital Stay: Payer: Managed Care, Other (non HMO)

## 2023-03-28 ENCOUNTER — Inpatient Hospital Stay: Payer: Managed Care, Other (non HMO) | Attending: Hematology and Oncology

## 2023-03-28 ENCOUNTER — Inpatient Hospital Stay (HOSPITAL_BASED_OUTPATIENT_CLINIC_OR_DEPARTMENT_OTHER): Payer: Managed Care, Other (non HMO) | Admitting: Hematology and Oncology

## 2023-03-28 VITALS — BP 142/74 | HR 52 | Temp 98.0°F | Resp 16 | Wt 227.4 lb

## 2023-03-28 VITALS — BP 150/70 | HR 54 | Resp 14

## 2023-03-28 DIAGNOSIS — D751 Secondary polycythemia: Secondary | ICD-10-CM | POA: Insufficient documentation

## 2023-03-28 DIAGNOSIS — G4733 Obstructive sleep apnea (adult) (pediatric): Secondary | ICD-10-CM | POA: Diagnosis not present

## 2023-03-28 DIAGNOSIS — Z79890 Hormone replacement therapy (postmenopausal): Secondary | ICD-10-CM | POA: Insufficient documentation

## 2023-03-28 LAB — CBC WITH DIFFERENTIAL (CANCER CENTER ONLY)
Abs Immature Granulocytes: 0.05 10*3/uL (ref 0.00–0.07)
Basophils Absolute: 0.1 10*3/uL (ref 0.0–0.1)
Basophils Relative: 1 %
Eosinophils Absolute: 0.2 10*3/uL (ref 0.0–0.5)
Eosinophils Relative: 2 %
HCT: 53.1 % — ABNORMAL HIGH (ref 39.0–52.0)
Hemoglobin: 17 g/dL (ref 13.0–17.0)
Immature Granulocytes: 1 %
Lymphocytes Relative: 23 %
Lymphs Abs: 2.4 10*3/uL (ref 0.7–4.0)
MCH: 27.6 pg (ref 26.0–34.0)
MCHC: 32 g/dL (ref 30.0–36.0)
MCV: 86.3 fL (ref 80.0–100.0)
Monocytes Absolute: 0.7 10*3/uL (ref 0.1–1.0)
Monocytes Relative: 7 %
Neutro Abs: 6.9 10*3/uL (ref 1.7–7.7)
Neutrophils Relative %: 66 %
Platelet Count: 236 10*3/uL (ref 150–400)
RBC: 6.15 MIL/uL — ABNORMAL HIGH (ref 4.22–5.81)
RDW: 13.8 % (ref 11.5–15.5)
WBC Count: 10.3 10*3/uL (ref 4.0–10.5)
nRBC: 0 % (ref 0.0–0.2)

## 2023-03-28 LAB — FERRITIN: Ferritin: 18 ng/mL — ABNORMAL LOW (ref 24–336)

## 2023-03-28 NOTE — Progress Notes (Signed)
[facility] [phone]   [phone]  PROGRESS NOTE  Patient Care Team: Blane Ohara, MD as PCP - General (Family Medicine) Lennette Bihari, MD as PCP - Cardiology (Cardiology) Mealor, Roberts Gaudy, MD as PCP - Electrophysiology (Cardiology) Crista Elliot, MD as Consulting Physician (Urology) Smitty Knudsen as Physician Assistant (Cardiology) Smith Mince, MD as Referring Physician (Neurology)  Hematological/Oncological History # Polycythemia, secondary. Due to OSA/Exogenous Testosterone  06/11/2019: WBC 6.9, Hgb 15.5, MCV 91.4, Plt 219 04/01/2020: WBC 8.6, Hgb 18.4, MCV 87, Plt 219 08/03/2020: WBC 8.8, Hgb 19.3, MCV 88.7, Plt 187 09/09/2020: establish care with Dr. Leonides Schanz. Hgb 18.2  03/17/2021: WBC 9.3, Hgb 14.6, MCV 88.8, Plt 170   Interval History:  Jacob Collier 68 y.o. male with medical history significant for secondary polycythemia who presents for a follow up visit. The patient's last visit was on 01/13/2023. In the interim since the last visit he has undergone every 2 week phlebotomies.  On exam today [name] is accompanied by his wife.  He reports he feels like he breathes better after he receives the phlebotomies.  Overall he feels better.  He notes that he is tolerating phlebotomies well with no lightheadedness, dizziness, shortness of breath.  He continues his testosterone shots and is aware of the risks.  He reports he will be taking them "forever".  He notes that mentally he feels much better while receiving testosterone.  His mood also improved.  Unfortunately has not been able to meet with a sleep specialist since our last discussion.  He notes that he is not able to tolerate a nasal CPAP or standard CPAP.  He is open to other options including surgery or stimulator if offered.Jacob Collier  He currently denies any fevers, chills, sweats, nausea, vomiting or diarrhea.  A full 10 point ROS is listed below.  Overall he would like to continue  with phlebotomy today although we did discuss that this is controversial in the setting of secondary polycythemia.  Given the symptomatic relief he receives we can continue as he requested.  MEDICAL HISTORY:  Past Medical History:  Diagnosis Date   Anxiety    Arthritis    Coronary artery disease    a. S/p DES to RCA in 01/2019 b. s/p CABG x4 (LIMA-LAD, reverse SVG-PDA, reverse SVG-OM1, reverse SVG-1st Diag) in 01/2021   Diverticulosis    Dyspnea    Hard of hearing    Hyperlipidemia    Hypertension    hx of elevation on lyrica, off med and now normal    Paroxysmal SVT (supraventricular tachycardia) (HCC)    Post-op atrial fibrillation    Prostate cancer (HCC)    Wears glasses     SURGICAL HISTORY: Past Surgical History:  Procedure Laterality Date   ANTERIOR CERVICAL DECOMP/DISCECTOMY FUSION N/A 09/03/2021   Procedure: CERVICAL FIVE-SIX, CERVICAL SIX-SEVEN ANTERIOR CERVICAL DECOMPRESSION/DISCECTOMY FUSION;  Surgeon: Julio Sicks, MD;  Location: [facility] OR;  Service: Neurosurgery;  Laterality: N/A;   CARDIAC CATHETERIZATION     with 2 stents    COLECTOMY  05/2018   COLONOSCOPY     CORONARY ARTERY BYPASS GRAFT N/A 01/19/2021   Procedure: CORONARY ARTERY BYPASS GRAFTING (CABG), ON PUMP TIMES FOUR, USING LEFT INTERNAL MAMMARY ARTERY AND ENDOSOCPICALLY HARVESTED RIGHT GREATER SAPHENOUS VEIN;  Surgeon: Corliss Skains, MD;  Location: [facility] OR;  Service: Open Heart Surgery;  Laterality: N/A;   CYSTOSCOPY WITH URETHRAL DILATATION N/A 01/14/2021   Procedure: CYSTOSCOPY WITH BALLOON  DILATATION;  Surgeon: Alfredo Martinez, MD;  Location: [facility] ORS;  Service: Urology;  Laterality: N/A;   EYE SURGERY  02/2020   bilaterl cataracts   IR THORACENTESIS ASP PLEURAL SPACE W/IMG GUIDE  02/02/2021   LEFT HEART CATH AND CORONARY ANGIOGRAPHY N/A 12/24/2020   Procedure: LEFT HEART CATH AND CORONARY ANGIOGRAPHY;  Surgeon: Swazil[place], Peter M, MD;  Location: [facility] INVASIVE CV LAB;  Service: Cardiovascular;  Laterality:  N/A;   PROSTATECTOMY     SVT ABLATION N/A 10/20/2022   Procedure: SVT ABLATION;  Surgeon: Maurice Small, MD;  Location: [facility] INVASIVE CV LAB;  Service: Cardiovascular;  Laterality: N/A;   TEE WITHOUT CARDIOVERSION N/A 01/19/2021   Procedure: TRANSESOPHAGEAL ECHOCARDIOGRAM (TEE);  Surgeon: Corliss Skains, MD;  Location: [facility] OR;  Service: Open Heart Surgery;  Laterality: N/A;   TOTAL HIP ARTHROPLASTY Left 11/10/2015   Procedure: LEFT TOTAL HIP ARTHROPLASTY ANTERIOR APPROACH;  Surgeon: Kathryne Hitch, MD;  Location: [facility] OR;  Service: Orthopedics;  Laterality: Left;    SOCIAL HISTORY: Social History   Socioeconomic History   Marital status: Married    Spouse name: Not on file   Number of children: 0   Years of education: Not on file   Highest education level: Not on file  Occupational History   Occupation: Employed at [employer]  Tobacco Use   Smoking status: Never   Smokeless tobacco: Never  Vaping Use   Vaping status: Never Used  Substance and Sexual Activity   Alcohol use: Not Currently    Alcohol/week: 0.0 standard drinks of alcohol    Comment: rarely   Drug use: No   Sexual activity: Yes    Partners: Female    Birth control/protection: None  Other Topics Concern   Not on file  Social History Narrative   Not on file   Social Drivers of Health   Financial Resource Strain: Low Risk  (08/04/2022)   Overall Financial Resource Strain (CARDIA)    Difficulty of Paying Living Expenses: Not hard at all  Food Insecurity: No Food Insecurity (10/29/2022)   Hunger Vital Sign    Worried About Running Out of Food in the Last Year: Never true    Ran Out of Food in the Last Year: Never true  Transportation Needs: No Transportation Needs (10/29/2022)   PRAPARE - Administrator, Civil Service (Medical): No    Lack of Transportation (Non-Medical): No  Physical Activity: Sufficiently Active (08/04/2022)   Exercise Vital Sign    Days of Exercise per Week: 5 days     Minutes of Exercise per Session: 60 min  Stress: No Stress Concern Present (08/04/2022)   [employer] of Occupational Health - Occupational Stress Questionnaire    Feeling of Stress : Not at all  Social Connections: Moderately Isolated (08/04/2022)   Social Connection and Isolation Panel [NHANES]    Frequency of Communication with Friends and Family: More than three times a week    Frequency of Social Gatherings with Friends and Family: More than three times a week    Attends Religious Services: Never    Database administrator or Organizations: No    Attends Banker Meetings: Never    Marital Status: Married  Catering manager Violence: Not At Risk (10/29/2022)   Humiliation, Afraid, Rape, and Kick questionnaire    Fear of Current or Ex-Partner: No    Emotionally Abused: No    Physically Abused: No    Sexually Abused: No    FAMILY  HISTORY: Family History  Problem Relation Age of Onset   Alzheimer's disease Mother    Stroke Father    Diabetes Sister    Heart attack Maternal Grandmother    Cancer Maternal Uncle        prostate   Prostate cancer Maternal Uncle    Cancer Maternal Uncle        prostate   Prostate cancer Maternal Uncle    Colon polyps Brother    Colon cancer Neg Hx    Esophageal cancer Neg Hx    Rectal cancer Neg Hx    Stomach cancer Neg Hx     ALLERGIES:  is allergic to ranolazine, amoxicillin, atorvastatin, pravastatin, rosuvastatin, statins, eliquis [apixaban], and xarelto [rivaroxaban].  MEDICATIONS:  Current Outpatient Medications  Medication Sig Dispense Refill   amLODipine (NORVASC) 5 MG tablet TAKE 1 TABLET BY MOUTH EVERYDAY AT BEDTIME 90 tablet 3   aspirin EC 81 MG tablet Take 81 mg by mouth daily. Swallow whole.     clonazePAM (KLONOPIN) 1 MG tablet TAKE 1 TABLET BY MOUTH TWICE A DAY AS NEEDED FOR ANXIETY 60 tablet 3   clopidogrel (PLAVIX) 75 MG tablet Take 1 tablet (75 mg total) by mouth daily. 90 tablet 3   Eszopiclone 3 MG  TABS TAKE 1 TABLET BY MOUTH EVERYDAY AT BEDTIME 30 tablet 5   Evolocumab (REPATHA SURECLICK) 140 MG/ML SOAJ INJECT 140 MG INTO THE SKIN EVERY 14 (FOURTEEN) DAYS. 6 mL 3   ezetimibe (ZETIA) 10 MG tablet TAKE 1 TABLET BY MOUTH EVERY DAY 90 tablet 3   metoprolol succinate (TOPROL-XL) 12.5 mg TB24 24 hr tablet Take 12.5 mg by mouth daily.     Omega-3 Fatty Acids (OMEGA 3 PO) Take 500 mg by mouth every morning.     Phenylephrine HCl (AFRIN ALLERGY NA) Place 2 sprays into the nose 2 (two) times daily.     No current facility-administered medications for this visit.    REVIEW OF SYSTEMS:   Constitutional: ( - ) fevers, ( - )  chills , ( - ) night sweats Eyes: ( - ) blurriness of vision, ( - ) double vision, ( - ) watery eyes Ears, nose, mouth, throat, and face: ( - ) mucositis, ( - ) sore throat Respiratory: ( - ) cough, ( - ) dyspnea, ( - ) wheezes Cardiovascular: ( - ) palpitation, ( - ) chest discomfort, ( - ) lower extremity swelling Gastrointestinal:  ( - ) nausea, ( - ) heartburn, ( - ) change in bowel habits Skin: ( - ) abnormal skin rashes Lymphatics: ( - ) new lymphadenopathy, ( - ) easy bruising Neurological: ( - ) numbness, ( - ) tingling, ( - ) new weaknesses Behavioral/Psych: ( - ) mood change, ( - ) new changes  All other systems were reviewed with the patient and are negative.  PHYSICAL EXAMINATION:  Vitals:   03/28/23 0946  BP: (!) 142/74  Pulse: (!) 52  Resp: 16  Temp: 98 F (36.7 C)  SpO2: 98%    Filed Weights   03/28/23 0946  Weight: 227 lb 6.4 oz (103.1 kg)     GENERAL: Well-appearing elderly Caucasian male, alert, no distress and comfortable SKIN: skin color, texture, turgor are normal, no rashes or significant lesions EYES: conjunctiva are pink and non-injected, sclera clear LUNGS: clear to auscultation and percussion with normal breathing effort HEART: regular rate & rhythm and no murmurs and no lower extremity edema Musculoskeletal: no cyanosis of digits  and no  clubbing  PSYCH: alert & oriented x 3, fluent speech NEURO: no focal motor/sensory deficits  LABORATORY DATA:  I have reviewed the data as listed    Latest Ref Rng & Units 03/28/2023    8:36 AM 03/07/2023    3:18 PM 02/22/2023    3:08 PM  CBC  WBC 4.0 - 10.5 K/uL 10.3  9.4  6.9   Hemoglobin 13.0 - 17.0 g/dL 13.0  86.5  78.4   Hematocrit 39.0 - 52.0 % 53.1  49.2  52.5   Platelets 150 - 400 K/uL 236  219  219        Latest Ref Rng & Units 01/13/2023    2:55 PM 12/04/2022    5:50 AM 12/04/2022    5:11 AM  CMP  Glucose 70 - 99 mg/dL 87  696  295   BUN 8 - 23 mg/dL 15  11  14    Creatinine 0.61 - 1.24 mg/dL 2.84  1.32  4.40   Sodium 135 - 145 mmol/L 138  138  139   Potassium 3.5 - 5.1 mmol/L 3.9  4.0  5.4   Chloride 98 - 111 mmol/L 108  108  111   CO2 22 - 32 mmol/L 23  20    Calcium 8.9 - 10.3 mg/dL 9.1  8.5    Total Protein 6.5 - 8.1 g/dL 7.0  6.0    Total Bilirubin <1.2 mg/dL 0.8  0.3    Alkaline Phos 38 - 126 U/L 77  58    AST 15 - 41 U/L 23  22    ALT 0 - 44 U/L 20  29      RADIOGRAPHIC STUDIES: No results found.  ASSESSMENT & PLAN ORLANDA MANUEL 68 y.o. male with medical history significant for secondary polycythemia who presents for a follow up visit.   After review of the labs, review of the records, and discussion with the patient the patients findings are most consistent with a secondary polycythemia.  The patient has 2 possible sources including obstructive sleep apnea or exogenous testosterone use.  Polysomnography was ordered but not performed in the interim since her last visit.  He also had a CABG procedure after which time his apnea symptoms resolved, possibly due to marked weight loss.  At this time his hemoglobin has normalized and the patient has restarted his testosterone shots.  I have once again expressed to him my recommendation the testosterone shots are not advisable in the setting of polycythemia and his prior history of prostate cancer.  He notes  that he will consider polysomnography testing if his hemoglobin were to increase at the next visit.  # Polycythemia,  Secondary to OSA and Exogenous Testosterone -- Findings at this time are most consistent with a secondary polycythemia due to either obstructive sleep apnea or exogenous testosterone injections. --Hgb today normalized at 14.6, having dropped from after his CABG surgery in Nov 2022.  -- Patient has a diagnosis of OSA but is having difficulty with the CPAP machine.  He notes he is following with a primary care provider and not a sleep medicine specialist at this time --Patient notes he is not able to tolerate CPAP or nasal CPAP and is open other options. --negative JAK2 with reflex and BCR/ABL FISH panels at last visit.  --No indication for a bone marrow biopsy at this time. --Labs today show white blood cell 10.3, hemoglobin 17.0, MCV 86.3, platelets 236 --We discussed how it is controversial to perform phlebotomies outside the  setting of polycythemia vera.  The patient reports that he feels much better and would be willing to talk with his sleep medicine doctor, in the interim we will perform phlebotomies as a temporizing measure to see if this improves some of his symptoms. -- Return to clinic in 8 weeks time with interval every 2 week phlebotomies   #Prostate Cancer --s/p prostatectomy in 2015, biochemical recurrence treated in 2018 with radiation therapy --currently follows with [facility] -- Discussed with patient that testosterone shots in the setting of history of prostate cancer is not advisable. -- Defer management to urology, but patient wanted [place] to continue to monitoring for this disease would be happy to oblige.  No orders of the defined types were placed in this encounter.   All questions were answered. The patient knows to call the clinic with any problems, questions or concerns.  A total of more than 30 minutes were spent on this encounter with  face-to-face time and non-face-to-face time, including preparing to see the patient, ordering tests and/or medications, counseling the patient and coordination of care as outlined above.   Ulysees Barns, MD Department of Hematology/Oncology [facility] Phone: [phone] Pager: [phone] Email: [email or web address]  03/28/2023 1:59 PM

## 2023-03-28 NOTE — Progress Notes (Signed)
Jacob Collier presents today for phlebotomy per MD orders. Phlebotomy procedure started at 1044 and ended at 1103. 502 grams removed. 18G IV used to L AC.  Patient declined observation.  Patient tolerated procedure well. IV needle removed intact.

## 2023-04-01 ENCOUNTER — Other Ambulatory Visit: Payer: Self-pay | Admitting: Physician Assistant

## 2023-04-01 DIAGNOSIS — R1013 Epigastric pain: Secondary | ICD-10-CM

## 2023-04-11 ENCOUNTER — Telehealth: Payer: Self-pay

## 2023-04-11 NOTE — Telephone Encounter (Signed)
 Left message informing patient that his repatha  was approved that he should have received an approval letter from [facility]. Also informed him that since his deductible has been it should be cheaper per [facility] message sent via mychart.  Copied from CRM [id number]. Topic: Clinical - Prescription Issue >> Apr 11, 2023  2:57 PM Valeri Gate H wrote: Reason for CRM: Patient's wife called to follow up on the status of prior authorization for repatha .

## 2023-04-12 ENCOUNTER — Inpatient Hospital Stay: Payer: Managed Care, Other (non HMO)

## 2023-04-12 ENCOUNTER — Inpatient Hospital Stay: Payer: Managed Care, Other (non HMO) | Attending: Hematology and Oncology

## 2023-04-12 VITALS — BP 129/73 | HR 64 | Temp 98.0°F | Resp 17

## 2023-04-12 DIAGNOSIS — Z79890 Hormone replacement therapy (postmenopausal): Secondary | ICD-10-CM | POA: Insufficient documentation

## 2023-04-12 DIAGNOSIS — D751 Secondary polycythemia: Secondary | ICD-10-CM | POA: Insufficient documentation

## 2023-04-12 DIAGNOSIS — Z9889 Other specified postprocedural states: Secondary | ICD-10-CM | POA: Insufficient documentation

## 2023-04-12 DIAGNOSIS — G4733 Obstructive sleep apnea (adult) (pediatric): Secondary | ICD-10-CM | POA: Diagnosis not present

## 2023-04-12 LAB — CBC WITH DIFFERENTIAL (CANCER CENTER ONLY)
Abs Immature Granulocytes: 0.02 10*3/uL (ref 0.00–0.07)
Basophils Absolute: 0.1 10*3/uL (ref 0.0–0.1)
Basophils Relative: 1 %
Eosinophils Absolute: 0.1 10*3/uL (ref 0.0–0.5)
Eosinophils Relative: 2 %
HCT: 48 % (ref 39.0–52.0)
Hemoglobin: 15.9 g/dL (ref 13.0–17.0)
Immature Granulocytes: 0 %
Lymphocytes Relative: 21 %
Lymphs Abs: 1.5 10*3/uL (ref 0.7–4.0)
MCH: 27.7 pg (ref 26.0–34.0)
MCHC: 33.1 g/dL (ref 30.0–36.0)
MCV: 83.8 fL (ref 80.0–100.0)
Monocytes Absolute: 0.6 10*3/uL (ref 0.1–1.0)
Monocytes Relative: 8 %
Neutro Abs: 4.9 10*3/uL (ref 1.7–7.7)
Neutrophils Relative %: 68 %
Platelet Count: 225 10*3/uL (ref 150–400)
RBC: 5.73 MIL/uL (ref 4.22–5.81)
RDW: 14 % (ref 11.5–15.5)
WBC Count: 7.2 10*3/uL (ref 4.0–10.5)
nRBC: 0 % (ref 0.0–0.2)

## 2023-04-12 NOTE — Progress Notes (Signed)
 Jacob Collier presents today for phlebotomy per MD orders. Phlebotomy procedure started at 1608 and ended at 1613. 543 grams removed with phlebotomy kit.  Patient declined observed for 30 minutes after procedure without any incident, VSS. Patient tolerated procedure well. IV needle removed intact.

## 2023-04-13 ENCOUNTER — Telehealth: Payer: Self-pay | Admitting: *Deleted

## 2023-04-13 LAB — FERRITIN: Ferritin: 15 ng/mL — ABNORMAL LOW (ref 24–336)

## 2023-04-13 NOTE — Telephone Encounter (Signed)
   Pre-operative Risk Assessment    Patient Name: Jacob Collier  DOB: 1955/12/13 MRN: [id number]   Date of last office visit: 12/15/22 DR. KELLY Date of next office visit: NONE   Request for Surgical Clearance    Procedure:  RIGHT CARPAL TUNNEL RELEASE  Date of Surgery:  Clearance 04/27/23                                Surgeon:  DR. Agustina Aldrich  Surgeon's Group or Practice Name:  [facility] Phone number:  [phone] Fax number:  [phone] ATTN: Sherian Dimitri EXT [id number]   Type of Clearance Requested:   - Medical  - Pharmacy:  Hold Aspirin  and Clopidogrel  (Plavix )     Type of Anesthesia:  Local & MAC   Additional requests/questions:    [place] Broom   04/13/2023, 6:03 PM

## 2023-04-14 NOTE — Telephone Encounter (Signed)
   Name: Jacob Collier  DOB: 10-05-1955  MRN: [id number]  Primary Cardiologist: Magnus Schuller, MD   Preoperative team, please contact this patient and set up a phone call appointment for further preoperative risk assessment. Please obtain consent and complete medication review. Thank you for your help.  I confirm that guidance regarding antiplatelet and oral anticoagulation therapy has been completed and, if necessary, noted below.  His Plavix  may be held for 5 days prior to his procedure.  If necessary his aspirin  may be held for 5 to 7 days prior to his procedure.  Please resume as soon as hemostasis is achieved.  I also confirmed the patient resides in the state of [place] . As per [facility] Medical Board telemedicine laws, the patient must reside in the state in which the provider is licensed.   Carie Charity, NP 04/14/2023, 2:36 PM [facility]

## 2023-04-14 NOTE — Telephone Encounter (Signed)
 Pt has been scheduled to see Neomi Banks, NP at the [facility] location 04/20/23 @ 2:45. Pt asked if I could send him a my chart message with the information as he is ar work right now.

## 2023-04-17 ENCOUNTER — Encounter: Payer: Self-pay | Admitting: Family Medicine

## 2023-04-17 ENCOUNTER — Ambulatory Visit (INDEPENDENT_AMBULATORY_CARE_PROVIDER_SITE_OTHER): Payer: Managed Care, Other (non HMO) | Admitting: Family Medicine

## 2023-04-17 ENCOUNTER — Other Ambulatory Visit (HOSPITAL_COMMUNITY): Payer: Self-pay

## 2023-04-17 ENCOUNTER — Ambulatory Visit: Payer: Self-pay | Admitting: Family Medicine

## 2023-04-17 ENCOUNTER — Telehealth: Payer: Self-pay | Admitting: Cardiovascular Disease

## 2023-04-17 ENCOUNTER — Telehealth: Payer: Self-pay | Admitting: Pharmacy Technician

## 2023-04-17 VITALS — BP 132/78 | HR 72 | Temp 97.1°F | Ht 71.0 in | Wt 220.0 lb

## 2023-04-17 DIAGNOSIS — H66002 Acute suppurative otitis media without spontaneous rupture of ear drum, left ear: Secondary | ICD-10-CM | POA: Diagnosis not present

## 2023-04-17 DIAGNOSIS — M545 Low back pain, unspecified: Secondary | ICD-10-CM | POA: Insufficient documentation

## 2023-04-17 LAB — POCT URINALYSIS DIP (CLINITEK)
Blood, UA: NEGATIVE
Glucose, UA: NEGATIVE mg/dL
Leukocytes, UA: NEGATIVE
Nitrite, UA: NEGATIVE
Spec Grav, UA: >=1.03 — AB (ref 1.010–1.025)
Urobilinogen, UA: NEGATIVE U/dL — AB
pH, UA: 6 (ref 5.0–8.0)

## 2023-04-17 MED ORDER — DOXYCYCLINE HYCLATE 100 MG PO TABS: 100.0000 mg | ORAL_TABLET | Freq: Two times a day (BID) | ORAL | 0 refills | Status: AC

## 2023-04-17 NOTE — Telephone Encounter (Signed)
Follow Up:    She is calling to check on the status of patient's prior authorization for Repatha.

## 2023-04-17 NOTE — Telephone Encounter (Signed)
 Chief Complaint: L flank pain Symptoms: urinary pain, flank pain, incontinence, fever Frequency: Saturday Pertinent Negatives: Patient denies SOB, current chest pain Disposition: [] ED /[] Urgent Care (no appt availability in office) / [x] Appointment(In office/virtual)/ []  [facility]/ [] Home Care/ [] Refused Recommended Disposition /[] [place] [facility]/ []  Follow-up with PCP Additional Notes: Patient c/o L flank pain, reporting "L kidney flaring up" and in need of abx. Reports he has previous hx of episode that requires abx for treatment. Denies any SOB, current chest pain. Endorses fever, and urinary pain Scheduled patient per protocol on 04/17/2023 via video as pt did not wish for in-person visit.    Copied from CRM [id number]. Topic: Clinical - Red Word Triage >> Apr 17, 2023 12:39 PM Brynn Caras wrote: Red Word that prompted transfer to Nurse Triage: left kidney is acting up - fever, pain in the abodmen, wanting antibiotics called in. Reason for Disposition  Side (flank) or lower back pain present  Answer Assessment - Initial Assessment Questions 1. LOCATION: "Where does it hurt?"      L kidney pain/flare 2. RADIATION: "Does the pain shoot anywhere else?" (e.g., chest, back)     "Mild angina with it yesterday, I've also had intestinal trouble too.. It's gone away" L abdominal tenderness 3. ONSET: "When did the pain begin?" (Minutes, hours or days ago)      Saturday 4. SUDDEN: "Gradual or sudden onset?"     gradual 5. PATTERN "Does the pain come and go, or is it constant?"    - If it comes and goes: "How long does it last?" "Do you have pain now?"     (Note: Comes and goes means the pain is intermittent. It goes away completely between bouts.)    - If constant: "Is it getting better, staying the same, or getting worse?"      (Note: Constant means the pain never goes away completely; most serious pain is constant and gets worse.)      constant 6. SEVERITY: "How bad is the  pain?"  (e.g., Scale 1-10; mild, moderate, or severe)    - MILD (1-3): Doesn't interfere with normal activities, abdomen soft and not tender to touch.     - MODERATE (4-7): Interferes with normal activities or awakens from sleep, abdomen tender to touch.     - SEVERE (8-10): Excruciating pain, doubled over, unable to do any normal activities.       moderate 7. RECURRENT SYMPTOM: "Have you ever had this type of stomach pain before?" If Yes, ask: "When was the last time?" and "What happened that time?"      Yes, but cannot recall when 8. CAUSE: "What do you think is causing the stomach pain?"     Hx incontinence, cancer 9. RELIEVING/AGGRAVATING FACTORS: "What makes it better or worse?" (e.g., antacids, bending or twisting motion, bowel movement)     Reports able to keep down fluids and increased hydration 10. OTHER SYMPTOMS: "Do you have any other symptoms?" (e.g., back pain, diarrhea, fever, urination pain, vomiting)       Fever Urinary pain Some back pain nausea  Answer Assessment - Initial Assessment Questions 1. SYMPTOM: "What's the main symptom you're concerned about?" (e.g., frequency, incontinence)     Urinary pain, incontinence 2. ONSET: "When did the  pain  start?"     Saturday 3. PAIN: "Is there any pain?" If Yes, ask: "How bad is it?" (Scale: 1-10; mild, moderate, severe)     L flank/kidney pain 4. CAUSE: "What do  you think is causing the symptoms?"     infection 5. OTHER SYMPTOMS: "Do you have any other symptoms?" (e.g., blood in urine, fever, flank pain, pain with urination)     Flank pain, fever, pain with urination  Protocols used: Abdominal Pain - Male-A-AH, Urinary Symptoms-A-AH

## 2023-04-17 NOTE — Telephone Encounter (Signed)
 [facility] saying med needs pa but pt [facility] works just fine. I gave pharmacy [facility] info and they also got a paid claim

## 2023-04-17 NOTE — Assessment & Plan Note (Signed)
 Pain localized to left kidney area, not associated with typical urinary tract infection symptoms. No history of kidney stones. - UA - negative - encouraged to drink more water and follow-up with urologist -Consider imaging if pain does not improve to rule out kidney stones.

## 2023-04-17 NOTE — Progress Notes (Signed)
 Acute Office Visit  Subjective:    Patient ID: Jacob Collier, male    DOB: August 23, 1955, 68 y.o.   MRN: [id number]  Chief Complaint  Patient presents with   Left lower back pain-kidney pain    Discussed the use of AI scribe software for clinical note transcription with the patient, who gave verbal consent to proceed.  HPI: Jacob Collier "Wandalee Gust" is a 68 year old male with a history of prostate cancer and incontinence who presents with left-sided back pain and concerns of a kidney infection.  He experiences left-sided back pain, described as feeling like being 'stomped in my kidney.' The pain is not associated with typical urinary tract infection symptoms such as burning, cloudy urine, or foul smell. His urine is dark, which he attributes to dehydration and insufficient fluid intake. He has had similar episodes in the past, typically resolved with antibiotics, with the last treatment for a kidney infection in 2021.  He is incontinent and notes that when he is not active, his urine tends to stay inside him, exacerbating his symptoms. He has a history of prostate cancer and has undergone a prostatectomy. He is currently under the care of a urologist, whom he sees every six months for PSA tests.  He is on several medications, including Repatha , Plavix , Klonopin , Norvasc , and aspirin . He has allergies to most statins, Xarelto , Eliquis , and possibly amoxicillin , which once caused hives. He undergoes phlebotomy every two weeks due to elevated red blood cell counts.  No chest pain, shortness of breath, vomiting, diarrhea, or swelling in feet. He reports nausea without vomiting and has not experienced any recent infections or been around anyone sick. He is completely deaf with tinnitus and does not perceive fullness in his ears.  Past Medical History:  Diagnosis Date   Anxiety    Arthritis    Coronary artery disease    a. S/p DES to RCA in 01/2019 b. s/p CABG x4 (LIMA-LAD, reverse SVG-PDA,  reverse SVG-OM1, reverse SVG-1st Diag) in 01/2021   Diverticulosis    Dyspnea    Hard of hearing    Hyperlipidemia    Hypertension    hx of elevation on lyrica , off med and now normal    Paroxysmal SVT (supraventricular tachycardia) (HCC)    Post-op atrial fibrillation    Prostate cancer (HCC)    Wears glasses     Past Surgical History:  Procedure Laterality Date   ANTERIOR CERVICAL DECOMP/DISCECTOMY FUSION N/A 09/03/2021   Procedure: CERVICAL FIVE-SIX, CERVICAL SIX-SEVEN ANTERIOR CERVICAL DECOMPRESSION/DISCECTOMY FUSION;  Surgeon: Agustina Aldrich, MD;  Location: [facility] OR;  Service: Neurosurgery;  Laterality: N/A;   CARDIAC CATHETERIZATION     with 2 stents    COLECTOMY  05/2018   COLONOSCOPY     CORONARY ARTERY BYPASS GRAFT N/A 01/19/2021   Procedure: CORONARY ARTERY BYPASS GRAFTING (CABG), ON PUMP TIMES FOUR, USING LEFT INTERNAL MAMMARY ARTERY AND ENDOSOCPICALLY HARVESTED RIGHT GREATER SAPHENOUS VEIN;  Surgeon: Hilarie Lovely, MD;  Location: [facility] OR;  Service: Open Heart Surgery;  Laterality: N/A;   CYSTOSCOPY WITH URETHRAL DILATATION N/A 01/14/2021   Procedure: CYSTOSCOPY WITH BALLOON  DILATATION;  Surgeon: Erman Hayward, MD;  Location: [facility];  Service: Urology;  Laterality: N/A;   EYE SURGERY  02/2020   bilaterl cataracts   IR THORACENTESIS ASP PLEURAL SPACE W/IMG GUIDE  02/02/2021   LEFT HEART CATH AND CORONARY ANGIOGRAPHY N/A 12/24/2020   Procedure: LEFT HEART CATH AND CORONARY ANGIOGRAPHY;  Surgeon: Swazil[place], Peter M, MD;  Location: [facility];  Service: Cardiovascular;  Laterality: N/A;   PROSTATECTOMY     SVT ABLATION N/A 10/20/2022   Procedure: SVT ABLATION;  Surgeon: Efraim Grange, MD;  Location: [facility];  Service: Cardiovascular;  Laterality: N/A;   TEE WITHOUT CARDIOVERSION N/A 01/19/2021   Procedure: TRANSESOPHAGEAL ECHOCARDIOGRAM (TEE);  Surgeon: Hilarie Lovely, MD;  Location: [facility] OR;  Service: Open Heart Surgery;  Laterality: N/A;   TOTAL  HIP ARTHROPLASTY Left 11/10/2015   Procedure: LEFT TOTAL HIP ARTHROPLASTY ANTERIOR APPROACH;  Surgeon: Arnie Lao, MD;  Location: [facility] OR;  Service: Orthopedics;  Laterality: Left;    Family History  Problem Relation Age of Onset   Alzheimer's disease Mother    Stroke Father    Diabetes Sister    Heart attack Maternal Grandmother    Cancer Maternal Uncle        prostate   Prostate cancer Maternal Uncle    Cancer Maternal Uncle        prostate   Prostate cancer Maternal Uncle    Colon polyps Brother    Colon cancer Neg Hx    Esophageal cancer Neg Hx    Rectal cancer Neg Hx    Stomach cancer Neg Hx     Social History   Socioeconomic History   Marital status: Married    Spouse name: Not on file   Number of children: 0   Years of education: Not on file   Highest education level: Not on file  Occupational History   Occupation: Employed at [employer]  Tobacco Use   Smoking status: Never   Smokeless tobacco: Never  Vaping Use   Vaping status: Never Used  Substance and Sexual Activity   Alcohol use: Not Currently    Alcohol/week: 0.0 standard drinks of alcohol    Comment: rarely   Drug use: No   Sexual activity: Yes    Partners: Female    Birth control/protection: None  Other Topics Concern   Not on file  Social History Narrative   Not on file   Social Drivers of Health   Financial Resource Strain: Low Risk  (08/04/2022)   Overall Financial Resource Strain (CARDIA)    Difficulty of Paying Living Expenses: Not hard at all  Food Insecurity: No Food Insecurity (10/29/2022)   Hunger Vital Sign    Worried About Running Out of Food in the Last Year: Never true    Ran Out of Food in the Last Year: Never true  Transportation Needs: No Transportation Needs (10/29/2022)   PRAPARE - Administrator, Civil Service (Medical): No    Lack of Transportation (Non-Medical): No  Physical Activity: Sufficiently Active (08/04/2022)   Exercise Vital Sign    Days  of Exercise per Week: 5 days    Minutes of Exercise per Session: 60 min  Stress: No Stress Concern Present (08/04/2022)   [employer] of Occupational Health - Occupational Stress Questionnaire    Feeling of Stress : Not at all  Social Connections: Moderately Isolated (08/04/2022)   Social Connection and Isolation Panel [NHANES]    Frequency of Communication with Friends and Family: More than three times a week    Frequency of Social Gatherings with Friends and Family: More than three times a week    Attends Religious Services: Never    Database administrator or Organizations: No    Attends Banker Meetings: Never    Marital Status: Married  Intimate  Partner Violence: Not At Risk (10/29/2022)   Humiliation, Afraid, Rape, and Kick questionnaire    Fear of Current or Ex-Partner: No    Emotionally Abused: No    Physically Abused: No    Sexually Abused: No    Outpatient Medications Prior to Visit  Medication Sig Dispense Refill   amLODipine  (NORVASC ) 5 MG tablet TAKE 1 TABLET BY MOUTH EVERYDAY AT BEDTIME 90 tablet 3   aspirin  EC 81 MG tablet Take 81 mg by mouth daily. Swallow whole.     clonazePAM  (KLONOPIN ) 1 MG tablet TAKE 1 TABLET BY MOUTH TWICE A DAY AS NEEDED FOR ANXIETY 60 tablet 3   clopidogrel  (PLAVIX ) 75 MG tablet Take 1 tablet (75 mg total) by mouth daily. 90 tablet 3   Eszopiclone  3 MG TABS TAKE 1 TABLET BY MOUTH EVERYDAY AT BEDTIME 30 tablet 5   Evolocumab  (REPATHA  SURECLICK) 140 MG/ML SOAJ INJECT 140 MG INTO THE SKIN EVERY 14 (FOURTEEN) DAYS. 6 mL 3   ezetimibe  (ZETIA ) 10 MG tablet TAKE 1 TABLET BY MOUTH EVERY DAY 90 tablet 3   metoprolol  succinate (TOPROL -XL) 12.5 mg TB24 24 hr tablet Take 12.5 mg by mouth daily.     Omega-3 Fatty Acids (OMEGA 3 PO) Take 500 mg by mouth every morning.     Phenylephrine  HCl (AFRIN ALLERGY NA) Place 2 sprays into the nose 2 (two) times daily.     No facility-administered medications prior to visit.    Allergies  Allergen  Reactions   Ranolazine Other (See Comments)    Chest discomfort/Reflux   Amoxicillin  Hives   Atorvastatin  Other (See Comments)    myalgias  Other reaction(s): Myalgias (intolerance)   Pravastatin  Other (See Comments)    myalgias  Other reaction(s): Myalgias (intolerance)   Rosuvastatin  Other (See Comments)    myalgias  Other reaction(s): Myalgias (intolerance)   Statins Other (See Comments)    myalgias   Eliquis  [Apixaban ] Rash and Other (See Comments)    Joint discomfort and elevated BP   Xarelto  [Rivaroxaban ] Rash    Joint discomfort, elevated BP    Review of Systems  Constitutional:  Positive for fatigue and fever (99.4). Negative for appetite change.  HENT:  Positive for tinnitus. Negative for congestion, ear pain, sinus pressure and sore throat.   Respiratory:  Negative for cough, chest tightness, shortness of breath and wheezing.   Cardiovascular:  Negative for chest pain and palpitations.  Gastrointestinal:  Positive for nausea. Negative for abdominal pain, constipation, diarrhea and vomiting.  Genitourinary:  Positive for difficulty urinating. Negative for dysuria and hematuria.       Incontinence   Musculoskeletal:  Positive for back pain (Low left back pain/kidney pain). Negative for arthralgias, joint swelling and myalgias.  Skin:  Negative for rash.  Neurological:  Negative for dizziness, weakness and headaches.  Psychiatric/Behavioral:  Negative for dysphoric mood. The patient is not nervous/anxious.        Objective:        04/17/2023    3:13 PM 04/12/2023    3:53 PM 03/28/2023   11:06 AM  Vitals with BMI  Height 5\' 11"     Weight 220 lbs    BMI 30.7    Systolic 132 129 657  Diastolic 78 73 70  Pulse 72 64 54    Orthostatic VS for the past 72 hrs (Last 3 readings):  Patient Position BP Location  04/17/23 1513 Sitting Left Arm     Physical Exam Constitutional:      General: He  is not in acute distress.    Appearance: Normal appearance. He is  ill-appearing.  HENT:     Right Ear: A middle ear effusion is present.     Left Ear: A middle ear effusion is present. Tympanic membrane is erythematous.  Eyes:     Conjunctiva/sclera: Conjunctivae normal.  Cardiovascular:     Rate and Rhythm: Normal rate and regular rhythm.     Heart sounds: Normal heart sounds. No murmur heard. Pulmonary:     Effort: Pulmonary effort is normal.     Breath sounds: Normal breath sounds. No wheezing.  Abdominal:     General: Bowel sounds are normal.     Palpations: Abdomen is soft.     Tenderness: There is no abdominal tenderness.  Musculoskeletal:        General: Normal range of motion.  Skin:    General: Skin is warm.  Neurological:     Mental Status: He is alert. Mental status is at baseline.  Psychiatric:        Mood and Affect: Mood normal.        Behavior: Behavior normal.     Health Maintenance Due  Topic Date Due   Medicare Annual Wellness (AWV)  Never done   DTaP/Tdap/Td (1 - Tdap) Never done   Zoster Vaccines- Shingrix (1 of 2) Never done   INFLUENZA VACCINE  10/06/2022    There are no preventive care reminders to display for this patient.   Lab Results  Component Value Date   TSH 1.570 04/04/2022   Lab Results  Component Value Date   WBC 7.2 04/12/2023   HGB 15.9 04/12/2023   HCT 48.0 04/12/2023   MCV 83.8 04/12/2023   PLT 225 04/12/2023   Lab Results  Component Value Date   NA 138 01/13/2023   K 3.9 01/13/2023   CO2 23 01/13/2023   GLUCOSE 87 01/13/2023   BUN 15 01/13/2023   CREATININE 1.19 01/13/2023   BILITOT 0.8 01/13/2023   ALKPHOS 77 01/13/2023   AST 23 01/13/2023   ALT 20 01/13/2023   PROT 7.0 01/13/2023   ALBUMIN  4.2 01/13/2023   CALCIUM  9.1 01/13/2023   ANIONGAP 7 01/13/2023   EGFR 73 11/23/2022   Lab Results  Component Value Date   CHOL 86 (L) 11/23/2022   Lab Results  Component Value Date   HDL 33 (L) 11/23/2022   Lab Results  Component Value Date   LDLCALC 27 11/23/2022   Lab  Results  Component Value Date   TRIG 150 (H) 11/23/2022   Lab Results  Component Value Date   CHOLHDL 2.6 11/23/2022   Lab Results  Component Value Date   HGBA1C 5.8 (H) 03/04/2021       Assessment & Plan:  Acute left-sided low back pain without sciatica Assessment & Plan: Pain localized to left kidney area, not associated with typical urinary tract infection symptoms. No history of kidney stones. - UA - negative - encouraged to drink more water and follow-up with urologist -Consider imaging if pain does not improve to rule out kidney stones.  Orders: -     POCT URINALYSIS DIP (CLINITEK)  Non-recurrent acute suppurative otitis media of left ear without spontaneous rupture of tympanic membrane Assessment & Plan: Acute, left ear erythema and effusion Doxycycline  100 mg by mouth TWICE A DAY for 5 days   Orders: -     Doxycycline  Hyclate; Take 1 tablet (100 mg total) by mouth 2 (two) times daily for 5 days.  Dispense: 10 tablet; Refill: 0     Meds ordered this encounter  Medications   doxycycline  (VIBRA -TABS) 100 MG tablet    Sig: Take 1 tablet (100 mg total) by mouth 2 (two) times daily for 5 days.    Dispense:  10 tablet    Refill:  0    Orders Placed This Encounter  Procedures   POCT URINALYSIS DIP (CLINITEK)     Follow-up: Return if symptoms worsen or fail to improve.  An After Visit Summary was printed and given to the patient.  Total time spent on today's visit was 30 minutes, including both face-to-face time and nonface-to-face time personally spent on review of chart (labs and imaging), discussing labs and goals, discussing further work-up, treatment options, referrals to specialist if needed, reviewing outside records if pertinent, answering patient's questions, and coordinating care.    Delford Felling, FNP [facility] [phone]

## 2023-04-17 NOTE — Assessment & Plan Note (Signed)
 Acute, left ear erythema and effusion Doxycycline  100 mg by mouth TWICE A DAY for 5 days

## 2023-04-17 NOTE — Telephone Encounter (Signed)
 Pharmacy Patient Advocate Encounter   Received notification from Fax that prior authorization for repatha  is required/requested.   Insurance verification completed.   The patient is insured through [employer] .   Per test claim: The current 30 day co-pay is, $12.15.  No PA needed at this time. This test claim was processed through [facility]- copay amounts may vary at other pharmacies due to pharmacy/plan contracts, or as the patient moves through the different stages of their insurance plan.       I called patient pharmacy bc we got a fax from [place] saying med needs pa but pt humana works just fine. I gave pharmacy [employer] info and they also got a paid claim

## 2023-04-18 ENCOUNTER — Encounter: Payer: Managed Care, Other (non HMO) | Admitting: Internal Medicine

## 2023-04-20 ENCOUNTER — Other Ambulatory Visit (HOSPITAL_COMMUNITY): Payer: Self-pay

## 2023-04-20 ENCOUNTER — Ambulatory Visit (HOSPITAL_BASED_OUTPATIENT_CLINIC_OR_DEPARTMENT_OTHER): Payer: Managed Care, Other (non HMO) | Admitting: Family

## 2023-04-20 ENCOUNTER — Other Ambulatory Visit (HOSPITAL_BASED_OUTPATIENT_CLINIC_OR_DEPARTMENT_OTHER): Payer: Self-pay | Admitting: Family

## 2023-04-20 ENCOUNTER — Encounter: Payer: Self-pay | Admitting: Hematology and Oncology

## 2023-04-20 NOTE — Telephone Encounter (Signed)
Pt was scheduled as in office as there were no open tele preop appts' in time for his procedure.   Pt agreed to the appt in office. I will update the requesting office as well.

## 2023-04-20 NOTE — Telephone Encounter (Signed)
Mr. Rieth was a no-show for his 04/20/23 OV. Will route to preop callback team so they are aware.   Appears he was recommended for phone visit but was scheduled as in-person.  Last seen by Dr. Tresa Endo 12/15/22 recommended for 6-7 mos follow up.   Alver Sorrow, NP

## 2023-04-26 ENCOUNTER — Inpatient Hospital Stay: Payer: Managed Care, Other (non HMO)

## 2023-04-28 ENCOUNTER — Inpatient Hospital Stay: Payer: Managed Care, Other (non HMO)

## 2023-04-28 VITALS — BP 145/73 | HR 72 | Temp 98.0°F | Resp 18 | Ht 71.0 in | Wt 224.0 lb

## 2023-04-28 DIAGNOSIS — D751 Secondary polycythemia: Secondary | ICD-10-CM

## 2023-04-28 LAB — CBC WITH DIFFERENTIAL (CANCER CENTER ONLY)
Abs Immature Granulocytes: 0.03 10*3/uL (ref 0.00–0.07)
Basophils Absolute: 0.1 10*3/uL (ref 0.0–0.1)
Basophils Relative: 1 %
Eosinophils Absolute: 0.1 10*3/uL (ref 0.0–0.5)
Eosinophils Relative: 1 %
HCT: 47.4 % (ref 39.0–52.0)
Hemoglobin: 15.1 g/dL (ref 13.0–17.0)
Immature Granulocytes: 0 %
Lymphocytes Relative: 18 %
Lymphs Abs: 1.9 10*3/uL (ref 0.7–4.0)
MCH: 26.8 pg (ref 26.0–34.0)
MCHC: 31.9 g/dL (ref 30.0–36.0)
MCV: 84 fL (ref 80.0–100.0)
Monocytes Absolute: 0.5 10*3/uL (ref 0.1–1.0)
Monocytes Relative: 5 %
Neutro Abs: 7.6 10*3/uL (ref 1.7–7.7)
Neutrophils Relative %: 75 %
Platelet Count: 264 10*3/uL (ref 150–400)
RBC: 5.64 MIL/uL (ref 4.22–5.81)
RDW: 14 % (ref 11.5–15.5)
WBC Count: 10.2 10*3/uL (ref 4.0–10.5)
nRBC: 0 % (ref 0.0–0.2)

## 2023-04-28 LAB — FERRITIN: Ferritin: 11 ng/mL — ABNORMAL LOW (ref 24–336)

## 2023-04-28 NOTE — Progress Notes (Signed)
Theodoro Clock presents today for phlebotomy per MD orders. Phlebotomy procedure started at 1548 and ended at 1553 w/16 G set. 550 grams removed. Patient declined 30 minute post procedure observation. VSS at discharge. Patient tolerated procedure well. IV needle removed intact.

## 2023-04-28 NOTE — Patient Instructions (Signed)

## 2023-05-01 ENCOUNTER — Encounter: Payer: Self-pay | Admitting: Physician Assistant

## 2023-05-01 ENCOUNTER — Ambulatory Visit: Payer: Managed Care, Other (non HMO) | Admitting: Physician Assistant

## 2023-05-03 ENCOUNTER — Other Ambulatory Visit: Payer: Self-pay | Admitting: Physician Assistant

## 2023-05-03 DIAGNOSIS — Z96642 Presence of left artificial hip joint: Secondary | ICD-10-CM

## 2023-05-09 ENCOUNTER — Ambulatory Visit: Payer: Self-pay | Admitting: Family Medicine

## 2023-05-09 NOTE — Telephone Encounter (Signed)
 Copied from CRM [id number]. Topic: Clinical - Red Word Triage >> May 09, 2023 10:05 AM Martha Clan wrote: Red Word that prompted transfer to Nurse Triage: Black stool, possible blood, with ulcer.   Chief Complaint: Dark/Black stool Symptoms: Black stool, abdominal pain  Frequency: Constant  Pertinent Negatives: Patient denies vomiting Disposition: [x] ED /[] Urgent Care (no appt availability in office) / [] Appointment(In office/virtual)/ []  [place] [facility]/ [] Home Care/ [x] Refused Recommended Disposition /[] [place] [facility]/ []  Follow-up with PCP Additional Notes: Patient reports he has noticed dark stools for the last 2 weeks. He states that they were intermittent at first but now are present with every BM. Patient states he has also been experiencing abdominal pain which he states is a chronic problem. Patient advised that with his dark stool, medical history, and the fact he is taking Plavix, that he should go to the ED for evaluation for rectal bleeding. Patient declined, stating he has an appointment in 2 days and would prefer to wait. Patient requesting prescription for Doxycyline, stating it is what helped his pain the best in the past. CAL called to report ED refusal.     Reason for Disposition  Taking Coumadin (warfarin) or other strong blood thinner, or known bleeding disorder (e.g., thrombocytopenia)  Answer Assessment - Initial Assessment Questions 1. APPEARANCE of BLOOD: "What color is it?" "Is it passed separately, on the surface of the stool, or mixed in with the stool?"      Dark stool 2. AMOUNT: "How much blood was passed?"      Hard to tell, dark stool 3. FREQUENCY: "How many times has blood been passed with the stools?"      Every bowel movement now 4. ONSET: "When was the blood first seen in the stools?" (Days or weeks)      2 week 5. DIARRHEA: "Is there also some diarrhea?" If Yes, ask: "How many diarrhea stools in the past 24 hours?"      No 6. CONSTIPATION: "Do  you have constipation?" If Yes, ask: "How bad is it?"     Yes, chronic issue  7. RECURRENT SYMPTOMS: "Have you had blood in your stools before?" If Yes, ask: "When was the last time?" and "What happened that time?"      Yes 8. BLOOD THINNERS: "Do you take any blood thinners?" (e.g., Coumadin/warfarin, Pradaxa/dabigatran, aspirin)     Yes, Plavix  9. OTHER SYMPTOMS: "Do you have any other symptoms?"  (e.g., abdomen pain, vomiting, dizziness, fever)     Abdominal pain, weakness  Protocols used: Rectal Bleeding-A-AH

## 2023-05-10 ENCOUNTER — Inpatient Hospital Stay: Payer: Managed Care, Other (non HMO)

## 2023-05-10 ENCOUNTER — Telehealth: Payer: Self-pay | Admitting: Hematology and Oncology

## 2023-05-10 NOTE — Progress Notes (Signed)
 Acute Office Visit  Subjective:    Patient ID: Jacob Collier, male    DOB: 1955-09-26, 68 y.o.   MRN: [id number]  Chief Complaint  Patient presents with   Left hip pain    HPI: Patient is in today for left hip pain, says butt cheek stays tight, had hip replacement done in 2017. Patient states doxycycline helped last time.  Discussed the use of AI scribe software for clinical note transcription with the patient, who gave verbal consent to proceed.  History of Present Illness   The patient, with a past medical history significant for prostate cancer, intestinal surgery, heart surgery, and sleep apnea, presents with a chief complaint of difficulty passing stool. The patient reports that this issue has been ongoing for approximately three weeks. The patient describes the stool as being dark in color at times, but notes that the color was fairly normal during the most recent bowel movement. The patient also mentions a history of blood in the stool, but clarifies that this is not a new symptom. The patient has been using suppositories to aid in bowel movements and has been trying to increase fiber intake. The patient also reports a history of diverticulosis and an internal hemorrhoid, but these have not been causing any significant issues. The patient is currently taking an iron supplement due to low ferritin levels. The patient is also due for a PSA test and expresses concern about the possibility of prostate cancer recurrence.       Past Medical History:  Diagnosis Date   Anxiety    Arthritis    Coronary artery disease    a. S/p DES to RCA in 01/2019 b. s/p CABG x4 (LIMA-LAD, reverse SVG-PDA, reverse SVG-OM1, reverse SVG-1st Diag) in 01/2021   Diverticulosis    Dyspnea    Hard of hearing    Hyperlipidemia    Hypertension    hx of elevation on lyrica, off med and now normal    Paroxysmal SVT (supraventricular tachycardia) (HCC)    Post-op atrial fibrillation    Prostate cancer (HCC)     Wears glasses     Past Surgical History:  Procedure Laterality Date   ANTERIOR CERVICAL DECOMP/DISCECTOMY FUSION N/A 09/03/2021   Procedure: CERVICAL FIVE-SIX, CERVICAL SIX-SEVEN ANTERIOR CERVICAL DECOMPRESSION/DISCECTOMY FUSION;  Surgeon: Julio Sicks, MD;  Location: [facility] OR;  Service: Neurosurgery;  Laterality: N/A;   CARDIAC CATHETERIZATION     with 2 stents    COLECTOMY  05/2018   COLONOSCOPY     CORONARY ARTERY BYPASS GRAFT N/A 01/19/2021   Procedure: CORONARY ARTERY BYPASS GRAFTING (CABG), ON PUMP TIMES FOUR, USING LEFT INTERNAL MAMMARY ARTERY AND ENDOSOCPICALLY HARVESTED RIGHT GREATER SAPHENOUS VEIN;  Surgeon: Corliss Skains, MD;  Location: [facility] OR;  Service: Open Heart Surgery;  Laterality: N/A;   CYSTOSCOPY WITH URETHRAL DILATATION N/A 01/14/2021   Procedure: CYSTOSCOPY WITH BALLOON  DILATATION;  Surgeon: Alfredo Martinez, MD;  Location: [facility] ORS;  Service: Urology;  Laterality: N/A;   EYE SURGERY  02/2020   bilaterl cataracts   IR THORACENTESIS ASP PLEURAL SPACE W/IMG GUIDE  02/02/2021   LEFT HEART CATH AND CORONARY ANGIOGRAPHY N/A 12/24/2020   Procedure: LEFT HEART CATH AND CORONARY ANGIOGRAPHY;  Surgeon: Swaz[place], Peter M, MD;  Location: [facility] INVASIVE CV LAB;  Service: Cardiovascular;  Laterality: N/A;   PROSTATECTOMY     SVT ABLATION N/A 10/20/2022   Procedure: SVT ABLATION;  Surgeon: Maurice Small, MD;  Location: [facility] INVASIVE CV LAB;  Service: Cardiovascular;  Laterality: N/A;   TEE WITHOUT CARDIOVERSION N/A 01/19/2021   Procedure: TRANSESOPHAGEAL ECHOCARDIOGRAM (TEE);  Surgeon: Corliss Skains, MD;  Location: [facility] OR;  Service: Open Heart Surgery;  Laterality: N/A;   TOTAL HIP ARTHROPLASTY Left 11/10/2015   Procedure: LEFT TOTAL HIP ARTHROPLASTY ANTERIOR APPROACH;  Surgeon: Kathryne Hitch, MD;  Location: [facility] OR;  Service: Orthopedics;  Laterality: Left;    Family History  Problem Relation Age of Onset   Alzheimer's disease Mother    Stroke Father     Diabetes Sister    Heart attack Maternal Grandmother    Cancer Maternal Uncle        prostate   Prostate cancer Maternal Uncle    Cancer Maternal Uncle        prostate   Prostate cancer Maternal Uncle    Colon polyps Brother    Colon cancer Neg Hx    Esophageal cancer Neg Hx    Rectal cancer Neg Hx    Stomach cancer Neg Hx     Social History   Socioeconomic History   Marital status: Married    Spouse name: Not on file   Number of children: 0   Years of education: Not on file   Highest education level: Not on file  Occupational History   Occupation: Employed at [employer]  Tobacco Use   Smoking status: Never   Smokeless tobacco: Never  Vaping Use   Vaping status: Never Used  Substance and Sexual Activity   Alcohol use: Not Currently    Alcohol/week: 0.0 standard drinks of alcohol    Comment: rarely   Drug use: No   Sexual activity: Yes    Partners: Female    Birth control/protection: None  Other Topics Concern   Not on file  Social History Narrative   Not on file   Social Drivers of Health   Financial Resource Strain: Low Risk  (08/04/2022)   Overall Financial Resource Strain (CARDIA)    Difficulty of Paying Living Expenses: Not hard at all  Food Insecurity: No Food Insecurity (10/29/2022)   Hunger Vital Sign    Worried About Running Out of Food in the Last Year: Never true    Ran Out of Food in the Last Year: Never true  Transportation Needs: No Transportation Needs (10/29/2022)   PRAPARE - Administrator, Civil Service (Medical): No    Lack of Transportation (Non-Medical): No  Physical Activity: Sufficiently Active (08/04/2022)   Exercise Vital Sign    Days of Exercise per Week: 5 days    Minutes of Exercise per Session: 60 min  Stress: No Stress Concern Present (08/04/2022)   [employer] of Occupational Health - Occupational Stress Questionnaire    Feeling of Stress : Not at all  Social Connections: Moderately Isolated (08/04/2022)    Social Connection and Isolation Panel [NHANES]    Frequency of Communication with Friends and Family: More than three times a week    Frequency of Social Gatherings with Friends and Family: More than three times a week    Attends Religious Services: Never    Database administrator or Organizations: No    Attends Banker Meetings: Never    Marital Status: Married  Catering manager Violence: Not At Risk (10/29/2022)   Humiliation, Afraid, Rape, and Kick questionnaire    Fear of Current or Ex-Partner: No    Emotionally Abused: No    Physically Abused: No    Sexually Abused: No  Outpatient Medications Prior to Visit  Medication Sig Dispense Refill   amLODipine (NORVASC) 5 MG tablet TAKE 1 TABLET BY MOUTH EVERYDAY AT BEDTIME 90 tablet 3   aspirin EC 81 MG tablet Take 81 mg by mouth daily. Swallow whole.     clonazePAM (KLONOPIN) 1 MG tablet TAKE 1 TABLET BY MOUTH TWICE A DAY AS NEEDED FOR ANXIETY 60 tablet 3   clopidogrel (PLAVIX) 75 MG tablet Take 1 tablet (75 mg total) by mouth daily. 90 tablet 3   Eszopiclone 3 MG TABS TAKE 1 TABLET BY MOUTH EVERYDAY AT BEDTIME 30 tablet 5   Evolocumab (REPATHA SURECLICK) 140 MG/ML SOAJ INJECT 140 MG INTO THE SKIN EVERY 14 (FOURTEEN) DAYS. 6 mL 3   ezetimibe (ZETIA) 10 MG tablet TAKE 1 TABLET BY MOUTH EVERY DAY 90 tablet 3   metoprolol succinate (TOPROL-XL) 12.5 mg TB24 24 hr tablet Take 12.5 mg by mouth daily.     Omega-3 Fatty Acids (OMEGA 3 PO) Take 500 mg by mouth every morning.     Phenylephrine HCl (AFRIN ALLERGY NA) Place 2 sprays into the nose 2 (two) times daily.     No facility-administered medications prior to visit.    Allergies  Allergen Reactions   Ranolazine Other (See Comments)    Chest discomfort/Reflux   Amoxicillin Hives   Atorvastatin Other (See Comments)    myalgias  Other reaction(s): Myalgias (intolerance)   Pravastatin Other (See Comments)    myalgias  Other reaction(s): Myalgias (intolerance)    Rosuvastatin Other (See Comments)    myalgias  Other reaction(s): Myalgias (intolerance)   Statins Other (See Comments)    myalgias   Eliquis [Apixaban] Rash and Other (See Comments)    Joint discomfort and elevated BP   Xarelto [Rivaroxaban] Rash    Joint discomfort, elevated BP    Review of Systems  Constitutional:  Negative for appetite change, fatigue and fever.  HENT:  Negative for congestion, ear pain, sinus pressure and sore throat.   Respiratory:  Negative for cough, chest tightness, shortness of breath and wheezing.   Cardiovascular:  Negative for chest pain and palpitations.  Gastrointestinal:  Positive for blood in stool and rectal pain. Negative for abdominal pain, constipation, diarrhea, nausea and vomiting.  Genitourinary:  Negative for dysuria and hematuria.  Musculoskeletal:  Positive for myalgias (Left hip pain). Negative for arthralgias, back pain and joint swelling.  Skin:  Negative for rash.  Neurological:  Negative for dizziness, weakness and headaches.  Psychiatric/Behavioral:  Negative for dysphoric mood. The patient is not nervous/anxious.        Objective:        05/11/2023    3:39 PM 04/28/2023    3:55 PM 04/28/2023    3:43 PM  Vitals with BMI  Height 5\' 11"   5\' 11"   Weight 220 lbs 3 oz  224 lbs  BMI 30.73  31.26  Systolic 130 145 416  Diastolic 82 73 72  Pulse 69 72 73    No data found.   Physical Exam Vitals reviewed.  Constitutional:      Appearance: Normal appearance.  Cardiovascular:     Rate and Rhythm: Normal rate and regular rhythm.     Heart sounds: Normal heart sounds.  Pulmonary:     Effort: Pulmonary effort is normal.     Breath sounds: Normal breath sounds.  Abdominal:     General: Bowel sounds are normal.     Palpations: Abdomen is soft.     Tenderness: There is  no abdominal tenderness.  Neurological:     Mental Status: He is alert and oriented to person, place, and time.  Psychiatric:        Mood and Affect: Mood  normal.        Behavior: Behavior normal.     Health Maintenance Due  Topic Date Due   Medicare Annual Wellness (AWV)  Never done   DTaP/Tdap/Td (1 - Tdap) Never done   Zoster Vaccines- Shingrix (1 of 2) Never done   INFLUENZA VACCINE  10/06/2022    There are no preventive care reminders to display for this patient.   Lab Results  Component Value Date   TSH 1.570 04/04/2022   Lab Results  Component Value Date   WBC 10.2 04/28/2023   HGB 15.1 04/28/2023   HCT 47.4 04/28/2023   MCV 84.0 04/28/2023   PLT 264 04/28/2023   Lab Results  Component Value Date   NA 137 05/11/2023   K 4.7 05/11/2023   CO2 19 (L) 05/11/2023   GLUCOSE 88 05/11/2023   BUN 18 05/11/2023   CREATININE 1.12 05/11/2023   BILITOT 0.6 05/11/2023   ALKPHOS 107 05/11/2023   AST 23 05/11/2023   ALT 33 05/11/2023   PROT 7.3 05/11/2023   ALBUMIN 4.6 05/11/2023   CALCIUM 9.9 05/11/2023   ANIONGAP 7 01/13/2023   EGFR 71 05/11/2023   Lab Results  Component Value Date   CHOL 85 (L) 05/11/2023   Lab Results  Component Value Date   HDL 39 (L) 05/11/2023   Lab Results  Component Value Date   LDLCALC 25 05/11/2023   Lab Results  Component Value Date   TRIG 114 05/11/2023   Lab Results  Component Value Date   CHOLHDL 2.2 05/11/2023   Lab Results  Component Value Date   HGBA1C 6.0 (H) 05/11/2023       Assessment & Plan:  Prostate CA [facility]) Assessment & Plan: History of prostate cancer. Concern about recurrence. -Order PSA test.  Orders: -     PSA -     POCT URINALYSIS DIP (CLINITEK)  Essential hypertension Assessment & Plan: Patient reports high blood pressure readings at home, but notes improvement after therapeutic phlebotomy. He is also planning to reduce sodium intake and lose weight to help manage blood pressure. -Encourage continued monitoring of blood pressure at home. -Support patient's plans for dietary changes and weight loss.  Orders: -     Comprehensive metabolic  panel -     Hemoglobin A1c -     Iron, TIBC and Ferritin Panel  Mixed hyperlipidemia Assessment & Plan: Well controlled.  Continue to work on eating a healthy diet and exercise.  Labs drawn today.   No major side effects reported, and no issues with compliance. The current medical regimen is effective;  continue present plan with Zetia 10mg , Repatha 140mg  Will adjust medication as needed depending on labs Lab Results  Component Value Date   LDLCALC 25 05/11/2023     Orders: -     Lipid panel  Diverticulitis Assessment & Plan: Chronic intermittent constipation and dark stools. Recent improvement with Doxycycline. History of diverticulosis and intestinal surgery. No current signs of acute diverticulitis. -Start Doxycycline for 10 days. -Continue low fiber diet until symptoms improve, then switch to high fiber diet. -Check stool for occult blood.  Orders: -     Doxycycline Hyclate; Take 1 tablet (100 mg total) by mouth 2 (two) times daily.  Dispense: 20 tablet; Refill: 0  Meds ordered this encounter  Medications   doxycycline (VIBRA-TABS) 100 MG tablet    Sig: Take 1 tablet (100 mg total) by mouth 2 (two) times daily.    Dispense:  20 tablet    Refill:  0    Orders Placed This Encounter  Procedures   PSA   Comprehensive metabolic panel   Hemoglobin A1c   Lipid panel   Iron, TIBC and Ferritin Panel   POCT URINALYSIS DIP (CLINITEK)        Follow-up: No follow-ups on file.  An After Visit Summary was printed and given to the patient.    I,Lauren M Auman,acting as a Neurosurgeon for [employer], PA.,have documented all relevant documentation on the behalf of Langley Gauss, PA,as directed by  Langley Gauss, PA while in the presence of Langley Gauss, [name].    Langley Gauss, [name] [facility] [phone]

## 2023-05-11 ENCOUNTER — Ambulatory Visit: Payer: Managed Care, Other (non HMO) | Admitting: Physician Assistant

## 2023-05-11 ENCOUNTER — Encounter: Payer: Self-pay | Admitting: Physician Assistant

## 2023-05-11 VITALS — BP 130/82 | HR 69 | Temp 97.5°F | Ht 71.0 in | Wt 220.2 lb

## 2023-05-11 DIAGNOSIS — E782 Mixed hyperlipidemia: Secondary | ICD-10-CM

## 2023-05-11 DIAGNOSIS — K5792 Diverticulitis of intestine, part unspecified, without perforation or abscess without bleeding: Secondary | ICD-10-CM

## 2023-05-11 DIAGNOSIS — I10 Essential (primary) hypertension: Secondary | ICD-10-CM | POA: Diagnosis not present

## 2023-05-11 DIAGNOSIS — C61 Malignant neoplasm of prostate: Secondary | ICD-10-CM

## 2023-05-11 LAB — POCT URINALYSIS DIP (CLINITEK)
Bilirubin, UA: NEGATIVE
Glucose, UA: NEGATIVE mg/dL
Ketones, POC UA: NEGATIVE mg/dL
Leukocytes, UA: NEGATIVE
Nitrite, UA: POSITIVE — AB
POC PROTEIN,UA: NEGATIVE
Spec Grav, UA: 1.02 (ref 1.010–1.025)
Urobilinogen, UA: 0.2 U/dL
pH, UA: 6 (ref 5.0–8.0)

## 2023-05-11 MED ORDER — DOXYCYCLINE HYCLATE 100 MG PO TABS
100.0000 mg | ORAL_TABLET | Freq: Two times a day (BID) | ORAL | 0 refills | Status: DC
Start: 2023-05-11 — End: 2023-07-12

## 2023-05-12 ENCOUNTER — Encounter: Payer: Self-pay | Admitting: Physician Assistant

## 2023-05-12 LAB — COMPREHENSIVE METABOLIC PANEL
ALT: 33 IU/L (ref 0–44)
AST: 23 IU/L (ref 0–40)
Albumin: 4.6 g/dL (ref 3.9–4.9)
Alkaline Phosphatase: 107 IU/L (ref 44–121)
BUN/Creatinine Ratio: 16 (ref 10–24)
BUN: 18 mg/dL (ref 8–27)
Bilirubin Total: 0.6 mg/dL (ref 0.0–1.2)
CO2: 19 mmol/L — ABNORMAL LOW (ref 20–29)
Calcium: 9.9 mg/dL (ref 8.6–10.2)
Chloride: 104 mmol/L (ref 96–106)
Creatinine, Ser: 1.12 mg/dL (ref 0.76–1.27)
Globulin, Total: 2.7 g/dL (ref 1.5–4.5)
Glucose: 88 mg/dL (ref 70–99)
Potassium: 4.7 mmol/L (ref 3.5–5.2)
Sodium: 137 mmol/L (ref 134–144)
Total Protein: 7.3 g/dL (ref 6.0–8.5)
eGFR: 71 mL/min/{1.73_m2} (ref >59)

## 2023-05-12 LAB — HEMOGLOBIN A1C
Est. average glucose Bld gHb Est-mCnc: 126 mg/dL
Hgb A1c MFr Bld: 6 % — ABNORMAL HIGH (ref 4.8–5.6)

## 2023-05-12 LAB — LIPID PANEL
Chol/HDL Ratio: 2.2 ratio (ref 0.0–5.0)
Cholesterol, Total: 85 mg/dL — ABNORMAL LOW (ref 100–199)
HDL: 39 mg/dL — ABNORMAL LOW (ref >39)
LDL Chol Calc (NIH): 25 mg/dL (ref 0–99)
Triglycerides: 114 mg/dL (ref 0–149)
VLDL Cholesterol Cal: 21 mg/dL (ref 5–40)

## 2023-05-12 LAB — IRON,TIBC AND FERRITIN PANEL
Ferritin: 90 ng/mL (ref 30–400)
Iron Saturation: 25 % (ref 15–55)
Iron: 92 ug/dL (ref 38–169)
Total Iron Binding Capacity: 374 ug/dL (ref 250–450)
UIBC: 282 ug/dL (ref 111–343)

## 2023-05-12 LAB — PSA: Prostate Specific Ag, Serum: 0.2 ng/mL (ref 0.0–4.0)

## 2023-05-13 DIAGNOSIS — K5792 Diverticulitis of intestine, part unspecified, without perforation or abscess without bleeding: Secondary | ICD-10-CM | POA: Insufficient documentation

## 2023-05-13 NOTE — Assessment & Plan Note (Signed)
 History of prostate cancer. Concern about recurrence. -Order PSA test.

## 2023-05-13 NOTE — Assessment & Plan Note (Signed)
 Well controlled.  Continue to work on eating a healthy diet and exercise.  Labs drawn today.   No major side effects reported, and no issues with compliance. The current medical regimen is effective;  continue present plan with Zetia 10mg , Repatha 140mg  Will adjust medication as needed depending on labs Lab Results  Component Value Date   LDLCALC 25 05/11/2023

## 2023-05-13 NOTE — Assessment & Plan Note (Signed)
 Chronic intermittent constipation and dark stools. Recent improvement with Doxycycline. History of diverticulosis and intestinal surgery. No current signs of acute diverticulitis. -Start Doxycycline for 10 days. -Continue low fiber diet until symptoms improve, then switch to high fiber diet. -Check stool for occult blood.

## 2023-05-13 NOTE — Assessment & Plan Note (Signed)
 Patient reports high blood pressure readings at home, but notes improvement after therapeutic phlebotomy. He is also planning to reduce sodium intake and lose weight to help manage blood pressure. -Encourage continued monitoring of blood pressure at home. -Support patient's plans for dietary changes and weight loss.

## 2023-05-16 ENCOUNTER — Telehealth: Payer: Self-pay | Admitting: Physician Assistant

## 2023-05-22 ENCOUNTER — Encounter: Payer: Managed Care, Other (non HMO) | Admitting: Internal Medicine

## 2023-05-23 ENCOUNTER — Inpatient Hospital Stay: Payer: Managed Care, Other (non HMO)

## 2023-05-23 ENCOUNTER — Inpatient Hospital Stay: Payer: Managed Care, Other (non HMO) | Admitting: Physician Assistant

## 2023-05-24 ENCOUNTER — Ambulatory Visit: Payer: Managed Care, Other (non HMO) | Admitting: Physician Assistant

## 2023-05-24 ENCOUNTER — Other Ambulatory Visit: Payer: Self-pay | Admitting: Cardiovascular Disease

## 2023-06-04 ENCOUNTER — Other Ambulatory Visit: Payer: Self-pay | Admitting: Family Medicine

## 2023-06-04 DIAGNOSIS — F5101 Primary insomnia: Secondary | ICD-10-CM

## 2023-06-06 ENCOUNTER — Other Ambulatory Visit: Payer: Self-pay | Admitting: Physician Assistant

## 2023-06-06 DIAGNOSIS — D751 Secondary polycythemia: Secondary | ICD-10-CM

## 2023-06-07 ENCOUNTER — Inpatient Hospital Stay (HOSPITAL_BASED_OUTPATIENT_CLINIC_OR_DEPARTMENT_OTHER): Admitting: Physician Assistant

## 2023-06-07 ENCOUNTER — Inpatient Hospital Stay: Payer: Self-pay | Attending: Hematology and Oncology

## 2023-06-07 ENCOUNTER — Inpatient Hospital Stay: Payer: Self-pay

## 2023-06-07 VITALS — BP 132/82 | HR 64 | Temp 97.7°F | Resp 18 | Ht 71.0 in | Wt 222.1 lb

## 2023-06-07 DIAGNOSIS — G4733 Obstructive sleep apnea (adult) (pediatric): Secondary | ICD-10-CM | POA: Insufficient documentation

## 2023-06-07 DIAGNOSIS — D751 Secondary polycythemia: Secondary | ICD-10-CM | POA: Diagnosis not present

## 2023-06-07 DIAGNOSIS — Z79890 Hormone replacement therapy (postmenopausal): Secondary | ICD-10-CM | POA: Insufficient documentation

## 2023-06-07 DIAGNOSIS — Z8546 Personal history of malignant neoplasm of prostate: Secondary | ICD-10-CM | POA: Insufficient documentation

## 2023-06-07 LAB — CBC WITH DIFFERENTIAL (CANCER CENTER ONLY)
Abs Immature Granulocytes: 0.03 10*3/uL (ref 0.00–0.07)
Basophils Absolute: 0.1 10*3/uL (ref 0.0–0.1)
Basophils Relative: 1 %
Eosinophils Absolute: 0.2 10*3/uL (ref 0.0–0.5)
Eosinophils Relative: 2 %
HCT: 46.7 % (ref 39.0–52.0)
Hemoglobin: 15.1 g/dL (ref 13.0–17.0)
Immature Granulocytes: 0 %
Lymphocytes Relative: 14 %
Lymphs Abs: 1.4 10*3/uL (ref 0.7–4.0)
MCH: 26.4 pg (ref 26.0–34.0)
MCHC: 32.3 g/dL (ref 30.0–36.0)
MCV: 81.6 fL (ref 80.0–100.0)
Monocytes Absolute: 0.7 10*3/uL (ref 0.1–1.0)
Monocytes Relative: 7 %
Neutro Abs: 7.7 10*3/uL (ref 1.7–7.7)
Neutrophils Relative %: 76 %
Platelet Count: 227 10*3/uL (ref 150–400)
RBC: 5.72 MIL/uL (ref 4.22–5.81)
RDW: 16.5 % — ABNORMAL HIGH (ref 11.5–15.5)
WBC Count: 10.1 10*3/uL (ref 4.0–10.5)
nRBC: 0 % (ref 0.0–0.2)

## 2023-06-07 NOTE — Progress Notes (Signed)
 [facility] [phone]   [phone]  PROGRESS NOTE  Patient Care Team: Mercy Stall, MD as PCP - General (Family Medicine) Millicent Ally, MD as PCP - Cardiology (Cardiology) Mealor, Donnamae Gaba, MD as PCP - Electrophysiology (Cardiology) Samson Croak, MD as Consulting Physician (Urology) Goodrich, Callie E, PA-C as Physician Assistant (Cardiology) Beverlyn Buckles, MD as Referring Physician (Neurology)  Hematological/Oncological History # Polycythemia, secondary. Due to OSA/Exogenous Testosterone   06/11/2019: WBC 6.9, Hgb 15.5, MCV 91.4, Plt 219 04/01/2020: WBC 8.6, Hgb 18.4, MCV 87, Plt 219 08/03/2020: WBC 8.8, Hgb 19.3, MCV 88.7, Plt 187 09/09/2020: establish care with Dr. Rosaline Coma. Hgb 18.2  03/17/2021: WBC 9.3, Hgb 14.6, MCV 88.8, Plt 170   Interval History:  Jacob Collier 68 y.o. male with medical history significant for secondary polycythemia who presents for a follow up visit. The patient's last visit was on 03/28/2023. In the interim since the last visit he has undergone phlebotomies x3.  On exam today Jacob Collier reports he is feeling well with overall stable energy and appetite. He is able to complete his daily activities on his own. He denies nausea, vomiting or bowel habit changes. He denies easy bruising or signs of bleeding. He denies fevers, chills, night, sweats, shortness of breath, chest pain or cough. He has no other complaints. Rest of the 10 point ROS is below.   MEDICAL HISTORY:  Past Medical History:  Diagnosis Date   Anxiety    Arthritis    Coronary artery disease    a. S/p DES to RCA in 01/2019 b. s/p CABG x4 (LIMA-LAD, reverse SVG-PDA, reverse SVG-OM1, reverse SVG-1st Diag) in 01/2021   Diverticulosis    Dyspnea    Hard of hearing    Hyperlipidemia    Hypertension    hx of elevation on lyrica , off med and now normal    Paroxysmal SVT (supraventricular tachycardia) (HCC)    Post-op atrial fibrillation    Prostate cancer  (HCC)    Wears glasses     SURGICAL HISTORY: Past Surgical History:  Procedure Laterality Date   ANTERIOR CERVICAL DECOMP/DISCECTOMY FUSION N/A 09/03/2021   Procedure: CERVICAL FIVE-SIX, CERVICAL SIX-SEVEN ANTERIOR CERVICAL DECOMPRESSION/DISCECTOMY FUSION;  Surgeon: Agustina Aldrich, MD;  Location: [facility] OR;  Service: Neurosurgery;  Laterality: N/A;   CARDIAC CATHETERIZATION     with 2 stents    COLECTOMY  05/2018   COLONOSCOPY     CORONARY ARTERY BYPASS GRAFT N/A 01/19/2021   Procedure: CORONARY ARTERY BYPASS GRAFTING (CABG), ON PUMP TIMES FOUR, USING LEFT INTERNAL MAMMARY ARTERY AND ENDOSOCPICALLY HARVESTED RIGHT GREATER SAPHENOUS VEIN;  Surgeon: Hilarie Lovely, MD;  Location: [facility] OR;  Service: Open Heart Surgery;  Laterality: N/A;   CYSTOSCOPY WITH URETHRAL DILATATION N/A 01/14/2021   Procedure: CYSTOSCOPY WITH BALLOON  DILATATION;  Surgeon: Erman Hayward, MD;  Location: [facility] ORS;  Service: Urology;  Laterality: N/A;   EYE SURGERY  02/2020   bilaterl cataracts   IR THORACENTESIS ASP PLEURAL SPACE W/IMG GUIDE  02/02/2021   LEFT HEART CATH AND CORONARY ANGIOGRAPHY N/A 12/24/2020   Procedure: LEFT HEART CATH AND CORONARY ANGIOGRAPHY;  Surgeon: Swaziland, Peter M, MD;  Location: [facility] INVASIVE CV LAB;  Service: Cardiovascular;  Laterality: N/A;   PROSTATECTOMY     SVT ABLATION N/A 10/20/2022   Procedure: SVT ABLATION;  Surgeon: Efraim Grange, MD;  Location: [facility] INVASIVE CV LAB;  Service: Cardiovascular;  Laterality: N/A;   TEE WITHOUT CARDIOVERSION N/A 01/19/2021   Procedure: TRANSESOPHAGEAL  ECHOCARDIOGRAM (TEE);  Surgeon: Hilarie Lovely, MD;  Location: [facility] OR;  Service: Open Heart Surgery;  Laterality: N/A;   TOTAL HIP ARTHROPLASTY Left 11/10/2015   Procedure: LEFT TOTAL HIP ARTHROPLASTY ANTERIOR APPROACH;  Surgeon: Arnie Lao, MD;  Location: [facility] OR;  Service: Orthopedics;  Laterality: Left;    SOCIAL HISTORY: Social History   Socioeconomic History   Marital status:  Married    Spouse name: Not on file   Number of children: 0   Years of education: Not on file   Highest education level: Not on file  Occupational History   Occupation: Employed at [employer]  Tobacco Use   Smoking status: Never   Smokeless tobacco: Never  Vaping Use   Vaping status: Never Used  Substance and Sexual Activity   Alcohol use: Not Currently    Alcohol/week: 0.0 standard drinks of alcohol    Comment: rarely   Drug use: No   Sexual activity: Yes    Partners: Female    Birth control/protection: None  Other Topics Concern   Not on file  Social History Narrative   Not on file   Social Drivers of Health   Financial Resource Strain: Low Risk  (08/04/2022)   Overall Financial Resource Strain (CARDIA)    Difficulty of Paying Living Expenses: Not hard at all  Food Insecurity: No Food Insecurity (10/29/2022)   Hunger Vital Sign    Worried About Running Out of Food in the Last Year: Never true    Ran Out of Food in the Last Year: Never true  Transportation Needs: No Transportation Needs (10/29/2022)   PRAPARE - Administrator, Civil Service (Medical): No    Lack of Transportation (Non-Medical): No  Physical Activity: Sufficiently Active (08/04/2022)   Exercise Vital Sign    Days of Exercise per Week: 5 days    Minutes of Exercise per Session: 60 min  Stress: No Stress Concern Present (08/04/2022)   [employer] of Occupational Health - Occupational Stress Questionnaire    Feeling of Stress : Not at all  Social Connections: Moderately Isolated (08/04/2022)   Social Connection and Isolation Panel [NHANES]    Frequency of Communication with Friends and Family: More than three times a week    Frequency of Social Gatherings with Friends and Family: More than three times a week    Attends Religious Services: Never    Database administrator or Organizations: No    Attends Banker Meetings: Never    Marital Status: Married  Catering manager  Violence: Not At Risk (10/29/2022)   Humiliation, Afraid, Rape, and Kick questionnaire    Fear of Current or Ex-Partner: No    Emotionally Abused: No    Physically Abused: No    Sexually Abused: No    FAMILY HISTORY: Family History  Problem Relation Age of Onset   Alzheimer's disease Mother    Stroke Father    Diabetes Sister    Heart attack Maternal Grandmother    Cancer Maternal Uncle        prostate   Prostate cancer Maternal Uncle    Cancer Maternal Uncle        prostate   Prostate cancer Maternal Uncle    Colon polyps Brother    Colon cancer Neg Hx    Esophageal cancer Neg Hx    Rectal cancer Neg Hx    Stomach cancer Neg Hx     ALLERGIES:  is allergic to ranolazine, amoxicillin , atorvastatin ,  pravastatin , rosuvastatin , statins, eliquis  [apixaban ], and xarelto  [rivaroxaban ].  MEDICATIONS:  Current Outpatient Medications  Medication Sig Dispense Refill   amLODipine  (NORVASC ) 5 MG tablet TAKE 1 TABLET BY MOUTH EVERYDAY AT BEDTIME 90 tablet 3   aspirin  EC 81 MG tablet Take 81 mg by mouth daily. Swallow whole.     clonazePAM  (KLONOPIN ) 1 MG tablet TAKE 1 TABLET BY MOUTH TWICE A DAY AS NEEDED FOR ANXIETY 60 tablet 3   clopidogrel  (PLAVIX ) 75 MG tablet Take 1 tablet (75 mg total) by mouth daily. 90 tablet 3   doxycycline  (VIBRA -TABS) 100 MG tablet Take 1 tablet (100 mg total) by mouth 2 (two) times daily. 20 tablet 0   Eszopiclone  3 MG TABS TAKE 1 TABLET BY MOUTH EVERYDAY AT BEDTIME 30 tablet 1   Evolocumab  (REPATHA  SURECLICK) 140 MG/ML SOAJ INJECT 140 MG INTO THE SKIN EVERY 14 (FOURTEEN) DAYS. 6 mL 3   ezetimibe  (ZETIA ) 10 MG tablet TAKE 1 TABLET BY MOUTH EVERY DAY 90 tablet 2   metoprolol  succinate (TOPROL -XL) 12.5 mg TB24 24 hr tablet Take 12.5 mg by mouth daily.     Omega-3 Fatty Acids (OMEGA 3 PO) Take 500 mg by mouth every morning.     Phenylephrine  HCl (AFRIN ALLERGY NA) Place 2 sprays into the nose 2 (two) times daily.     No current facility-administered medications  for this visit.    REVIEW OF SYSTEMS:   Constitutional: ( - ) fevers, ( - )  chills , ( - ) night sweats Eyes: ( - ) blurriness of vision, ( - ) double vision, ( - ) watery eyes Ears, nose, mouth, throat, and face: ( - ) mucositis, ( - ) sore throat Respiratory: ( - ) cough, ( - ) dyspnea, ( - ) wheezes Cardiovascular: ( - ) palpitation, ( - ) chest discomfort, ( - ) lower extremity swelling Gastrointestinal:  ( - ) nausea, ( - ) heartburn, ( - ) change in bowel habits Skin: ( - ) abnormal skin rashes Lymphatics: ( - ) new lymphadenopathy, ( - ) easy bruising Neurological: ( - ) numbness, ( - ) tingling, ( - ) new weaknesses Behavioral/Psych: ( - ) mood change, ( - ) new changes  All other systems were reviewed with the patient and are negative.  PHYSICAL EXAMINATION:  Vitals:   06/07/23 1515  BP: 132/82  Pulse: 64  Resp: 18  Temp: 97.7 F (36.5 C)  SpO2: 98%     Filed Weights   06/07/23 1515  Weight: 222 lb 1.6 oz (100.7 kg)      GENERAL: Well-appearing elderly Caucasian male, alert, no distress and comfortable SKIN: skin color, texture, turgor are normal, no rashes or significant lesions EYES: conjunctiva are pink and non-injected, sclera clear LUNGS: clear to auscultation and percussion with normal breathing effort HEART: regular rate & rhythm and no murmurs and no lower extremity edema Musculoskeletal: no cyanosis of digits and no clubbing  PSYCH: alert & oriented x 3, fluent speech NEURO: no focal motor/sensory deficits  LABORATORY DATA:  I have reviewed the data as listed    Latest Ref Rng & Units 06/07/2023    2:46 PM 04/28/2023    3:18 PM 04/12/2023    3:03 PM  CBC  WBC 4.0 - 10.5 K/uL 10.1  10.2  7.2   Hemoglobin 13.0 - 17.0 g/dL 95.2  84.1  32.4   Hematocrit 39.0 - 52.0 % 46.7  47.4  48.0   Platelets 150 - 400 K/uL  227  264  225        Latest Ref Rng & Units 05/11/2023    4:46 PM 01/13/2023    2:55 PM 12/04/2022    5:50 AM  CMP  Glucose 70 - 99 mg/dL  88  87  161   BUN 8 - 27 mg/dL 18  15  11    Creatinine 0.76 - 1.27 mg/dL 0.96  0.45  4.09   Sodium 134 - 144 mmol/L 137  138  138   Potassium 3.5 - 5.2 mmol/L 4.7  3.9  4.0   Chloride 96 - 106 mmol/L 104  108  108   CO2 20 - 29 mmol/L 19  23  20    Calcium  8.6 - 10.2 mg/dL 9.9  9.1  8.5   Total Protein 6.0 - 8.5 g/dL 7.3  7.0  6.0   Total Bilirubin 0.0 - 1.2 mg/dL 0.6  0.8  0.3   Alkaline Phos 44 - 121 IU/L 107  77  58   AST 0 - 40 IU/L 23  23  22    ALT 0 - 44 IU/L 33  20  29     RADIOGRAPHIC STUDIES: No results found.  ASSESSMENT & PLAN Jacob Collier is a 68 y.o.  male with medical history significant for secondary polycythemia who presents for a follow up visit.   After review of the labs, review of the records, and discussion with the patient the patients findings are most consistent with a secondary polycythemia.  The patient has 2 possible sources including obstructive sleep apnea or exogenous testosterone  use.  Polysomnography was ordered but not performed in the interim since her last visit.  He also had a CABG procedure after which time his apnea symptoms resolved, possibly due to marked weight loss.  At this time his hemoglobin has normalized and the patient has restarted his testosterone  shots.  I have once again expressed to him my recommendation the testosterone  shots are not advisable in the setting of polycythemia and his prior history of prostate cancer.  He notes that he will consider polysomnography testing if his hemoglobin were to increase at the next visit.  # Polycythemia, Secondary to OSA and Exogenous Testosterone  -- Findings at this time are most consistent with a secondary polycythemia due to either obstructive sleep apnea or exogenous testosterone  injections. --Hgb today normalized at 14.6, having dropped from after his CABG surgery in Nov 2022.  --negative JAK2 with reflex and BCR/ABL FISH panels at last visit.  --We discussed how it is controversial to perform  phlebotomies outside the setting of polycythemia vera.  The patient reports that he feels much better and would be willing to talk with his sleep medicine doctor, in the interim we will perform phlebotomies as a temporizing measure to see if this improves some of his symptoms. PLAN: --Labs today show white blood cell 10.1, hemoglobin 15.1, MCV 81.6, platelets 227 --No indication for phlebotomies at this time. Patient is feeling well and Hgb/Hct levels are within normal range.  -- Patient has a diagnosis of OSA but is having difficulty with the CPAP machine. Patient notes he is not able to tolerate CPAP or nasal CPAP and is open other options. --Return to clinic in 3 months with repeat labs   #Prostate Cancer --s/p prostatectomy in 2015, biochemical recurrence treated in 2018 with radiation therapy --currently follows with [facility] -- Discussed with patient that testosterone  shots in the setting of history of prostate cancer is not advisable. -- Defer  management to urology, but patient wanted us  to continue to monitoring for this disease would be happy to oblige.  No orders of the defined types were placed in this encounter.   All questions were answered. The patient knows to call the clinic with any problems, questions or concerns.  A total of more than 30 minutes were spent on this encounter with face-to-face time and non-face-to-face time, including preparing to see the patient, ordering tests and/or medications, counseling the patient and coordination of care as outlined above.   Wyline Hearing PA-C Dept of Hematology and Oncology [facility] Phone: [phone]   06/08/2023 9:17 AM

## 2023-06-08 ENCOUNTER — Encounter: Payer: Self-pay | Admitting: Hematology and Oncology

## 2023-06-08 LAB — FERRITIN: Ferritin: 21 ng/mL — ABNORMAL LOW (ref 24–336)

## 2023-06-12 ENCOUNTER — Encounter: Payer: Self-pay | Admitting: Hematology and Oncology

## 2023-06-12 ENCOUNTER — Other Ambulatory Visit: Payer: Self-pay

## 2023-06-12 DIAGNOSIS — I25110 Atherosclerotic heart disease of native coronary artery with unstable angina pectoris: Secondary | ICD-10-CM

## 2023-06-16 ENCOUNTER — Other Ambulatory Visit: Payer: Self-pay

## 2023-06-16 MED ORDER — BELSOMRA 5 MG PO TABS: 5.0000 mg | ORAL_TABLET | Freq: Every evening | ORAL | 2 refills | Status: DC | PRN

## 2023-07-10 ENCOUNTER — Other Ambulatory Visit: Payer: Self-pay

## 2023-07-10 DIAGNOSIS — I25110 Atherosclerotic heart disease of native coronary artery with unstable angina pectoris: Secondary | ICD-10-CM

## 2023-07-11 ENCOUNTER — Other Ambulatory Visit: Payer: Self-pay | Admitting: Family Medicine

## 2023-07-11 ENCOUNTER — Other Ambulatory Visit: Payer: Self-pay | Admitting: Cardiovascular Disease

## 2023-07-11 DIAGNOSIS — R9721 Rising PSA following treatment for malignant neoplasm of prostate: Secondary | ICD-10-CM | POA: Diagnosis not present

## 2023-07-11 DIAGNOSIS — I25110 Atherosclerotic heart disease of native coronary artery with unstable angina pectoris: Secondary | ICD-10-CM

## 2023-07-11 NOTE — Telephone Encounter (Signed)
 Duplicate request, cancelling reorder and closing encounter as medication was refilled today and sent to preferred pharmacy.

## 2023-07-11 NOTE — Telephone Encounter (Signed)
 Copied from CRM [id number]. Topic: Clinical - Medication Refill >> Jul 11, 2023  9:51 AM Rosaria Common wrote: Most Recent Primary Care Visit:  Provider: Odilia Bennett  Department: [facility]  Visit Type: OFFICE VISIT  Date: 05/11/2023  Medication: clonazePAM  (KLONOPIN ) 1 MG tablet  Has the patient contacted their pharmacy? Yes (Agent: If no, request that the patient contact the pharmacy for the refill. If patient does not wish to contact the pharmacy document the reason why and proceed with request.) (Agent: If yes, when and what did the pharmacy advise?)  Is this the correct pharmacy for this prescription? Yes If no, delete pharmacy and type the correct one.  This is the patient's preferred pharmacy:  [id number] - [place], [place] - [address] AT [facility] [address] [place] [address] Phone: [phone] Fax: [phone]     Has the prescription been filled recently? Yes  Is the patient out of the medication? No  Has the patient been seen for an appointment in the last year OR does the patient have an upcoming appointment? Yes  Can we respond through MyChart? Yes  Agent: Please be advised that Rx refills may take up to 3 business days. We ask that you follow-up with your pharmacy.

## 2023-07-12 ENCOUNTER — Encounter: Payer: Self-pay | Admitting: Hematology and Oncology

## 2023-07-12 ENCOUNTER — Other Ambulatory Visit: Payer: Self-pay

## 2023-07-12 ENCOUNTER — Ambulatory Visit (INDEPENDENT_AMBULATORY_CARE_PROVIDER_SITE_OTHER): Admitting: Physician Assistant

## 2023-07-12 ENCOUNTER — Encounter: Payer: Self-pay | Admitting: Physician Assistant

## 2023-07-12 VITALS — BP 132/68 | HR 69 | Temp 97.8°F | Ht 71.0 in | Wt 217.8 lb

## 2023-07-12 DIAGNOSIS — K5792 Diverticulitis of intestine, part unspecified, without perforation or abscess without bleeding: Secondary | ICD-10-CM | POA: Diagnosis not present

## 2023-07-12 DIAGNOSIS — I25110 Atherosclerotic heart disease of native coronary artery with unstable angina pectoris: Secondary | ICD-10-CM

## 2023-07-12 DIAGNOSIS — K581 Irritable bowel syndrome with constipation: Secondary | ICD-10-CM

## 2023-07-12 DIAGNOSIS — K589 Irritable bowel syndrome without diarrhea: Secondary | ICD-10-CM | POA: Insufficient documentation

## 2023-07-12 MED ORDER — DOXYCYCLINE HYCLATE 100 MG PO TABS: 100.0000 mg | ORAL_TABLET | Freq: Two times a day (BID) | ORAL | 0 refills | Status: DC

## 2023-07-12 MED ORDER — LINACLOTIDE 290 MCG PO CAPS: 290.0000 ug | ORAL_CAPSULE | Freq: Every day | ORAL | 3 refills | Status: DC

## 2023-07-12 NOTE — Patient Instructions (Signed)
 VISIT SUMMARY:  Today, you were seen for persistent constipation and abdominal discomfort. We discussed your history of intestinal surgery and diverticulosis, and how these conditions are affecting your current symptoms. We reviewed your medications and made some adjustments to help manage your symptoms more effectively.  YOUR PLAN:  -CHRONIC CONSTIPATION: Chronic constipation means having infrequent or difficult bowel movements over a long period. To help manage this, we have prescribed Linzess  and a 10-day course of doxycycline . Please monitor your response to these medications and let us  know if your symptoms persist. We have also provided samples of Linzess  if available.  -DIVERTICULOSIS: Diverticulosis is a condition where small pouches form in the walls of the intestine. Your previous colonoscopy showed these pouches but no active inflammation. Continue taking Nexium as it helps reduce pain and stool abnormalities.  -INCONTINENCE DUE TO SPHINCTER DAMAGE: Incontinence due to sphincter damage means you have difficulty controlling bowel movements because of damage from your previous cancer surgery. You are managing this with pads, and we have discussed that surgical intervention is not recommended due to potential complications.  -HISTORY OF CANCER SURGERY: You had cancer surgery in 2015, which led to complications like adhesions and sphincter damage. These are being managed with surgeries and symptom management strategies.  -ELEVATED A1C: An A1c level of 6.0 indicates slightly elevated blood sugar levels, which could be a sign of prediabetes. Your current management plan is effective, and we will continue to monitor this.  -KLONOPIN  PRESCRIPTION ISSUE: Klonopin  is used to manage your arrhythmia and anxiety. We will confirm the status of your prescription and address any issues with the pharmacy or insurance.  INSTRUCTIONS:  Please follow the prescribed medication plan and monitor your  symptoms. If your constipation or abdominal discomfort persists, contact us . We will also confirm the status of your Klonopin  prescription and address any issues with the pharmacy or insurance.

## 2023-07-12 NOTE — Assessment & Plan Note (Signed)
 Chronic constipation with discomfort and bloating, affecting quality of life. Previous intestinal surgery and adhesions. Current management with Nexium and doxycycline  improved symptoms. Linzess  reconsidered for increased symptom frequency. - Prescribe Linzess  for constipation management. - Prescribe doxycycline  100 mg tablets for 10 days. - Advise to monitor response to doxycycline  and report if symptoms persist. - Provide samples of Linzess  if available.

## 2023-07-12 NOTE — Progress Notes (Signed)
 Acute Office Visit  Subjective:    Patient ID: Jacob Collier, male    DOB: 04/23/1955, 68 y.o.   MRN: [id number]  Chief Complaint  Patient presents with   Constipation    HPI: Patient is in today for constipation  Discussed the use of AI scribe software for clinical note transcription with the patient, who gave verbal consent to proceed.  History of Present Illness   Jacob BLEAU "Wandalee Gust" is a 68 year old male with a history of intestinal surgery and diverticulosis who presents with constipation and abdominal discomfort.  He experiences persistent constipation and abdominal discomfort, primarily located under his short rib on the side. He describes feeling 'blocked up' and has been eating very little over the past three days. He has used suppositories and over-the-counter medications with some stool passage but continues to experience significant gas and bloating, which he feels affects his breathing and blood pressure.  He has a history of intestinal surgery where six inches of his intestine were removed due to complications from cancer surgery in 2015 and subsequent radiation treatments. His intestine had adhered to his bladder, necessitating the surgery. He also has a history of diverticulosis, with three diverticula noted during a previous colonoscopy. His last colonoscopy revealed diverticula but no active diverticulitis or problematic polyps.  He has been using Nexium and doxycycline , which previously alleviated his pain and improved stool consistency. However, after running out of doxycycline , his symptoms have worsened. He experiences fluctuations in weight due to his bowel issues and has noted stripes in his stool, which he attributes to diverticula.  He has a history of heart surgery and arrhythmia, which was managed with Klonopin , helping with his arrhythmia and anxiety. His blood pressure and heart rate are currently stable. His A1c was last checked in March and was 6.0, with  previous concerns about potential diabetes. He drinks Pedialyte regularly to maintain hydration.  Socially, he is retired and enjoys spending time outdoors and reconnecting with family and friends. He describes his retirement as refreshing after working for 55 years.       Past Medical History:  Diagnosis Date   Anxiety    Arthritis    Coronary artery disease    a. S/p DES to RCA in 01/2019 b. s/p CABG x4 (LIMA-LAD, reverse SVG-PDA, reverse SVG-OM1, reverse SVG-1st Diag) in 01/2021   Diverticulosis    Dyspnea    Hard of hearing    Hyperlipidemia    Hypertension    hx of elevation on lyrica , off med and now normal    Paroxysmal SVT (supraventricular tachycardia) (HCC)    Post-op atrial fibrillation    Prostate cancer (HCC)    Wears glasses     Past Surgical History:  Procedure Laterality Date   ANTERIOR CERVICAL DECOMP/DISCECTOMY FUSION N/A 09/03/2021   Procedure: CERVICAL FIVE-SIX, CERVICAL SIX-SEVEN ANTERIOR CERVICAL DECOMPRESSION/DISCECTOMY FUSION;  Surgeon: Agustina Aldrich, MD;  Location: [facility] OR;  Service: Neurosurgery;  Laterality: N/A;   CARDIAC CATHETERIZATION     with 2 stents    COLECTOMY  05/2018   COLONOSCOPY     CORONARY ARTERY BYPASS GRAFT N/A 01/19/2021   Procedure: CORONARY ARTERY BYPASS GRAFTING (CABG), ON PUMP TIMES FOUR, USING LEFT INTERNAL MAMMARY ARTERY AND ENDOSOCPICALLY HARVESTED RIGHT GREATER SAPHENOUS VEIN;  Surgeon: Hilarie Lovely, MD;  Location: [facility] OR;  Service: Open Heart Surgery;  Laterality: N/A;   CYSTOSCOPY WITH URETHRAL DILATATION N/A 01/14/2021   Procedure: CYSTOSCOPY WITH BALLOON  DILATATION;  Surgeon: Erman Hayward,  MD;  Location: [facility] ORS;  Service: Urology;  Laterality: N/A;   EYE SURGERY  02/2020   bilaterl cataracts   IR THORACENTESIS ASP PLEURAL SPACE W/IMG GUIDE  02/02/2021   LEFT HEART CATH AND CORONARY ANGIOGRAPHY N/A 12/24/2020   Procedure: LEFT HEART CATH AND CORONARY ANGIOGRAPHY;  Surgeon: [place], Peter M, MD;  Location: [facility]  INVASIVE CV LAB;  Service: Cardiovascular;  Laterality: N/A;   PROSTATECTOMY     SVT ABLATION N/A 10/20/2022   Procedure: SVT ABLATION;  Surgeon: Efraim Grange, MD;  Location: [facility] INVASIVE CV LAB;  Service: Cardiovascular;  Laterality: N/A;   TEE WITHOUT CARDIOVERSION N/A 01/19/2021   Procedure: TRANSESOPHAGEAL ECHOCARDIOGRAM (TEE);  Surgeon: Hilarie Lovely, MD;  Location: [facility] OR;  Service: Open Heart Surgery;  Laterality: N/A;   TOTAL HIP ARTHROPLASTY Left 11/10/2015   Procedure: LEFT TOTAL HIP ARTHROPLASTY ANTERIOR APPROACH;  Surgeon: Arnie Lao, MD;  Location: [facility] OR;  Service: Orthopedics;  Laterality: Left;    Family History  Problem Relation Age of Onset   Alzheimer's disease Mother    Stroke Father    Diabetes Sister    Heart attack Maternal Grandmother    Cancer Maternal Uncle        prostate   Prostate cancer Maternal Uncle    Cancer Maternal Uncle        prostate   Prostate cancer Maternal Uncle    Colon polyps Brother    Colon cancer Neg Hx    Esophageal cancer Neg Hx    Rectal cancer Neg Hx    Stomach cancer Neg Hx     Social History   Socioeconomic History   Marital status: Married    Spouse name: Not on file   Number of children: 0   Years of education: Not on file   Highest education level: Not on file  Occupational History   Occupation: Employed at [employer]  Tobacco Use   Smoking status: Never   Smokeless tobacco: Never  Vaping Use   Vaping status: Never Used  Substance and Sexual Activity   Alcohol use: Not Currently    Alcohol/week: 0.0 standard drinks of alcohol    Comment: rarely   Drug use: No   Sexual activity: Yes    Partners: Female    Birth control/protection: None  Other Topics Concern   Not on file  Social History Narrative   Not on file   Social Drivers of Health   Financial Resource Strain: Low Risk  (08/04/2022)   Overall Financial Resource Strain (CARDIA)    Difficulty of Paying Living Expenses: Not hard  at all  Food Insecurity: No Food Insecurity (10/29/2022)   Hunger Vital Sign    Worried About Running Out of Food in the Last Year: Never true    Ran Out of Food in the Last Year: Never true  Transportation Needs: No Transportation Needs (10/29/2022)   PRAPARE - Administrator, Civil Service (Medical): No    Lack of Transportation (Non-Medical): No  Physical Activity: Sufficiently Active (08/04/2022)   Exercise Vital Sign    Days of Exercise per Week: 5 days    Minutes of Exercise per Session: 60 min  Stress: No Stress Concern Present (08/04/2022)   [employer] of Occupational Health - Occupational Stress Questionnaire    Feeling of Stress : Not at all  Social Connections: Moderately Isolated (08/04/2022)   Social Connection and Isolation Panel [NHANES]    Frequency of Communication with Friends and  Family: More than three times a week    Frequency of Social Gatherings with Friends and Family: More than three times a week    Attends Religious Services: Never    Database administrator or Organizations: No    Attends Banker Meetings: Never    Marital Status: Married  Catering manager Violence: Not At Risk (10/29/2022)   Humiliation, Afraid, Rape, and Kick questionnaire    Fear of Current or Ex-Partner: No    Emotionally Abused: No    Physically Abused: No    Sexually Abused: No    Outpatient Medications Prior to Visit  Medication Sig Dispense Refill   amLODipine  (NORVASC ) 5 MG tablet TAKE 1 TABLET BY MOUTH EVERYDAY AT BEDTIME 90 tablet 3   aspirin  EC 81 MG tablet Take 81 mg by mouth daily. Swallow whole.     clonazePAM  (KLONOPIN ) 1 MG tablet TAKE 1 TABLET BY MOUTH TWICE A DAY AS NEEDED FOR ANXIETY 60 tablet 0   clopidogrel  (PLAVIX ) 75 MG tablet Take 1 tablet (75 mg total) by mouth daily. 90 tablet 3   Eszopiclone  3 MG TABS TAKE 1 TABLET BY MOUTH EVERYDAY AT BEDTIME 30 tablet 1   Evolocumab  (REPATHA  SURECLICK) 140 MG/ML SOAJ INJECT 140 MG INTO THE SKIN  EVERY 14 (FOURTEEN) DAYS. 6 mL 3   ezetimibe  (ZETIA ) 10 MG tablet TAKE 1 TABLET BY MOUTH EVERY DAY 90 tablet 2   metoprolol  succinate (TOPROL -XL) 12.5 mg TB24 24 hr tablet Take 12.5 mg by mouth daily.     Omega-3 Fatty Acids (OMEGA 3 PO) Take 500 mg by mouth every morning.     Phenylephrine  HCl (AFRIN ALLERGY NA) Place 2 sprays into the nose 2 (two) times daily.     Suvorexant  (BELSOMRA ) 5 MG TABS Take 1 tablet (5 mg total) by mouth at bedtime as needed. 30 tablet 2   doxycycline  (VIBRA -TABS) 100 MG tablet Take 1 tablet (100 mg total) by mouth 2 (two) times daily. 20 tablet 0   No facility-administered medications prior to visit.    Allergies  Allergen Reactions   Ranolazine Other (See Comments)    Chest discomfort/Reflux   Amoxicillin  Hives   Atorvastatin  Other (See Comments)    myalgias  Other reaction(s): Myalgias (intolerance)   Pravastatin  Other (See Comments)    myalgias  Other reaction(s): Myalgias (intolerance)   Rosuvastatin  Other (See Comments)    myalgias  Other reaction(s): Myalgias (intolerance)   Statins Other (See Comments)    myalgias   Eliquis  [Apixaban ] Rash and Other (See Comments)    Joint discomfort and elevated BP   Xarelto  [Rivaroxaban ] Rash    Joint discomfort, elevated BP    Review of Systems  Constitutional:  Negative for appetite change, fatigue and fever.  HENT:  Negative for congestion, ear pain, sinus pressure and sore throat.   Respiratory:  Negative for cough, chest tightness, shortness of breath and wheezing.   Cardiovascular:  Negative for chest pain and palpitations.  Gastrointestinal:  Positive for blood in stool and constipation. Negative for abdominal pain, diarrhea, nausea and vomiting.  Genitourinary:  Negative for dysuria and hematuria.  Musculoskeletal:  Negative for arthralgias, back pain, joint swelling and myalgias.  Skin:  Negative for rash.  Neurological:  Negative for dizziness, weakness and headaches.   Psychiatric/Behavioral:  Negative for dysphoric mood. The patient is not nervous/anxious.        Objective:        07/12/2023    3:47 PM 06/07/2023  3:15 PM 05/11/2023    3:39 PM  Vitals with BMI  Height 5\' 11"  5\' 11"  5\' 11"   Weight 217 lbs 13 oz 222 lbs 2 oz 220 lbs 3 oz  BMI 30.39 30.99 30.73  Systolic 132 132 161  Diastolic 68 82 82  Pulse 69 64 69    Orthostatic VS for the past 72 hrs (Last 3 readings):  Patient Position BP Location  07/12/23 1547 Sitting Left Arm     Physical Exam Vitals reviewed.  Constitutional:      Appearance: Normal appearance.  Cardiovascular:     Rate and Rhythm: Normal rate and regular rhythm.     Heart sounds: Normal heart sounds.  Pulmonary:     Effort: Pulmonary effort is normal.     Breath sounds: Normal breath sounds.  Abdominal:     General: Bowel sounds are normal.     Palpations: Abdomen is soft.     Tenderness: There is no abdominal tenderness.  Neurological:     Mental Status: He is alert and oriented to person, place, and time.  Psychiatric:        Mood and Affect: Mood normal.        Behavior: Behavior normal.     Health Maintenance Due  Topic Date Due   Medicare Annual Wellness (AWV)  Never done   DTaP/Tdap/Td (1 - Tdap) Never done   Zoster Vaccines- Shingrix (1 of 2) Never done    There are no preventive care reminders to display for this patient.   Lab Results  Component Value Date   TSH 1.570 04/04/2022   Lab Results  Component Value Date   WBC 10.1 06/07/2023   HGB 15.1 06/07/2023   HCT 46.7 06/07/2023   MCV 81.6 06/07/2023   PLT 227 06/07/2023   Lab Results  Component Value Date   NA 137 05/11/2023   K 4.7 05/11/2023   CO2 19 (L) 05/11/2023   GLUCOSE 88 05/11/2023   BUN 18 05/11/2023   CREATININE 1.12 05/11/2023   BILITOT 0.6 05/11/2023   ALKPHOS 107 05/11/2023   AST 23 05/11/2023   ALT 33 05/11/2023   PROT 7.3 05/11/2023   ALBUMIN  4.6 05/11/2023   CALCIUM  9.9 05/11/2023   ANIONGAP 7  01/13/2023   EGFR 71 05/11/2023   Lab Results  Component Value Date   CHOL 85 (L) 05/11/2023   Lab Results  Component Value Date   HDL 39 (L) 05/11/2023   Lab Results  Component Value Date   LDLCALC 25 05/11/2023   Lab Results  Component Value Date   TRIG 114 05/11/2023   Lab Results  Component Value Date   CHOLHDL 2.2 05/11/2023   Lab Results  Component Value Date   HGBA1C 6.0 (H) 05/11/2023       Assessment & Plan:  Diverticulitis Assessment & Plan: Diverticulosis with occasional bleeding. Previous colonoscopy showed diverticula without active diverticulitis. Managed with Nexium, reducing pain and stool abnormalities.  Orders: -     Doxycycline  Hyclate; Take 1 tablet (100 mg total) by mouth 2 (two) times daily.  Dispense: 20 tablet; Refill: 0  Irritable bowel syndrome with constipation Assessment & Plan: Chronic constipation with discomfort and bloating, affecting quality of life. Previous intestinal surgery and adhesions. Current management with Nexium and doxycycline  improved symptoms. Linzess  reconsidered for increased symptom frequency. - Prescribe Linzess  for constipation management. - Prescribe doxycycline  100 mg tablets for 10 days. - Advise to monitor response to doxycycline  and report if symptoms persist. -  Provide samples of Linzess  if available.  Orders: -     linaCLOtide ; Take 1 capsule (290 mcg total) by mouth daily before breakfast.  Dispense: 30 capsule; Refill: 3        Meds ordered this encounter  Medications   linaclotide  (LINZESS ) 290 MCG CAPS capsule    Sig: Take 1 capsule (290 mcg total) by mouth daily before breakfast.    Dispense:  30 capsule    Refill:  3   doxycycline  (VIBRA -TABS) 100 MG tablet    Sig: Take 1 tablet (100 mg total) by mouth 2 (two) times daily.    Dispense:  20 tablet    Refill:  0    No orders of the defined types were placed in this encounter.    Follow-up: Return if symptoms worsen or fail to  improve.  An After Visit Summary was printed and given to the patient.    I,Lauren M Auman,acting as a Neurosurgeon for [employer], PA.,have documented all relevant documentation on the behalf of Odilia Bennett, PA,as directed by  Odilia Bennett, PA while in the presence of Odilia Bennett, [name].    Odilia Bennett, [name] [facility] [phone]

## 2023-07-12 NOTE — Assessment & Plan Note (Signed)
 Diverticulosis with occasional bleeding. Previous colonoscopy showed diverticula without active diverticulitis. Managed with Nexium, reducing pain and stool abnormalities.

## 2023-07-13 ENCOUNTER — Ambulatory Visit: Payer: Self-pay | Admitting: Physician Assistant

## 2023-07-13 ENCOUNTER — Other Ambulatory Visit: Payer: Self-pay | Admitting: Family Medicine

## 2023-07-13 ENCOUNTER — Telehealth: Payer: Self-pay | Admitting: Neurology

## 2023-07-13 ENCOUNTER — Ambulatory Visit: Payer: Self-pay

## 2023-07-13 ENCOUNTER — Other Ambulatory Visit: Payer: Self-pay

## 2023-07-13 DIAGNOSIS — I25110 Atherosclerotic heart disease of native coronary artery with unstable angina pectoris: Secondary | ICD-10-CM

## 2023-07-13 MED ORDER — CLONAZEPAM 1 MG PO TABS
ORAL_TABLET | ORAL | 3 refills | Status: DC
Start: 2023-07-13 — End: 2023-11-09

## 2023-07-13 MED ORDER — CLONAZEPAM 1 MG PO TABS: ORAL_TABLET | ORAL | 1 refills | Status: DC

## 2023-07-13 NOTE — Telephone Encounter (Signed)
 Patient called in stating he was seen in office yesterday and he was told that the Klonopin  1 mg would be called back in to pharmacy yesterday due to pharmacy stating they do not have this medication. Patient stated he checked in with the pharmacy again today and they do not have it. Patient states he is now completely out of this medication and it causes sleep disruption. Please advise.   Copied from CRM [id number]. Topic: Clinical - Prescription Issue >> Jul 13, 2023  9:38 AM Kevelyn M wrote: Reason for CRM: Patient's wife Ave Leisure is calling in because the patient has not 3 days due to him not having clonazePAM  (KLONOPIN ) 1 MG tablet. The prescription was denied a few times. There's been a miscommunication between [facility] and the Doctor's office.

## 2023-07-13 NOTE — Telephone Encounter (Signed)
 LVM and sent mychart msg informing pt of need to reschedule 07/18/23 appt - MD out

## 2023-07-14 ENCOUNTER — Telehealth: Admitting: Physician Assistant

## 2023-07-14 ENCOUNTER — Telehealth

## 2023-07-14 DIAGNOSIS — J205 Acute bronchitis due to respiratory syncytial virus: Secondary | ICD-10-CM

## 2023-07-14 MED ORDER — PREDNISONE 10 MG (21) PO TBPK: ORAL_TABLET | ORAL | 0 refills | Status: DC

## 2023-07-14 MED ORDER — PROMETHAZINE-DM 6.25-15 MG/5ML PO SYRP: 5.0000 mL | ORAL_SOLUTION | Freq: Four times a day (QID) | ORAL | 0 refills | Status: DC | PRN

## 2023-07-14 NOTE — Telephone Encounter (Signed)
 Please offer 8 AM visit next Thursday for Jacob Collier.

## 2023-07-14 NOTE — Progress Notes (Signed)
 Virtual Visit Consent   Jacob Collier, you are scheduled for a virtual visit with a [facility] provider today. Just as with appointments in the office, your consent must be obtained to participate. Your consent will be active for this visit and any virtual visit you may have with one of our providers in the next 365 days. If you have a MyChart account, a copy of this consent can be sent to you electronically.  As this is a virtual visit, video technology does not allow for your provider to perform a traditional examination. This may limit your provider's ability to fully assess your condition. If your provider identifies any concerns that need to be evaluated in person or the need to arrange testing (such as labs, EKG, etc.), we will make arrangements to do so. Although advances in technology are sophisticated, we cannot ensure that it will always work on either your end or our end. If the connection with a video visit is poor, the visit may have to be switched to a telephone visit. With either a video or telephone visit, we are not always able to ensure that we have a secure connection.  By engaging in this virtual visit, you consent to the provision of healthcare and authorize for your insurance to be billed (if applicable) for the services provided during this visit. Depending on your insurance coverage, you may receive a charge related to this service.  I need to obtain your verbal consent now. Are you willing to proceed with your visit today? Jacob Collier has provided verbal consent on 07/14/2023 for a virtual visit (video or telephone). Angelia Kelp, PA-C  Date: 07/14/2023 4:40 PM   Virtual Visit via Video Note   I, Angelia Kelp, connected with  Jacob Collier  ([id number], Jan 26, 1956) on 07/14/23 at  4:30 PM EDT by a video-enabled telemedicine application and verified that I am speaking with the correct person using two identifiers.  Location: Patient: Virtual Visit Location  Patient: Home Provider: Virtual Visit Location Provider: Home Office   I discussed the limitations of evaluation and management by telemedicine and the availability of in person appointments. The patient expressed understanding and agreed to proceed.    History of Present Illness: Jacob Collier is a 68 y.o. who identifies as a male who was assigned male at birth, and is being seen today for cough.  HPI: URI  This is a new problem. The current episode started in the past 7 days (4-5 days). The problem has been gradually worsening. There has been no fever. Associated symptoms include chest pain, congestion, coughing, headaches, sinus pain (last night but improving), a sore throat (initial, more from cough, improved slightly) and wheezing. Pertinent negatives include no diarrhea, ear pain, nausea, plugged ear sensation, rhinorrhea or vomiting.   Feels like when he had RSV before Reports bad coughing fit last night and coughed up a large glob of mucus and had relief, description sounds almost like a mucus plug. BP: 118/72   Problems:  Patient Active Problem List   Diagnosis Date Noted   Irritable bowel syndrome 07/12/2023   Diverticulitis 05/13/2023   Acute left-sided low back pain without sciatica 04/17/2023   Non-recurrent acute suppurative otitis media of left ear without spontaneous rupture of tympanic membrane 04/17/2023   Carpal tunnel syndrome of right wrist 03/20/2023   Thoracic radiculopathy 03/20/2023   Rib pain on left side 02/13/2023   Chronic left flank pain 02/13/2023   Shortness of breath 12/26/2022  History of pulmonary embolism 12/26/2022   Polyarticular arthritis 12/05/2022   Epigastric pain 11/23/2022   Hospital discharge follow-up 11/03/2022   Acute pulmonary embolism (HCC) 10/29/2022   Acute hypoxic respiratory failure (HCC) 10/29/2022   Elevated troponin 10/29/2022   GAD (generalized anxiety disorder) 10/29/2022   Acute abdominal pain in left upper quadrant  09/19/2022   History of partial colectomy 09/19/2022   Weakness of both lower extremities 08/05/2022   Elevated hemoglobin (HCC) 08/05/2022   Snores 08/05/2022   Daytime somnolence 08/05/2022   Cerumen impaction 07/27/2022   Vitamin B12 deficiency 07/27/2022   Other fatigue 04/25/2022   Vitamin D  deficiency 04/25/2022   Hyperlipidemia 04/04/2022   Joint pain 11/05/2021   Sacroiliitis (HCC) 07/13/2021   Neuritis of right foot 06/01/2021   Hyperglycemia 03/04/2021   Urethral stricture 01/21/2021   S/P CABG x 4 01/19/2021   Essential hypertension 12/08/2020   Paroxysmal supraventricular tachycardia (HCC) 11/26/2020   Lower respiratory tract infection due to COVID-19 virus 09/21/2020   Polycythemia 06/22/2020   Chronic arthralgias of knees and hips 05/19/2020   Myalgia due to statin 05/19/2020   Bronchopneumonia 02/03/2020   ED (erectile dysfunction) 10/22/2019   Sciatica without back pain, left 10/22/2019   History of total left hip replacement 10/10/2019   Primary insomnia 07/05/2019   Anxiety    Allergic rhinitis 05/16/2019   H/O heart artery stent 01/18/2019   CAD in native artery 01/18/2019   Progressive angina (HCC) 12/31/2018   Palpitations 12/31/2018   PVC's (premature ventricular contractions) 12/31/2018   Sigmoid stricture (HCC) 04/13/2018   Bradycardia 01/16/2017   Dyslipidemia 01/16/2017   Neck tightness 01/16/2017   Osteoarthritis of left hip 11/10/2015   Status post left hip replacement 11/10/2015   Diverticulosis 09/24/2014   Prostate CA (HCC) 09/24/2014   Chronic toe pain, right foot 09/24/2014   Neuritis or radiculitis due to rupture of lumbar intervertebral disc 06/12/2014   DOE (dyspnea on exertion) 06/06/2014   Murmur 06/06/2014   Spinal stenosis in cervical region 04/08/2014   Trochanteric bursitis 01/08/2014   Displacement of lumbar intervertebral disc without myelopathy 12/05/2013   Lumbar disc prolapse with compression radiculopathy 11/13/2013     Allergies:  Allergies  Allergen Reactions   Ranolazine Other (See Comments)    Chest discomfort/Reflux   Amoxicillin  Hives   Atorvastatin  Other (See Comments)    myalgias  Other reaction(s): Myalgias (intolerance)   Pravastatin  Other (See Comments)    myalgias  Other reaction(s): Myalgias (intolerance)   Rosuvastatin  Other (See Comments)    myalgias  Other reaction(s): Myalgias (intolerance)   Statins Other (See Comments)    myalgias   Eliquis  [Apixaban ] Rash and Other (See Comments)    Joint discomfort and elevated BP   Xarelto  [Rivaroxaban ] Rash    Joint discomfort, elevated BP   Medications:  Current Outpatient Medications:    predniSONE  (STERAPRED UNI-PAK 21 TAB) 10 MG (21) TBPK tablet, 6 day taper; take as directed on package instructions, Disp: 21 tablet, Rfl: 0   promethazine -dextromethorphan  (PROMETHAZINE -DM) 6.25-15 MG/5ML syrup, Take 5 mLs by mouth 4 (four) times daily as needed., Disp: 118 mL, Rfl: 0   amLODipine  (NORVASC ) 5 MG tablet, TAKE 1 TABLET BY MOUTH EVERYDAY AT BEDTIME, Disp: 90 tablet, Rfl: 3   aspirin  EC 81 MG tablet, Take 81 mg by mouth daily. Swallow whole., Disp: , Rfl:    clonazePAM  (KLONOPIN ) 1 MG tablet, TAKE 1 TABLET BY MOUTH TWICE A DAY AS NEEDED FOR ANXIETY, Disp: 60 tablet, Rfl: 3  clopidogrel  (PLAVIX ) 75 MG tablet, Take 1 tablet (75 mg total) by mouth daily., Disp: 90 tablet, Rfl: 3   doxycycline  (VIBRA -TABS) 100 MG tablet, Take 1 tablet (100 mg total) by mouth 2 (two) times daily., Disp: 20 tablet, Rfl: 0   Eszopiclone  3 MG TABS, TAKE 1 TABLET BY MOUTH EVERYDAY AT BEDTIME, Disp: 30 tablet, Rfl: 1   Evolocumab  (REPATHA  SURECLICK) 140 MG/ML SOAJ, INJECT 140 MG INTO THE SKIN EVERY 14 (FOURTEEN) DAYS., Disp: 6 mL, Rfl: 3   ezetimibe  (ZETIA ) 10 MG tablet, TAKE 1 TABLET BY MOUTH EVERY DAY, Disp: 90 tablet, Rfl: 2   linaclotide  (LINZESS ) 290 MCG CAPS capsule, Take 1 capsule (290 mcg total) by mouth daily before breakfast., Disp: 30 capsule, Rfl: 3    metoprolol  succinate (TOPROL -XL) 12.5 mg TB24 24 hr tablet, Take 12.5 mg by mouth daily., Disp: , Rfl:    Omega-3 Fatty Acids (OMEGA 3 PO), Take 500 mg by mouth every morning., Disp: , Rfl:    Phenylephrine  HCl (AFRIN ALLERGY NA), Place 2 sprays into the nose 2 (two) times daily., Disp: , Rfl:    Suvorexant  (BELSOMRA ) 5 MG TABS, Take 1 tablet (5 mg total) by mouth at bedtime as needed., Disp: 30 tablet, Rfl: 2  Observations/Objective: Patient is well-developed, well-nourished in no acute distress.  Resting comfortably at home.  Head is normocephalic, atraumatic.  No labored breathing.  Speech is clear and coherent with logical content.  Patient is alert and oriented at baseline.  Dry cough heard a few times, not affecting speech  Assessment and Plan: 1. Respiratory syncytial virus (RSV) as cause of acute bronchitis (Primary) - promethazine -dextromethorphan  (PROMETHAZINE -DM) 6.25-15 MG/5ML syrup; Take 5 mLs by mouth 4 (four) times daily as needed.  Dispense: 118 mL; Refill: 0 - predniSONE  (STERAPRED UNI-PAK 21 TAB) 10 MG (21) TBPK tablet; 6 day taper; take as directed on package instructions  Dispense: 21 tablet; Refill: 0  - Some improvement with residual cough - Will treat with Prednisone  6 day taper - Add Promethazine  DM for cough - Can continue Mucinex  (PLAIN) - Push fluids.  - Rest.  - Steam and humidifier can help - Seek in person evaluation if worsening or symptoms fail to improve    Follow Up Instructions: I discussed the assessment and treatment plan with the patient. The patient was provided an opportunity to ask questions and all were answered. The patient agreed with the plan and demonstrated an understanding of the instructions.  A copy of instructions were sent to the patient via MyChart unless otherwise noted below.    The patient was advised to call back or seek an in-person evaluation if the symptoms worsen or if the condition fails to improve as anticipated.     Angelia Kelp, PA-C

## 2023-07-14 NOTE — Patient Instructions (Signed)
 [name], thank you for joining Angelia Kelp, PA-C for today's virtual visit.  While this provider is not your primary care provider (PCP), if your PCP is located in our provider database this encounter information will be shared with them immediately following your visit.   A [facility] MyChart account gives you access to today's visit and all your visits, tests, and labs performed at [facility] " click here if you don't have a [facility] MyChart account or go to [email or web address]  Consent: (Patient) [name] provided verbal consent for this virtual visit at the beginning of the encounter.  Current Medications:  Current Outpatient Medications:    predniSONE  (STERAPRED UNI-PAK 21 TAB) 10 MG (21) TBPK tablet, 6 day taper; take as directed on package instructions, Disp: 21 tablet, Rfl: 0   promethazine -dextromethorphan  (PROMETHAZINE -DM) 6.25-15 MG/5ML syrup, Take 5 mLs by mouth 4 (four) times daily as needed., Disp: 118 mL, Rfl: 0   amLODipine  (NORVASC ) 5 MG tablet, TAKE 1 TABLET BY MOUTH EVERYDAY AT BEDTIME, Disp: 90 tablet, Rfl: 3   aspirin  EC 81 MG tablet, Take 81 mg by mouth daily. Swallow whole., Disp: , Rfl:    clonazePAM  (KLONOPIN ) 1 MG tablet, TAKE 1 TABLET BY MOUTH TWICE A DAY AS NEEDED FOR ANXIETY, Disp: 60 tablet, Rfl: 3   clopidogrel  (PLAVIX ) 75 MG tablet, Take 1 tablet (75 mg total) by mouth daily., Disp: 90 tablet, Rfl: 3   doxycycline  (VIBRA -TABS) 100 MG tablet, Take 1 tablet (100 mg total) by mouth 2 (two) times daily., Disp: 20 tablet, Rfl: 0   Eszopiclone  3 MG TABS, TAKE 1 TABLET BY MOUTH EVERYDAY AT BEDTIME, Disp: 30 tablet, Rfl: 1   Evolocumab  (REPATHA  SURECLICK) 140 MG/ML SOAJ, INJECT 140 MG INTO THE SKIN EVERY 14 (FOURTEEN) DAYS., Disp: 6 mL, Rfl: 3   ezetimibe  (ZETIA ) 10 MG tablet, TAKE 1 TABLET BY MOUTH EVERY DAY, Disp: 90 tablet, Rfl: 2   linaclotide  (LINZESS ) 290 MCG CAPS capsule, Take 1 capsule (290 mcg total) by mouth daily  before breakfast., Disp: 30 capsule, Rfl: 3   metoprolol  succinate (TOPROL -XL) 12.5 mg TB24 24 hr tablet, Take 12.5 mg by mouth daily., Disp: , Rfl:    Omega-3 Fatty Acids (OMEGA 3 PO), Take 500 mg by mouth every morning., Disp: , Rfl:    Phenylephrine  HCl (AFRIN ALLERGY NA), Place 2 sprays into the nose 2 (two) times daily., Disp: , Rfl:    Suvorexant  (BELSOMRA ) 5 MG TABS, Take 1 tablet (5 mg total) by mouth at bedtime as needed., Disp: 30 tablet, Rfl: 2   Medications ordered in this encounter:  Meds ordered this encounter  Medications   promethazine -dextromethorphan  (PROMETHAZINE -DM) 6.25-15 MG/5ML syrup    Sig: Take 5 mLs by mouth 4 (four) times daily as needed.    Dispense:  118 mL    Refill:  0    Supervising Provider:   Corine Dice [[id number]]   predniSONE  (STERAPRED UNI-PAK 21 TAB) 10 MG (21) TBPK tablet    Sig: 6 day taper; take as directed on package instructions    Dispense:  21 tablet    Refill:  0    Supervising Provider:   Corine Dice [[id number]]     *If you need refills on other medications prior to your next appointment, please contact your pharmacy*  Follow-Up: Call back or seek an in-person evaluation if the symptoms worsen or if the condition fails to improve as anticipated.  [facility] [phone]  Other Instructions Respiratory Syncytial Virus Infection, Adult Respiratory syncytial virus (RSV) infection is an infection caused by RSV, a common virus. This virus is similar to viruses that cause the common cold and the flu. RSV infection can affect the nose, throat, windpipe, and lungs (respiratory system). When the infection is severe, it can cause: Bronchiolitis. This condition causes inflammation of the air passages in the lungs (bronchioles). Pneumonia. This condition causes inflammation of the air sacs in the lungs. RSV infection spreads from person to person (is contagious) through droplets from coughs and sneezes (respiratory  secretions). This condition is rarely serious when it occurs in adults. What are the causes? This condition is caused by contact with RSV. This can happen by: Breathing respiratory secretions from someone who has the infection. Touching something that has been exposed to the virus (is contaminated) and then touching your mouth, nose, or eyes. Coming in close contact with someone who has this infection. This may happen if you: Hug or kiss. Shake or hold hands. Eat or drink using the same dishes or utensils. What increases the risk? The following factors may make you more likely to develop this condition: Being 69 years of age or older. Having certain health conditions, including: A long-term (chronic) lung condition, such as chronic obstructive pulmonary disease (COPD). An immune system that is weak. This is your body's defense system. Down syndrome. Heart disease. Working in a hospital or other health care facility. Living in a long-term health care facility. RSV infections are most common from the months of November to April, but they can happen any time of year. What are the signs or symptoms? Symptoms of this condition include: Having a runny nose. Coughing. You may have a cough that brings up mucus (productive cough). Sneezing. Having a fever. Wanting to eat less than usual. Breathing loudly (wheezing). Having shortness of breath. Having fluid build up in the lungs (respiratory distress). How is this diagnosed? This condition may be diagnosed based on: Your symptoms. Your medical history. A physical exam. A chest X-ray to rule out pneumonia. Blood tests or tests of mucus from your lungs (sputum). These tests may be done for older adults. A test of a sample of your respiratory secretions. How is this treated? In most cases, the RSV infection will go away after 1-2 weeks of caring for yourself at home.  Sometimes, RSV infection is severe and can cause bronchiolitis or  pneumonia. If you develop one or both of these conditions, you may need to be treated in the hospital. You may be given: Oxygen therapy. Antiviral medicine. Medicines to open your bronchioles (bronchodilators). Follow these instructions at home: Medicines Take over-the-counter and prescription medicines only as told by your health care provider. If you were prescribed an antiviral medicine, take it as told by your health care provider. Do not stop using the antiviral even if you start to feel better. Lifestyle  Eat a healthy diet. Do not drink alcohol. Do not use any products that contain nicotine or tobacco, such as cigarettes, e-cigarettes, and chewing tobacco. If you need help quitting, ask your health care provider. Rest at home until your symptoms go away. Return to your normal activities as told by your health care provider. Ask your health care provider what activities are safe for you. General instructions  Drink enough fluid to keep your urine pale yellow. Gargle with a salt-water mixture 3-4 times a day or as needed. To make a salt-water mixture, completely dissolve -1 tsp (3-6 g) of  salt in 1 cup (237 mL) of warm water. Keep all follow-up visits as told by your health care provider. This is important. How is this prevented? To prevent catching and spreading RSV: Wash your hands often with soap and water for at least 20 seconds. If soap and water are not available, use hand sanitizer. Do not touch your face without first cleaning your hands. Stay home if you have symptoms of the common cold or the flu. Cover your nose and mouth when you cough or sneeze. Avoid large groups of people. Keep a safe distance of about 6 feet (1.8 m) from people who are coughing or sneezing. Where to find more information Centers for Disease Control and Prevention: FootballExhibition.com.br Contact a health care provider if: Your symptoms get worse or have not changed after 2 weeks. You have: A fever. Hot  flashes, sweating, or chills that keep happening. A cough that brings up much more mucus than usual. A cough that brings up blood. You feel: Very tired (lethargic). Confused. Get help right away if: You have increased or severe trouble breathing. You lose consciousness. These symptoms may represent a serious problem that is an emergency. Do not wait to see if the symptoms will go away. Get medical help right away. Call your local emergency services (911 in the [place] Do not drive yourself to the hospital. Summary Respiratory syncytial virus (RSV) infection is an infection caused by RSV, a common virus. RSV infection can affect the nose, throat, windpipe, and lungs (respiratory system). When the infection is severe, it can cause bronchiolitis or pneumonia. Take over-the-counter and prescription medicines only as told by your health care provider. Contact a health care provider if your symptoms get worse or have not changed after 2 weeks. This information is not intended to replace advice given to you by your health care provider. Make sure you discuss any questions you have with your health care provider. Document Revised: 12/05/2018 Document Reviewed: 12/12/2018 Elsevier Patient Education  2022 [employer].   If you have been instructed to have an in-person evaluation today at a local Urgent Care facility, please use the link below. It will take you to a list of all of our available [facility], including address, phone number and hours of operation. Please do not delay care.  [facility]  If you or a family member do not have a primary care provider, use the link below to schedule a visit and establish care. When you choose a [facility] primary care physician or advanced practice provider, you gain a long-term partner in health. Find a Primary Care Provider  Learn more about [facility] in-office and virtual care options: [facility] - Get Care Now

## 2023-07-18 ENCOUNTER — Institutional Professional Consult (permissible substitution): Payer: Self-pay | Admitting: Neurology

## 2023-07-18 ENCOUNTER — Encounter: Payer: Self-pay | Admitting: Hematology and Oncology

## 2023-07-19 ENCOUNTER — Encounter: Payer: Self-pay | Admitting: Hematology and Oncology

## 2023-07-19 ENCOUNTER — Ambulatory Visit: Payer: Self-pay

## 2023-07-19 ENCOUNTER — Other Ambulatory Visit (HOSPITAL_COMMUNITY): Payer: Self-pay

## 2023-07-19 ENCOUNTER — Encounter: Payer: Self-pay | Admitting: Family Medicine

## 2023-07-19 ENCOUNTER — Ambulatory Visit: Admitting: Family Medicine

## 2023-07-19 ENCOUNTER — Telehealth: Payer: Self-pay | Admitting: Pharmacy Technician

## 2023-07-19 VITALS — BP 132/82 | HR 77 | Temp 98.3°F | Ht 71.0 in | Wt 229.0 lb

## 2023-07-19 DIAGNOSIS — J208 Acute bronchitis due to other specified organisms: Secondary | ICD-10-CM | POA: Diagnosis not present

## 2023-07-19 MED ORDER — TRIAMCINOLONE ACETONIDE 40 MG/ML IJ SUSP
80.0000 mg | Freq: Once | INTRAMUSCULAR | Status: AC
  Administered 2023-07-19: 80 mg via INTRAMUSCULAR

## 2023-07-19 MED ORDER — CEFTRIAXONE SODIUM 1 G IJ SOLR
1.0000 g | Freq: Once | INTRAMUSCULAR | Status: AC
  Administered 2023-07-19: 1 g via INTRAMUSCULAR

## 2023-07-19 MED ORDER — AZITHROMYCIN 250 MG PO TABS: ORAL_TABLET | ORAL | 0 refills | Status: AC

## 2023-07-19 MED ORDER — HYDROCOD POLI-CHLORPHE POLI ER 10-8 MG/5ML PO SUER: 5.0000 mL | Freq: Two times a day (BID) | ORAL | 0 refills | Status: DC | PRN

## 2023-07-19 NOTE — Telephone Encounter (Signed)
 Copied from CRM [id number]. Topic: Clinical - Red Word Triage >> Jul 19, 2023 11:08 AM Zipporah Him wrote: Red Word that prompted transfer to Nurse Triage: Patients oxygen level is low at 91, heart rate is high 82 BPM, usually sits around 66 BPM. Coughing up brown colored mucus. Severe coughing at night. Thinks maybe his current plan of action from the provider is not working properly.  Chief Complaint: cough with brown sputum, fatigue Symptoms: see above Frequency: constant Pertinent Negatives: Patient denies fever, cp Disposition: [] ED /[] Urgent Care (no appt availability in office) / [x] Appointment(In office/virtual)/ []  [facility]/ [] Home Care/ [] Refused Recommended Disposition /[] [facility]/ []  Follow-up with PCP Additional Notes: apt made for today per protocol; care advice given, denies questions; instructed to go to ER if becomes worse.   Reason for Disposition  [1] MILD difficulty breathing (e.g., minimal/no SOB at rest, SOB with walking, pulse <100) AND [2] still present when not coughing  Answer Assessment - Initial Assessment Questions 1. ONSET: "When did the cough begin?"      A week ago 2. SEVERITY: "How bad is the cough today?"      moderate 3. SPUTUM: "Describe the color of your sputum" (none, dry cough; clear, white, yellow, green)     brown 4. HEMOPTYSIS: "Are you coughing up any blood?" If so ask: "How much?" (flecks, streaks, tablespoons, etc.)     denies 5. DIFFICULTY BREATHING: "Are you having difficulty breathing?" If Yes, ask: "How bad is it?" (e.g., mild, moderate, severe)    - MILD: No SOB at rest, mild SOB with walking, speaks normally in sentences, can lie down, no retractions, pulse < 100.    - MODERATE: SOB at rest, SOB with minimal exertion and prefers to sit, cannot lie down flat, speaks in phrases, mild retractions, audible wheezing, pulse 100-120.    - SEVERE: Very SOB at rest, speaks in single words, struggling to breathe, sitting  hunched forward, retractions, pulse > 120      States fatigued 6. FEVER: "Do you have a fever?" If Yes, ask: "What is your temperature, how was it measured, and when did it start?"     no 7. CARDIAC HISTORY: "Do you have any history of heart disease?" (e.g., heart attack, congestive heart failure)      OHS, ablastin 8. LUNG HISTORY: "Do you have any history of lung disease?"  (e.g., pulmonary embolus, asthma, emphysema)     PE 9. PE RISK FACTORS: "Do you have a history of blood clots?" (or: recent major surgery, recent prolonged travel, bedridden)     yes 10. OTHER SYMPTOMS: "Do you have any other symptoms?" (e.g., runny nose, wheezing, chest pain)       no 11. PREGNANCY: "Is there any chance you are pregnant?" "When was your last menstrual period?"       na 12. TRAVEL: "Have you traveled out of the country in the last month?" (e.g., travel history, exposures)       no  Protocols used: Cough - Acute Productive-A-AH

## 2023-07-19 NOTE — Progress Notes (Signed)
 Acute Office Visit  Subjective:    Patient ID: Jacob Collier, male    DOB: Nov 22, 1955, 68 y.o.   MRN: [id number]  Chief Complaint  Patient presents with   URI    HPI: Patient is in today for URI. On 5/9 had video visit and presumptively treated for RSV as it felt so similar to previous episode a couple of years ago. States he feels some what better. Given and completed prednisone  and cough medication. Tells me last night he was up multiple times last night and even vomited and few times. Chest burns. No fever or chills.   Past Medical History:  Diagnosis Date   Anxiety    Arthritis    Coronary artery disease    a. S/p DES to RCA in 01/2019 b. s/p CABG x4 (LIMA-LAD, reverse SVG-PDA, reverse SVG-OM1, reverse SVG-1st Diag) in 01/2021   Diverticulosis    Dyspnea    Hard of hearing    Hyperlipidemia    Hypertension    hx of elevation on lyrica , off med and now normal    Paroxysmal SVT (supraventricular tachycardia) (HCC)    Post-op atrial fibrillation    Prostate cancer (HCC)    Wears glasses     Past Surgical History:  Procedure Laterality Date   ANTERIOR CERVICAL DECOMP/DISCECTOMY FUSION N/A 09/03/2021   Procedure: CERVICAL FIVE-SIX, CERVICAL SIX-SEVEN ANTERIOR CERVICAL DECOMPRESSION/DISCECTOMY FUSION;  Surgeon: Agustina Aldrich, MD;  Location: [facility] OR;  Service: Neurosurgery;  Laterality: N/A;   CARDIAC CATHETERIZATION     with 2 stents    COLECTOMY  05/2018   COLONOSCOPY     CORONARY ARTERY BYPASS GRAFT N/A 01/19/2021   Procedure: CORONARY ARTERY BYPASS GRAFTING (CABG), ON PUMP TIMES FOUR, USING LEFT INTERNAL MAMMARY ARTERY AND ENDOSOCPICALLY HARVESTED RIGHT GREATER SAPHENOUS VEIN;  Surgeon: Hilarie Lovely, MD;  Location: [facility] OR;  Service: Open Heart Surgery;  Laterality: N/A;   CYSTOSCOPY WITH URETHRAL DILATATION N/A 01/14/2021   Procedure: CYSTOSCOPY WITH BALLOON  DILATATION;  Surgeon: Erman Hayward, MD;  Location: [facility];  Service: Urology;  Laterality: N/A;   EYE  SURGERY  02/2020   bilaterl cataracts   IR THORACENTESIS ASP PLEURAL SPACE W/IMG GUIDE  02/02/2021   LEFT HEART CATH AND CORONARY ANGIOGRAPHY N/A 12/24/2020   Procedure: LEFT HEART CATH AND CORONARY ANGIOGRAPHY;  Surgeon: Swaziland, Peter M, MD;  Location: [facility] INVASIVE CV LAB;  Service: Cardiovascular;  Laterality: N/A;   PROSTATECTOMY     SVT ABLATION N/A 10/20/2022   Procedure: SVT ABLATION;  Surgeon: Efraim Grange, MD;  Location: [facility] INVASIVE CV LAB;  Service: Cardiovascular;  Laterality: N/A;   TEE WITHOUT CARDIOVERSION N/A 01/19/2021   Procedure: TRANSESOPHAGEAL ECHOCARDIOGRAM (TEE);  Surgeon: Hilarie Lovely, MD;  Location: [facility] OR;  Service: Open Heart Surgery;  Laterality: N/A;   TOTAL HIP ARTHROPLASTY Left 11/10/2015   Procedure: LEFT TOTAL HIP ARTHROPLASTY ANTERIOR APPROACH;  Surgeon: Arnie Lao, MD;  Location: [facility] OR;  Service: Orthopedics;  Laterality: Left;    Family History  Problem Relation Age of Onset   Alzheimer's disease Mother    Stroke Father    Diabetes Sister    Heart attack Maternal Grandmother    Cancer Maternal Uncle        prostate   Prostate cancer Maternal Uncle    Cancer Maternal Uncle        prostate   Prostate cancer Maternal Uncle    Colon polyps Brother    Colon cancer Neg Hx  Esophageal cancer Neg Hx    Rectal cancer Neg Hx    Stomach cancer Neg Hx     Social History   Socioeconomic History   Marital status: Married    Spouse name: Not on file   Number of children: 0   Years of education: Not on file   Highest education level: Not on file  Occupational History   Occupation: Employed at [employer]  Tobacco Use   Smoking status: Never   Smokeless tobacco: Never  Vaping Use   Vaping status: Never Used  Substance and Sexual Activity   Alcohol use: Not Currently    Alcohol/week: 0.0 standard drinks of alcohol    Comment: rarely   Drug use: No   Sexual activity: Yes    Partners: Female    Birth control/protection:  None  Other Topics Concern   Not on file  Social History Narrative   Not on file   Social Drivers of Health   Financial Resource Strain: Low Risk  (08/04/2022)   Overall Financial Resource Strain (CARDIA)    Difficulty of Paying Living Expenses: Not hard at all  Food Insecurity: No Food Insecurity (10/29/2022)   Hunger Vital Sign    Worried About Running Out of Food in the Last Year: Never true    Ran Out of Food in the Last Year: Never true  Transportation Needs: No Transportation Needs (10/29/2022)   PRAPARE - Administrator, Civil Service (Medical): No    Lack of Transportation (Non-Medical): No  Physical Activity: Sufficiently Active (08/04/2022)   Exercise Vital Sign    Days of Exercise per Week: 5 days    Minutes of Exercise per Session: 60 min  Stress: No Stress Concern Present (08/04/2022)   [employer] of Occupational Health - Occupational Stress Questionnaire    Feeling of Stress : Not at all  Social Connections: Moderately Isolated (08/04/2022)   Social Connection and Isolation Panel [NHANES]    Frequency of Communication with Friends and Family: More than three times a week    Frequency of Social Gatherings with Friends and Family: More than three times a week    Attends Religious Services: Never    Database administrator or Organizations: No    Attends Banker Meetings: Never    Marital Status: Married  Catering manager Violence: Not At Risk (10/29/2022)   Humiliation, Afraid, Rape, and Kick questionnaire    Fear of Current or Ex-Partner: No    Emotionally Abused: No    Physically Abused: No    Sexually Abused: No    Outpatient Medications Prior to Visit  Medication Sig Dispense Refill   amLODipine  (NORVASC ) 5 MG tablet TAKE 1 TABLET BY MOUTH EVERYDAY AT BEDTIME (Patient taking differently: Take 2.5-5 mg by mouth 2 (two) times daily. TAKE 1 TABLET QAM and 1/2 tab at QHS) 90 tablet 3   aspirin  EC 81 MG tablet Take 81 mg by mouth daily.  Swallow whole.     clonazePAM  (KLONOPIN ) 1 MG tablet TAKE 1 TABLET BY MOUTH TWICE A DAY AS NEEDED FOR ANXIETY 60 tablet 3   clopidogrel  (PLAVIX ) 75 MG tablet Take 1 tablet (75 mg total) by mouth daily. 90 tablet 3   doxycycline  (VIBRA -TABS) 100 MG tablet Take 1 tablet (100 mg total) by mouth 2 (two) times daily. 20 tablet 0   Eszopiclone  3 MG TABS TAKE 1 TABLET BY MOUTH EVERYDAY AT BEDTIME 30 tablet 1   Evolocumab  (REPATHA  SURECLICK) 140 MG/ML  SOAJ INJECT 140 MG INTO THE SKIN EVERY 14 (FOURTEEN) DAYS. 6 mL 3   ezetimibe  (ZETIA ) 10 MG tablet TAKE 1 TABLET BY MOUTH EVERY DAY 90 tablet 2   linaclotide  (LINZESS ) 290 MCG CAPS capsule Take 1 capsule (290 mcg total) by mouth daily before breakfast. 30 capsule 3   metoprolol  succinate (TOPROL -XL) 12.5 mg TB24 24 hr tablet Take 12.5 mg by mouth daily.     Omega-3 Fatty Acids (OMEGA 3 PO) Take 500 mg by mouth every morning.     Phenylephrine  HCl (AFRIN ALLERGY NA) Place 2 sprays into the nose 2 (two) times daily.     predniSONE  (STERAPRED UNI-PAK 21 TAB) 10 MG (21) TBPK tablet 6 day taper; take as directed on package instructions (Patient not taking: Reported on 07/19/2023) 21 tablet 0   promethazine -dextromethorphan  (PROMETHAZINE -DM) 6.25-15 MG/5ML syrup Take 5 mLs by mouth 4 (four) times daily as needed. 118 mL 0   Suvorexant  (BELSOMRA ) 5 MG TABS Take 1 tablet (5 mg total) by mouth at bedtime as needed. 30 tablet 2   No facility-administered medications prior to visit.    Allergies  Allergen Reactions   Ranolazine Other (See Comments)    Chest discomfort/Reflux   Amoxicillin  Hives   Atorvastatin  Other (See Comments)    myalgias  Other reaction(s): Myalgias (intolerance)   Pravastatin  Other (See Comments)    myalgias  Other reaction(s): Myalgias (intolerance)   Rosuvastatin  Other (See Comments)    myalgias  Other reaction(s): Myalgias (intolerance)   Statins Other (See Comments)    myalgias   Eliquis  [Apixaban ] Rash and Other (See  Comments)    Joint discomfort and elevated BP   Xarelto  [Rivaroxaban ] Rash    Joint discomfort, elevated BP    Review of Systems  Constitutional:  Positive for fatigue. Negative for chills and fever.  HENT:  Negative for congestion, ear pain, postnasal drip, rhinorrhea, sinus pressure, sinus pain and sore throat.   Respiratory:  Positive for cough. Negative for shortness of breath.   Cardiovascular:  Positive for chest pain (burns mid chest.).  Gastrointestinal:  Negative for diarrhea, nausea and vomiting.       Objective:         07/20/2023    8:12 AM 07/19/2023    1:35 PM 07/12/2023    3:47 PM  Vitals with BMI  Height 5\' 11"  5\' 11"  5\' 11"   Weight 231 lbs 229 lbs 217 lbs 13 oz  BMI 32.23 31.95 30.39  Systolic 158 132 829  Diastolic 76 82 68  Pulse 74 77 69    No data found.   Physical Exam Constitutional:      Appearance: Normal appearance.  HENT:     Right Ear: Tympanic membrane, ear canal and external ear normal.     Left Ear: Tympanic membrane, ear canal and external ear normal.     Nose: Nose normal. No congestion or rhinorrhea.     Mouth/Throat:     Mouth: Mucous membranes are moist.     Pharynx: No oropharyngeal exudate or posterior oropharyngeal erythema.  Cardiovascular:     Rate and Rhythm: Normal rate and regular rhythm.     Heart sounds: Normal heart sounds.  Pulmonary:     Effort: Pulmonary effort is normal. No respiratory distress.     Breath sounds: Rhonchi (occcasional) present. No wheezing or rales.  Lymphadenopathy:     Cervical: No cervical adenopathy.  Neurological:     Mental Status: He is alert and oriented to person, place,  and time.  Psychiatric:        Mood and Affect: Mood normal.        Behavior: Behavior normal.     Health Maintenance Due  Topic Date Due   Medicare Annual Wellness (AWV)  Never done   DTaP/Tdap/Td (1 - Tdap) Never done   Zoster Vaccines- Shingrix (1 of 2) Never done    There are no preventive care reminders to  display for this patient.   Lab Results  Component Value Date   TSH 1.570 04/04/2022   Lab Results  Component Value Date   WBC 10.1 06/07/2023   HGB 15.1 06/07/2023   HCT 46.7 06/07/2023   MCV 81.6 06/07/2023   PLT 227 06/07/2023   Lab Results  Component Value Date   NA 137 05/11/2023   K 4.7 05/11/2023   CO2 19 (L) 05/11/2023   GLUCOSE 88 05/11/2023   BUN 18 05/11/2023   CREATININE 1.12 05/11/2023   BILITOT 0.6 05/11/2023   ALKPHOS 107 05/11/2023   AST 23 05/11/2023   ALT 33 05/11/2023   PROT 7.3 05/11/2023   ALBUMIN  4.6 05/11/2023   CALCIUM  9.9 05/11/2023   ANIONGAP 7 01/13/2023   EGFR 71 05/11/2023   Lab Results  Component Value Date   CHOL 85 (L) 05/11/2023   Lab Results  Component Value Date   HDL 39 (L) 05/11/2023   Lab Results  Component Value Date   LDLCALC 25 05/11/2023   Lab Results  Component Value Date   TRIG 114 05/11/2023   Lab Results  Component Value Date   CHOLHDL 2.2 05/11/2023   Lab Results  Component Value Date   HGBA1C 6.0 (H) 05/11/2023       Assessment & Plan:  Acute bronchitis due to other specified organisms Assessment & Plan: Rocephin  and kenaog shots given.  Zpack sent.  Tussionex sent.   Orders: -     Triamcinolone  Acetonide -     cefTRIAXone  Sodium -     Hydrocod Poli-Chlorphe Poli ER; Take 5 mLs by mouth every 12 (twelve) hours as needed for cough.  Dispense: 115 mL; Refill: 0  Other orders -     Azithromycin ; Take 2 tablets on day 1, then 1 tablet daily on days 2 through 5  Dispense: 6 tablet; Refill: 0     Meds ordered this encounter  Medications   triamcinolone  acetonide (KENALOG -40) injection 80 mg   cefTRIAXone  (ROCEPHIN ) injection 1 g   chlorpheniramine-HYDROcodone  (TUSSIONEX) 10-8 MG/5ML    Sig: Take 5 mLs by mouth every 12 (twelve) hours as needed for cough.    Dispense:  115 mL    Refill:  0   azithromycin  (ZITHROMAX ) 250 MG tablet    Sig: Take 2 tablets on day 1, then 1 tablet daily on days 2  through 5    Dispense:  6 tablet    Refill:  0    No orders of the defined types were placed in this encounter.    Follow-up: No follow-ups on file.  An After Visit Summary was printed and given to the patient.  Mercy Stall, MD [facility] [phone]

## 2023-07-19 NOTE — Telephone Encounter (Signed)
 Pharmacy Patient Advocate Encounter   Received notification from CoverMyMeds that prior authorization for REPATHA  is required/requested.   Insurance verification completed.   The patient is insured through [facility] ADVANTAGE/RX ADVANCE .   Per test claim: PA required; PA submitted to above mentioned insurance via CoverMyMeds Key/confirmation #/EOC [id number] Status is pending   Cmm wouldn't let me put in labs and visit so I put in visit and faxed the labs

## 2023-07-20 ENCOUNTER — Ambulatory Visit: Payer: Self-pay | Admitting: Neurology

## 2023-07-20 ENCOUNTER — Encounter: Payer: Self-pay | Admitting: Neurology

## 2023-07-20 VITALS — BP 158/76 | HR 74 | Ht 71.0 in | Wt 231.0 lb

## 2023-07-20 DIAGNOSIS — F40240 Claustrophobia: Secondary | ICD-10-CM | POA: Insufficient documentation

## 2023-07-20 DIAGNOSIS — J22 Unspecified acute lower respiratory infection: Secondary | ICD-10-CM

## 2023-07-20 DIAGNOSIS — U071 COVID-19: Secondary | ICD-10-CM | POA: Diagnosis not present

## 2023-07-20 DIAGNOSIS — Z951 Presence of aortocoronary bypass graft: Secondary | ICD-10-CM | POA: Diagnosis not present

## 2023-07-20 DIAGNOSIS — R5383 Other fatigue: Secondary | ICD-10-CM | POA: Diagnosis not present

## 2023-07-20 DIAGNOSIS — D751 Secondary polycythemia: Secondary | ICD-10-CM | POA: Insufficient documentation

## 2023-07-20 DIAGNOSIS — R0609 Other forms of dyspnea: Secondary | ICD-10-CM | POA: Diagnosis not present

## 2023-07-20 DIAGNOSIS — I200 Unstable angina: Secondary | ICD-10-CM | POA: Diagnosis not present

## 2023-07-20 DIAGNOSIS — H903 Sensorineural hearing loss, bilateral: Secondary | ICD-10-CM | POA: Insufficient documentation

## 2023-07-20 NOTE — Patient Instructions (Signed)
 ASSESSMENT AND PLAN:    68 y.o. year old male  here with:  Polycythemia, CAD, and suspected severe OSA.      1) Jacob Collier visits us  today with a very eventful medical history over the last 5 years.  He was diagnosed with a polycythemia condition and genetic test did rule out a primary cause so further workup included a sleep study to rule out nocturnal hypoxemia.  This was a home sleep test and the patient spent about 15 minutes under 89% oxygen saturation but his nadir was 79% only present for 0.7 minutes.  I am not sure if his hypoxia measurements are correlated correctly to apneas because I miss in his home sleep test the usually expected bradycardia this was just not seen in clinic correlation to the apnea he slept mostly on the left and on the right side he varied during the night there was even about an hour of prone sleep noted but supine sleep was not seen.  Supine sleep usually exacerbates apneas.     Also there was not a significant number of central apneas only 2 in comparison to 93 obstructive events and 134 hypopneas.  He had a series of 3 nocturnal home sleep tests 1 time his AHI was 31.9 this was the highest 1 time 26.9 and he was then further evaluated by pulmonary function tests.  The spirometry was interpreted as normal.  The measured volume was 101 L/min.  He also had a exercise pulse oximetry and here his breaths per minute remained under 85 and his oxygen level remained over 95.  These tests were performed before the patient developed a pulmonary embolism.   The patient is also aware that testosterone  and steroids can increase the red blood cell count and the H&H.   PLAN :   I fully agree that the patient should be treated with positive airway pressure but he has a underlying condition of claustrophobia as a form of anxiety disorder and could not tolerate the interface.  He also felt that the pressure was too high but I believe that was a mask that would be tolerable to him this  sensation would also disappear.     I do not see an alternative by dental device or by hypoglossal nerve stimulator for the patient with a secondary polycythemia it would just not provide the oxygen that the positive airway pressure therapy will give him.    So my goal would be to invite him to a in lab sleep study - I am okay with using a sleep aid so that he can sleep.  And my goal is to see 2 hours of sleep before we start a CPAP titration here in the lab.  The patient is already on Klonopin  at home and I would like for him to bring his established prescription to the sleep lab so that we can make sure that he can fall asleep.  I would also ask the attending sleep technician to try interfaces with the patient before he goes to sleep so we do not have to interrupt his sleep in the middle of the night for fitting.  I am pretty optimistic that we will find apnea but I need to know more about the correlating hypoxemia and if the patient may need nocturnal oxygen in addition to positive airway pressure.     PLAN SLPIT study on Klonopin  ( his established at home anxiety and sleep aid) , S{LIT at 20/h and  uses afrin at night.  I plan to follow up either personally or through our NP within 3-5 months.

## 2023-07-20 NOTE — Progress Notes (Signed)
 SLEEP MEDICINE CLINIC    Provider:  Neomia Banner, MD  Primary Care Physician:  Jacob Stall, MD [address] [place] [address]     Referring Provider: Mercy Stall, Md [address] [place],  [place] [address]          Chief Complaint according to patient   Patient presents with:     New Patient (Initial Visit)           HISTORY OF PRESENT ILLNESS:  Jacob Collier is a 68 y.o. male patient who is seen upon referral on 07/20/2023 from Jacob Collier and Oncologist Jacob Collier for a Sleep evaluation.   Chief concern according to patient :  " I couldn't use CPAP- but I don't trust the Sleep test either'.   I have the pleasure of seeing Jacob Collier 07/20/23 a right-handed and severely hearing impaired pleasant  Caucasian  male with a possible sleep disorder.   Had prostrate cancer since 2015 and radiation treatment . CABG 01-2021.  Has secondary polycythemia, high RBC since 07-05-2021.  First time documented elevated H and  08-26-2021 . Had a HST 08-2022 to look at causes for polycythemia and  was diagnosed with an AHI of 31.9/h. Jacob Collier  Had anterior fusion C 6-7 in June 2024.   The patient has a history of arrhythmia, atrial fibrillation, RVR  since my 50's, had no cardioversion, and then ablation 10-20-2022.  No pacemaker needed.  He developed angina,SOB, and a fever after the procedure . He had developed a Pulmonary embolism after the procedure catheter was removed and was on anticoagulation for  6 month.    He reportedly has no embolism signs on CT scan/ X ray anymore. He has anxiety and panic attacks and couldn't tolerate CPAP interface.  His HBa1c has risen to 6 , -under steroids ?Jacob Collier  Steroids used to treat post RS-Viral bronchitis.  He has also gotten steroid injections for spine and joint pain.  He is also on testosterone .     Family medical /sleep history: no other family member on CPAP with OSA, insomnia, sleep walkers.  older brother is  6 years older,  affected by DM type 1, brain aneurysm, PAD, cognitive decline.     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 He was a Electronics engineer.  Patient is retired from that and lives in a household with spouse. Family status is married, with one stepchild. Pets: one dog.  Tobacco use: none -.  ETOH use : seldomly- 1 beer a months.   Caffeine intake in form of Coffee( /) Soda( Mt Dew ) Tea ( /) no energy drinks Exercise in form of - no current routine .   Hobbies :dog       Sleep habits are as follows: The patient's dinner time is between 6-7 PM. The patient goes to bed at 10 PM since retirement and continues to sleep for 6 hours,  does  wake for  bathroom breaks, is incontinent.  The preferred sleep position is laterally , with the support of 2-4 pillows due to neck discomfort .  Dreams are reportedly frequent/vivid.  The patient wakes up spontaneously 4-5 AM  bu can go back to sleep and rises  now at 7  AM . He  reports not feeling refreshed or restored in AM, with symptoms with neck pain, stiffness, and residual fatigue.  Naps are not  taken - it interferes with nocturnal sleep.   Review of Systems: Out of a complete 14 system review, the patient complains of only the following symptoms, and all other reviewed systems are negative.:  Fatigue, sleepiness , snoring,  Nocturia - incontinence  Anxiety on klonopin , scopolamine    How likely are you to doze in the following situations: 0 = not likely, 1 = slight chance, 2 = moderate chance, 3 = high chance   Sitting and Reading? Watching Television? Sitting inactive in a public place (theater or meeting)? As a passenger in a car for an hour without a break? Lying down in the afternoon when circumstances permit? Sitting and talking to someone? Sitting quietly after lunch without alcohol? In a car, while stopped for a few minutes in traffic?   Total = 8/ 24 points    FSS endorsed at 32/ 63 points.  GDS 2/ 15  but high anxiety, claustrophobia.   Social History   Socioeconomic History   Marital status: Married    Spouse name: Not on file   Number of children: 0   Years of education: Not on file   Highest education level: Not on file  Occupational History   Occupation: Employed at [employer]  Tobacco Use   Smoking status: Never   Smokeless tobacco: Never  Vaping Use   Vaping status: Never Used  Substance and Sexual Activity   Alcohol use: Not Currently    Alcohol/week: 0.0 standard drinks of alcohol    Comment: rarely   Drug use: No   Sexual activity: Yes    Partners: Female    Birth control/protection: None  Other Topics Concern   Not on file  Social History Narrative   Not on file   Social Drivers of Health   Financial Resource Strain: Low Risk  (08/04/2022)   Overall Financial Resource Strain (CARDIA)    Difficulty of Paying Living Expenses: Not hard at all  Food Insecurity: No Food Insecurity (10/29/2022)   Hunger Vital Sign    Worried About Running Out of Food in the Last Year: Never true    Ran Out of Food in the Last Year: Never true  Transportation Needs: No Transportation Needs (10/29/2022)   PRAPARE - Administrator, Civil Service (Medical): No    Lack of Transportation (Non-Medical): No  Physical Activity: Sufficiently Active (08/04/2022)   Exercise Vital Sign    Days of Exercise per Week: 5 days    Minutes of Exercise per Session: 60 min  Stress: No Stress Concern Present (08/04/2022)   [employer] of Occupational Health - Occupational Stress Questionnaire    Feeling of Stress : Not at all  Social Connections: Moderately Isolated (08/04/2022)   Social Connection and Isolation Panel [NHANES]    Frequency of Communication with Friends and Family: More than three times a week    Frequency of Social Gatherings with Friends and Family: More than three times a week    Attends Religious Services: Never     Database administrator or Organizations: No    Attends Engineer, structural: Never    Marital Status: Married    Family History  Problem Relation Age of Onset   Alzheimer's disease Mother    Stroke Father    Diabetes Sister    Heart attack Maternal Grandmother    Cancer Maternal Uncle        prostate   Prostate cancer Maternal Uncle  Cancer Maternal Uncle        prostate   Prostate cancer Maternal Uncle    Colon polyps Brother    Colon cancer Neg Hx    Esophageal cancer Neg Hx    Rectal cancer Neg Hx    Stomach cancer Neg Hx     Past Medical History:  Diagnosis Date   Anxiety    Arthritis    Coronary artery disease    a. S/p DES to RCA in 01/2019 b. s/p CABG x4 (LIMA-LAD, reverse SVG-PDA, reverse SVG-OM1, reverse SVG-1st Diag) in 01/2021   Diverticulosis    Dyspnea    Hard of hearing    Hyperlipidemia    Hypertension    hx of elevation on lyrica , off med and now normal    Paroxysmal SVT (supraventricular tachycardia) (HCC)    Post-op atrial fibrillation    Prostate cancer (HCC)    Wears glasses     Past Surgical History:  Procedure Laterality Date   ANTERIOR CERVICAL DECOMP/DISCECTOMY FUSION N/A 09/03/2021   Procedure: CERVICAL FIVE-SIX, CERVICAL SIX-SEVEN ANTERIOR CERVICAL DECOMPRESSION/DISCECTOMY FUSION;  Surgeon: Agustina Aldrich, MD;  Location: [facility] OR;  Service: Neurosurgery;  Laterality: N/A;   CARDIAC CATHETERIZATION     with 2 stents    COLECTOMY  05/2018   COLONOSCOPY     CORONARY ARTERY BYPASS GRAFT N/A 01/19/2021   Procedure: CORONARY ARTERY BYPASS GRAFTING (CABG), ON PUMP TIMES FOUR, USING LEFT INTERNAL MAMMARY ARTERY AND ENDOSOCPICALLY HARVESTED RIGHT GREATER SAPHENOUS VEIN;  Surgeon: Hilarie Lovely, MD;  Location: [facility] OR;  Service: Open Heart Surgery;  Laterality: N/A;   CYSTOSCOPY WITH URETHRAL DILATATION N/A 01/14/2021   Procedure: CYSTOSCOPY WITH BALLOON  DILATATION;  Surgeon: Erman Hayward, MD;  Location: [facility] ORS;  Service: Urology;   Laterality: N/A;   EYE SURGERY  02/2020   bilaterl cataracts   IR THORACENTESIS ASP PLEURAL SPACE W/IMG GUIDE  02/02/2021   LEFT HEART CATH AND CORONARY ANGIOGRAPHY N/A 12/24/2020   Procedure: LEFT HEART CATH AND CORONARY ANGIOGRAPHY;  Surgeon: Swaziland, Peter M, MD;  Location: [facility] INVASIVE CV LAB;  Service: Cardiovascular;  Laterality: N/A;   PROSTATECTOMY     SVT ABLATION N/A 10/20/2022   Procedure: SVT ABLATION;  Surgeon: Efraim Grange, MD;  Location: [facility] INVASIVE CV LAB;  Service: Cardiovascular;  Laterality: N/A;   TEE WITHOUT CARDIOVERSION N/A 01/19/2021   Procedure: TRANSESOPHAGEAL ECHOCARDIOGRAM (TEE);  Surgeon: Hilarie Lovely, MD;  Location: [facility] OR;  Service: Open Heart Surgery;  Laterality: N/A;   TOTAL HIP ARTHROPLASTY Left 11/10/2015   Procedure: LEFT TOTAL HIP ARTHROPLASTY ANTERIOR APPROACH;  Surgeon: Arnie Lao, MD;  Location: [facility] OR;  Service: Orthopedics;  Laterality: Left;     Current Outpatient Medications on File Prior to Visit  Medication Sig Dispense Refill   amLODipine  (NORVASC ) 5 MG tablet TAKE 1 TABLET BY MOUTH EVERYDAY AT BEDTIME (Patient taking differently: Take 2.5-5 mg by mouth 2 (two) times daily. TAKE 1 TABLET QAM and 1/2 tab at QHS) 90 tablet 3   aspirin  EC 81 MG tablet Take 81 mg by mouth daily. Swallow whole.     azithromycin  (ZITHROMAX ) 250 MG tablet Take 2 tablets on day 1, then 1 tablet daily on days 2 through 5 6 tablet 0   chlorpheniramine-HYDROcodone  (TUSSIONEX) 10-8 MG/5ML Take 5 mLs by mouth every 12 (twelve) hours as needed for cough. 115 mL 0   clonazePAM  (KLONOPIN ) 1 MG tablet TAKE 1 TABLET BY MOUTH TWICE A DAY AS NEEDED FOR ANXIETY  60 tablet 3   clopidogrel  (PLAVIX ) 75 MG tablet Take 1 tablet (75 mg total) by mouth daily. 90 tablet 3   doxycycline  (VIBRA -TABS) 100 MG tablet Take 1 tablet (100 mg total) by mouth 2 (two) times daily. 20 tablet 0   Eszopiclone  3 MG TABS TAKE 1 TABLET BY MOUTH EVERYDAY AT BEDTIME 30 tablet 1    Evolocumab  (REPATHA  SURECLICK) 140 MG/ML SOAJ INJECT 140 MG INTO THE SKIN EVERY 14 (FOURTEEN) DAYS. 6 mL 3   ezetimibe  (ZETIA ) 10 MG tablet TAKE 1 TABLET BY MOUTH EVERY DAY 90 tablet 2   linaclotide  (LINZESS ) 290 MCG CAPS capsule Take 1 capsule (290 mcg total) by mouth daily before breakfast. 30 capsule 3   metoprolol  succinate (TOPROL -XL) 12.5 mg TB24 24 hr tablet Take 12.5 mg by mouth daily.     Omega-3 Fatty Acids (OMEGA 3 PO) Take 500 mg by mouth every morning.     Phenylephrine  HCl (AFRIN ALLERGY NA) Place 2 sprays into the nose 2 (two) times daily.     No current facility-administered medications on file prior to visit.    Allergies  Allergen Reactions   Ranolazine Other (See Comments)    Chest discomfort/Reflux   Amoxicillin  Hives   Atorvastatin  Other (See Comments)    myalgias  Other reaction(s): Myalgias (intolerance)   Pravastatin  Other (See Comments)    myalgias  Other reaction(s): Myalgias (intolerance)   Rosuvastatin  Other (See Comments)    myalgias  Other reaction(s): Myalgias (intolerance)   Statins Other (See Comments)    myalgias   Eliquis  [Apixaban ] Rash and Other (See Comments)    Joint discomfort and elevated BP   Xarelto  [Rivaroxaban ] Rash    Joint discomfort, elevated BP     DIAGNOSTIC DATA (LABS, IMAGING, TESTING) - I reviewed patient records, labs, notes, testing and imaging myself where available.  Lab Results  Component Value Date   WBC 10.1 06/07/2023   HGB 15.1 06/07/2023   HCT 46.7 06/07/2023   MCV 81.6 06/07/2023   PLT 227 06/07/2023      Component Value Date/Time   NA 137 05/11/2023 1646   K 4.7 05/11/2023 1646   CL 104 05/11/2023 1646   CO2 19 (L) 05/11/2023 1646   GLUCOSE 88 05/11/2023 1646   GLUCOSE 87 01/13/2023 1455   BUN 18 05/11/2023 1646   CREATININE 1.12 05/11/2023 1646   CREATININE 1.19 01/13/2023 1455   CALCIUM  9.9 05/11/2023 1646   PROT 7.3 05/11/2023 1646   ALBUMIN  4.6 05/11/2023 1646   AST 23 05/11/2023 1646    AST 23 01/13/2023 1455   ALT 33 05/11/2023 1646   ALT 20 01/13/2023 1455   ALKPHOS 107 05/11/2023 1646   BILITOT 0.6 05/11/2023 1646   BILITOT 0.8 01/13/2023 1455   GFRNONAA >60 01/13/2023 1455   GFRAA 73 04/01/2020 1519   Lab Results  Component Value Date   CHOL 85 (L) 05/11/2023   HDL 39 (L) 05/11/2023   LDLCALC 25 05/11/2023   TRIG 114 05/11/2023   CHOLHDL 2.2 05/11/2023   Lab Results  Component Value Date   HGBA1C 6.0 (H) 05/11/2023   Lab Results  Component Value Date   VITAMINB12 691 11/23/2022   Lab Results  Component Value Date   TSH 1.570 04/04/2022    PHYSICAL EXAM:  Today's Vitals   07/20/23 0812  BP: (!) 158/76  Pulse: 74  Weight: 231 lb (104.8 kg)  Height: 5\' 11"  (1.803 m)   Body mass index is 32.22 kg/m.  Wt Readings from Last 3 Encounters:  07/20/23 231 lb (104.8 kg)  07/19/23 229 lb (103.9 kg)  07/12/23 217 lb 12.8 oz (98.8 kg)     Ht Readings from Last 3 Encounters:  07/20/23 5\' 11"  (1.803 m)  07/19/23 5\' 11"  (1.803 m)  07/12/23 5\' 11"  (1.803 m)      General: The patient is awake, alert and appears not in acute distress. The patient is well groomed. Head: Normocephalic, atraumatic. Neck is supple.  Mallampati 3 plus  ( now 4 ) ,  neck circumference:19 inches . Nasal airflow patent.   Retrognathia is seen.  Dental status: Biological teeth- no overbite, facial hair.  Cardiovascular:  Regular rate and cardiac rhythm by pulse,  with left sided distended neck veins. Respiratory: Lungs are clear to auscultation.  Skin:  Without evidence of ankle edema, or rash. Trunk: The patient's posture is erect.   NEUROLOGIC EXAM: The patient is awake and alert, oriented to place and time.   Memory subjective described as intact.  Attention span & concentration ability appears normal.  Speech is fluent,  without dysarthria or aphasia.  There is hoarseness, dysphonia.  Mood and affect are appropriate.   Cranial nerves: no loss of smell or taste  reported  Pupils are equal and briskly reactive to light. Funduscopic exam deferred .  Extraocular movements in vertical and horizontal planes were intact and without nystagmus. No Diplopia. Visual fields by finger perimetry are intact. Hearing was impaired, bone conduction is better on the left side.     Facial sensation intact to fine touch.  Facial motor strength is symmetric and tongue and uvula move midline.  Neck ROM : rotation, tilt and flexion extension were normal for age and shoulder shrug was symmetrical.    Motor exam:  Symmetric bulk, tone and ROM.   Normal tone without cog -wheeling, symmetrical weakness in  grip strength.   Sensory:   vibration was intact  Proprioception tested in the upper extremities was normal.   Coordination: Rapid alternating movements in the fingers/hands were of normal speed.  The Finger-to-nose maneuver was intact without evidence of ataxia, dysmetria and mild action  tremor.  Pronatordrift left more than right.    Gait and station: Patient could rise unassisted from a seated position, walked without assistive device.  Stance is of normal width/ base and the patient turned with 3 steps.  Toe and heel walk were deferred.  Deep tendon reflexes: in the  upper and lower extremities are attenuated.      ASSESSMENT AND PLAN:   68 y.o. year old male  here with:    1) Mr. Watwood visits us  today with a very eventful medical history over the last 5 years.  He was diagnosed with a polycythemia condition and genetic test did rule out a primary cause so further workup included a sleep study to rule out nocturnal hypoxemia.  This was a home sleep test and the patient spent about 15 minutes under 89% oxygen saturation but his nadir was 79% only present for 0.7 minutes.  I am not sure if his hypoxia measurements are correlated correctly to apneas because I miss in his home sleep test the usually expected bradycardia this was just not seen in clinic correlation to  the apnea he slept mostly on the left and on the right side he varied during the night there was even about an hour of prone sleep noted but supine sleep was not seen.  Supine sleep usually exacerbates apneas.  Also there was not a significant number of central apneas only 2 in comparison to 93 obstructive events and 134 hypopneas.  He had a series of 3 nocturnal home sleep tests 1 time his AHI was 31.9 this was the highest 1 time 26.9 and he was then further evaluated by pulmonary function tests.  The spirometry was interpreted as normal.  The measured volume was 101 L/min.  He also had a exercise pulse oximetry and here his breaths per minute remained under 85 and his oxygen level remained over 95.  These tests were performed before the patient developed a pulmonary embolism.  The patient is also aware that testosterone  and steroids can increase the red blood cell count and the H&H.   I fully agree that the patient should be treated with positive airway pressure but he has a underlying condition of claustrophobia as a form of anxiety disorder and could not tolerate the interface.  He also felt that the pressure was too high but I believe that was a mask that would be tolerable to him this sensation would also disappear.    I do not see an alternative by dental device or by hypoglossal nerve stimulator for the patient with a secondary polycythemia it would just not provide the oxygen that the positive airway pressure therapy will give him.  So my goal would be to invite him to a in lab sleep study I am okay with using a sleep aid so that he can sleep.  And my goal is to see 2 hours of sleep before we start a CPAP titration here in the lab.  The patient is already on Klonopin  at home and I would like for him to bring his established prescription to the sleep lab so that we can make sure that he can fall asleep.  I would also ask the attending sleep technician to try interfaces with the patient before he  goes to sleep so we do not have to interrupt his sleep in the middle of the night for fitting.  I am pretty optimistic that we will find apnea but I need to know more about the correlating hypoxemia and if the patient may need nocturnal oxygen in addition to positive airway pressure.   PLAN SLPIT study on Klonopin  ( his established at home anxiety and sleep aid) , S{LIT at 20/h and  uses afrin at night.   I plan to follow up either personally or through our NP within 3-5 months.   I would like to thank Jacob Stall, MD and [facility] for allowing me to meet with and to take care of this pleasant patient.     After spending a total time of  55  minutes face to face and additional time for physical and neurologic examination, review of laboratory studies,  personal review of imaging studies, reports and results of other testing and review of referral information / records as far as provided in visit,   Electronically signed by: Jacob Banner, MD 07/20/2023 8:31 AM  [facility] Board certified by The [employer] of Sleep Medicine and Diplomate of the [employer] of Sleep Medicine. Board certified In Neurology through the ABPN, Fellow of the [employer] of Neurology.

## 2023-07-20 NOTE — Telephone Encounter (Signed)
 Pharmacy Patient Advocate Encounter  Received notification from [employer] that Prior Authorization for REPATHA  has been APPROVED from 07/19/23 to 01/19/24. Spoke to pharmacy to process.Copay is $45.00 for 90 days.    PA #/Case ID/Reference #: [id number]

## 2023-07-23 NOTE — Assessment & Plan Note (Signed)
 Rocephin  and kenaog shots given.  Zpack sent.  Tussionex sent.

## 2023-07-25 ENCOUNTER — Telehealth: Payer: Self-pay | Admitting: Neurology

## 2023-07-25 NOTE — Telephone Encounter (Signed)
 Split HTA pending

## 2023-08-02 DIAGNOSIS — L304 Erythema intertrigo: Secondary | ICD-10-CM | POA: Diagnosis not present

## 2023-08-02 DIAGNOSIS — L300 Nummular dermatitis: Secondary | ICD-10-CM | POA: Diagnosis not present

## 2023-08-02 DIAGNOSIS — L821 Other seborrheic keratosis: Secondary | ICD-10-CM | POA: Diagnosis not present

## 2023-08-02 DIAGNOSIS — D485 Neoplasm of uncertain behavior of skin: Secondary | ICD-10-CM | POA: Diagnosis not present

## 2023-08-07 ENCOUNTER — Emergency Department (HOSPITAL_COMMUNITY)
Admission: EM | Admit: 2023-08-07 | Discharge: 2023-08-07 | Disposition: A | Discharge status: Home / Self Care | Attending: Emergency Medicine | Admitting: Emergency Medicine

## 2023-08-07 ENCOUNTER — Other Ambulatory Visit: Payer: Self-pay

## 2023-08-07 ENCOUNTER — Ambulatory Visit: Payer: Self-pay

## 2023-08-07 ENCOUNTER — Emergency Department (HOSPITAL_COMMUNITY)

## 2023-08-07 ENCOUNTER — Encounter: Payer: Self-pay | Admitting: Physician Assistant

## 2023-08-07 ENCOUNTER — Encounter (HOSPITAL_COMMUNITY): Payer: Self-pay

## 2023-08-07 ENCOUNTER — Ambulatory Visit: Admitting: Physician Assistant

## 2023-08-07 ENCOUNTER — Other Ambulatory Visit (HOSPITAL_COMMUNITY): Payer: Self-pay

## 2023-08-07 VITALS — BP 142/82 | HR 94 | Temp 97.8°F | Ht 71.0 in | Wt 216.0 lb

## 2023-08-07 DIAGNOSIS — I480 Paroxysmal atrial fibrillation: Secondary | ICD-10-CM | POA: Insufficient documentation

## 2023-08-07 DIAGNOSIS — D6869 Other thrombophilia: Secondary | ICD-10-CM

## 2023-08-07 DIAGNOSIS — J418 Mixed simple and mucopurulent chronic bronchitis: Secondary | ICD-10-CM | POA: Insufficient documentation

## 2023-08-07 DIAGNOSIS — Z7982 Long term (current) use of aspirin: Secondary | ICD-10-CM | POA: Diagnosis not present

## 2023-08-07 DIAGNOSIS — R42 Dizziness and giddiness: Secondary | ICD-10-CM

## 2023-08-07 DIAGNOSIS — R0602 Shortness of breath: Secondary | ICD-10-CM

## 2023-08-07 DIAGNOSIS — Z7902 Long term (current) use of antithrombotics/antiplatelets: Secondary | ICD-10-CM | POA: Diagnosis not present

## 2023-08-07 DIAGNOSIS — Z8546 Personal history of malignant neoplasm of prostate: Secondary | ICD-10-CM | POA: Insufficient documentation

## 2023-08-07 DIAGNOSIS — Z79899 Other long term (current) drug therapy: Secondary | ICD-10-CM | POA: Diagnosis not present

## 2023-08-07 DIAGNOSIS — I4891 Unspecified atrial fibrillation: Secondary | ICD-10-CM | POA: Insufficient documentation

## 2023-08-07 DIAGNOSIS — R55 Syncope and collapse: Secondary | ICD-10-CM | POA: Diagnosis not present

## 2023-08-07 DIAGNOSIS — I10 Essential (primary) hypertension: Secondary | ICD-10-CM | POA: Insufficient documentation

## 2023-08-07 DIAGNOSIS — I483 Typical atrial flutter: Secondary | ICD-10-CM

## 2023-08-07 DIAGNOSIS — I2510 Atherosclerotic heart disease of native coronary artery without angina pectoris: Secondary | ICD-10-CM | POA: Insufficient documentation

## 2023-08-07 DIAGNOSIS — Z951 Presence of aortocoronary bypass graft: Secondary | ICD-10-CM | POA: Diagnosis not present

## 2023-08-07 DIAGNOSIS — I4892 Unspecified atrial flutter: Secondary | ICD-10-CM | POA: Diagnosis not present

## 2023-08-07 DIAGNOSIS — Z789 Other specified health status: Secondary | ICD-10-CM | POA: Diagnosis not present

## 2023-08-07 DIAGNOSIS — Z981 Arthrodesis status: Secondary | ICD-10-CM | POA: Diagnosis not present

## 2023-08-07 DIAGNOSIS — R002 Palpitations: Secondary | ICD-10-CM | POA: Diagnosis not present

## 2023-08-07 DIAGNOSIS — E785 Hyperlipidemia, unspecified: Secondary | ICD-10-CM

## 2023-08-07 DIAGNOSIS — M05741 Rheumatoid arthritis with rheumatoid factor of right hand without organ or systems involvement: Secondary | ICD-10-CM | POA: Insufficient documentation

## 2023-08-07 DIAGNOSIS — F5101 Primary insomnia: Secondary | ICD-10-CM

## 2023-08-07 LAB — BASIC METABOLIC PANEL WITH GFR
Anion gap: 13 (ref 5–15)
BUN: 15 mg/dL (ref 8–23)
CO2: 20 mmol/L — ABNORMAL LOW (ref 22–32)
Calcium: 9.2 mg/dL (ref 8.9–10.3)
Chloride: 106 mmol/L (ref 98–111)
Creatinine, Ser: 1.22 mg/dL (ref 0.61–1.24)
GFR, Estimated: >60 mL/min (ref >60)
Glucose, Bld: 113 mg/dL — ABNORMAL HIGH (ref 70–99)
Potassium: 4.3 mmol/L (ref 3.5–5.1)
Sodium: 139 mmol/L (ref 135–145)

## 2023-08-07 LAB — TROPONIN I (HIGH SENSITIVITY)
Troponin I (High Sensitivity): 14 ng/L (ref <18)
Troponin I (High Sensitivity): 15 ng/L (ref <18)

## 2023-08-07 LAB — CBC
HCT: 54.5 % — ABNORMAL HIGH (ref 39.0–52.0)
Hemoglobin: 17.2 g/dL — ABNORMAL HIGH (ref 13.0–17.0)
MCH: 26 pg (ref 26.0–34.0)
MCHC: 31.6 g/dL (ref 30.0–36.0)
MCV: 82.3 fL (ref 80.0–100.0)
Platelets: 242 10*3/uL (ref 150–400)
RBC: 6.62 MIL/uL — ABNORMAL HIGH (ref 4.22–5.81)
RDW: 18.5 % — ABNORMAL HIGH (ref 11.5–15.5)
WBC: 8.5 10*3/uL (ref 4.0–10.5)
nRBC: 0 % (ref 0.0–0.2)

## 2023-08-07 MED ORDER — APIXABAN 5 MG PO TABS
5.0000 mg | ORAL_TABLET | Freq: Two times a day (BID) | ORAL | 1 refills | Status: DC
  Filled 2023-08-07: qty 28, 14d supply, fill #0

## 2023-08-07 NOTE — ED Notes (Signed)
 Delay d/t critical pt influx

## 2023-08-07 NOTE — ED Provider Notes (Signed)
 [facility] Provider Note   CSN: [id number] Arrival date & time: 08/07/23  1202     History  Chief Complaint  Patient presents with   Abnormal EKG/Sent by Dr    Jacob Collier is a 68 y.o. male.  The history is provided by the patient and medical records. No language interpreter was used.  Shortness of Breath Severity:  Moderate Onset quality:  Gradual Duration:  1 week Timing:  Constant Progression:  Waxing and waning Chronicity:  New Context: not URI   Relieved by:  Nothing Worsened by:  Nothing Ineffective treatments:  None tried Associated symptoms: chest pain   Associated symptoms: no abdominal pain, no cough, no diaphoresis, no fever, no headaches, no neck pain, no rash, no sputum production, no vomiting and no wheezing        Home Medications Prior to Admission medications   Medication Sig Start Date End Date Taking? Authorizing Provider  amLODipine  (NORVASC ) 5 MG tablet TAKE 1 TABLET BY MOUTH EVERYDAY AT BEDTIME Patient taking differently: Take 2.5-5 mg by mouth 2 (two) times daily. TAKE 1 TABLET QAM and 1/2 tab at [facility] 02/16/23   Millicent Ally, MD  aspirin  EC 81 MG tablet Take 81 mg by mouth daily. Swallow whole.    [provider]  chlorpheniramine-HYDROcodone  (TUSSIONEX) 10-8 MG/5ML Take 5 mLs by mouth every 12 (twelve) hours as needed for cough. 07/19/23   Cox, Burleigh Carp, MD  clonazePAM  (KLONOPIN ) 1 MG tablet TAKE 1 TABLET BY MOUTH TWICE A DAY AS NEEDED FOR ANXIETY 07/13/23   Cox, Kirsten, MD  clopidogrel  (PLAVIX ) 75 MG tablet Take 1 tablet (75 mg total) by mouth daily. 01/24/23   Odilia Bennett, PA  doxycycline  (VIBRA -TABS) 100 MG tablet Take 1 tablet (100 mg total) by mouth 2 (two) times daily. 07/12/23   Odilia Bennett, PA  Eszopiclone  3 MG TABS TAKE 1 TABLET BY MOUTH EVERYDAY AT BEDTIME 06/05/23   Sirivol, Alla Ar, MD  Evolocumab  (REPATHA  SURECLICK) 140 MG/ML SOAJ INJECT 140 MG INTO THE SKIN EVERY 14 (FOURTEEN)  DAYS. 02/14/23   Millicent Ally, MD  ezetimibe  (ZETIA ) 10 MG tablet TAKE 1 TABLET BY MOUTH EVERY DAY 05/25/23   Millicent Ally, MD  linaclotide  (LINZESS ) 290 MCG CAPS capsule Take 1 capsule (290 mcg total) by mouth daily before breakfast. 07/12/23   Odilia Bennett, PA  metoprolol  succinate (TOPROL -XL) 12.5 mg TB24 24 hr tablet Take 12.5 mg by mouth daily.    [provider]  Omega-3 Fatty Acids (OMEGA 3 PO) Take 500 mg by mouth every morning.    [provider]  Phenylephrine  HCl (AFRIN ALLERGY NA) Place 2 sprays into the nose 2 (two) times daily.    [provider]      Allergies    Ranolazine, Amoxicillin , Atorvastatin , Pravastatin , Rosuvastatin , Statins, Eliquis  [apixaban ], and Xarelto  [rivaroxaban ]    Review of Systems   Review of Systems  Constitutional:  Positive for fatigue. Negative for chills, diaphoresis and fever.  HENT:  Negative for congestion.   Respiratory:  Positive for chest tightness and shortness of breath. Negative for cough, sputum production, wheezing and stridor.   Cardiovascular:  Positive for chest pain. Negative for palpitations and leg swelling.  Gastrointestinal:  Negative for abdominal pain, constipation, diarrhea, nausea and vomiting.  Genitourinary:  Negative for dysuria.  Musculoskeletal:  Negative for back pain, neck pain and neck stiffness.  Skin:  Negative for rash and wound.  Neurological:  Positive for light-headedness.  Negative for weakness and headaches.  Psychiatric/Behavioral:  Negative for agitation and confusion.   All other systems reviewed and are negative.   Physical Exam Updated Vital Signs BP 133/71   Pulse 67   Temp 98.5 F (36.9 C)   Resp 15   Ht 5\' 11"  (1.803 m)   Wt 96.2 kg   SpO2 100%   BMI 29.57 kg/m  Physical Exam Vitals and nursing note reviewed.  Constitutional:      General: He is not in acute distress.    Appearance: He is well-developed. He is not ill-appearing, toxic-appearing or  diaphoretic.  HENT:     Head: Normocephalic and atraumatic.     Mouth/Throat:     Mouth: Mucous membranes are moist.  Eyes:     Extraocular Movements: Extraocular movements intact.     Conjunctiva/sclera: Conjunctivae normal.     Pupils: Pupils are equal, round, and reactive to light.  Cardiovascular:     Rate and Rhythm: Normal rate. Rhythm irregular.     Heart sounds: No murmur heard. Pulmonary:     Effort: Pulmonary effort is normal. No respiratory distress.     Breath sounds: Normal breath sounds. No wheezing, rhonchi or rales.  Chest:     Chest wall: No tenderness.  Abdominal:     General: Abdomen is flat.     Palpations: Abdomen is soft.     Tenderness: There is no abdominal tenderness. There is no guarding or rebound.  Musculoskeletal:        General: No swelling or tenderness.     Cervical back: Neck supple.     Right lower leg: No edema.     Left lower leg: No edema.  Skin:    General: Skin is warm and dry.     Capillary Refill: Capillary refill takes less than 2 seconds.     Findings: No erythema or rash.  Neurological:     General: No focal deficit present.     Mental Status: He is alert.  Psychiatric:        Mood and Affect: Mood normal.     ED Results / Procedures / Treatments   Labs (all labs ordered are listed, but only abnormal results are displayed) Labs Reviewed  BASIC METABOLIC PANEL WITH GFR - Abnormal; Notable for the following components:      Result Value   CO2 20 (*)    Glucose, Bld 113 (*)    All other components within normal limits  CBC - Abnormal; Notable for the following components:   RBC 6.62 (*)    Hemoglobin 17.2 (*)    HCT 54.5 (*)    RDW 18.5 (*)    All other components within normal limits  TROPONIN I (HIGH SENSITIVITY)  TROPONIN I (HIGH SENSITIVITY)    EKG EKG Interpretation Date/Time:  Monday August 07 2023 12:19:24 EDT Ventricular Rate:  93 PR Interval:    QRS Duration:  104 QT Interval:  422 QTC Calculation: 524 R  Axis:   92  Text Interpretation: Atrial flutter with variable A-V block Rightward axis Possible Inferior infarct , age undetermined Cannot rule out Anterior infarct , age undetermined Prolonged QT Abnormal ECG When compared with ECG of 04-Dec-2022 04:24, PREVIOUS ECG IS PRESENT when compared to prior, now aflutter NO STEMI Confirmed by Wynell Heath ([id number]) Collier 08/07/2023 1:27:03 PM  Radiology DG Chest 2 View Result Date: 08/07/2023 CLINICAL DATA:  Chest palpiations EXAM: CHEST - 2 VIEW COMPARISON:  December 04, 2022 FINDINGS: No  focal airspace consolidation, pleural effusion, or pneumothorax. No cardiomegaly. Sternotomy wires and CABG markers. No acute fracture or destructive lesion. Multilevel degenerative disc disease of the spine. Cervical fusion hardware again noted. IMPRESSION: No acute cardiopulmonary abnormality. Electronically Signed   By: Rance Burrows M.D.   Collier: 08/07/2023 14:03    Procedures Procedures    Medications Ordered in ED Medications - No data to display  ED Course/ Medical Decision Making/ A&P                                 Medical Decision Making Amount and/or Complexity of Data Reviewed Labs: ordered. Radiology: ordered.  Risk Decision regarding hospitalization.    SEMAJ COBURN is a 68 y.o. male with a past medical history significant for previous prostate cancer, hypertension, CAD status post CABG, previous SVT status post ablation, and a brief episode of postprocedural A-fib that resolved who presents with worsening fatigue, exertional shortness of breath, and abnormal EKG.  Patient says that for the last 2 weeks associated more fatigue and shortness of breath at times.  He reports he does not have palpitations or chest pain but was told his EKG showed concerning findings with a flutter.  He is never had flutter before but had a brief episode of A-fib after procedure years ago.  He reports no trauma.  He reports lightheadedness and near syncope no actual  syncopal episodes.  He denies nausea, vomiting, constipation, diarrhea, or urinary changes.  Denies any new edema at this time.  Collier exam, lungs clear.  Chest nontender.  Abdomen nontender.  Patient has intact pulses and his heart rate is variable.  EKG showed a flutter but no STEMI.  Otherwise patient resting.  Clinically I am concerned about his new a flutter causing his several weeks of fatigue as he does not have palpitations and a clear timeline for when he began.  He does report he started Linzess  several weeks ago and is unsure if this is related.  His PCP told him to come here to see cardiology and get managed and then when I called cardiology they recommended admission to medicine.  2:57 PM Spoke to cardiology who will see the patient but they recommended medical admission for further management of fatigue, new atrial fibs/flutter, and exertional shortness of breath.  Cardiology will see the patient in consultation.         Final Clinical Impression(s) / ED Diagnoses Final diagnoses:  Atrial fibrillation and flutter (HCC)  Exertional shortness of breath  Near syncope  Lightheadedness     Clinical Impression: 1. Atrial fibrillation and flutter (HCC)   2. Exertional shortness of breath   3. Near syncope   4. Lightheadedness     Disposition: Admit  This note was prepared with assistance of Conservation officer, historic buildings. Occasional wrong-word or sound-a-like substitutions may have occurred due to the inherent limitations of voice recognition software.      Shakita Keir, Marine Sia, MD 08/07/23 [phone]

## 2023-08-07 NOTE — ED Notes (Signed)
 EDP at [facility]

## 2023-08-07 NOTE — Assessment & Plan Note (Addendum)
 History of extensive pulmonary embolism with current shortness of breath. Urgent evaluation needed to rule out recurrence. - Refer to emergency department at [facility] for evaluation.  Respiratory syncytial virus (RSV) infection Ongoing symptoms for approximately five weeks. Improvement noted but residual symptoms persist. RSV can last 6-8 weeks in older adults.

## 2023-08-07 NOTE — Assessment & Plan Note (Addendum)
 Intermittent arrhythmia with heart rate fluctuations. Possible link to Linzess  use. Concerns about electrolyte imbalance. EKG shows possible transition to Atrial flutter from afib or inferior infarct. Patient refused ambulance - Refer to emergency department at [facility] for further evaluation. - Continue aspirin  81 mg daily. - Continue Plavix  75 mg daily.  Electrolyte imbalance Potential imbalance due to Linzess  use, contributing to arrhythmia. He has been using Pedialyte to manage levels. - Consider reducing Linzess  dosage pending further evaluation.

## 2023-08-07 NOTE — ED Provider Notes (Signed)
  Physical Exam  BP 136/83   Pulse 68   Temp 98.5 F (36.9 C)   Resp 15   Ht 5\' 11"  (1.803 m)   Wt 96.2 kg   SpO2 100%   BMI 29.57 kg/m   Physical Exam Constitutional:      General: He is not in acute distress.    Appearance: Normal appearance.  HENT:     Head: Normocephalic and atraumatic.     Nose: No congestion or rhinorrhea.  Eyes:     General:        Right eye: No discharge.        Left eye: No discharge.     Extraocular Movements: Extraocular movements intact.     Pupils: Pupils are equal, round, and reactive to light.  Cardiovascular:     Rate and Rhythm: Normal rate. Rhythm irregular.     Heart sounds: No murmur heard. Pulmonary:     Effort: No respiratory distress.     Breath sounds: No wheezing or rales.  Abdominal:     General: There is no distension.     Tenderness: There is no abdominal tenderness.  Musculoskeletal:        General: Normal range of motion.     Cervical back: Normal range of motion.  Skin:    General: Skin is warm and dry.  Neurological:     General: No focal deficit present.     Mental Status: He is alert.     Procedures  Procedures  ED Course / MDM    Medical Decision Making Amount and/or Complexity of Data Reviewed Labs: ordered. Radiology: ordered.   Patient received in handoff.  Persistent a flutter pending cardiology evaluation.  Cardiology evaluate the patient is recommending discharge home for cardioversion in 3 weeks.  Cardiology sent Eliquis  to pharmacy.  Patient agrees with this plan and patient discharged with outpatient follow-up and strict return precautions of which he voiced understanding       Karlyn Overman, MD 08/07/23 [phone]

## 2023-08-07 NOTE — ED Notes (Signed)
 Pt alert, NAD, calm, interactive, resps e/u, speaking in clear complete sentences. Denies CP, pain or angina. Denies nausea, blurred vision. Endorses some sob, and some dizziness with walking. Skin W&D.

## 2023-08-07 NOTE — Progress Notes (Signed)
 Acute Office Visit  Subjective:    Patient ID: Jacob Collier, male    DOB: [birth year], 68 y.o.   MRN: [id number]  Chief Complaint  Patient presents with   Shortness of breath    HPI: Patient is in today for SOB, fluttering. States it has been going on for the past few weeks.  Discussed the use of AI scribe software for clinical note transcription with the patient, who gave verbal consent to proceed.  History of Present Illness   Jacob Collier "Wandalee Gust" is a 68 year old male who presents with arrhythmia and shortness of breath.  He experiences arrhythmia with heart rate fluctuations between 42 to 93 beats per minute, lasting up to 20 minutes. He uses a finger meter to monitor his heart rate. No chest pain or angina is associated with these episodes.  He has shortness of breath, which he attributes to recovering from RSV and a tumor. He notes ongoing symptoms in his lungs, including rattling and coughing, and mentions that recovery can take six to eight weeks for older individuals. He is currently five weeks into recovery.  He has experienced significant weight loss, from 231 pounds to 212.2 pounds. He also had an episode of low blood sugar, resulting in fogginess and fatigue, which improved after consuming cottage cheese and fruit.  He started experiencing heart irregularities after beginning Linzess , which he has since discontinued. He believes the medication may have affected his electrolyte balance, leading him to use Pedialyte for regulation. He has not taken Linzess  since the previous Friday.  Regarding bowel movements, Linzess  was effective but caused one instance of diarrhea lasting a third of a day. He typically experiences one large, non-solid bowel movement followed by heart issues.       Past Medical History:  Diagnosis Date   Anxiety    Arthritis    Coronary artery disease    a. S/p DES to RCA in 01/2019 b. s/p CABG x4 (LIMA-LAD, reverse SVG-PDA, reverse SVG-OM1,  reverse SVG-1st Diag) in 01/2021   Diverticulosis    Dyspnea    Hard of hearing    Hyperlipidemia    Hypertension    hx of elevation on lyrica , off med and now normal    Paroxysmal SVT (supraventricular tachycardia) (HCC)    Post-op atrial fibrillation    Prostate cancer (HCC)    Wears glasses     Past Surgical History:  Procedure Laterality Date   ANTERIOR CERVICAL DECOMP/DISCECTOMY FUSION N/A 09/03/2021   Procedure: CERVICAL FIVE-SIX, CERVICAL SIX-SEVEN ANTERIOR CERVICAL DECOMPRESSION/DISCECTOMY FUSION;  Surgeon: Agustina Aldrich, MD;  Location: [facility] OR;  Service: Neurosurgery;  Laterality: N/A;   CARDIAC CATHETERIZATION     with 2 stents    COLECTOMY  05/2018   COLONOSCOPY     CORONARY ARTERY BYPASS GRAFT N/A 01/19/2021   Procedure: CORONARY ARTERY BYPASS GRAFTING (CABG), ON PUMP TIMES FOUR, USING LEFT INTERNAL MAMMARY ARTERY AND ENDOSOCPICALLY HARVESTED RIGHT GREATER SAPHENOUS VEIN;  Surgeon: Hilarie Lovely, MD;  Location: [facility] OR;  Service: Open Heart Surgery;  Laterality: N/A;   CYSTOSCOPY WITH URETHRAL DILATATION N/A 01/14/2021   Procedure: CYSTOSCOPY WITH BALLOON  DILATATION;  Surgeon: Erman Hayward, MD;  Location: [facility];  Service: Urology;  Laterality: N/A;   EYE SURGERY  02/2020   bilaterl cataracts   IR THORACENTESIS ASP PLEURAL SPACE W/IMG GUIDE  02/02/2021   LEFT HEART CATH AND CORONARY ANGIOGRAPHY N/A 12/24/2020   Procedure: LEFT HEART CATH AND CORONARY ANGIOGRAPHY;  Surgeon: Swazil[place], Peter  M, MD;  Location: [facility];  Service: Cardiovascular;  Laterality: N/A;   PROSTATECTOMY     SVT ABLATION N/A 10/20/2022   Procedure: SVT ABLATION;  Surgeon: Efraim Grange, MD;  Location: [facility];  Service: Cardiovascular;  Laterality: N/A;   TEE WITHOUT CARDIOVERSION N/A 01/19/2021   Procedure: TRANSESOPHAGEAL ECHOCARDIOGRAM (TEE);  Surgeon: Hilarie Lovely, MD;  Location: [facility] OR;  Service: Open Heart Surgery;  Laterality: N/A;   TOTAL HIP ARTHROPLASTY  Left 11/10/2015   Procedure: LEFT TOTAL HIP ARTHROPLASTY ANTERIOR APPROACH;  Surgeon: Arnie Lao, MD;  Location: [facility] OR;  Service: Orthopedics;  Laterality: Left;    Family History  Problem Relation Age of Onset   Alzheimer's disease Mother    Stroke Father    Diabetes Sister    Heart attack Maternal Grandmother    Cancer Maternal Uncle        prostate   Prostate cancer Maternal Uncle    Cancer Maternal Uncle        prostate   Prostate cancer Maternal Uncle    Colon polyps Brother    Colon cancer Neg Hx    Esophageal cancer Neg Hx    Rectal cancer Neg Hx    Stomach cancer Neg Hx     Social History   Socioeconomic History   Marital status: Married    Spouse name: Not on file   Number of children: 0   Years of education: Not on file   Highest education level: Not on file  Occupational History   Occupation: Employed at [employer]  Tobacco Use   Smoking status: Never   Smokeless tobacco: Never  Vaping Use   Vaping status: Never Used  Substance and Sexual Activity   Alcohol use: Not Currently    Alcohol/week: 0.0 standard drinks of alcohol    Comment: rarely   Drug use: No   Sexual activity: Yes    Partners: Female    Birth control/protection: None  Other Topics Concern   Not on file  Social History Narrative   Not on file   Social Drivers of Health   Financial Resource Strain: Low Risk  (08/04/2022)   Overall Financial Resource Strain (CARDIA)    Difficulty of Paying Living Expenses: Not hard at all  Food Insecurity: No Food Insecurity (10/29/2022)   Hunger Vital Sign    Worried About Running Out of Food in the Last Year: Never true    Ran Out of Food in the Last Year: Never true  Transportation Needs: No Transportation Needs (10/29/2022)   PRAPARE - Administrator, Civil Service (Medical): No    Lack of Transportation (Non-Medical): No  Physical Activity: Sufficiently Active (08/04/2022)   Exercise Vital Sign    Days of Exercise per  Week: 5 days    Minutes of Exercise per Session: 60 min  Stress: No Stress Concern Present (08/04/2022)   [employer] of Occupational Health - Occupational Stress Questionnaire    Feeling of Stress : Not at all  Social Connections: Moderately Isolated (08/04/2022)   Social Connection and Isolation Panel [NHANES]    Frequency of Communication with Friends and Family: More than three times a week    Frequency of Social Gatherings with Friends and Family: More than three times a week    Attends Religious Services: Never    Database administrator or Organizations: No    Attends Banker Meetings: Never    Marital Status:  Married  Intimate Partner Violence: Not At Risk (10/29/2022)   Humiliation, Afraid, Rape, and Kick questionnaire    Fear of Current or Ex-Partner: No    Emotionally Abused: No    Physically Abused: No    Sexually Abused: No    Outpatient Medications Prior to Visit  Medication Sig Dispense Refill   amLODipine  (NORVASC ) 5 MG tablet TAKE 1 TABLET BY MOUTH EVERYDAY AT BEDTIME (Patient taking differently: Take 2.5-5 mg by mouth 2 (two) times daily. TAKE 1 TABLET QAM and 1/2 tab at QHS) 90 tablet 3   aspirin  EC 81 MG tablet Take 81 mg by mouth daily. Swallow whole.     chlorpheniramine-HYDROcodone  (TUSSIONEX) 10-8 MG/5ML Take 5 mLs by mouth every 12 (twelve) hours as needed for cough. 115 mL 0   clonazePAM  (KLONOPIN ) 1 MG tablet TAKE 1 TABLET BY MOUTH TWICE A DAY AS NEEDED FOR ANXIETY 60 tablet 3   clopidogrel  (PLAVIX ) 75 MG tablet Take 1 tablet (75 mg total) by mouth daily. 90 tablet 3   doxycycline  (VIBRA -TABS) 100 MG tablet Take 1 tablet (100 mg total) by mouth 2 (two) times daily. 20 tablet 0   Eszopiclone  3 MG TABS TAKE 1 TABLET BY MOUTH EVERYDAY AT BEDTIME 30 tablet 1   Evolocumab  (REPATHA  SURECLICK) 140 MG/ML SOAJ INJECT 140 MG INTO THE SKIN EVERY 14 (FOURTEEN) DAYS. 6 mL 3   ezetimibe  (ZETIA ) 10 MG tablet TAKE 1 TABLET BY MOUTH EVERY DAY 90 tablet 2    linaclotide  (LINZESS ) 290 MCG CAPS capsule Take 1 capsule (290 mcg total) by mouth daily before breakfast. 30 capsule 3   metoprolol  succinate (TOPROL -XL) 12.5 mg TB24 24 hr tablet Take 12.5 mg by mouth daily.     Omega-3 Fatty Acids (OMEGA 3 PO) Take 500 mg by mouth every morning.     Phenylephrine  HCl (AFRIN ALLERGY NA) Place 2 sprays into the nose 2 (two) times daily.     No facility-administered medications prior to visit.    Allergies  Allergen Reactions   Ranolazine Other (See Comments)    Chest discomfort/Reflux   Amoxicillin  Hives   Atorvastatin  Other (See Comments)    myalgias  Other reaction(s): Myalgias (intolerance)   Pravastatin  Other (See Comments)    myalgias  Other reaction(s): Myalgias (intolerance)   Rosuvastatin  Other (See Comments)    myalgias  Other reaction(s): Myalgias (intolerance)   Statins Other (See Comments)    myalgias   Eliquis  [Apixaban ] Rash and Other (See Comments)    Joint discomfort and elevated BP   Xarelto  [Rivaroxaban ] Rash    Joint discomfort, elevated BP    Review of Systems  Constitutional:  Negative for appetite change, fatigue and fever.  HENT:  Negative for congestion, ear pain, sinus pressure and sore throat.   Respiratory:  Positive for shortness of breath. Negative for cough, chest tightness and wheezing.   Cardiovascular:  Positive for palpitations. Negative for chest pain.  Gastrointestinal:  Negative for abdominal pain, constipation, diarrhea, nausea and vomiting.  Genitourinary:  Negative for dysuria and hematuria.  Musculoskeletal:  Negative for arthralgias, back pain, joint swelling and myalgias.  Skin:  Negative for rash.  Neurological:  Negative for dizziness, weakness and headaches.  Psychiatric/Behavioral:  Negative for dysphoric mood. The patient is not nervous/anxious.        Objective:         08/07/2023   10:44 AM 07/20/2023    8:12 AM 07/19/2023    1:35 PM  Vitals with BMI  Height 5'  11" 5\' 11"  5\' 11"    Weight 216 lbs 231 lbs 229 lbs  BMI 30.14 32.23 31.95  Systolic 142 158 161  Diastolic 82 76 82  Pulse 94 74 77    Orthostatic VS for the past 72 hrs (Last 3 readings):  Patient Position BP Location Cuff Size  08/07/23 1044 Sitting Left Arm Small     Physical Exam Constitutional:      Appearance: He is obese.  Cardiovascular:     Rate and Rhythm: Normal rate. Rhythm irregular.     Heart sounds: Murmur heard.  Pulmonary:     Effort: Pulmonary effort is normal.  Neurological:     General: No focal deficit present.     Mental Status: He is alert and oriented to person, place, and time.     Health Maintenance Due  Topic Date Due   Medicare Annual Wellness (AWV)  Never done   DTaP/Tdap/Td (1 - Tdap) Never done   Zoster Vaccines- Shingrix (1 of 2) Never done    There are no preventive care reminders to display for this patient.   Lab Results  Component Value Date   TSH 1.570 04/04/2022   Lab Results  Component Value Date   WBC 10.1 06/07/2023   HGB 15.1 06/07/2023   HCT 46.7 06/07/2023   MCV 81.6 06/07/2023   PLT 227 06/07/2023   Lab Results  Component Value Date   NA 137 05/11/2023   K 4.7 05/11/2023   CO2 19 (L) 05/11/2023   GLUCOSE 88 05/11/2023   BUN 18 05/11/2023   CREATININE 1.12 05/11/2023   BILITOT 0.6 05/11/2023   ALKPHOS 107 05/11/2023   AST 23 05/11/2023   ALT 33 05/11/2023   PROT 7.3 05/11/2023   ALBUMIN  4.6 05/11/2023   CALCIUM  9.9 05/11/2023   ANIONGAP 7 01/13/2023   EGFR 71 05/11/2023   Lab Results  Component Value Date   CHOL 85 (L) 05/11/2023   Lab Results  Component Value Date   HDL 39 (L) 05/11/2023   Lab Results  Component Value Date   LDLCALC 25 05/11/2023   Lab Results  Component Value Date   TRIG 114 05/11/2023   Lab Results  Component Value Date   CHOLHDL 2.2 05/11/2023   Lab Results  Component Value Date   HGBA1C 6.0 (H) 05/11/2023       Assessment & Plan:  Palpitations Assessment & Plan: Intermittent  arrhythmia with heart rate fluctuations. Possible link to Linzess  use. Concerns about electrolyte imbalance. EKG shows possible severe arrhythmia or inferior infarct. Patient refused ambulance - Refer to emergency department at [facility] for further evaluation. - Continue aspirin  81 mg daily. - Continue Plavix  75 mg daily.  Electrolyte imbalance Potential imbalance due to Linzess  use, contributing to arrhythmia. He has been using Pedialyte to manage levels. - Consider reducing Linzess  dosage pending further evaluation.  Orders: -     EKG 12-Lead  Shortness of breath Assessment & Plan: History of extensive pulmonary embolism with current shortness of breath. Urgent evaluation needed to rule out recurrence. - Refer to emergency department at [facility] for evaluation.  Respiratory syncytial virus (RSV) infection Ongoing symptoms for approximately five weeks. Improvement noted but residual symptoms persist. RSV can last 6-8 weeks in older adults.     No orders of the defined types were placed in this encounter.   Orders Placed This Encounter  Procedures   EKG 12-Lead     Follow-up: No follow-ups on file.  An After Visit  Summary was printed and given to the patient.   I,Lauren M Auman,acting as a Neurosurgeon for [employer], [employer].,have documented all relevant documentation on the behalf of Odilia Bennett, PA,as directed by  Odilia Bennett, PA while in the presence of Odilia Bennett, [name].    Odilia Bennett, [name] [facility] [phone]

## 2023-08-07 NOTE — Telephone Encounter (Signed)
 Copied from CRM [id number]. Topic: Clinical - Red Word Triage >> Aug 07, 2023  8:04 AM Alethia Huxley E wrote: [facility] that prompted transfer to Nurse Triage: Irregular heartbeat. Patient is having an irregular heartbeat. Stated his normal is 71 but it jumps up to 93-94 and stays there for 10-15 minutes. Happened several times this morning already.    Chief Complaint: Irregular Heart Rates Symptoms: fatigue Frequency: a few weeks Pertinent Negatives: Patient denies difficulty breathing Disposition: [] ED /[] Urgent Care (no appt availability in office) / [] Appointment(In office/virtual)/ []  [place] [facility]/ [] Home Care/ [x] Refused Recommended Disposition /[] [place] [facility]/ []  Follow-up with PCP Additional Notes: Patient called and advised that last year he had an ablation. He has been taking Linzess  so he doesn't know if that is causing this issue or not.  Patient states that his heart rate has been going up and down irregularly. 67-68 to 93-94 beats a minute and it stays for a few minutes. Patient states that his heart rate has gone down into the 40s while speaking with this RN and he states that if he got up and walked he would give out of breath Patient is advised that the recommendation at this time is to go to the Emergency Room. Patient states that he doesn't want to go to the Emergency Room, stay all day and then be told to follow up with Cardiology. He states that the last time this happened he was seen in the office and taken care of from there.  This RN wants the patient to be seen today/immediately for these concerns and with the patient refusing the Emergency Room at this time, this RN called the CAL for further assistance. Patient is connected through to the CAL at this time for further assistance.  Reason for Disposition  Patient sounds very sick or weak to the triager    Patient states this feels like the arrhythmia he had prior to having to have an ablation done last  year. Patient has had episodes of light headedness and states that he is not having trouble breathing but something is going on and he feels very fatigued. He also feels like if he got up and tried to walk around he would get short of breath at this time.  Answer Assessment - Initial Assessment Questions 1. DESCRIPTION: "Please describe your heart rate or heartbeat that you are having" (e.g., fast/slow, regular/irregular, skipped or extra beats, "palpitations")     Heart rates changes 2. ONSET: "When did it start?" (Minutes, hours or days)      Few weeks ago 3. DURATION: "How long does it last" (e.g., seconds, minutes, hours)     An hour last night  4. PATTERN "Does it come and go, or has it been constant since it started?"  "Does it get worse with exertion?"   "Are you feeling it now?"     Comes and goes and stays a while 5. TAP: "Using your hand, can you tap out what you are feeling on a chair or table in front of you, so that I can hear?" (Note: not all patients can do this)       ---- 6. HEART RATE: "Can you tell me your heart rate?" "How many beats in 15 seconds?"  (Note: not all patients can do this)       90s now 7. RECURRENT SYMPTOM: "Have you ever had this before?" If Yes, ask: "When was the last time?" and "What happened that time?"      ---  8. CAUSE: "What do you think is causing the palpitations?"     unsure 9. CARDIAC HISTORY: "Do you have any history of heart disease?" (e.g., heart attack, angina, bypass surgery, angioplasty, arrhythmia)      Ablation 10. OTHER SYMPTOMS: "Do you have any other symptoms?" (e.g., dizziness, chest pain, sweating, difficulty breathing)       Feels something different but not trouble breathing  Protocols used: Heart Rate and Heartbeat Questions-A-AH

## 2023-08-07 NOTE — ED Triage Notes (Signed)
 Pt came in via POV d/t the last 2 wks d/t being sent by his Dr for abnormal EKG. Pt reports he was there to see the Provider d/t his cardiac Hx & his hear rate has been irregular (per pt) since he took Linzes for stool regulation after having RSV 2 wks ago. A/Ox4, denies current pain during triage.

## 2023-08-07 NOTE — ED Notes (Signed)
 Endorses taking testosterone , unknown timeframe

## 2023-08-07 NOTE — Assessment & Plan Note (Deleted)
 History of extensive pulmonary embolism with current shortness of breath. Urgent evaluation needed to rule out recurrence. - Refer to emergency department at [facility] for evaluation.

## 2023-08-07 NOTE — Patient Instructions (Signed)
 VISIT SUMMARY:  Today, you were seen for arrhythmia and shortness of breath. You have been experiencing heart rate fluctuations and symptoms related to a recent RSV infection. You also reported significant weight loss and an episode of low blood sugar. We discussed the potential link between your symptoms and the medication Linzess , which you have discontinued.  YOUR PLAN:  -ARRHYTHMIA: Arrhythmia is an irregular heartbeat that can cause your heart to beat too fast or too slow. We are concerned about a possible severe arrhythmia or heart issue, so you should go to the emergency department at [facility] for further evaluation. Continue taking your daily aspirin  (81 mg) and Plavix  (75 mg).  -ELECTROLYTE IMBALANCE: An electrolyte imbalance occurs when the levels of minerals in your body are not in balance, which can affect your heart rhythm. This may be related to your use of Linzess . We suggest considering a reduction in your Linzess  dosage pending further evaluation.  -PULMONARY EMBOLISM: A pulmonary embolism is a blockage in one of the pulmonary arteries in your lungs, which can cause shortness of breath. Given your history and current symptoms, you need to go to the emergency department at [place] for an urgent evaluation to rule out a recurrence.  -RESPIRATORY SYNCYTIAL VIRUS (RSV) INFECTION: RSV is a respiratory virus that can cause lung symptoms and takes longer to recover from in older adults. You are currently five weeks into recovery, and it can take up to eight weeks to fully recover. Continue to monitor your symptoms.  INSTRUCTIONS:  Please go to the emergency department at [facility] for further evaluation of your arrhythmia and to rule out a recurrence of pulmonary embolism. Continue taking your daily medications as prescribed. Consider reducing your Linzess  dosage pending further evaluation.

## 2023-08-07 NOTE — Consult Note (Addendum)
 Cardiology Consultation   Patient ID: Jacob Collier MRN: [id number]; DOB: [birth year]  Admit date: 08/07/2023 Date of Consult: 08/07/2023  PCP:  Mercy Stall, MD   [facility] Providers Cardiologist:  Magnus Schuller, MD  Cardiology APP:  Goodrich, Callie E, PA-C  Electrophysiologist:  Efraim Grange, MD       Patient Profile: Jacob Collier is a 68 y.o. male with a hx of CAD s/p DES and CABG, AVNRT s/p ablation, pulmonary embolism, who is being seen 08/07/2023 for the evaluation of atrial flutter at the request of Dr. Manus Sellers.  History of Present Illness: Jacob Collier has a longstanding cardiac history. Patient first noted with CAD in 2018 and received PCI to RCA in 2020 with known CTO of LCX. Patient had repeat LHC in 2021 at [facility] showing in-stent restenosis of RCA with progression to CTO. Patient did not have intervention at [facility] and came to [facility] for second opinion. Films reviewed and ultimately medical management recommended. During 2021, patient was seen in the ED with SVT, PVCs, bigeminy. He was started on beta blocker therapy with notable improvement in SVT/symptoms. Notes indicate patient stopped his statin regimen in Spring 2022 due to myalgias. In fall of 2022, patient was seen in clinic with chest pain, palpitations and he underwent LHC with Dr. Swaziland. This revealed severe 3 vessel CAD (new LAD stenosis). Patient referred to CT surgery and underwent CABG 01/19/21 with LIMA to LAD, SVG to OM1, SVG to first diagonal, and SVG to PDA. Postoperatively patient had afib and was treated with amiodarone . Patient eventually started on Repatha  for lipid management. Patient saw Dr. Arlester Ladd in 2024 due to hx SVT and ultimately underwent successful ablation. This procedure was complicated by provoked PE and patient was treated with Edoxaban  x3 months (intolerant of Xarelto  and Eliquis ).   Patient presented today to his primary care provider for evaluation of dyspnea and flutter  heart sensation over the past few weeks. Clinic ECG noted atrial flutter with 4:1 conduction pattern and patient advised to present to the ED. On my exam of patient in the ED, he appears comfortable/in no acute distress. Patient reports increased fatigue with frequent awareness of palpitations in the last 2 weeks. He speculates that his arrhythmia was provoked by either recent RSV or starting Linzess  2 weeks ago. Patient reports that he is still able to walk/be physically active but admits that he feels more winded when doing so. Ex: walked his dog for 1/2 mile yesterday and felt more tired that he previously would have. Denies chest pain.    Past Medical History:  Diagnosis Date   Anxiety    Arthritis    Coronary artery disease    a. S/p DES to RCA in 01/2019 b. s/p CABG x4 (LIMA-LAD, reverse SVG-PDA, reverse SVG-OM1, reverse SVG-1st Diag) in 01/2021   Diverticulosis    Dyspnea    Hard of hearing    Hyperlipidemia    Hypertension    hx of elevation on lyrica , off med and now normal    Paroxysmal SVT (supraventricular tachycardia) (HCC)    Post-op atrial fibrillation    Prostate cancer (HCC)    Wears glasses     Past Surgical History:  Procedure Laterality Date   ANTERIOR CERVICAL DECOMP/DISCECTOMY FUSION N/A 09/03/2021   Procedure: CERVICAL FIVE-SIX, CERVICAL SIX-SEVEN ANTERIOR CERVICAL DECOMPRESSION/DISCECTOMY FUSION;  Surgeon: Agustina Aldrich, MD;  Location: [facility] OR;  Service: Neurosurgery;  Laterality: N/A;   CARDIAC CATHETERIZATION     with 2  stents    COLECTOMY  05/2018   COLONOSCOPY     CORONARY ARTERY BYPASS GRAFT N/A 01/19/2021   Procedure: CORONARY ARTERY BYPASS GRAFTING (CABG), ON PUMP TIMES FOUR, USING LEFT INTERNAL MAMMARY ARTERY AND ENDOSOCPICALLY HARVESTED RIGHT GREATER SAPHENOUS VEIN;  Surgeon: Hilarie Lovely, MD;  Location: [facility] OR;  Service: Open Heart Surgery;  Laterality: N/A;   CYSTOSCOPY WITH URETHRAL DILATATION N/A 01/14/2021   Procedure: CYSTOSCOPY WITH BALLOON   DILATATION;  Surgeon: Erman Hayward, MD;  Location: [facility] ORS;  Service: Urology;  Laterality: N/A;   EYE SURGERY  02/2020   bilaterl cataracts   IR THORACENTESIS ASP PLEURAL SPACE W/IMG GUIDE  02/02/2021   LEFT HEART CATH AND CORONARY ANGIOGRAPHY N/A 12/24/2020   Procedure: LEFT HEART CATH AND CORONARY ANGIOGRAPHY;  Surgeon: Swaziland, Peter M, MD;  Location: [facility] INVASIVE CV LAB;  Service: Cardiovascular;  Laterality: N/A;   PROSTATECTOMY     SVT ABLATION N/A 10/20/2022   Procedure: SVT ABLATION;  Surgeon: Efraim Grange, MD;  Location: [facility] INVASIVE CV LAB;  Service: Cardiovascular;  Laterality: N/A;   TEE WITHOUT CARDIOVERSION N/A 01/19/2021   Procedure: TRANSESOPHAGEAL ECHOCARDIOGRAM (TEE);  Surgeon: Hilarie Lovely, MD;  Location: [facility] OR;  Service: Open Heart Surgery;  Laterality: N/A;   TOTAL HIP ARTHROPLASTY Left 11/10/2015   Procedure: LEFT TOTAL HIP ARTHROPLASTY ANTERIOR APPROACH;  Surgeon: Arnie Lao, MD;  Location: [facility] OR;  Service: Orthopedics;  Laterality: Left;     Home Medications:  Prior to Admission medications   Medication Sig Start Date End Date Taking? Authorizing Provider  amLODipine  (NORVASC ) 5 MG tablet TAKE 1 TABLET BY MOUTH EVERYDAY AT BEDTIME Patient taking differently: Take 2.5-5 mg by mouth 2 (two) times daily. TAKE 1 TABLET QAM and 1/2 tab at QHS 02/16/23   Millicent Ally, MD  aspirin  EC 81 MG tablet Take 81 mg by mouth daily. Swallow whole.    [provider]  chlorpheniramine-HYDROcodone  (TUSSIONEX) 10-8 MG/5ML Take 5 mLs by mouth every 12 (twelve) hours as needed for cough. 07/19/23   Cox, Burleigh Carp, MD  clonazePAM  (KLONOPIN ) 1 MG tablet TAKE 1 TABLET BY MOUTH TWICE A DAY AS NEEDED FOR ANXIETY 07/13/23   Cox, Kirsten, MD  clopidogrel  (PLAVIX ) 75 MG tablet Take 1 tablet (75 mg total) by mouth daily. 01/24/23   Odilia Bennett, PA  doxycycline  (VIBRA -TABS) 100 MG tablet Take 1 tablet (100 mg total) by mouth 2 (two) times daily. 07/12/23   Odilia Bennett, PA  Eszopiclone  3 MG TABS TAKE 1 TABLET BY MOUTH EVERYDAY AT BEDTIME 06/05/23   Sirivol, Alla Ar, MD  Evolocumab  (REPATHA  SURECLICK) 140 MG/ML SOAJ INJECT 140 MG INTO THE SKIN EVERY 14 (FOURTEEN) DAYS. 02/14/23   Millicent Ally, MD  ezetimibe  (ZETIA ) 10 MG tablet TAKE 1 TABLET BY MOUTH EVERY DAY 05/25/23   Millicent Ally, MD  linaclotide  (LINZESS ) 290 MCG CAPS capsule Take 1 capsule (290 mcg total) by mouth daily before breakfast. 07/12/23   Odilia Bennett, PA  metoprolol  succinate (TOPROL -XL) 12.5 mg TB24 24 hr tablet Take 12.5 mg by mouth daily.    [provider]  Omega-3 Fatty Acids (OMEGA 3 PO) Take 500 mg by mouth every morning.    [provider]  Phenylephrine  HCl (AFRIN ALLERGY NA) Place 2 sprays into the nose 2 (two) times daily.    [provider]    Scheduled Meds:  Continuous Infusions:  PRN Meds:   Allergies:    Allergies  Allergen Reactions  Ranolazine Other (See Comments)    Chest discomfort/Reflux   Amoxicillin  Hives   Atorvastatin  Other (See Comments)    myalgias  Other reaction(s): Myalgias (intolerance)   Pravastatin  Other (See Comments)    myalgias  Other reaction(s): Myalgias (intolerance)   Rosuvastatin  Other (See Comments)    myalgias  Other reaction(s): Myalgias (intolerance)   Statins Other (See Comments)    myalgias   Eliquis  [Apixaban ] Rash and Other (See Comments)    Joint discomfort and elevated BP   Xarelto  [Rivaroxaban ] Rash    Joint discomfort, elevated BP    Social History:   Social History   Socioeconomic History   Marital status: Married    Spouse name: Not on file   Number of children: 0   Years of education: Not on file   Highest education level: Not on file  Occupational History   Occupation: Employed at [employer]  Tobacco Use   Smoking status: Never   Smokeless tobacco: Never  Vaping Use   Vaping status: Never Used  Substance and Sexual Activity   Alcohol use: Not Currently     Alcohol/week: 0.0 standard drinks of alcohol    Comment: rarely   Drug use: No   Sexual activity: Yes    Partners: Female    Birth control/protection: None  Other Topics Concern   Not on file  Social History Narrative   Not on file   Social Drivers of Health   Financial Resource Strain: Low Risk  (08/04/2022)   Overall Financial Resource Strain (CARDIA)    Difficulty of Paying Living Expenses: Not hard at all  Food Insecurity: No Food Insecurity (10/29/2022)   Hunger Vital Sign    Worried About Running Out of Food in the Last Year: Never true    Ran Out of Food in the Last Year: Never true  Transportation Needs: No Transportation Needs (10/29/2022)   PRAPARE - Administrator, Civil Service (Medical): No    Lack of Transportation (Non-Medical): No  Physical Activity: Sufficiently Active (08/04/2022)   Exercise Vital Sign    Days of Exercise per Week: 5 days    Minutes of Exercise per Session: 60 min  Stress: No Stress Concern Present (08/04/2022)   [employer] of Occupational Health - Occupational Stress Questionnaire    Feeling of Stress : Not at all  Social Connections: Moderately Isolated (08/04/2022)   Social Connection and Isolation Panel [NHANES]    Frequency of Communication with Friends and Family: More than three times a week    Frequency of Social Gatherings with Friends and Family: More than three times a week    Attends Religious Services: Never    Database administrator or Organizations: No    Attends Banker Meetings: Never    Marital Status: Married  Catering manager Violence: Not At Risk (10/29/2022)   Humiliation, Afraid, Rape, and Kick questionnaire    Fear of Current or Ex-Partner: No    Emotionally Abused: No    Physically Abused: No    Sexually Abused: No    Family History:    Family History  Problem Relation Age of Onset   Alzheimer's disease Mother    Stroke Father    Diabetes Sister    Heart attack Maternal Grandmother     Cancer Maternal Uncle        prostate   Prostate cancer Maternal Uncle    Cancer Maternal Uncle        prostate  Prostate cancer Maternal Uncle    Colon polyps Brother    Colon cancer Neg Hx    Esophageal cancer Neg Hx    Rectal cancer Neg Hx    Stomach cancer Neg Hx      ROS:  Please see the history of present illness.   All other ROS reviewed and negative.     Physical Exam/Data: Vitals:   08/07/23 1345 08/07/23 1400 08/07/23 1415 08/07/23 1430  BP: 133/71  133/86 136/83  Pulse: 72 67 67 68  Resp: (!) 23 15 12 15   Temp:      SpO2: 100% 100% 99% 100%  Weight:      Height:       No intake or output data in the 24 hours ending 08/07/23 1447    08/07/2023   12:24 PM 08/07/2023   10:44 AM 07/20/2023    8:12 AM  Last 3 Weights  Weight (lbs) 212 lb 216 lb 231 lb  Weight (kg) 96.163 kg 97.977 kg 104.781 kg     Body mass index is 29.57 kg/m.  General:  Well nourished, well developed, in no acute distress HEENT: normal Neck: no JVD Vascular: No carotid bruits; Distal pulses 2+ bilaterally Cardiac:  normal S1, S2; RRR; no murmur  Lungs:  clear to auscultation bilaterally, no wheezing, rhonchi or rales  Abd: soft, nontender, no hepatomegaly  Ext: no edema Musculoskeletal:  No deformities, BUE and BLE strength normal and equal Skin: warm and dry  Neuro:  CNs 2-12 intact, no focal abnormalities noted Psych:  Normal affect   EKG:  The EKG was personally reviewed and demonstrates:  Atrial flutter with 4:1 conduction, ventricular rate 67bpm Telemetry:  Telemetry was personally reviewed and demonstrates:  fixed rate atrial flutter with 4:1 conduction. Rate 67bpm  Relevant CV Studies: 10/29/22 TTE  IMPRESSIONS     1. Left ventricular ejection fraction, by estimation, is 50 to 55%. Left  ventricular ejection fraction by PLAX is 54 %. The left ventricle has low  normal function. The left ventricle has no regional wall motion  abnormalities. There is mild concentric   left ventricular hypertrophy. Left ventricular diastolic parameters are  consistent with Grade I diastolic dysfunction (impaired relaxation). There  is moderate hypokinesis of the left ventricular, basal-mid inferior wall.   2. Right ventricular systolic function is mildly reduced. The right  ventricular size is mildly enlarged. There is normal pulmonary artery  systolic pressure. The estimated right ventricular systolic pressure is  22.0 mmHg.   3. Possible atrial level shunting consistent with recent ablation. cannot  exclude a small PFO.   4. The mitral valve is normal in structure. Trivial mitral valve  regurgitation.   5. The aortic valve is tricuspid. Aortic valve regurgitation is not  visualized.   6. There is borderline dilatation of the ascending aorta, measuring 39  mm.   7. The inferior vena cava is normal in size with greater than 50%  respiratory variability, suggesting right atrial pressure of 3 mmHg.   Comparison(s): Changes from prior study are noted. 01/19/2021 (TEE): LVEF  50%, inferior wall hypokinesis.   Laboratory Data: High Sensitivity Troponin:   Recent Labs  Lab 08/07/23 1233  TROPONINIHS 14     Chemistry Recent Labs  Lab 08/07/23 1233  NA 139  K 4.3  CL 106  CO2 20*  GLUCOSE 113*  BUN 15  CREATININE 1.22  CALCIUM  9.2  GFRNONAA >60  ANIONGAP 13    No results for input(s): "PROT", "ALBUMIN ", "  AST", "ALT", "ALKPHOS", "BILITOT" in the last 168 hours. Lipids No results for input(s): "CHOL", "TRIG", "HDL", "LABVLDL", "LDLCALC", "CHOLHDL" in the last 168 hours.  Hematology Recent Labs  Lab 08/07/23 1233  WBC 8.5  RBC 6.62*  HGB 17.2*  HCT 54.5*  MCV 82.3  MCH 26.0  MCHC 31.6  RDW 18.5*  PLT 242   Thyroid No results for input(s): "TSH", "FREET4" in the last 168 hours.  BNPNo results for input(s): "BNP", "PROBNP" in the last 168 hours.  DDimer No results for input(s): "DDIMER" in the last 168 hours.  Radiology/Studies:  DG Chest 2  View Result Date: 08/07/2023 CLINICAL DATA:  Chest palpiations EXAM: CHEST - 2 VIEW COMPARISON:  December 04, 2022 FINDINGS: No focal airspace consolidation, pleural effusion, or pneumothorax. No cardiomegaly. Sternotomy wires and CABG markers. No acute fracture or destructive lesion. Multilevel degenerative disc disease of the spine. Cervical fusion hardware again noted. IMPRESSION: No acute cardiopulmonary abnormality. Electronically Signed   By: Rance Burrows M.D.   On: 08/07/2023 14:03     Assessment and Plan:  Atrial flutter with 4:1 conduction Patient with history of post-operative afib and SVT s/p ablation reporst ~2 weeks of palpitations/flutter sensation accompanied by decreased exertional tolerance. PCP ECG today noted atrial flutter and patient was sent to the ED. ECG in the ED is unchanged from clinic.  Given stable rates and minimal symptoms, favor 3 weeks of uninterrupted OAC with plans for outpatient DCCV. Start Edoxaban  60mg  daily. Due to no local availability, will temporarily put patient on Eliquis  (has rash history with this but is agreeable to take as a bridge).  Continue Toprol  XL 12.5mg  daily Afib clinic follow up scheduled in 2 weeks. Will tentatively schedule DCCV in 3 weeks. Continue ASA only, stop Plavix   CAD Patient s/p CABG 01/19/21 with LIMA to LAD, SVG to OM1, SVG to first diagonal, and SVG to PDA. No recent chest discomfort. Non ischemic ECG today without elevated troponin. Continue ASA 81mg  only, stop Plavix  with DOAC Continue Toprol  XL 12.5mg   Hyperlipidemia Continue Repatha  and Zetia .      Risk Assessment/Risk Scores:         CHA2DS2-VASc Score =     This indicates a  % annual risk of stroke. The patient's score is based upon:          For questions or updates, please contact [facility] Please consult [email or web address] for contact info under    Signed, Leala Prince, PA-C  08/07/2023 2:47 PM

## 2023-08-08 ENCOUNTER — Other Ambulatory Visit: Payer: Self-pay | Admitting: Cardiology

## 2023-08-08 ENCOUNTER — Other Ambulatory Visit: Payer: Self-pay | Admitting: Family Medicine

## 2023-08-08 DIAGNOSIS — F5101 Primary insomnia: Secondary | ICD-10-CM

## 2023-08-08 MED ORDER — EDOXABAN TOSYLATE 60 MG PO TABS: 60.0000 mg | ORAL_TABLET | Freq: Every day | ORAL | 2 refills | Status: DC

## 2023-08-08 NOTE — Telephone Encounter (Signed)
 Duplicate refill request, see refill request encounter yesterday from Interface Surescripts with the following message: Allana Ishikawa, CMA routed this conversation to Mercy Stall, MD   08/07/23  1:30 PM Interface, Surescripts Out routed this conversation to Mamatha Sirivol Rx Refill

## 2023-08-08 NOTE — Telephone Encounter (Signed)
 Copied from CRM [id number]. Topic: Clinical - Medication Refill >> Aug 08, 2023 11:43 AM Ivette P wrote: Medication: Eszopiclone  3 MG TABS  Has the patient contacted their pharmacy? Yes (Agent: If no, request that the patient contact the pharmacy for the refill. If patient does not wish to contact the pharmacy document the reason why and proceed with request.) (Agent: If yes, when and what did the pharmacy advise?)  This is the patient's preferred pharmacy:  [id number] - [place], [place] - [address] AT [facility] [address] [place] [address] Phone: [phone] Fax: [phone]   Is this the correct pharmacy for this prescription? Yes If no, delete pharmacy and type the correct one.   Has the prescription been filled recently? No, 06/05/2023  Is the patient out of the medication? Yes  Has the patient been seen for an appointment in the last year OR does the patient have an upcoming appointment? Yes, 08/07/2023  Can we respond through MyChart? Yes  Agent: Please be advised that Rx refills may take up to 3 business days. We ask that you follow-up with your pharmacy.

## 2023-08-08 NOTE — Telephone Encounter (Signed)
 Split HTA Siegfried Dress: [id number] (exp. 07/25/23 to 10/23/23)

## 2023-08-09 ENCOUNTER — Other Ambulatory Visit: Payer: Self-pay

## 2023-08-09 DIAGNOSIS — F5101 Primary insomnia: Secondary | ICD-10-CM

## 2023-08-09 MED ORDER — ESZOPICLONE 3 MG PO TABS: 3.0000 mg | ORAL_TABLET | Freq: Every day | ORAL | 1 refills | Status: DC

## 2023-08-10 NOTE — Telephone Encounter (Signed)
 LVM for pt to call back to schedule.

## 2023-08-15 NOTE — Telephone Encounter (Signed)
 I called the patient he did not pick up I left a voicemail.

## 2023-08-22 ENCOUNTER — Other Ambulatory Visit (HOSPITAL_COMMUNITY): Payer: Self-pay

## 2023-08-22 ENCOUNTER — Ambulatory Visit (HOSPITAL_COMMUNITY)
Admission: RE | Admit: 2023-08-22 | Discharge: 2023-08-22 | Disposition: A | Discharge status: Home / Self Care | Source: Ambulatory Visit | Attending: Internal Medicine | Admitting: Internal Medicine

## 2023-08-22 ENCOUNTER — Ambulatory Visit (HOSPITAL_COMMUNITY): Admitting: Internal Medicine

## 2023-08-22 VITALS — BP 150/90 | HR 83 | Ht 71.0 in | Wt 223.6 lb

## 2023-08-22 DIAGNOSIS — I4819 Other persistent atrial fibrillation: Secondary | ICD-10-CM

## 2023-08-22 DIAGNOSIS — I4891 Unspecified atrial fibrillation: Secondary | ICD-10-CM

## 2023-08-22 DIAGNOSIS — D6869 Other thrombophilia: Secondary | ICD-10-CM

## 2023-08-22 DIAGNOSIS — I483 Typical atrial flutter: Secondary | ICD-10-CM

## 2023-08-22 MED ORDER — EDOXABAN TOSYLATE 60 MG PO TABS
60.0000 mg | ORAL_TABLET | Freq: Every day | ORAL | 2 refills | Status: DC
  Filled 2023-08-22: qty 30, 30d supply, fill #0

## 2023-08-22 NOTE — Progress Notes (Incomplete)
 Primary Care Physician: Mercy Stall, MD Primary Cardiologist: Sheryle Donning, MD Electrophysiologist: Efraim Grange, MD  {Click to update primary MD,subspecialty MD or APP then REFRESH:1}   Referring Physician: Leala Prince, PA-C  {Removed [facility], PMH, PSH, ALLERGY, CMED, and SOC :1}   Jacob Collier is a 68 y.o. male with a history of CAD s/p DES and CABG, AVNRT s/p ablation, PE, post-op Afib, HTN, HLD, and atrial flutter who presents for consultation in the [facility]. ED visit on 08/07/23 for atrial flutter and patient was scheduled for outpatient DCCV on 6/25 due to minimal symptoms while in ED (referred to ED from PCP). Patient is on Savaysa  60 mg daily for a CHADS2VASC score of 3.  On evaluation today, he is currently in ***.   Today, he denies symptoms of ***palpitations, chest pain, shortness of breath, orthopnea, PND, lower extremity edema, dizziness, presyncope, syncope, snoring, daytime somnolence, bleeding, or neurologic sequela. The patient is tolerating medications without difficulties and is otherwise without complaint today.    Atrial Fibrillation Risk Factors:  he {Action; does/does not:19097} have symptoms or diagnosis of sleep apnea. he {ACTION; IS/IS [id number]} compliant with CPAP therapy. he {Action; does/does not:19097} have a history of rheumatic fever. he {Action; does/does not:19097} have a history of alcohol use. The patient {Action; does/does not:19097} have a history of early familial atrial fibrillation or other arrhythmias.  he has a BMI of There is no height or weight on file to calculate BMI.. There were no vitals filed for this visit.  Current Outpatient Medications  Medication Sig Dispense Refill   amLODipine  (NORVASC ) 5 MG tablet TAKE 1 TABLET BY MOUTH EVERYDAY AT BEDTIME (Patient taking differently: Take 2.5-5 mg by mouth 2 (two) times daily. Take 1 tablet (5mg ) by mouth in the morning and 1/2 tablet (2.5mg )  at night.) 90 tablet 3   aspirin  EC 81 MG tablet Take 81 mg by mouth daily. Swallow whole.     chlorpheniramine-HYDROcodone  (TUSSIONEX) 10-8 MG/5ML Take 5 mLs by mouth every 12 (twelve) hours as needed for cough. (Patient not taking: Reported on 08/07/2023) 115 mL 0   clonazePAM  (KLONOPIN ) 1 MG tablet TAKE 1 TABLET BY MOUTH TWICE A DAY AS NEEDED FOR ANXIETY 60 tablet 3   Cyanocobalamin  (VITAMIN B-12 PO) Take 1 tablet by mouth every other day.     doxycycline  (VIBRA -TABS) 100 MG tablet Take 1 tablet (100 mg total) by mouth 2 (two) times daily. (Patient not taking: Reported on 08/07/2023) 20 tablet 0   edoxaban  (SAVAYSA ) 60 MG TABS tablet Take 60 mg by mouth daily. To switch from Eliquis , please take Savaysa  12 hours following your last Eliquis  dose. Savaysa  should then be taken once daily at the same time of day. 30 tablet 2   Eszopiclone  3 MG TABS Take 1 tablet (3 mg total) by mouth at bedtime. Take immediately before bedtime 30 tablet 1   Evolocumab  (REPATHA  SURECLICK) 140 MG/ML SOAJ INJECT 140 MG INTO THE SKIN EVERY 14 (FOURTEEN) DAYS. (Patient taking differently: Inject 140 mg into the skin See admin instructions. Inject 140mg  into the skin every other Saturday.) 6 mL 3   ezetimibe  (ZETIA ) 10 MG tablet TAKE 1 TABLET BY MOUTH EVERY DAY 90 tablet 2   ferrous sulfate 325 (65 FE) MG tablet Take 325 mg by mouth every other day.     linaclotide  (LINZESS ) 290 MCG CAPS capsule Take 1 capsule (290 mcg total) by mouth daily before breakfast. 30 capsule 3  metoprolol  succinate (TOPROL -XL) 25 MG 24 hr tablet Take 12.5 mg by mouth at bedtime.     Omega-3 Fatty Acids (FISH OIL PO) Take 1 capsule by mouth at bedtime.     No current facility-administered medications for this visit.    Atrial Fibrillation Management history:  Previous antiarrhythmic drugs: none Previous cardioversions: none Previous ablations: SVT ablation 10/20/22 Anticoagulation history: Eliquis , Savaysa    ROS- All systems are reviewed and  negative except as per the HPI above.  Physical Exam: There were no vitals taken for this visit.  GEN: Well nourished, well developed in no acute distress NECK: No JVD; No carotid bruits CARDIAC: {EPRHYTHM:[id number]}, no murmurs, rubs, gallops RESPIRATORY:  Clear to auscultation without rales, wheezing or rhonchi  ABDOMEN: Soft, non-tender, non-distended EXTREMITIES:  No edema; No deformity   EKG today demonstrates ***  Echo 10/29/22 demonstrated  1. Left ventricular ejection fraction, by estimation, is 50 to 55%. Left  ventricular ejection fraction by PLAX is 54 %. The left ventricle has low  normal function. The left ventricle has no regional wall motion  abnormalities. There is mild concentric  left ventricular hypertrophy. Left ventricular diastolic parameters are  consistent with Grade I diastolic dysfunction (impaired relaxation). There  is moderate hypokinesis of the left ventricular, basal-mid inferior wall.   2. Right ventricular systolic function is mildly reduced. The right  ventricular size is mildly enlarged. There is normal pulmonary artery  systolic pressure. The estimated right ventricular systolic pressure is  22.0 mmHg.   3. Possible atrial level shunting consistent with recent ablation. cannot  exclude a small PFO.   4. The mitral valve is normal in structure. Trivial mitral valve  regurgitation.   5. The aortic valve is tricuspid. Aortic valve regurgitation is not  visualized.   6. There is borderline dilatation of the ascending aorta, measuring 39  mm.   7. The inferior vena cava is normal in size with greater than 50%  respiratory variability, suggesting right atrial pressure of 3 mmHg.   Comparison(s): Changes from prior study are noted. 01/19/2021 (TEE): LVEF  50%, inferior wall hypokinesis.   ASSESSMENT & PLAN CHA2DS2-VASc Score = 2  The patient's score is based upon: CHF History: 0 HTN History: 0 Diabetes History: 0 Stroke History: 0 Vascular  Disease History: 1 Age Score: 1 Gender Score: 0   {Confirm score is correct.  If not, click here to update score.  REFRESH note.  :1}   Chdas 3 with CAD HTN  ASSESSMENT AND PLAN: {Select the correct AFib Diagnosis                 :[id number]}  Persistent  He is currently in ***.  No missed doses of Savaysa  60 mg daily.   Follow up as scheduled for DCCV.    Minnie Amber, PA-C  Afib Clinic [facility] [address] [place], [place] [address] [phone]

## 2023-08-22 NOTE — H&P (View-Only) (Signed)
 Primary Care Physician: Mercy Stall, MD Primary Cardiologist: Sheryle Donning, MD Electrophysiologist: Efraim Grange, MD     Referring Physician: Leala Prince, PA-C     Jacob Collier is a 68 y.o. male with a history of CAD s/p DES and CABG, AVNRT s/p ablation, PE, post-op Afib, HTN, HLD, and atrial flutter who presents for consultation in the [facility]. ED visit on 08/07/23 for atrial flutter and patient was scheduled for outpatient DCCV on 6/25 due to minimal symptoms while in ED (referred to ED from PCP). Patient is on Savaysa  60 mg daily for a CHADS2VASC score of 3.  On evaluation today, he is currently in atrial flutter. Patient has not missed a dose of Eliquis . He would like to switch to Savaysa  60 mg daily but cannot find it. He is itching from the Eliquis .    Today, he denies symptoms of palpitations, chest pain, shortness of breath, orthopnea, PND, lower extremity edema, dizziness, presyncope, syncope, snoring, daytime somnolence, bleeding, or neurologic sequela. The patient is tolerating medications without difficulties and is otherwise without complaint today.    he has a BMI of Body mass index is 31.19 kg/m.Jacob Collier Filed Weights   08/22/23 1048  Weight: 101.4 kg    Current Outpatient Medications  Medication Sig Dispense Refill   amLODipine  (NORVASC ) 5 MG tablet TAKE 1 TABLET BY MOUTH EVERYDAY AT BEDTIME (Patient taking differently: Taking 5 mg in the morning and 1/2 tablet at bedtime) 90 tablet 3   apixaban  (ELIQUIS ) 5 MG TABS tablet Take 5 mg by mouth 2 (two) times daily.     aspirin  EC 81 MG tablet Take 81 mg by mouth daily. Swallow whole.     chlorpheniramine-HYDROcodone  (TUSSIONEX) 10-8 MG/5ML Take 5 mLs by mouth every 12 (twelve) hours as needed for cough. 115 mL 0   clonazePAM  (KLONOPIN ) 1 MG tablet TAKE 1 TABLET BY MOUTH TWICE A DAY AS NEEDED FOR ANXIETY 60 tablet 3   Cyanocobalamin  (VITAMIN B-12 PO) Take 1 tablet by mouth every  other day.     Eszopiclone  3 MG TABS Take 1 tablet (3 mg total) by mouth at bedtime. Take immediately before bedtime 30 tablet 1   Evolocumab  (REPATHA  SURECLICK) 140 MG/ML SOAJ INJECT 140 MG INTO THE SKIN EVERY 14 (FOURTEEN) DAYS. 6 mL 3   ezetimibe  (ZETIA ) 10 MG tablet TAKE 1 TABLET BY MOUTH EVERY DAY 90 tablet 2   ferrous sulfate 325 (65 FE) MG tablet Take 325 mg by mouth every other day.     linaclotide  (LINZESS ) 290 MCG CAPS capsule Take 1 capsule (290 mcg total) by mouth daily before breakfast. (Patient taking differently: Take 290 mcg by mouth as needed.) 30 capsule 3   metoprolol  succinate (TOPROL -XL) 25 MG 24 hr tablet Take 12.5 mg by mouth at bedtime.     Omega-3 Fatty Acids (FISH OIL PO) Take 1 capsule by mouth at bedtime.     edoxaban  (SAVAYSA ) 60 MG TABS tablet Take 60 mg by mouth daily. To switch from Eliquis , please take Savaysa  12 hours following your last Eliquis  dose. Savaysa  should then be taken once daily at the same time of day. (Patient not taking: Reported on 08/22/2023) 30 tablet 2   No current facility-administered medications for this encounter.    Atrial Fibrillation Management history:  Previous antiarrhythmic drugs: none Previous cardioversions: none Previous ablations: SVT ablation 10/20/22 Anticoagulation history: Eliquis , Savaysa    ROS- All systems are reviewed and negative except as per the HPI  above.  Physical Exam: BP (!) 150/90   Pulse 83   Ht 5' 11 (1.803 m)   Wt 101.4 kg   BMI 31.19 kg/m   GEN: Well nourished, well developed in no acute distress NECK: No JVD; No carotid bruits CARDIAC: Irregularly irregular rate and rhythm, no murmurs, rubs, gallops RESPIRATORY:  Clear to auscultation without rales, wheezing or rhonchi  ABDOMEN: Soft, non-tender, non-distended EXTREMITIES:  No edema; No deformity   EKG today demonstrates  Vent. rate 83 BPM PR interval * ms QRS duration 100 ms QT/QTcB 410/481 ms P-R-T axes 64 106 -27 Atrial flutter with  variable A-V block Rightward axis Inferior infarct (cited on or before 07-Aug-2023) Cannot rule out Anterior infarct (cited on or before 07-Aug-2023) Abnormal ECG When compared with ECG of 07-Aug-2023 12:19, Serial changes of Anterior infarct Present  Echo 10/29/22 demonstrated  1. Left ventricular ejection fraction, by estimation, is 50 to 55%. Left  ventricular ejection fraction by PLAX is 54 %. The left ventricle has low  normal function. The left ventricle has no regional wall motion  abnormalities. There is mild concentric  left ventricular hypertrophy. Left ventricular diastolic parameters are  consistent with Grade I diastolic dysfunction (impaired relaxation). There  is moderate hypokinesis of the left ventricular, basal-mid inferior wall.   2. Right ventricular systolic function is mildly reduced. The right  ventricular size is mildly enlarged. There is normal pulmonary artery  systolic pressure. The estimated right ventricular systolic pressure is  22.0 mmHg.   3. Possible atrial level shunting consistent with recent ablation. cannot  exclude a small PFO.   4. The mitral valve is normal in structure. Trivial mitral valve  regurgitation.   5. The aortic valve is tricuspid. Aortic valve regurgitation is not  visualized.   6. There is borderline dilatation of the ascending aorta, measuring 39  mm.   7. The inferior vena cava is normal in size with greater than 50%  respiratory variability, suggesting right atrial pressure of 3 mmHg.   Comparison(s): Changes from prior study are noted. 01/19/2021 (TEE): LVEF  50%, inferior wall hypokinesis.   ASSESSMENT & PLAN CHA2DS2-VASc Score = 3  The patient's score is based upon: CHF History: 0 HTN History: 1 Diabetes History: 0 Stroke History: 0 Vascular Disease History: 1 Age Score: 1 Gender Score: 0       ASSESSMENT AND PLAN: Persistent Atrial Flutter (ICD10:  I48.19) The patient's CHA2DS2-VASc score is 3, indicating a  3.2% annual risk of stroke.    He is in atrial flutter. He notes if he has early return of atrial flutter he would like to discuss atrial flutter ablation with Dr. Arlester Ladd. This is reasonable for patient to come off anticoagulation after flutter ablation.   Informed Consent   Shared Decision Making/Informed Consent The risks (stroke, cardiac arrhythmias rarely resulting in the need for a temporary or permanent pacemaker, skin irritation or burns and complications associated with conscious sedation including aspiration, arrhythmia, respiratory failure and death), benefits (restoration of normal sinus rhythm) and alternatives of a direct current cardioversion were explained in detail to Jacob Collier and he agrees to proceed.      Secondary Hypercoagulable State (ICD10:  D68.69) The patient is at significant risk for stroke/thromboembolism based upon his CHA2DS2-VASc Score of 3.  Continue Apixaban  (Eliquis ).   Continue Eliquis  5 mg BID.     Follow up as scheduled for DCCV.    Minnie Amber, PA-C  [facility] [address]  [place], [place] [address] [phone]

## 2023-08-22 NOTE — Progress Notes (Signed)
 Primary Care Physician: Mercy Stall, MD Primary Cardiologist: Sheryle Donning, MD Electrophysiologist: Efraim Grange, MD     Referring Physician: Leala Prince, PA-C     Jacob Collier is a 68 y.o. male with a history of CAD s/p DES and CABG, AVNRT s/p ablation, PE, post-op Afib, HTN, HLD, and atrial flutter who presents for consultation in the [facility]. ED visit on 08/07/23 for atrial flutter and patient was scheduled for outpatient DCCV on 6/25 due to minimal symptoms while in ED (referred to ED from PCP). Patient is on Savaysa  60 mg daily for a CHADS2VASC score of 3.  On evaluation today, he is currently in atrial flutter. Patient has not missed a dose of Eliquis . He would like to switch to Savaysa  60 mg daily but cannot find it. He is itching from the Eliquis .    Today, he denies symptoms of palpitations, chest pain, shortness of breath, orthopnea, PND, lower extremity edema, dizziness, presyncope, syncope, snoring, daytime somnolence, bleeding, or neurologic sequela. The patient is tolerating medications without difficulties and is otherwise without complaint today.    he has a BMI of Body mass index is 31.19 kg/m.Jacob Collier Filed Weights   08/22/23 1048  Weight: 101.4 kg    Current Outpatient Medications  Medication Sig Dispense Refill   amLODipine  (NORVASC ) 5 MG tablet TAKE 1 TABLET BY MOUTH EVERYDAY AT BEDTIME (Patient taking differently: Taking 5 mg in the morning and 1/2 tablet at bedtime) 90 tablet 3   apixaban  (ELIQUIS ) 5 MG TABS tablet Take 5 mg by mouth 2 (two) times daily.     aspirin  EC 81 MG tablet Take 81 mg by mouth daily. Swallow whole.     chlorpheniramine-HYDROcodone  (TUSSIONEX) 10-8 MG/5ML Take 5 mLs by mouth every 12 (twelve) hours as needed for cough. 115 mL 0   clonazePAM  (KLONOPIN ) 1 MG tablet TAKE 1 TABLET BY MOUTH TWICE A DAY AS NEEDED FOR ANXIETY 60 tablet 3   Cyanocobalamin  (VITAMIN B-12 PO) Take 1 tablet by mouth every  other day.     Eszopiclone  3 MG TABS Take 1 tablet (3 mg total) by mouth at bedtime. Take immediately before bedtime 30 tablet 1   Evolocumab  (REPATHA  SURECLICK) 140 MG/ML SOAJ INJECT 140 MG INTO THE SKIN EVERY 14 (FOURTEEN) DAYS. 6 mL 3   ezetimibe  (ZETIA ) 10 MG tablet TAKE 1 TABLET BY MOUTH EVERY DAY 90 tablet 2   ferrous sulfate 325 (65 FE) MG tablet Take 325 mg by mouth every other day.     linaclotide  (LINZESS ) 290 MCG CAPS capsule Take 1 capsule (290 mcg total) by mouth daily before breakfast. (Patient taking differently: Take 290 mcg by mouth as needed.) 30 capsule 3   metoprolol  succinate (TOPROL -XL) 25 MG 24 hr tablet Take 12.5 mg by mouth at bedtime.     Omega-3 Fatty Acids (FISH OIL PO) Take 1 capsule by mouth at bedtime.     edoxaban  (SAVAYSA ) 60 MG TABS tablet Take 60 mg by mouth daily. To switch from Eliquis , please take Savaysa  12 hours following your last Eliquis  dose. Savaysa  should then be taken once daily at the same time of day. (Patient not taking: Reported on 08/22/2023) 30 tablet 2   No current facility-administered medications for this encounter.    Atrial Fibrillation Management history:  Previous antiarrhythmic drugs: none Previous cardioversions: none Previous ablations: SVT ablation 10/20/22 Anticoagulation history: Eliquis , Savaysa    ROS- All systems are reviewed and negative except as per the HPI  above.  Physical Exam: BP (!) 150/90   Pulse 83   Ht 5' 11 (1.803 m)   Wt 101.4 kg   BMI 31.19 kg/m   GEN: Well nourished, well developed in no acute distress NECK: No JVD; No carotid bruits CARDIAC: Irregularly irregular rate and rhythm, no murmurs, rubs, gallops RESPIRATORY:  Clear to auscultation without rales, wheezing or rhonchi  ABDOMEN: Soft, non-tender, non-distended EXTREMITIES:  No edema; No deformity   EKG today demonstrates  Vent. rate 83 BPM PR interval * ms QRS duration 100 ms QT/QTcB 410/481 ms P-R-T axes 64 106 -27 Atrial flutter with  variable A-V block Rightward axis Inferior infarct (cited on or before 07-Aug-2023) Cannot rule out Anterior infarct (cited on or before 07-Aug-2023) Abnormal ECG When compared with ECG of 07-Aug-2023 12:19, Serial changes of Anterior infarct Present  Echo 10/29/22 demonstrated  1. Left ventricular ejection fraction, by estimation, is 50 to 55%. Left  ventricular ejection fraction by PLAX is 54 %. The left ventricle has low  normal function. The left ventricle has no regional wall motion  abnormalities. There is mild concentric  left ventricular hypertrophy. Left ventricular diastolic parameters are  consistent with Grade I diastolic dysfunction (impaired relaxation). There  is moderate hypokinesis of the left ventricular, basal-mid inferior wall.   2. Right ventricular systolic function is mildly reduced. The right  ventricular size is mildly enlarged. There is normal pulmonary artery  systolic pressure. The estimated right ventricular systolic pressure is  22.0 mmHg.   3. Possible atrial level shunting consistent with recent ablation. cannot  exclude a small PFO.   4. The mitral valve is normal in structure. Trivial mitral valve  regurgitation.   5. The aortic valve is tricuspid. Aortic valve regurgitation is not  visualized.   6. There is borderline dilatation of the ascending aorta, measuring 39  mm.   7. The inferior vena cava is normal in size with greater than 50%  respiratory variability, suggesting right atrial pressure of 3 mmHg.   Comparison(s): Changes from prior study are noted. 01/19/2021 (TEE): LVEF  50%, inferior wall hypokinesis.   ASSESSMENT & PLAN CHA2DS2-VASc Score = 3  The patient's score is based upon: CHF History: 0 HTN History: 1 Diabetes History: 0 Stroke History: 0 Vascular Disease History: 1 Age Score: 1 Gender Score: 0       ASSESSMENT AND PLAN: Persistent Atrial Flutter (ICD10:  I48.19) The patient's CHA2DS2-VASc score is 3, indicating a  3.2% annual risk of stroke.    He is in atrial flutter. He notes if he has early return of atrial flutter he would like to discuss atrial flutter ablation with Dr. Arlester Ladd. This is reasonable for patient to come off anticoagulation after flutter ablation.   Informed Consent   Shared Decision Making/Informed Consent The risks (stroke, cardiac arrhythmias rarely resulting in the need for a temporary or permanent pacemaker, skin irritation or burns and complications associated with conscious sedation including aspiration, arrhythmia, respiratory failure and death), benefits (restoration of normal sinus rhythm) and alternatives of a direct current cardioversion were explained in detail to Mr. Berns and he agrees to proceed.      Secondary Hypercoagulable State (ICD10:  D68.69) The patient is at significant risk for stroke/thromboembolism based upon his CHA2DS2-VASc Score of 3.  Continue Apixaban  (Eliquis ).   Continue Eliquis  5 mg BID.     Follow up as scheduled for DCCV.    Minnie Amber, PA-C  [facility] [address]  [place], [place] [address] [phone]

## 2023-08-23 ENCOUNTER — Other Ambulatory Visit (HOSPITAL_COMMUNITY): Payer: Self-pay

## 2023-08-24 ENCOUNTER — Other Ambulatory Visit (HOSPITAL_COMMUNITY): Payer: Self-pay

## 2023-08-24 ENCOUNTER — Other Ambulatory Visit: Payer: Self-pay | Admitting: Physician Assistant

## 2023-08-24 DIAGNOSIS — K5792 Diverticulitis of intestine, part unspecified, without perforation or abscess without bleeding: Secondary | ICD-10-CM

## 2023-08-24 MED ORDER — APIXABAN 5 MG PO TABS: 5.0000 mg | ORAL_TABLET | Freq: Two times a day (BID) | ORAL | Status: DC

## 2023-08-28 ENCOUNTER — Other Ambulatory Visit: Payer: Self-pay | Admitting: Cardiovascular Disease

## 2023-08-28 ENCOUNTER — Ambulatory Visit (HOSPITAL_COMMUNITY): Admitting: Internal Medicine

## 2023-08-28 NOTE — Telephone Encounter (Signed)
 Spoke to the patient.  Split HTA shara: [id number] (exp. 07/25/23 to 10/23/23)   He is scheduled at [facility] for 09/05/23 at 8 pm.  Mailed packet and sent mychart

## 2023-08-29 NOTE — Anesthesia Preprocedure Evaluation (Signed)
 Anesthesia Evaluation  Patient identified by MRN, date of birth, ID band Patient awake    Reviewed: Allergy & Precautions, NPO status , Patient's Chart, lab work & pertinent test results  History of Anesthesia Complications Negative for: history of anesthetic complications  Airway Mallampati: III  TM Distance: >3 FB Neck ROM: Full   Comment: Previous grade I view with Glidescope, easy mask Dental  (+) Dental Advisory Given   Pulmonary shortness of breath, sleep apnea , neg COPD, neg recent URI   Pulmonary exam normal breath sounds clear to auscultation       Cardiovascular hypertension (amlodipine , metoprolol ), Pt. on medications and Pt. on home beta blockers (-) angina + CAD, + Cardiac Stents (01/2019) and + CABG (01/2021)  + dysrhythmias (PVCs) Atrial Fibrillation and Supra Ventricular Tachycardia + Valvular Problems/Murmurs  Rhythm:Irregular Rate:Normal  HLD  TTE 10/29/2022: IMPRESSIONS    1. Left ventricular ejection fraction, by estimation, is 50 to 55%. Left  ventricular ejection fraction by PLAX is 54 %. The left ventricle has low  normal function. The left ventricle has no regional wall motion  abnormalities. There is mild concentric  left ventricular hypertrophy. Left ventricular diastolic parameters are  consistent with Grade I diastolic dysfunction (impaired relaxation). There  is moderate hypokinesis of the left ventricular, basal-mid inferior wall.   2. Right ventricular systolic function is mildly reduced. The right  ventricular size is mildly enlarged. There is normal pulmonary artery  systolic pressure. The estimated right ventricular systolic pressure is  22.0 mmHg.   3. Possible atrial level shunting consistent with recent ablation. cannot  exclude a small PFO.   4. The mitral valve is normal in structure. Trivial mitral valve  regurgitation.   5. The aortic valve is tricuspid. Aortic valve regurgitation is  not  visualized.   6. There is borderline dilatation of the ascending aorta, measuring 39  mm.   7. The inferior vena cava is normal in size with greater than 50%  respiratory variability, suggesting right atrial pressure of 3 mmHg.     Neuro/Psych neg Seizures PSYCHIATRIC DISORDERS (claustrophobia) Anxiety      Neuromuscular disease (thoracic and lumbar radiculopathy, s/p ACDF)    GI/Hepatic Neg liver ROS,neg GERD  ,,Diverticulosis, s/p partial colectomy   Endo/Other  negative endocrine ROS    Renal/GU negative Renal ROS   Prostate cancer    Musculoskeletal  (+) Arthritis , Osteoarthritis and Rheumatoid disorders,    Abdominal   Peds  Hematology  (+) Blood dyscrasia (polycythemia) Lab Results      Component                Value               Date                      WBC                      8.5                 08/07/2023                HGB                      17.2 (H)            08/07/2023                HCT  54.5 (H)            08/07/2023                MCV                      82.3                08/07/2023                PLT                      242                 08/07/2023              Anesthesia Other Findings   Reproductive/Obstetrics                             Anesthesia Physical Anesthesia Plan  ASA: 3  Anesthesia Plan: General   Post-op Pain Management: Minimal or no pain anticipated   Induction: Intravenous  PONV Risk Score and Plan: 2 and TIVA and Treatment may vary due to age or medical condition  Airway Management Planned: Natural Airway and Nasal Cannula  Additional Equipment:   Intra-op Plan:   Post-operative Plan:   Informed Consent: I have reviewed the patients History and Physical, chart, labs and discussed the procedure including the risks, benefits and alternatives for the proposed anesthesia with the patient or authorized representative who has indicated his/her understanding and  acceptance.     Dental advisory given  Plan Discussed with: CRNA and Anesthesiologist  Anesthesia Plan Comments: (Risks of general anesthesia discussed including, but not limited to, sore throat, hoarse voice, chipped/damaged teeth, injury to vocal cords, nausea and vomiting, allergic reactions, lung infection, heart attack, stroke, and death. All questions answered. )       Anesthesia Quick Evaluation

## 2023-08-29 NOTE — Progress Notes (Signed)
 Spoke to patient and instructed them to come at [facility]  and to be NPO after 0000.  Medications reviewed.    Confirmed that patient will have a ride home and someone to stay with them for 24 hours after the procedure.

## 2023-08-30 ENCOUNTER — Encounter (HOSPITAL_COMMUNITY): Payer: Self-pay | Admitting: Cardiovascular Disease

## 2023-08-30 ENCOUNTER — Ambulatory Visit (HOSPITAL_BASED_OUTPATIENT_CLINIC_OR_DEPARTMENT_OTHER): Payer: Self-pay | Admitting: Anesthesiology

## 2023-08-30 ENCOUNTER — Ambulatory Visit (HOSPITAL_COMMUNITY)
Admission: RE | Admit: 2023-08-30 | Discharge: 2023-08-30 | Disposition: A | Discharge status: Home / Self Care | Attending: Cardiovascular Disease | Admitting: Cardiovascular Disease

## 2023-08-30 ENCOUNTER — Ambulatory Visit (HOSPITAL_COMMUNITY): Payer: Self-pay | Admitting: Anesthesiology

## 2023-08-30 ENCOUNTER — Telehealth: Payer: Self-pay | Admitting: Pharmacy Technician

## 2023-08-30 ENCOUNTER — Other Ambulatory Visit: Payer: Self-pay

## 2023-08-30 ENCOUNTER — Other Ambulatory Visit (HOSPITAL_COMMUNITY): Payer: Self-pay

## 2023-08-30 ENCOUNTER — Encounter (HOSPITAL_COMMUNITY)
Admission: RE | Disposition: A | Discharge status: Home / Self Care | Payer: Self-pay | Source: Home / Self Care | Attending: Cardiovascular Disease

## 2023-08-30 ENCOUNTER — Telehealth: Payer: Self-pay | Admitting: Pharmacist Clinician (PhC)/ Clinical Pharmacy Specialist

## 2023-08-30 DIAGNOSIS — I483 Typical atrial flutter: Secondary | ICD-10-CM | POA: Diagnosis not present

## 2023-08-30 DIAGNOSIS — I2510 Atherosclerotic heart disease of native coronary artery without angina pectoris: Secondary | ICD-10-CM | POA: Diagnosis not present

## 2023-08-30 DIAGNOSIS — D6869 Other thrombophilia: Secondary | ICD-10-CM | POA: Diagnosis not present

## 2023-08-30 DIAGNOSIS — I4819 Other persistent atrial fibrillation: Secondary | ICD-10-CM | POA: Diagnosis not present

## 2023-08-30 DIAGNOSIS — Z7901 Long term (current) use of anticoagulants: Secondary | ICD-10-CM | POA: Insufficient documentation

## 2023-08-30 DIAGNOSIS — I10 Essential (primary) hypertension: Secondary | ICD-10-CM

## 2023-08-30 DIAGNOSIS — Z955 Presence of coronary angioplasty implant and graft: Secondary | ICD-10-CM | POA: Insufficient documentation

## 2023-08-30 DIAGNOSIS — I480 Paroxysmal atrial fibrillation: Secondary | ICD-10-CM

## 2023-08-30 DIAGNOSIS — Z951 Presence of aortocoronary bypass graft: Secondary | ICD-10-CM | POA: Insufficient documentation

## 2023-08-30 DIAGNOSIS — Z86711 Personal history of pulmonary embolism: Secondary | ICD-10-CM | POA: Insufficient documentation

## 2023-08-30 DIAGNOSIS — I4892 Unspecified atrial flutter: Secondary | ICD-10-CM | POA: Insufficient documentation

## 2023-08-30 DIAGNOSIS — E785 Hyperlipidemia, unspecified: Secondary | ICD-10-CM

## 2023-08-30 HISTORY — PX: CARDIOVERSION: EP1203

## 2023-08-30 SURGERY — CARDIOVERSION (CATH LAB)
Anesthesia: General

## 2023-08-30 MED ORDER — EDOXABAN TOSYLATE 60 MG PO TABS: 60.0000 mg | ORAL_TABLET | Freq: Every day | ORAL | 2 refills | Status: DC

## 2023-08-30 MED ORDER — PROPOFOL 10 MG/ML IV BOLUS
INTRAVENOUS | Status: DC | PRN
  Administered 2023-08-30: 80 mg via INTRAVENOUS
  Administered 2023-08-30: 20 mg via INTRAVENOUS

## 2023-08-30 MED ORDER — SODIUM CHLORIDE 0.9% FLUSH
3.0000 mL | Freq: Two times a day (BID) | INTRAVENOUS | Status: DC
  Administered 2023-08-30: 10 mL via INTRAVENOUS

## 2023-08-30 MED ORDER — APIXABAN 5 MG PO TABS: 5.0000 mg | ORAL_TABLET | Freq: Two times a day (BID) | ORAL | Status: DC

## 2023-08-30 MED ORDER — SODIUM CHLORIDE 0.9% FLUSH: 3.0000 mL | INTRAVENOUS | Status: DC | PRN

## 2023-08-30 MED ORDER — LIDOCAINE 2% (20 MG/ML) 5 ML SYRINGE
INTRAMUSCULAR | Status: DC | PRN
  Administered 2023-08-30: 60 mg via INTRAVENOUS

## 2023-08-30 SURGICAL SUPPLY — 1 items: PAD DEFIB RADIO PHYSIO CONN (PAD) ×1 IMPLANT

## 2023-08-30 NOTE — Anesthesia Postprocedure Evaluation (Signed)
 Anesthesia Post Note  Patient: Jacob Collier  Procedure(s) Performed: CARDIOVERSION     Patient location during evaluation: PACU Anesthesia Type: General Level of consciousness: awake Pain management: pain level controlled Vital Signs Assessment: post-procedure vital signs reviewed and stable Respiratory status: spontaneous breathing, nonlabored ventilation and respiratory function stable Cardiovascular status: blood pressure returned to baseline and stable Postop Assessment: no apparent nausea or vomiting Anesthetic complications: no   No notable events documented.  Last Vitals:  Vitals:   08/30/23 0845 08/30/23 0850  BP: 118/82 122/78  Pulse: 64 70  Resp: (!) 21 (!) 21  Temp:    SpO2: 92% 96%    Last Pain:  Vitals:   08/30/23 0845  TempSrc:   PainSc: Asleep                 [name]

## 2023-08-30 NOTE — CV Procedure (Signed)
 Electrical Cardioversion Procedure Note Jacob Collier [id number] 12-Apr-1955  Procedure: Electrical Cardioversion Indications:  Atrial Flutter  Procedure Details Consent: Risks of procedure as well as the alternatives and risks of each were explained to the (patient/caregiver).  Consent for procedure obtained. Time Out: Verified patient identification, verified procedure, site/side was marked, verified correct patient position, special equipment/implants available, medications/allergies/relevent history reviewed, required imaging and test results available.  Performed  Patient placed on cardiac monitor, pulse oximetry, supplemental oxygen as necessary.  Sedation given: propofol  Pacer pads placed anterior and posterior chest.  Cardioverted 1 time(s).  Cardioverted at 200J.  Evaluation Findings: Post procedure EKG shows: NSR Complications: None Patient did tolerate procedure well.   Annabella Scarce, MD 08/30/2023, 8:46 AM

## 2023-08-30 NOTE — Telephone Encounter (Signed)
 Please do PA for Savaysa  60 mg once daily.  Patient has had allergic reaction to both Eliquis  and Xarelto .  Dx Afib

## 2023-08-30 NOTE — Interval H&P Note (Signed)
 History and Physical Interval Note:  08/30/2023 8:25 AM  Evalene LITTIE Novak  has presented today for surgery, with the diagnosis of aflutter.  The various methods of treatment have been discussed with the patient and family. After consideration of risks, benefits and other options for treatment, the patient has consented to  Procedure(s): CARDIOVERSION (N/A) as a surgical intervention.  The patient's history has been reviewed, patient examined, no change in status, stable for surgery.  I have reviewed the patient's chart and labs.  Questions were answered to the patient's satisfaction.     Annabella Scarce, MD

## 2023-08-30 NOTE — Interval H&P Note (Signed)
 History and Physical Interval Note:  08/30/2023 8:34 AM  Jacob Collier  has presented today for surgery, with the diagnosis of aflutter.  The various methods of treatment have been discussed with the patient and family. After consideration of risks, benefits and other options for treatment, the patient has consented to  Procedure(s): CARDIOVERSION (N/A) as a surgical intervention.  The patient's history has been reviewed, patient examined, no change in status, stable for surgery.  I have reviewed the patient's chart and labs.  Questions were answered to the patient's satisfaction.     Annabella Scarce, MD

## 2023-08-30 NOTE — Transfer of Care (Signed)
 Immediate Anesthesia Transfer of Care Note  Patient: Jacob Collier  Procedure(s) Performed: CARDIOVERSION  Patient Location: Cath Lab  Anesthesia Type:General  Level of Consciousness: drowsy and responds to stimulation  Airway & Oxygen Therapy: Patient Spontanous Breathing  Post-op Assessment: Report given to RN and Post -op Vital signs reviewed and stable  Post vital signs: Reviewed and stable  Last Vitals:  Vitals Value Taken Time  BP 116/86 (96)   Temp    Pulse 67   Resp 23   SpO2 94     Last Pain:  Vitals:   08/30/23 0751  TempSrc:   PainSc: 0-No pain         Complications: No notable events documented.
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
## 2023-09-05 ENCOUNTER — Encounter: Payer: Self-pay | Admitting: Internal Medicine

## 2023-09-05 ENCOUNTER — Ambulatory Visit (INDEPENDENT_AMBULATORY_CARE_PROVIDER_SITE_OTHER): Admitting: Neurology

## 2023-09-05 ENCOUNTER — Ambulatory Visit: Payer: Managed Care, Other (non HMO) | Attending: Internal Medicine | Admitting: Internal Medicine

## 2023-09-05 VITALS — BP 124/73 | HR 64 | Resp 14 | Ht 71.0 in | Wt 213.0 lb

## 2023-09-05 DIAGNOSIS — R5383 Other fatigue: Secondary | ICD-10-CM

## 2023-09-05 DIAGNOSIS — G5601 Carpal tunnel syndrome, right upper limb: Secondary | ICD-10-CM | POA: Diagnosis not present

## 2023-09-05 DIAGNOSIS — G4730 Sleep apnea, unspecified: Secondary | ICD-10-CM

## 2023-09-05 DIAGNOSIS — M05741 Rheumatoid arthritis with rheumatoid factor of right hand without organ or systems involvement: Secondary | ICD-10-CM

## 2023-09-05 DIAGNOSIS — M05742 Rheumatoid arthritis with rheumatoid factor of left hand without organ or systems involvement: Secondary | ICD-10-CM | POA: Diagnosis not present

## 2023-09-05 DIAGNOSIS — J22 Unspecified acute lower respiratory infection: Secondary | ICD-10-CM

## 2023-09-05 DIAGNOSIS — G8929 Other chronic pain: Secondary | ICD-10-CM | POA: Diagnosis not present

## 2023-09-05 DIAGNOSIS — M545 Low back pain, unspecified: Secondary | ICD-10-CM | POA: Diagnosis not present

## 2023-09-05 DIAGNOSIS — C61 Malignant neoplasm of prostate: Secondary | ICD-10-CM | POA: Diagnosis not present

## 2023-09-05 DIAGNOSIS — M130 Polyarthritis, unspecified: Secondary | ICD-10-CM | POA: Diagnosis not present

## 2023-09-05 DIAGNOSIS — D751 Secondary polycythemia: Secondary | ICD-10-CM

## 2023-09-05 DIAGNOSIS — R0609 Other forms of dyspnea: Secondary | ICD-10-CM

## 2023-09-05 DIAGNOSIS — Z86711 Personal history of pulmonary embolism: Secondary | ICD-10-CM

## 2023-09-05 DIAGNOSIS — I200 Unstable angina: Secondary | ICD-10-CM

## 2023-09-05 DIAGNOSIS — Z951 Presence of aortocoronary bypass graft: Secondary | ICD-10-CM

## 2023-09-05 DIAGNOSIS — H903 Sensorineural hearing loss, bilateral: Secondary | ICD-10-CM

## 2023-09-07 LAB — SEDIMENTATION RATE: Sed Rate: 2 mm/h (ref 0–20)

## 2023-09-07 LAB — PROTEIN ELECTROPHORESIS, SERUM, WITH REFLEX
Albumin ELP: 4 g/dL (ref 3.8–4.8)
Alpha 1: 0.3 g/dL (ref 0.2–0.3)
Alpha 2: 0.9 g/dL (ref 0.5–0.9)
Beta 2: 0.3 g/dL (ref 0.2–0.5)
Beta Globulin: 0.4 g/dL (ref 0.4–0.6)
Gamma Globulin: 0.9 g/dL (ref 0.8–1.7)
Total Protein: 6.8 g/dL (ref 6.1–8.1)

## 2023-09-07 LAB — C-REACTIVE PROTEIN: CRP: 5.6 mg/L (ref <8.0)

## 2023-09-07 LAB — C3 AND C4
C3 Complement: 137 mg/dL (ref 82–185)
C4 Complement: 28 mg/dL (ref 15–53)

## 2023-09-07 LAB — RHEUMATOID FACTOR: Rheumatoid fact SerPl-aCnc: <10 [IU]/mL (ref <14)

## 2023-09-07 LAB — HLA-B27 ANTIGEN: HLA-B27 Antigen: NEGATIVE

## 2023-09-07 NOTE — Progress Notes (Addendum)
 Cardiology Office Note:    Date:  09/13/2023   ID:  Jacob Collier, DOB Aug 04, 1955, MRN [id number]  PCP:  Milon Cleaves, PA  Cardiologist:  Peter Swaziland, MD Cardiology APP:  Jadine Aline BRAVO, PA-C  Electrophysiologist:  Eulas BRAVO Furbish, MD     Referring MD: Sherre Clapper, MD   Chief Complaint: hospital follow-up atrial flutter  History of Present Illness:    Jacob Collier is a 68 y.o. male with a history of CAD s/p DES to RCA in 01/2019 and then CABG x4 (LIMA to LAD, reverse SVG to PDA, reverse SVG to OM1, and reverse SVG to 1st Diag) in 01/2021, left pleural effusion following CABG s/p thoracentesis, post-op atrial fibrillation after CABG, recent diagnosis of atrial flutter s/p DCCV on 08/30/2023 on Savaysa , paroxysmal SVT s/p ablation in 10/2022, PE in 10/2022, hypertension, hyperlipidemia intolerant to statins, anxiety, prior prostate cancer, and polycythemia who is followed by Dr. Burnard and presents today for follow-up of atrial flutter.  Patient has a known history of CAD and was previously followed by Cardiology at [facility] and now follows with Dr. Burnard. Reviewed records in Care Everywhere. Cardiac catheterization in 01/2019 at [facility] showed CTO of LCX and RCA. Patient underwent successful PCI with DES to proximal to mid RCA lesion. Zio monitor was ordered in 02/2019 which showed frequent PVC (8.5% burden) and non-sustained VT. Myoview  was therefore ordered to rule out ischemia as the cause. This was done in 05/2019 showed inferolateral wall defect with associated wall motion abnormality compatible with prior MI. He was started on Ranexa but was unable to tolerate this. Patient presented to [facility] later that month with chest pain. Echo showed LVEF of 55-60% with akinesis of the basal inferior and inferoseptal wall and hypokinesis of inferolateral walls. He underwent repeat cardiac catheterization which showed instent CTO of RCA. Medical therapy was recommended with  consideration for CABG if unsatisfactory response with optimization of antianginals.    He presented to [facility] ED in 06/2019 with palpitations and tightness in his neck and was found to be in SVT with rates in the 200s but converted back to sinus rhythm on his own. He was started on Toprol -XL and Event Monitor was ordered which showed underlying sinus rhythm with average heart rate of 65 bpm and occasional PVCs/ventricular couplets, one 4 beat episodes of non-sustained VT, and a 36 second episode of SVT with rates in the 170s. There were no pauses or evidence of atrial fibrillation. Toprol -XL was increased.   He underwent repeat cardiac catheterization in due to progressive angina which showed severe 3 vessel CAD with a new 75% mid LAD lesion compared to prior cath in 2021. He  was referred to CT surgery for consideration of CABG. Echo showed LVEF of 55% with hypokinesis of antero-lateral wall, posterior wall, and basal inferior segment as well as grade 2 diastolic dysfunction, mild MR, and mild dilatation of the ascending aorta measuring 41 mm. He ultimately underwent CABG x4 with LIMA to LAD, reverse SVG to PDA, reverse SVG to OM1, and reverse SVG to 1st Diag with Dr. Shyrl in 01/2021. Urology placed a foley catheter prior to surgery given history of bulbar urethral stricture and this was kept throughout hospitalization and at time of discharge. He did develop post-op atrial fibrillation and was started on IV Amiodarone  and converted back to sinus rhythm. He was discharged on PO Amiodarone  but was not started on anticoagulation. Post-op course also complicated by bladder spasms  from indwelling foley which was treated with Oxybutin and phlebitis due to IV infiltration of Amiodarone  which was treated with Keflex . Following discharge, he also required thoracentesis on 02/02/2021 for left pleural effusion. He thankfully had significant improvement in symptoms and quality of life following surgery and was able to  get back to working over 40 hours per week.   A 2 week Zio monitor was ordered in 04/2022 for further evaluation of palpitations and showed predominantly sinus rhythm with 2 short episodes of NSVT (longest run 4 beats) and several episodes of SVT (longest episode lasting about 14.5 minutes) as well as occasional PACs but no atrial fibrillation. He was ultimately referred to EP and underwent SVT ablation in 10/2022.  He was subsequently admitted in 10/2022 for acute hypoxic respiratory failure secondary to acute PE. Echo showed LVEF of 50-55% with moderate hypokinesis of the basal-mid inferior wall, mild LVH, and grade 1 diastolic dysfunction. Lower extremity dopplers were negative for DVT. Plavix  was stopped and he was started on Eliquis  with plans to continue this for 3 months. However, he could not tolerate Eliquis  or Xarelto  and was ultimately switched to Edoxaban .   He was recently seen in the ED in 08/2023 for new onset atrial flutter with 4:1 AV conduction after this was seen at PCPs office. Given he was rate controlled with only minimally symptomatic, plan was for 3 weeks of anticoagulation and then outpatient DCCV. He was restarted on Edoxaban . He was bridged with Eliquis  given no local availability of Edoxaban . He underwent successful DCCV on 08/30/2023.   Patient presents today for follow-up. Here with his wife.  Patient is feeling much better since his cardioversion.  He states he felt terrible while he was in atrial flutter.  He still feels a little fatigued but is feeling better each day.  He denies any chest pain, shortness of breath, orthopnea, PND, edema.  No recurrent palpitations since cardiac.  No lightheadedness, dizziness, syncope.  No abnormal bleeding on anticoagulation.  He underwent an in lab sleep study last week and was told he stopped breathing around 30 times.  Full report still pending.  He is very interested in undergoing an atrial flutter ablation.  EKGs/Labs/Other Studies  Reviewed:    The following studies were reviewed:  Event Monitor 07/15/2019 to 08/13/2019: The patient was monitored for 30 days from Jul 15, 2019 through August 13, 2019.  The predominant rhythm was sinus rhythm with an average rate at 65 bpm.  The slowest heart rate was sinus bradycardia at 42 bpm which occurred on May 16 at 10:40 AM.  The fastest heart rate was sinus tachycardia at 164 bpm which occurred on May 24 at 1:32 PM.  The patient had occasional PVCs with a ventricular couplet, as well as several episodes of ventricular bigeminal rhythm.  There was an episode of 4 beats of nonsustained ventricular tachycardia on Jul 18, 2019 at 6:58 PM at 192 bpm.  There was a 36-second episode of supraventricular tachycardia with PACs at 174 bpm.  There were no pauses.  There was no episodes of atrial fibrillation. _______________   Left Cardiac Catheterization 12/24/2020:   Prox LAD to Mid LAD lesion is 75% stenosed.   1st Diag lesion is 80% stenosed.   Prox Cx to Mid Cx lesion is 100% stenosed.   Prox RCA to Mid RCA lesion is 100% stenosed.   The left ventricular systolic function is normal.   LV end diastolic pressure is mildly elevated.   The left ventricular  ejection fraction is 50-55% by visual estimate.   There is no aortic valve stenosis.   3 vessel obstructive CAD. 75% mid LAD, 80% first diagonal, 100% mid LCx and 100% proximal RCA. Compared to prior cath in March 2021 the LAD stenosis is new.  Mild LV dysfunction with inferobasal HK. EF 50% Mildly elevated LVEDP   Plan: needs CT surgery evaluation for CABG. Will hold Plavix . Plan to refer to Pharm D for PCSK 9 inhibitor.    Diagnostic Dominance: Right  _______________   Echocardiogram 01/15/2021: Impressions: 1. Left ventricular ejection fraction, by estimation, is 55%. The left  ventricle has normal function. The left ventricle demonstrates regional  wall motion abnormalities (see scoring diagram/findings for description).  There is  mild left ventricular  hypertrophy. Left ventricular diastolic parameters are consistent with  Grade II diastolic dysfunction (pseudonormalization).   2. Right ventricular systolic function is normal. The right ventricular  size is normal.   3. Left atrial size was mildly dilated.   4. The mitral valve is grossly normal. Mild mitral valve regurgitation.  No evidence of mitral stenosis.   5. The aortic valve is tricuspid. There is mild calcification of the  aortic valve. Aortic valve regurgitation is not visualized. No aortic  stenosis is present.   6. Aortic dilatation noted. There is mild dilatation of the ascending  aorta, measuring 41 mm.   7. The inferior vena cava is normal in size with greater than 50%  respiratory variability, suggesting right atrial pressure of 3 mmHg.  _______________   Pre-CABG Dopplers 01/15/2021: Summary:  - Right Carotid: Velocities in the right ICA are consistent with a 1-39%  stenosis.  - Left Carotid: Velocities in the left ICA are consistent with a 1-39%  stenosis.  - Vertebrals: Bilateral vertebral arteries demonstrate antegrade flow.   - Right Upper Extremity: Doppler waveforms remain within normal limits with right radial compression. Doppler waveforms remain within normal limits with right ulnar compression.  - Left Upper Extremity: Doppler waveforms remain within normal limits with left radial compression. Doppler waveforms decrease 50% with left ulnar compression. _______________  Monitor 04/10/2022 to 04/22/2022: Patient had a min HR of 47 bpm, max HR of 187 bpm, and avg HR of 69 bpm. Predominant underlying rhythm was Sinus Rhythm. First Degree AV Block was present. QRS morphology changes were present throughout recording. 2 Ventricular Tachycardia runs occurred,  the run with the fastest interval lasting 4 beats with a max rate of 150 bpm, the longest lasting 4 beats with an avg rate of 144 bpm. 111 Supraventricular Tachycardia runs occurred, the  run with the fastest interval lasting 14 mins 39 secs with a max  rate of 187 bpm (avg 145 bpm); the run with the fastest interval was also the longest. True duration of Supraventricular Tachycardia difficult to ascertain due to artifact. Isolated SVEs were occasional (2.0%, 19072), SVE Couplets were rare (<1.0%, 671),  and SVE Triplets were rare (<1.0%, 113). Isolated VEs were rare (<1.0%, 3281), VE Couplets were rare (<1.0%, 183), and VE Triplets were rare (<1.0%, 14).   The predominant rhythm was normal sinus rhythm with an average rate at 68 bpm and heart rate range at 47 to 99 bpm.  Slowest heart rate at 47 bpm occurred at 4:59 AM on April 14, 2022 while the patient was sleeping.  There were 2 episodes of 4 beats of nonsustained VT with average rate at 144.  There are occasional episodes of SVT with the fastest interval lasting 14 minutes  and 39 seconds with average rate at 145 and maximum rate 187.  There were occasional episodes of isolated PACs with rare couplets.  Isolated PVCs were rare.  There were no episodes of atrial fibrillation or pauses. _______________  Echocardiogram 10/29/2022: Impressions: 1. Left ventricular ejection fraction, by estimation, is 50 to 55%. Left  ventricular ejection fraction by PLAX is 54 %. The left ventricle has low  normal function. The left ventricle has no regional wall motion  abnormalities. There is mild concentric  left ventricular hypertrophy. Left ventricular diastolic parameters are  consistent with Grade I diastolic dysfunction (impaired relaxation). There  is moderate hypokinesis of the left ventricular, basal-mid inferior wall.   2. Right ventricular systolic function is mildly reduced. The right  ventricular size is mildly enlarged. There is normal pulmonary artery  systolic pressure. The estimated right ventricular systolic pressure is  22.0 mmHg.   3. Possible atrial level shunting consistent with recent ablation. cannot  exclude a small  PFO.   4. The mitral valve is normal in structure. Trivial mitral valve  regurgitation.   5. The aortic valve is tricuspid. Aortic valve regurgitation is not  visualized.   6. There is borderline dilatation of the ascending aorta, measuring 39  mm.   7. The inferior vena cava is normal in size with greater than 50%  respiratory variability, suggesting right atrial pressure of 3 mmHg.   EKG:  EKG ordered today.   EKG Interpretation Date/Time:  Wednesday September 13 2023 08:52:02 EDT Ventricular Rate:  58 PR Interval:  176 QRS Duration:  98 QT Interval:  446 QTC Calculation: 437 R Axis:   44  Text Interpretation: Sinus bradycardia Cannot rule out Anterior infarct (cited on or before 07-Aug-2023) When compared with ECG of 30-Aug-2023 08:51, No significant changes compared to prior tracing Confirmed by Miquela Costabile [phone]) on 09/13/2023 9:01:57 AM    Recent Labs: 10/28/2022: B Natriuretic Peptide 40.3 11/02/2022: Magnesium  2.2 05/11/2023: ALT 33 08/07/2023: BUN 15; Creatinine, Ser 1.22; Hemoglobin 17.2; Platelets 242; Potassium 4.3; Sodium 139  Recent Lipid Panel    Component Value Date/Time   CHOL 85 (L) 05/11/2023 1646   TRIG 114 05/11/2023 1646   HDL 39 (L) 05/11/2023 1646   CHOLHDL 2.2 05/11/2023 1646   CHOLHDL 4.8 06/06/2019 0612   VLDL 29 06/06/2019 0612   LDLCALC 25 05/11/2023 1646    Physical Exam:    Vital Signs: BP 123/62   Pulse (!) 58   Ht 5' 11 (1.803 m)   Wt 221 lb 9.6 oz (100.5 kg)   SpO2 98%   BMI 30.91 kg/m     Wt Readings from Last 3 Encounters:  09/13/23 221 lb 9.6 oz (100.5 kg)  09/05/23 213 lb (96.6 kg)  08/22/23 223 lb 9.6 oz (101.4 kg)     General: 68 y.o. Caucasian male in no acute distress. HEENT: Normocephalic and atraumatic. Sclera clear.  Neck: Supple. No carotid bruits. No JVD. Heart: RRR. Distinct S1 and S2. No murmurs, gallops, or rubs.  Lungs: No increased work of breathing. Clear to ausculation bilaterally. No wheezes, rhonchi, or  rales.  Extremities: No lower extremity edema.   Skin: Warm and dry. Neuro: No focal deficits. Psych: Normal affect. Responds appropriately.  Assessment:    1. Coronary artery disease involving native coronary artery of native heart without angina pectoris   2. S/P CABG (coronary artery bypass graft)   3. Post-op atrial fibrillation (HCC)   4. Atrial flutter, unspecified type (HCC)  5. Paroxysmal SVT (supraventricular tachycardia) (HCC)   6. Primary hypertension   7. Hyperlipidemia, unspecified hyperlipidemia type     Plan:    CAD s/p CABG History of CAD s/p DES  to CTO of RCA in 01/2021 and then repeat DES for in-stent restenosis in 05/2019. He had chronic angina following this and ultimately underwent CABG x4 LIMA to LAD, reverse SVG to PDA, reverse SVG to OM1, and reverse SVG to 1st Diag in 01/2021. - No chest pain.  - Continue Toprol -XL 12.5mg  daily. - Continue DAPT with Aspirin  and  Plavix . - Intolerant to multiple statins. Continue Zetia  and Repatha .  Post-Op Atrial fibrillation Persistent Atrial Flutter Patient has a history of post-op atrial fibrillation following CABG in 01/2021. He had no recurrence until recently when he was diagnosed with atrial flutter with 4:1 AV conduction in 08/2023. S/p successful DCCV on 08/30/2023.  - EKG today shows sinus rhythm with rate of 58 bpm. - No recurrent palpitations since DCCV. - Continue Toprol -XL 12.5mg  daily.  - Currently on anticoagulation with Savaysa  60mg  daily.  However, this is going to cost him $166 per month which is not doable.  Previously unable to tolerate Eliquis  and Xarelto .  He absolutely does not want to use Coumadin.  I reached out to our pharmacy team.  We do not think there is any patient assistance for Savaysa  but will double check.  The other option is Pradaxa  150 mg twice daily.  This is generic but is not on formulary for patient's insurance.  We are going to submit prior authorization for Pradaxa  brand name and  then see if we could do patient assistance for this. Patient has about 15 tablets let of the Savaysa  which gives us  a little time to figure this out. - He is very interested in having an atrial flutter ablation. Will get him back to see Dr. Nancey to discuss this.  Paroxysmal SVT S/p ablation in 10/2022.  - Continue Toprol -XL 12.5mg  daily. He also has a history of bradycardia which has limited our ability to up-titrate this.   Hypertension BP well controlled. - Continue Toprol -XL 12.5mg  daily.  - Continue Amlodipine . After visit, realized that he reported taking 5mg  during the day and 7.5mg  in the evening. Max dose is 10mg  daily. Will send him a MyChart message and recommend decreasing to 5mg  twice daily.   Hyperlipidemia Lipid panel in 05/2023: Total Cholesterol 85, Triglycerides 114, HDL 39, LDL 25. LDL goal <55.  - Intolerant to multiple statins in the past including Lipitor , Crestor , and Pravastatin .  - Continue Zetia  10mg  daily and Repatha .   Disposition: Follow up with Dr. Nancey to discuss possible atrial flutter ablation. Follow up with General Cardiology in 6 months. He was previously followed by Dr. Burnard who has now retired. Dr. Lonni saw him during one of his recent ED visits and she is currently listed as his primary Cardiologist. However, he would prefer to stay at our [facility] office and does not want to go to [facility]. Will get him established with Dr. Swaziland who performed his last cardiac catheterization.    Bonney Aline FORBES Jadine, PA-C  09/13/2023 11:41 AM    [facility]  ADDENDUM 09/19/1013: Will stop Svaysa and switch to brand name Pradaxa  150mg  twice daily. Patient states he will be able to afford this.  Roopa Graver E Debar Plate, PA-C 09/20/2023 9:08 AM

## 2023-09-13 ENCOUNTER — Telehealth: Payer: Self-pay | Admitting: Pharmacy Technician

## 2023-09-13 ENCOUNTER — Encounter: Payer: Self-pay | Admitting: Student

## 2023-09-13 ENCOUNTER — Other Ambulatory Visit

## 2023-09-13 ENCOUNTER — Encounter: Payer: Self-pay | Admitting: Hematology and Oncology

## 2023-09-13 ENCOUNTER — Ambulatory Visit: Attending: Cardiovascular Disease | Admitting: Student

## 2023-09-13 ENCOUNTER — Other Ambulatory Visit (HOSPITAL_COMMUNITY): Payer: Self-pay

## 2023-09-13 ENCOUNTER — Ambulatory Visit: Admitting: Hematology and Oncology

## 2023-09-13 VITALS — BP 123/62 | HR 58 | Ht 71.0 in | Wt 221.6 lb

## 2023-09-13 DIAGNOSIS — E785 Hyperlipidemia, unspecified: Secondary | ICD-10-CM | POA: Diagnosis not present

## 2023-09-13 DIAGNOSIS — I471 Supraventricular tachycardia, unspecified: Secondary | ICD-10-CM | POA: Diagnosis not present

## 2023-09-13 DIAGNOSIS — I480 Paroxysmal atrial fibrillation: Secondary | ICD-10-CM

## 2023-09-13 DIAGNOSIS — Z951 Presence of aortocoronary bypass graft: Secondary | ICD-10-CM | POA: Diagnosis not present

## 2023-09-13 DIAGNOSIS — I2510 Atherosclerotic heart disease of native coronary artery without angina pectoris: Secondary | ICD-10-CM

## 2023-09-13 DIAGNOSIS — I4892 Unspecified atrial flutter: Secondary | ICD-10-CM | POA: Diagnosis not present

## 2023-09-13 DIAGNOSIS — I10 Essential (primary) hypertension: Secondary | ICD-10-CM | POA: Diagnosis not present

## 2023-09-13 NOTE — Telephone Encounter (Signed)
 Waiting to see if prior auth approved for pradaxa

## 2023-09-13 NOTE — Patient Instructions (Addendum)
 Medication Instructions:  No medication changes were made during today's visit.   Make an appointment to see Dr. Ricky Furbish.  *If you need a refill on your cardiac medications before your next appointment, please call your pharmacy*   Lab Work: No labs were ordered during today's visit.  If you have labs (blood work) drawn today and your tests are completely normal, you will receive your results only by: MyChart Message (if you have MyChart) OR A paper copy in the mail If you have any lab test that is abnormal or we need to change your treatment, we will call you to review the results.   Testing/Procedures: No procedures were ordered during today's visit.    Follow-Up: At [facility], you and your health needs are our priority.  As part of our continuing mission to provide you with exceptional heart care, we have created designated Provider Care Teams.  These Care Teams include your primary Cardiologist (physician) and Advanced Practice Providers (APPs -  Physician Assistants and Nurse Practitioners) who all work together to provide you with the care you need, when you need it.  We recommend signing up for the patient portal called MyChart.  Sign up information is provided on this After Visit Summary.  MyChart is used to connect with patients for Virtual Visits (Telemedicine).  Patients are able to view lab/test results, encounter notes, upcoming appointments, etc.  Non-urgent messages can be sent to your provider as well.   To learn more about what you can do with MyChart, go to [email or web address].    Your next appointment:   6 month(s)  Provider:   Dr. Peter Swaziland to establish care OR Aline Door    Other Instructions Thank you for choosing [facility]! A letter will be mailed to you as a reminder to call the office for your next follow up appointment.

## 2023-09-13 NOTE — Telephone Encounter (Signed)
 Pharmacy Patient Advocate Encounter   Received notification from [facility] provider that prior authorization for pradaxa 150mg  is required/requested.   Insurance verification completed.   The patient is insured through [employer] ADVANTAGE/RX ADVANCE .   Per test claim: PA required; PA submitted to above mentioned insurance via latent Key/confirmation #/EOC [id number] Status is pending   Latent   Patient is on Savaysa  but it is 166.00 for 30 days. Trying pa for pradaxa brand and then get pap. Non-formulary for pradaxa and generic per test claims. Let provider Callie know outcome. Patient has 15 days left of Savaysa 

## 2023-09-13 NOTE — Telephone Encounter (Signed)
 Pharmacy Patient Advocate Encounter  Received notification from [facility] ADVANTAGE/RX ADVANCE that Prior Authorization for pradaxa 150mg  has been APPROVED from 09/13/23 to 03/06/24. Ran test claim, Copay is $76.19. This test claim was processed through [facility]- copay amounts may vary at other pharmacies due to pharmacy/plan contracts, or as the patient moves through the different stages of their insurance plan.   PA #/Case ID/Reference #: [id number]    I called the patient and left him a message. Informed provider

## 2023-09-14 ENCOUNTER — Inpatient Hospital Stay: Payer: Self-pay

## 2023-09-14 ENCOUNTER — Inpatient Hospital Stay (HOSPITAL_BASED_OUTPATIENT_CLINIC_OR_DEPARTMENT_OTHER): Payer: Self-pay | Admitting: Hematology and Oncology

## 2023-09-14 DIAGNOSIS — D751 Secondary polycythemia: Secondary | ICD-10-CM

## 2023-09-14 NOTE — Telephone Encounter (Signed)
 Lmom for patient to call back about cost/pharmacy/assistance

## 2023-09-14 NOTE — Progress Notes (Signed)
 Reschedule

## 2023-09-14 NOTE — Telephone Encounter (Signed)
 Pa approved. Waiting to see if pt needs app mailed

## 2023-09-15 NOTE — Telephone Encounter (Signed)
 Lmom for patient to call back about cost/pharmacy/assistance

## 2023-09-17 ENCOUNTER — Encounter: Payer: Self-pay | Admitting: Hematology and Oncology

## 2023-09-17 NOTE — Progress Notes (Signed)
 [facility] [phone]   [phone]  PROGRESS NOTE  Patient Care Team: Milon Cleaves, [name] as PCP - General (Physician Assistant) Mealor, Eulas BRAVO, MD as PCP - Electrophysiology (Cardiology) [place], Peter M, MD as PCP - Cardiology (Cardiology) Carolee Sherwood JONETTA DOUGLAS, MD as Consulting Physician (Urology) Goodrich, Callie E, PA-C as Physician Assistant (Cardiology) Malcolm Toribio DASEN, MD as Referring Physician (Neurology)  Hematological/Oncological History # Polycythemia, secondary. Due to OSA/Exogenous Testosterone   06/11/2019: WBC 6.9, Hgb 15.5, MCV 91.4, Plt 219 04/01/2020: WBC 8.6, Hgb 18.4, MCV 87, Plt 219 08/03/2020: WBC 8.8, Hgb 19.3, MCV 88.7, Plt 187 09/09/2020: establish care with Dr. Federico. Hgb 18.2  03/17/2021: WBC 9.3, Hgb 14.6, MCV 88.8, Plt 170   Interval History:  Jacob Collier 68 y.o. male with medical history significant for secondary polycythemia who presents for a follow up visit. The patient's last visit was on 06/07/2023. In the interim since the last visit he underwent a cardioversion for atrial flutter.    On exam today Jacob Collier reports he was on anticoagulation for 4 weeks time and will likely require another ablation.  He reports that he was recommended the Pradaxa  therapy because he cannot take Eliquis  or Xarelto .  He notes that he did undergo a sleep study which revealed mild to moderate OSA.  He has been trying to lose weight in order to improve his sleep apnea and is down approximately 20 pounds.  He reports that he continues his testosterone  at lower than his usual dose.  Otherwise he has been his baseline level of health and has no questions concerns or complaints today.  MEDICAL HISTORY:  Past Medical History:  Diagnosis Date   Anxiety    Arthritis    Coronary artery disease    a. S/p DES to RCA in 01/2019 b. s/p CABG x4 (LIMA-LAD, reverse SVG-PDA, reverse SVG-OM1, reverse SVG-1st Diag) in 01/2021   Diverticulosis    Dyspnea     Hard of hearing    Hyperlipidemia    Hypertension    hx of elevation on lyrica , off med and now normal    Paroxysmal SVT (supraventricular tachycardia) (HCC)    Post-op atrial fibrillation    Prostate cancer (HCC)    Wears glasses     SURGICAL HISTORY: Past Surgical History:  Procedure Laterality Date   ANTERIOR CERVICAL DECOMP/DISCECTOMY FUSION N/A 09/03/2021   Procedure: CERVICAL FIVE-SIX, CERVICAL SIX-SEVEN ANTERIOR CERVICAL DECOMPRESSION/DISCECTOMY FUSION;  Surgeon: Louis Shove, MD;  Location: [facility] OR;  Service: Neurosurgery;  Laterality: N/A;   CARDIAC CATHETERIZATION     with 2 stents    CARDIOVERSION N/A 08/30/2023   Procedure: CARDIOVERSION;  Surgeon: Raford Riggs, MD;  Location: [facility] INVASIVE CV LAB;  Service: Cardiovascular;  Laterality: N/A;   COLECTOMY  05/2018   COLONOSCOPY     CORONARY ARTERY BYPASS GRAFT N/A 01/19/2021   Procedure: CORONARY ARTERY BYPASS GRAFTING (CABG), ON PUMP TIMES FOUR, USING LEFT INTERNAL MAMMARY ARTERY AND ENDOSOCPICALLY HARVESTED RIGHT GREATER SAPHENOUS VEIN;  Surgeon: Shyrl Linnie KIDD, MD;  Location: [facility] OR;  Service: Open Heart Surgery;  Laterality: N/A;   CYSTOSCOPY WITH URETHRAL DILATATION N/A 01/14/2021   Procedure: CYSTOSCOPY WITH BALLOON  DILATATION;  Surgeon: Gaston Hamilton, MD;  Location: [facility] ORS;  Service: Urology;  Laterality: N/A;   EYE SURGERY  02/2020   bilaterl cataracts   IR THORACENTESIS ASP PLEURAL SPACE W/IMG GUIDE  02/02/2021   LEFT HEART CATH AND CORONARY ANGIOGRAPHY N/A 12/24/2020   Procedure: LEFT HEART CATH  AND CORONARY ANGIOGRAPHY;  Surgeon: [place], Peter M, MD;  Location: [facility] INVASIVE CV LAB;  Service: Cardiovascular;  Laterality: N/A;   PROSTATECTOMY     SVT ABLATION N/A 10/20/2022   Procedure: SVT ABLATION;  Surgeon: Nancey Eulas BRAVO, MD;  Location: [facility] INVASIVE CV LAB;  Service: Cardiovascular;  Laterality: N/A;   TEE WITHOUT CARDIOVERSION N/A 01/19/2021   Procedure: TRANSESOPHAGEAL ECHOCARDIOGRAM (TEE);   Surgeon: Shyrl Linnie KIDD, MD;  Location: [facility] OR;  Service: Open Heart Surgery;  Laterality: N/A;   TOTAL HIP ARTHROPLASTY Left 11/10/2015   Procedure: LEFT TOTAL HIP ARTHROPLASTY ANTERIOR APPROACH;  Surgeon: Lonni CINDERELLA Poli, MD;  Location: [facility] OR;  Service: Orthopedics;  Laterality: Left;    SOCIAL HISTORY: Social History   Socioeconomic History   Marital status: Married    Spouse name: Not on file   Number of children: 0   Years of education: Not on file   Highest education level: Not on file  Occupational History   Occupation: Employed at [employer]  Tobacco Use   Smoking status: Never    Passive exposure: Never   Smokeless tobacco: Never  Vaping Use   Vaping status: Never Used  Substance and Sexual Activity   Alcohol use: Not Currently    Alcohol/week: 0.0 standard drinks of alcohol    Comment: rarely   Drug use: No   Sexual activity: Yes    Partners: Female    Birth control/protection: None  Other Topics Concern   Not on file  Social History Narrative   Not on file   Social Drivers of Health   Financial Resource Strain: Low Risk  (08/04/2022)   Overall Financial Resource Strain (CARDIA)    Difficulty of Paying Living Expenses: Not hard at all  Food Insecurity: No Food Insecurity (10/29/2022)   Hunger Vital Sign    Worried About Running Out of Food in the Last Year: Never true    Ran Out of Food in the Last Year: Never true  Transportation Needs: No Transportation Needs (10/29/2022)   PRAPARE - Administrator, Civil Service (Medical): No    Lack of Transportation (Non-Medical): No  Physical Activity: Sufficiently Active (08/04/2022)   Exercise Vital Sign    Days of Exercise per Week: 5 days    Minutes of Exercise per Session: 60 min  Stress: No Stress Concern Present (08/04/2022)   [employer] of Occupational Health - Occupational Stress Questionnaire    Feeling of Stress : Not at all  Social Connections: Moderately Isolated  (08/04/2022)   Social Connection and Isolation Panel    Frequency of Communication with Friends and Family: More than three times a week    Frequency of Social Gatherings with Friends and Family: More than three times a week    Attends Religious Services: Never    Database administrator or Organizations: No    Attends Banker Meetings: Never    Marital Status: Married  Catering manager Violence: Not At Risk (10/29/2022)   Humiliation, Afraid, Rape, and Kick questionnaire    Fear of Current or Ex-Partner: No    Emotionally Abused: No    Physically Abused: No    Sexually Abused: No    FAMILY HISTORY: Family History  Problem Relation Age of Onset   Alzheimer's disease Mother    Stroke Father    Diabetes Sister    Colon polyps Brother    Heart attack Maternal Grandmother    Cancer Maternal Uncle  prostate   Prostate cancer Maternal Uncle    Cancer Maternal Uncle        prostate   Prostate cancer Maternal Uncle    Colon cancer Neg Hx    Esophageal cancer Neg Hx    Rectal cancer Neg Hx    Stomach cancer Neg Hx     ALLERGIES:  is allergic to ranexa [ranolazine], amoxil  [amoxicillin ], lipitor  [atorvastatin ], pravachol  [pravastatin ], rosuvastatin , statins, eliquis  [apixaban ], and xarelto  [rivaroxaban ].  MEDICATIONS:  Current Outpatient Medications  Medication Sig Dispense Refill   amLODipine  (NORVASC ) 5 MG tablet Take 1 tablet (5mg ) in the morning and 1/2 tablet (2.5mg ) in the evening.     aspirin  EC 81 MG tablet Take 81 mg by mouth daily. Swallow whole.     clonazePAM  (KLONOPIN ) 1 MG tablet TAKE 1 TABLET BY MOUTH TWICE A DAY AS NEEDED FOR ANXIETY 60 tablet 3   dabigatran  (PRADAXA ) 150 MG CAPS capsule Take 1 capsule (150 mg total) by mouth 2 (two) times daily. 60 capsule 11   Evolocumab  (REPATHA  SURECLICK) 140 MG/ML SOAJ INJECT 140 MG INTO THE SKIN EVERY 14 (FOURTEEN) DAYS. 6 mL 3   ezetimibe  (ZETIA ) 10 MG tablet TAKE 1 TABLET BY MOUTH EVERY DAY 90 tablet 1    metoprolol  succinate (TOPROL -XL) 25 MG 24 hr tablet Take 12.5 mg by mouth at bedtime. (Patient taking differently: Take 12.5 mg by mouth daily.)     oxymetazoline (AFRIN) 0.05 % nasal spray Place 1 spray into both nostrils 2 (two) times daily as needed for congestion.     ramelteon  (ROZEREM ) 8 MG tablet Take 1 tablet (8 mg total) by mouth at bedtime. 30 tablet 0   No current facility-administered medications for this visit.    REVIEW OF SYSTEMS:   Constitutional: ( - ) fevers, ( - )  chills , ( - ) night sweats Eyes: ( - ) blurriness of vision, ( - ) double vision, ( - ) watery eyes Ears, nose, mouth, throat, and face: ( - ) mucositis, ( - ) sore throat Respiratory: ( - ) cough, ( - ) dyspnea, ( - ) wheezes Cardiovascular: ( - ) palpitation, ( - ) chest discomfort, ( - ) lower extremity swelling Gastrointestinal:  ( - ) nausea, ( - ) heartburn, ( - ) change in bowel habits Skin: ( - ) abnormal skin rashes Lymphatics: ( - ) new lymphadenopathy, ( - ) easy bruising Neurological: ( - ) numbness, ( - ) tingling, ( - ) new weaknesses Behavioral/Psych: ( - ) mood change, ( - ) new changes  All other systems were reviewed with the patient and are negative.  PHYSICAL EXAMINATION:  Vitals:   09/28/23 1120  BP: (!) 148/89  Pulse: 66  Resp: 13  Temp: 97.7 F (36.5 C)  SpO2: 97%      Filed Weights   09/28/23 1120  Weight: 213 lb 6.4 oz (96.8 kg)       GENERAL: Well-appearing elderly Caucasian male, alert, no distress and comfortable SKIN: skin color, texture, turgor are normal, no rashes or significant lesions EYES: conjunctiva are pink and non-injected, sclera clear LUNGS: clear to auscultation and percussion with normal breathing effort HEART: regular rate & rhythm and no murmurs and no lower extremity edema Musculoskeletal: no cyanosis of digits and no clubbing  PSYCH: alert & oriented x 3, fluent speech NEURO: no focal motor/sensory deficits  LABORATORY DATA:  I have  reviewed the data as listed    Latest Ref Rng & Units  09/28/2023   11:01 AM 08/07/2023   12:33 PM 06/07/2023    2:46 PM  CBC  WBC 4.0 - 10.5 K/uL 7.7  8.5  10.1   Hemoglobin 13.0 - 17.0 g/dL 81.9  82.7  84.8   Hematocrit 39.0 - 52.0 % 54.3  54.5  46.7   Platelets 150 - 400 K/uL 219  242  227        Latest Ref Rng & Units 09/05/2023    9:51 AM 08/07/2023   12:33 PM 05/11/2023    4:46 PM  CMP  Glucose 70 - 99 mg/dL  886  88   BUN 8 - 23 mg/dL  15  18   Creatinine 9.38 - 1.24 mg/dL  8.77  8.87   Sodium 864 - 145 mmol/L  139  137   Potassium 3.5 - 5.1 mmol/L  4.3  4.7   Chloride 98 - 111 mmol/L  106  104   CO2 22 - 32 mmol/L  20  19   Calcium  8.9 - 10.3 mg/dL  9.2  9.9   Total Protein 6.1 - 8.1 g/dL 6.8   7.3   Total Bilirubin 0.0 - 1.2 mg/dL   0.6   Alkaline Phos 44 - 121 IU/L   107   AST 0 - 40 IU/L   23   ALT 0 - 44 IU/L   33     RADIOGRAPHIC STUDIES: No results found.   ASSESSMENT & PLAN Jacob Collier is a 68 y.o.  male with medical history significant for secondary polycythemia who presents for a follow up visit.   After review of the labs, review of the records, and discussion with the patient the patients findings are most consistent with a secondary polycythemia.  The patient has 2 possible sources including obstructive sleep apnea or exogenous testosterone  use.  Polysomnography was ordered but not performed in the interim since her last visit.  He also had a CABG procedure after which time his apnea symptoms resolved, possibly due to marked weight loss.  At this time his hemoglobin has normalized and the patient has restarted his testosterone  shots.  I have once again expressed to him my recommendation the testosterone  shots are not advisable in the setting of polycythemia and his prior history of prostate cancer.  He notes that he will consider polysomnography testing if his hemoglobin were to increase at the next visit.  # Polycythemia, Secondary to OSA and Exogenous  Testosterone  -- Findings at this time are most consistent with a secondary polycythemia due to either obstructive sleep apnea or exogenous testosterone  injections. --Hgb today normalized at 14.6, having dropped from after his CABG surgery in Nov 2022.  --negative JAK2 with reflex and BCR/ABL FISH panels at last visit.  --We discussed how it is controversial to perform phlebotomies outside the setting of polycythemia vera.  The patient reports that he feels much better and would be willing to talk with his sleep medicine doctor, in the interim we will perform phlebotomies as a temporizing measure to see if this improves some of his symptoms. PLAN: --Labs today show white blood cell 7.7, hemoglobin 18.0, hematocrit 54.3%, platelets 219  --Hgb above target at 54.3%, recommend phlebotomy as soon as is feasible.  -- Patient has a diagnosis of OSA and has not started the CPAP machine.  He is trying to lose weight to improve his symptoms. --Return to clinic in 6 months with repeat labs   #Prostate Cancer --s/p prostatectomy in 2015, biochemical recurrence treated  in 2018 with radiation therapy --currently follows with [facility] -- Discussed with patient that testosterone  shots in the setting of history of prostate cancer is not advisable. -- Defer management to urology, but patient wanted us  to continue to monitoring for this disease would be happy to oblige.  No orders of the defined types were placed in this encounter.   All questions were answered. The patient knows to call the clinic with any problems, questions or concerns.  A total of more than 30 minutes were spent on this encounter with face-to-face time and non-face-to-face time, including preparing to see the patient, ordering tests and/or medications, counseling the patient and coordination of care as outlined above.   Norleen IVAR Kidney, MD Department of Hematology/Oncology [facility] Phone:  [phone] Pager: [phone] Email: [email or web address]  10/10/2023 8:27 PM

## 2023-09-18 NOTE — Telephone Encounter (Addendum)
 Lmom to see if patient is ok with cost. Pradaxa  no longer has assistance

## 2023-09-18 NOTE — Telephone Encounter (Signed)
 Pradaxa  no longer has assistance

## 2023-09-20 MED ORDER — DABIGATRAN ETEXILATE MESYLATE 150 MG PO CAPS: 150.0000 mg | ORAL_CAPSULE | Freq: Two times a day (BID) | ORAL | 11 refills | Status: DC

## 2023-09-20 NOTE — Telephone Encounter (Signed)
 Yes ma'am, will do. Thanks for all your help with this!  Best, Yaresly Menzel

## 2023-09-20 NOTE — Addendum Note (Signed)
 Addended by: Taite Baldassari on: 09/20/2023 09:09 AM   Modules accepted: Orders

## 2023-09-20 NOTE — Telephone Encounter (Signed)
 Hi, I finally got in touch with this patient and he said he is ok with the price of brand pradaxa  for 30 days. Can you send in the prescription for brand pradaxa  to his preferred pharmacy of [facility] in [place]? Thank you!

## 2023-09-24 ENCOUNTER — Ambulatory Visit: Payer: Self-pay | Admitting: Neurology

## 2023-09-24 NOTE — Progress Notes (Signed)
 Patient needs CPAP at 16- 17 cm water.

## 2023-09-24 NOTE — Procedures (Signed)
 [facility] POLYSOMNOGRAPHY  INTERPRETATION REPORT   STUDY DATE:  09/05/2023     PATIENT NAME:  Jacob Collier         DATE OF BIRTH:  1955/04/02  PATIENT ID:  [id number]    TYPE OF STUDY:  PSG  READING PHYSICIAN: DEDRA GORES, MD REFERRED BY: Hematology, and Dr Sherre, his PCP.  SCORING TECHNICIAN: Donnice Counts, RPSGT   HISTORY:  This 68 year-old Male patient was seen  on 07-20-2023  and has a chief complaint of  polycythemia, RA, Dyspnoea, Fatigue and  has had open heart surgery, CABG times 4,  prostate cancer treatment with radiation.  He is hearing impaired. He has had  atrial fibrillation.  Ablation 10-20-2022  . developed fever, PE  after that procedure.  He has undergone sleep testing before and didn't like CPAP.   ADDITIONAL INFORMATION:  The Epworth Sleepiness Scale : endorsed at   8 /24 points (scores above or equal to 10 are suggestive of hypersomnolence). FSS endorsed at   /63 points.  Height: 71 in Weight: 231 lbs (BMI 32) Neck Size: 19 in  MEDICATIONS: Norvasc , Aspirin , Tussinex, Klonopin , Vibra  Tabs, Eszopiclone , Repatha  sure click, Linzess , Toprol  XL, Omega 3, Afrin Allergy   TECHNICAL DESCRIPTION: A registered sleep technologist ( RPSGT)  was in attendance for the duration of the recording.  Data collection, scoring, video monitoring, and reporting were performed in compliance with the AASM Manual for the Scoring of Sleep and Associated Events; (Hypopnea is scored based on the criteria listed in Section VIII D. 1b in the AASM Manual V2.6 using a 4% oxygen desaturation rule or Hypopnea is scored based on the criteria listed in Section VIII D. 1a in the AASM Manual V2.6 using 3% oxygen desaturation and /or arousal rule).   SLEEP CONTINUITY AND SLEEP ARCHITECTURE:  Lights-out was at 21:40: and lights-on at  04:58:, with  7.3 hours of recording time . Total sleep time ( TST) was 310.5 minutes with a decreased sleep efficiency at 70.8%.   Sleep latency was normal at 5.0 minutes.  REM sleep latency was increased at 134.5 minutes. Of the total sleep time, the percentage of stage N1 sleep was 26.1%, stage N2 sleep was 67%, stage N3 sleep was 0.0%, and REM sleep was 6.6%.  There were 2 Stage R periods observed on this study night, 51 awakenings (i.e. transitions to Stage W from any sleep stage), and 179 total stage transitions. Wake after sleep onset (WASO) time accounted for 123 minutes, sleep remained very fragmented. SABRA   BODY POSITION:  TST was divided between the following sleep positions as follows: supine 56 minutes (18%), non-supine 255 minutes (82%); right 177 minutes (57%), left 77 minutes (25%), and prone 00 minutes (0%).  Total supine REM sleep time was 00 minutes (0% of total REM sleep).    RESPIRATORY MONITORING:   Based on CMS criteria (using a 4% oxygen desaturation rule for scoring hypopneas), there were 24 apneas (16 obstructive; 2 central; 6 mixed), and 56 hypopneas.  Apnea index was 4.6. Hypopnea index was 10.8.  The AHI ( apnea-hypopnea index)  was 15.5/h overall (26.8 supine,; 23.4 REM, with 0.0 supine REM).  There were 0 respiratory effort-related arousals (RERAs).    OXIMETRY: Oxyhemoglobin Saturation Nadir during sleep was at  80% -from a mean of 92%.  Total sleep time (TST)   hypoxemia (=<88%) was  4.6 minutes, or 1.5% of total sleep time.  LIMB MOVEMENTS: There  were 548 periodic limb movements of sleep (105.9/hr), of which many 28 (5.4/hr) were associated with an arousal. AROUSAL: There  were many arousals:  63 were identified as respiratory-related arousals (12 /h), 28 were PLM-related arousals (5 /h), and 115 were non-specific arousals (22 /h). There were 0 occurrences of Cheyne Stokes breathing.   EEG:  PSG EEG was of normal amplitude and frequency, with symmetric manifestation of sleep stages. EKG: The electrocardiogram documented NSR with borderline bradycardia.  The average heart rate during sleep was  61 bpm.  The heart rate during sleep varied between a minimum of 52 bpm and  a maximum of  82 bpm. AUDIO and VIDEO: Snoring was classified as mild.  IMPRESSION: 1) Sleep disordered breathing was present. There was OSA with an AHI of 15./h total - at the time s the SPLIT study should have been implemented the AHI was only 19.5/h and below the qualifying degree. This OSA was associated with minor hypoxia ( total time in hypoxia of 1.5 minutes, nadir at 80%)     2) Periodic limb movements were prominently seen, contributing to the high arousal count. 3)  Sleep fragmentation was noted- Most sleep arousals were related to spontaneous, unspecific arousals.  Total sleep time was reduced to 310.5 minutes.  Sleep efficiency was decreased at 70.8%.   REM sleep was reduced but was associated with a high AHI while sleeping on the left side.    RECOMMENDATIONS: OSA is present and just at the junction of mild to moderate degree. I would urge the patient to try a CPAP once again. The technologist did fit the patient for a FFM AirFit F 40 in medium in preparation for a SPLIT protocol. If the patient objects to a FFM, I would be happy to provide a nasal pillow or cradle for him.  Hypoxemia was mild - it is unclear if this degree of oxygen desaturation could be the cause of secondary polycythemia. It can be a contributing cause, alongside other possible risk factors of PE, CAD and radiation therapy. however mild, hypoxia will be correctible with CPAP.  PLMs were not persistent in REM sleep ( no evidence of REM sleep behaviour) .  His was activity in the right leg only and suspicious for a spinal or peripheral nerve problem, or the site of a DVT. The least PLMs were noted while the patient slept on his left.   I recommend to sleep on the  left side and use CPAP.    DEDRA GORES, MD                      General Information  Name: Kyzer, Blowe BMI: 67.77 Physician: DEDRA GORES, MD  ID:  [id number] Height: 71.0 in Technician: Donnice Counts, RPSGT  Sex: Male Weight: 231.0 lb Record: [id number]  Age: 68 [1956-03-03] Date: 09/05/2023    Medical & Medication History    VIRL COBLE is a 68 y.o. male patient who is seen upon referral on 07/20/2023 from Dr Sherre and Oncologist PA Cora for a Sleep evaluation.  Chief concern according to patient:  I couldn't use CPAP- but I don't trust the Sleep test either'. I have the pleasure of seeing SUZANNE GARBERS 07/20/23 a right-handed and severely hearing impaired pleasant Caucasian male with a possible sleep disorder. Had prostate cancer since 2015 and radiation treatment. CABG 01-2021. Has secondary polycythemia, high RBC since 07-05-2021. First time documented elevated H and 08-26-2021. Had a HST 08-2022 to look at causes  for polycythemia and was diagnosed with an AHI of 31.9/h.   Had anterior fusion C 6-7 in June 2024. The patient has a history of arrhythmia, atrial fibrillation, RVR since my 50's, had no cardioversion, and then ablation 10-20-2022. No pacemaker needed. He developed Angina ,SOB, and a fever after the procedure . He had developed a Pulmonary embolism after the procedure catheter was removed and was on anticoagulation for 6 month.  Norvasc , Aspirin , Tussinex, Klonopin , Vibra  Tabs, Eszopiclone , Repatha  sure click, Linzess , Toprol  XL, Omega 3, Afrin Allergy   Sleep Disorder      Comments   Patient arrived for a diagnostic polysomnogram. Procedure explained and all questions answered. Pt was shown CPAP at 5cm with a medium AirFit F40 full face mask prior to light out in possible preparation for a SPLIT night study. Patient tolerated CPAP at 5cm with a full face mask. Standard paste setup without complications. Patient slept supine, left, and right. Mild intermittent snoring heard. Respiratory events observed. Decreased sleep efficiency with considerable WASO, and persistent sleep was delayed. After 2 hours total sleep time, AHI = 19.5.  No significant cardiac arrhythmias observed. Severe PLMS observed.    Lights out: 09:40:24 PM Lights on: 04:58:35 AM   Time Total Supine Side Prone Upright  Recording (TRT) 7h 18.84m 1h 24.80m 5h 54.20m 0h 0.32m 0h 0.49m  Sleep (TST) 5h 10.73m 0h 56.41m 4h 14.36m 0h 0.59m 0h 0.41m   Latency N1 N2 N3 REM Onset Per. Slp. Eff.  Actual 0h 5.49m 0h 17.35m 0h 0.91m 2h 14.63m 0h 5.71m 1h 54.79m 70.81%   Stg Dur Wake N1 N2 N3 REM  Total 122.5 81.0 209.0 0.0 20.5  Supine 23.5 17.5 38.5 0.0 0.0  Side 99.0 63.5 170.5 0.0 20.5  Prone 0.0 0.0 0.0 0.0 0.0  Upright 0.0 0.0 0.0 0.0 0.0   Stg % Wake N1 N2 N3 REM  Total 28.3 26.1 67.3 0.0 6.6  Supine 5.4 5.6 12.4 0.0 0.0  Side 22.9 20.5 54.9 0.0 6.6  Prone 0.0 0.0 0.0 0.0 0.0  Upright 0.0 0.0 0.0 0.0 0.0     Apnea Summary Sub Supine Side Prone Upright  Total 24 Total 24 13 11  0 0    REM 2 0 2 0 0    NREM 22 13 9  0 0  Obs 16 REM 2 0 2 0 0    NREM 14 8 6  0 0  Mix 6 REM 0 0 0 0 0    NREM 6 4 2  0 0  Cen 2 REM 0 0 0 0 0    NREM 2 1 1  0 0   Rera Summary Sub Supine Side Prone Upright  Total 0 Total 0 0 0 0 0    REM 0 0 0 0 0    NREM 0 0 0 0 0   Hypopnea Summary Sub Supine Side Prone Upright  Total 102 Total 102 12 90 0 0    REM 9 0 9 0 0    NREM 93 12 81 0 0   4% Hypopnea Summary Sub Supine Side Prone Upright  Total (4%) 56 Total 56 12 44 0 0    REM 6 0 6 0 0    NREM 50 12 38 0 0     AHI Total Obs Mix Cen  24.35 Apnea 4.64 3.09 1.16 0.39   Hypopnea 19.71 -- -- --  15.46 Hypopnea (4%) 10.82 -- -- --    Total Supine Side Prone Upright  Position  AHI 24.35 26.79 23.81 0.00 0.00  REM AHI 32.20   NREM AHI 23.79   Position RDI 24.35 26.79 23.81 0.00 0.00  REM RDI 32.20   NREM RDI 23.79    4% Hypopnea Total Supine Side Prone Upright  Position AHI (4%) 15.46 26.79 12.97 0.00 0.00  REM AHI (4%) 23.41   NREM AHI (4%) 14.90   Position RDI (4%) 15.46 26.79 12.97 0.00 0.00  REM RDI (4%) 23.41   NREM RDI (4%) 14.90    Desaturation  Information Threshold: 2% <100% <90% <80% <70% <60% <50% <40%  Supine 65.0 8.0 0.0 0.0 0.0 0.0 0.0  Side 248.0 54.0 0.0 0.0 0.0 0.0 0.0  Prone 0.0 0.0 0.0 0.0 0.0 0.0 0.0  Upright 0.0 0.0 0.0 0.0 0.0 0.0 0.0  Total 313.0 62.0 0.0 0.0 0.0 0.0 0.0  Index 43.4 8.6 0.0 0.0 0.0 0.0 0.0   Threshold: 3% <100% <90% <80% <70% <60% <50% <40%  Supine 50.0 8.0 0.0 0.0 0.0 0.0 0.0  Side 159.0 51.0 0.0 0.0 0.0 0.0 0.0  Prone 0.0 0.0 0.0 0.0 0.0 0.0 0.0  Upright 0.0 0.0 0.0 0.0 0.0 0.0 0.0  Total 209.0 59.0 0.0 0.0 0.0 0.0 0.0  Index 29.0 8.2 0.0 0.0 0.0 0.0 0.0   Threshold: 4% <100% <90% <80% <70% <60% <50% <40%  Supine 43.0 8.0 0.0 0.0 0.0 0.0 0.0  Side 99.0 43.0 0.0 0.0 0.0 0.0 0.0  Prone 0.0 0.0 0.0 0.0 0.0 0.0 0.0  Upright 0.0 0.0 0.0 0.0 0.0 0.0 0.0  Total 142.0 51.0 0.0 0.0 0.0 0.0 0.0  Index 19.7 7.1 0.0 0.0 0.0 0.0 0.0   Threshold: 4% <100% <90% <80% <70% <60% <50% <40%  Supine 43 8 0 0 0 0 0  Side 99 43 0 0 0 0 0  Prone 0 0 0 0 0 0 0  Upright 0 0 0 0 0 0 0  Total 142 51 0 0 0 0 0   Awakening/Arousal Information # of Awakenings 51  Wake after sleep onset 123.53m  Wake after persistent sleep 55.38m   Arousal Assoc. Arousals Index  Apneas 11 2.1  Hypopneas 52 10.0  Leg Movements 28 5.4  Snore 0 0.0  PTT Arousals 0 0.0  Spontaneous 115 22.2  Total 205 39.6  Leg Movement Information PLMS LMs Index  Total LMs during PLMS 548 105.9  LMs w/ Microarousals 28 5.4   LM LMs Index  w/ Microarousal 0 0.0  w/ Awakening 4 0.8  w/ Resp Event 0 0.0  Spontaneous 14 2.7  Total 14 2.7     Desaturation threshold setting: 4% Minimum desaturation setting: 10 seconds SaO2 nadir: 68% The longest event was a 29 sec obstructive Hypopnea with a minimum SaO2 of 90%. The lowest SaO2 was 86% associated with a 10 sec obstructive Apnea. EKG Rates EKG Avg Max Min  Awake 64 93 53  Asleep 61 82 52  EKG Events: N/A, NSR

## 2023-09-26 ENCOUNTER — Telehealth: Payer: Self-pay | Admitting: Physician Assistant

## 2023-09-26 NOTE — Telephone Encounter (Signed)
 Spoke with patient to schedule his AWV.     He asked that someone contact him in reference to recent prescription called in to help him sleep.  When he went to pick it up his part after insurance was going to be $550.   He did not get the medicine and would like to know if something else more affordable could be called in for him.   Thank you,  Corean,  AMB Clinical Support [facility] AWV Program Direct Dial ??[phone]

## 2023-09-28 ENCOUNTER — Inpatient Hospital Stay: Attending: Hematology and Oncology

## 2023-09-28 ENCOUNTER — Inpatient Hospital Stay: Admitting: Hematology and Oncology

## 2023-09-28 VITALS — BP 148/89 | HR 66 | Temp 97.7°F | Resp 13 | Wt 213.4 lb

## 2023-09-28 DIAGNOSIS — C61 Malignant neoplasm of prostate: Secondary | ICD-10-CM | POA: Insufficient documentation

## 2023-09-28 DIAGNOSIS — G4733 Obstructive sleep apnea (adult) (pediatric): Secondary | ICD-10-CM | POA: Insufficient documentation

## 2023-09-28 DIAGNOSIS — D751 Secondary polycythemia: Secondary | ICD-10-CM

## 2023-09-28 DIAGNOSIS — Z79890 Hormone replacement therapy (postmenopausal): Secondary | ICD-10-CM | POA: Diagnosis not present

## 2023-09-28 DIAGNOSIS — Z9079 Acquired absence of other genital organ(s): Secondary | ICD-10-CM | POA: Insufficient documentation

## 2023-09-28 LAB — CBC WITH DIFFERENTIAL (CANCER CENTER ONLY)
Abs Immature Granulocytes: 0.03 K/uL (ref 0.00–0.07)
Basophils Absolute: 0.1 K/uL (ref 0.0–0.1)
Basophils Relative: 1 %
Eosinophils Absolute: 0.2 K/uL (ref 0.0–0.5)
Eosinophils Relative: 2 %
HCT: 54.3 % — ABNORMAL HIGH (ref 39.0–52.0)
Hemoglobin: 18 g/dL — ABNORMAL HIGH (ref 13.0–17.0)
Immature Granulocytes: 0 %
Lymphocytes Relative: 23 %
Lymphs Abs: 1.8 K/uL (ref 0.7–4.0)
MCH: 26.7 pg (ref 26.0–34.0)
MCHC: 33.1 g/dL (ref 30.0–36.0)
MCV: 80.4 fL (ref 80.0–100.0)
Monocytes Absolute: 0.5 K/uL (ref 0.1–1.0)
Monocytes Relative: 7 %
Neutro Abs: 5.2 K/uL (ref 1.7–7.7)
Neutrophils Relative %: 67 %
Platelet Count: 219 K/uL (ref 150–400)
RBC: 6.75 MIL/uL — ABNORMAL HIGH (ref 4.22–5.81)
RDW: 17.9 % — ABNORMAL HIGH (ref 11.5–15.5)
WBC Count: 7.7 K/uL (ref 4.0–10.5)
nRBC: 0 % (ref 0.0–0.2)

## 2023-09-28 LAB — FERRITIN: Ferritin: 136 ng/mL (ref 24–336)

## 2023-10-03 ENCOUNTER — Telehealth: Payer: Self-pay

## 2023-10-03 ENCOUNTER — Other Ambulatory Visit: Payer: Self-pay | Admitting: Student

## 2023-10-03 MED ORDER — AMLODIPINE BESYLATE 5 MG PO TABS: ORAL_TABLET | ORAL | Status: DC

## 2023-10-03 NOTE — Telephone Encounter (Signed)
 Luke are you still helping patient with patient assistants?  Copied from CRM [id number]. Topic: Clinical - Prescription Issue >> Oct 03, 2023 12:00 PM Winona R wrote: Pt is almost due for a refill on Eszopiclone  3 MG TABS however is waiting on DR Craft to assistant him with finding a substitution ro this medication and the previous medication that was prescribed as it cost $550. Pt has not received a call back

## 2023-10-04 ENCOUNTER — Telehealth: Payer: Self-pay

## 2023-10-04 ENCOUNTER — Other Ambulatory Visit: Payer: Self-pay

## 2023-10-04 ENCOUNTER — Encounter: Payer: Self-pay | Admitting: Physician Assistant

## 2023-10-04 DIAGNOSIS — F5101 Primary insomnia: Secondary | ICD-10-CM

## 2023-10-04 NOTE — Telephone Encounter (Signed)
 Referral has been made.

## 2023-10-04 NOTE — Progress Notes (Signed)
 Complex Care Management Note Care Guide Note  10/04/2023 Name: Jacob Collier MRN: [id number] DOB: 24-Oct-1955   Complex Care Management Outreach Attempts: An unsuccessful telephone outreach was attempted today to offer the patient information about available complex care management services.  Follow Up Plan:  Additional outreach attempts will be made to offer the patient complex care management information and services.   Encounter Outcome:  No Answer  Dreama [facility]  [facility], [facility] Care Management Assistant Direct Dial: [phone]  Fax: [phone]

## 2023-10-09 ENCOUNTER — Telehealth: Payer: Self-pay

## 2023-10-09 ENCOUNTER — Other Ambulatory Visit: Payer: Self-pay | Admitting: Physician Assistant

## 2023-10-09 DIAGNOSIS — F5101 Primary insomnia: Secondary | ICD-10-CM

## 2023-10-09 MED ORDER — RAMELTEON 8 MG PO TABS: 8.0000 mg | ORAL_TABLET | Freq: Every day | ORAL | 0 refills | Status: DC

## 2023-10-09 NOTE — Telephone Encounter (Signed)
 Copied from CRM [id number]. Topic: Clinical - Medication Question >> Oct 09, 2023  4:25 PM Delon DASEN wrote: Reason for CRM: Patient would like the Ramelteon   that Nola had suggested- please call in to [facility] at [facility] if possible, would like to get it  called in today.

## 2023-10-10 ENCOUNTER — Encounter: Payer: Self-pay | Admitting: Hematology and Oncology

## 2023-10-16 ENCOUNTER — Ambulatory Visit: Payer: Self-pay

## 2023-10-16 NOTE — Telephone Encounter (Signed)
 FYI Only or Action Required?: Action required by provider: clinical question for provider and update on patient condition.  Patient was last seen in primary care on 08/07/2023 by Milon Cleaves, PA.  Called Nurse Triage reporting Back Pain.  Symptoms began several days ago.  Interventions attempted: Rest, hydration, or home remedies.  Symptoms are: unchanged.  Triage Disposition: See HCP Within 4 Hours (Or PCP Triage)  Patient/caregiver understands and will follow disposition?: Yes, but will wait  Copied from CRM [id number]. Topic: Clinical - Red Word Triage >> Oct 16, 2023  4:36 PM Kevelyn M wrote: Red Word that prompted transfer to Nurse Triage: All of his joints and lower back is hurting. The last two days. He's been in bed for two days. Reason for Disposition  [1] MODERATE back pain (e.g., interferes with normal activities) AND [2] present > 3 days  Side (flank) or lower back pain present  Answer Assessment - Initial Assessment Questions 1. ONSET: When did the pain begin? (e.g., minutes, hours, days)     2 days ago 2. LOCATION: Where does it hurt? (upper, mid or lower back)     Lower back  3. SEVERITY: How bad is the pain?  (e.g., Scale 1-10; mild, moderate, or severe)     7/10 4. PATTERN: Is the pain constant? (e.g., yes, no; constant, intermittent)      constant 5. RADIATION: Does the pain shoot into your legs or somewhere else?      6. CAUSE:  What do you think is causing the back pain?      Unsure 7. BACK OVERUSE:  Any recent lifting of heavy objects, strenuous work or exercise?      8. MEDICINES: What have you taken so far for the pain? (e.g., nothing, acetaminophen , NSAIDS)     Tylenol   9. NEUROLOGIC SYMPTOMS: Do you have any weakness, numbness, or problems with bowel/bladder control?     Denies. Intermittent urinary incontinence at baseline.  10. OTHER SYMPTOMS: Do you have any other symptoms? (e.g., fever, abdomen pain, burning with urination, blood  in urine)       Urine is darker in color than normal.  Joint pain present. Temp is 98.9  Additional info: Refused in person visit, request virtual with pcp. Scheduled.  Protocols used: Back Pain-A-AH, Urinary Symptoms-A-AH

## 2023-10-17 ENCOUNTER — Ambulatory Visit: Admitting: Physician Assistant

## 2023-10-20 ENCOUNTER — Other Ambulatory Visit: Payer: Self-pay

## 2023-10-20 ENCOUNTER — Ambulatory Visit: Attending: Cardiovascular Disease | Admitting: Cardiovascular Disease

## 2023-10-20 ENCOUNTER — Encounter: Payer: Self-pay | Admitting: Cardiovascular Disease

## 2023-10-20 VITALS — BP 122/80 | HR 58 | Ht 71.0 in | Wt 215.0 lb

## 2023-10-20 DIAGNOSIS — R002 Palpitations: Secondary | ICD-10-CM | POA: Diagnosis not present

## 2023-10-20 DIAGNOSIS — I471 Supraventricular tachycardia, unspecified: Secondary | ICD-10-CM

## 2023-10-20 DIAGNOSIS — I483 Typical atrial flutter: Secondary | ICD-10-CM | POA: Diagnosis not present

## 2023-10-20 NOTE — Patient Instructions (Addendum)
 Medication Instructions:  Your physician recommends that you continue on your current medications as directed. Please refer to the Current Medication list given to you today.  *If you need a refill on your cardiac medications before your next appointment, please call your pharmacy*  Testing/Procedures: Ablation Your physician has recommended that you have an ablation. Catheter ablation is a medical procedure used to treat some cardiac arrhythmias (irregular heartbeats). During catheter ablation, a long, thin, flexible tube is put into a blood vessel in your groin (upper thigh), or neck. This tube is called an ablation catheter. It is then guided to your heart through the blood vessel. Radio frequency waves destroy small areas of heart tissue where abnormal heartbeats may cause an arrhythmia to start.   You are scheduled for Atrial Flutter Ablationon Tuesday, October 7th with Dr. Eulas Furbish.Please arrive at the Main Entrance A at [facility]: [address] [place], [place] [address] at 8:00am    What To Expect:  Labs: you will need to have lab work drawn the week of September 15th. Please go to any [facility] location to have these drawn - no appointment is needed. You will receive procedure instructions either through MyChart or in the mail 4-6 week prior to your procedure.  After your procedure we recommend no driving for 3 days, no lifting over 10 lbs for 5 days, and no work or strenuous activity for 7 days.  Please contact our office at [phone] if you have any questions.    Follow-Up: We will contact you to schedule your post-procedure appointments.

## 2023-10-20 NOTE — Progress Notes (Signed)
 Electrophysiology Office Note:    Date:  10/20/2023   ID:  Jacob Collier, DOB 1955-04-28, MRN [id number]  PCP:  Milon Cleaves, PA   [facility] Providers Cardiologist:  Peter [place], MD Cardiology APP:  Jadine Aline BRAVO, PA-C  Electrophysiologist:  Eulas BRAVO Furbish, MD     Referring MD: Milon Cleaves, [name]   History of Present Illness:    Jacob Collier is a 68 y.o. male with a hx listed below, significant for paroxysmal SVT, referred for arrhythmia management.  He recently wore a monitor that detected occasional episodes of tachycardia. The longest lasted almost 15 minutes.  Atrial activity is difficult to discern due to artifact.  He has had rapid and irregular rhythms for years, predating his CABG.  Seems like they have gotten worse recently.  He is particular bothered by rapid rates which last for a few minutes.  He uses a pulse ox and it frequently shows rates of about 145 bpm.  These occur with rapid onset but oftentimes slow down gradually.  He also notices occasional irregular rhythms.  He has not had syncope.  He has had edema with amlodipine .  He does not tolerate higher doses of beta-blocker very well.  He underwent successful ablation of AVNRT, but it was complicated by DVT after the procedure and he was placed on Eliquis .  He was referred to the ER from his primary care doctor on June 25 with atrial flutter.  He had minimal symptoms with a flutter and underwent cardioversion later that month.  He has not been tolerating his Pradaxa  very well and has had GI upset and back pain.  The symptoms resolved when he held his Pradaxa  temporarily    EKGs/Labs/Other Studies Reviewed Today:    Echocardiogram:  TEE 02/01/21 EF 50% with mildly hypokinetic inferior wall    Monitors:  ZioXT 11 days 04/2022 Sinus rhythm HR 47-99 bpm, avg 69. 2% PACs  Multiple episodes of narrow complex tachycardia One strip (2/10 10:11:03) appears to show a short R-P  EKG:   EKG  Interpretation Date/Time:  Friday October 20 2023 10:57:17 EDT Ventricular Rate:  58 PR Interval:  184 QRS Duration:  106 QT Interval:  474 QTC Calculation: 465 R Axis:   82  Text Interpretation: Sinus bradycardia Cannot rule out Inferior infarct , age undetermined When compared with ECG of 13-Sep-2023 08:52, No significant change was found Confirmed by Furbish Eulas [phone]) on 10/20/2023 1:49:47 PM     Recent Labs: 10/28/2022: B Natriuretic Peptide 40.3 11/02/2022: Magnesium  2.2 05/11/2023: ALT 33 08/07/2023: BUN 15; Creatinine, Ser 1.22; Potassium 4.3; Sodium 139 09/28/2023: Hemoglobin 18.0; Platelet Count 219     Physical Exam:    VS:  BP 122/80 (BP Location: Left Arm, Patient Position: Sitting, Cuff Size: Large)   Pulse (!) 58   Ht 5' 11 (1.803 m)   Wt 215 lb (97.5 kg)   SpO2 97%   BMI 29.99 kg/m     Wt Readings from Last 3 Encounters:  10/20/23 215 lb (97.5 kg)  09/28/23 213 lb 6.4 oz (96.8 kg)  09/13/23 221 lb 9.6 oz (100.5 kg)     GEN: Well nourished, well developed in no acute distress CARDIAC: RRR, no murmurs, rubs, gallops RESPIRATORY:  Normal work of breathing MUSCULOSKELETAL: no edema    ASSESSMENT & PLAN:    Atrial flutter Documented on EKG August 22, 2023 We discussed management options, and using a shared decision making approach decided to proceed with mapping and ablation  of the flutter He has not well tolerating anticoagulation, so we will hope to discontinue this in the future   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  Currently on Xarelto  and aspirin  (Plavix  held) due to PE Continue metoprolol             Medication  Adjustments/Labs and Tests Ordered: Current medicines are reviewed at length with the patient today.  Concerns regarding medicines are outlined above.  Orders Placed This Encounter  Procedures   EKG 12-Lead   No orders of the defined types were placed in this encounter.    Signed, Eulas FORBES Furbish, MD  10/20/2023 11:25 AM    [facility]

## 2023-10-24 ENCOUNTER — Ambulatory Visit (INDEPENDENT_AMBULATORY_CARE_PROVIDER_SITE_OTHER): Admitting: Physician Assistant

## 2023-10-24 ENCOUNTER — Encounter: Payer: Self-pay | Admitting: Physician Assistant

## 2023-10-24 VITALS — BP 110/72 | HR 74 | Temp 97.5°F | Ht 71.0 in | Wt 212.0 lb

## 2023-10-24 DIAGNOSIS — I2510 Atherosclerotic heart disease of native coronary artery without angina pectoris: Secondary | ICD-10-CM | POA: Diagnosis not present

## 2023-10-24 DIAGNOSIS — J418 Mixed simple and mucopurulent chronic bronchitis: Secondary | ICD-10-CM | POA: Diagnosis not present

## 2023-10-24 DIAGNOSIS — N189 Chronic kidney disease, unspecified: Secondary | ICD-10-CM

## 2023-10-24 DIAGNOSIS — E782 Mixed hyperlipidemia: Secondary | ICD-10-CM | POA: Diagnosis not present

## 2023-10-24 DIAGNOSIS — Z955 Presence of coronary angioplasty implant and graft: Secondary | ICD-10-CM | POA: Diagnosis not present

## 2023-10-24 DIAGNOSIS — I10 Essential (primary) hypertension: Secondary | ICD-10-CM | POA: Diagnosis not present

## 2023-10-24 DIAGNOSIS — G4733 Obstructive sleep apnea (adult) (pediatric): Secondary | ICD-10-CM

## 2023-10-24 DIAGNOSIS — Z8546 Personal history of malignant neoplasm of prostate: Secondary | ICD-10-CM

## 2023-10-24 DIAGNOSIS — I480 Paroxysmal atrial fibrillation: Secondary | ICD-10-CM | POA: Diagnosis not present

## 2023-10-24 DIAGNOSIS — F5101 Primary insomnia: Secondary | ICD-10-CM | POA: Diagnosis not present

## 2023-10-24 DIAGNOSIS — R198 Other specified symptoms and signs involving the digestive system and abdomen: Secondary | ICD-10-CM | POA: Diagnosis not present

## 2023-10-24 MED ORDER — ZOLPIDEM TARTRATE ER 6.25 MG PO TBCR
6.2500 mg | EXTENDED_RELEASE_TABLET | Freq: Every evening | ORAL | 0 refills | Status: DC | PRN
Start: 2023-10-24 — End: 2023-11-20

## 2023-10-24 NOTE — Assessment & Plan Note (Signed)
 Chronic insomnia, previously managed with Ambien . Prefers Ambien  despite addiction concerns. - Prescribe Ambien  extended release for insomnia management.

## 2023-10-24 NOTE — Assessment & Plan Note (Signed)
 Hyperlipidemia managed with Repatha . Cholesterol well-controlled. Interested in detailed lipid panel. - Order detailed lipid panel to assess LDL particle size and cardiovascular risk.

## 2023-10-24 NOTE — Assessment & Plan Note (Signed)
 Prostate cancer treated with radiation. Undetectable PSA levels. Improved energy and health.

## 2023-10-24 NOTE — Assessment & Plan Note (Addendum)
 Atrial flutter treated with cardioversion. Ablation scheduled. Pradaxa  causing side effects. Hopeful for transition to Plavix  post-ablation. - Continue Pradaxa  until ablation. - Discuss potential transition back to Plavix  post-ablation with cardiologist.  Pradaxa  may exacerbate renal function. Reports lower back pain possibly related to renal issues. - Order blood work to assess kidney function.

## 2023-10-24 NOTE — Progress Notes (Unsigned)
 Acute Office Visit  Subjective:    Patient ID: Jacob Collier, male    DOB: 10-Nov-1955, 68 y.o.   MRN: [id number]  Chief Complaint  Patient presents with   Discuss insomnia    HPI: Discussed the use of AI scribe software for clinical note transcription with the patient, who gave verbal consent to proceed.  History of Present Illness  Jacob Collier Cindy Brindisi is a 68 year old male with atrial flutter who presents with sleep issues and concerns about Pradaxa  side effects.  He has ongoing sleep disturbances, often staying awake until 2 or 3 AM, and struggles to maintain a regular sleep pattern. He has experienced insomnia since age 46 and previously found Ambien  effective.  He is currently on Pradaxa , which is causing significant gastrointestinal discomfort and lower back pain. He has a history of gastrointestinal issues and is concerned about the impact of Pradaxa  on his kidney function. He previously used Eliquis  and Savaysa  and has been on Plavix  for a long time without issues. He has a history of pulmonary embolism treated with anticoagulants.  He is scheduled for an atrial flutter ablation on October 7th, following a cardioversion that restored his heart rhythm. He describes the flutter as 'the scariest shit' he has experienced, causing severe fatigue and breathlessness. He was on blood thinners for four weeks prior to cardioversion and feared going to sleep due to the flutter.  He has been on a strict bodybuilding diet, losing weight from 231 pounds to around 211-212 pounds, with a goal to lose 40 pounds overall. He reports improved blood pressure readings, with diastolic in the 70s and systolic perfect. He is focused on maintaining low sodium intake and consuming lean proteins and complex carbohydrates.  He has a history of prostate cancer treated with radiation, intestinal surgery, and hip replacement. He mentions past heart surgery and bypass without a heart attack, and his heart muscle  is reportedly in excellent condition. He is concerned about scar tissue and adhesions from intestinal surgery affecting his gastrointestinal health.  He is actively engaged in weight training and reports muscle memory aiding his fitness journey. He is excited about his weight loss and its positive impact on his health, including improved breathing and reduced COPD symptoms. 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           Past Medical History:  Diagnosis Date   Anxiety    Arthritis    Coronary artery disease    a. S/p DES to RCA in 01/2019 b. s/p CABG x4 (LIMA-LAD, reverse SVG-PDA, reverse SVG-OM1, reverse SVG-1st Diag) in 01/2021   Diverticulosis    Dyspnea    Hard of hearing    Hyperlipidemia    Hypertension    hx of elevation on lyrica , off med and now normal    Paroxysmal SVT (supraventricular tachycardia) (HCC)    Post-op atrial fibrillation    Prostate cancer (HCC)    Wears glasses     Past Surgical History:  Procedure Laterality Date   ANTERIOR CERVICAL DECOMP/DISCECTOMY FUSION N/A 09/03/2021   Procedure: CERVICAL FIVE-SIX, CERVICAL SIX-SEVEN ANTERIOR CERVICAL DECOMPRESSION/DISCECTOMY FUSION;  Surgeon: Louis Shove, MD;  Location: [facility] OR;  Service: Neurosurgery;  Laterality: N/A;   CARDIAC CATHETERIZATION     with 2 stents    CARDIOVERSION N/A 08/30/2023   Procedure: CARDIOVERSION;  Surgeon: Raford Riggs, MD;  Location: [facility] INVASIVE CV LAB;  Service: Cardiovascular;  Laterality: N/A;   COLECTOMY  05/2018   COLONOSCOPY  CORONARY ARTERY BYPASS GRAFT N/A 01/19/2021   Procedure: CORONARY ARTERY BYPASS GRAFTING (CABG), ON PUMP TIMES FOUR, USING LEFT INTERNAL MAMMARY ARTERY AND ENDOSOCPICALLY HARVESTED RIGHT GREATER SAPHENOUS VEIN;  Surgeon: Shyrl Linnie KIDD, MD;  Location: [facility] OR;  Service: Open Heart Surgery;  Laterality: N/A;   CYSTOSCOPY WITH URETHRAL DILATATION N/A 01/14/2021   Procedure: CYSTOSCOPY WITH BALLOON  DILATATION;  Surgeon: Gaston Hamilton, MD;  Location: [facility] ORS;  Service: Urology;  Laterality: N/A;   EYE SURGERY  02/2020   bilaterl cataracts   IR THORACENTESIS ASP PLEURAL SPACE W/IMG GUIDE  02/02/2021   LEFT HEART CATH AND CORONARY ANGIOGRAPHY N/A 12/24/2020   Procedure: LEFT HEART CATH AND CORONARY ANGIOGRAPHY;  Surgeon: Swaz[place], Peter M, MD;  Location: [facility] INVASIVE CV LAB;  Service: Cardiovascular;  Laterality: N/A;   PROSTATECTOMY     SVT ABLATION N/A 10/20/2022   Procedure: SVT ABLATION;  Surgeon: Nancey Eulas BRAVO, MD;  Location: [facility] INVASIVE CV LAB;  Service: Cardiovascular;  Laterality: N/A;   TEE WITHOUT CARDIOVERSION N/A 01/19/2021   Procedure: TRANSESOPHAGEAL ECHOCARDIOGRAM (TEE);  Surgeon: Shyrl Linnie KIDD, MD;  Location: [facility] OR;  Service: Open Heart Surgery;  Laterality: N/A;   TOTAL HIP ARTHROPLASTY Left 11/10/2015   Procedure: LEFT TOTAL HIP ARTHROPLASTY ANTERIOR APPROACH;  Surgeon: Lonni CINDERELLA Poli, MD;  Location: [facility] OR;  Service: Orthopedics;  Laterality: Left;    Family History  Problem Relation Age of Onset   Alzheimer's disease Mother    Stroke Father    Diabetes Sister    Colon polyps Brother    Heart attack Maternal Grandmother    Cancer Maternal Uncle        prostate   Prostate cancer Maternal Uncle    Cancer Maternal Uncle        prostate   Prostate cancer Maternal Uncle    Colon cancer Neg Hx    Esophageal cancer Neg Hx    Rectal cancer Neg Hx    Stomach cancer Neg Hx     Social History   Socioeconomic History   Marital status: Married    Spouse name: Not on file   Number of children: 0   Years of education: Not on file   Highest education level: Not on file  Occupational History   Occupation: Employed at [employer]  Tobacco Use   Smoking status: Never    Passive exposure: Never   Smokeless tobacco: Never  Vaping Use   Vaping status: Never Used  Substance and Sexual Activity   Alcohol use: Not Currently    Alcohol/week: 0.0 standard drinks of alcohol     Comment: rarely   Drug use: No   Sexual activity: Yes    Partners: Female    Birth control/protection: None  Other Topics Concern   Not on file  Social History Narrative   Not on file   Social Drivers of Health   Financial Resource Strain: Low Risk  (08/04/2022)   Overall Financial Resource Strain (CARDIA)    Difficulty of Paying Living Expenses: Not hard at all  Food Insecurity: No Food Insecurity (10/29/2022)   Hunger Vital Sign    Worried About Running Out of Food in the Last Year: Never true    Ran Out of Food in the Last Year: Never true  Transportation Needs: No Transportation Needs (10/29/2022)   PRAPARE - Administrator, Civil Service (Medical): No    Lack of Transportation (Non-Medical): No  Physical Activity: Sufficiently Active (08/04/2022)  Exercise Vital Sign    Days of Exercise per Week: 5 days    Minutes of Exercise per Session: 60 min  Stress: No Stress Concern Present (08/04/2022)   [employer] of Occupational Health - Occupational Stress Questionnaire    Feeling of Stress : Not at all  Social Connections: Moderately Isolated (08/04/2022)   Social Connection and Isolation Panel    Frequency of Communication with Friends and Family: More than three times a week    Frequency of Social Gatherings with Friends and Family: More than three times a week    Attends Religious Services: Never    Database administrator or Organizations: No    Attends Banker Meetings: Never    Marital Status: Married  Catering manager Violence: Not At Risk (10/29/2022)   Humiliation, Afraid, Rape, and Kick questionnaire    Fear of Current or Ex-Partner: No    Emotionally Abused: No    Physically Abused: No    Sexually Abused: No    Outpatient Medications Prior to Visit  Medication Sig Dispense Refill   amLODipine  (NORVASC ) 5 MG tablet Take 1 tablet (5mg ) in the morning and 1/2 tablet (2.5mg ) in the evening. (Patient taking differently: Take 5 mg by mouth  in the morning and at bedtime. Take 0.5 tablet (2.5 mg) in the morning and 1/2 tablet (2.5mg ) in the evening.)     aspirin  EC 81 MG tablet Take 81 mg by mouth daily. Swallow whole.     clonazePAM  (KLONOPIN ) 1 MG tablet TAKE 1 TABLET BY MOUTH TWICE A DAY AS NEEDED FOR ANXIETY 60 tablet 3   dabigatran  (PRADAXA ) 150 MG CAPS capsule Take 1 capsule (150 mg total) by mouth 2 (two) times daily. 60 capsule 11   Evolocumab  (REPATHA  SURECLICK) 140 MG/ML SOAJ INJECT 140 MG INTO THE SKIN EVERY 14 (FOURTEEN) DAYS. 6 mL 3   ezetimibe  (ZETIA ) 10 MG tablet TAKE 1 TABLET BY MOUTH EVERY DAY 90 tablet 1   metoprolol  succinate (TOPROL -XL) 25 MG 24 hr tablet Take 12.5 mg by mouth at bedtime. (Patient taking differently: Take 12.5 mg by mouth in the morning.)     oxymetazoline (AFRIN) 0.05 % nasal spray Place 1 spray into both nostrils 2 (two) times daily as needed for congestion.     ramelteon  (ROZEREM ) 8 MG tablet Take 1 tablet (8 mg total) by mouth at bedtime. (Patient not taking: Reported on 10/20/2023) 30 tablet 0   No facility-administered medications prior to visit.    Allergies  Allergen Reactions   Ranexa [Ranolazine] Other (See Comments)    Chest discomfort Acid reflux   Amoxil  Daine.Crawley ] Hives   Lipitor  [Atorvastatin ] Other (See Comments)    Myalgias   Pravachol  [Pravastatin ] Other (See Comments)    Myalgias    Rosuvastatin  Other (See Comments)    Myalgias    Statins Other (See Comments)    Myalgias    Eliquis  [Apixaban ] Rash and Other (See Comments)    Arthralgias Tachycardia    Xarelto  [Rivaroxaban ] Rash and Other (See Comments)    Arthralgias Tachycardia     Review of Systems  Constitutional:  Negative for appetite change, fatigue and fever.  HENT:  Negative for congestion, ear pain, sinus pressure and sore throat.   Respiratory:  Negative for cough, chest tightness, shortness of breath and wheezing.   Cardiovascular:  Negative for chest pain and palpitations.  Gastrointestinal:   Negative for abdominal pain, constipation, diarrhea, nausea and vomiting.  Genitourinary:  Negative for dysuria and  hematuria.  Musculoskeletal:  Negative for arthralgias, back pain, joint swelling and myalgias.  Skin:  Negative for rash.  Neurological:  Negative for dizziness, weakness and headaches.  Psychiatric/Behavioral:  Positive for sleep disturbance (insomnia). Negative for dysphoric mood. The patient is not nervous/anxious.        Objective:        10/24/2023    1:25 PM 10/20/2023   11:00 AM 09/28/2023   11:20 AM  Vitals with BMI  Height 5' 11 5' 11   Weight 212 lbs 215 lbs 213 lbs 6 oz  BMI 29.58 30 29.78  Systolic 110 122 851  Diastolic 72 80 89  Pulse 74 58 66    No data found.    Physical Exam Vitals reviewed.  Constitutional:      Appearance: Normal appearance.  Cardiovascular:     Rate and Rhythm: Normal rate and regular rhythm.     Heart sounds: Normal heart sounds.  Pulmonary:     Effort: Pulmonary effort is normal.     Breath sounds: Normal breath sounds.  Abdominal:     General: Bowel sounds are normal.     Palpations: Abdomen is soft.     Tenderness: There is no abdominal tenderness.  Neurological:     Mental Status: He is alert and oriented to person, place, and time.  Psychiatric:        Mood and Affect: Mood normal.        Behavior: Behavior normal.     Health Maintenance Due  Topic Date Due   Medicare Annual Wellness (AWV)  Never done   DTaP/Tdap/Td (1 - Tdap) Never done   Zoster Vaccines- Shingrix (1 of 2) Never done   INFLUENZA VACCINE  10/06/2023    There are no preventive care reminders to display for this patient.   Lab Results  Component Value Date   TSH 1.570 04/04/2022   Lab Results  Component Value Date   WBC 9.0 10/24/2023   HGB 17.4 10/24/2023   HCT 55.6 (H) 10/24/2023   MCV 86 10/24/2023   PLT 211 10/24/2023   Lab Results  Component Value Date   NA 138 10/24/2023   K 4.8 10/24/2023   CO2 20 10/24/2023    GLUCOSE 97 10/24/2023   BUN 16 10/24/2023   CREATININE 1.03 10/24/2023   BILITOT 0.6 10/24/2023   ALKPHOS 90 10/24/2023   AST 33 10/24/2023   ALT 51 (H) 10/24/2023   PROT 6.7 10/24/2023   ALBUMIN  4.2 10/24/2023   CALCIUM  9.4 10/24/2023   ANIONGAP 13 08/07/2023   EGFR 79 10/24/2023   Lab Results  Component Value Date   CHOL 85 (L) 05/11/2023   Lab Results  Component Value Date   HDL 39 (L) 05/11/2023   Lab Results  Component Value Date   LDLCALC 25 05/11/2023   Lab Results  Component Value Date   TRIG 114 05/11/2023   Lab Results  Component Value Date   CHOLHDL 2.2 05/11/2023   Lab Results  Component Value Date   HGBA1C 5.7 (H) 10/24/2023       Assessment & Plan:  Primary insomnia Assessment & Plan: Chronic insomnia, previously managed with Ambien . Prefers Ambien  despite addiction concerns. - Prescribe Ambien  extended release for insomnia management.  Orders: -     Zolpidem  Tartrate ER; Take 1 tablet (6.25 mg total) by mouth at bedtime as needed for sleep. Take 1-2 tablets by mouth at night for bedtime.  Dispense: 30 tablet; Refill: 0  Mixed  simple and mucopurulent chronic bronchitis (HCC) Assessment & Plan: Check labs  Orders: -     CBC with Differential/Platelet -     Comprehensive metabolic panel with GFR  H/O heart artery stent Assessment & Plan: Management per specialist  Orders: -     Hemoglobin A1c  Mixed hyperlipidemia Assessment & Plan: Hyperlipidemia managed with Repatha . Cholesterol well-controlled. Interested in detailed lipid panel. - Order detailed lipid panel to assess LDL particle size and cardiovascular risk.  Orders: -     NMR Lipoprof wSubCls+Graph  Paroxysmal atrial fibrillation (HCC) Assessment & Plan: Atrial flutter treated with cardioversion. Ablation scheduled. Pradaxa  causing side effects. Hopeful for transition to Plavix  post-ablation. - Continue Pradaxa  until ablation. - Discuss potential transition back to Plavix   post-ablation with cardiologist.  Pradaxa  may exacerbate renal function. Reports lower back pain possibly related to renal issues. - Order blood work to assess kidney function.   Obstructive sleep apnea of adult Assessment & Plan: Mild to moderate sleep apnea. Weight loss expected to improve symptoms. Resistant to CPAP. - Continue weight loss efforts to potentially reduce sleep apnea severity.   History of prostate cancer Assessment & Plan: Prostate cancer treated with radiation. Undetectable PSA levels. Improved energy and health.   Chronic kidney disease, unspecified CKD stage Assessment & Plan: Pradaxa  may exacerbate renal function. Reports lower back pain possibly related to renal issues. - Order blood work to assess kidney function.   Gastrointestinal complaints Assessment & Plan: Chronic symptoms post-surgery, possibly due to adhesions. Gastroenterologist evaluation scheduled. - Follow up with gastroenterologist in October for evaluation of possible adhesions.   CAD in native artery Assessment & Plan: Coronary artery disease treated with bypass. Good heart function. Lifestyle changes improving cardiovascular health.   Essential hypertension Assessment & Plan: Hypertension well-controlled. Weight loss contributing to improved blood pressure.    Meds ordered this encounter  Medications   zolpidem  (AMBIEN  CR) 6.25 MG CR tablet    Sig: Take 1 tablet (6.25 mg total) by mouth at bedtime as needed for sleep. Take 1-2 tablets by mouth at night for bedtime.    Dispense:  30 tablet    Refill:  0    Orders Placed This Encounter  Procedures   CBC with Differential/Platelet   Comprehensive metabolic panel with GFR   Hemoglobin A1c   NMR Lipoprof wSubCls+Graph     Follow-up: Return in about 3 months (around 01/15/2024) for Chronic, Nola.  An After Visit Summary was printed and given to the patient.   I,Marla I Leal-Borjas,acting as a scribe for [employer],  PA.,have documented all relevant documentation on the behalf of Nola Angles, PA,as directed by  Nola Angles, PA while in the presence of Nola Angles, [name].   I,Lauren M Auman,acting as a scribe for Nola Angles, PA.,have documented all relevant documentation on the behalf of Nola Angles, PA,as directed by  Nola Angles, PA while in the presence of Nola Angles, [name].    Nola Angles, [name] [facility] [phone]

## 2023-10-24 NOTE — Patient Instructions (Addendum)
 Gastrointestinal symptoms after intestinal surgery (possible adhesions) Chronic symptoms post-surgery, possibly due to adhesions. Gastroenterologist evaluation scheduled. - Follow up with gastroenterologist in October for evaluation of possible adhesions.   Coronary artery disease, status post bypass Coronary artery disease treated with bypass. Good heart function. Lifestyle changes improving cardiovascular health.

## 2023-10-25 NOTE — Progress Notes (Unsigned)
 Complex Care Management Note Care Guide Note  10/25/2023 Name: [name] MRN: [id number] DOB: November 05, 1955   Complex Care Management Outreach Attempts: A second unsuccessful outreach was attempted today to offer the patient with information about available complex care management services.  Follow Up Plan:  Additional outreach attempts will be made to offer the patient complex care management information and services.   Encounter Outcome:  No Answer  Dreama [facility]  [facility], [facility] VBCI Assistant Direct Dial: [phone]  Fax: [phone]

## 2023-10-26 LAB — NMR LIPOPROF WSUBCLS+GRAPH
Cholesterol, Total: 84 mg/dL — ABNORMAL LOW (ref 100–199)
HDL Particle Number: 27.6 umol/L — ABNORMAL LOW (ref >=30.5)
HDL Size: 8.7 nm — ABNORMAL LOW (ref >=9.2)
HDL-C: 31 mg/dL — ABNORMAL LOW (ref >39)
LDL Particle Number: 302 nmol/L (ref <1000)
LDL Size: 19.9 nm — ABNORMAL LOW (ref >20.5)
LDL Size: 19.9 nm — ABNORMAL LOW (ref >=20.8)
LDL-C (NIH Calc): 31 mg/dL (ref 0–99)
LP-IR Score: 63 — ABNORMAL HIGH (ref <=45)
Large HDL-P: <1.3 umol/L — ABNORMAL LOW (ref >=4.8)
Large VLDL-P: 2.4 nmol/L (ref <=2.7)
Small LDL Particle Number: 156 nmol/L (ref <=527)
Small LDL-P: 156 nmol/L (ref <=527)
Triglycerides: 124 mg/dL (ref 0–149)
VLDL Size: 50.9 nm — ABNORMAL HIGH (ref <=46.6)

## 2023-10-26 LAB — CBC WITH DIFFERENTIAL/PLATELET
Basophils Absolute: 0.1 x10E3/uL (ref 0.0–0.2)
Basos: 1 %
EOS (ABSOLUTE): 0.2 x10E3/uL (ref 0.0–0.4)
Eos: 2 %
Hematocrit: 55.6 % — ABNORMAL HIGH (ref 37.5–51.0)
Hemoglobin: 17.4 g/dL (ref 13.0–17.7)
Immature Grans (Abs): 0 x10E3/uL (ref 0.0–0.1)
Immature Granulocytes: 0 %
Lymphocytes Absolute: 1.6 x10E3/uL (ref 0.7–3.1)
Lymphs: 18 %
MCH: 27 pg (ref 26.6–33.0)
MCHC: 31.3 g/dL — ABNORMAL LOW (ref 31.5–35.7)
MCV: 86 fL (ref 79–97)
Monocytes Absolute: 0.6 x10E3/uL (ref 0.1–0.9)
Monocytes: 7 %
Neutrophils Absolute: 6.5 x10E3/uL (ref 1.4–7.0)
Neutrophils: 72 %
Platelets: 211 x10E3/uL (ref 150–450)
RBC: 6.44 x10E6/uL — ABNORMAL HIGH (ref 4.14–5.80)
RDW: 18.9 % — ABNORMAL HIGH (ref 11.6–15.4)
WBC: 9 x10E3/uL (ref 3.4–10.8)

## 2023-10-26 LAB — HEMOGLOBIN A1C
Est. average glucose Bld gHb Est-mCnc: 117 mg/dL
Hgb A1c MFr Bld: 5.7 % — ABNORMAL HIGH (ref 4.8–5.6)

## 2023-10-26 LAB — COMPREHENSIVE METABOLIC PANEL WITH GFR
ALT: 51 IU/L — ABNORMAL HIGH (ref 0–44)
AST: 33 IU/L (ref 0–40)
Albumin: 4.2 g/dL (ref 3.9–4.9)
Alkaline Phosphatase: 90 IU/L (ref 44–121)
BUN/Creatinine Ratio: 16 (ref 10–24)
BUN: 16 mg/dL (ref 8–27)
Bilirubin Total: 0.6 mg/dL (ref 0.0–1.2)
CO2: 20 mmol/L (ref 20–29)
Calcium: 9.4 mg/dL (ref 8.6–10.2)
Chloride: 104 mmol/L (ref 96–106)
Creatinine, Ser: 1.03 mg/dL (ref 0.76–1.27)
Globulin, Total: 2.5 g/dL (ref 1.5–4.5)
Glucose: 97 mg/dL (ref 70–99)
Potassium: 4.8 mmol/L (ref 3.5–5.2)
Sodium: 138 mmol/L (ref 134–144)
Total Protein: 6.7 g/dL (ref 6.0–8.5)
eGFR: 79 mL/min/1.73 (ref >59)

## 2023-10-27 ENCOUNTER — Ambulatory Visit: Payer: Self-pay | Admitting: Physician Assistant

## 2023-10-27 NOTE — Progress Notes (Signed)
 Complex Care Management Note Care Guide Note  10/27/2023 Name: Jacob Collier MRN: [id number] DOB: [birth year]   Complex Care Management Outreach Attempts: A third unsuccessful outreach was attempted today to offer the patient with information about available complex care management services.  Follow Up Plan:  No further outreach attempts will be made at this time. We have been unable to contact the patient to offer or enroll patient in complex care management services.  Encounter Outcome:  No Answer  Dreama [facility]  [facility], [facility] VBCI Assistant Direct Dial: [phone]  Fax: [phone]

## 2023-10-28 DIAGNOSIS — R198 Other specified symptoms and signs involving the digestive system and abdomen: Secondary | ICD-10-CM | POA: Insufficient documentation

## 2023-10-28 DIAGNOSIS — N189 Chronic kidney disease, unspecified: Secondary | ICD-10-CM | POA: Insufficient documentation

## 2023-10-28 NOTE — Assessment & Plan Note (Signed)
 Pradaxa  may exacerbate renal function. Reports lower back pain possibly related to renal issues. - Order blood work to assess kidney function.

## 2023-10-28 NOTE — Assessment & Plan Note (Signed)
 Management per specialist.

## 2023-10-28 NOTE — Assessment & Plan Note (Signed)
 Coronary artery disease treated with bypass. Good heart function. Lifestyle changes improving cardiovascular health.

## 2023-10-28 NOTE — Assessment & Plan Note (Signed)
 Chronic symptoms post-surgery, possibly due to adhesions. Gastroenterologist evaluation scheduled. - Follow up with gastroenterologist in October for evaluation of possible adhesions.

## 2023-10-28 NOTE — Assessment & Plan Note (Signed)
 Hypertension well-controlled. Weight loss contributing to improved blood pressure.

## 2023-10-28 NOTE — Assessment & Plan Note (Signed)
 Mild to moderate sleep apnea. Weight loss expected to improve symptoms. Resistant to CPAP. - Continue weight loss efforts to potentially reduce sleep apnea severity.

## 2023-10-28 NOTE — Assessment & Plan Note (Signed)
 Check labs

## 2023-11-02 DIAGNOSIS — R9721 Rising PSA following treatment for malignant neoplasm of prostate: Secondary | ICD-10-CM | POA: Diagnosis not present

## 2023-11-02 DIAGNOSIS — N5231 Erectile dysfunction following radical prostatectomy: Secondary | ICD-10-CM | POA: Diagnosis not present

## 2023-11-02 DIAGNOSIS — R350 Frequency of micturition: Secondary | ICD-10-CM | POA: Diagnosis not present

## 2023-11-07 DIAGNOSIS — H903 Sensorineural hearing loss, bilateral: Secondary | ICD-10-CM | POA: Diagnosis not present

## 2023-11-09 ENCOUNTER — Encounter: Payer: Self-pay | Admitting: Cardiovascular Disease

## 2023-11-09 ENCOUNTER — Other Ambulatory Visit: Payer: Self-pay | Admitting: Family Medicine

## 2023-11-09 ENCOUNTER — Other Ambulatory Visit: Payer: Self-pay

## 2023-11-09 DIAGNOSIS — I25110 Atherosclerotic heart disease of native coronary artery with unstable angina pectoris: Secondary | ICD-10-CM

## 2023-11-09 MED ORDER — METOPROLOL SUCCINATE ER 25 MG PO TB24: 12.5000 mg | ORAL_TABLET | Freq: Every day | ORAL | 2 refills | Status: AC

## 2023-11-14 ENCOUNTER — Telehealth (HOSPITAL_COMMUNITY): Payer: Self-pay

## 2023-11-14 NOTE — Telephone Encounter (Signed)
 Attempted to reach patient to discuss upcoming procedure, no answer. Left VM for patient to return call.

## 2023-11-16 ENCOUNTER — Ambulatory Visit: Payer: Self-pay

## 2023-11-16 ENCOUNTER — Encounter: Payer: Self-pay | Admitting: Family Medicine

## 2023-11-16 ENCOUNTER — Telehealth (INDEPENDENT_AMBULATORY_CARE_PROVIDER_SITE_OTHER): Admitting: Family Medicine

## 2023-11-16 DIAGNOSIS — K648 Other hemorrhoids: Secondary | ICD-10-CM | POA: Diagnosis not present

## 2023-11-16 DIAGNOSIS — K5904 Chronic idiopathic constipation: Secondary | ICD-10-CM | POA: Diagnosis not present

## 2023-11-16 MED ORDER — LUBIPROSTONE 24 MCG PO CAPS: 24.0000 ug | ORAL_CAPSULE | Freq: Two times a day (BID) | ORAL | 2 refills | Status: DC

## 2023-11-16 MED ORDER — HYDROCORT-PRAMOXINE (PERIANAL) 1-1 % EX FOAM: 1.0000 | Freq: Two times a day (BID) | CUTANEOUS | 3 refills | Status: AC

## 2023-11-16 NOTE — Progress Notes (Signed)
 Virtual Visit via Telephone Note   This visit type was conducted secondary to patient preference or provider felt to be most appropriate for this patient at this time.  All issues noted in this document were discussed and addressed.  No physical exam could be performed with this format.  Patient verbally consented to a telehealth visit.   Date:  11/25/2023   ID:  Jacob Collier, DOB [birth year], MRN [id number]  Patient Location: Home Provider Location: Office/Clinic  PCP:  Milon Cleaves, PA   History of Present Illness:    Patient is complaining of constipation x 3 months. Had an enema this morning and it did help. Very large stool.  Patient has had a partial colectomy. Tried dulocolax, colace, miralax have not helped.  Uses dulcolax suppositories and these will sometimes work.  Intolerant to linzess .  Internal hemorrhoids. No pain. Causing blockage of stool.  Hard, large stools.  Has an appointment with GI in [place], [place]    Past Medical History:  Diagnosis Date   Anxiety    Arthritis    Coronary artery disease    a. S/p DES to RCA in 01/2019 b. s/p CABG x4 (LIMA-LAD, reverse SVG-PDA, reverse SVG-OM1, reverse SVG-1st Diag) in 01/2021   Diverticulosis    Dyspnea    Hard of hearing    Hyperlipidemia    Hypertension    hx of elevation on lyrica , off med and now normal    Paroxysmal SVT (supraventricular tachycardia) (HCC)    Post-op atrial fibrillation    Prostate cancer (HCC)    Wears glasses     Past Surgical History:  Procedure Laterality Date   A-FLUTTER ABLATION N/A 11/23/2023   Procedure: A-FLUTTER ABLATION;  Surgeon: Mealor, Eulas BRAVO, MD;  Location: [facility] INVASIVE CV LAB;  Service: Cardiovascular;  Laterality: N/A;   ANTERIOR CERVICAL DECOMP/DISCECTOMY FUSION N/A 09/03/2021   Procedure: CERVICAL FIVE-SIX, CERVICAL SIX-SEVEN ANTERIOR CERVICAL DECOMPRESSION/DISCECTOMY FUSION;  Surgeon: Louis Shove, MD;  Location: [facility] OR;  Service: Neurosurgery;  Laterality: N/A;    CARDIAC CATHETERIZATION     with 2 stents    CARDIOVERSION N/A 08/30/2023   Procedure: CARDIOVERSION;  Surgeon: Raford Riggs, MD;  Location: [facility] INVASIVE CV LAB;  Service: Cardiovascular;  Laterality: N/A;   COLECTOMY  05/2018   COLONOSCOPY     CORONARY ARTERY BYPASS GRAFT N/A 01/19/2021   Procedure: CORONARY ARTERY BYPASS GRAFTING (CABG), ON PUMP TIMES FOUR, USING LEFT INTERNAL MAMMARY ARTERY AND ENDOSOCPICALLY HARVESTED RIGHT GREATER SAPHENOUS VEIN;  Surgeon: Shyrl Linnie KIDD, MD;  Location: [facility] OR;  Service: Open Heart Surgery;  Laterality: N/A;   CYSTOSCOPY WITH URETHRAL DILATATION N/A 01/14/2021   Procedure: CYSTOSCOPY WITH BALLOON  DILATATION;  Surgeon: Gaston Hamilton, MD;  Location: [facility] ORS;  Service: Urology;  Laterality: N/A;   EYE SURGERY  02/2020   bilaterl cataracts   IR THORACENTESIS ASP PLEURAL SPACE W/IMG GUIDE  02/02/2021   LEFT HEART CATH AND CORONARY ANGIOGRAPHY N/A 12/24/2020   Procedure: LEFT HEART CATH AND CORONARY ANGIOGRAPHY;  Surgeon: Swaziland, Peter M, MD;  Location: [facility] INVASIVE CV LAB;  Service: Cardiovascular;  Laterality: N/A;   PROSTATECTOMY     SVT ABLATION N/A 10/20/2022   Procedure: SVT ABLATION;  Surgeon: Nancey Eulas BRAVO, MD;  Location: [facility] IN[facility];  Service: Cardiovascular;  Laterality: N/A;   TEE WITHOUT CARDIOVERSION N/A 01/19/2021   Procedure: TRANSESOPHAGEAL ECHOCARDIOGRAM (TEE);  Surgeon: Shyrl Linnie KIDD, MD;  Location: [facility] OR;  Service: Open Heart Surgery;  Laterality: N/A;  TOTAL HIP ARTHROPLASTY Left 11/10/2015   Procedure: LEFT TOTAL HIP ARTHROPLASTY ANTERIOR APPROACH;  Surgeon: Lonni CINDERELLA Poli, MD;  Location: [facility] OR;  Service: Orthopedics;  Laterality: Left;    Family History  Problem Relation Age of Onset   Alzheimer's disease Mother    Stroke Father    Diabetes Sister    Colon polyps Brother    Heart attack Maternal Grandmother    Cancer Maternal Uncle        prostate   Prostate cancer Maternal Uncle     Cancer Maternal Uncle        prostate   Prostate cancer Maternal Uncle    Colon cancer Neg Hx    Esophageal cancer Neg Hx    Rectal cancer Neg Hx    Stomach cancer Neg Hx     Social History   Socioeconomic History   Marital status: Married    Spouse name: Not on file   Number of children: 0   Years of education: Not on file   Highest education level: Not on file  Occupational History   Occupation: Employed at [employer]  Tobacco Use   Smoking status: Never    Passive exposure: Never   Smokeless tobacco: Never  Vaping Use   Vaping status: Never Used  Substance and Sexual Activity   Alcohol use: Not Currently    Alcohol/week: 0.0 standard drinks of alcohol    Comment: rarely   Drug use: No   Sexual activity: Yes    Partners: Female    Birth control/protection: None  Other Topics Concern   Not on file  Social History Narrative   Not on file   Social Drivers of Health   Financial Resource Strain: Low Risk  (08/04/2022)   Overall Financial Resource Strain (CARDIA)    Difficulty of Paying Living Expenses: Not hard at all  Food Insecurity: No Food Insecurity (10/29/2022)   Hunger Vital Sign    Worried About Running Out of Food in the Last Year: Never true    Ran Out of Food in the Last Year: Never true  Transportation Needs: No Transportation Needs (10/29/2022)   PRAPARE - Administrator, Civil Service (Medical): No    Lack of Transportation (Non-Medical): No  Physical Activity: Sufficiently Active (08/04/2022)   Exercise Vital Sign    Days of Exercise per Week: 5 days    Minutes of Exercise per Session: 60 min  Stress: No Stress Concern Present (08/04/2022)   [employer] of Occupational Health - Occupational Stress Questionnaire    Feeling of Stress : Not at all  Social Connections: Moderately Isolated (08/04/2022)   Social Connection and Isolation Panel    Frequency of Communication with Friends and Family: More than three times a week    Frequency  of Social Gatherings with Friends and Family: More than three times a week    Attends Religious Services: Never    Database administrator or Organizations: No    Attends Banker Meetings: Never    Marital Status: Married  Catering manager Violence: Not At Risk (10/29/2022)   Humiliation, Afraid, Rape, and Kick questionnaire    Fear of Current or Ex-Partner: No    Emotionally Abused: No    Physically Abused: No    Sexually Abused: No    Outpatient Medications Prior to Visit  Medication Sig Dispense Refill   amLODipine  (NORVASC ) 5 MG tablet Take 1 tablet (5mg ) in the morning and 1/2 tablet (2.5mg ) in  the evening. (Patient taking differently: Take 5 mg by mouth See admin instructions. Take 5 mg in the morning and 2.5 mg in the evening.)     aspirin  EC 81 MG tablet Take 81 mg by mouth daily. Swallow whole.     clonazePAM  (KLONOPIN ) 1 MG tablet TAKE 1 TABLET BY MOUTH TWICE A DAY AS NEEDED FOR ANXIETY 60 tablet 3   Evolocumab  (REPATHA  SURECLICK) 140 MG/ML SOAJ INJECT 140 MG INTO THE SKIN EVERY 14 (FOURTEEN) DAYS. 6 mL 3   ezetimibe  (ZETIA ) 10 MG tablet TAKE 1 TABLET BY MOUTH EVERY DAY 90 tablet 1   metoprolol  succinate (TOPROL -XL) 25 MG 24 hr tablet Take 0.5 tablets (12.5 mg total) by mouth at bedtime. 45 tablet 2   dabigatran  (PRADAXA ) 150 MG CAPS capsule Take 1 capsule (150 mg total) by mouth 2 (two) times daily. 60 capsule 11   oxymetazoline (AFRIN) 0.05 % nasal spray Place 1 spray into both nostrils 2 (two) times daily as needed for congestion.     zolpidem  (AMBIEN  CR) 6.25 MG CR tablet Take 1 tablet (6.25 mg total) by mouth at bedtime as needed for sleep. Take 1-2 tablets by mouth at night for bedtime. 30 tablet 0   No facility-administered medications prior to visit.    Allergies:   Ranexa [ranolazine], Amoxil  [amoxicillin ], Lipitor  [atorvastatin ], Pravachol  [pravastatin ], Rosuvastatin , Statins, Eliquis  [apixaban ], and Xarelto  [rivaroxaban ]   Social History   Tobacco Use    Smoking status: Never    Passive exposure: Never   Smokeless tobacco: Never  Vaping Use   Vaping status: Never Used  Substance Use Topics   Alcohol use: Not Currently    Alcohol/week: 0.0 standard drinks of alcohol    Comment: rarely   Drug use: No     Review of Systems  Gastrointestinal:  Positive for abdominal pain and constipation. Negative for nausea.     Labs/Other Tests and Data Reviewed:    Recent Labs: 10/24/2023: ALT 51 11/21/2023: BUN 13; Creatinine, Ser 1.18; Hemoglobin 17.9; Platelets 227; Potassium 4.3; Sodium 139   Recent Lipid Panel Lab Results  Component Value Date/Time   CHOL 85 (L) 05/11/2023 04:46 PM   TRIG 114 05/11/2023 04:46 PM   HDL 39 (L) 05/11/2023 04:46 PM   CHOLHDL 2.2 05/11/2023 04:46 PM   CHOLHDL 4.8 06/06/2019 06:12 AM   LDLCALC 25 05/11/2023 04:46 PM    Wt Readings from Last 3 Encounters:  11/23/23 215 lb (97.5 kg)  10/24/23 212 lb (96.2 kg)  10/20/23 215 lb (97.5 kg)     Objective:    Vital Signs:  There were no vitals taken for this visit.   Physical Exam  none done as it was a video visit.   ASSESSMENT & PLAN:    Chronic idiopathic constipation Start on amitiza  24 mcg one twice daily.  Keep GI follow up.    Internal hemorrhoids Proctofoam prescription given.     No orders of the defined types were placed in this encounter.    Meds ordered this encounter  Medications   lubiprostone  (AMITIZA ) 24 MCG capsule    Sig: Take 1 capsule (24 mcg total) by mouth 2 (two) times daily with a meal.    Dispense:  60 capsule    Refill:  2   hydrocortisone -pramoxine (PROCTOFOAM-HC) rectal foam    Sig: Place 1 applicator rectally 2 (two) times daily.    Dispense:  10 g    Refill:  3    Follow Up:  In Person prn  SignedAbigail Free, MD  11/25/2023 4:33 PM    [facility]

## 2023-11-16 NOTE — Telephone Encounter (Signed)
 FYI Only or Action Required?: Action required by provider: request for appointment.  Patient was last seen in primary care on 10/24/2023 by Milon Cleaves, PA.  Called Nurse Triage reporting Constipation.  Symptoms began several weeks ago.  Interventions attempted: OTC medications: suppository.  Symptoms are: unchanged. Requests VV.  Triage Disposition: See PCP When Office is Open (Within 3 Days)  Patient/caregiver understands and will follow disposition?: Yes   Copied from CRM #[id number]. Topic: Clinical - Red Word Triage >> Nov 16, 2023  8:12 AM Berneda FALCON wrote: Red Word that prompted transfer to Nurse Triage: Pt having trouble with constipation-hemorrhoids  internally. Wife has been using suppository to try and help him but it is not helping. Patient is feeling fatigued. This has been getting worse and is feeling weak  This has been ongoing for 3 months, worsening lately.  Wife on the line-Diana Answer Assessment - Initial Assessment Questions 1. STOOL PATTERN OR FREQUENCY: How often do you have a bowel movement (BM)?  (Normal range: 3 times a day to every 3 days)  When was your last BM?       unsure 2. STRAINING: Do you have to strain to have a BM?      yes 3. ONSET: When did the constipation begin?     3 months 4. RECTAL PAIN: Does your rectum hurt when the stool comes out? If Yes, ask: Do you have hemorrhoids? How bad is the pain?  (Scale 1-10; or mild, moderate, severe)     yes 5. BM COMPOSITION: Are the stools hard?      no 6. BLOOD ON STOOLS: Has there been any blood on the toilet tissue or on the surface of the BM? If Yes, ask: When was the last time?     no 7. CHRONIC CONSTIPATION: Is this a new problem for you?  If No, ask: How long have you had this problem? (days, weeks, months)      Yes - worse 8. CHANGES IN DIET OR HYDRATION: Have there been any recent changes in your diet? How much fluids are you drinking on a daily basis?  How much have  you had to drink today?     no 9. MEDICINES: Have you been taking any new medicines? Are you taking any narcotic pain medicines? (e.g., Dilaudid , morphine , Percocet, Vicodin)     no 10. LAXATIVES: Have you been using any stool softeners, laxatives, or enemas?  If Yes, ask What are you using, how often, and when was the last time?       Suppository  11. ACTIVITY:  How much walking do you do every day?  Has your activity level decreased in the past week?        minimal 12. CAUSE: What do you think is causing the constipation?        unsure 13. MEDICAL HISTORY: Do you have a history of hemorrhoids, rectal fissures, rectal surgery, or rectal abscess?         internal 14. OTHER SYMPTOMS: Do you have any other symptoms? (e.g., abdomen pain, bloating, fever, vomiting)       no 15. PREGNANCY: Is there any chance you are pregnant? When was your last menstrual period?       N/a  Protocols used: Constipation-A-AH  Reason for Disposition  Unable to have a bowel movement (BM) without laxative or enema  Answer Assessment - Initial Assessment Questions 1. STOOL PATTERN OR FREQUENCY: How often do you have a bowel movement (BM)?  (Normal  range: 3 times a day to every 3 days)  When was your last BM?       unsure 2. STRAINING: Do you have to strain to have a BM?      yes 3. ONSET: When did the constipation begin?     3 months 4. RECTAL PAIN: Does your rectum hurt when the stool comes out? If Yes, ask: Do you have hemorrhoids? How bad is the pain?  (Scale 1-10; or mild, moderate, severe)     yes 5. BM COMPOSITION: Are the stools hard?      no 6. BLOOD ON STOOLS: Has there been any blood on the toilet tissue or on the surface of the BM? If Yes, ask: When was the last time?     no 7. CHRONIC CONSTIPATION: Is this a new problem for you?  If No, ask: How long have you had this problem? (days, weeks, months)      Yes - worse 8. CHANGES IN DIET OR HYDRATION:  Have there been any recent changes in your diet? How much fluids are you drinking on a daily basis?  How much have you had to drink today?     no 9. MEDICINES: Have you been taking any new medicines? Are you taking any narcotic pain medicines? (e.g., Dilaudid , morphine , Percocet, Vicodin)     no 10. LAXATIVES: Have you been using any stool softeners, laxatives, or enemas?  If Yes, ask What are you using, how often, and when was the last time?       Suppository  11. ACTIVITY:  How much walking do you do every day?  Has your activity level decreased in the past week?        minimal 12. CAUSE: What do you think is causing the constipation?        unsure 13. MEDICAL HISTORY: Do you have a history of hemorrhoids, rectal fissures, rectal surgery, or rectal abscess?         internal 14. OTHER SYMPTOMS: Do you have any other symptoms? (e.g., abdomen pain, bloating, fever, vomiting)       no 15. PREGNANCY: Is there any chance you are pregnant? When was your last menstrual period?       N/a  Protocols used: Constipation-A-AH

## 2023-11-20 ENCOUNTER — Other Ambulatory Visit: Payer: Self-pay | Admitting: Physician Assistant

## 2023-11-20 DIAGNOSIS — F5101 Primary insomnia: Secondary | ICD-10-CM

## 2023-11-21 ENCOUNTER — Telehealth: Payer: Self-pay

## 2023-11-21 DIAGNOSIS — I471 Supraventricular tachycardia, unspecified: Secondary | ICD-10-CM | POA: Diagnosis not present

## 2023-11-21 DIAGNOSIS — I483 Typical atrial flutter: Secondary | ICD-10-CM | POA: Diagnosis not present

## 2023-11-21 DIAGNOSIS — R002 Palpitations: Secondary | ICD-10-CM | POA: Diagnosis not present

## 2023-11-21 NOTE — Telephone Encounter (Signed)
 PA not needed per covermymeds for xarelto .

## 2023-11-22 ENCOUNTER — Telehealth: Payer: Self-pay

## 2023-11-22 ENCOUNTER — Ambulatory Visit: Payer: Self-pay | Admitting: Cardiovascular Disease

## 2023-11-22 LAB — BASIC METABOLIC PANEL WITH GFR
BUN/Creatinine Ratio: 11 (ref 10–24)
BUN: 13 mg/dL (ref 8–27)
CO2: 22 mmol/L (ref 20–29)
Calcium: 9.2 mg/dL (ref 8.6–10.2)
Chloride: 103 mmol/L (ref 96–106)
Creatinine, Ser: 1.18 mg/dL (ref 0.76–1.27)
Glucose: 96 mg/dL (ref 70–99)
Potassium: 4.3 mmol/L (ref 3.5–5.2)
Sodium: 139 mmol/L (ref 134–144)
eGFR: 67 mL/min/1.73 (ref >59)

## 2023-11-22 LAB — CBC
Hematocrit: 56.9 % — ABNORMAL HIGH (ref 37.5–51.0)
Hemoglobin: 17.9 g/dL — ABNORMAL HIGH (ref 13.0–17.7)
MCH: 27.5 pg (ref 26.6–33.0)
MCHC: 31.5 g/dL (ref 31.5–35.7)
MCV: 88 fL (ref 79–97)
Platelets: 227 x10E3/uL (ref 150–450)
RBC: 6.5 x10E6/uL — ABNORMAL HIGH (ref 4.14–5.80)
RDW: 17.2 % — ABNORMAL HIGH (ref 11.6–15.4)
WBC: 8.7 x10E3/uL (ref 3.4–10.8)

## 2023-11-22 NOTE — Telephone Encounter (Signed)
 Pt's Aflutter Ablation with Dr. Nancey has been moved from 10/7 to 9/18 at 12:30 pm. He is aware that he will need to arrive at 10:30 am.   Labs are complete.   I will send instruction letter via MyChart per pt's request.

## 2023-11-22 NOTE — Telephone Encounter (Signed)
 Called pt to offer sooner date for Aflutter Ablation - No answer - LM on VM to call back if interested.

## 2023-11-23 ENCOUNTER — Ambulatory Visit (HOSPITAL_COMMUNITY)
Admission: RE | Disposition: A | Discharge status: Home / Self Care | Payer: Self-pay | Source: Home / Self Care | Attending: Cardiovascular Disease

## 2023-11-23 ENCOUNTER — Ambulatory Visit (HOSPITAL_COMMUNITY)
Admission: RE | Admit: 2023-11-23 | Discharge: 2023-11-23 | Disposition: A | Discharge status: Home / Self Care | Attending: Cardiovascular Disease | Admitting: Cardiovascular Disease

## 2023-11-23 ENCOUNTER — Ambulatory Visit (HOSPITAL_COMMUNITY): Payer: Self-pay

## 2023-11-23 ENCOUNTER — Other Ambulatory Visit: Payer: Self-pay

## 2023-11-23 DIAGNOSIS — I2510 Atherosclerotic heart disease of native coronary artery without angina pectoris: Secondary | ICD-10-CM | POA: Insufficient documentation

## 2023-11-23 DIAGNOSIS — I10 Essential (primary) hypertension: Secondary | ICD-10-CM | POA: Insufficient documentation

## 2023-11-23 DIAGNOSIS — Z86711 Personal history of pulmonary embolism: Secondary | ICD-10-CM | POA: Diagnosis not present

## 2023-11-23 DIAGNOSIS — I129 Hypertensive chronic kidney disease with stage 1 through stage 4 chronic kidney disease, or unspecified chronic kidney disease: Secondary | ICD-10-CM | POA: Diagnosis not present

## 2023-11-23 DIAGNOSIS — I4892 Unspecified atrial flutter: Secondary | ICD-10-CM

## 2023-11-23 DIAGNOSIS — Z7901 Long term (current) use of anticoagulants: Secondary | ICD-10-CM | POA: Insufficient documentation

## 2023-11-23 DIAGNOSIS — G4730 Sleep apnea, unspecified: Secondary | ICD-10-CM | POA: Insufficient documentation

## 2023-11-23 DIAGNOSIS — Z86718 Personal history of other venous thrombosis and embolism: Secondary | ICD-10-CM | POA: Insufficient documentation

## 2023-11-23 DIAGNOSIS — Z79899 Other long term (current) drug therapy: Secondary | ICD-10-CM | POA: Diagnosis not present

## 2023-11-23 DIAGNOSIS — Z951 Presence of aortocoronary bypass graft: Secondary | ICD-10-CM | POA: Diagnosis not present

## 2023-11-23 DIAGNOSIS — N189 Chronic kidney disease, unspecified: Secondary | ICD-10-CM

## 2023-11-23 DIAGNOSIS — I4719 Other supraventricular tachycardia: Secondary | ICD-10-CM | POA: Diagnosis not present

## 2023-11-23 DIAGNOSIS — Z7982 Long term (current) use of aspirin: Secondary | ICD-10-CM | POA: Diagnosis not present

## 2023-11-23 DIAGNOSIS — Z7902 Long term (current) use of antithrombotics/antiplatelets: Secondary | ICD-10-CM | POA: Insufficient documentation

## 2023-11-23 DIAGNOSIS — Z955 Presence of coronary angioplasty implant and graft: Secondary | ICD-10-CM | POA: Diagnosis not present

## 2023-11-23 HISTORY — PX: A-FLUTTER ABLATION: EP1230

## 2023-11-23 SURGERY — A-FLUTTER ABLATION
Anesthesia: General

## 2023-11-23 MED ORDER — SODIUM CHLORIDE 0.9 % IV SOLN: INTRAVENOUS | Status: DC

## 2023-11-23 MED ORDER — MIDAZOLAM HCL 5 MG/5ML IJ SOLN
INTRAMUSCULAR | Status: DC | PRN
  Administered 2023-11-23: 2 mg via INTRAVENOUS

## 2023-11-23 MED ORDER — MIDAZOLAM HCL 2 MG/2ML IJ SOLN
INTRAMUSCULAR | Status: AC
  Filled 2023-11-23: qty 2

## 2023-11-23 MED ORDER — FENTANYL CITRATE (PF) 100 MCG/2ML IJ SOLN
INTRAMUSCULAR | Status: AC
  Filled 2023-11-23: qty 2

## 2023-11-23 MED ORDER — SODIUM CHLORIDE 0.9 % IV SOLN: INTRAVENOUS | Status: DC | PRN

## 2023-11-23 MED ORDER — ROCURONIUM BROMIDE 10 MG/ML (PF) SYRINGE
PREFILLED_SYRINGE | INTRAVENOUS | Status: DC | PRN
  Administered 2023-11-23: 60 mg via INTRAVENOUS
  Administered 2023-11-23: 20 mg via INTRAVENOUS

## 2023-11-23 MED ORDER — DEXAMETHASONE SODIUM PHOSPHATE 10 MG/ML IJ SOLN
INTRAMUSCULAR | Status: DC | PRN
  Administered 2023-11-23: 10 mg via INTRAVENOUS

## 2023-11-23 MED ORDER — ACETAMINOPHEN 325 MG PO TABS: 650.0000 mg | ORAL_TABLET | ORAL | Status: DC | PRN

## 2023-11-23 MED ORDER — ACETAMINOPHEN 500 MG PO TABS
1000.0000 mg | ORAL_TABLET | Freq: Once | ORAL | Status: AC
  Administered 2023-11-23: 1000 mg via ORAL
  Filled 2023-11-23: qty 2

## 2023-11-23 MED ORDER — LIDOCAINE 2% (20 MG/ML) 5 ML SYRINGE
INTRAMUSCULAR | Status: DC | PRN
  Administered 2023-11-23: 40 mg via INTRAVENOUS

## 2023-11-23 MED ORDER — HEPARIN (PORCINE) IN NACL 1000-0.9 UT/500ML-% IV SOLN
INTRAVENOUS | Status: DC | PRN
  Administered 2023-11-23: 500 mL

## 2023-11-23 MED ORDER — PROPOFOL 10 MG/ML IV BOLUS
INTRAVENOUS | Status: DC | PRN
Start: 2023-11-23 — End: 2023-11-23
  Administered 2023-11-23: 130 mg via INTRAVENOUS

## 2023-11-23 MED ORDER — ONDANSETRON HCL 4 MG/2ML IJ SOLN
INTRAMUSCULAR | Status: DC | PRN
  Administered 2023-11-23: 4 mg via INTRAVENOUS

## 2023-11-23 MED ORDER — PHENYLEPHRINE HCL-NACL 20-0.9 MG/250ML-% IV SOLN
INTRAVENOUS | Status: DC | PRN
  Administered 2023-11-23: 40 ug/min via INTRAVENOUS

## 2023-11-23 MED ORDER — ONDANSETRON HCL 4 MG/2ML IJ SOLN: 4.0000 mg | Freq: Four times a day (QID) | INTRAMUSCULAR | Status: DC | PRN

## 2023-11-23 MED ORDER — SUGAMMADEX SODIUM 200 MG/2ML IV SOLN
INTRAVENOUS | Status: DC | PRN
  Administered 2023-11-23: 200 mg via INTRAVENOUS

## 2023-11-23 MED ORDER — FENTANYL CITRATE (PF) 100 MCG/2ML IJ SOLN
INTRAMUSCULAR | Status: DC | PRN
  Administered 2023-11-23 (×2): 50 ug via INTRAVENOUS

## 2023-11-23 SURGICAL SUPPLY — 10 items
CATH DECANAV D CURVE (CATHETERS) IMPLANT
CATH EZ STEER NAV 8MM F-J CUR (ABLATOR) IMPLANT
CATH GE 8FR SOUNDSTAR (CATHETERS) IMPLANT
DEVICE CLOSURE MYNXGRIP 6/7F (Vascular Products) IMPLANT
PACK EP LF (CUSTOM PROCEDURE TRAY) ×1 IMPLANT
PAD DEFIB RADIO PHYSIO CONN (PAD) ×1 IMPLANT
PATCH CARTO3 (PAD) IMPLANT
SHEATH PINNACLE 8F 10CM (SHEATH) IMPLANT
SHEATH PINNACLE 9F 10CM (SHEATH) IMPLANT
SHEATH SWARTZ RAMP 8.5F 60CM (SHEATH) IMPLANT

## 2023-11-23 NOTE — H&P (Signed)
 Electrophysiology Office Note:    Date:  11/23/2023   ID:  Jacob Collier, DOB Jul 26, 1955, MRN [id number]  PCP:  Jacob Cleaves, PA    [facility] Providers Cardiologist:  Jacob Swaziland, MD Cardiology APP:  Jacob Aline BRAVO, PA-C  Electrophysiologist:  Jacob Collier Furbish, MD     Referring MD: Collier Jacob BRAVO, MD   History of Present Illness:    Jacob Collier is a 68 y.o. male with a hx listed below, significant for paroxysmal SVT, referred for arrhythmia management.  He recently wore a monitor that detected occasional episodes of tachycardia. The longest lasted almost 15 minutes.  Atrial activity is difficult to discern due to artifact.  He has had rapid and irregular rhythms for years, predating his CABG.  Seems like they have gotten worse recently.  He is particular bothered by rapid rates which last for a few minutes.  He uses a pulse ox and it frequently shows rates of about 145 bpm.  These occur with rapid onset but oftentimes slow down gradually.  He also notices occasional irregular rhythms.  He has not had syncope.  He has had edema with amlodipine .  He does not tolerate higher doses of beta-blocker very well.  He underwent successful ablation of AVNRT, but it was complicated by DVT after the procedure and he was placed on Eliquis .  He was referred to the ER from his primary care doctor on June 25 with atrial flutter.  He had minimal symptoms with a flutter and underwent cardioversion later that month.  He has not been tolerating his Pradaxa  very well and has had GI upset and back pain.  The symptoms resolved when he held his Pradaxa  temporarily. He tried xarelto  and did worse, so went back to pradaxa .  He presents today for ablation. Other than the issue with blood thinners, he has not had any changes.   EKGs/Labs/Other Studies Reviewed Today:    Echocardiogram:  TEE 02/01/21 EF 50% with mildly hypokinetic inferior wall    Monitors:  ZioXT 11 days  04/2022 Sinus rhythm HR 47-99 bpm, avg 69. 2% PACs  Multiple episodes of narrow complex tachycardia One strip (2/10 10:11:03) appears to show a short R-P  EKG:         Recent Labs: 10/24/2023: ALT 51 11/21/2023: BUN 13; Creatinine, Ser 1.18; Hemoglobin 17.9; Platelets 227; Potassium 4.3; Sodium 139     Physical Exam:    VS:  BP (!) 140/77   Pulse 65   Temp 97.7 F (36.5 C) (Oral)   Resp 18   Ht 6' (1.829 m)   Wt 97.5 kg   SpO2 97%   BMI 29.16 kg/m     Wt Readings from Last 3 Encounters:  11/23/23 97.5 kg  10/24/23 96.2 kg  10/20/23 97.5 kg     GEN: Well nourished, well developed in no acute distress CARDIAC: RRR, no murmurs, rubs, gallops RESPIRATORY:  Normal work of breathing MUSCULOSKELETAL: no edema    ASSESSMENT & PLAN:    Atrial flutter Documented on EKG August 22, 2023 We discussed management options, and using a shared decision making approach decided to proceed with mapping and ablation of the flutter He has not well tolerating anticoagulation, so we will hope to discontinue this in the future -- will plan to DC pradaxa  and resume plavix  after about a month if he maintains sinus rhythm.  We discussed the indication, rationale, logistics, anticipated benefits, and potential risks of the ablation procedure including but not limited  to -- bleed at the groin access site, chest pain, damage to nearby organs such as the diaphragm, lungs, or esophagus, need for a drainage tube, or prolonged hospitalization. I explained that the risk for stroke, heart attack, need for open chest surgery, or even death is very low but not zero. he  expressed understanding and wishes to proceed.   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  Currently on Xarelto  and aspirin  (Plavix  held) due to PE Continue metoprolol             Medication Adjustments/Labs and Tests Ordered: Current medicines are reviewed at length with the patient today.  Concerns regarding medicines are outlined above.  Orders Placed This Encounter  Procedures   EP STUDY   No orders of the defined types were placed in this encounter.    Signed, Jacob FORBES Furbish, MD  11/23/2023 10:50 AM    [facility]

## 2023-11-23 NOTE — Progress Notes (Signed)
 Discharge instructions reviewed with patient and wife at the bedside. Denies questions or concerns PT was able to void prior to discharge. Ambulated in the hallway without difficulty. No s/s of complications at incision site. PT escorted from the unit via wheel chair to personal vehicle.

## 2023-11-23 NOTE — Anesthesia Preprocedure Evaluation (Addendum)
 Anesthesia Evaluation  Patient identified by MRN, date of birth, ID band Patient awake    Reviewed: Allergy & Precautions, NPO status , Patient's Chart, lab work & pertinent test results, reviewed documented beta blocker date and time   History of Anesthesia Complications Negative for: history of anesthetic complications  Airway Mallampati: I  TM Distance: >3 FB Neck ROM: Full    Dental  (+) Dental Advisory Given, Caps   Pulmonary sleep apnea (does not yet use CPAP)    breath sounds clear to auscultation       Cardiovascular hypertension, Pt. on medications and Pt. on home beta blockers (-) angina + CAD, + Cardiac Stents and + CABG  + dysrhythmias Atrial Fibrillation and Supra Ventricular Tachycardia  Rhythm:Regular Rate:Normal  '24 ECHO: EF 50 to 55%. 1. LV EF 54 %. The left ventricle has low normal function, no regional wall motion  abnormalities. There is mild concentric LVH. Grade I diastolic dysfunction (impaired relaxation). There is moderate hypokinesis of the left ventricular, basal-mid inferior wall.   2. RVF is mildly reduced. The right ventricular size is mildly enlarged. There is normal pulmonary artery systolic pressure. The estimated right ventricular systolic pressure is 22.0 mmHg.   3. Possible atrial level shunting consistent with recent ablation. cannot exclude a small PFO.   4. The mitral valve is normal in structure. Trivial mitral valve regurgitation.   5. The aortic valve is tricuspid. Aortic valve regurgitation is not visualized.     Neuro/Psych   Anxiety     negative neurological ROS     GI/Hepatic negative GI ROS, Neg liver ROS,,,  Endo/Other  negative endocrine ROS    Renal/GU Renal InsufficiencyRenal disease     Musculoskeletal  (+) Arthritis ,    Abdominal   Peds  Hematology Plavix  Pradaxa  Hb 17.9, plt 227k   Anesthesia Other Findings   Reproductive/Obstetrics                               Anesthesia Physical Anesthesia Plan  ASA: 3  Anesthesia Plan: General   Post-op Pain Management: Tylenol  PO (pre-op)*   Induction: Intravenous  PONV Risk Score and Plan: 3 and Ondansetron  and Dexamethasone   Airway Management Planned: Oral ETT  Additional Equipment: None  Intra-op Plan:   Post-operative Plan: Extubation in OR  Informed Consent: I have reviewed the patients History and Physical, chart, labs and discussed the procedure including the risks, benefits and alternatives for the proposed anesthesia with the patient or authorized representative who has indicated his/her understanding and acceptance.     Dental advisory given  Plan Discussed with: CRNA and Surgeon  Anesthesia Plan Comments:          Anesthesia Quick Evaluation

## 2023-11-23 NOTE — Discharge Instructions (Signed)

## 2023-11-23 NOTE — Transfer of Care (Cosign Needed)
 Immediate Anesthesia Transfer of Care Note  Patient: Jacob Collier  Procedure(s) Performed: A-FLUTTER ABLATION  Patient Location: PACU and Cath Lab  Anesthesia Type:General  Level of Consciousness: awake and oriented  Airway & Oxygen Therapy: Patient Spontanous Breathing and Patient connected to face mask oxygen  Post-op Assessment: Report given to RN and Post -op Vital signs reviewed and stable  Post vital signs: Reviewed and stable  Last Vitals:  Vitals Value Taken Time  BP 116/72 11/23/23 12:54  Temp    Pulse 65 11/23/23 12:58  Resp 13 11/23/23 12:58  SpO2 95 % 11/23/23 12:58  Vitals shown include unfiled device data.  Last Pain:  Vitals:   11/23/23 1108  TempSrc:   PainSc: 0-No pain         Complications: There were no known notable events for this encounter.

## 2023-11-23 NOTE — Anesthesia Postprocedure Evaluation (Signed)
 Anesthesia Post Note  Patient: MALIEK SCHELLHORN  Procedure(s) Performed: A-FLUTTER ABLATION     Patient location during evaluation: Cath Lab Anesthesia Type: General Level of consciousness: awake and alert, oriented and patient cooperative Pain management: pain level controlled Vital Signs Assessment: post-procedure vital signs reviewed and stable Respiratory status: spontaneous breathing, nonlabored ventilation and respiratory function stable Cardiovascular status: blood pressure returned to baseline and stable Postop Assessment: no apparent nausea or vomiting Anesthetic complications: no   There were no known notable events for this encounter.  Last Vitals:  Vitals:   11/23/23 1300 11/23/23 1307  BP: 122/78   Pulse: 65 66  Resp: 15 14  Temp: 36.7 C   SpO2: 95% 93%    Last Pain:  Vitals:   11/23/23 1300  TempSrc: Oral  PainSc:                  Donnarae Rae,E. Kayon Dozier

## 2023-11-23 NOTE — Anesthesia Procedure Notes (Signed)
 Procedure Name: Intubation Date/Time: 11/23/2023 11:45 AM  Performed by: Jerl Donald LABOR, CRNAPre-anesthesia Checklist: Patient identified, Emergency Drugs available, Suction available and Patient being monitored Patient Re-evaluated:Patient Re-evaluated prior to induction Oxygen Delivery Method: Circle System Utilized Preoxygenation: Pre-oxygenation with 100% oxygen Induction Type: IV induction Ventilation: Mask ventilation without difficulty Laryngoscope Size: Mac and 4 Grade View: Grade I Tube type: Oral Tube size: 7.5 mm Number of attempts: 1 Airway Equipment and Method: Stylet and Oral airway Placement Confirmation: ETT inserted through vocal cords under direct vision, positive ETCO2 and breath sounds checked- equal and bilateral Secured at: 22 cm Tube secured with: Tape Dental Injury: Teeth and Oropharynx as per pre-operative assessment

## 2023-11-24 ENCOUNTER — Encounter (HOSPITAL_COMMUNITY): Payer: Self-pay | Admitting: Cardiovascular Disease

## 2023-11-24 ENCOUNTER — Telehealth (HOSPITAL_COMMUNITY): Payer: Self-pay

## 2023-11-24 MED FILL — Midazolam HCl Inj 2 MG/2ML (Base Equivalent): INTRAMUSCULAR | Qty: 2 | Status: AC

## 2023-11-24 NOTE — Telephone Encounter (Signed)
 Attempted to reach patient to follow up with procedure completed on 11/23/23, no answer. Left VM for patient to return call.

## 2023-11-25 ENCOUNTER — Encounter: Payer: Self-pay | Admitting: Family Medicine

## 2023-11-25 DIAGNOSIS — K5904 Chronic idiopathic constipation: Secondary | ICD-10-CM | POA: Insufficient documentation

## 2023-11-25 DIAGNOSIS — K648 Other hemorrhoids: Secondary | ICD-10-CM | POA: Insufficient documentation

## 2023-11-25 NOTE — Assessment & Plan Note (Signed)
 Proctofoam prescription given.

## 2023-11-25 NOTE — Assessment & Plan Note (Signed)
 Start on amitiza  24 mcg one twice daily.  Keep GI follow up.

## 2023-12-14 ENCOUNTER — Ambulatory Visit

## 2023-12-14 VITALS — Ht 72.0 in | Wt 215.0 lb

## 2023-12-14 DIAGNOSIS — Z0000 Encounter for general adult medical examination without abnormal findings: Secondary | ICD-10-CM

## 2023-12-14 NOTE — Progress Notes (Signed)
 Subjective:   Jacob Collier is a 68 y.o. who presents for a Medicare Wellness preventive visit.  As a reminder, Annual Wellness Visits don't include a physical exam, and some assessments may be limited, especially if this visit is performed virtually. We may recommend an in-person follow-up visit with your provider if needed.  Visit Complete: Virtual I connected with  Jacob Collier on 12/14/23 by a audio enabled telemedicine application and verified that I am speaking with the correct person using two identifiers.  Patient Location: Home  Provider Location: Home Office  I discussed the limitations of evaluation and management by telemedicine. The patient expressed understanding and agreed to proceed.  Vital Signs: Because this visit was a virtual/telehealth visit, some criteria may be missing or patient reported. Any vitals not documented were not able to be obtained and vitals that have been documented are patient reported.  VideoDeclined- This patient declined Librarian, academic. Therefore the visit was completed with audio only.  Persons Participating in Visit: Patient.  AWV Questionnaire: No: Patient Medicare AWV questionnaire was not completed prior to this visit.  Cardiac Risk Factors include: advanced age (>66men, >2 women);male gender;dyslipidemia;hypertension     Objective:    Today's Vitals   12/14/23 1010  Weight: 215 lb (97.5 kg)  Height: 6' (1.829 m)   Body mass index is 29.16 kg/m.     12/14/2023   10:12 AM 11/23/2023   11:05 AM 08/30/2023    7:51 AM 08/07/2023   12:25 PM 12/04/2022    4:40 AM 10/29/2022    2:46 AM 10/28/2022    8:14 PM  Advanced Directives  Does Patient Have a Medical Advance Directive? Yes Yes Yes No No No No  Type of Advance Directive Living will Living will Healthcare Power of [employer];Living will      Does patient want to make changes to medical advance directive? Yes (MAU/Ambulatory/Procedural Areas -  Information given) No - Patient declined       Would patient like information on creating a medical advance directive?    No - Patient declined  Yes (Inpatient - patient requests chaplain consult to create a medical advance directive)     Current Medications (verified) Outpatient Encounter Medications as of 12/14/2023  Medication Sig   amLODipine  (NORVASC ) 5 MG tablet Take 1 tablet (5mg ) in the morning and 1/2 tablet (2.5mg ) in the evening. (Patient taking differently: Take 5 mg by mouth See admin instructions. Take 5 mg in the morning and 2.5 mg in the evening.)   aspirin  EC 81 MG tablet Take 81 mg by mouth daily. Swallow whole.   clonazePAM  (KLONOPIN ) 1 MG tablet TAKE 1 TABLET BY MOUTH TWICE A DAY AS NEEDED FOR ANXIETY   clopidogrel  (PLAVIX ) 75 MG tablet Take 75 mg by mouth daily.   dabigatran  (PRADAXA ) 150 MG CAPS capsule Take 150 mg by mouth 2 (two) times daily.   Evolocumab  (REPATHA  SURECLICK) 140 MG/ML SOAJ INJECT 140 MG INTO THE SKIN EVERY 14 (FOURTEEN) DAYS.   ezetimibe  (ZETIA ) 10 MG tablet TAKE 1 TABLET BY MOUTH EVERY DAY   hydrocortisone -pramoxine (PROCTOFOAM-HC) rectal foam Place 1 applicator rectally 2 (two) times daily. (Patient taking differently: Place 1 applicator rectally 2 (two) times daily as needed for hemorrhoids.)   lubiprostone  (AMITIZA ) 24 MCG capsule Take 1 capsule (24 mcg total) by mouth 2 (two) times daily with a meal. (Patient taking differently: Take 24 mcg by mouth 2 (two) times daily as needed for constipation.)   metoprolol   succinate (TOPROL -XL) 25 MG 24 hr tablet Take 0.5 tablets (12.5 mg total) by mouth at bedtime.   PRESCRIPTION MEDICATION 1 application  daily as needed (ED). [facility] - [place], [place] - [address]  TRIMIX PAPAVERINE/PHENTOLAMINE/ALPROSTADIL 30MG /1MG /10MCG/ML   zolpidem  (AMBIEN  CR) 6.25 MG CR tablet Take 1 tablet (6.25 mg total) by mouth at bedtime as needed for sleep. Take 1-2 tablets by mouth at night for bedtime. (Patient not  taking: Reported on 12/14/2023)   No facility-administered encounter medications on file as of 12/14/2023.    Allergies (verified) Ranexa [ranolazine], Amoxil  [amoxicillin ], Lipitor  [atorvastatin ], Pravachol  [pravastatin ], Rosuvastatin , Statins, Eliquis  [apixaban ], and Xarelto  [rivaroxaban ]   History: Past Medical History:  Diagnosis Date   Anxiety    Arthritis    Coronary artery disease    a. S/p DES to RCA in 01/2019 b. s/p CABG x4 (LIMA-LAD, reverse SVG-PDA, reverse SVG-OM1, reverse SVG-1st Diag) in 01/2021   Diverticulosis    Dyspnea    Hard of hearing    Hyperlipidemia    Hypertension    hx of elevation on lyrica , off med and now normal    Paroxysmal SVT (supraventricular tachycardia)    Post-op atrial fibrillation    Prostate cancer (HCC)    Wears glasses    Past Surgical History:  Procedure Laterality Date   A-FLUTTER ABLATION N/A 11/23/2023   Procedure: A-FLUTTER ABLATION;  Surgeon: Nancey Eulas BRAVO, MD;  Location: [facility] INVASIVE CV LAB;  Service: Cardiovascular;  Laterality: N/A;   ANTERIOR CERVICAL DECOMP/DISCECTOMY FUSION N/A 09/03/2021   Procedure: CERVICAL FIVE-SIX, CERVICAL SIX-SEVEN ANTERIOR CERVICAL DECOMPRESSION/DISCECTOMY FUSION;  Surgeon: Louis Shove, MD;  Location: [facility] OR;  Service: Neurosurgery;  Laterality: N/A;   CARDIAC CATHETERIZATION     with 2 stents    CARDIOVERSION N/A 08/30/2023   Procedure: CARDIOVERSION;  Surgeon: Raford Riggs, MD;  Location: [facility] INVASIVE CV LAB;  Service: Cardiovascular;  Laterality: N/A;   COLECTOMY  05/2018   COLONOSCOPY     CORONARY ARTERY BYPASS GRAFT N/A 01/19/2021   Procedure: CORONARY ARTERY BYPASS GRAFTING (CABG), ON PUMP TIMES FOUR, USING LEFT INTERNAL MAMMARY ARTERY AND ENDOSOCPICALLY HARVESTED RIGHT GREATER SAPHENOUS VEIN;  Surgeon: Shyrl Linnie KIDD, MD;  Location: [facility] OR;  Service: Open Heart Surgery;  Laterality: N/A;   CYSTOSCOPY WITH URETHRAL DILATATION N/A 01/14/2021   Procedure: CYSTOSCOPY WITH BALLOON   DILATATION;  Surgeon: Gaston Hamilton, MD;  Location: [facility] ORS;  Service: Urology;  Laterality: N/A;   EYE SURGERY  02/2020   bilaterl cataracts   IR THORACENTESIS ASP PLEURAL SPACE W/IMG GUIDE  02/02/2021   LEFT HEART CATH AND CORONARY ANGIOGRAPHY N/A 12/24/2020   Procedure: LEFT HEART CATH AND CORONARY ANGIOGRAPHY;  Surgeon: Swaziland, Peter M, MD;  Location: [facility] INVASIVE CV LAB;  Service: Cardiovascular;  Laterality: N/A;   PROSTATECTOMY     SVT ABLATION N/A 10/20/2022   Procedure: SVT ABLATION;  Surgeon: Nancey Eulas BRAVO, MD;  Location: [facility]AS[facility];  Service: Cardiovascular;  Laterality: N/A;   TEE WITHOUT CARDIOVERSION N/A 01/19/2021   Procedure: TRANSESOPHAGEAL ECHOCARDIOGRAM (TEE);  Surgeon: Shyrl Linnie KIDD, MD;  Location: [facility] OR;  Service: Open Heart Surgery;  Laterality: N/A;   TOTAL HIP ARTHROPLASTY Left 11/10/2015   Procedure: LEFT TOTAL HIP ARTHROPLASTY ANTERIOR APPROACH;  Surgeon: Lonni CINDERELLA Poli, MD;  Location: [facility] OR;  Service: Orthopedics;  Laterality: Left;   Family History  Problem Relation Age of Onset   Alzheimer's disease Mother    Stroke Father    Diabetes Sister  Colon polyps Brother    Heart attack Maternal Grandmother    Cancer Maternal Uncle        prostate   Prostate cancer Maternal Uncle    Cancer Maternal Uncle        prostate   Prostate cancer Maternal Uncle    Colon cancer Neg Hx    Esophageal cancer Neg Hx    Rectal cancer Neg Hx    Stomach cancer Neg Hx    Social History   Socioeconomic History   Marital status: Married    Spouse name: Not on file   Number of children: 0   Years of education: Not on file   Highest education level: Not on file  Occupational History   Occupation: Employed at [employer]  Tobacco Use   Smoking status: Never    Passive exposure: Never   Smokeless tobacco: Never  Vaping Use   Vaping status: Never Used  Substance and Sexual Activity   Alcohol use: Not Currently    Alcohol/week: 0.0  standard drinks of alcohol    Comment: rarely   Drug use: No   Sexual activity: Yes    Partners: Female    Birth control/protection: None  Other Topics Concern   Not on file  Social History Narrative   Not on file   Social Drivers of Health   Financial Resource Strain: Low Risk  (12/14/2023)   Overall Financial Resource Strain (CARDIA)    Difficulty of Paying Living Expenses: Not hard at all  Food Insecurity: No Food Insecurity (12/14/2023)   Hunger Vital Sign    Worried About Running Out of Food in the Last Year: Never true    Ran Out of Food in the Last Year: Never true  Transportation Needs: No Transportation Needs (12/14/2023)   PRAPARE - Administrator, Civil Service (Medical): No    Lack of Transportation (Non-Medical): No  Physical Activity: Insufficiently Active (12/14/2023)   Exercise Vital Sign    Days of Exercise per Week: 3 days    Minutes of Exercise per Session: 30 min  Stress: No Stress Concern Present (12/14/2023)   [employer] of Occupational Health - Occupational Stress Questionnaire    Feeling of Stress: Not at all  Social Connections: Moderately Isolated (12/14/2023)   Social Connection and Isolation Panel    Frequency of Communication with Friends and Family: More than three times a week    Frequency of Social Gatherings with Friends and Family: More than three times a week    Attends Religious Services: Never    Database administrator or Organizations: No    Attends Engineer, structural: Never    Marital Status: Married    Tobacco Counseling Counseling given: Not Answered    Clinical Intake:  Pre-visit preparation completed: Yes  Pain : No/denies pain  Diabetes: No  Lab Results  Component Value Date   HGBA1C 5.7 (H) 10/24/2023   HGBA1C 6.0 (H) 05/11/2023   HGBA1C 5.8 (H) 03/04/2021     How often do you need to have someone help you when you read instructions, pamphlets, or other written materials from your doctor  or pharmacy?: 1 - Never  Interpreter Needed?: No  Information entered by :: Charmaine Bloodgood LPN   Activities of Daily Living     12/14/2023   10:11 AM 08/30/2023    7:51 AM  In your present state of health, do you have any difficulty performing the following activities:  Hearing? 0 0  Vision? 0 0  Difficulty concentrating or making decisions? 0 0  Walking or climbing stairs? 0   Dressing or bathing? 0   Doing errands, shopping? 0   Preparing Food and eating ? N   Using the Toilet? N   In the past six months, have you accidently leaked urine? N   Do you have problems with loss of bowel control? N   Managing your Medications? N   Managing your Finances? N   Housekeeping or managing your Housekeeping? N     Patient Care Team: Milon Cleaves, [name] as PCP - General (Physician Assistant) Mealor, Eulas BRAVO, MD as PCP - Electrophysiology (Cardiology) Swazil[place], Peter M, MD as PCP - Cardiology (Cardiology) Carolee Sherwood JONETTA DOUGLAS, MD as Consulting Physician (Urology) Goodrich, Callie E, PA-C as Physician Assistant (Cardiology) Malcolm Toribio DASEN, MD as Referring Physician (Neurology) Jeannetta Lonni ORN, MD as Consulting Physician (Rheumatology) Federico Norleen DASEN MADISON, MD as Consulting Physician (Hematology and Oncology) Louis Shove, MD as Consulting Physician (Neurosurgery)  I have updated your Care Teams any recent Medical Services you may have received from other providers in the past year.     Assessment:   This is a routine wellness examination for Jacob Collier.  Hearing/Vision screen Hearing Screening - Comments:: Patient is able to hear conversational tones without difficulty. No issues reported.   Vision Screening - Comments:: up to date with routine eye exams with provider in [place]    Goals Addressed             This Visit's Progress    Maintain health and independence   On track      Depression Screen     12/14/2023   10:17 AM 05/11/2023    3:41 PM 11/03/2022   10:40 AM  07/27/2022    3:21 PM 11/20/2020   11:03 AM 10/26/2020    1:52 PM 05/19/2020    1:40 PM  PHQ 2/9 Scores  PHQ - 2 Score 0 0 0 0 0 0 0  PHQ- 9 Score 4  1 1        Fall Risk     12/14/2023   10:13 AM 05/11/2023    3:41 PM 11/03/2022   10:40 AM 07/27/2022    3:21 PM 03/04/2021    8:15 AM  Fall Risk   Falls in the past year? 0 0 0 0 0  Number falls in past yr: 0 0 0 0 0  Injury with Fall? 0 0 0 0 0  Risk for fall due to : No Fall Risks No Fall Risks  Impaired balance/gait No Fall Risks  Follow up Falls prevention discussed;Education provided;Falls evaluation completed  Falls evaluation completed Education provided Falls evaluation completed      Data saved with a previous flowsheet row definition    MEDICARE RISK AT HOME:  Medicare Risk at Home Any stairs in or around the home?: Yes If so, are there any without handrails?: No Home free of loose throw rugs in walkways, pet beds, electrical cords, etc?: Yes Adequate lighting in your home to reduce risk of falls?: Yes Life alert?: No Use of a cane, walker or w/c?: No Grab bars in the bathroom?: Yes Shower chair or bench in shower?: No Elevated toilet seat or a handicapped toilet?: Yes  TIMED UP AND GO:  Was the test performed?  No  Cognitive Function: 6CIT completed        12/14/2023   10:13 AM  6CIT Screen  What Year? 0 points  What month? 0 points  What time? 0 points  Count back from 20 0 points  Months in reverse 0 points  Repeat phrase 0 points  Total Score 0 points    Immunizations Immunization History  Administered Date(s) Administered   Influenza-Unspecified 01/05/2019, 01/02/2020   Janssen (J&J) SARS-COV-2 Vaccination 07/10/2019   PNEUMOCOCCAL CONJUGATE-20 11/02/2022    Screening Tests Health Maintenance  Topic Date Due   DTaP/Tdap/Td (1 - Tdap) Never done   Zoster Vaccines- Shingrix (1 of 2) Never done   Influenza Vaccine  10/06/2023   Medicare Annual Wellness (AWV)  12/13/2024   Colonoscopy   07/02/2027   Pneumococcal Vaccine: 50+ Years  Completed   Meningococcal B Vaccine  Aged Out   COVID-19 Vaccine  Discontinued   Hepatitis C Screening  Discontinued    Health Maintenance Items Addressed: Vaccines Due: Flu, Shingrix, and TDap   Additional Screening:  Vision Screening: Recommended annual ophthalmology exams for early detection of glaucoma and other disorders of the eye. Is the patient up to date with their annual eye exam?  Yes  Who is the provider or what is the name of the office in which the patient attends annual eye exams? Provider on [address], [place]   Dental Screening: Recommended annual dental exams for proper oral hygiene  Community Resource Referral / Chronic Care Management: CRR required this visit?  No   CCM required this visit?  No   Plan:    I have personally reviewed and noted the following in the patient's chart:   Medical and social history Use of alcohol, tobacco or illicit drugs  Current medications and supplements including opioid prescriptions. Patient is not currently taking opioid prescriptions. Functional ability and status Nutritional status Physical activity Advanced directives List of other physicians Hospitalizations, surgeries, and ER visits in previous 12 months Vitals Screenings to include cognitive, depression, and falls Referrals and appointments  In addition, I have reviewed and discussed with patient certain preventive protocols, quality metrics, and best practice recommendations. A written personalized care plan for preventive services as well as general preventive health recommendations were provided to patient.   Lavelle Pfeiffer [place], [place]   [id number]   After Visit Summary: (MyChart) Due to this being a telephonic visit, the after visit summary with patients personalized plan was offered to patient via MyChart   Notes: See telephone note

## 2023-12-14 NOTE — Patient Instructions (Addendum)
 [name],  Thank you for taking the time for your Medicare Wellness Visit. I appreciate your continued commitment to your health goals. Please review the care plan we discussed, and feel free to reach out if I can assist you further.  Medicare recommends these wellness visits once per year to help you and your care team stay ahead of potential health issues. These visits are designed to focus on prevention, allowing your provider to concentrate on managing your acute and chronic conditions during your regular appointments.  Please note that Annual Wellness Visits do not include a physical exam. Some assessments may be limited, especially if the visit was conducted virtually. If needed, we may recommend a separate in-person follow-up with your provider.  Ongoing Care Seeing your primary care provider every 3 to 6 months helps us  monitor your health and provide consistent, personalized care.   Referrals If a referral was made during today's visit and you haven't received any updates within two weeks, please contact the referred provider directly to check on the status.  Recommended Screenings:  Health Maintenance  Topic Date Due   DTaP/Tdap/Td vaccine (1 - Tdap) Never done   Zoster (Shingles) Vaccine (1 of 2) Never done   Flu Shot  10/06/2023   Medicare Annual Wellness Visit  12/13/2024   Colon Cancer Screening  07/02/2027   Pneumococcal Vaccine for age over 2  Completed   Meningitis B Vaccine  Aged Out   COVID-19 Vaccine  Discontinued   Hepatitis C Screening  Discontinued       12/14/2023   10:12 AM  Advanced Directives  Does Patient Have a Medical Advance Directive? Yes  Type of Advance Directive Living will  Does patient want to make changes to medical advance directive? Yes (MAU/Ambulatory/Procedural Areas - Information given)   Advance Care Planning is important because it: Ensures you receive medical care that aligns with your values, goals, and preferences. Provides  guidance to your family and loved ones, reducing the emotional burden of decision-making during critical moments.  Information on Advanced Care Planning can be found at   Secretary of [facility] Advance Health Care Directives Advance Health Care Directives ([email or web address]/)   Vision: Annual vision screenings are recommended for early detection of glaucoma, cataracts, and diabetic retinopathy. These exams can also reveal signs of chronic conditions such as diabetes and high blood pressure.  Dental: Annual dental screenings help detect early signs of oral cancer, gum disease, and other conditions linked to overall health, including heart disease and diabetes.  Please see the attached documents for additional preventive care recommendations.

## 2023-12-15 ENCOUNTER — Telehealth: Payer: Self-pay

## 2023-12-15 NOTE — Telephone Encounter (Signed)
 Patient seen for an AWV and states that he continues to have problems with insomnia and would like to try a different medication.  Says that he was on an older medication before that starts with an H but that was all the information that he could provide.    Also states that Belsomra  is too expensive

## 2023-12-20 ENCOUNTER — Other Ambulatory Visit: Payer: Self-pay

## 2023-12-20 DIAGNOSIS — F5101 Primary insomnia: Secondary | ICD-10-CM

## 2023-12-20 DIAGNOSIS — K5904 Chronic idiopathic constipation: Secondary | ICD-10-CM | POA: Diagnosis not present

## 2023-12-20 DIAGNOSIS — K648 Other hemorrhoids: Secondary | ICD-10-CM | POA: Diagnosis not present

## 2023-12-20 DIAGNOSIS — K581 Irritable bowel syndrome with constipation: Secondary | ICD-10-CM | POA: Diagnosis not present

## 2023-12-20 DIAGNOSIS — R1012 Left upper quadrant pain: Secondary | ICD-10-CM | POA: Diagnosis not present

## 2023-12-20 NOTE — Telephone Encounter (Signed)
 Unable to reach patient.

## 2023-12-21 ENCOUNTER — Other Ambulatory Visit: Payer: Self-pay | Admitting: Physician Assistant

## 2023-12-21 DIAGNOSIS — D751 Secondary polycythemia: Secondary | ICD-10-CM

## 2023-12-22 ENCOUNTER — Inpatient Hospital Stay: Attending: Hematology and Oncology

## 2023-12-22 ENCOUNTER — Other Ambulatory Visit: Payer: Self-pay

## 2023-12-22 ENCOUNTER — Inpatient Hospital Stay: Admitting: Physician Assistant

## 2023-12-22 VITALS — BP 138/79 | HR 56 | Temp 97.7°F | Resp 20 | Wt 222.4 lb

## 2023-12-22 DIAGNOSIS — Z7901 Long term (current) use of anticoagulants: Secondary | ICD-10-CM | POA: Diagnosis not present

## 2023-12-22 DIAGNOSIS — I4892 Unspecified atrial flutter: Secondary | ICD-10-CM | POA: Diagnosis not present

## 2023-12-22 DIAGNOSIS — Z8546 Personal history of malignant neoplasm of prostate: Secondary | ICD-10-CM | POA: Diagnosis not present

## 2023-12-22 DIAGNOSIS — Z79890 Hormone replacement therapy (postmenopausal): Secondary | ICD-10-CM | POA: Insufficient documentation

## 2023-12-22 DIAGNOSIS — F5101 Primary insomnia: Secondary | ICD-10-CM

## 2023-12-22 DIAGNOSIS — G4733 Obstructive sleep apnea (adult) (pediatric): Secondary | ICD-10-CM | POA: Diagnosis not present

## 2023-12-22 DIAGNOSIS — Z79899 Other long term (current) drug therapy: Secondary | ICD-10-CM | POA: Insufficient documentation

## 2023-12-22 DIAGNOSIS — D751 Secondary polycythemia: Secondary | ICD-10-CM

## 2023-12-22 LAB — CMP (CANCER CENTER ONLY)
ALT: 34 U/L (ref 0–44)
AST: 24 U/L (ref 15–41)
Albumin: 4.2 g/dL (ref 3.5–5.0)
Alkaline Phosphatase: 84 U/L (ref 38–126)
Anion gap: 5 (ref 5–15)
BUN: 18 mg/dL (ref 8–23)
CO2: 25 mmol/L (ref 22–32)
Calcium: 9.4 mg/dL (ref 8.9–10.3)
Chloride: 109 mmol/L (ref 98–111)
Creatinine: 1.01 mg/dL (ref 0.61–1.24)
GFR, Estimated: >60 mL/min (ref >60)
Glucose, Bld: 88 mg/dL (ref 70–99)
Potassium: 4.2 mmol/L (ref 3.5–5.1)
Sodium: 139 mmol/L (ref 135–145)
Total Bilirubin: 0.6 mg/dL (ref 0.0–1.2)
Total Protein: 6.9 g/dL (ref 6.5–8.1)

## 2023-12-22 LAB — CBC WITH DIFFERENTIAL (CANCER CENTER ONLY)
Abs Immature Granulocytes: 0.04 K/uL (ref 0.00–0.07)
Basophils Absolute: 0.1 K/uL (ref 0.0–0.1)
Basophils Relative: 1 %
Eosinophils Absolute: 0.3 K/uL (ref 0.0–0.5)
Eosinophils Relative: 5 %
HCT: 54.3 % — ABNORMAL HIGH (ref 39.0–52.0)
Hemoglobin: 17.9 g/dL — ABNORMAL HIGH (ref 13.0–17.0)
Immature Granulocytes: 1 %
Lymphocytes Relative: 25 %
Lymphs Abs: 1.7 K/uL (ref 0.7–4.0)
MCH: 27.8 pg (ref 26.0–34.0)
MCHC: 33 g/dL (ref 30.0–36.0)
MCV: 84.2 fL (ref 80.0–100.0)
Monocytes Absolute: 0.6 K/uL (ref 0.1–1.0)
Monocytes Relative: 9 %
Neutro Abs: 4.1 K/uL (ref 1.7–7.7)
Neutrophils Relative %: 59 %
Platelet Count: 188 K/uL (ref 150–400)
RBC: 6.45 MIL/uL — ABNORMAL HIGH (ref 4.22–5.81)
RDW: 15.5 % (ref 11.5–15.5)
WBC Count: 6.8 K/uL (ref 4.0–10.5)
nRBC: 0 % (ref 0.0–0.2)

## 2023-12-22 LAB — FERRITIN: Ferritin: 53 ng/mL (ref 24–336)

## 2023-12-22 NOTE — Progress Notes (Signed)
 [facility] [phone]   [phone]  PROGRESS NOTE  Patient Care Team: Milon Cleaves, [name] as PCP - General (Physician Assistant) Mealor, Eulas BRAVO, MD as PCP - Electrophysiology (Cardiology) Swaziland, Peter M, MD as PCP - Cardiology (Cardiology) Carolee Sherwood JONETTA DOUGLAS, MD as Consulting Physician (Urology) Goodrich, Callie E, PA-C as Physician Assistant (Cardiology) Malcolm Toribio DASEN, MD as Referring Physician (Neurology) Jeannetta Lonni ORN, MD as Consulting Physician (Rheumatology) Federico Norleen DASEN MADISON, MD as Consulting Physician (Hematology and Oncology) Louis Shove, MD as Consulting Physician (Neurosurgery)  Hematological/Oncological History # Polycythemia, secondary. Due to OSA/Exogenous Testosterone   06/11/2019: WBC 6.9, Hgb 15.5, MCV 91.4, Plt 219 04/01/2020: WBC 8.6, Hgb 18.4, MCV 87, Plt 219 08/03/2020: WBC 8.8, Hgb 19.3, MCV 88.7, Plt 187 09/09/2020: establish care with Dr. Federico. Hgb 18.2    Interval History:  Jacob Collier 68 y.o. male with medical history significant for secondary polycythemia who presents for a follow up visit. The patient's last visit was on 09/28/2023. In the interim since the last visit he underwent a ablation for atrial flutter.    On exam today Mr. Amer reports he discontinued pradaxa  due to dermatitis and increased fatigue. He continues on plavix  therapy. He is planning to work on weight loss to hopefully not require CPAP. He reports that he continues his testosterone  at lower than his usual dose.  Otherwise he has been his baseline level of health. He denies fevers, chills, sweats, shortness of breath, chest pain or cough. He has no questions concerns or complaints today.  MEDICAL HISTORY:  Past Medical History:  Diagnosis Date   Anxiety    Arthritis    Coronary artery disease    a. S/p DES to RCA in 01/2019 b. s/p CABG x4 (LIMA-LAD, reverse SVG-PDA, reverse SVG-OM1, reverse SVG-1st Diag) in 01/2021   Diverticulosis     Dyspnea    Hard of hearing    Hyperlipidemia    Hypertension    hx of elevation on lyrica , off med and now normal    Paroxysmal SVT (supraventricular tachycardia)    Post-op atrial fibrillation    Prostate cancer (HCC)    Wears glasses     SURGICAL HISTORY: Past Surgical History:  Procedure Laterality Date   A-FLUTTER ABLATION N/A 11/23/2023   Procedure: A-FLUTTER ABLATION;  Surgeon: Mealor, Eulas BRAVO, MD;  Location: [facility] INVASIVE CV LAB;  Service: Cardiovascular;  Laterality: N/A;   ANTERIOR CERVICAL DECOMP/DISCECTOMY FUSION N/A 09/03/2021   Procedure: CERVICAL FIVE-SIX, CERVICAL SIX-SEVEN ANTERIOR CERVICAL DECOMPRESSION/DISCECTOMY FUSION;  Surgeon: Louis Shove, MD;  Location: [facility] OR;  Service: Neurosurgery;  Laterality: N/A;   CARDIAC CATHETERIZATION     with 2 stents    CARDIOVERSION N/A 08/30/2023   Procedure: CARDIOVERSION;  Surgeon: Raford Riggs, MD;  Location: [facility] INVASIVE CV LAB;  Service: Cardiovascular;  Laterality: N/A;   COLECTOMY  05/2018   COLONOSCOPY     CORONARY ARTERY BYPASS GRAFT N/A 01/19/2021   Procedure: CORONARY ARTERY BYPASS GRAFTING (CABG), ON PUMP TIMES FOUR, USING LEFT INTERNAL MAMMARY ARTERY AND ENDOSOCPICALLY HARVESTED RIGHT GREATER SAPHENOUS VEIN;  Surgeon: Shyrl Linnie KIDD, MD;  Location: [facility] OR;  Service: Open Heart Surgery;  Laterality: N/A;   CYSTOSCOPY WITH URETHRAL DILATATION N/A 01/14/2021   Procedure: CYSTOSCOPY WITH BALLOON  DILATATION;  Surgeon: Gaston Hamilton, MD;  Location: [facility] ORS;  Service: Urology;  Laterality: N/A;   EYE SURGERY  02/2020   bilaterl cataracts   IR THORACENTESIS ASP PLEURAL SPACE W/IMG GUIDE  02/02/2021  LEFT HEART CATH AND CORONARY ANGIOGRAPHY N/A 12/24/2020   Procedure: LEFT HEART CATH AND CORONARY ANGIOGRAPHY;  Surgeon: Swaziland, Peter M, MD;  Location: [facility] INVASIVE CV LAB;  Service: Cardiovascular;  Laterality: N/A;   PROSTATECTOMY     SVT ABLATION N/A 10/20/2022   Procedure: SVT ABLATION;  Surgeon: Nancey Eulas BRAVO, MD;  Location: [facility];  Service: Cardiovascular;  Laterality: N/A;   TEE WITHOUT CARDIOVERSION N/A 01/19/2021   Procedure: TRANSESOPHAGEAL ECHOCARDIOGRAM (TEE);  Surgeon: Shyrl Linnie KIDD, MD;  Location: [facility] OR;  Service: Open Heart Surgery;  Laterality: N/A;   TOTAL HIP ARTHROPLASTY Left 11/10/2015   Procedure: LEFT TOTAL HIP ARTHROPLASTY ANTERIOR APPROACH;  Surgeon: Lonni CINDERELLA Poli, MD;  Location: [facility] OR;  Service: Orthopedics;  Laterality: Left;    SOCIAL HISTORY: Social History   Socioeconomic History   Marital status: Married    Spouse name: Not on file   Number of children: 0   Years of education: Not on file   Highest education level: Not on file  Occupational History   Occupation: Employed at [employer]  Tobacco Use   Smoking status: Never    Passive exposure: Never   Smokeless tobacco: Never  Vaping Use   Vaping status: Never Used  Substance and Sexual Activity   Alcohol use: Not Currently    Alcohol/week: 0.0 standard drinks of alcohol    Comment: rarely   Drug use: No   Sexual activity: Yes    Partners: Female    Birth control/protection: None  Other Topics Concern   Not on file  Social History Narrative   Not on file   Social Drivers of Health   Financial Resource Strain: Low Risk  (12/14/2023)   Overall Financial Resource Strain (CARDIA)    Difficulty of Paying Living Expenses: Not hard at all  Food Insecurity: No Food Insecurity (12/14/2023)   Hunger Vital Sign    Worried About Running Out of Food in the Last Year: Never true    Ran Out of Food in the Last Year: Never true  Transportation Needs: No Transportation Needs (12/14/2023)   PRAPARE - Administrator, Civil Service (Medical): No    Lack of Transportation (Non-Medical): No  Physical Activity: Insufficiently Active (12/14/2023)   Exercise Vital Sign    Days of Exercise per Week: 3 days    Minutes of Exercise per Session: 30 min  Stress: No Stress  Concern Present (12/14/2023)   [employer] of Occupational Health - Occupational Stress Questionnaire    Feeling of Stress: Not at all  Social Connections: Moderately Isolated (12/14/2023)   Social Connection and Isolation Panel    Frequency of Communication with Friends and Family: More than three times a week    Frequency of Social Gatherings with Friends and Family: More than three times a week    Attends Religious Services: Never    Database administrator or Organizations: No    Attends Banker Meetings: Never    Marital Status: Married  Catering manager Violence: Not At Risk (12/14/2023)   Humiliation, Afraid, Rape, and Kick questionnaire    Fear of Current or Ex-Partner: No    Emotionally Abused: No    Physically Abused: No    Sexually Abused: No    FAMILY HISTORY: Family History  Problem Relation Age of Onset   Alzheimer's disease Mother    Stroke Father    Diabetes Sister    Colon polyps Brother  Heart attack Maternal Grandmother    Cancer Maternal Uncle        prostate   Prostate cancer Maternal Uncle    Cancer Maternal Uncle        prostate   Prostate cancer Maternal Uncle    Colon cancer Neg Hx    Esophageal cancer Neg Hx    Rectal cancer Neg Hx    Stomach cancer Neg Hx     ALLERGIES:  is allergic to ranexa [ranolazine], amoxil  [amoxicillin ], lipitor  [atorvastatin ], pravachol  [pravastatin ], rosuvastatin , statins, eliquis  [apixaban ], and xarelto  [rivaroxaban ].  MEDICATIONS:  Current Outpatient Medications  Medication Sig Dispense Refill   amLODipine  (NORVASC ) 5 MG tablet Take 1 tablet (5mg ) in the morning and 1/2 tablet (2.5mg ) in the evening. (Patient taking differently: Take 5 mg by mouth See admin instructions. Take 5 mg in the morning and 2.5 mg in the evening.)     aspirin  EC 81 MG tablet Take 81 mg by mouth daily. Swallow whole.     clonazePAM  (KLONOPIN ) 1 MG tablet TAKE 1 TABLET BY MOUTH TWICE A DAY AS NEEDED FOR ANXIETY 60 tablet 3    clopidogrel  (PLAVIX ) 75 MG tablet Take 75 mg by mouth daily.     Evolocumab  (REPATHA  SURECLICK) 140 MG/ML SOAJ INJECT 140 MG INTO THE SKIN EVERY 14 (FOURTEEN) DAYS. 6 mL 3   ezetimibe  (ZETIA ) 10 MG tablet TAKE 1 TABLET BY MOUTH EVERY DAY 90 tablet 1   hydrocortisone -pramoxine (PROCTOFOAM-HC) rectal foam Place 1 applicator rectally 2 (two) times daily. (Patient taking differently: Place 1 applicator rectally 2 (two) times daily as needed for hemorrhoids.) 10 g 3   lubiprostone  (AMITIZA ) 24 MCG capsule Take 1 capsule (24 mcg total) by mouth 2 (two) times daily with a meal. (Patient taking differently: Take 24 mcg by mouth 2 (two) times daily as needed for constipation.) 60 capsule 2   metoprolol  succinate (TOPROL -XL) 25 MG 24 hr tablet Take 0.5 tablets (12.5 mg total) by mouth at bedtime. 45 tablet 2   PRESCRIPTION MEDICATION 1 application  daily as needed (ED). [facility] - [place], [place] - [address]  TRIMIX PAPAVERINE/PHENTOLAMINE/ALPROSTADIL 30MG /1MG /10MCG/ML     dabigatran  (PRADAXA ) 150 MG CAPS capsule Take 150 mg by mouth 2 (two) times daily. (Patient not taking: Reported on 12/22/2023)     zolpidem  (AMBIEN  CR) 6.25 MG CR tablet Take 1 tablet (6.25 mg total) by mouth at bedtime as needed for sleep. Take 1-2 tablets by mouth at night for bedtime. (Patient not taking: Reported on 12/22/2023) 30 tablet 0   No current facility-administered medications for this visit.    REVIEW OF SYSTEMS:   Constitutional: ( - ) fevers, ( - )  chills , ( - ) night sweats Eyes: ( - ) blurriness of vision, ( - ) double vision, ( - ) watery eyes Ears, nose, mouth, throat, and face: ( - ) mucositis, ( - ) sore throat Respiratory: ( - ) cough, ( - ) dyspnea, ( - ) wheezes Cardiovascular: ( - ) palpitation, ( - ) chest discomfort, ( - ) lower extremity swelling Gastrointestinal:  ( - ) nausea, ( - ) heartburn, ( - ) change in bowel habits Skin: ( - ) abnormal skin rashes Lymphatics: ( - ) new  lymphadenopathy, ( - ) easy bruising Neurological: ( - ) numbness, ( - ) tingling, ( - ) new weaknesses Behavioral/Psych: ( - ) mood change, ( - ) new changes  All other systems were reviewed with the patient and  are negative.  PHYSICAL EXAMINATION:  Vitals:   12/22/23 1025  BP: 138/79  Pulse: (!) 56  Resp: 20  Temp: 97.7 F (36.5 C)  SpO2: 96%      Filed Weights   12/22/23 1025  Weight: 222 lb 6.4 oz (100.9 kg)       GENERAL: Well-appearing elderly Caucasian male, alert, no distress and comfortable SKIN: skin color, texture, turgor are normal, no rashes or significant lesions EYES: conjunctiva are pink and non-injected, sclera clear LUNGS: clear to auscultation and percussion with normal breathing effort HEART: regular rate & rhythm and no murmurs and no lower extremity edema Musculoskeletal: no cyanosis of digits and no clubbing  PSYCH: alert & oriented x 3, fluent speech NEURO: no focal motor/sensory deficits  LABORATORY DATA:  I have reviewed the data as listed    Latest Ref Rng & Units 12/22/2023    9:56 AM 11/21/2023   10:19 AM 10/24/2023    2:32 PM  CBC  WBC 4.0 - 10.5 K/uL 6.8  8.7  9.0   Hemoglobin 13.0 - 17.0 g/dL 82.0  82.0  82.5   Hematocrit 39.0 - 52.0 % 54.3  56.9  55.6   Platelets 150 - 400 K/uL 188  227  211        Latest Ref Rng & Units 12/22/2023    9:56 AM 11/21/2023   10:19 AM 10/24/2023    2:32 PM  CMP  Glucose 70 - 99 mg/dL 88  96  97   BUN 8 - 23 mg/dL 18  13  16    Creatinine 0.61 - 1.24 mg/dL 8.98  8.81  8.96   Sodium 135 - 145 mmol/L 139  139  138   Potassium 3.5 - 5.1 mmol/L 4.2  4.3  4.8   Chloride 98 - 111 mmol/L 109  103  104   CO2 22 - 32 mmol/L 25  22  20    Calcium  8.9 - 10.3 mg/dL 9.4  9.2  9.4   Total Protein 6.5 - 8.1 g/dL 6.9   6.7   Total Bilirubin 0.0 - 1.2 mg/dL 0.6   0.6   Alkaline Phos 38 - 126 U/L 84   90   AST 15 - 41 U/L 24   33   ALT 0 - 44 U/L 34   51     RADIOGRAPHIC STUDIES: EP STUDY Result Date:  11/24/2023  SURGEON:  Eulas FORBES Furbish, MD    PREPROCEDURE DIAGNOSIS:  Atrial flutter.    POSTPROCEDURE DIAGNOSIS:  Atrial flutter    PROCEDURES: EP study Coronary sinus pacing and recording Radiofrequency ablation of supraventricular tachycardia -- CTI for atrial flutter    INTRODUCTION: Jacob Collier is a 68 y.o. male with a history of typical appearing atrial flutter who presents today for EP study and radiofrequency ablation.  The patient recently developed symptoms of weakness and fatigue for which he was found to have atrial flutter with elevated ventricular rates.   The patient remains symptomatic despite rate control.  The patient has been adequately anticoagulated for three weeks and now presents for EP study and radiofrequency ablation of atrial flutter.   DESCRIPTION OF PROCEDURE:  Informed written consent was obtained and the patient was brought to the Electrophysiology Lab in the fasting state. The patient was adequately sedated with intravenous medication as outlined in the anesthesia report.  The patient's bilateral groins were prepped and draped in the usual sterile fashion by the EP Lab staff.  Using ultrasound guidance and  modified Seldinger technique, two 8-french and one 9-French sheath were placed in the right groin. An 8-French intracardiac echo catheter was advanced through the 9-French sheath to the right atrium. Baseline intracardiac echo exam revealed trivial pericardial effusion and normal left ventricular function. The CTI was suitable for ablation. A 7-French Biosense Webster decapolar catheter was introduced through the right common femoral vein and advanced into the coronary sinus for recording and pacing from this location.  A Biosense Webster 8mm non-irrigated ablation catheter was then advanced to the right atrium. A limited electroanatomic map was created of the relevant structures and the His annotated. This catheter was then placed at the basal RV where it was used for  recording and pacing. It was then pulled back to the His bundle for recording and pacing.  Presenting Measurements: The patient presented to the Electrophysiology Lab in Sinus rhythm.  The RR interval was 942 ms, PR interval 237 ms, QRS duration 83 ms, QT interval 445 ms. Arrhythmia induction was attempted with burst pacing and programmed stimulation without success.  Because the surface EKG during the arrhythmia previously was consistent with a typical CTI dependent atrial flutter, I decided to proceed with ablation of the CTI. Ablation: The ablation catheter was positioned along the cavotricuspid isthmus.  Prior to ablation CTI transit time measured 86 ms.  A series of radiofrequency applications were in a line extending from the tricuspid valve to the inferior vena cava, delivered with up to 60 Watts.   Additional radiofrequency lesions were delivered to consolidate the ablated line along the isthmus.  Bidirectional block was demonstrated across the CTI by pacing and recording from the proximal CS electrodes and the ablation catheter positioned lateral to the ablated line. Differential pacing also confirmed block across the CTI. The stimulus to earliest activation across the isthmus bidirectionally measured > 180 msec.  The patient was observed for 20 minutes without return of conduction through the cavotricuspid isthmus.  Measurements following ablation: Following ablation, atrial pacing was performed, which revealed decremental AV conduction with no evidence of PR greater than RR.  The AV Wenckebach cycle length was 540 milliseconds.  Atrial pacing was continued down to a cycle length of 200 milliseconds with no arrhythmias induced.  No arrhythmias were induced. The procedure was therefore considered completed.  All catheters were removed and the sheaths were aspirated and flushed.  The sheaths were removed and hemostasis achieved with manual pressure. EBL<27ml.  Following ablation, the RR interval was 182 ms,  the PR interval to 925 ms, QRS duration 97 ms, QT interval 450 ms. The patient tolerated the procedure well without immediate complication.    CONCLUSIONS:  1. Isthmus-dependent counter clockwise right atrial flutter.  2. Successful radiofrequency ablation of atrial flutter along the cavotricuspid isthmus with complete bidirectional isthmus block achieved.  3. No inducible arrhythmias following ablation.  4. No early apparent complications.   Eulas FORBES Furbish, MD 11/23/2023 12:48 PM     ASSESSMENT & PLAN Jacob Collier is a 68 y.o.  male with medical history significant for secondary polycythemia who presents for a follow up visit.   # Polycythemia, Secondary to OSA and Exogenous Testosterone  -- Findings at this time are most consistent with a secondary polycythemia due to either obstructive sleep apnea or exogenous testosterone  injections. --negative JAK2 with reflex and BCR/ABL FISH panels at last visit.  --We discussed how it is controversial to perform phlebotomies outside the setting of polycythemia vera.  The patient reports that he feels much better and  would be willing to talk with his sleep medicine doctor, in the interim we will perform phlebotomies as a temporizing measure to see if this improves some of his symptoms. PLAN: --Labs today show white blood cell 6.8, hemoglobin 17.9, hematocrit 54.3%, platelets 188 --Hgb above target at 54.3%, recommend phlebotomy. Will arrange in 4 weeks as patient is recovering from cardiac ablation.  -- Patient has a diagnosis of OSA and has not started the CPAP machine.  He is trying to lose weight to improve his symptoms.   #Prostate Cancer --s/p prostatectomy in 2015, biochemical recurrence treated in 2018 with radiation therapy --currently follows with [facility] -- Discussed with patient that testosterone  shots in the setting of history of prostate cancer is not advisable. -- Defer management to urology, but patient wanted us  to continue to  monitoring for this disease would be happy to oblige.  Follow up: --4 weeks: labs and phlebotomy --8 weeks: labs only --12 weeks: labs and follow up.   No orders of the defined types were placed in this encounter.   All questions were answered. The patient knows to call the clinic with any problems, questions or concerns.   I have spent a total of 25 minutes minutes of face-to-face and non-face-to-face time, preparing to see the patient, performing a medically appropriate examination, counseling and educating the patient, documenting clinical information in the electronic health record, independently interpreting results and communicating results to the patient, and care coordination.   Johnston Police PA-C Dept of Hematology and Oncology [facility] Phone: [phone]   12/22/2023 10:39 AM

## 2023-12-22 NOTE — Telephone Encounter (Unsigned)
 Copied from CRM [id number]. Topic: Clinical - Medication Refill >> Dec 22, 2023  5:15 PM Shanda MATSU wrote: Medication: zolpidem  (AMBIEN  CR) 6.25 MG CR tablet  Has the patient contacted their pharmacy? Yes, referred to provider due to no refills on file.  (Agent: If no, request that the patient contact the pharmacy for the refill. If patient does not wish to contact the pharmacy document the reason why and proceed with request.) (Agent: If yes, when and what did the pharmacy advise?)  This is the patient's preferred pharmacy:  [id number] - [place], [place] - [address] AT [facility] [address] [place] [address] Phone: [phone] Fax: [phone]  Is this the correct pharmacy for this prescription? Yes If no, delete pharmacy and type the correct one.   Has the prescription been filled recently? No  Is the patient out of the medication? Yes  Has the patient been seen for an appointment in the last year OR does the patient have an upcoming appointment? Yes  Can we respond through MyChart? Yes  Agent: Please be advised that Rx refills may take up to 3 business days. We ask that you follow-up with your pharmacy.

## 2023-12-25 ENCOUNTER — Telehealth: Payer: Self-pay | Admitting: Physician Assistant

## 2023-12-25 ENCOUNTER — Telehealth: Payer: Self-pay

## 2023-12-25 MED ORDER — ZOLPIDEM TARTRATE ER 6.25 MG PO TBCR: 6.2500 mg | EXTENDED_RELEASE_TABLET | Freq: Every evening | ORAL | 0 refills | Status: DC | PRN

## 2023-12-25 NOTE — Telephone Encounter (Unsigned)
 Copied from CRM #[id number]. Topic: Clinical - Prescription Issue >> Dec 25, 2023  5:06 PM Sophia H wrote: Reason for CRM: Patient states his RX for zolpidem  (AMBIEN  CR) 6.25 MG CR tablet was denied at the pharmacy. Looks like written with 0 refills, not sure if that is why? Please advise/resend

## 2023-12-25 NOTE — Telephone Encounter (Signed)
 Copied from CRM #[id number]. Topic: Clinical - Medication Question >> Dec 25, 2023 11:00 AM Fonda T wrote: Reason for CRM: Patient calling to check status of refill request on medication, zolpidem  (AMBIEN  CR) 6.25 MG CR tablet.   Patient aware of 3 day processing time, however patient states he is out of medication and would like it to be refilled as soon as possible, as he is not sleeping, and needs medication refilled as soon as possible.    Patient can be reached for follow up call at [phone].   [id number] [place], [place] - [address] AT [facility] [address] [place] [address] Phone: [phone] Fax: [phone]

## 2023-12-27 ENCOUNTER — Ambulatory Visit: Attending: Cardiology | Admitting: Cardiovascular Disease

## 2023-12-27 ENCOUNTER — Encounter: Payer: Self-pay | Admitting: Cardiovascular Disease

## 2023-12-27 VITALS — BP 156/80 | HR 72 | Ht 72.0 in | Wt 219.5 lb

## 2023-12-27 DIAGNOSIS — I471 Supraventricular tachycardia, unspecified: Secondary | ICD-10-CM | POA: Diagnosis not present

## 2023-12-27 NOTE — Telephone Encounter (Signed)
 Spoke with pharmacy, they stated patient has picked up the medication yesterday 10.21.

## 2023-12-27 NOTE — Patient Instructions (Signed)
 Medication Instructions:  Your physician recommends that you continue on your current medications as directed. Please refer to the Current Medication list given to you today.  *If you need a refill on your cardiac medications before your next appointment, please call your pharmacy*  Lab Work: None ordered.  If you have labs (blood work) drawn today and your tests are completely normal, you will receive your results only by: MyChart Message (if you have MyChart) OR A paper copy in the mail If you have any lab test that is abnormal or we need to change your treatment, we will call you to review the results.  Testing/Procedures: None ordered.   Follow-Up: At [facility], you and your health needs are our priority.  As part of our continuing mission to provide you with exceptional heart care, our providers are all part of one team.  This team includes your primary Cardiologist (physician) and Advanced Practice Providers or APPs (Physician Assistants and Nurse Practitioners) who all work together to provide you with the care you need, when you need it.  Your next appointment:   12 months with Dr Mealor

## 2023-12-27 NOTE — Progress Notes (Signed)
 Electrophysiology Office Note:    Date:  12/27/2023   ID:  Jacob Collier, DOB 1955/03/19, MRN [id number]  PCP:  Milon Cleaves, PA   [facility] Providers Cardiologist:  Peter Swaziland, MD Cardiology APP:  Jadine Aline BRAVO, PA-C  Electrophysiologist:  Eulas BRAVO Furbish, MD     Referring MD: Milon Cleaves, [name]   History of Present Illness:    Jacob Collier is a 68 y.o. male with a hx listed below, significant for paroxysmal SVT, referred for arrhythmia management.  He recently wore a monitor that detected occasional episodes of tachycardia. The longest lasted almost 15 minutes.  Atrial activity is difficult to discern due to artifact.  He has had rapid and irregular rhythms for years, predating his CABG.  Seems like they have gotten worse recently.  He is particular bothered by rapid rates which last for a few minutes.  He uses a pulse ox and it frequently shows rates of about 145 bpm.  These occur with rapid onset but oftentimes slow down gradually.  He also notices occasional irregular rhythms.  He has not had syncope.  He has had edema with amlodipine .  He does not tolerate higher doses of beta-blocker very well.  He underwent successful ablation of AVNRT, but it was complicated by DVT after the procedure and he was placed on Eliquis .  He was referred to the ER from his primary care doctor on June 25 with atrial flutter.  He had minimal symptoms with a flutter and underwent cardioversion later that month.  He has not been tolerating his Pradaxa  very well and has had GI upset and back pain.  The symptoms resolved when he held his Pradaxa  temporarily  He underwent ablation of atrial flutter on November 24, 2023.  He felt much better after the ablation and has been maintaining sinus rhythm.  He monitors with an Apple watch.  He had 1 episode of irregular palpitations.  EKG at the time from his watch showed sinus rhythm with PACs.  This occurred after he had resumed Linzess ,  which is caused rhythm issues for him in the past.  He has no office medication and not having any symptoms of arrhythmia  EKGs/Labs/Other Studies Reviewed Today:    Echocardiogram:  TEE 02/01/21 EF 50% with mildly hypokinetic inferior wall    Monitors:  ZioXT 11 days 04/2022 Sinus rhythm HR 47-99 bpm, avg 69. 2% PACs  Multiple episodes of narrow complex tachycardia One strip (2/10 10:11:03) appears to show a short R-P  EKG:   EKG Interpretation Date/Time:  Wednesday December 27 2023 13:49:24 EDT Ventricular Rate:  72 PR Interval:  190 QRS Duration:  102 QT Interval:  440 QTC Calculation: 481 R Axis:   73  Text Interpretation: Normal sinus rhythm Nonspecific ST abnormality Prolonged QT When compared with ECG of 23-Nov-2023 13:01, No significant change was found Confirmed by Furbish Eulas [phone]) on 12/27/2023 2:12:47 PM     Recent Labs: 12/22/2023: ALT 34; BUN 18; Creatinine 1.01; Hemoglobin 17.9; Platelet Count 188; Potassium 4.2; Sodium 139     Physical Exam:    VS:  BP (!) 156/80   Pulse 72   Ht 6' (1.829 m)   Wt 219 lb 8 oz (99.6 kg)   SpO2 97%   BMI 29.77 kg/m     Wt Readings from Last 3 Encounters:  12/27/23 219 lb 8 oz (99.6 kg)  12/22/23 222 lb 6.4 oz (100.9 kg)  12/14/23 215 lb (97.5 kg)  GEN: Well nourished, well developed in no acute distress CARDIAC: RRR, no murmurs, rubs, gallops RESPIRATORY:  Normal work of breathing MUSCULOSKELETAL: no edema    ASSESSMENT & PLAN:    Atrial flutter Documented on EKG August 22, 2023 Status post ablation September 2025 He does not well tolerate any anticoagulation Monitoring with an Apple watch and has not had recurrence of atrial arrhythmias other than a few PACs We will discontinue Pradaxa  and continue to watch --we discussed the relative risks and benefits of anticoagulation and he has a strong preference to come off the blood thinners   AVNRT AVNRT induced during EP study and the slow pathway was  modified He has not had any symptoms to suggest recurrence of AVNRT after the ablation He has had frequent PACs in the past  Pulmonary embolus I suspect this is due to venous vascular access and stasis after his ablation, would consider this a provoked PE   CAD s/p DES and CABG  On ASA and plavix .  Currently on Xarelto  and aspirin  (Plavix  held) due to PE Continue metoprolol             Medication Adjustments/Labs and Tests Ordered: Current medicines are reviewed at length with the patient today.  Concerns regarding medicines are outlined above.  Orders Placed This Encounter  Procedures   EKG 12-Lead   No orders of the defined types were placed in this encounter.    Signed, Eulas FORBES Furbish, MD  12/27/2023 2:22 PM    [facility]

## 2024-01-02 ENCOUNTER — Encounter: Payer: Self-pay | Admitting: Physician Assistant

## 2024-01-02 ENCOUNTER — Ambulatory Visit (INDEPENDENT_AMBULATORY_CARE_PROVIDER_SITE_OTHER): Admitting: Physician Assistant

## 2024-01-02 VITALS — BP 152/82 | HR 67 | Temp 97.9°F | Ht 72.0 in | Wt 213.8 lb

## 2024-01-02 DIAGNOSIS — K5904 Chronic idiopathic constipation: Secondary | ICD-10-CM

## 2024-01-02 DIAGNOSIS — I480 Paroxysmal atrial fibrillation: Secondary | ICD-10-CM | POA: Diagnosis not present

## 2024-01-02 DIAGNOSIS — I25110 Atherosclerotic heart disease of native coronary artery with unstable angina pectoris: Secondary | ICD-10-CM

## 2024-01-02 DIAGNOSIS — F419 Anxiety disorder, unspecified: Secondary | ICD-10-CM

## 2024-01-02 DIAGNOSIS — F5101 Primary insomnia: Secondary | ICD-10-CM

## 2024-01-02 MED ORDER — QUVIVIQ 50 MG PO TABS: 50.0000 mg | ORAL_TABLET | Freq: Every evening | ORAL | 3 refills | Status: DC

## 2024-01-02 MED ORDER — BISACODYL 10 MG RE SUPP: 10.0000 mg | RECTAL | 0 refills | Status: AC | PRN

## 2024-01-02 NOTE — Progress Notes (Signed)
 Acute Office Visit  Subjective:    Patient ID: Jacob Collier, male    DOB: April 01, 1955, 68 y.o.   MRN: [id number]  Chief Complaint  Patient presents with   Angina     HPI: Patient is in today for chest pain and palpitations  Discussed the use of AI scribe software for clinical note transcription with the patient, who gave verbal consent to proceed.  History of Present Illness Jacob Collier is a 68 year old male with atrial fibrillation and atrial flutter who presents for follow-up regarding chest pain and arrhythmia symptoms.  He experiences chest pain and a sensation of arrhythmia, describing it as an 'arrhythmia feeling again.' He uses a Cardiac device a friend has, which showed possible atrial fibrillation in four or five readings over the weekend. He previously saw his cardiologist who identified the issue as premature atrial contractions (PACs). He is concerned about the possibility of atrial fibrillation and its implications.  He has a history of atrial flutter and atrial fibrillation, both treated with ablation. The atrial flutter was ablated by a specialist, and he has been informed that it cannot return. However, he experiences PACs more regularly, which he associates with taking Linzess , a medication prescribed for gastrointestinal issues. He believes Linzess  affects his electrolytes, exacerbating his symptoms.  He experiences gastrointestinal issues, including discomfort and constipation. He was prescribed Linzess  at the lowest dose by a GI specialist, but after four days of use, he experienced adverse effects and discontinued it. He was scheduled for a barium enema but postponed it due to concerns about his heart condition. He uses over-the-counter enemas and suppositories to manage his symptoms.  He reports significant sleep disturbances, stating he has not slept more than four to five hours in the past few days. He has a history of using Ambien  and Lunesta , which are  no longer effective. He uses Klonopin  which has been successful before, but is concerned about becoming dependant on the medicine. He also mentions a fear of not waking up, which contributes to his insomnia.  He has a history of using Pradaxa , which he associates with skin reactions, and has since switched back to Plavix . His heart rate has been stable, and he has lost weight.      Past Medical History:  Diagnosis Date   Anxiety    Arthritis    Coronary artery disease    a. S/p DES to RCA in 01/2019 b. s/p CABG x4 (LIMA-LAD, reverse SVG-PDA, reverse SVG-OM1, reverse SVG-1st Diag) in 01/2021   Diverticulosis    Dyspnea    Hard of hearing    Hyperlipidemia    Hypertension    hx of elevation on lyrica , off med and now normal    Paroxysmal SVT (supraventricular tachycardia)    Post-op atrial fibrillation    Prostate cancer (HCC)    Wears glasses     Past Surgical History:  Procedure Laterality Date   A-FLUTTER ABLATION N/A 11/23/2023   Procedure: A-FLUTTER ABLATION;  Surgeon: Nancey Eulas BRAVO, MD;  Location: [facility] INVASIVE CV LAB;  Service: Cardiovascular;  Laterality: N/A;   ANTERIOR CERVICAL DECOMP/DISCECTOMY FUSION N/A 09/03/2021   Procedure: CERVICAL FIVE-SIX, CERVICAL SIX-SEVEN ANTERIOR CERVICAL DECOMPRESSION/DISCECTOMY FUSION;  Surgeon: Louis Shove, MD;  Location: [facility] OR;  Service: Neurosurgery;  Laterality: N/A;   CARDIAC CATHETERIZATION     with 2 stents    CARDIOVERSION N/A 08/30/2023   Procedure: CARDIOVERSION;  Surgeon: Raford Riggs, MD;  Location: [facility] INVASIVE CV LAB;  Service:  Cardiovascular;  Laterality: N/A;   COLECTOMY  05/2018   COLONOSCOPY     CORONARY ARTERY BYPASS GRAFT N/A 01/19/2021   Procedure: CORONARY ARTERY BYPASS GRAFTING (CABG), ON PUMP TIMES FOUR, USING LEFT INTERNAL MAMMARY ARTERY AND ENDOSOCPICALLY HARVESTED RIGHT GREATER SAPHENOUS VEIN;  Surgeon: Shyrl Linnie KIDD, MD;  Location: [facility] OR;  Service: Open Heart Surgery;  Laterality: N/A;    CYSTOSCOPY WITH URETHRAL DILATATION N/A 01/14/2021   Procedure: CYSTOSCOPY WITH BALLOON  DILATATION;  Surgeon: Gaston Hamilton, MD;  Location: [facility] ORS;  Service: Urology;  Laterality: N/A;   EYE SURGERY  02/2020   bilaterl cataracts   IR THORACENTESIS ASP PLEURAL SPACE W/IMG GUIDE  02/02/2021   LEFT HEART CATH AND CORONARY ANGIOGRAPHY N/A 12/24/2020   Procedure: LEFT HEART CATH AND CORONARY ANGIOGRAPHY;  Surgeon: Jordan, Peter M, MD;  Location: [facility] INVASIVE CV LAB;  Service: Cardiovascular;  Laterality: N/A;   PROSTATECTOMY     SVT ABLATION N/A 10/20/2022   Procedure: SVT ABLATION;  Surgeon: Nancey Eulas BRAVO, MD;  Location: [facility]AS[facility] CV [facility];  Service: Cardiovascular;  Laterality: N/A;   TEE WITHOUT CARDIOVERSION N/A 01/19/2021   Procedure: TRANSESOPHAGEAL ECHOCARDIOGRAM (TEE);  Surgeon: Shyrl Linnie KIDD, MD;  Location: [facility] OR;  Service: Open Heart Surgery;  Laterality: N/A;   TOTAL HIP ARTHROPLASTY Left 11/10/2015   Procedure: LEFT TOTAL HIP ARTHROPLASTY ANTERIOR APPROACH;  Surgeon: Lonni CINDERELLA Poli, MD;  Location: [facility] OR;  Service: Orthopedics;  Laterality: Left;    Family History  Problem Relation Age of Onset   Alzheimer's disease Mother    Stroke Father    Diabetes Sister    Colon polyps Brother    Heart attack Maternal Grandmother    Cancer Maternal Uncle        prostate   Prostate cancer Maternal Uncle    Cancer Maternal Uncle        prostate   Prostate cancer Maternal Uncle    Colon cancer Neg Hx    Esophageal cancer Neg Hx    Rectal cancer Neg Hx    Stomach cancer Neg Hx     Social History   Socioeconomic History   Marital status: Married    Spouse name: Not on file   Number of children: 0   Years of education: Not on file   Highest education level: Not on file  Occupational History   Occupation: Employed at [employer]  Tobacco Use   Smoking status: Never    Passive exposure: Never   Smokeless tobacco: Never  Vaping Use   Vaping status: Never  Used  Substance and Sexual Activity   Alcohol use: Not Currently    Alcohol/week: 0.0 standard drinks of alcohol    Comment: rarely   Drug use: No   Sexual activity: Yes    Partners: Female    Birth control/protection: None  Other Topics Concern   Not on file  Social History Narrative   Not on file   Social Drivers of Health   Financial Resource Strain: Low Risk  (12/14/2023)   Overall Financial Resource Strain (CARDIA)    Difficulty of Paying Living Expenses: Not hard at all  Food Insecurity: No Food Insecurity (12/14/2023)   Hunger Vital Sign    Worried About Running Out of Food in the Last Year: Never true    Ran Out of Food in the Last Year: Never true  Transportation Needs: No Transportation Needs (12/14/2023)   PRAPARE - Administrator, Civil Service (Medical): No  Lack of Transportation (Non-Medical): No  Physical Activity: Insufficiently Active (12/14/2023)   Exercise Vital Sign    Days of Exercise per Week: 3 days    Minutes of Exercise per Session: 30 min  Stress: No Stress Concern Present (12/14/2023)   [employer] of Occupational Health - Occupational Stress Questionnaire    Feeling of Stress: Not at all  Social Connections: Moderately Isolated (12/14/2023)   Social Connection and Isolation Panel    Frequency of Communication with Friends and Family: More than three times a week    Frequency of Social Gatherings with Friends and Family: More than three times a week    Attends Religious Services: Never    Database Administrator or Organizations: No    Attends Banker Meetings: Never    Marital Status: Married  Catering Manager Violence: Not At Risk (12/14/2023)   Humiliation, Afraid, Rape, and Kick questionnaire    Fear of Current or Ex-Partner: No    Emotionally Abused: No    Physically Abused: No    Sexually Abused: No    Outpatient Medications Prior to Visit  Medication Sig Dispense Refill   amLODipine  (NORVASC ) 5 MG tablet  Take 1 tablet (5mg ) in the morning and 1/2 tablet (2.5mg ) in the evening. (Patient taking differently: Take 5 mg by mouth See admin instructions. Take 5 mg in the morning and 2.5 mg in the evening.)     aspirin  EC 81 MG tablet Take 81 mg by mouth daily. Swallow whole.     clonazePAM  (KLONOPIN ) 1 MG tablet TAKE 1 TABLET BY MOUTH TWICE A DAY AS NEEDED FOR ANXIETY 60 tablet 3   clopidogrel  (PLAVIX ) 75 MG tablet Take 75 mg by mouth daily.     Evolocumab  (REPATHA  SURECLICK) 140 MG/ML SOAJ INJECT 140 MG INTO THE SKIN EVERY 14 (FOURTEEN) DAYS. 6 mL 3   ezetimibe  (ZETIA ) 10 MG tablet TAKE 1 TABLET BY MOUTH EVERY DAY 90 tablet 1   hydrocortisone -pramoxine (PROCTOFOAM-HC) rectal foam Place 1 applicator rectally 2 (two) times daily. (Patient taking differently: Place 1 applicator rectally 2 (two) times daily as needed for hemorrhoids.) 10 g 3   metoprolol  succinate (TOPROL -XL) 25 MG 24 hr tablet Take 0.5 tablets (12.5 mg total) by mouth at bedtime. 45 tablet 2   PRESCRIPTION MEDICATION 1 application  daily as needed (ED). [facility] - [place], [place] - [address]  TRIMIX PAPAVERINE/PHENTOLAMINE/ALPROSTADIL 30MG /1MG /10MCG/ML     zolpidem  (AMBIEN  CR) 6.25 MG CR tablet Take 1 tablet (6.25 mg total) by mouth at bedtime as needed for sleep. Take 1-2 tablets by mouth at night for bedtime. 30 tablet 0   dabigatran  (PRADAXA ) 150 MG CAPS capsule Take 150 mg by mouth 2 (two) times daily.     lubiprostone  (AMITIZA ) 24 MCG capsule Take 1 capsule (24 mcg total) by mouth 2 (two) times daily with a meal. (Patient not taking: Reported on 12/27/2023) 60 capsule 2   No facility-administered medications prior to visit.    Allergies  Allergen Reactions   Ranexa [Ranolazine] Other (See Comments)    Chest discomfort Acid reflux   Amoxil  [Amoxicillin ] Hives   Lipitor  [Atorvastatin ] Other (See Comments)    Myalgias   Pravachol  [Pravastatin ] Other (See Comments)    Myalgias    Rosuvastatin  Other (See  Comments)    Myalgias    Statins Other (See Comments)    Myalgias    Eliquis  Eulalia.ferns ] Rash and Other (See Comments)    Arthralgias Tachycardia  Xarelto  [Rivaroxaban ] Rash and Other (See Comments)    Arthralgias Tachycardia     Review of Systems  Constitutional:  Negative for appetite change, fatigue and fever.  HENT:  Negative for congestion, ear pain, sinus pressure and sore throat.   Respiratory:  Negative for cough, chest tightness, shortness of breath and wheezing.   Cardiovascular:  Positive for chest pain and palpitations.  Gastrointestinal:  Negative for abdominal pain, constipation, diarrhea, nausea and vomiting.  Genitourinary:  Negative for dysuria and hematuria.  Musculoskeletal:  Negative for arthralgias, back pain, joint swelling and myalgias.  Skin:  Negative for rash.  Neurological:  Negative for dizziness, weakness and headaches.  Psychiatric/Behavioral:  Negative for dysphoric mood. The patient is not nervous/anxious.        Objective:        01/02/2024    3:43 PM 12/27/2023    1:53 PM 12/22/2023   10:25 AM  Vitals with BMI  Height 6' 0 6' 0   Weight 213 lbs 13 oz 219 lbs 8 oz 222 lbs 6 oz  BMI 28.99 29.76 30.16  Systolic 152 156 861  Diastolic 82 80 79  Pulse 67 72 56    Orthostatic VS for the past 72 hrs (Last 3 readings):  Patient Position BP Location  01/02/24 1543 Sitting Left Arm     Physical Exam Vitals reviewed.  Constitutional:      Appearance: Normal appearance.  Cardiovascular:     Rate and Rhythm: Normal rate and regular rhythm.     Heart sounds: Normal heart sounds.  Pulmonary:     Effort: Pulmonary effort is normal.     Breath sounds: Normal breath sounds.  Abdominal:     General: Bowel sounds are normal.     Palpations: Abdomen is soft.     Tenderness: There is no abdominal tenderness.  Neurological:     Mental Status: He is alert and oriented to person, place, and time.  Psychiatric:        Mood and Affect: Mood  normal.        Behavior: Behavior normal.     Health Maintenance Due  Topic Date Due   DTaP/Tdap/Td (1 - Tdap) Never done   Zoster Vaccines- Shingrix (1 of 2) Never done   Influenza Vaccine  10/06/2023    There are no preventive care reminders to display for this patient.   Lab Results  Component Value Date   TSH 1.570 04/04/2022   Lab Results  Component Value Date   WBC 6.8 12/22/2023   HGB 17.9 (H) 12/22/2023   HCT 54.3 (H) 12/22/2023   MCV 84.2 12/22/2023   PLT 188 12/22/2023   Lab Results  Component Value Date   NA 139 12/22/2023   K 4.2 12/22/2023   CO2 25 12/22/2023   GLUCOSE 88 12/22/2023   BUN 18 12/22/2023   CREATININE 1.01 12/22/2023   BILITOT 0.6 12/22/2023   ALKPHOS 84 12/22/2023   AST 24 12/22/2023   ALT 34 12/22/2023   PROT 6.9 12/22/2023   ALBUMIN  4.2 12/22/2023   CALCIUM  9.4 12/22/2023   ANIONGAP 5 12/22/2023   EGFR 67 11/21/2023   Lab Results  Component Value Date   CHOL 85 (L) 05/11/2023   Lab Results  Component Value Date   HDL 39 (L) 05/11/2023   Lab Results  Component Value Date   LDLCALC 25 05/11/2023   Lab Results  Component Value Date   TRIG 114 05/11/2023   Lab Results  Component Value  Date   CHOLHDL 2.2 05/11/2023   Lab Results  Component Value Date   HGBA1C 5.7 (H) 10/24/2023        Results for orders placed or performed in visit on 12/22/23  CBC with Differential (Cancer Center Only)   Collection Time: 12/22/23  9:56 AM  Result Value Ref Range   WBC Count 6.8 4.0 - 10.5 K/uL   RBC 6.45 (H) 4.22 - 5.81 MIL/uL   Hemoglobin 17.9 (H) 13.0 - 17.0 g/dL   HCT 45.6 (H) 60.9 - 47.9 %   MCV 84.2 80.0 - 100.0 fL   MCH 27.8 26.0 - 34.0 pg   MCHC 33.0 30.0 - 36.0 g/dL   RDW 84.4 88.4 - 84.4 %   Platelet Count 188 150 - 400 K/uL   nRBC 0.0 0.0 - 0.2 %   Neutrophils Relative % 59 %   Neutro Abs 4.1 1.7 - 7.7 K/uL   Lymphocytes Relative 25 %   Lymphs Abs 1.7 0.7 - 4.0 K/uL   Monocytes Relative 9 %   Monocytes  Absolute 0.6 0.1 - 1.0 K/uL   Eosinophils Relative 5 %   Eosinophils Absolute 0.3 0.0 - 0.5 K/uL   Basophils Relative 1 %   Basophils Absolute 0.1 0.0 - 0.1 K/uL   Immature Granulocytes 1 %   Abs Immature Granulocytes 0.04 0.00 - 0.07 K/uL  CMP (Cancer Center only)   Collection Time: 12/22/23  9:56 AM  Result Value Ref Range   Sodium 139 135 - 145 mmol/L   Potassium 4.2 3.5 - 5.1 mmol/L   Chloride 109 98 - 111 mmol/L   CO2 25 22 - 32 mmol/L   Glucose, Bld 88 70 - 99 mg/dL   BUN 18 8 - 23 mg/dL   Creatinine 8.98 9.38 - 1.24 mg/dL   Calcium  9.4 8.9 - 10.3 mg/dL   Total Protein 6.9 6.5 - 8.1 g/dL   Albumin  4.2 3.5 - 5.0 g/dL   AST 24 15 - 41 U/L   ALT 34 0 - 44 U/L   Alkaline Phosphatase 84 38 - 126 U/L   Total Bilirubin 0.6 0.0 - 1.2 mg/dL   GFR, Estimated >39 >39 mL/min   Anion gap 5 5 - 15  Ferritin   Collection Time: 12/22/23  9:56 AM  Result Value Ref Range   Ferritin 53 24 - 336 ng/mL     Assessment & Plan:   Assessment & Plan Paroxysmal atrial fibrillation (HCC) Premature atrial contractions (PACs) Intermittent PACs noted on last cardiology note. EKG today shows normal P waves, ruling out AFib. Symptoms may be linked to Linzess  affecting electrolytes.  - Monitor PACs frequency and symptoms. - Coordinate with cardiologist if symptoms persist or worsen. - Consider electrolyte management if symptoms correlate with Linzess  use. Orders:   EKG 12-Lead  Chronic idiopathic constipation Constipation Chronic constipation with Linzess  use causing electrolyte imbalance. Current management with suppositories and enemas is ineffective. Possible stricture at previous intestinal reattachment site. - Continue Ensure for nutritional support. - Schedule barium enema in two weeks to evaluate for stricture. - Prescribe stronger suppository if available. - Consider alternative bowel management strategies if current regimen is ineffective. Orders:   bisacodyl  (DULCOLAX) 10 MG  suppository; Place 1 suppository (10 mg total) rectally as needed for moderate constipation.  Primary insomnia Insomnia Chronic insomnia persists despite trials of Ambien , Lunesta , and cost-prohibitive Ramelteon  and Suvorexant . Clonazepam  led to tolerance. Anxiety exacerbates sleep disturbance. He seeks effective sleep aid. - Prescribe short-term Ativan  until  clonazepam  refill. - Explore non-hypnotic sleep medications such as lemborexant. - Avoid over-the-counter sleep aids. - Consider alternative sleep aids outside the hypnotic class. Orders:   Daridorexant HCl (QUVIVIQ) 50 MG TABS; Take 1 tablet (50 mg total) by mouth at bedtime.  Anxiety Continue to monitor symptoms Will send new sleep medicine  Take Clonazepam  1mg  only for prevention of anxiety attacks If new sleep medicine un affective will consider referral for insomnia to specialist.     Coronary artery disease involving native coronary artery of native heart with unstable angina pectoris (HCC)  Orders:   clonazePAM  (KLONOPIN ) 1 MG tablet; TAKE 1 TABLET BY MOUTH TWICE A DAY AS NEEDED FOR ANXIETY     Body mass index is 29 kg/m..  Meds ordered this encounter  Medications   Daridorexant HCl (QUVIVIQ) 50 MG TABS    Sig: Take 1 tablet (50 mg total) by mouth at bedtime.    Dispense:  30 tablet    Refill:  3    Patient has tried and failed: Ambien , Lunesta , Ramelteon , Belsomra , Midazolam    bisacodyl  (DULCOLAX) 10 MG suppository    Sig: Place 1 suppository (10 mg total) rectally as needed for moderate constipation.    Dispense:  12 suppository    Refill:  0    Orders Placed This Encounter  Procedures   EKG 12-Lead     Follow-up: No follow-ups on file.  An After Visit Summary was printed and given to the patient.   I,Lauren M Auman,acting as a neurosurgeon for [employer], PA.,have documented all relevant documentation on the behalf of Nola Angles, PA,as directed by  Nola Angles, PA while in the presence of Nola Angles,  [name].    Nola Angles, [name] [facility] [phone]

## 2024-01-02 NOTE — Assessment & Plan Note (Signed)
 Orders:    EKG 12 Lead

## 2024-01-03 MED ORDER — CLONAZEPAM 1 MG PO TABS: ORAL_TABLET | ORAL | 0 refills | Status: DC

## 2024-01-03 NOTE — Assessment & Plan Note (Signed)
 Constipation Chronic constipation with Linzess  use causing electrolyte imbalance. Current management with suppositories and enemas is ineffective. Possible stricture at previous intestinal reattachment site. - Continue Ensure for nutritional support. - Schedule barium enema in two weeks to evaluate for stricture. - Prescribe stronger suppository if available. - Consider alternative bowel management strategies if current regimen is ineffective. Orders:   bisacodyl  (DULCOLAX) 10 MG suppository; Place 1 suppository (10 mg total) rectally as needed for moderate constipation.

## 2024-01-03 NOTE — Assessment & Plan Note (Signed)
 Continue to monitor symptoms Will send new sleep medicine  Take Clonazepam  1mg  only for prevention of anxiety attacks If new sleep medicine un affective will consider referral for insomnia to specialist.

## 2024-01-03 NOTE — Assessment & Plan Note (Signed)
 Insomnia Chronic insomnia persists despite trials of Ambien , Lunesta , and cost-prohibitive Ramelteon  and Suvorexant . Clonazepam  led to tolerance. Anxiety exacerbates sleep disturbance. He seeks effective sleep aid. - Prescribe short-term Ativan  until clonazepam  refill. - Explore non-hypnotic sleep medications such as lemborexant. - Avoid over-the-counter sleep aids. - Consider alternative sleep aids outside the hypnotic class. Orders:   Daridorexant HCl (QUVIVIQ) 50 MG TABS; Take 1 tablet (50 mg total) by mouth at bedtime.

## 2024-01-19 ENCOUNTER — Other Ambulatory Visit: Payer: Self-pay | Admitting: *Deleted

## 2024-01-19 ENCOUNTER — Inpatient Hospital Stay: Attending: Hematology and Oncology

## 2024-01-19 ENCOUNTER — Inpatient Hospital Stay

## 2024-01-19 VITALS — BP 127/81 | HR 71 | Temp 98.0°F | Resp 20

## 2024-01-19 DIAGNOSIS — Z79890 Hormone replacement therapy (postmenopausal): Secondary | ICD-10-CM | POA: Insufficient documentation

## 2024-01-19 DIAGNOSIS — D751 Secondary polycythemia: Secondary | ICD-10-CM

## 2024-01-19 DIAGNOSIS — G4733 Obstructive sleep apnea (adult) (pediatric): Secondary | ICD-10-CM | POA: Insufficient documentation

## 2024-01-19 LAB — CBC WITH DIFFERENTIAL (CANCER CENTER ONLY)
Abs Immature Granulocytes: 0.03 K/uL (ref 0.00–0.07)
Basophils Absolute: 0.1 K/uL (ref 0.0–0.1)
Basophils Relative: 1 %
Eosinophils Absolute: 0.2 K/uL (ref 0.0–0.5)
Eosinophils Relative: 2 %
HCT: 55.3 % — ABNORMAL HIGH (ref 39.0–52.0)
Hemoglobin: 18.5 g/dL — ABNORMAL HIGH (ref 13.0–17.0)
Immature Granulocytes: 0 %
Lymphocytes Relative: 14 %
Lymphs Abs: 1.4 K/uL (ref 0.7–4.0)
MCH: 27.8 pg (ref 26.0–34.0)
MCHC: 33.5 g/dL (ref 30.0–36.0)
MCV: 83.2 fL (ref 80.0–100.0)
Monocytes Absolute: 0.7 K/uL (ref 0.1–1.0)
Monocytes Relative: 7 %
Neutro Abs: 7.7 K/uL (ref 1.7–7.7)
Neutrophils Relative %: 76 %
Platelet Count: 213 K/uL (ref 150–400)
RBC: 6.65 MIL/uL — ABNORMAL HIGH (ref 4.22–5.81)
RDW: 16.7 % — ABNORMAL HIGH (ref 11.5–15.5)
WBC Count: 10 K/uL (ref 4.0–10.5)
nRBC: 0 % (ref 0.0–0.2)

## 2024-01-19 LAB — CMP (CANCER CENTER ONLY)
ALT: 27 U/L (ref 0–44)
AST: 21 U/L (ref 15–41)
Albumin: 4.5 g/dL (ref 3.5–5.0)
Alkaline Phosphatase: 82 U/L (ref 38–126)
Anion gap: 7 (ref 5–15)
BUN: 19 mg/dL (ref 8–23)
CO2: 23 mmol/L (ref 22–32)
Calcium: 9.3 mg/dL (ref 8.9–10.3)
Chloride: 107 mmol/L (ref 98–111)
Creatinine: 1.19 mg/dL (ref 0.61–1.24)
GFR, Estimated: >60 mL/min (ref >60)
Glucose, Bld: 93 mg/dL (ref 70–99)
Potassium: 4.4 mmol/L (ref 3.5–5.1)
Sodium: 137 mmol/L (ref 135–145)
Total Bilirubin: 0.7 mg/dL (ref 0.0–1.2)
Total Protein: 7.2 g/dL (ref 6.5–8.1)

## 2024-01-19 LAB — PSA: Prostatic Specific Antigen: 0.22 ng/mL (ref 0.00–4.00)

## 2024-01-19 LAB — FERRITIN: Ferritin: 59 ng/mL (ref 24–336)

## 2024-01-19 NOTE — Patient Instructions (Signed)

## 2024-01-19 NOTE — Progress Notes (Signed)
 Jacob Collier presents today for phlebotomy per MD orders. Phlebotomy procedure started at 1557 and ended at 1602. 558 grams removed via LAC 16G Patient observed for 10 minutes after procedure without any incident. Declined full 30 min observation. Patient tolerated procedure well. IV needle removed intact.

## 2024-01-22 ENCOUNTER — Other Ambulatory Visit: Payer: Self-pay

## 2024-01-22 DIAGNOSIS — F5101 Primary insomnia: Secondary | ICD-10-CM

## 2024-01-23 DIAGNOSIS — K5730 Diverticulosis of large intestine without perforation or abscess without bleeding: Secondary | ICD-10-CM | POA: Diagnosis not present

## 2024-01-23 DIAGNOSIS — K5909 Other constipation: Secondary | ICD-10-CM | POA: Diagnosis not present

## 2024-01-23 DIAGNOSIS — K5904 Chronic idiopathic constipation: Secondary | ICD-10-CM | POA: Diagnosis not present

## 2024-02-03 ENCOUNTER — Other Ambulatory Visit: Payer: Self-pay | Admitting: Physician Assistant

## 2024-02-03 DIAGNOSIS — Z86711 Personal history of pulmonary embolism: Secondary | ICD-10-CM

## 2024-02-15 ENCOUNTER — Other Ambulatory Visit: Payer: Self-pay | Admitting: Student

## 2024-02-16 ENCOUNTER — Inpatient Hospital Stay: Attending: Hematology and Oncology

## 2024-02-16 DIAGNOSIS — G4733 Obstructive sleep apnea (adult) (pediatric): Secondary | ICD-10-CM | POA: Diagnosis not present

## 2024-02-16 DIAGNOSIS — D751 Secondary polycythemia: Secondary | ICD-10-CM | POA: Diagnosis present

## 2024-02-16 DIAGNOSIS — Z8546 Personal history of malignant neoplasm of prostate: Secondary | ICD-10-CM | POA: Insufficient documentation

## 2024-02-16 LAB — FERRITIN: Ferritin: 109 ng/mL (ref 24–336)

## 2024-02-16 LAB — CMP (CANCER CENTER ONLY)
ALT: 33 U/L (ref 0–44)
AST: 28 U/L (ref 15–41)
Albumin: 4.4 g/dL (ref 3.5–5.0)
Alkaline Phosphatase: 84 U/L (ref 38–126)
Anion gap: 11 (ref 5–15)
BUN: 14 mg/dL (ref 8–23)
CO2: 21 mmol/L — ABNORMAL LOW (ref 22–32)
Calcium: 9.1 mg/dL (ref 8.9–10.3)
Chloride: 106 mmol/L (ref 98–111)
Creatinine: 1.23 mg/dL (ref 0.61–1.24)
GFR, Estimated: >60 mL/min (ref >60)
Glucose, Bld: 91 mg/dL (ref 70–99)
Potassium: 4.3 mmol/L (ref 3.5–5.1)
Sodium: 138 mmol/L (ref 135–145)
Total Bilirubin: 0.4 mg/dL (ref 0.0–1.2)
Total Protein: 7.1 g/dL (ref 6.5–8.1)

## 2024-02-16 LAB — CBC WITH DIFFERENTIAL (CANCER CENTER ONLY)
Abs Immature Granulocytes: 0.04 K/uL (ref 0.00–0.07)
Basophils Absolute: 0.1 K/uL (ref 0.0–0.1)
Basophils Relative: 1 %
Eosinophils Absolute: 0.2 K/uL (ref 0.0–0.5)
Eosinophils Relative: 2 %
HCT: 51.4 % (ref 39.0–52.0)
Hemoglobin: 17.4 g/dL — ABNORMAL HIGH (ref 13.0–17.0)
Immature Granulocytes: 0 %
Lymphocytes Relative: 19 %
Lymphs Abs: 1.8 K/uL (ref 0.7–4.0)
MCH: 28 pg (ref 26.0–34.0)
MCHC: 33.9 g/dL (ref 30.0–36.0)
MCV: 82.8 fL (ref 80.0–100.0)
Monocytes Absolute: 0.7 K/uL (ref 0.1–1.0)
Monocytes Relative: 7 %
Neutro Abs: 6.7 K/uL (ref 1.7–7.7)
Neutrophils Relative %: 71 %
Platelet Count: 223 K/uL (ref 150–400)
RBC: 6.21 MIL/uL — ABNORMAL HIGH (ref 4.22–5.81)
RDW: 15.7 % — ABNORMAL HIGH (ref 11.5–15.5)
WBC Count: 9.5 K/uL (ref 4.0–10.5)
nRBC: 0 % (ref 0.0–0.2)

## 2024-02-16 MED ORDER — AMLODIPINE BESYLATE 5 MG PO TABS: ORAL_TABLET | ORAL | 2 refills | Status: AC

## 2024-02-20 ENCOUNTER — Telehealth: Payer: Self-pay

## 2024-02-20 ENCOUNTER — Telehealth: Payer: Self-pay | Admitting: Hematology and Oncology

## 2024-02-20 NOTE — Telephone Encounter (Signed)
 I spoke with patient to schedule phlebotomy infusion on 02/26/2024. Patient aware of date/time.

## 2024-02-20 NOTE — Telephone Encounter (Signed)
 Pt called and left message requesting a phlebotomy. Per treatment plan perimeters, his lab results qualify for this treatment.  Message sent to scheduling requesting an appt.  TC returned to pt.  Left message advising him that scheduling would contact him for this appt.

## 2024-02-23 ENCOUNTER — Other Ambulatory Visit: Payer: Self-pay | Admitting: Cardiology

## 2024-02-26 ENCOUNTER — Inpatient Hospital Stay

## 2024-02-26 ENCOUNTER — Other Ambulatory Visit: Payer: Self-pay | Admitting: Hematology and Oncology

## 2024-02-26 ENCOUNTER — Telehealth: Payer: Self-pay | Admitting: *Deleted

## 2024-02-26 VITALS — BP 125/73 | HR 63 | Temp 97.9°F | Resp 17

## 2024-02-26 DIAGNOSIS — D751 Secondary polycythemia: Secondary | ICD-10-CM | POA: Diagnosis not present

## 2024-02-26 LAB — CBC WITH DIFFERENTIAL (CANCER CENTER ONLY)
Abs Immature Granulocytes: 0.02 K/uL (ref 0.00–0.07)
Basophils Absolute: 0.1 K/uL (ref 0.0–0.1)
Basophils Relative: 1 %
Eosinophils Absolute: 0.2 K/uL (ref 0.0–0.5)
Eosinophils Relative: 2 %
HCT: 53.4 % — ABNORMAL HIGH (ref 39.0–52.0)
Hemoglobin: 17.8 g/dL — ABNORMAL HIGH (ref 13.0–17.0)
Immature Granulocytes: 0 %
Lymphocytes Relative: 23 %
Lymphs Abs: 2.1 K/uL (ref 0.7–4.0)
MCH: 27.7 pg (ref 26.0–34.0)
MCHC: 33.3 g/dL (ref 30.0–36.0)
MCV: 83 fL (ref 80.0–100.0)
Monocytes Absolute: 0.7 K/uL (ref 0.1–1.0)
Monocytes Relative: 8 %
Neutro Abs: 6 K/uL (ref 1.7–7.7)
Neutrophils Relative %: 66 %
Platelet Count: 181 K/uL (ref 150–400)
RBC: 6.43 MIL/uL — ABNORMAL HIGH (ref 4.22–5.81)
RDW: 16.4 % — ABNORMAL HIGH (ref 11.5–15.5)
WBC Count: 9.1 K/uL (ref 4.0–10.5)
nRBC: 0 % (ref 0.0–0.2)

## 2024-02-26 LAB — CMP (CANCER CENTER ONLY)
ALT: 29 U/L (ref 0–44)
AST: 29 U/L (ref 15–41)
Albumin: 4.4 g/dL (ref 3.5–5.0)
Alkaline Phosphatase: 87 U/L (ref 38–126)
Anion gap: 10 (ref 5–15)
BUN: 18 mg/dL (ref 8–23)
CO2: 22 mmol/L (ref 22–32)
Calcium: 9.3 mg/dL (ref 8.9–10.3)
Chloride: 106 mmol/L (ref 98–111)
Creatinine: 1.17 mg/dL (ref 0.61–1.24)
GFR, Estimated: >60 mL/min
Glucose, Bld: 89 mg/dL (ref 70–99)
Potassium: 4.7 mmol/L (ref 3.5–5.1)
Sodium: 138 mmol/L (ref 135–145)
Total Bilirubin: 0.6 mg/dL (ref 0.0–1.2)
Total Protein: 7.2 g/dL (ref 6.5–8.1)

## 2024-02-26 LAB — FERRITIN: Ferritin: 144 ng/mL (ref 24–336)

## 2024-02-26 NOTE — Progress Notes (Signed)
 Saliou L Balsley presents today for phlebotomy per MD orders. Phlebotomy procedure started at 1520 and ended at 1525. 515 grams removed. Patient declined post-procedure observation. Patient tolerated procedure well. IV needle removed intact. VSS at discharge.  Ambulated to lobby.

## 2024-02-26 NOTE — Telephone Encounter (Signed)
 TCT patient to advise that we have scheduled a lab appt for him today @ 2pm prior to his phlebotomy.SABRA Spoke with him and he states he will be here @ 2pm.

## 2024-02-26 NOTE — Patient Instructions (Signed)

## 2024-03-12 ENCOUNTER — Encounter: Payer: Self-pay | Admitting: Hematology and Oncology

## 2024-03-12 ENCOUNTER — Ambulatory Visit: Admitting: Physician Assistant

## 2024-03-12 ENCOUNTER — Encounter: Payer: Self-pay | Admitting: Physician Assistant

## 2024-03-12 VITALS — BP 144/82 | HR 73 | Temp 97.8°F | Ht 72.0 in | Wt 224.0 lb

## 2024-03-12 DIAGNOSIS — R300 Dysuria: Secondary | ICD-10-CM | POA: Diagnosis not present

## 2024-03-12 DIAGNOSIS — N3001 Acute cystitis with hematuria: Secondary | ICD-10-CM | POA: Diagnosis not present

## 2024-03-12 DIAGNOSIS — G4733 Obstructive sleep apnea (adult) (pediatric): Secondary | ICD-10-CM

## 2024-03-12 DIAGNOSIS — F5101 Primary insomnia: Secondary | ICD-10-CM | POA: Diagnosis not present

## 2024-03-12 DIAGNOSIS — I25110 Atherosclerotic heart disease of native coronary artery with unstable angina pectoris: Secondary | ICD-10-CM

## 2024-03-12 DIAGNOSIS — F419 Anxiety disorder, unspecified: Secondary | ICD-10-CM

## 2024-03-12 DIAGNOSIS — M5432 Sciatica, left side: Secondary | ICD-10-CM | POA: Diagnosis not present

## 2024-03-12 LAB — POCT URINALYSIS DIP (CLINITEK)
Bilirubin, UA: NEGATIVE
Glucose, UA: NEGATIVE mg/dL
Ketones, POC UA: NEGATIVE mg/dL
Nitrite, UA: NEGATIVE
Spec Grav, UA: 1.02
Urobilinogen, UA: NEGATIVE U/dL — AB
pH, UA: 6

## 2024-03-12 MED ORDER — CLONAZEPAM 1 MG PO TABS: ORAL_TABLET | ORAL | 0 refills | Status: AC

## 2024-03-12 MED ORDER — TRIAMCINOLONE ACETONIDE 40 MG/ML IJ SUSP
80.0000 mg | Freq: Once | INTRAMUSCULAR | Status: AC
  Administered 2024-03-12: 80 mg via INTRAMUSCULAR

## 2024-03-12 MED ORDER — TRIAZOLAM 0.25 MG PO TABS: 0.2500 mg | ORAL_TABLET | Freq: Every evening | ORAL | 0 refills | Status: AC | PRN

## 2024-03-12 NOTE — Progress Notes (Signed)
 "  Acute Office Visit  Subjective:    Patient ID: Jacob Collier, male    DOB: 1955/11/15, 69 y.o.   MRN: [id number]  Chief Complaint  Patient presents with   Discuss medications for sleep    HPI: Patient is in today for tapering off clonazipam to triazolam  due to erectile dysfunction and continued issues with sleep.   Discussed the use of AI scribe software for clinical note transcription with the patient, who gave verbal consent to proceed.  History of Present Illness Jacob Collier is a 69 year old male with a history of prostatitis who presents with insomnia and urinary symptoms.  He experiences darker urine and difficulty providing a urine sample, suspecting dehydration. He has low-grade fevers, with his temperature rising from his usual 97.20F to 98.10F. With a history of prostatitis, he is concerned about potential urinary infections due to urine retention. A previous blood test showed his white blood cell count was towards the upper end of normal.  He is currently taking Klonopin  for sleep and anxiety but wishes to discontinue it due to side effects, including erectile dysfunction and withdrawal symptoms such as racing thoughts and insomnia. He describes the withdrawal as severe, leading to five days of sleeplessness. He has previously tried tapering off Klonopin  without success and is considering alternative medications with shorter half-lives, such as triazolam  or trazodone, to manage his insomnia during withdrawal.  He has a history of cancer treated in 2015 and has been on Klonopin  since then to manage anxiety and sleep issues. He also has a history of sleep apnea, which he believes contributes to his elevated red blood cell count. He is undergoing therapeutic phlebotomy to manage this condition.  He reports joint pain and has previously been evaluated for rheumatoid arthritis, which was ruled out. He finds relief from joint pain with Kenalog  injections, which he receives  approximately every three months.  He is trying to improve his nutrition by adding Ensure to his diet, which he finds beneficial. His weight and blood pressure fluctuate, with recent increases attributed to holiday eating. He remains active and is conscious of maintaining his health post-retirement. No current anxiety but notes past anxiety related to work. No current flank pain.     Past Medical History:  Diagnosis Date   Anxiety    Arthritis    Coronary artery disease    a. S/p DES to RCA in 01/2019 b. s/p CABG x4 (LIMA-LAD, reverse SVG-PDA, reverse SVG-OM1, reverse SVG-1st Diag) in 01/2021   Diverticulosis    Dyspnea    Hard of hearing    Hyperlipidemia    Hypertension    hx of elevation on lyrica , off med and now normal    Paroxysmal SVT (supraventricular tachycardia)    Post-op atrial fibrillation    Prostate cancer (HCC)    Wears glasses     Past Surgical History:  Procedure Laterality Date   A-FLUTTER ABLATION N/A 11/23/2023   Procedure: A-FLUTTER ABLATION;  Surgeon: Nancey Eulas BRAVO, MD;  Location: [facility] INVASIVE CV LAB;  Service: Cardiovascular;  Laterality: N/A;   ANTERIOR CERVICAL DECOMP/DISCECTOMY FUSION N/A 09/03/2021   Procedure: CERVICAL FIVE-SIX, CERVICAL SIX-SEVEN ANTERIOR CERVICAL DECOMPRESSION/DISCECTOMY FUSION;  Surgeon: Louis Shove, MD;  Location: [facility] OR;  Service: Neurosurgery;  Laterality: N/A;   CARDIAC CATHETERIZATION     with 2 stents    CARDIOVERSION N/A 08/30/2023   Procedure: CARDIOVERSION;  Surgeon: Raford Riggs, MD;  Location: [facility] INVASIVE CV LAB;  Service: Cardiovascular;  Laterality:  N/A;   COLECTOMY  05/2018   COLONOSCOPY     CORONARY ARTERY BYPASS GRAFT N/A 01/19/2021   Procedure: CORONARY ARTERY BYPASS GRAFTING (CABG), ON PUMP TIMES FOUR, USING LEFT INTERNAL MAMMARY ARTERY AND ENDOSOCPICALLY HARVESTED RIGHT GREATER SAPHENOUS VEIN;  Surgeon: Shyrl Linnie KIDD, MD;  Location: [facility] OR;  Service: Open Heart Surgery;  Laterality: N/A;    CYSTOSCOPY WITH URETHRAL DILATATION N/A 01/14/2021   Procedure: CYSTOSCOPY WITH BALLOON  DILATATION;  Surgeon: Gaston Hamilton, MD;  Location: [facility] ORS;  Service: Urology;  Laterality: N/A;   EYE SURGERY  02/2020   bilaterl cataracts   IR THORACENTESIS ASP PLEURAL SPACE W/IMG GUIDE  02/02/2021   LEFT HEART CATH AND CORONARY ANGIOGRAPHY N/A 12/24/2020   Procedure: LEFT HEART CATH AND CORONARY ANGIOGRAPHY;  Surgeon: Jordan, Peter M, MD;  Location: [facility] INVASIVE CV LAB;  Service: Cardiovascular;  Laterality: N/A;   PROSTATECTOMY     SVT ABLATION N/A 10/20/2022   Procedure: SVT ABLATION;  Surgeon: Nancey Eulas BRAVO, MD;  Location: [facility] INVASIVE CV LAB;  Service: Cardiovascular;  Laterality: N/A;   TEE WITHOUT CARDIOVERSION N/A 01/19/2021   Procedure: TRANSESOPHAGEAL ECHOCARDIOGRAM (TEE);  Surgeon: Shyrl Linnie KIDD, MD;  Location: [facility] OR;  Service: Open Heart Surgery;  Laterality: N/A;   TOTAL HIP ARTHROPLASTY Left 11/10/2015   Procedure: LEFT TOTAL HIP ARTHROPLASTY ANTERIOR APPROACH;  Surgeon: Lonni CINDERELLA Poli, MD;  Location: [facility] OR;  Service: Orthopedics;  Laterality: Left;    Family History  Problem Relation Age of Onset   Alzheimer's disease Mother    Stroke Father    Diabetes Sister    Colon polyps Brother    Heart attack Maternal Grandmother    Cancer Maternal Uncle        prostate   Prostate cancer Maternal Uncle    Cancer Maternal Uncle        prostate   Prostate cancer Maternal Uncle    Colon cancer Neg Hx    Esophageal cancer Neg Hx    Rectal cancer Neg Hx    Stomach cancer Neg Hx     Social History   Socioeconomic History   Marital status: Married    Spouse name: Not on file   Number of children: 0   Years of education: Not on file   Highest education level: Not on file  Occupational History   Occupation: Employed at [employer]  Tobacco Use   Smoking status: Never    Passive exposure: Never   Smokeless tobacco: Never  Vaping Use   Vaping status: Never  Used  Substance and Sexual Activity   Alcohol use: Not Currently    Alcohol/week: 0.0 standard drinks of alcohol    Comment: rarely   Drug use: No   Sexual activity: Yes    Partners: Female    Birth control/protection: None  Other Topics Concern   Not on file  Social History Narrative   Not on file   Social Drivers of Health   Tobacco Use: Low Risk (03/12/2024)   Patient History    Smoking Tobacco Use: Never    Smokeless Tobacco Use: Never    Passive Exposure: Never  Financial Resource Strain: Low Risk (12/14/2023)   Overall Financial Resource Strain (CARDIA)    Difficulty of Paying Living Expenses: Not hard at all  Food Insecurity: No Food Insecurity (12/14/2023)   Epic    Worried About Radiation Protection Practitioner of Food in the Last Year: Never true    Ran Out of Food in the  Last Year: Never true  Transportation Needs: No Transportation Needs (12/14/2023)   Epic    Lack of Transportation (Medical): No    Lack of Transportation (Non-Medical): No  Physical Activity: Insufficiently Active (12/14/2023)   Exercise Vital Sign    Days of Exercise per Week: 3 days    Minutes of Exercise per Session: 30 min  Stress: No Stress Concern Present (12/14/2023)   [employer] of Occupational Health - Occupational Stress Questionnaire    Feeling of Stress: Not at all  Social Connections: Moderately Isolated (12/14/2023)   Social Connection and Isolation Panel    Frequency of Communication with Friends and Family: More than three times a week    Frequency of Social Gatherings with Friends and Family: More than three times a week    Attends Religious Services: Never    Database Administrator or Organizations: No    Attends Banker Meetings: Never    Marital Status: Married  Catering Manager Violence: Not At Risk (12/14/2023)   Epic    Fear of Current or Ex-Partner: No    Emotionally Abused: No    Physically Abused: No    Sexually Abused: No  Depression (PHQ2-9): Low Risk (02/26/2024)    Depression (PHQ2-9)    PHQ-2 Score: 0  Alcohol Screen: Low Risk (12/14/2023)   Alcohol Screen    Last Alcohol Screening Score (AUDIT): 0  Housing: Low Risk (12/14/2023)   Epic    Unable to Pay for Housing in the Last Year: No    Number of Times Moved in the Last Year: 0    Homeless in the Last Year: No  Utilities: Not At Risk (12/14/2023)   Epic    Threatened with loss of utilities: No  Health Literacy: Adequate Health Literacy (12/14/2023)   B1300 Health Literacy    Frequency of need for help with medical instructions: Never    Outpatient Medications Prior to Visit  Medication Sig Dispense Refill   amLODipine  (NORVASC ) 5 MG tablet Take 1 tablet (5mg ) in the morning and 1/2 tablet (2.5mg ) in the evening. 90 tablet 2   aspirin  EC 81 MG tablet Take 81 mg by mouth daily. Swallow whole.     bisacodyl  (DULCOLAX) 10 MG suppository Place 1 suppository (10 mg total) rectally as needed for moderate constipation. 12 suppository 0   clonazePAM  (KLONOPIN ) 1 MG tablet TAKE 1 TABLET BY MOUTH TWICE A DAY AS NEEDED FOR ANXIETY 60 tablet 0   clopidogrel  (PLAVIX ) 75 MG tablet TAKE 1 TABLET BY MOUTH EVERY DAY 90 tablet 0   Daridorexant  HCl (QUVIVIQ ) 50 MG TABS Take 1 tablet (50 mg total) by mouth at bedtime. 30 tablet 3   Evolocumab  (REPATHA  SURECLICK) 140 MG/ML SOAJ INJECT 140 MG INTO THE SKIN EVERY 14 (FOURTEEN) DAYS. 6 mL 3   ezetimibe  (ZETIA ) 10 MG tablet TAKE 1 TABLET BY MOUTH EVERY DAY 90 tablet 2   hydrocortisone -pramoxine (PROCTOFOAM-HC) rectal foam Place 1 applicator rectally 2 (two) times daily. (Patient taking differently: Place 1 applicator rectally 2 (two) times daily as needed for hemorrhoids.) 10 g 3   metoprolol  succinate (TOPROL -XL) 25 MG 24 hr tablet Take 0.5 tablets (12.5 mg total) by mouth at bedtime. 45 tablet 2   PRESCRIPTION MEDICATION 1 application  daily as needed (ED). [facility] - [place], [place] - [address]  TRIMIX PAPAVERINE/PHENTOLAMINE/ALPROSTADIL  30MG /1MG /10MCG/ML     No facility-administered medications prior to visit.    Allergies[1]  Review of Systems  Constitutional:  Negative for appetite change, fatigue and fever.  HENT:  Negative for congestion, ear pain, sinus pressure and sore throat.   Respiratory:  Negative for cough, chest tightness, shortness of breath and wheezing.   Cardiovascular:  Negative for chest pain and palpitations.  Gastrointestinal:  Negative for abdominal pain, constipation, diarrhea, nausea and vomiting.  Genitourinary:  Negative for dysuria and hematuria.  Musculoskeletal:  Negative for arthralgias, back pain, joint swelling and myalgias.  Skin:  Negative for rash.  Neurological:  Negative for dizziness, weakness and headaches.  Psychiatric/Behavioral:  Positive for sleep disturbance. Negative for dysphoric mood. The patient is not nervous/anxious.        Objective:        03/12/2024    1:37 PM 02/26/2024    3:29 PM 02/26/2024    3:06 PM  Vitals with BMI  Height 6' 0    Weight 224 lbs    BMI 30.37    Systolic 144 125 858  Diastolic 82 73 67  Pulse 73 63 63    Orthostatic VS for the past 72 hrs (Last 3 readings):  Patient Position BP Location  03/12/24 1337 Sitting Left Arm     Physical Exam Vitals reviewed.  Constitutional:      Appearance: Normal appearance.  Cardiovascular:     Rate and Rhythm: Normal rate and regular rhythm.     Heart sounds: Normal heart sounds.  Pulmonary:     Effort: Pulmonary effort is normal.     Breath sounds: Normal breath sounds.  Abdominal:     General: Bowel sounds are normal.     Palpations: Abdomen is soft.     Tenderness: There is no abdominal tenderness.  Neurological:     Mental Status: He is alert and oriented to person, place, and time.  Psychiatric:        Mood and Affect: Mood normal.        Behavior: Behavior normal.     Health Maintenance Due  Topic Date Due   DTaP/Tdap/Td (1 - Tdap) Never done   Zoster Vaccines- Shingrix (1  of 2) Never done   Influenza Vaccine  10/06/2023    There are no preventive care reminders to display for this patient.   Lab Results  Component Value Date   TSH 1.570 04/04/2022   Lab Results  Component Value Date   WBC 9.1 02/26/2024   HGB 17.8 (H) 02/26/2024   HCT 53.4 (H) 02/26/2024   MCV 83.0 02/26/2024   PLT 181 02/26/2024   Lab Results  Component Value Date   NA 138 02/26/2024   K 4.7 02/26/2024   CO2 22 02/26/2024   GLUCOSE 89 02/26/2024   BUN 18 02/26/2024   CREATININE 1.17 02/26/2024   BILITOT 0.6 02/26/2024   ALKPHOS 87 02/26/2024   AST 29 02/26/2024   ALT 29 02/26/2024   PROT 7.2 02/26/2024   ALBUMIN  4.4 02/26/2024   CALCIUM  9.3 02/26/2024   ANIONGAP 10 02/26/2024   EGFR 67 11/21/2023   Lab Results  Component Value Date   CHOL 85 (L) 05/11/2023   Lab Results  Component Value Date   HDL 39 (L) 05/11/2023   Lab Results  Component Value Date   LDLCALC 25 05/11/2023   Lab Results  Component Value Date   TRIG 114 05/11/2023   Lab Results  Component Value Date   CHOLHDL 2.2 05/11/2023   Lab Results  Component Value Date   HGBA1C 5.7 (H) 10/24/2023  Results for orders placed or performed in visit on 02/26/24  CBC with Differential (Cancer Center Only)   Collection Time: 02/26/24  2:41 PM  Result Value Ref Range   WBC Count 9.1 4.0 - 10.5 K/uL   RBC 6.43 (H) 4.22 - 5.81 MIL/uL   Hemoglobin 17.8 (H) 13.0 - 17.0 g/dL   HCT 46.5 (H) 60.9 - 47.9 %   MCV 83.0 80.0 - 100.0 fL   MCH 27.7 26.0 - 34.0 pg   MCHC 33.3 30.0 - 36.0 g/dL   RDW 83.5 (H) 88.4 - 84.4 %   Platelet Count 181 150 - 400 K/uL   nRBC 0.0 0.0 - 0.2 %   Neutrophils Relative % 66 %   Neutro Abs 6.0 1.7 - 7.7 K/uL   Lymphocytes Relative 23 %   Lymphs Abs 2.1 0.7 - 4.0 K/uL   Monocytes Relative 8 %   Monocytes Absolute 0.7 0.1 - 1.0 K/uL   Eosinophils Relative 2 %   Eosinophils Absolute 0.2 0.0 - 0.5 K/uL   Basophils Relative 1 %   Basophils Absolute 0.1 0.0 - 0.1  K/uL   Immature Granulocytes 0 %   Abs Immature Granulocytes 0.02 0.00 - 0.07 K/uL  CMP (Cancer Center only)   Collection Time: 02/26/24  2:41 PM  Result Value Ref Range   Sodium 138 135 - 145 mmol/L   Potassium 4.7 3.5 - 5.1 mmol/L   Chloride 106 98 - 111 mmol/L   CO2 22 22 - 32 mmol/L   Glucose, Bld 89 70 - 99 mg/dL   BUN 18 8 - 23 mg/dL   Creatinine 8.82 9.38 - 1.24 mg/dL   Calcium  9.3 8.9 - 10.3 mg/dL   Total Protein 7.2 6.5 - 8.1 g/dL   Albumin  4.4 3.5 - 5.0 g/dL   AST 29 15 - 41 U/L   ALT 29 0 - 44 U/L   Alkaline Phosphatase 87 38 - 126 U/L   Total Bilirubin 0.6 0.0 - 1.2 mg/dL   GFR, Estimated >39 >39 mL/min   Anion gap 10 5 - 15  Ferritin   Collection Time: 02/26/24  2:41 PM  Result Value Ref Range   Ferritin 144 24 - 336 ng/mL     Assessment & Plan:   Assessment & Plan Coronary artery disease involving native coronary artery of native heart with unstable angina pectoris (HCC) Coronary artery disease with history of arrhythmia and ablation Improved cardiac function post-ablation. Advised to avoid flu and COVID vaccines due to potential cardiac side effects. - Continue current cardiac management and monitoring. Orders:   clonazePAM  (KLONOPIN ) 1 MG tablet; TAKE 1 TABLET BY MOUTH TWICE A DAY AS NEEDED FOR ANXIETY  Anxiety Anxiety disorder Managed with Klonopin , which he wishes to discontinue. No significant anxiety symptoms since retirement, but anxiety in claustrophobic situations. - Provided one refill of Klonopin  for emergency use in claustrophobic situations. Orders:   clonazePAM  (KLONOPIN ) 1 MG tablet; TAKE 1 TABLET BY MOUTH TWICE A DAY AS NEEDED FOR ANXIETY  Primary insomnia Chronic insomnia with benzodiazepine dependence and withdrawal Chronic insomnia worsened by Klonopin  use. He wishes to discontinue due to side effects and withdrawal symptoms. Considering triazolam  or trazodone for sleep management. - Prescribed triazolam  for sleep management during  Klonopin  withdrawal. - Provided one refill of Klonopin  for emergency use only. - Plan follow-up appointment to discuss transition to trazodone if triazolam  is ineffective. Orders:   triazolam  (HALCION ) 0.25 MG tablet; Take 1 tablet (0.25 mg total) by mouth at  bedtime as needed for sleep.  Sciatica without back pain, left Osteoarthritis Chronic joint pain relieved by Kenalog  injections. - Administered Kenalog  injection for joint pain management. Orders:   triamcinolone  acetonide (KENALOG -40) injection 80 mg  Dysuria Sent sample for culture Continue to monitor symptoms Will send in antibiotic depending on culture.  Orders:   POCT URINALYSIS DIP (CLINITEK)  Acute cystitis with hematuria Neurogenic bladder with urinary retention, potential for urinary tract infections. Reports dark urine and low-grade fever, suggesting possible infection. - Provided urine cup for sample collection to test for infection. - Continue follow-up with urologist for ongoing management. Orders:   Urine Culture  Obstructive sleep apnea of adult Obstructive sleep apnea Sleep apnea may be contributing to secondary polycythemia. Unable to tolerate full-face CPAP masks due to claustrophobia. Exploring alternative CPAP options. - Continue exploring alternative CPAP options with sleep specialist.       Body mass index is 30.38 kg/m.SABRA     No orders of the defined types were placed in this encounter.   No orders of the defined types were placed in this encounter.    Follow-up: No follow-ups on file.  An After Visit Summary was printed and given to the patient.    I,Lauren M Auman,acting as a neurosurgeon for [employer].,have documented all relevant documentation on the behalf of Nola Angles, PA,as directed by  Nola Angles, PA while in the presence of Nola Angles, [name].    Nola Angles, [name] [facility] [phone]      [1]  Allergies Allergen Reactions   Ranexa [Ranolazine] Other  (See Comments)    Chest discomfort Acid reflux   Amoxil  [Amoxicillin ] Hives   Lipitor  [Atorvastatin ] Other (See Comments)    Myalgias   Pravachol  [Pravastatin ] Other (See Comments)    Myalgias    Rosuvastatin  Other (See Comments)    Myalgias    Statins Other (See Comments)    Myalgias    Eliquis  [Apixaban ] Rash and Other (See Comments)    Arthralgias Tachycardia    Xarelto  [Rivaroxaban ] Rash and Other (See Comments)    Arthralgias Tachycardia    "

## 2024-03-12 NOTE — Assessment & Plan Note (Addendum)
 Chronic insomnia with benzodiazepine dependence and withdrawal Chronic insomnia worsened by Klonopin  use. He wishes to discontinue due to side effects and withdrawal symptoms. Considering triazolam  or trazodone for sleep management. - Prescribed triazolam  for sleep management during Klonopin  withdrawal. - Provided one refill of Klonopin  for emergency use only. - Plan follow-up appointment to discuss transition to trazodone if triazolam  is ineffective. Orders:   triazolam  (HALCION ) 0.25 MG tablet; Take 1 tablet (0.25 mg total) by mouth at bedtime as needed for sleep.

## 2024-03-12 NOTE — Assessment & Plan Note (Addendum)
 Anxiety disorder Managed with Klonopin , which he wishes to discontinue. No significant anxiety symptoms since retirement, but anxiety in claustrophobic situations. - Provided one refill of Klonopin  for emergency use in claustrophobic situations. Orders:   clonazePAM  (KLONOPIN ) 1 MG tablet; TAKE 1 TABLET BY MOUTH TWICE A DAY AS NEEDED FOR ANXIETY

## 2024-03-12 NOTE — Assessment & Plan Note (Addendum)
 Osteoarthritis Chronic joint pain relieved by Kenalog  injections. - Administered Kenalog  injection for joint pain management. Orders:   triamcinolone  acetonide (KENALOG -40) injection 80 mg

## 2024-03-14 ENCOUNTER — Encounter: Payer: Self-pay | Admitting: Cardiology

## 2024-03-15 ENCOUNTER — Inpatient Hospital Stay: Attending: Hematology and Oncology

## 2024-03-15 ENCOUNTER — Inpatient Hospital Stay: Admitting: Hematology and Oncology

## 2024-03-15 ENCOUNTER — Inpatient Hospital Stay

## 2024-03-15 VITALS — BP 153/77 | HR 65 | Temp 98.6°F | Resp 17 | Ht 72.0 in | Wt 225.0 lb

## 2024-03-15 VITALS — BP 175/76 | HR 64 | Temp 98.0°F | Resp 18

## 2024-03-15 DIAGNOSIS — G4733 Obstructive sleep apnea (adult) (pediatric): Secondary | ICD-10-CM

## 2024-03-15 DIAGNOSIS — I2510 Atherosclerotic heart disease of native coronary artery without angina pectoris: Secondary | ICD-10-CM | POA: Diagnosis not present

## 2024-03-15 DIAGNOSIS — D751 Secondary polycythemia: Secondary | ICD-10-CM | POA: Insufficient documentation

## 2024-03-15 DIAGNOSIS — I10 Essential (primary) hypertension: Secondary | ICD-10-CM | POA: Diagnosis not present

## 2024-03-15 DIAGNOSIS — I4891 Unspecified atrial fibrillation: Secondary | ICD-10-CM | POA: Insufficient documentation

## 2024-03-15 LAB — URINE CULTURE

## 2024-03-15 LAB — CBC WITH DIFFERENTIAL (CANCER CENTER ONLY)
Abs Immature Granulocytes: 0.08 K/uL — ABNORMAL HIGH (ref 0.00–0.07)
Basophils Absolute: 0.1 K/uL (ref 0.0–0.1)
Basophils Relative: 1 %
Eosinophils Absolute: 0.2 K/uL (ref 0.0–0.5)
Eosinophils Relative: 2 %
HCT: 50.2 % (ref 39.0–52.0)
Hemoglobin: 17.1 g/dL — ABNORMAL HIGH (ref 13.0–17.0)
Immature Granulocytes: 1 %
Lymphocytes Relative: 24 %
Lymphs Abs: 2 K/uL (ref 0.7–4.0)
MCH: 28.4 pg (ref 26.0–34.0)
MCHC: 34.1 g/dL (ref 30.0–36.0)
MCV: 83.3 fL (ref 80.0–100.0)
Monocytes Absolute: 0.7 K/uL (ref 0.1–1.0)
Monocytes Relative: 9 %
Neutro Abs: 5.4 K/uL (ref 1.7–7.7)
Neutrophils Relative %: 63 %
Platelet Count: 213 K/uL (ref 150–400)
RBC: 6.03 MIL/uL — ABNORMAL HIGH (ref 4.22–5.81)
RDW: 16.5 % — ABNORMAL HIGH (ref 11.5–15.5)
WBC Count: 8.4 K/uL (ref 4.0–10.5)
nRBC: 0 % (ref 0.0–0.2)

## 2024-03-15 LAB — CMP (CANCER CENTER ONLY)
ALT: 30 U/L (ref 0–44)
AST: 26 U/L (ref 15–41)
Albumin: 4.3 g/dL (ref 3.5–5.0)
Alkaline Phosphatase: 81 U/L (ref 38–126)
Anion gap: 13 (ref 5–15)
BUN: 15 mg/dL (ref 8–23)
CO2: 18 mmol/L — ABNORMAL LOW (ref 22–32)
Calcium: 9.3 mg/dL (ref 8.9–10.3)
Chloride: 106 mmol/L (ref 98–111)
Creatinine: 0.98 mg/dL (ref 0.61–1.24)
GFR, Estimated: >60 mL/min
Glucose, Bld: 103 mg/dL — ABNORMAL HIGH (ref 70–99)
Potassium: 4.2 mmol/L (ref 3.5–5.1)
Sodium: 137 mmol/L (ref 135–145)
Total Bilirubin: 0.4 mg/dL (ref 0.0–1.2)
Total Protein: 7.4 g/dL (ref 6.5–8.1)

## 2024-03-15 LAB — FERRITIN: Ferritin: 47 ng/mL (ref 24–336)

## 2024-03-15 NOTE — Patient Instructions (Signed)

## 2024-03-15 NOTE — Progress Notes (Unsigned)
 " [facility] [phone]   [phone]  PROGRESS NOTE  Patient Care Team: Milon Cleaves, [name] as PCP - General (Physician Assistant) Mealor, Eulas BRAVO, MD as PCP - Electrophysiology (Cardiology) Jordan, Peter M, MD as PCP - Cardiology (Cardiology) Carolee Sherwood JONETTA DOUGLAS, MD as Consulting Physician (Urology) Goodrich, Callie E, PA-C as Physician Assistant (Cardiology) Malcolm Toribio DASEN, MD as Referring Physician (Neurology) Jeannetta Lonni ORN, MD as Consulting Physician (Rheumatology) Federico Norleen DASEN MADISON, MD as Consulting Physician (Hematology and Oncology) Louis Shove, MD as Consulting Physician (Neurosurgery)  Hematological/Oncological History # Polycythemia, secondary. Due to OSA/Exogenous Testosterone   06/11/2019: WBC 6.9, Hgb 15.5, MCV 91.4, Plt 219 04/01/2020: WBC 8.6, Hgb 18.4, MCV 87, Plt 219 08/03/2020: WBC 8.8, Hgb 19.3, MCV 88.7, Plt 187 09/09/2020: establish care with Dr. Federico. Hgb 18.2  Interval History:  Jacob Collier 69 y.o. male with medical history significant for secondary polycythemia who presents for a follow up visit. The patient's last visit was on 09/28/2023. In the interim since the last visit he has had no major change in his health.  On exam today Jacob Collier reports he has not been having any trouble with lightheadedness, dizziness, or shortness of breath.  He does feel like he is accumulating some extra water weight.  He is eager to get his OSA under good control and reports that he is a friend who is a nasal CPAP and he was hoping to get approved for this.  He reports he also cut his testosterone  dose in half to try to help.  He reports his energy levels remain quite good.  He is tolerating his blood thinner well with no bleeding, bruising, or dark stools.  He reports that he has good appetite.  Overall he feels well and is eager to get his polycythemia/OSA under good control.  A full 10 point ROS is otherwise negative.  MEDICAL HISTORY:   Past Medical History:  Diagnosis Date   Anxiety    Arthritis    Coronary artery disease    a. S/p DES to RCA in 01/2019 b. s/p CABG x4 (LIMA-LAD, reverse SVG-PDA, reverse SVG-OM1, reverse SVG-1st Diag) in 01/2021   Diverticulosis    Dyspnea    Hard of hearing    Hyperlipidemia    Hypertension    hx of elevation on lyrica , off med and now normal    Paroxysmal SVT (supraventricular tachycardia)    Post-op atrial fibrillation    Prostate cancer (HCC)    Wears glasses     SURGICAL HISTORY: Past Surgical History:  Procedure Laterality Date   A-FLUTTER ABLATION N/A 11/23/2023   Procedure: A-FLUTTER ABLATION;  Surgeon: Mealor, Eulas BRAVO, MD;  Location: [facility] INVASIVE CV LAB;  Service: Cardiovascular;  Laterality: N/A;   ANTERIOR CERVICAL DECOMP/DISCECTOMY FUSION N/A 09/03/2021   Procedure: CERVICAL FIVE-SIX, CERVICAL SIX-SEVEN ANTERIOR CERVICAL DECOMPRESSION/DISCECTOMY FUSION;  Surgeon: Louis Shove, MD;  Location: [facility] OR;  Service: Neurosurgery;  Laterality: N/A;   CARDIAC CATHETERIZATION     with 2 stents    CARDIOVERSION N/A 08/30/2023   Procedure: CARDIOVERSION;  Surgeon: Raford Riggs, MD;  Location: [facility] INVASIVE CV LAB;  Service: Cardiovascular;  Laterality: N/A;   COLECTOMY  05/2018   COLONOSCOPY     CORONARY ARTERY BYPASS GRAFT N/A 01/19/2021   Procedure: CORONARY ARTERY BYPASS GRAFTING (CABG), ON PUMP TIMES FOUR, USING LEFT INTERNAL MAMMARY ARTERY AND ENDOSOCPICALLY HARVESTED RIGHT GREATER SAPHENOUS VEIN;  Surgeon: Shyrl Linnie KIDD, MD;  Location: [facility] OR;  Service: Open  Heart Surgery;  Laterality: N/A;   CYSTOSCOPY WITH URETHRAL DILATATION N/A 01/14/2021   Procedure: CYSTOSCOPY WITH BALLOON  DILATATION;  Surgeon: Gaston Hamilton, MD;  Location: [facility] ORS;  Service: Urology;  Laterality: N/A;   EYE SURGERY  02/2020   bilaterl cataracts   IR THORACENTESIS RIGHT ASP PLEURAL SPACE W/IMG GUIDE  02/02/2021   LEFT HEART CATH AND CORONARY ANGIOGRAPHY N/A 12/24/2020   Procedure:  LEFT HEART CATH AND CORONARY ANGIOGRAPHY;  Surgeon: Jordan, Peter M, MD;  Location: [facility] INVASIVE CV LAB;  Service: Cardiovascular;  Laterality: N/A;   PROSTATECTOMY     SVT ABLATION N/A 10/20/2022   Procedure: SVT ABLATION;  Surgeon: Nancey Eulas BRAVO, MD;  Location: [facility] INVASIVE CV LAB;  Service: Cardiovascular;  Laterality: N/A;   TEE WITHOUT CARDIOVERSION N/A 01/19/2021   Procedure: TRANSESOPHAGEAL ECHOCARDIOGRAM (TEE);  Surgeon: Shyrl Linnie KIDD, MD;  Location: [facility] OR;  Service: Open Heart Surgery;  Laterality: N/A;   TOTAL HIP ARTHROPLASTY Left 11/10/2015   Procedure: LEFT TOTAL HIP ARTHROPLASTY ANTERIOR APPROACH;  Surgeon: Lonni CINDERELLA Poli, MD;  Location: [facility] OR;  Service: Orthopedics;  Laterality: Left;    SOCIAL HISTORY: Social History   Socioeconomic History   Marital status: Married    Spouse name: Not on file   Number of children: 0   Years of education: Not on file   Highest education level: Not on file  Occupational History   Occupation: Employed at [employer]  Tobacco Use   Smoking status: Never    Passive exposure: Never   Smokeless tobacco: Never  Vaping Use   Vaping status: Never Used  Substance and Sexual Activity   Alcohol use: Not Currently    Alcohol/week: 0.0 standard drinks of alcohol    Comment: rarely   Drug use: No   Sexual activity: Yes    Partners: Female    Birth control/protection: None  Other Topics Concern   Not on file  Social History Narrative   Not on file   Social Drivers of Health   Tobacco Use: Low Risk (03/12/2024)   Patient History    Smoking Tobacco Use: Never    Smokeless Tobacco Use: Never    Passive Exposure: Never  Financial Resource Strain: Low Risk (12/14/2023)   Overall Financial Resource Strain (CARDIA)    Difficulty of Paying Living Expenses: Not hard at all  Food Insecurity: No Food Insecurity (12/14/2023)   Epic    Worried About Programme Researcher, Broadcasting/film/video in the Last Year: Never true    Ran Out of Food in the Last  Year: Never true  Transportation Needs: No Transportation Needs (12/14/2023)   Epic    Lack of Transportation (Medical): No    Lack of Transportation (Non-Medical): No  Physical Activity: Insufficiently Active (12/14/2023)   Exercise Vital Sign    Days of Exercise per Week: 3 days    Minutes of Exercise per Session: 30 min  Stress: No Stress Concern Present (12/14/2023)   [employer] of Occupational Health - Occupational Stress Questionnaire    Feeling of Stress: Not at all  Social Connections: Moderately Isolated (12/14/2023)   Social Connection and Isolation Panel    Frequency of Communication with Friends and Family: More than three times a week    Frequency of Social Gatherings with Friends and Family: More than three times a week    Attends Religious Services: Never    Database Administrator or Organizations: No    Attends Banker Meetings: Never  Marital Status: Married  Catering Manager Violence: Not At Risk (12/14/2023)   Epic    Fear of Current or Ex-Partner: No    Emotionally Abused: No    Physically Abused: No    Sexually Abused: No  Depression (PHQ2-9): Low Risk (02/26/2024)   Depression (PHQ2-9)    PHQ-2 Score: 0  Alcohol Screen: Low Risk (12/14/2023)   Alcohol Screen    Last Alcohol Screening Score (AUDIT): 0  Housing: Low Risk (12/14/2023)   Epic    Unable to Pay for Housing in the Last Year: No    Number of Times Moved in the Last Year: 0    Homeless in the Last Year: No  Utilities: Not At Risk (12/14/2023)   Epic    Threatened with loss of utilities: No  Health Literacy: Adequate Health Literacy (12/14/2023)   B1300 Health Literacy    Frequency of need for help with medical instructions: Never    FAMILY HISTORY: Family History  Problem Relation Age of Onset   Alzheimer's disease Mother    Stroke Father    Diabetes Sister    Colon polyps Brother    Heart attack Maternal Grandmother    Cancer Maternal Uncle        prostate   Prostate  cancer Maternal Uncle    Cancer Maternal Uncle        prostate   Prostate cancer Maternal Uncle    Colon cancer Neg Hx    Esophageal cancer Neg Hx    Rectal cancer Neg Hx    Stomach cancer Neg Hx     ALLERGIES:  is allergic to ranexa [ranolazine], amoxil  [amoxicillin ], lipitor  [atorvastatin ], pravachol  [pravastatin ], rosuvastatin , statins, eliquis  [apixaban ], and xarelto  [rivaroxaban ].  MEDICATIONS:  Current Outpatient Medications  Medication Sig Dispense Refill   amLODipine  (NORVASC ) 5 MG tablet Take 1 tablet (5mg ) in the morning and 1/2 tablet (2.5mg ) in the evening. 90 tablet 2   aspirin  EC 81 MG tablet Take 81 mg by mouth daily. Swallow whole.     bisacodyl  (DULCOLAX) 10 MG suppository Place 1 suppository (10 mg total) rectally as needed for moderate constipation. 12 suppository 0   clonazePAM  (KLONOPIN ) 1 MG tablet TAKE 1 TABLET BY MOUTH TWICE A DAY AS NEEDED FOR ANXIETY 30 tablet 0   clopidogrel  (PLAVIX ) 75 MG tablet TAKE 1 TABLET BY MOUTH EVERY DAY 90 tablet 0   Evolocumab  (REPATHA  SURECLICK) 140 MG/ML SOAJ INJECT 140 MG INTO THE SKIN EVERY 14 (FOURTEEN) DAYS. 6 mL 3   ezetimibe  (ZETIA ) 10 MG tablet TAKE 1 TABLET BY MOUTH EVERY DAY 90 tablet 2   hydrocortisone -pramoxine (PROCTOFOAM-HC) rectal foam Place 1 applicator rectally 2 (two) times daily. (Patient taking differently: Place 1 applicator rectally 2 (two) times daily as needed for hemorrhoids.) 10 g 3   levofloxacin  (LEVAQUIN ) 500 MG tablet Take 1 tablet (500 mg total) by mouth daily for 10 days. 10 tablet 0   metoprolol  succinate (TOPROL -XL) 25 MG 24 hr tablet Take 0.5 tablets (12.5 mg total) by mouth at bedtime. 45 tablet 2   PRESCRIPTION MEDICATION 1 application  daily as needed (ED). [facility] - [place], [place] - [address]  TRIMIX PAPAVERINE/PHENTOLAMINE/ALPROSTADIL 30MG /1MG /10MCG/ML     triazolam  (HALCION ) 0.25 MG tablet Take 1 tablet (0.25 mg total) by mouth at bedtime as needed for sleep. 30 tablet 0    No current facility-administered medications for this visit.    REVIEW OF SYSTEMS:   Constitutional: ( - ) fevers, ( - )  chills , ( - ) night sweats Eyes: ( - ) blurriness of vision, ( - ) double vision, ( - ) watery eyes Ears, nose, mouth, throat, and face: ( - ) mucositis, ( - ) sore throat Respiratory: ( - ) cough, ( - ) dyspnea, ( - ) wheezes Cardiovascular: ( - ) palpitation, ( - ) chest discomfort, ( - ) lower extremity swelling Gastrointestinal:  ( - ) nausea, ( - ) heartburn, ( - ) change in bowel habits Skin: ( - ) abnormal skin rashes Lymphatics: ( - ) new lymphadenopathy, ( - ) easy bruising Neurological: ( - ) numbness, ( - ) tingling, ( - ) new weaknesses Behavioral/Psych: ( - ) mood change, ( - ) new changes  All other systems were reviewed with the patient and are negative.  PHYSICAL EXAMINATION:  Vitals:   03/15/24 1010 03/15/24 1017  BP: (!) 168/92 (!) 153/77  Pulse: 65   Resp: 17   Temp: 98.6 F (37 C)   SpO2: 98%     Filed Weights   03/15/24 1010  Weight: 225 lb (102.1 kg)   GENERAL: Well-appearing elderly Caucasian male, alert, no distress and comfortable SKIN: skin color, texture, turgor are normal, no rashes or significant lesions EYES: conjunctiva are pink and non-injected, sclera clear LUNGS: clear to auscultation and percussion with normal breathing effort HEART: regular rate & rhythm and no murmurs and no lower extremity edema Musculoskeletal: no cyanosis of digits and no clubbing  PSYCH: alert & oriented x 3, fluent speech NEURO: no focal motor/sensory deficits  LABORATORY DATA:  I have reviewed the data as listed    Latest Ref Rng & Units 03/15/2024    9:50 AM 02/26/2024    2:41 PM 02/16/2024    3:23 PM  CBC  WBC 4.0 - 10.5 K/uL 8.4  9.1  9.5   Hemoglobin 13.0 - 17.0 g/dL 82.8  82.1  82.5   Hematocrit 39.0 - 52.0 % 50.2  53.4  51.4   Platelets 150 - 400 K/uL 213  181  223        Latest Ref Rng & Units 03/15/2024    9:50 AM  02/26/2024    2:41 PM 02/16/2024    3:23 PM  CMP  Glucose 70 - 99 mg/dL 896  89  91   BUN 8 - 23 mg/dL 15  18  14    Creatinine 0.61 - 1.24 mg/dL 9.01  8.82  8.76   Sodium 135 - 145 mmol/L 137  138  138   Potassium 3.5 - 5.1 mmol/L 4.2  4.7  4.3   Chloride 98 - 111 mmol/L 106  106  106   CO2 22 - 32 mmol/L 18  22  21    Calcium  8.9 - 10.3 mg/dL 9.3  9.3  9.1   Total Protein 6.5 - 8.1 g/dL 7.4  7.2  7.1   Total Bilirubin 0.0 - 1.2 mg/dL 0.4  0.6  0.4   Alkaline Phos 38 - 126 U/L 81  87  84   AST 15 - 41 U/L 26  29  28    ALT 0 - 44 U/L 30  29  33     RADIOGRAPHIC STUDIES: No results found.    ASSESSMENT & PLAN Jacob Collier is a 69 y.o.  male with medical history significant for secondary polycythemia who presents for a follow up visit.   # Polycythemia, Secondary to OSA and Exogenous Testosterone  -- Findings at this time are most  consistent with a secondary polycythemia due to either obstructive sleep apnea or exogenous testosterone  injections. --negative JAK2 with reflex and BCR/ABL FISH panels at last visit.  --We discussed how it is controversial to perform phlebotomies outside the setting of polycythemia vera.  The patient reports that he feels much better and would be willing to talk with his sleep medicine doctor, in the interim we will perform phlebotomies as a temporizing measure to see if this improves some of his symptoms. PLAN: --Labs today show white blood cell 8.4, Hgb 17.1, MCV 83.3, HCT 50.2, Plt 213.   --HCT improved at 50.2, we will perform phlebotomy today.  Patient currently above goal. -- Patient has a diagnosis of OSA and has not started the CPAP machine.  He is trying to lose weight to improve his symptoms.  Additionally he is open to considering a nasal CPAP.   #Prostate Cancer --s/p prostatectomy in 2015, biochemical recurrence treated in 2018 with radiation therapy --currently follows with [facility] -- Discussed with patient that testosterone  shots  in the setting of history of prostate cancer is not advisable. -- Defer management to urology, but patient wanted us  to continue to monitoring for this disease would be happy to oblige.  Follow up: --2 weeks: labs only  --4 weeks: labs only --8 weeks: labs and follow up.   No orders of the defined types were placed in this encounter.   All questions were answered. The patient knows to call the clinic with any problems, questions or concerns.   I have spent a total of 25 minutes minutes of face-to-face and non-face-to-face time, preparing to see the patient, performing a medically appropriate examination, counseling and educating the patient, documenting clinical information in the electronic health record, independently interpreting results and communicating results to the patient, and care coordination.   Norleen IVAR Kidney, MD Department of Hematology/Oncology [facility] at [facility] Phone: [phone] Pager: [phone] Email: [email or web address]    03/19/2024 11:36 AM  "

## 2024-03-15 NOTE — Progress Notes (Signed)
 Luman L Fenoglio presents today for phlebotomy per MD orders. Phlebotomy procedure started at 1118 and ended at 1122. 16G phlebotomy kit utilized to the RAC. Procedure completed without incident. 529 grams removed. Patient declined to be observed for 30 minutes after procedure.  Patient declined offered snacks and beverages. Patient tolerated procedure well. IV needle removed intact. VSS at discharge. Patient ambulatory with spouse to lobby at discharge.

## 2024-03-16 ENCOUNTER — Ambulatory Visit: Payer: Self-pay | Admitting: Physician Assistant

## 2024-03-16 ENCOUNTER — Other Ambulatory Visit: Payer: Self-pay | Admitting: Physician Assistant

## 2024-03-16 DIAGNOSIS — N3001 Acute cystitis with hematuria: Secondary | ICD-10-CM

## 2024-03-16 MED ORDER — LEVOFLOXACIN 500 MG PO TABS: 500.0000 mg | ORAL_TABLET | Freq: Every day | ORAL | 0 refills | Status: AC

## 2024-03-17 NOTE — Assessment & Plan Note (Signed)
 Obstructive sleep apnea Sleep apnea may be contributing to secondary polycythemia. Unable to tolerate full-face CPAP masks due to claustrophobia. Exploring alternative CPAP options. - Continue exploring alternative CPAP options with sleep specialist.

## 2024-03-19 ENCOUNTER — Encounter: Payer: Self-pay | Admitting: Hematology and Oncology

## 2024-03-22 ENCOUNTER — Other Ambulatory Visit: Payer: Self-pay | Admitting: Cardiology

## 2024-03-22 MED ORDER — REPATHA SURECLICK 140 MG/ML ~~LOC~~ SOAJ: 140.0000 mg | SUBCUTANEOUS | 3 refills | Status: AC

## 2024-03-25 ENCOUNTER — Telehealth: Payer: Self-pay | Admitting: Hematology and Oncology

## 2024-03-25 NOTE — Telephone Encounter (Signed)
 Pt called regarding future appts

## 2024-03-28 ENCOUNTER — Telehealth: Payer: Self-pay | Admitting: Hematology and Oncology

## 2024-03-29 ENCOUNTER — Other Ambulatory Visit: Payer: Self-pay | Admitting: Hematology and Oncology

## 2024-03-29 ENCOUNTER — Inpatient Hospital Stay

## 2024-03-29 ENCOUNTER — Other Ambulatory Visit: Payer: Self-pay | Admitting: *Deleted

## 2024-03-29 ENCOUNTER — Telehealth: Payer: Self-pay | Admitting: *Deleted

## 2024-03-29 VITALS — BP 146/85 | HR 62 | Temp 98.1°F | Resp 18

## 2024-03-29 DIAGNOSIS — D751 Secondary polycythemia: Secondary | ICD-10-CM

## 2024-03-29 LAB — CBC WITH DIFFERENTIAL (CANCER CENTER ONLY)
Abs Immature Granulocytes: 0.03 K/uL (ref 0.00–0.07)
Basophils Absolute: 0.1 K/uL (ref 0.0–0.1)
Basophils Relative: 1 %
Eosinophils Absolute: 0.1 K/uL (ref 0.0–0.5)
Eosinophils Relative: 2 %
HCT: 50.7 % (ref 39.0–52.0)
Hemoglobin: 16.6 g/dL (ref 13.0–17.0)
Immature Granulocytes: 0 %
Lymphocytes Relative: 19 %
Lymphs Abs: 1.5 K/uL (ref 0.7–4.0)
MCH: 27.2 pg (ref 26.0–34.0)
MCHC: 32.7 g/dL (ref 30.0–36.0)
MCV: 83.1 fL (ref 80.0–100.0)
Monocytes Absolute: 0.7 K/uL (ref 0.1–1.0)
Monocytes Relative: 9 %
Neutro Abs: 5.5 K/uL (ref 1.7–7.7)
Neutrophils Relative %: 69 %
Platelet Count: 236 K/uL (ref 150–400)
RBC: 6.1 MIL/uL — ABNORMAL HIGH (ref 4.22–5.81)
RDW: 15.4 % (ref 11.5–15.5)
WBC Count: 8 K/uL (ref 4.0–10.5)
nRBC: 0 % (ref 0.0–0.2)

## 2024-03-29 LAB — CMP (CANCER CENTER ONLY)
ALT: 28 U/L (ref 0–44)
AST: 25 U/L (ref 15–41)
Albumin: 4.3 g/dL (ref 3.5–5.0)
Alkaline Phosphatase: 82 U/L (ref 38–126)
Anion gap: 12 (ref 5–15)
BUN: 20 mg/dL (ref 8–23)
CO2: 19 mmol/L — ABNORMAL LOW (ref 22–32)
Calcium: 9 mg/dL (ref 8.9–10.3)
Chloride: 106 mmol/L (ref 98–111)
Creatinine: 1.11 mg/dL (ref 0.61–1.24)
GFR, Estimated: >60 mL/min
Glucose, Bld: 95 mg/dL (ref 70–99)
Potassium: 4.7 mmol/L (ref 3.5–5.1)
Sodium: 137 mmol/L (ref 135–145)
Total Bilirubin: 0.8 mg/dL (ref 0.0–1.2)
Total Protein: 7.1 g/dL (ref 6.5–8.1)

## 2024-03-29 LAB — FERRITIN: Ferritin: 40 ng/mL (ref 24–336)

## 2024-03-29 NOTE — Telephone Encounter (Signed)
 Called to see pt in main lobby as he was c/o SOB and chest pressure. Met with pt. He states he has had progressive SOB over the last few months and occasional chest pressure. No radiating pain, no diaphoresis. Pt does have a h/o quadruple bypass. Pt to have labs and possible phlebotomy today. Instructed wife to call his cardiologist and get their recommendations. He is not in any distress. Able to walk, talk and stand to describe his s/s.  VS obtained 132/79 left arm HR 60/reg 02 sat 98% T=98.5  Dr. Federico made aware.

## 2024-03-29 NOTE — Progress Notes (Signed)
 Jacob Collier presents today for phlebotomy per MD orders. Phlebotomy procedure started at 1500 and ended at 1507. 507 grams removed. 16G pleb kit used to R AC. Patient  declined to stay the 30 minute observation period after procedure. Patient tolerated procedure well. IV needle removed intact.

## 2024-04-09 ENCOUNTER — Ambulatory Visit: Admitting: Physician Assistant

## 2024-04-09 ENCOUNTER — Other Ambulatory Visit: Payer: Self-pay | Admitting: Physician Assistant

## 2024-04-09 DIAGNOSIS — F5101 Primary insomnia: Secondary | ICD-10-CM

## 2024-04-09 DIAGNOSIS — I2510 Atherosclerotic heart disease of native coronary artery without angina pectoris: Secondary | ICD-10-CM

## 2024-04-09 DIAGNOSIS — C61 Malignant neoplasm of prostate: Secondary | ICD-10-CM

## 2024-04-09 MED ORDER — TRAZODONE HCL 50 MG PO TABS: 50.0000 mg | ORAL_TABLET | Freq: Every evening | ORAL | 0 refills | Status: AC | PRN

## 2024-04-12 ENCOUNTER — Inpatient Hospital Stay

## 2024-04-12 ENCOUNTER — Inpatient Hospital Stay: Attending: Hematology and Oncology

## 2024-04-12 VITALS — BP 133/64 | HR 65 | Temp 98.5°F | Resp 18

## 2024-04-12 DIAGNOSIS — D751 Secondary polycythemia: Secondary | ICD-10-CM

## 2024-04-12 LAB — CMP (CANCER CENTER ONLY)
ALT: 30 U/L (ref 0–44)
AST: 28 U/L (ref 15–41)
Albumin: 4.4 g/dL (ref 3.5–5.0)
Alkaline Phosphatase: 79 U/L (ref 38–126)
Anion gap: 11 (ref 5–15)
BUN: 16 mg/dL (ref 8–23)
CO2: 22 mmol/L (ref 22–32)
Calcium: 9.1 mg/dL (ref 8.9–10.3)
Chloride: 105 mmol/L (ref 98–111)
Creatinine: 1.13 mg/dL (ref 0.61–1.24)
GFR, Estimated: >60 mL/min
Glucose, Bld: 97 mg/dL (ref 70–99)
Potassium: 4.8 mmol/L (ref 3.5–5.1)
Sodium: 138 mmol/L (ref 135–145)
Total Bilirubin: 0.6 mg/dL (ref 0.0–1.2)
Total Protein: 7 g/dL (ref 6.5–8.1)

## 2024-04-12 LAB — CBC WITH DIFFERENTIAL (CANCER CENTER ONLY)
Abs Immature Granulocytes: 0.02 10*3/uL (ref 0.00–0.07)
Basophils Absolute: 0 10*3/uL (ref 0.0–0.1)
Basophils Relative: 0 %
Eosinophils Absolute: 0.1 10*3/uL (ref 0.0–0.5)
Eosinophils Relative: 1 %
HCT: 47 % (ref 39.0–52.0)
Hemoglobin: 15.8 g/dL (ref 13.0–17.0)
Immature Granulocytes: 0 %
Lymphocytes Relative: 18 %
Lymphs Abs: 1.4 10*3/uL (ref 0.7–4.0)
MCH: 28.1 pg (ref 26.0–34.0)
MCHC: 33.6 g/dL (ref 30.0–36.0)
MCV: 83.5 fL (ref 80.0–100.0)
Monocytes Absolute: 0.5 10*3/uL (ref 0.1–1.0)
Monocytes Relative: 7 %
Neutro Abs: 5.8 10*3/uL (ref 1.7–7.7)
Neutrophils Relative %: 74 %
Platelet Count: 244 10*3/uL (ref 150–400)
RBC: 5.63 MIL/uL (ref 4.22–5.81)
RDW: 14.9 % (ref 11.5–15.5)
WBC Count: 7.9 10*3/uL (ref 4.0–10.5)
nRBC: 0 % (ref 0.0–0.2)

## 2024-04-12 LAB — FERRITIN: Ferritin: 37 ng/mL (ref 24–336)

## 2024-04-12 NOTE — Patient Instructions (Signed)

## 2024-04-12 NOTE — Progress Notes (Signed)
 Jacob Collier presents today for phlebotomy per MD orders. Phlebotomy procedure started at 1627 and ended at 1633. 516 grams removed with a standard 16G phlebotomy set. Patient observed for 5 minutes post procedure. Pt declined staying for full 30 wait.  Patient tolerated procedure well VSS. IV needle removed intact.

## 2024-04-26 ENCOUNTER — Inpatient Hospital Stay: Admitting: Physician Assistant

## 2024-04-26 ENCOUNTER — Inpatient Hospital Stay

## 2024-12-19 ENCOUNTER — Ambulatory Visit
